# Patient Record
Sex: Female | Born: 1990 | Race: Black or African American | Hispanic: No | Marital: Single | Smoking: Never smoker
Health system: Southern US, Community
[De-identification: ages and names within clinical notes are randomized; demographics above are authoritative.]

## PROBLEM LIST (undated history)

## (undated) DIAGNOSIS — K219 Gastro-esophageal reflux disease without esophagitis: Secondary | ICD-10-CM

## (undated) DIAGNOSIS — G610 Guillain-Barre syndrome: Secondary | ICD-10-CM

## (undated) DIAGNOSIS — F32A Depression, unspecified: Secondary | ICD-10-CM

## (undated) DIAGNOSIS — F329 Major depressive disorder, single episode, unspecified: Secondary | ICD-10-CM

## (undated) DIAGNOSIS — E669 Obesity, unspecified: Secondary | ICD-10-CM

## (undated) DIAGNOSIS — F419 Anxiety disorder, unspecified: Secondary | ICD-10-CM

---

## 2009-09-04 ENCOUNTER — Emergency Department (HOSPITAL_COMMUNITY): Admission: EM | Admit: 2009-09-04 | Discharge: 2009-09-04 | Payer: Self-pay | Admitting: Emergency Medicine

## 2011-12-30 ENCOUNTER — Encounter (HOSPITAL_COMMUNITY): Payer: Self-pay | Admitting: Emergency Medicine

## 2011-12-30 ENCOUNTER — Emergency Department (HOSPITAL_COMMUNITY)
Admission: EM | Admit: 2011-12-30 | Discharge: 2011-12-30 | Disposition: A | Discharge status: Home / Self Care | Payer: Self-pay | Attending: Emergency Medicine | Admitting: Emergency Medicine

## 2011-12-30 DIAGNOSIS — J060 Acute laryngopharyngitis: Secondary | ICD-10-CM

## 2011-12-30 DIAGNOSIS — R070 Pain in throat: Secondary | ICD-10-CM | POA: Insufficient documentation

## 2011-12-30 MED ORDER — LORATADINE 10 MG PO TABS: 10.0000 mg | ORAL_TABLET | Freq: Every day | ORAL | Status: DC

## 2011-12-30 MED ORDER — DIPHENHYDRAMINE HCL 25 MG PO TABS: 50.0000 mg | ORAL_TABLET | Freq: Every evening | ORAL | Status: DC | PRN

## 2011-12-30 MED ORDER — FAMOTIDINE 20 MG PO TABS: 20.0000 mg | ORAL_TABLET | Freq: Two times a day (BID) | ORAL | Status: DC

## 2011-12-30 NOTE — ED Notes (Signed)
Pt c/o sore throat x 3 weeks; pt denies cold or fever

## 2011-12-30 NOTE — ED Provider Notes (Signed)
History  This chart was scribed for Hurman Horn, MD by Erskine Emery. This patient was seen in room TR08C/TR08C and the patient's care was started at 18:30.   CSN: [id number]  Arrival date & time 12/30/11  1539   None     Chief Complaint  Patient presents with  . Sore Throat    (Consider location/radiation/quality/duration/timing/severity/associated sxs/prior treatment) The history is provided by the patient. No language interpreter was used.  Carla Little is a 21 y.o. female who presents to the Emergency Department complaining of a burning sore throat for the past 3 weeks. Pt reports some associated difficulty swallowing, mild nasal congestion, and hoarse voice but denies any cold, fever, abdominal pain, emesis, chest pain, back pain, or extremity pain. Pt reports trying some OTC medications with no relief from symptoms. Pt reports she is otherwise healthy and on no medications.  Pt has no PCP.  History reviewed. No pertinent past medical history.  History reviewed. No pertinent past surgical history.  History reviewed. No pertinent family history.  History  Substance Use Topics  . Smoking status: Never Smoker   . Smokeless tobacco: Not on file  . Alcohol Use: No    OB History    Grav Para Term Preterm Abortions TAB SAB Ect Mult Living                  Review of Systems 10 Systems reviewed and are negative for acute change except as noted in the HPI.   Allergies  Review of patient's allergies indicates no known allergies.  Home Medications   Current Outpatient Rx  Name Route Sig Dispense Refill  . DIPHENHYDRAMINE HCL 25 MG PO TABS Oral Take 2 tablets (50 mg total) by mouth at bedtime as needed. 15 tablet 0  . FAMOTIDINE 20 MG PO TABS Oral Take 1 tablet (20 mg total) by mouth 2 (two) times daily. 30 tablet 0  . LORATADINE 10 MG PO TABS Oral Take 1 tablet (10 mg total) by mouth daily. One po daily x 15 days 15 tablet 0    BP 123/65  Pulse 84  Temp 98.1  F (36.7 C) (Oral)  Resp 16  SpO2 100%  Physical Exam  Nursing note and vitals reviewed. Constitutional:       Awake, alert, nontoxic appearance.  HENT:  Head: Atraumatic.  Mouth/Throat: Oropharynx is clear and moist. No oropharyngeal exudate.       Large tonsils that are not inflamed.   Eyes: Right eye exhibits no discharge. Left eye exhibits no discharge.  Neck: Neck supple.  Pulmonary/Chest: Effort normal and breath sounds normal. She has no wheezes. She has no rales. She exhibits no tenderness.  Abdominal: Soft. There is no tenderness. There is no rebound.  Musculoskeletal: She exhibits no tenderness.       Baseline ROM, no obvious new focal weakness.  Neurological:       Mental status and motor strength appears baseline for patient and situation.  Skin: No rash noted.  Psychiatric: She has a normal mood and affect.    ED Course  Procedures (including critical care time) DIAGNOSTIC STUDIES: Oxygen Saturation is 100% on room air, normal by my interpretation.    COORDINATION OF CARE: 18:30--Patient / Family / Caregiver informed of clinical course, understand medical decision-making process, and agree with plan.   Results for orders placed during the hospital encounter of 12/30/11  RAPID STREP SCREEN      Component Value Range   Streptococcus, Group  A Screen (Direct) NEGATIVE  NEGATIVE       MDM   Patient / Family / Caregiver informed of clinical course, understand medical decision-making process, and agree with plan. I personally performed the services described in this documentation, which was scribed in my presence. The recorded information has been reviewed and considered. I doubt any other EMC precluding discharge at this time.  Hurman Horn, MD 12/31/11 [phone]

## 2013-05-25 ENCOUNTER — Encounter (HOSPITAL_COMMUNITY): Payer: Self-pay | Admitting: Emergency Medicine

## 2013-05-25 ENCOUNTER — Emergency Department (HOSPITAL_COMMUNITY)
Admission: EM | Admit: 2013-05-25 | Discharge: 2013-05-26 | Disposition: A | Discharge status: Home / Self Care | Payer: Self-pay | Attending: Emergency Medicine | Admitting: Emergency Medicine

## 2013-05-25 DIAGNOSIS — Z8719 Personal history of other diseases of the digestive system: Secondary | ICD-10-CM | POA: Insufficient documentation

## 2013-05-25 DIAGNOSIS — S39012A Strain of muscle, fascia and tendon of lower back, initial encounter: Secondary | ICD-10-CM

## 2013-05-25 DIAGNOSIS — R296 Repeated falls: Secondary | ICD-10-CM | POA: Insufficient documentation

## 2013-05-25 DIAGNOSIS — IMO0002 Reserved for concepts with insufficient information to code with codable children: Secondary | ICD-10-CM | POA: Insufficient documentation

## 2013-05-25 DIAGNOSIS — Z79899 Other long term (current) drug therapy: Secondary | ICD-10-CM | POA: Insufficient documentation

## 2013-05-25 DIAGNOSIS — Y939 Activity, unspecified: Secondary | ICD-10-CM | POA: Insufficient documentation

## 2013-05-25 DIAGNOSIS — Y990 Civilian activity done for income or pay: Secondary | ICD-10-CM | POA: Insufficient documentation

## 2013-05-25 DIAGNOSIS — Y929 Unspecified place or not applicable: Secondary | ICD-10-CM | POA: Insufficient documentation

## 2013-05-25 DIAGNOSIS — S8000XA Contusion of unspecified knee, initial encounter: Secondary | ICD-10-CM | POA: Insufficient documentation

## 2013-05-25 HISTORY — DX: Obesity, unspecified: E66.9

## 2013-05-25 HISTORY — DX: Gastro-esophageal reflux disease without esophagitis: K21.9

## 2013-05-25 NOTE — ED Notes (Signed)
Pt states she needs to be evaluated for work.

## 2013-05-25 NOTE — ED Notes (Signed)
Pt. slipped at work [employer]( [employer]) and landed on her knees last Saturday reports mild  pain at bilateral knees . Ambulatory . No LOC .

## 2013-05-26 MED ORDER — HYDROCODONE-ACETAMINOPHEN 5-325 MG PO TABS: 1.0000 | ORAL_TABLET | ORAL | Status: DC | PRN

## 2013-05-26 MED ORDER — IBUPROFEN 800 MG PO TABS: 800.0000 mg | ORAL_TABLET | Freq: Three times a day (TID) | ORAL | Status: DC

## 2013-05-26 NOTE — Discharge Instructions (Signed)
Take vicodin as prescribed for severe pain.   Do not drive within four hours of taking this medication (may cause drowsiness or confusion).  Take up to 800mg  of ibuprofen three times a day for the next 3-4 days (take with food).  Ice 3 times a day for 15-20 minutes.  Activity as tolerated.  You should return to the ER if you develop change in or worsening of pain, fever (100.5 degrees or greater), inability to walk due to leg weakness or loss of control of bladder/bowels.

## 2013-05-26 NOTE — ED Provider Notes (Signed)
CSN: [id number]     Arrival date & time 05/25/13  2028 History   First MD Initiated Contact with Patient 05/25/13 2354     Chief Complaint  Patient presents with  . Knee Pain     (Consider location/radiation/quality/duration/timing/severity/associated sxs/prior Treatment) HPI History provided by pt.   Pt slipped at work 3 days ago and landed on her knees.  Did not hit her head.  Has had persistent "soreness" in knees as well as mid and low back ever since.  Back pain is non-radiating and no associated fever, bowel/bladder dysfunction, extremity weakness/paresthesias.  She is ambulatory.  No relief w/ 1200mg  advil.  Pertinent PMH includes morbid obesity. Past Medical History  Diagnosis Date  . Obesity   . GERD (gastroesophageal reflux disease)    History reviewed. No pertinent past surgical history. No family history on file. History  Substance Use Topics  . Smoking status: Never Smoker   . Smokeless tobacco: Not on file  . Alcohol Use: No   OB History   Grav Para Term Preterm Abortions TAB SAB Ect Mult Living                 Review of Systems  All other systems reviewed and are negative.      Allergies  Review of patient's allergies indicates no known allergies.  Home Medications   Current Outpatient Rx  Name  Route  Sig  Dispense  Refill  . ibuprofen (ADVIL,MOTRIN) 200 MG tablet   Oral   Take 1,200 mg by mouth every 6 (six) hours as needed for moderate pain.         Marland Kitchen. HYDROcodone-acetaminophen (NORCO/VICODIN) 5-325 MG per tablet   Oral   Take 1 tablet by mouth every 4 (four) hours as needed for moderate pain.   12 tablet   0   . ibuprofen (ADVIL,MOTRIN) 800 MG tablet   Oral   Take 1 tablet (800 mg total) by mouth 3 (three) times daily.   12 tablet   0    BP 135/76  Pulse 81  Temp(Src) 98 F (36.7 C) (Oral)  Resp 14  Ht 5\' 4"  (1.626 m)  Wt 350 lb (158.759 kg)  BMI 60.05 kg/m2  SpO2 100%  LMP 04/26/2013 Physical Exam  Nursing note and vitals  reviewed. Constitutional: She is oriented to person, place, and time. She appears well-developed and well-nourished.  Morbidly obese  HENT:  Head: Normocephalic and atraumatic.  Eyes:  Normal appearance  Neck: Normal range of motion.  Cardiovascular: Normal rate and regular rhythm.   Pulmonary/Chest: Effort normal and breath sounds normal.  Genitourinary:  No CVA ttp  Musculoskeletal:  Mild tendernessmid-line and bilateral thoracic and lumbar paraspinal ttp. Full active ROM of LE but pain in posterior thighs w/ knee flexion.  5/5 grip and hip abduction/adduction and ankle plantar/dorsiflexion strength.   Nml bicep and patellar reflexes.  Distal sensation intact and four extremities.  2+ radial and DP pulses.  Ambulates w/out diffulty.   Neurological: She is alert and oriented to person, place, and time.  Skin: Skin is warm and dry. No rash noted.  Psychiatric: She has a normal mood and affect. Her behavior is normal.    ED Course  Procedures (including critical care time) Labs Review Labs Reviewed - No data to display Imaging Review No results found.   EKG Interpretation None      MDM   Final diagnoses:  Back strain  Contusion of knee    Morbidly obese but  otherwise healthy 22yo F presents w/ bilateral knee as well as low back pain s/p mechanical fall onto knees 3 days ago.  Employer requested a doctor's note because she called out yesterday.  Exam most consistent w/ back strain and bilateral knee contusions; afebrile, NAD, mild mid-line and paraspinal lumbar/thoracic tenderness, no NV deficits of extremities, ambulatory.  Will treat symptomatically w/ NSAID, ice/heat, rest and short course vicodin (has not had relief w/ advil at home and pt seems very reasonable).  Return precautions discussed.     Otilio Miu, PA-C 05/26/13 [phone]

## 2013-05-26 NOTE — ED Provider Notes (Signed)
Medical screening examination/treatment/procedure(s) were performed by non-physician practitioner and as supervising physician I was immediately available for consultation/collaboration.  Yifan Auker E Early Steel, MD 05/26/13 1939 

## 2013-05-26 NOTE — ED Notes (Signed)
PA-C at bedside 

## 2013-05-26 NOTE — ED Notes (Signed)
Pt ambulated to restroom with a steady gait

## 2013-07-16 ENCOUNTER — Encounter (HOSPITAL_COMMUNITY): Payer: Self-pay | Admitting: Emergency Medicine

## 2013-07-16 ENCOUNTER — Emergency Department (HOSPITAL_COMMUNITY)
Admission: EM | Admit: 2013-07-16 | Discharge: 2013-07-16 | Disposition: A | Discharge status: Home / Self Care | Payer: Self-pay | Attending: Emergency Medicine | Admitting: Emergency Medicine

## 2013-07-16 ENCOUNTER — Emergency Department (HOSPITAL_COMMUNITY): Payer: Self-pay

## 2013-07-16 DIAGNOSIS — K219 Gastro-esophageal reflux disease without esophagitis: Secondary | ICD-10-CM | POA: Insufficient documentation

## 2013-07-16 DIAGNOSIS — M25476 Effusion, unspecified foot: Secondary | ICD-10-CM | POA: Insufficient documentation

## 2013-07-16 DIAGNOSIS — M25473 Effusion, unspecified ankle: Secondary | ICD-10-CM | POA: Insufficient documentation

## 2013-07-16 DIAGNOSIS — M79673 Pain in unspecified foot: Secondary | ICD-10-CM

## 2013-07-16 DIAGNOSIS — M25579 Pain in unspecified ankle and joints of unspecified foot: Secondary | ICD-10-CM | POA: Insufficient documentation

## 2013-07-16 MED ORDER — HYDROCODONE-ACETAMINOPHEN 5-325 MG PO TABS: 1.0000 | ORAL_TABLET | ORAL | Status: DC | PRN

## 2013-07-16 MED ORDER — IBUPROFEN 600 MG PO TABS: 600.0000 mg | ORAL_TABLET | Freq: Four times a day (QID) | ORAL | Status: DC | PRN

## 2013-07-16 NOTE — ED Provider Notes (Signed)
CSN: [id number]     Arrival date & time 07/16/13  1651 History  This chart was scribed for non-physician practitioner, Earley FavorGail Solyana Nonaka, FNP working with Shon Batonourtney F Horton, MD by Greggory StallionKayla Andersen, ED scribe. This patient was seen in room TR05C/TR05C and the patient's care was started at 5:07 PM.   Chief Complaint  Patient presents with  . Ankle Pain   The history is provided by the patient. No language interpreter was used.   HPI Comments: Carla Little is a 23 y.o. female who presents to the Emergency Department complaining of gradual onset left foot pain with associated swelling that started one week ago. Denies injury. Bearing weight worsens the pain. Pt has taken Advil, done warm and cold compresses and epsom salt soaks with no relief. She is on her feet 8-10 hours per day at work. LMP was one month ago but states she is not sexually active.   Past Medical History  Diagnosis Date  . Obesity   . GERD (gastroesophageal reflux disease)    History reviewed. No pertinent past surgical history. No family history on file. History  Substance Use Topics  . Smoking status: Never Smoker   . Smokeless tobacco: Not on file  . Alcohol Use: No   OB History   Grav Para Term Preterm Abortions TAB SAB Ect Mult Living                 Review of Systems  Musculoskeletal: Positive for arthralgias and joint swelling.  Skin: Negative for color change, rash and wound.  Neurological: Negative for numbness.  All other systems reviewed and are negative.  Allergies  Review of patient's allergies indicates no known allergies.  Home Medications   Prior to Admission medications   Medication Sig Start Date End Date Taking? Authorizing Provider  HYDROcodone-acetaminophen (NORCO/VICODIN) 5-325 MG per tablet Take 1 tablet by mouth every 4 (four) hours as needed for moderate pain. 05/26/13   Arie Sabinaatherine E Schinlever, PA-C  ibuprofen (ADVIL,MOTRIN) 200 MG tablet Take 1,200 mg by mouth every 6 (six) hours as needed  for moderate pain.    Historical Provider, MD  ibuprofen (ADVIL,MOTRIN) 800 MG tablet Take 1 tablet (800 mg total) by mouth 3 (three) times daily. 05/26/13   Arie Sabinaatherine E Schinlever, PA-C   BP 137/77  Pulse 97  Temp(Src) 97.7 F (36.5 C) (Oral)  Resp 18  Ht 5\' 4"  (1.626 m)  Wt 357 lb (161.934 kg)  BMI 61.25 kg/m2  SpO2 100%  LMP 06/15/2013  Physical Exam  Nursing note and vitals reviewed. Constitutional: She is oriented to person, place, and time. She appears well-developed and well-nourished. No distress.  HENT:  Head: Normocephalic and atraumatic.  Eyes: EOM are normal.  Neck: Neck supple. No tracheal deviation present.  Cardiovascular: Normal rate.   Pulmonary/Chest: Effort normal. No respiratory distress.  Musculoskeletal: Normal range of motion. She exhibits tenderness.  Lateral left foot tenderness and swelling. No discoloration. No deformity. No bruising.   Neurological: She is alert and oriented to person, place, and time.  Skin: Skin is warm and dry. No erythema.  Psychiatric: She has a normal mood and affect. Her behavior is normal.    ED Course  Procedures (including critical care time)  DIAGNOSTIC STUDIES: Oxygen Saturation is 100% on RA, normal by my interpretation.    COORDINATION OF CARE: 5:08 PM-Discussed treatment plan which includes xray with pt at bedside and pt agreed to plan.   Labs Review Labs Reviewed - No data to display  Imaging Review Dg Foot Complete Left  07/16/2013   CLINICAL DATA:  Ankle pain  EXAM: LEFT FOOT - COMPLETE 3+ VIEW  COMPARISON:  None.  FINDINGS: No acute fracture.  No dislocation.  IMPRESSION: No acute bony pathology.   Electronically Signed   By: Maryclare BeanArt  Hoss M.D.   On: 07/16/2013 20:10     EKG Interpretation None      MDM  Obvious site of injury.  This is a morbidly obese 23 year old female, who most likely has a stress fracture in her foot, as she works 8-12 hours a day without breaks.  She does wear a supportive sneaker.   Denies any, injury, we'll x-ray X-ray is negative for any fracture.  Vision has been given prescriptions for ibuprofen, and Vicodin for severe pain in an Ace bandage.  She has been referred to orthopedics.  If she persistently has pain after the treatment. Final diagnoses:  Foot pain      I personally performed the services described in this documentation, which was scribed in my presence. The recorded information has been reviewed and is accurate.  Arman FilterGail K Dacie Mandel, NP 07/16/13 2024

## 2013-07-16 NOTE — ED Notes (Signed)
Pt reports swelling/pain to left ankle x 1 week. Denies injury. Pt ambulatory to triage. Pulses intact.

## 2013-07-16 NOTE — Discharge Instructions (Signed)
There are no Dr. Mylo RedBehrens on your x-ray, even placed in an Ace bandage, and given the prescription strength ibuprofen, to take on a regular basis, and prescription for Vicodin and, you can take as needed.  For severe, pain.  We've also been given a referral to orthopedics.  If you have persistent pain.  Past treatment period

## 2013-07-17 NOTE — ED Provider Notes (Signed)
Medical screening examination/treatment/procedure(s) were performed by non-physician practitioner and as supervising physician I was immediately available for consultation/collaboration.   EKG Interpretation None       Shon Batonourtney F Horton, MD 07/17/13 0100

## 2013-10-27 ENCOUNTER — Emergency Department (HOSPITAL_COMMUNITY)
Admission: EM | Admit: 2013-10-27 | Discharge: 2013-10-27 | Disposition: A | Discharge status: Home / Self Care | Payer: Self-pay | Attending: Emergency Medicine | Admitting: Emergency Medicine

## 2013-10-27 ENCOUNTER — Encounter (HOSPITAL_COMMUNITY): Payer: Self-pay | Admitting: Emergency Medicine

## 2013-10-27 DIAGNOSIS — A088 Other specified intestinal infections: Secondary | ICD-10-CM | POA: Insufficient documentation

## 2013-10-27 DIAGNOSIS — A084 Viral intestinal infection, unspecified: Secondary | ICD-10-CM

## 2013-10-27 DIAGNOSIS — R109 Unspecified abdominal pain: Secondary | ICD-10-CM | POA: Insufficient documentation

## 2013-10-27 DIAGNOSIS — Z3202 Encounter for pregnancy test, result negative: Secondary | ICD-10-CM | POA: Insufficient documentation

## 2013-10-27 DIAGNOSIS — E669 Obesity, unspecified: Secondary | ICD-10-CM | POA: Insufficient documentation

## 2013-10-27 LAB — URINALYSIS, ROUTINE W REFLEX MICROSCOPIC
BILIRUBIN URINE: NEGATIVE
Glucose, UA: NEGATIVE mg/dL
HGB URINE DIPSTICK: NEGATIVE
KETONES UR: NEGATIVE mg/dL
Leukocytes, UA: NEGATIVE
Nitrite: NEGATIVE
PH: 6 (ref 5.0–8.0)
Protein, ur: NEGATIVE mg/dL
SPECIFIC GRAVITY, URINE: 1.01 (ref 1.005–1.030)
Urobilinogen, UA: 1 mg/dL (ref 0.0–1.0)

## 2013-10-27 LAB — COMPREHENSIVE METABOLIC PANEL
ALBUMIN: 3.4 g/dL — AB (ref 3.5–5.2)
ALK PHOS: 59 U/L (ref 39–117)
ALT: 9 U/L (ref 0–35)
AST: 16 U/L (ref 0–37)
Anion gap: 11 (ref 5–15)
BILIRUBIN TOTAL: 1.4 mg/dL — AB (ref 0.3–1.2)
BUN: 5 mg/dL — ABNORMAL LOW (ref 6–23)
CHLORIDE: 102 meq/L (ref 96–112)
CO2: 27 mEq/L (ref 19–32)
Calcium: 9 mg/dL (ref 8.4–10.5)
Creatinine, Ser: 0.79 mg/dL (ref 0.50–1.10)
GFR calc non Af Amer: >90 mL/min (ref >90)
GLUCOSE: 89 mg/dL (ref 70–99)
POTASSIUM: 3.3 meq/L — AB (ref 3.7–5.3)
SODIUM: 140 meq/L (ref 137–147)
TOTAL PROTEIN: 7.8 g/dL (ref 6.0–8.3)

## 2013-10-27 LAB — CBC WITH DIFFERENTIAL/PLATELET
Basophils Absolute: 0 10*3/uL (ref 0.0–0.1)
Basophils Relative: 0 % (ref 0–1)
EOS ABS: 0.1 10*3/uL (ref 0.0–0.7)
Eosinophils Relative: 1 % (ref 0–5)
HCT: 32.7 % — ABNORMAL LOW (ref 36.0–46.0)
HEMOGLOBIN: 10.4 g/dL — AB (ref 12.0–15.0)
LYMPHS ABS: 2.4 10*3/uL (ref 0.7–4.0)
LYMPHS PCT: 36 % (ref 12–46)
MCH: 27.9 pg (ref 26.0–34.0)
MCHC: 31.8 g/dL (ref 30.0–36.0)
MCV: 87.7 fL (ref 78.0–100.0)
MONOS PCT: 12 % (ref 3–12)
Monocytes Absolute: 0.8 10*3/uL (ref 0.1–1.0)
NEUTROS ABS: 3.5 10*3/uL (ref 1.7–7.7)
NEUTROS PCT: 51 % (ref 43–77)
PLATELETS: 342 10*3/uL (ref 150–400)
RBC: 3.73 MIL/uL — AB (ref 3.87–5.11)
RDW: 15.6 % — ABNORMAL HIGH (ref 11.5–15.5)
WBC: 6.8 10*3/uL (ref 4.0–10.5)

## 2013-10-27 LAB — POC URINE PREG, ED: Preg Test, Ur: NEGATIVE

## 2013-10-27 MED ORDER — PROMETHAZINE HCL 25 MG/ML IJ SOLN
12.5000 mg | Freq: Once | INTRAMUSCULAR | Status: AC | PRN
  Administered 2013-10-27: 12.5 mg via INTRAVENOUS
  Filled 2013-10-27: qty 1

## 2013-10-27 MED ORDER — SODIUM CHLORIDE 0.9 % IV BOLUS (SEPSIS)
1000.0000 mL | Freq: Once | INTRAVENOUS | Status: AC
  Administered 2013-10-27: 1000 mL via INTRAVENOUS

## 2013-10-27 MED ORDER — MORPHINE SULFATE 4 MG/ML IJ SOLN
4.0000 mg | Freq: Once | INTRAMUSCULAR | Status: AC
  Administered 2013-10-27: 4 mg via INTRAVENOUS
  Filled 2013-10-27: qty 1

## 2013-10-27 MED ORDER — POTASSIUM CHLORIDE CRYS ER 20 MEQ PO TBCR
40.0000 meq | EXTENDED_RELEASE_TABLET | Freq: Once | ORAL | Status: AC
  Administered 2013-10-27: 40 meq via ORAL
  Filled 2013-10-27: qty 2

## 2013-10-27 MED ORDER — PROMETHAZINE HCL 25 MG PO TABS: 25.0000 mg | ORAL_TABLET | Freq: Four times a day (QID) | ORAL | Status: DC | PRN

## 2013-10-27 MED ORDER — HYDROCODONE-ACETAMINOPHEN 5-325 MG PO TABS: 1.0000 | ORAL_TABLET | Freq: Four times a day (QID) | ORAL | Status: DC | PRN

## 2013-10-27 MED ORDER — ONDANSETRON HCL 4 MG/2ML IJ SOLN
4.0000 mg | INTRAMUSCULAR | Status: AC
  Administered 2013-10-27: 4 mg via INTRAVENOUS
  Filled 2013-10-27: qty 2

## 2013-10-27 NOTE — Discharge Instructions (Signed)

## 2013-10-27 NOTE — ED Notes (Signed)
Pt reports abd cramping that started last night. Reports constipation yesterday and diarrhea today. States that she has also been vomiting. Denies any urinary symptoms.

## 2013-10-27 NOTE — ED Provider Notes (Signed)
CSN: [id number]     Arrival date & time 10/27/13  1625 History   First MD Initiated Contact with Patient 10/27/13 2033     Chief Complaint  Patient presents with  . Abdominal Pain     (Consider location/radiation/quality/duration/timing/severity/associated sxs/prior Treatment) HPI Comments: Patient is a 23 year old female with a history of morbid obesity who presents to the emergency department for nausea and vomiting. Patient states that symptoms began yesterday. Since this time patient has had approximately 7 episodes of nonbloody, nonbilious emesis. She states that her nausea has been constant. Patient endorses developing diarrhea today, having approximately 2 episodes. Symptoms also associated with generalized, diffuse, cramping abdominal pain which has spontaneously improved over the course of the day. Abdominal pain is aggravated with emesis. Patient states that her last episode of emesis was 30 minutes prior to my evaluation. She states that, as a result of her illness, she has felt very fatigued. She denies sick contacts, but states she works in [employer] and may have come into contact with someone who was sick. Patient denies associated fever, chest pain, shortness of breath, urinary symptoms, vaginal complaints, numbness, and extremity weakness. She denies a history of abdominal surgeries.  Patient is a 23 y.o. female presenting with abdominal pain. The history is provided by the patient. No language interpreter was used.  Abdominal Pain Associated symptoms: diarrhea, nausea and vomiting     Past Medical History  Diagnosis Date  . Obesity   . GERD (gastroesophageal reflux disease)    History reviewed. No pertinent past surgical history. History reviewed. No pertinent family history. History  Substance Use Topics  . Smoking status: Never Smoker   . Smokeless tobacco: Not on file  . Alcohol Use: No   OB History   Grav Para Term Preterm Abortions TAB SAB Ect Mult Living                   Review of Systems  Gastrointestinal: Positive for nausea, vomiting, abdominal pain and diarrhea.  All other systems reviewed and are negative.    Allergies  Review of patient's allergies indicates no known allergies.  Home Medications   Prior to Admission medications   Medication Sig Start Date End Date Taking? Authorizing Provider  ibuprofen (ADVIL,MOTRIN) 200 MG tablet Take 800 mg by mouth 2 (two) times daily as needed for moderate pain.    Yes Historical Provider, MD  HYDROcodone-acetaminophen (NORCO/VICODIN) 5-325 MG per tablet Take 1-2 tablets by mouth every 6 (six) hours as needed for moderate pain or severe pain. 10/27/13   Antony Madura, PA-C  promethazine (PHENERGAN) 25 MG tablet Take 1 tablet (25 mg total) by mouth every 6 (six) hours as needed for nausea or vomiting. 10/27/13   Antony Madura, PA-C   BP 100/55  Pulse 68  Temp(Src) 98.4 F (36.9 C) (Oral)  Resp 16  Ht 5\' 4"  (1.626 m)  Wt 357 lb (161.934 kg)  BMI 61.25 kg/m2  SpO2 100%  LMP 09/28/2013  Physical Exam  Nursing note and vitals reviewed. Constitutional: She is oriented to person, place, and time. She appears well-developed and well-nourished. No distress.  Nontoxic/nonseptic appearing  HENT:  Head: Normocephalic and atraumatic.  Eyes: Conjunctivae and EOM are normal. No scleral icterus.  Neck: Normal range of motion.  Cardiovascular: Normal rate, regular rhythm and normal heart sounds.   Pulmonary/Chest: Effort normal and breath sounds normal. No respiratory distress. She has no wheezes. She has no rales.  Chest expansion symmetric without tachypnea or  dyspnea.  Abdominal: Soft. She exhibits no mass. There is tenderness. There is no rebound and no guarding.  Soft morbidly obese abdomen with diffuse tenderness. No focal tenderness or peritoneal signs.  Musculoskeletal: Normal range of motion.  Neurological: She is alert and oriented to person, place, and time. She exhibits normal muscle  tone. Coordination normal.  GCS 15. Patient moves extremities without ataxia.  Skin: Skin is warm and dry. No rash noted. She is not diaphoretic. No erythema. No pallor.  Psychiatric: She has a normal mood and affect. Her behavior is normal.    ED Course  Procedures (including critical care time) Labs Review Labs Reviewed  CBC WITH DIFFERENTIAL - Abnormal; Notable for the following:    RBC 3.73 (*)    Hemoglobin 10.4 (*)    HCT 32.7 (*)    RDW 15.6 (*)    All other components within normal limits  COMPREHENSIVE METABOLIC PANEL - Abnormal; Notable for the following:    Potassium 3.3 (*)    BUN 5 (*)    Albumin 3.4 (*)    Total Bilirubin 1.4 (*)    All other components within normal limits  URINALYSIS, ROUTINE W REFLEX MICROSCOPIC  POC URINE PREG, ED    Imaging Review No results found.   EKG Interpretation None      MDM   Final diagnoses:  Viral gastroenteritis    23 year old female presents to the emergency department for symptoms consistent with viral gastroenteritis. Patient well and nontoxic appearing and hemodynamically stable, and afebrile. Patient has no leukocytosis today. No elevated LFTs to suggest acute cholecystitis. Negative Murphy sign on exam. No tenderness to palpation at McBurney's point or leukocytosis to suggest appendicitis. Doubt SBO or pSBO given regular BMs. Urinalysis does not suggest infection. Urine pregnancy negative. Abdominal exam significant for mild diffuse tenderness on deep palpation without peritoneal signs. This remains stable over ED course. Do not believe further emergent workup with imaging is indicated.   Patient treated in ED with antiemetics, pain medicine, and IV fluids with improvement in symptoms. She is able to tolerate fluids without emesis. Patient stable and appropriate for discharge with prescriptions for symptomatic management. Return precautions discussed and provided. Patient agreeable to plan with no unaddressed  concerns.   Filed Vitals:   10/27/13 1703 10/27/13 2138  BP: 129/73 100/55  Pulse: 83 68  Temp: 98.4 F (36.9 C) 98.4 F (36.9 C)  TempSrc: Oral Oral  Resp: 18 16  Height: 5\' 4"  (1.626 m)   Weight: 357 lb (161.934 kg)   SpO2: 99% 100%     Antony MaduraKelly Rivky Clendenning, PA-C 10/27/13 2251

## 2013-10-28 NOTE — ED Provider Notes (Signed)
Medical screening examination/treatment/procedure(s) were performed by non-physician practitioner and as supervising physician I was immediately available for consultation/collaboration.   EKG Interpretation None       Derwood KaplanAnkit Yanelle Sousa, MD 10/28/13 [phone]

## 2015-06-14 ENCOUNTER — Encounter (HOSPITAL_COMMUNITY): Payer: Self-pay

## 2015-06-14 ENCOUNTER — Emergency Department (HOSPITAL_COMMUNITY)
Admission: EM | Admit: 2015-06-14 | Discharge: 2015-06-14 | Disposition: A | Discharge status: Home / Self Care | Payer: Self-pay | Attending: Emergency Medicine | Admitting: Emergency Medicine

## 2015-06-14 DIAGNOSIS — Z8719 Personal history of other diseases of the digestive system: Secondary | ICD-10-CM | POA: Insufficient documentation

## 2015-06-14 DIAGNOSIS — Z3202 Encounter for pregnancy test, result negative: Secondary | ICD-10-CM | POA: Insufficient documentation

## 2015-06-14 DIAGNOSIS — R300 Dysuria: Secondary | ICD-10-CM

## 2015-06-14 DIAGNOSIS — A5901 Trichomonal vulvovaginitis: Secondary | ICD-10-CM | POA: Insufficient documentation

## 2015-06-14 LAB — URINALYSIS, ROUTINE W REFLEX MICROSCOPIC
GLUCOSE, UA: NEGATIVE mg/dL
KETONES UR: 15 mg/dL — AB
LEUKOCYTES UA: NEGATIVE
NITRITE: NEGATIVE
PROTEIN: NEGATIVE mg/dL
Specific Gravity, Urine: 1.026 (ref 1.005–1.030)
pH: 6 (ref 5.0–8.0)

## 2015-06-14 LAB — WET PREP, GENITAL
Sperm: NONE SEEN
Yeast Wet Prep HPF POC: NONE SEEN

## 2015-06-14 LAB — COMPREHENSIVE METABOLIC PANEL
ALK PHOS: 51 U/L (ref 38–126)
ALT: 18 U/L (ref 14–54)
AST: 34 U/L (ref 15–41)
Albumin: 3 g/dL — ABNORMAL LOW (ref 3.5–5.0)
Anion gap: 8 (ref 5–15)
BILIRUBIN TOTAL: 0.7 mg/dL (ref 0.3–1.2)
BUN: 7 mg/dL (ref 6–20)
CO2: 27 mmol/L (ref 22–32)
CREATININE: 0.74 mg/dL (ref 0.44–1.00)
Calcium: 8.9 mg/dL (ref 8.9–10.3)
Chloride: 105 mmol/L (ref 101–111)
GFR calc Af Amer: >60 mL/min (ref >60)
GLUCOSE: 107 mg/dL — AB (ref 65–99)
Potassium: 3.4 mmol/L — ABNORMAL LOW (ref 3.5–5.1)
Sodium: 140 mmol/L (ref 135–145)
TOTAL PROTEIN: 7 g/dL (ref 6.5–8.1)

## 2015-06-14 LAB — CBC
HCT: 35.9 % — ABNORMAL LOW (ref 36.0–46.0)
Hemoglobin: 11.4 g/dL — ABNORMAL LOW (ref 12.0–15.0)
MCH: 28.2 pg (ref 26.0–34.0)
MCHC: 31.8 g/dL (ref 30.0–36.0)
MCV: 88.9 fL (ref 78.0–100.0)
PLATELETS: 313 10*3/uL (ref 150–400)
RBC: 4.04 MIL/uL (ref 3.87–5.11)
RDW: 14.6 % (ref 11.5–15.5)
WBC: 5.2 10*3/uL (ref 4.0–10.5)

## 2015-06-14 LAB — LIPASE, BLOOD: Lipase: 16 U/L (ref 11–51)

## 2015-06-14 LAB — URINE MICROSCOPIC-ADD ON

## 2015-06-14 LAB — I-STAT BETA HCG BLOOD, ED (MC, WL, AP ONLY): I-stat hCG, quantitative: <5 m[IU]/mL (ref <5)

## 2015-06-14 MED ORDER — METRONIDAZOLE 500 MG PO TABS: 500.0000 mg | ORAL_TABLET | Freq: Two times a day (BID) | ORAL | Status: DC

## 2015-06-14 MED ORDER — AZITHROMYCIN 250 MG PO TABS
1000.0000 mg | ORAL_TABLET | Freq: Once | ORAL | Status: AC
  Administered 2015-06-14: 1000 mg via ORAL
  Filled 2015-06-14: qty 4

## 2015-06-14 NOTE — ED Notes (Addendum)
Pt reports onset 06-11-15 had dysuria, none since.  On 06-12-15  Pt had chills x 2 hours, none since.  Onset yesterday urinary frequency, bilateral flank pain and abd pain  NO blood in urine.

## 2015-06-14 NOTE — Discharge Instructions (Signed)
Be sure your sex partner is treated for the trichomoniasis because men often do not have symptoms and if not treated will re infect you. Follow up with the health department.

## 2015-06-14 NOTE — ED Provider Notes (Signed)
CSN: [id number]     Arrival date & time 06/14/15  1739 History  By signing my name below, I, Carla Little, attest that this documentation has been prepared under the direction and in the presence of Kerrie Buffalo, NP Electronically Signed: Soijett Little, ED Scribe. 06/14/2015. 8:28 PM.   Chief Complaint  Patient presents with  . Flank Pain  . Abdominal Pain     Patient is a 25 y.o. female presenting with flank pain. The history is provided by the patient. No language interpreter was used.  Flank Pain This is a new problem. The current episode started more than 2 days ago. The problem occurs rarely. The problem has not changed since onset.Associated symptoms include abdominal pain. Nothing aggravates the symptoms. Nothing relieves the symptoms. She has tried nothing for the symptoms.    HPI Comments: Carla Little is a 25 y.o. female who presents to the Emergency Department complaining of bilateral flank pain onset 3 days. Pt notes that she last had sexual encounter 6 months ago and she is not sexually active at this time. Pt is unsure if she has a STD at this time. She states that she is having associated symptoms of fatigue, vaginal discharge, abdominal pain, back pain, and chills.  She states that she has not tried any medications for the relief for her symptoms. She denies fever, hematuria, and any other symptoms.   Past Medical History  Diagnosis Date  . Obesity   . GERD (gastroesophageal reflux disease)    History reviewed. No pertinent past surgical history. History reviewed. No pertinent family history. Social History  Substance Use Topics  . Smoking status: Never Smoker   . Smokeless tobacco: None  . Alcohol Use: No   OB History    No data available     Review of Systems  Constitutional: Positive for chills and fatigue. Negative for fever.  Gastrointestinal: Positive for abdominal pain.  Genitourinary: Positive for dysuria, flank pain, vaginal bleeding and vaginal  discharge. Negative for urgency and frequency.  Musculoskeletal: Positive for back pain.  All other systems reviewed and are negative.     Allergies  Review of patient's allergies indicates no known allergies.  Home Medications   Prior to Admission medications   Medication Sig Start Date End Date Taking? Authorizing Provider  HYDROcodone-acetaminophen (NORCO/VICODIN) 5-325 MG per tablet Take 1-2 tablets by mouth every 6 (six) hours as needed for moderate pain or severe pain. 10/27/13   Antony Madura, PA-C  ibuprofen (ADVIL,MOTRIN) 200 MG tablet Take 800 mg by mouth 2 (two) times daily as needed for moderate pain.     Historical Provider, MD  metroNIDAZOLE (FLAGYL) 500 MG tablet Take 1 tablet (500 mg total) by mouth 2 (two) times daily. 06/14/15   Taniqua Issa Orlene Och, NP  promethazine (PHENERGAN) 25 MG tablet Take 1 tablet (25 mg total) by mouth every 6 (six) hours as needed for nausea or vomiting. 10/27/13   Antony Madura, PA-C   BP 100/62 mmHg  Pulse 98  Temp(Src) 98.9 F (37.2 C) (Oral)  Resp 18  SpO2 100%  LMP 05/17/2015 Physical Exam  Constitutional: She is oriented to person, place, and time. No distress.  Morbidly obese  HENT:  Head: Normocephalic and atraumatic.  Right Ear: Tympanic membrane, external ear and ear canal normal.  Left Ear: Tympanic membrane, external ear and ear canal normal.  Mouth/Throat: Uvula is midline, oropharynx is clear and moist and mucous membranes are normal. No posterior oropharyngeal edema or posterior oropharyngeal erythema.  Eyes: EOM are normal.  Neck: Neck supple.  Cardiovascular: Normal rate, regular rhythm and normal heart sounds.   Pulmonary/Chest: Effort normal and breath sounds normal.  Abdominal: Soft. Bowel sounds are normal. There is no tenderness. There is no CVA tenderness.  Genitourinary:  Chaperone present for exam External genitalia without lesions, small blood vaginal vault. No CMT, no adnexal tenderness, unable to palpate uterus due to  patient habitus.   Musculoskeletal: Normal range of motion.  Mild right lumbar paraspinal tenderness.   Neurological: She is alert and oriented to person, place, and time.  Skin: Skin is warm and dry.  Psychiatric: She has a normal mood and affect. Her behavior is normal.  Nursing note and vitals reviewed.   ED Course  Procedures (including critical care time) DIAGNOSTIC STUDIES: Oxygen Saturation is 100% on RA, nl by my interpretation.    COORDINATION OF CARE: 8:22 PM Discussed treatment plan with pt at bedside which includes labs, UA, pelvic and pt agreed to plan.    Labs Review Labs Reviewed  WET PREP, GENITAL - Abnormal; Notable for the following:    Trich, Wet Prep PRESENT (*)    Clue Cells Wet Prep HPF POC PRESENT (*)    WBC, Wet Prep HPF POC FEW (*)    All other components within normal limits  COMPREHENSIVE METABOLIC PANEL - Abnormal; Notable for the following:    Potassium 3.4 (*)    Glucose, Bld 107 (*)    Albumin 3.0 (*)    All other components within normal limits  CBC - Abnormal; Notable for the following:    Hemoglobin 11.4 (*)    HCT 35.9 (*)    All other components within normal limits  URINALYSIS, ROUTINE W REFLEX MICROSCOPIC (NOT AT [facility]) - Abnormal; Notable for the following:    Color, Urine AMBER (*)    APPearance CLOUDY (*)    Hgb urine dipstick MODERATE (*)    Bilirubin Urine SMALL (*)    Ketones, ur 15 (*)    All other components within normal limits  URINE MICROSCOPIC-ADD ON - Abnormal; Notable for the following:    Squamous Epithelial / LPF 0-5 (*)    Bacteria, UA MANY (*)    All other components within normal limits  LIPASE, BLOOD  I-STAT BETA HCG BLOOD, ED ([facility], [facility], [facility] ONLY)  GC/CHLAMYDIA PROBE AMP ([place]) NOT AT [facility]    Imaging Review No results found. I have personally reviewed and evaluated the lab results as part of my medical decision-making.   MDM  25 y.o. female with dysuria, vaginal d/c and itching with pelvic pain  stable for d/c without fever and does not appear toxic. Will treat for trichomonas and will give Zithromax to cover possible Chlamydia since patient has dysuria without UTI. Discussed with the patient and all questioned fully answered. She will  Tell her sex partner of need for treatment and she will f/u with the [facility]. [facility].    Final diagnoses:  Trichomonal vaginitis  Dysuria   I personally performed the services described in this documentation, which was scribed in my presence. The recorded information has been reviewed and is accurate.   [address]Hope DanwoodM Jonel Weldon, [place] 06/15/15 [phone]  Rolland PorterMark James, MD 06/22/15 [phone]

## 2015-06-15 LAB — GC/CHLAMYDIA PROBE AMP (~~LOC~~) NOT AT ARMC
Chlamydia: NEGATIVE
NEISSERIA GONORRHEA: NEGATIVE

## 2017-01-14 DIAGNOSIS — N3000 Acute cystitis without hematuria: Secondary | ICD-10-CM | POA: Diagnosis present

## 2017-01-14 DIAGNOSIS — L03319 Cellulitis of trunk, unspecified: Secondary | ICD-10-CM | POA: Insufficient documentation

## 2017-01-14 DIAGNOSIS — E876 Hypokalemia: Secondary | ICD-10-CM | POA: Diagnosis present

## 2017-06-08 DIAGNOSIS — R4182 Altered mental status, unspecified: Secondary | ICD-10-CM | POA: Insufficient documentation

## 2017-06-08 DIAGNOSIS — K7682 Hepatic encephalopathy: Secondary | ICD-10-CM | POA: Insufficient documentation

## 2017-06-08 DIAGNOSIS — J189 Pneumonia, unspecified organism: Secondary | ICD-10-CM | POA: Insufficient documentation

## 2018-01-10 ENCOUNTER — Emergency Department (HOSPITAL_COMMUNITY)
Admission: EM | Admit: 2018-01-10 | Discharge: 2018-01-11 | Disposition: A | Discharge status: Home / Self Care | Payer: Self-pay | Attending: Emergency Medicine | Admitting: Emergency Medicine

## 2018-01-10 ENCOUNTER — Other Ambulatory Visit: Payer: Self-pay

## 2018-01-10 ENCOUNTER — Encounter (HOSPITAL_COMMUNITY): Payer: Self-pay | Admitting: Emergency Medicine

## 2018-01-10 DIAGNOSIS — R112 Nausea with vomiting, unspecified: Secondary | ICD-10-CM | POA: Insufficient documentation

## 2018-01-10 DIAGNOSIS — R197 Diarrhea, unspecified: Secondary | ICD-10-CM | POA: Insufficient documentation

## 2018-01-10 DIAGNOSIS — Z79899 Other long term (current) drug therapy: Secondary | ICD-10-CM | POA: Insufficient documentation

## 2018-01-10 LAB — CBC
HCT: 38.5 % (ref 36.0–46.0)
HEMOGLOBIN: 11.7 g/dL — AB (ref 12.0–15.0)
MCH: 27 pg (ref 26.0–34.0)
MCHC: 30.4 g/dL (ref 30.0–36.0)
MCV: 88.9 fL (ref 80.0–100.0)
Platelets: 436 10*3/uL — ABNORMAL HIGH (ref 150–400)
RBC: 4.33 MIL/uL (ref 3.87–5.11)
RDW: 16.3 % — ABNORMAL HIGH (ref 11.5–15.5)
WBC: 9.5 10*3/uL (ref 4.0–10.5)
nRBC: 0 % (ref 0.0–0.2)

## 2018-01-10 LAB — COMPREHENSIVE METABOLIC PANEL
ALBUMIN: 3.4 g/dL — AB (ref 3.5–5.0)
ALK PHOS: 68 U/L (ref 38–126)
ALT: 18 U/L (ref 0–44)
ANION GAP: 9 (ref 5–15)
AST: 27 U/L (ref 15–41)
BILIRUBIN TOTAL: 0.8 mg/dL (ref 0.3–1.2)
BUN: 8 mg/dL (ref 6–20)
CO2: 27 mmol/L (ref 22–32)
Calcium: 9.4 mg/dL (ref 8.9–10.3)
Chloride: 105 mmol/L (ref 98–111)
Creatinine, Ser: 0.8 mg/dL (ref 0.44–1.00)
GLUCOSE: 95 mg/dL (ref 70–99)
Potassium: 3.4 mmol/L — ABNORMAL LOW (ref 3.5–5.1)
Sodium: 141 mmol/L (ref 135–145)
TOTAL PROTEIN: 8.2 g/dL — AB (ref 6.5–8.1)

## 2018-01-10 LAB — URINALYSIS, ROUTINE W REFLEX MICROSCOPIC
BILIRUBIN URINE: NEGATIVE
Glucose, UA: NEGATIVE mg/dL
HGB URINE DIPSTICK: NEGATIVE
KETONES UR: 5 mg/dL — AB
NITRITE: NEGATIVE
PROTEIN: 30 mg/dL — AB
Specific Gravity, Urine: 1.033 — ABNORMAL HIGH (ref 1.005–1.030)
pH: 5 (ref 5.0–8.0)

## 2018-01-10 LAB — I-STAT BETA HCG BLOOD, ED (MC, WL, AP ONLY): I-stat hCG, quantitative: <5 m[IU]/mL (ref <5)

## 2018-01-10 LAB — LIPASE, BLOOD: Lipase: 24 U/L (ref 11–51)

## 2018-01-10 MED ORDER — FAMOTIDINE IN NACL 20-0.9 MG/50ML-% IV SOLN
20.0000 mg | Freq: Once | INTRAVENOUS | Status: AC
  Administered 2018-01-11: 20 mg via INTRAVENOUS
  Filled 2018-01-10: qty 50

## 2018-01-10 MED ORDER — ONDANSETRON HCL 4 MG/2ML IJ SOLN
4.0000 mg | Freq: Once | INTRAMUSCULAR | Status: AC
  Administered 2018-01-11: 4 mg via INTRAVENOUS
  Filled 2018-01-10: qty 2

## 2018-01-10 MED ORDER — SODIUM CHLORIDE 0.9 % IV BOLUS
1000.0000 mL | Freq: Once | INTRAVENOUS | Status: AC
  Administered 2018-01-11: 1000 mL via INTRAVENOUS

## 2018-01-10 NOTE — ED Provider Notes (Signed)
[facility] EMERGENCY DEPARTMENT Provider Note   CSN: [id number] Arrival date & time: 01/10/18  1954     History   Chief Complaint Chief Complaint  Patient presents with  . Abdominal Pain  . Emesis  . Diarrhea    HPI Carla Little is a 27 y.o. female with a history of GERD and obesity who presents to the emergency department with a chief complaint of abdominal pain.  The patient endorses sudden onset, constant epigastric pain that is nonradiating, onset this morning with associated nonbloody, nonbilious emesis x2-3 and green, non-bloody diarrhea x4-5.  She reports that she ordered it take out from a fast food restaurant for dinner while she was at work last night and is concerned that the food may have made her ill.  She reports that several other coworkers also ordered food, but because she did not go to work today because she was feeling ill she is unsure if they also had symptoms.  She denies fever, chills, constipation, chest pain, dyspnea, back pain, flank pain, dysuria, hematuria, urinary frequency or hesitancy, vaginal pain, itching, or discharge, melena, hematochezia, or hematemesis.  No treatment prior to arrival.  LMP was 01/03/2018 and she reports that she just finished her menstrual cycle.  She denies recent alcohol use.  She reports minimal NSAID use.  She denies other illicit or recreational drugs.  The history is provided by the patient. No language interpreter was used.    Past Medical History:  Diagnosis Date  . GERD (gastroesophageal reflux disease)   . Obesity     There are no active problems to display for this patient.   History reviewed. No pertinent surgical history.   OB History   None      Home Medications    Prior to Admission medications   Medication Sig Start Date End Date Taking? Authorizing Provider  HYDROcodone-acetaminophen (NORCO/VICODIN) 5-325 MG per tablet Take 1-2 tablets by mouth every 6 (six) hours as  needed for moderate pain or severe pain. 10/27/13   Antony Madura, PA-C  ibuprofen (ADVIL,MOTRIN) 200 MG tablet Take 800 mg by mouth 2 (two) times daily as needed for moderate pain.     [provider]  metroNIDAZOLE (FLAGYL) 500 MG tablet Take 1 tablet (500 mg total) by mouth 2 (two) times daily. 06/14/15   Janne Napoleon, NP  ondansetron (ZOFRAN ODT) 4 MG disintegrating tablet Take 1 tablet (4 mg total) by mouth every 8 (eight) hours as needed for nausea or vomiting. 01/11/18   Joahan Swatzell A, PA-C  promethazine (PHENERGAN) 25 MG tablet Take 1 tablet (25 mg total) by mouth every 6 (six) hours as needed for nausea or vomiting. 10/27/13   Antony Madura, PA-C    Family History No family history on file.  Social History Social History   Tobacco Use  . Smoking status: Never Smoker  . Smokeless tobacco: Never Used  Substance Use Topics  . Alcohol use: Yes  . Drug use: No     Allergies   Patient has no known allergies.   Review of Systems Review of Systems  Constitutional: Negative for activity change, chills and fever.  Respiratory: Negative for shortness of breath.   Cardiovascular: Negative for chest pain.  Gastrointestinal: Positive for abdominal pain, diarrhea, nausea and vomiting.  Genitourinary: Negative for dysuria, flank pain, hematuria, urgency, vaginal bleeding, vaginal discharge and vaginal pain.  Musculoskeletal: Negative for back pain.  Skin: Negative for rash.  Allergic/Immunologic: Negative for immunocompromised state.  Neurological: Negative for weakness, numbness and headaches.  Psychiatric/Behavioral: Negative for confusion.   Physical Exam Updated Vital Signs BP 114/65   Pulse 90   Temp 98.9 F (37.2 C) (Oral)   Resp 16   LMP 01/03/2018   SpO2 100%   Physical Exam  Constitutional: No distress.  HENT:  Head: Normocephalic.  Eyes: Conjunctivae are normal.  Neck: Neck supple.  Cardiovascular: Normal rate, regular rhythm, normal heart sounds and  intact distal pulses. Exam reveals no gallop and no friction rub.  No murmur heard. Pulmonary/Chest: Effort normal. No stridor. No respiratory distress. She has no wheezes. She has no rales. She exhibits no tenderness.  Abdominal: Soft. Bowel sounds are normal. She exhibits no distension and no mass. There is tenderness. There is no rebound and no guarding. No hernia.  Bowel sounds are mildly hyperactive.  Tender to palpation in the epigastric region without rebound or guarding.  Negative Murphy sign.  No tenderness over McBurney's point.  No CVA tenderness bilaterally.  Exam is somewhat limited secondary to body habitus.  Abdomen is soft, nondistended.  Neurological: She is alert.  Skin: Skin is warm. No rash noted.  Psychiatric: Her behavior is normal.  Nursing note and vitals reviewed.    ED Treatments / Results  Labs (all labs ordered are listed, but only abnormal results are displayed) Labs Reviewed  COMPREHENSIVE METABOLIC PANEL - Abnormal; Notable for the following components:      Result Value   Potassium 3.4 (*)    Total Protein 8.2 (*)    Albumin 3.4 (*)    All other components within normal limits  CBC - Abnormal; Notable for the following components:   Hemoglobin 11.7 (*)    RDW 16.3 (*)    Platelets 436 (*)    All other components within normal limits  URINALYSIS, ROUTINE W REFLEX MICROSCOPIC - Abnormal; Notable for the following components:   APPearance CLOUDY (*)    Specific Gravity, Urine 1.033 (*)    Ketones, ur 5 (*)    Protein, ur 30 (*)    Leukocytes, UA TRACE (*)    Bacteria, UA RARE (*)    All other components within normal limits  LIPASE, BLOOD  I-STAT BETA HCG BLOOD, ED ([facility], [facility], [facility] ONLY)    EKG EKG Interpretation  Date/Time:  Friday January 10 2018 20:25:56 EDT Ventricular Rate:  104 PR Interval:  134 QRS Duration: 84 QT Interval:  330 QTC Calculation: 433 R Axis:   36 Text Interpretation:  Sinus tachycardia Nonspecific T wave abnormality  Abnormal ECG No old tracing to compare Confirmed by Dione Booze ([id number]) on 01/10/2018 11:05:07 PM   Radiology No results found.  Procedures Procedures (including critical care time)  Medications Ordered in ED Medications  sodium chloride 0.9 % bolus 1,000 mL (0 mLs Intravenous Stopped 01/11/18 0120)  ondansetron (ZOFRAN) injection 4 mg (4 mg Intravenous Given 01/11/18 0028)  famotidine (PEPCID) IVPB 20 mg premix (0 mg Intravenous Stopped 01/11/18 0120)     Initial Impression / Assessment and Plan / ED Course  I have reviewed the triage vital signs and the nursing notes.  Pertinent labs & imaging results that were available during my care of the patient were reviewed by me and considered in my medical decision making (see chart for details).     27 year old female presenting with epigastric abdominal pain, nausea, vomiting, and diarrhea, onset this morning.  No constitutional symptoms or genitourinary symptoms.  Test is negative.  Mild hypokalemia at 3.4.  Hemoglobin is 11.7, which appears to be the patient's baseline.  UA with elevated specific gravity, mild ketonuria, and mild leukocyte esterase.  The patient is having no urinary symptoms doubt cystitis or pyelonephritis.  Calcium oxalate crystals are also noted on UA.  However, given the patient's clinical picture, low suspicion for obstructive uropathy.  Will treat with IV fluid bolus, Zofran, Pepcid, and fluid challenge.  Patient does not meet the SIRS or Sepsis criteria.  On repeat exam patient does not have a surgical abdomen and there are no peritoneal signs.  No indication of appendicitis, bowel obstruction, bowel perforation, cholecystitis, diverticulitis, PID or ectopic pregnancy.  Patient discharged home with symptomatic treatment and given strict instructions for follow-up with their primary care physician.  I have also discussed reasons to return immediately to the ER.  Patient expresses understanding and agrees with plan.     Final Clinical Impressions(s) / ED Diagnoses   Final diagnoses:  Nausea vomiting and diarrhea    ED Discharge Orders         Ordered    ondansetron (ZOFRAN ODT) 4 MG disintegrating tablet  Every 8 hours PRN     01/11/18 0107           Tarahji Ramthun, Pedro Earls A, PA-C 01/11/18 1610    Dione Booze, MD 01/11/18 [phone]

## 2018-01-10 NOTE — ED Triage Notes (Addendum)
C/o mid upper abd pain, nausea, vomiting, and diarrhea since this morning.  Denies urinary complaint.

## 2018-01-11 MED ORDER — ONDANSETRON 4 MG PO TBDP: 4.0000 mg | ORAL_TABLET | Freq: Three times a day (TID) | ORAL | 0 refills | Status: DC | PRN

## 2018-01-11 NOTE — Discharge Instructions (Signed)
Thank you for allowing me to care for you today in the Emergency Department.   Your work-up today was reassuring and imaging of your abdomen was not indicated at this time.  To treat your symptoms at home, let 1 tablet of Zofran dissolve under your tongue every 8 hours as needed for nausea or vomiting.  It is important to stay hydrated if you continue to have diarrhea to avoid dehydration.  Continue to drink plenty of drinks with electrolytes and water.  Avoid taking any antidiarrheal medications.  You can call to get established with a primary care provider if your diarrhea does not resolve in the next 4 to 5 days.  You can take 650 mg of Tylenol every 6 hours for pain control.  I would recommend avoiding ibuprofen or naproxen because this may make your stomach discomfort worse.  Return to the emergency department if you develop new or worsening symptoms including severe pain in her side, blood in your urine, high fever, severe, uncontrollable abdominal pain, or other new, concerning symptoms.

## 2018-05-16 ENCOUNTER — Encounter (HOSPITAL_COMMUNITY): Payer: Self-pay

## 2018-05-16 ENCOUNTER — Emergency Department (HOSPITAL_COMMUNITY): Payer: Self-pay

## 2018-05-16 ENCOUNTER — Other Ambulatory Visit: Payer: Self-pay

## 2018-05-16 ENCOUNTER — Emergency Department (HOSPITAL_COMMUNITY)
Admission: EM | Admit: 2018-05-16 | Discharge: 2018-05-17 | Disposition: A | Discharge status: Other Acute Inpatient Hospital | Payer: Self-pay | Attending: Physician Assistant | Admitting: Physician Assistant

## 2018-05-16 DIAGNOSIS — F32A Depression, unspecified: Secondary | ICD-10-CM

## 2018-05-16 DIAGNOSIS — R45851 Suicidal ideations: Secondary | ICD-10-CM | POA: Insufficient documentation

## 2018-05-16 DIAGNOSIS — F332 Major depressive disorder, recurrent severe without psychotic features: Secondary | ICD-10-CM | POA: Insufficient documentation

## 2018-05-16 DIAGNOSIS — F329 Major depressive disorder, single episode, unspecified: Secondary | ICD-10-CM

## 2018-05-16 DIAGNOSIS — Z599 Problem related to housing and economic circumstances, unspecified: Secondary | ICD-10-CM | POA: Insufficient documentation

## 2018-05-16 HISTORY — DX: Depression, unspecified: F32.A

## 2018-05-16 HISTORY — DX: Major depressive disorder, single episode, unspecified: F32.9

## 2018-05-16 LAB — COMPREHENSIVE METABOLIC PANEL
ALBUMIN: 3.7 g/dL (ref 3.5–5.0)
ALT: 16 U/L (ref 0–44)
AST: 30 U/L (ref 15–41)
Alkaline Phosphatase: 63 U/L (ref 38–126)
Anion gap: 10 (ref 5–15)
BILIRUBIN TOTAL: 1.1 mg/dL (ref 0.3–1.2)
BUN: 8 mg/dL (ref 6–20)
CO2: 28 mmol/L (ref 22–32)
Calcium: 9.4 mg/dL (ref 8.9–10.3)
Chloride: 103 mmol/L (ref 98–111)
Creatinine, Ser: 0.83 mg/dL (ref 0.44–1.00)
GFR calc Af Amer: >60 mL/min (ref >60)
GFR calc non Af Amer: >60 mL/min (ref >60)
GLUCOSE: 103 mg/dL — AB (ref 70–99)
POTASSIUM: 2.9 mmol/L — AB (ref 3.5–5.1)
Sodium: 141 mmol/L (ref 135–145)
TOTAL PROTEIN: 8.9 g/dL — AB (ref 6.5–8.1)

## 2018-05-16 LAB — CBC
HCT: 39.9 % (ref 36.0–46.0)
Hemoglobin: 12.8 g/dL (ref 12.0–15.0)
MCH: 28.9 pg (ref 26.0–34.0)
MCHC: 32.1 g/dL (ref 30.0–36.0)
MCV: 90.1 fL (ref 80.0–100.0)
Platelets: 432 10*3/uL — ABNORMAL HIGH (ref 150–400)
RBC: 4.43 MIL/uL (ref 3.87–5.11)
RDW: 15.1 % (ref 11.5–15.5)
WBC: 7.5 10*3/uL (ref 4.0–10.5)
nRBC: 0 % (ref 0.0–0.2)

## 2018-05-16 LAB — I-STAT BETA HCG BLOOD, ED (MC, WL, AP ONLY)

## 2018-05-16 LAB — ETHANOL

## 2018-05-16 LAB — ACETAMINOPHEN LEVEL

## 2018-05-16 LAB — SALICYLATE LEVEL: Salicylate Lvl: <7 mg/dL (ref 2.8–30.0)

## 2018-05-16 MED ORDER — POTASSIUM CHLORIDE CRYS ER 20 MEQ PO TBCR
40.0000 meq | EXTENDED_RELEASE_TABLET | Freq: Once | ORAL | Status: AC
  Administered 2018-05-16: 40 meq via ORAL
  Filled 2018-05-16: qty 2

## 2018-05-16 NOTE — ED Triage Notes (Signed)
Pt reports suicidal thoughts for the past week, no specific plan. Pt calm and cooperative in triage. Denies HI, no AH/VH, denies drug or alcohol abuse.

## 2018-05-16 NOTE — ED Notes (Signed)
Patient placed in gown due to no correct size for patient; Strings from gown cut out for safety concerns due to [facility]
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
## 2018-05-16 NOTE — ED Provider Notes (Signed)
[facility] EMERGENCY DEPARTMENT Provider Note   CSN: [id number] Arrival date & time: 05/16/18  1513    History   Chief Complaint Chief Complaint  Patient presents with  . Suicidal    HPI Carla Little is a 28 y.o. female who presents today for evaluation of suicidal thoughts.  She reports that she has had suicidal thoughts for a long time however they have been becoming more frequent recently.  She denies any specific plan, says she has never done anything to harm herself in the past.  She denies HI or AVH.  She says that she was talking with 1 of her friends on Facebook and told them that she would be okay if she went to sleep and did not wake up.  They called the police who brought her here.  She tells me that every morning when she wakes up she is disappointed that she woke up.  She notes significant life stresses.  She tells me that she is an only child.  She has had many family members die.  She tells me that her uncle passed away in her arms before she was 82.  Her mother died when she was 29 leaving her with her aunt.  Her aunt, who she says is like a second mother to her, was sick for a long time and she says that she took over care for her aunt at the age of 28 after her mother's death including managing colostomies and feeding tubes and taking her to appointments.  She says that her father was never involved in her life and she currently has no remaining family members.  She says that when she thinks of killing her self she feels bad because she knows it will put the pain that she feels from grief onto those who care about her the most and she does not want to do that to them.    She states that she is living in a house that is condemned.  She does not have running water.  She lives with a friend who is helping to pay electricity bills.  Patient does not work, she says that her friend will bring bottled water in for them to drink however she has a difficult time  maintaining personal hygiene, and does not have warm water.  She says that she is normally able to get food to eat, often from food pantry is however there are times where she will go multiple days without eating.  She says that when she was in second grade she got talk therapy through school however aside from that has never seen a counselor or a psychiatrist.  She has never been placed on any psychiatric medications.  She denies any drug or alcohol abuse.  She admits that she smoked marijuana with her last use with being 7 to 8 months ago.    She reports that since Tuesday her chest has been feeling slightly tight.  She reports that she attributes this to anxiety as when her chest normally feels this way it is from anxiety.  She states that on Tuesday she made a mistake and attempted to sell the condemned house however her cousin is also a part owner on the house.  She was unable to sell it and reports that there was "a lot of drama."  She denies any shortness of breath.  She does not take any birth control.  No recent coughs, fever, nasal congestion or illness.  HPI  Past Medical History:  Diagnosis Date  . Depression   . GERD (gastroesophageal reflux disease)   . Obesity     There are no active problems to display for this patient.   History reviewed. No pertinent surgical history.   OB History   No obstetric history on file.      Home Medications    Prior to Admission medications   Medication Sig Start Date End Date Taking? Authorizing Provider  ibuprofen (ADVIL,MOTRIN) 200 MG tablet Take 400 mg by mouth 2 (two) times daily as needed for headache, moderate pain or cramping.    Yes [provider]  HYDROcodone-acetaminophen (NORCO/VICODIN) 5-325 MG per tablet Take 1-2 tablets by mouth every 6 (six) hours as needed for moderate pain or severe pain. Patient not taking: Reported on 05/16/2018 10/27/13   Antony Madura, PA-C  ondansetron (ZOFRAN ODT) 4 MG disintegrating  tablet Take 1 tablet (4 mg total) by mouth every 8 (eight) hours as needed for nausea or vomiting. Patient not taking: Reported on 05/16/2018 01/11/18   McDonald, Pedro Earls A, PA-C  promethazine (PHENERGAN) 25 MG tablet Take 1 tablet (25 mg total) by mouth every 6 (six) hours as needed for nausea or vomiting. Patient not taking: Reported on 05/16/2018 10/27/13   Antony Madura, PA-C    Family History No family history on file.  Social History Social History   Tobacco Use  . Smoking status: Never Smoker  . Smokeless tobacco: Never Used  Substance Use Topics  . Alcohol use: Yes  . Drug use: No     Allergies   Azithromycin   Review of Systems Review of Systems  Constitutional: Negative for chills, diaphoresis and fever.  HENT: Negative for congestion, drooling, hearing loss, postnasal drip, rhinorrhea, sinus pressure, sinus pain, sore throat and trouble swallowing.   Respiratory: Positive for chest tightness. Negative for shortness of breath.   Cardiovascular: Negative for chest pain, palpitations and leg swelling.  Gastrointestinal: Negative for abdominal pain, diarrhea, nausea and vomiting.  Musculoskeletal: Negative for back pain.  Neurological: Negative for weakness, numbness and headaches.  Psychiatric/Behavioral: Positive for dysphoric mood and suicidal ideas. Negative for confusion and self-injury. The patient is not nervous/anxious.   All other systems reviewed and are negative.    Physical Exam Updated Vital Signs BP 126/85 (BP Location: Right Arm)   Pulse 89   Temp 98.2 F (36.8 C) (Oral)   Resp 16   SpO2 100%   Physical Exam Vitals signs and nursing note reviewed.  Constitutional:      General: She is not in acute distress.    Appearance: She is well-developed. She is obese.     Comments: Disheveled, unkempt, malodorous  HENT:     Head: Normocephalic and atraumatic.     Nose: Nose normal.     Mouth/Throat:     Mouth: Mucous membranes are moist.  Eyes:      Extraocular Movements: Extraocular movements intact.     Conjunctiva/sclera: Conjunctivae normal.     Pupils: Pupils are equal, round, and reactive to light.  Neck:     Musculoskeletal: Normal range of motion and neck supple. No neck rigidity or muscular tenderness.  Cardiovascular:     Rate and Rhythm: Normal rate and regular rhythm.     Heart sounds: Normal heart sounds. No murmur.  Pulmonary:     Effort: Pulmonary effort is normal. No respiratory distress.     Breath sounds: Normal breath sounds. No stridor.  Abdominal:  General: There is no distension.     Palpations: Abdomen is soft.     Tenderness: There is no abdominal tenderness.  Musculoskeletal:     Right lower leg: No edema.     Left lower leg: No edema.  Skin:    General: Skin is warm and dry.  Neurological:     General: No focal deficit present.     Mental Status: She is alert and oriented to person, place, and time. Mental status is at baseline.  Psychiatric:        Attention and Perception: Attention normal.        Mood and Affect: Mood is not anxious. Affect is blunt and flat.        Speech: Speech normal.        Behavior: Behavior is withdrawn.        Thought Content: Thought content is not paranoid or delusional. Thought content includes suicidal ideation. Thought content does not include homicidal ideation. Thought content does not include homicidal or suicidal plan.        Cognition and Memory: Cognition and memory normal.      ED Treatments / Results  Labs (all labs ordered are listed, but only abnormal results are displayed) Labs Reviewed  COMPREHENSIVE METABOLIC PANEL - Abnormal; Notable for the following components:      Result Value   Potassium 2.9 (*)    Glucose, Bld 103 (*)    Total Protein 8.9 (*)    All other components within normal limits  ACETAMINOPHEN LEVEL - Abnormal; Notable for the following components:   Acetaminophen (Tylenol), Serum <10 (*)    All other components within normal  limits  CBC - Abnormal; Notable for the following components:   Platelets 432 (*)    All other components within normal limits  ETHANOL  SALICYLATE LEVEL  RAPID URINE DRUG SCREEN, HOSP PERFORMED  TSH  I-STAT BETA HCG BLOOD, ED ([facility], [facility], [facility] ONLY)    EKG None  Radiology Dg Chest 2 View  Result Date: 05/16/2018 CLINICAL DATA:  Patient is suicidal. EXAM: CHEST - 2 VIEW COMPARISON:  June 08, 2017 FINDINGS: The heart size and mediastinal contours are within normal limits. Both lungs are clear. The visualized skeletal structures are unremarkable. IMPRESSION: No active cardiopulmonary disease. Electronically Signed   By: Sherian ReinWei-Chen  Lin M.D.   On: 05/16/2018 16:26    Procedures Procedures (including critical care time)  Medications Ordered in ED Medications  potassium chloride SA (K-DUR,KLOR-CON) CR tablet 40 mEq (40 mEq Oral Given 05/16/18 1854)     Initial Impression / Assessment and Plan / ED Course  I have reviewed the triage vital signs and the nursing notes.  Pertinent labs & imaging results that were available during my care of the patient were reviewed by me and considered in my medical decision making (see chart for details).       Patient is a 28 year old woman who presents today for evaluation of suicidal ideation.  She reports to me that she wishes to die however does not wish to harm herself.  She details many life stressors including lack of support system, living in a condemned house without running water, unemployment, death of multiple close family members, and food insecurity.  CXR normal, Perc negative, EKG with out acute ischemia.    She is currently here voluntarily.  She denies any drug or alcohol abuse.  She is medically clear for psychiatric admission.   Final Clinical Impressions(s) / ED Diagnoses  Final diagnoses:  Suicidal ideation  Depression, unspecified depression type  Problem related to housing and economic circumstances    ED Discharge  Orders    None       Norman Clay 05/16/18 2051    Gwyneth Sprout, MD 05/21/18 2102

## 2018-05-16 NOTE — Progress Notes (Signed)
Pt meets inpatient criteria per Nira Conn, NP. Referral information has been sent to the following hospitals for review: [facility]  [facility]  [facility]  [facility]  [facility]  [facility]  [facility]  [facility]   Disposition will continue to assist with inpatient placement needs.   Wells Guiles, LCSW, LCAS Disposition CSW [facility]/TTS [phone]

## 2018-05-16 NOTE — ED Notes (Signed)
RN attempted to draw blood; [place] to draw TSH; TTS now in process-Monique,RN

## 2018-05-17 LAB — RAPID URINE DRUG SCREEN, HOSP PERFORMED
AMPHETAMINES: NOT DETECTED
BENZODIAZEPINES: NOT DETECTED
Barbiturates: NOT DETECTED
Cocaine: NOT DETECTED
Opiates: NOT DETECTED
TETRAHYDROCANNABINOL: NOT DETECTED

## 2018-05-17 LAB — TSH: TSH: 2.756 u[IU]/mL (ref 0.350–4.500)

## 2018-05-17 NOTE — Discharge Instructions (Addendum)
Transfer to [facility] 

## 2018-05-17 NOTE — ED Notes (Signed)
Pt voiced understanding and agreement w/tx plan - accepted to [facility]. Pt requested to call her aunt to advise of tx plan. Advised pt she may do so when phone is not being used.

## 2018-05-17 NOTE — ED Notes (Signed)
ALL belongings - 4 labeled belongings bags and 1 Valuables Envelope - [place] - Pt aware.

## 2018-05-17 NOTE — ED Provider Notes (Signed)
BH team indicates Carla Little accepted to [facility].   Patient is alert, content, nad.  Carla Little currently appears stable for transfer.    [name], MD 05/17/18 1007

## 2018-05-17 NOTE — ED Notes (Signed)
Patient was given a Snack and drink. 

## 2018-05-17 NOTE — ED Notes (Signed)
Pt on phone w/her aunt, Thayer Ohm - advising of tx plan.

## 2018-05-17 NOTE — ED Notes (Signed)
Attempted to call report to [facility] - RN will return call.

## 2018-05-17 NOTE — BHH Counselor (Signed)
Received call from [place] at [facility] who said Pt has been accepted to their [facility] by Dr. Roselyn Reef and can be transported after 1000. Number for RN report is [phone]. Notified Adonis Brook, RN of acceptance.   Pamalee Leyden, [facility], [facility], [facility] Triage Specialist [phone]

## 2020-10-31 ENCOUNTER — Emergency Department (HOSPITAL_COMMUNITY): Payer: Medicaid Other

## 2020-10-31 ENCOUNTER — Encounter (HOSPITAL_COMMUNITY): Payer: Self-pay

## 2020-10-31 ENCOUNTER — Inpatient Hospital Stay (HOSPITAL_COMMUNITY)
Admission: EM | Admit: 2020-10-31 | Discharge: 2020-11-28 | DRG: 562 | Disposition: A | Discharge status: Home Health | Payer: Medicaid Other | Attending: Family Medicine | Admitting: Family Medicine

## 2020-10-31 DIAGNOSIS — K2100 Gastro-esophageal reflux disease with esophagitis, without bleeding: Secondary | ICD-10-CM | POA: Diagnosis present

## 2020-10-31 DIAGNOSIS — Z20822 Contact with and (suspected) exposure to covid-19: Secondary | ICD-10-CM | POA: Diagnosis present

## 2020-10-31 DIAGNOSIS — Z9114 Patient's other noncompliance with medication regimen: Secondary | ICD-10-CM

## 2020-10-31 DIAGNOSIS — S83412A Sprain of medial collateral ligament of left knee, initial encounter: Secondary | ICD-10-CM | POA: Diagnosis present

## 2020-10-31 DIAGNOSIS — F32A Depression, unspecified: Secondary | ICD-10-CM | POA: Diagnosis present

## 2020-10-31 DIAGNOSIS — E871 Hypo-osmolality and hyponatremia: Secondary | ICD-10-CM | POA: Diagnosis present

## 2020-10-31 DIAGNOSIS — K259 Gastric ulcer, unspecified as acute or chronic, without hemorrhage or perforation: Secondary | ICD-10-CM | POA: Diagnosis present

## 2020-10-31 DIAGNOSIS — R1310 Dysphagia, unspecified: Secondary | ICD-10-CM

## 2020-10-31 DIAGNOSIS — R109 Unspecified abdominal pain: Secondary | ICD-10-CM

## 2020-10-31 DIAGNOSIS — S83105D Unspecified dislocation of left knee, subsequent encounter: Secondary | ICD-10-CM

## 2020-10-31 DIAGNOSIS — S83005A Unspecified dislocation of left patella, initial encounter: Secondary | ICD-10-CM | POA: Diagnosis present

## 2020-10-31 DIAGNOSIS — S83105A Unspecified dislocation of left knee, initial encounter: Principal | ICD-10-CM | POA: Diagnosis present

## 2020-10-31 DIAGNOSIS — E876 Hypokalemia: Secondary | ICD-10-CM | POA: Diagnosis present

## 2020-10-31 DIAGNOSIS — E0781 Sick-euthyroid syndrome: Secondary | ICD-10-CM | POA: Diagnosis present

## 2020-10-31 DIAGNOSIS — Z6841 Body Mass Index (BMI) 40.0 and over, adult: Secondary | ICD-10-CM

## 2020-10-31 DIAGNOSIS — Z881 Allergy status to other antibiotic agents status: Secondary | ICD-10-CM

## 2020-10-31 DIAGNOSIS — L8990 Pressure ulcer of unspecified site, unspecified stage: Secondary | ICD-10-CM | POA: Insufficient documentation

## 2020-10-31 DIAGNOSIS — S82492A Other fracture of shaft of left fibula, initial encounter for closed fracture: Secondary | ICD-10-CM | POA: Diagnosis present

## 2020-10-31 DIAGNOSIS — L89312 Pressure ulcer of right buttock, stage 2: Secondary | ICD-10-CM

## 2020-10-31 DIAGNOSIS — B964 Proteus (mirabilis) (morganii) as the cause of diseases classified elsewhere: Secondary | ICD-10-CM | POA: Diagnosis not present

## 2020-10-31 DIAGNOSIS — S83419A Sprain of medial collateral ligament of unspecified knee, initial encounter: Secondary | ICD-10-CM | POA: Diagnosis present

## 2020-10-31 DIAGNOSIS — Z5329 Procedure and treatment not carried out because of patient's decision for other reasons: Secondary | ICD-10-CM | POA: Diagnosis not present

## 2020-10-31 DIAGNOSIS — N390 Urinary tract infection, site not specified: Secondary | ICD-10-CM | POA: Diagnosis not present

## 2020-10-31 DIAGNOSIS — E559 Vitamin D deficiency, unspecified: Secondary | ICD-10-CM | POA: Diagnosis present

## 2020-10-31 DIAGNOSIS — F419 Anxiety disorder, unspecified: Secondary | ICD-10-CM | POA: Diagnosis present

## 2020-10-31 DIAGNOSIS — D75839 Thrombocytosis, unspecified: Secondary | ICD-10-CM | POA: Diagnosis present

## 2020-10-31 DIAGNOSIS — K8020 Calculus of gallbladder without cholecystitis without obstruction: Secondary | ICD-10-CM

## 2020-10-31 DIAGNOSIS — R739 Hyperglycemia, unspecified: Secondary | ICD-10-CM | POA: Diagnosis present

## 2020-10-31 DIAGNOSIS — S8992XD Unspecified injury of left lower leg, subsequent encounter: Secondary | ICD-10-CM

## 2020-10-31 DIAGNOSIS — D509 Iron deficiency anemia, unspecified: Secondary | ICD-10-CM | POA: Diagnosis present

## 2020-10-31 DIAGNOSIS — J9601 Acute respiratory failure with hypoxia: Secondary | ICD-10-CM | POA: Diagnosis present

## 2020-10-31 DIAGNOSIS — W1839XA Other fall on same level, initial encounter: Secondary | ICD-10-CM | POA: Diagnosis present

## 2020-10-31 DIAGNOSIS — K5900 Constipation, unspecified: Secondary | ICD-10-CM

## 2020-10-31 DIAGNOSIS — S83529A Sprain of posterior cruciate ligament of unspecified knee, initial encounter: Secondary | ICD-10-CM | POA: Diagnosis present

## 2020-10-31 DIAGNOSIS — W19XXXA Unspecified fall, initial encounter: Secondary | ICD-10-CM

## 2020-10-31 DIAGNOSIS — S83519A Sprain of anterior cruciate ligament of unspecified knee, initial encounter: Secondary | ICD-10-CM | POA: Diagnosis present

## 2020-10-31 DIAGNOSIS — R000 Tachycardia, unspecified: Secondary | ICD-10-CM | POA: Diagnosis present

## 2020-10-31 DIAGNOSIS — T148XXA Other injury of unspecified body region, initial encounter: Secondary | ICD-10-CM

## 2020-10-31 MED ORDER — HYDROMORPHONE HCL 1 MG/ML IJ SOLN
1.0000 mg | Freq: Once | INTRAMUSCULAR | Status: AC
Start: 2020-10-31 — End: 2020-11-01
  Administered 2020-11-01: 1 mg via INTRAVENOUS
  Filled 2020-10-31: qty 1

## 2020-10-31 MED ORDER — SODIUM CHLORIDE 0.9 % IV BOLUS
1000.0000 mL | Freq: Once | INTRAVENOUS | Status: AC
Start: 2020-10-31 — End: 2020-11-01
  Administered 2020-11-01: 1000 mL via INTRAVENOUS

## 2020-10-31 NOTE — ED Triage Notes (Signed)
Patient arrives via EMS after a mechanical fall that occurred when she got out of bed to use the bathroom. Patient reports hearing her left leg snap and falling to the floor.  Pt did not hit head, no LOC. Pt was on the floor about 20 mins before EMS arrival. Pt reports pain 10/10 in the left knee that radiates through the left leg. Internal rotation noted. 100 mcg of fentanyl given en route, 20 g LFA. Not on blood thinners.  EMS vitals: BP 143/78 HR 110  SPO2 94% RA RR 20

## 2020-10-31 NOTE — ED Provider Notes (Addendum)
[facility]-EMERGENCY DEPT [facility] Emergency Department Provider Note MRN:  [id number]  Arrival date & time: 11/01/20     Chief Complaint   Leg Pain   History of Present Illness   Carla Little is a 30 y.o. year-old female with a history of morbid obesity presenting to the ED with chief complaint of fall.  Patient was walking and stepped awkwardly with the left leg and experienced a snapping sensation and severe pain to the leg.  The entire leg hurts but is worst in the knee area.  Unable to walk since the event.  Denies any head trauma, no loss of consciousness, no neck or back pain, no chest pain or shortness of breath, no abdominal pain.  Review of Systems  A complete 10 system review of systems was obtained and all systems are negative except as noted in the HPI and PMH.   Patient's Health History    Past Medical History:  Diagnosis Date   Depression    GERD (gastroesophageal reflux disease)    Obesity     History reviewed. No pertinent surgical history.  History reviewed. No pertinent family history.  Social History   Socioeconomic History   Marital status: Single    Spouse name: Not on file   Number of children: Not on file   Years of education: Not on file   Highest education level: Not on file  Occupational History   Not on file  Tobacco Use   Smoking status: Never   Smokeless tobacco: Never  Substance and Sexual Activity   Alcohol use: Yes   Drug use: No   Sexual activity: Not on file  Other Topics Concern   Not on file  Social History Narrative   Not on file   Social Determinants of Health   Financial Resource Strain: Not on file  Food Insecurity: Not on file  Transportation Needs: Not on file  Physical Activity: Not on file  Stress: Not on file  Social Connections: Not on file  Intimate Partner Violence: Not on file     Physical Exam   Vitals:   11/01/20 0500 11/01/20 0600  BP: (!) 128/93 129/88  Pulse: (!) 118 (!) 118  Resp: (!) 24  19  Temp:    SpO2: 100% 99%    CONSTITUTIONAL: Well-appearing, NAD NEURO:  Alert and oriented x 3, no focal deficits EYES:  eyes equal and reactive ENT/NECK:  no LAD, no JVD CARDIO: Regular rate, well-perfused, normal S1 and S2 PULM:  CTAB no wheezing or rhonchi GI/GU:  normal bowel sounds, non-distended, non-tender MSK/SPINE:  No gross deformities, no edema, diffuse tenderness to palpation to the left leg, DP pulses present SKIN:  no rash, atraumatic PSYCH:  Appropriate speech and behavior  *Additional and/or pertinent findings included in MDM below  Diagnostic and Interventional Summary    EKG Interpretation  Date/Time:  11-01-2020 at 03: 16: 51 Ventricular Rate:  117 PR Interval:  135 QRS Duration: 82 QT Interval: 314   QTC Calculation: 438   R Axis:     Text Interpretation: Sinus tachycardia, normal intervals Confirm by Dr. Kennis Carina at 3:20 AM       Labs Reviewed  CBC - Abnormal; Notable for the following components:      Result Value   WBC 12.4 (*)    RBC 3.76 (*)    Hemoglobin 8.9 (*)    HCT 30.1 (*)    MCH 23.7 (*)    MCHC 29.6 (*)  RDW 20.6 (*)    Platelets 577 (*)    All other components within normal limits  BASIC METABOLIC PANEL - Abnormal; Notable for the following components:   Potassium 3.4 (*)    Glucose, Bld 107 (*)    Calcium 8.7 (*)    All other components within normal limits  HCG, QUANTITATIVE, PREGNANCY  I-STAT BETA HCG BLOOD, ED ([facility], [facility], [facility] ONLY)    CT ANGIO LOW EXTREM LEFT W &/OR WO CONTRAST  Final Result    DG Knee Left Port  Final Result    DG Femur Min 2 Views Left  Final Result    DG Tibia/Fibula Left  Final Result    DG Foot 2 Views Left  Final Result    MR KNEE LEFT W WO CONTRAST    (Results Pending)    Medications  sodium chloride 0.9 % bolus 1,000 mL (0 mLs Intravenous Stopped 11/01/20 0248)  HYDROmorphone (DILAUDID) injection 1 mg (1 mg Intravenous Given 11/01/20 0035)  ketamine 50 mg in normal saline 5 mL (10  mg/mL) syringe (80 mg Intravenous Given 11/01/20 0227)  ketamine (KETALAR) injection (80 mg Intravenous Given 11/01/20 0227)  HYDROmorphone (DILAUDID) injection 1 mg (1 mg Intravenous Given 11/01/20 0306)  iohexol (OMNIPAQUE) 350 MG/ML injection 100 mL (100 mLs Intravenous Contrast Given 11/01/20 0324)  sodium chloride 0.9 % bolus 1,000 mL (1,000 mLs Intravenous New Bag/Given 11/01/20 0611)  oxyCODONE (Oxy IR/ROXICODONE) immediate release tablet 5 mg (5 mg Oral Given 11/01/20 0630)     Procedures  /  Critical Care .Sedation  Date/Time: 11/01/2020 2:37 AM Performed by: Sabas SousBero, Morris Little M, MD Authorized by: Sabas SousBero, Stepehn Eckard M, MD   Consent:    Consent obtained:  Verbal and written   Consent given by:  Patient   Risks discussed:  Allergic reaction, dysrhythmia, inadequate sedation, nausea, vomiting, respiratory compromise necessitating ventilatory assistance and intubation and prolonged hypoxia resulting in organ damage Universal protocol:    Immediately prior to procedure, a time out was called: yes     Patient identity confirmed:  Verbally with patient and arm band Indications:    Procedure performed:  Dislocation reduction   Procedure necessitating sedation performed by:  Physician performing sedation Pre-sedation assessment:    Time since last food or drink:  4 hours   ASA classification: class 1 - normal, healthy patient     Mouth opening:  2 finger widths   Mallampati score:  II - soft palate, uvula, fauces visible   Neck mobility: reduced     Pre-sedation assessments completed and reviewed: airway patency, cardiovascular function, hydration status, mental status, nausea/vomiting, pain level, respiratory function and temperature   Immediate pre-procedure details:    Reassessment: Patient reassessed immediately prior to procedure     Reviewed: vital signs, relevant labs/tests and NPO status     Verified: bag valve mask available, emergency equipment available, intubation equipment available, IV  patency confirmed, oxygen available and suction available   Procedure details (see MAR for exact dosages):    Preoxygenation:  Nasal cannula   Sedation:  Ketamine   Intended level of sedation: deep   Analgesia:  Hydromorphone   Intra-procedure monitoring:  Blood pressure monitoring, cardiac monitor, continuous pulse oximetry, continuous capnometry, frequent LOC assessments and frequent vital sign checks   Intra-procedure events: none     Total Provider sedation time (minutes):  22 Post-procedure details:    Attendance: Constant attendance by certified staff until patient recovered     Recovery: Patient returned to pre-procedure  baseline     Post-sedation assessments completed and reviewed: airway patency, cardiovascular function, hydration status, mental status, nausea/vomiting, pain level, respiratory function and temperature     Patient is stable for discharge or admission: yes     Procedure completion:  Tolerated well, no immediate complications Reduction of dislocation  Date/Time: 11/01/2020 2:38 AM Performed by: Sabas Sous, MD Authorized by: Sabas Sous, MD  Consent: Verbal consent obtained. Written consent obtained. Risks and benefits: risks, benefits and alternatives were discussed Consent given by: patient Patient understanding: patient states understanding of the procedure being performed Patient consent: the patient's understanding of the procedure matches consent given Imaging studies: imaging studies available Patient identity confirmed: verbally with patient, arm band and provided demographic data Time out: Immediately prior to procedure a "time out" was called to verify the correct patient, procedure, equipment, support staff and site/side marked as required.  Sedation: Patient sedated: yes  Patient tolerance: patient tolerated the procedure well with no immediate complications Comments: Reduction of left knee dislocation    ED Course and Medical Decision  Making  I have reviewed the triage vital signs, the nursing notes, and pertinent available records from the EMR.  Listed above are laboratory and imaging tests that I personally ordered, reviewed, and interpreted and then considered in my medical decision making (see below for details).  Difficult exam due to body habitus, will begin with x-rays to evaluate for acute fracture.  Will consider CTA to exclude vascular injury in the setting of transient knee dislocation given patient's morbid obesity.  We will especially consider this if x-rays do not show any reason for patient's severe pain.     X-rays confirm an anterior dislocation of the tib-fib with respect to the femur.  Some delay in obtaining IV access for sedation.  Multiple neurovascular checks were performed, patient continued to have strong DP pulse with normal strength and sensation.  See procedural details above.  Post procedure patient continues to have a strong DP pulse, no neurological deficits to the foot, can move the foot without issue.  CTA is unfortunately nondiagnostic for vascular injury, some issue with timing of the contrast despite 2 attempts.  No gross abnormalities and the exam is reassuring.  Discussed the case with Dr. Ophelia Charter of orthopedics, given the patient's multiple comminuted fractures as well as the dislocation, she needs MRI and possibly surgery for ligamentous repair.  Patient may need placement to a rehab or skilled nursing facility thereafter.  Given the continued pain, tachycardia, need for repeated neurovascular checks, will admit to the hospitalist service.  7 AM update: Discussed case with Dr. Ronaldo Miyamoto of hospitalist service.  Does not seem to be true indication for medicine admission.  Recommending follow-up MRI and touching base again with orthopedics to see if they will admit.  Signed out to oncoming provider at shift change.  Elmer Sow. Pilar Plate, MD [facility] Emergency Medicine [facility]  Health [email or web address]  Final Clinical Impressions(s) / ED Diagnoses     ICD-10-CM   1. Dislocation of left knee, initial encounter  S83.105A     2. Fall  [email or web address].Lorne Skeens DG Foot 2 Views Left    DG Foot 2 Views Left    3. Dislocation closed  T14.8XXA DG Knee Left Port    DG Knee Left Port      ED Discharge Orders     None        Discharge Instructions Discussed with and Provided to Patient:   Discharge Instructions  None       Sabas Sous, MD 11/01/20 6226    Sabas Sous, MD 11/01/20 [phone]

## 2020-11-01 ENCOUNTER — Emergency Department (HOSPITAL_COMMUNITY): Payer: Medicaid Other

## 2020-11-01 ENCOUNTER — Inpatient Hospital Stay (HOSPITAL_COMMUNITY): Payer: Medicaid Other

## 2020-11-01 DIAGNOSIS — S83105A Unspecified dislocation of left knee, initial encounter: Secondary | ICD-10-CM

## 2020-11-01 DIAGNOSIS — E876 Hypokalemia: Secondary | ICD-10-CM | POA: Diagnosis present

## 2020-11-01 DIAGNOSIS — R1011 Right upper quadrant pain: Secondary | ICD-10-CM | POA: Diagnosis not present

## 2020-11-01 DIAGNOSIS — E559 Vitamin D deficiency, unspecified: Secondary | ICD-10-CM | POA: Diagnosis present

## 2020-11-01 DIAGNOSIS — S83519A Sprain of anterior cruciate ligament of unspecified knee, initial encounter: Secondary | ICD-10-CM | POA: Diagnosis present

## 2020-11-01 DIAGNOSIS — F32A Depression, unspecified: Secondary | ICD-10-CM | POA: Diagnosis present

## 2020-11-01 DIAGNOSIS — S83529A Sprain of posterior cruciate ligament of unspecified knee, initial encounter: Secondary | ICD-10-CM | POA: Diagnosis present

## 2020-11-01 DIAGNOSIS — M79605 Pain in left leg: Secondary | ICD-10-CM | POA: Diagnosis not present

## 2020-11-01 DIAGNOSIS — W1839XA Other fall on same level, initial encounter: Secondary | ICD-10-CM | POA: Diagnosis present

## 2020-11-01 DIAGNOSIS — S83522A Sprain of posterior cruciate ligament of left knee, initial encounter: Secondary | ICD-10-CM | POA: Diagnosis not present

## 2020-11-01 DIAGNOSIS — T148XXA Other injury of unspecified body region, initial encounter: Secondary | ICD-10-CM | POA: Diagnosis not present

## 2020-11-01 DIAGNOSIS — J9601 Acute respiratory failure with hypoxia: Secondary | ICD-10-CM | POA: Diagnosis present

## 2020-11-01 DIAGNOSIS — K2100 Gastro-esophageal reflux disease with esophagitis, without bleeding: Secondary | ICD-10-CM | POA: Diagnosis present

## 2020-11-01 DIAGNOSIS — D75839 Thrombocytosis, unspecified: Secondary | ICD-10-CM | POA: Diagnosis present

## 2020-11-01 DIAGNOSIS — Z20822 Contact with and (suspected) exposure to covid-19: Secondary | ICD-10-CM | POA: Diagnosis present

## 2020-11-01 DIAGNOSIS — S83412A Sprain of medial collateral ligament of left knee, initial encounter: Secondary | ICD-10-CM | POA: Diagnosis present

## 2020-11-01 DIAGNOSIS — K5901 Slow transit constipation: Secondary | ICD-10-CM | POA: Diagnosis not present

## 2020-11-01 DIAGNOSIS — B964 Proteus (mirabilis) (morganii) as the cause of diseases classified elsewhere: Secondary | ICD-10-CM | POA: Diagnosis not present

## 2020-11-01 DIAGNOSIS — K259 Gastric ulcer, unspecified as acute or chronic, without hemorrhage or perforation: Secondary | ICD-10-CM | POA: Diagnosis present

## 2020-11-01 DIAGNOSIS — S83105D Unspecified dislocation of left knee, subsequent encounter: Secondary | ICD-10-CM | POA: Diagnosis not present

## 2020-11-01 DIAGNOSIS — Z6841 Body Mass Index (BMI) 40.0 and over, adult: Secondary | ICD-10-CM | POA: Diagnosis not present

## 2020-11-01 DIAGNOSIS — S83005A Unspecified dislocation of left patella, initial encounter: Secondary | ICD-10-CM | POA: Diagnosis present

## 2020-11-01 DIAGNOSIS — F419 Anxiety disorder, unspecified: Secondary | ICD-10-CM | POA: Diagnosis present

## 2020-11-01 DIAGNOSIS — R739 Hyperglycemia, unspecified: Secondary | ICD-10-CM | POA: Diagnosis present

## 2020-11-01 DIAGNOSIS — R300 Dysuria: Secondary | ICD-10-CM | POA: Diagnosis not present

## 2020-11-01 DIAGNOSIS — K8000 Calculus of gallbladder with acute cholecystitis without obstruction: Secondary | ICD-10-CM | POA: Diagnosis not present

## 2020-11-01 DIAGNOSIS — M238X9 Other internal derangements of unspecified knee: Secondary | ICD-10-CM | POA: Diagnosis not present

## 2020-11-01 DIAGNOSIS — E871 Hypo-osmolality and hyponatremia: Secondary | ICD-10-CM | POA: Diagnosis present

## 2020-11-01 DIAGNOSIS — E0781 Sick-euthyroid syndrome: Secondary | ICD-10-CM | POA: Diagnosis present

## 2020-11-01 DIAGNOSIS — S82492A Other fracture of shaft of left fibula, initial encounter for closed fracture: Secondary | ICD-10-CM | POA: Diagnosis present

## 2020-11-01 DIAGNOSIS — D509 Iron deficiency anemia, unspecified: Secondary | ICD-10-CM | POA: Diagnosis present

## 2020-11-01 DIAGNOSIS — N390 Urinary tract infection, site not specified: Secondary | ICD-10-CM | POA: Diagnosis not present

## 2020-11-01 DIAGNOSIS — L89312 Pressure ulcer of right buttock, stage 2: Secondary | ICD-10-CM | POA: Diagnosis not present

## 2020-11-01 DIAGNOSIS — R1013 Epigastric pain: Secondary | ICD-10-CM | POA: Diagnosis not present

## 2020-11-01 DIAGNOSIS — K5900 Constipation, unspecified: Secondary | ICD-10-CM | POA: Diagnosis not present

## 2020-11-01 LAB — RESP PANEL BY RT-PCR (FLU A&B, COVID) ARPGX2
Influenza A by PCR: NEGATIVE
Influenza B by PCR: NEGATIVE
SARS Coronavirus 2 by RT PCR: NEGATIVE

## 2020-11-01 LAB — BASIC METABOLIC PANEL
Anion gap: 9 (ref 5–15)
BUN: 10 mg/dL (ref 6–20)
CO2: 27 mmol/L (ref 22–32)
Calcium: 8.7 mg/dL — ABNORMAL LOW (ref 8.9–10.3)
Chloride: 104 mmol/L (ref 98–111)
Creatinine, Ser: 0.7 mg/dL (ref 0.44–1.00)
GFR, Estimated: >60 mL/min (ref >60)
Glucose, Bld: 107 mg/dL — ABNORMAL HIGH (ref 70–99)
Potassium: 3.4 mmol/L — ABNORMAL LOW (ref 3.5–5.1)
Sodium: 140 mmol/L (ref 135–145)

## 2020-11-01 LAB — CBC
HCT: 30.1 % — ABNORMAL LOW (ref 36.0–46.0)
Hemoglobin: 8.9 g/dL — ABNORMAL LOW (ref 12.0–15.0)
MCH: 23.7 pg — ABNORMAL LOW (ref 26.0–34.0)
MCHC: 29.6 g/dL — ABNORMAL LOW (ref 30.0–36.0)
MCV: 80.1 fL (ref 80.0–100.0)
Platelets: 577 10*3/uL — ABNORMAL HIGH (ref 150–400)
RBC: 3.76 MIL/uL — ABNORMAL LOW (ref 3.87–5.11)
RDW: 20.6 % — ABNORMAL HIGH (ref 11.5–15.5)
WBC: 12.4 10*3/uL — ABNORMAL HIGH (ref 4.0–10.5)
nRBC: 0 % (ref 0.0–0.2)

## 2020-11-01 LAB — HCG, QUANTITATIVE, PREGNANCY: hCG, Beta Chain, Quant, S: 1 m[IU]/mL (ref <5)

## 2020-11-01 MED ORDER — OXYCODONE HCL 5 MG PO TABS
10.0000 mg | ORAL_TABLET | ORAL | Status: DC | PRN
  Administered 2020-11-02 (×3): 15 mg via ORAL
  Administered 2020-11-02 – 2020-11-03 (×3): 10 mg via ORAL
  Administered 2020-11-04 – 2020-11-05 (×4): 15 mg via ORAL
  Administered 2020-11-05: 10 mg via ORAL
  Administered 2020-11-06 (×4): 15 mg via ORAL
  Administered 2020-11-07 (×2): 10 mg via ORAL
  Administered 2020-11-07: 15 mg via ORAL
  Filled 2020-11-01 (×6): qty 3
  Filled 2020-11-01 (×2): qty 2
  Filled 2020-11-01 (×3): qty 3
  Filled 2020-11-01: qty 2
  Filled 2020-11-01 (×2): qty 3
  Filled 2020-11-01: qty 2
  Filled 2020-11-01: qty 3

## 2020-11-01 MED ORDER — SODIUM CHLORIDE 0.9 % IV BOLUS
1000.0000 mL | Freq: Once | INTRAVENOUS | Status: AC
  Administered 2020-11-01: 1000 mL via INTRAVENOUS

## 2020-11-01 MED ORDER — DOCUSATE SODIUM 100 MG PO CAPS
100.0000 mg | ORAL_CAPSULE | Freq: Two times a day (BID) | ORAL | Status: DC
  Administered 2020-11-02 – 2020-11-04 (×6): 100 mg via ORAL
  Filled 2020-11-01 (×6): qty 1

## 2020-11-01 MED ORDER — KETAMINE HCL 10 MG/ML IJ SOLN
INTRAMUSCULAR | Status: AC | PRN
  Administered 2020-11-01: 80 mg via INTRAVENOUS

## 2020-11-01 MED ORDER — HYDROMORPHONE HCL 1 MG/ML IJ SOLN
0.5000 mg | INTRAMUSCULAR | Status: DC | PRN
  Administered 2020-11-02: 0.5 mg via INTRAVENOUS
  Administered 2020-11-02 – 2020-11-08 (×11): 1 mg via INTRAVENOUS
  Filled 2020-11-01 (×14): qty 1

## 2020-11-01 MED ORDER — IOHEXOL 350 MG/ML SOLN
100.0000 mL | Freq: Once | INTRAVENOUS | Status: AC | PRN
  Administered 2020-11-01: 100 mL via INTRAVENOUS

## 2020-11-01 MED ORDER — MORPHINE SULFATE (PF) 2 MG/ML IV SOLN
2.0000 mg | INTRAVENOUS | Status: DC | PRN
  Administered 2020-11-01 (×3): 2 mg via INTRAVENOUS
  Filled 2020-11-01 (×3): qty 1

## 2020-11-01 MED ORDER — ONDANSETRON HCL 4 MG/2ML IJ SOLN
4.0000 mg | Freq: Four times a day (QID) | INTRAMUSCULAR | Status: DC | PRN
  Administered 2020-11-02 – 2020-11-28 (×26): 4 mg via INTRAVENOUS
  Filled 2020-11-01 (×27): qty 2

## 2020-11-01 MED ORDER — ENOXAPARIN SODIUM 40 MG/0.4ML IJ SOSY
40.0000 mg | PREFILLED_SYRINGE | Freq: Two times a day (BID) | INTRAMUSCULAR | Status: DC
  Administered 2020-11-02 – 2020-11-03 (×3): 40 mg via SUBCUTANEOUS
  Filled 2020-11-01 (×3): qty 0.4

## 2020-11-01 MED ORDER — OXYCODONE HCL 5 MG PO TABS
5.0000 mg | ORAL_TABLET | ORAL | Status: DC | PRN
  Administered 2020-11-03 – 2020-11-07 (×2): 5 mg via ORAL
  Filled 2020-11-01 (×3): qty 2
  Filled 2020-11-01: qty 1
  Filled 2020-11-01: qty 2

## 2020-11-01 MED ORDER — ACETAMINOPHEN 500 MG PO TABS
1000.0000 mg | ORAL_TABLET | Freq: Four times a day (QID) | ORAL | Status: AC
  Administered 2020-11-02 (×3): 1000 mg via ORAL
  Filled 2020-11-01 (×3): qty 2

## 2020-11-01 MED ORDER — METHOCARBAMOL 1000 MG/10ML IJ SOLN
500.0000 mg | Freq: Four times a day (QID) | INTRAVENOUS | Status: DC | PRN
  Filled 2020-11-01: qty 5

## 2020-11-01 MED ORDER — OXYCODONE HCL 5 MG PO TABS: 5.0000 mg | ORAL_TABLET | ORAL | Status: DC | PRN

## 2020-11-01 MED ORDER — ACETAMINOPHEN 325 MG PO TABS: 325.0000 mg | ORAL_TABLET | Freq: Four times a day (QID) | ORAL | Status: DC | PRN

## 2020-11-01 MED ORDER — ONDANSETRON HCL 4 MG PO TABS
4.0000 mg | ORAL_TABLET | Freq: Four times a day (QID) | ORAL | Status: DC | PRN
  Administered 2020-11-08 – 2020-11-12 (×3): 4 mg via ORAL
  Filled 2020-11-01 (×5): qty 1

## 2020-11-01 MED ORDER — OXYCODONE HCL 5 MG PO TABS
5.0000 mg | ORAL_TABLET | Freq: Once | ORAL | Status: AC
  Administered 2020-11-01: 5 mg via ORAL
  Filled 2020-11-01: qty 1

## 2020-11-01 MED ORDER — POLYETHYLENE GLYCOL 3350 17 G PO PACK
17.0000 g | PACK | Freq: Every day | ORAL | Status: DC | PRN
  Administered 2020-11-05 – 2020-11-11 (×6): 17 g via ORAL
  Filled 2020-11-01 (×6): qty 1

## 2020-11-01 MED ORDER — KETAMINE HCL 50 MG/5ML IJ SOSY
1.0000 mg/kg | PREFILLED_SYRINGE | Freq: Once | INTRAMUSCULAR | Status: AC
  Administered 2020-11-01: 80 mg via INTRAVENOUS
  Filled 2020-11-01: qty 15

## 2020-11-01 MED ORDER — HYDROMORPHONE HCL 1 MG/ML IJ SOLN
1.0000 mg | Freq: Once | INTRAMUSCULAR | Status: AC
  Administered 2020-11-01: 1 mg via INTRAVENOUS
  Filled 2020-11-01: qty 1

## 2020-11-01 MED ORDER — METHOCARBAMOL 500 MG PO TABS
500.0000 mg | ORAL_TABLET | Freq: Four times a day (QID) | ORAL | Status: DC | PRN
  Administered 2020-11-02 – 2020-11-28 (×20): 500 mg via ORAL
  Filled 2020-11-01 (×22): qty 1

## 2020-11-01 MED ORDER — SODIUM CHLORIDE 0.45 % IV SOLN: INTRAVENOUS | Status: DC

## 2020-11-01 NOTE — ED Notes (Signed)
Calls placed to [facility] admitting as well as [facility] admitting per charge [employer]

## 2020-11-01 NOTE — ED Notes (Signed)
Patient transported to CT 

## 2020-11-01 NOTE — Progress Notes (Addendum)
30 year old female 400 pounds got up to go to the bathroom had a  left knee dislocation.  Reduced in the ER under conscious sedation by ER MD.  Len Blalock in this morning to see patient and she was in the MRI scanner and unable to be seen.  A CT angio was done with double dose unfortunately poor visualization.  This was read as nondiagnostic CTA despite 2 separate contrast bolus attempts.  Patient was reported to have intact peroneal nerve sensation lateral aspect of the foot.  Would recommend transfer to [facility] and vascular surgery consultation.  She will require admission, immobilization in a knee immobilizer.  MRI of knee shows ACL PCL tears posterior lateral corner injury, patellar dislocation, MCL sprain muscle muscle tears.  She is at risk for popliteal intimal artery tear and loss of arterial supply to the lower extremity.  We will have vascular evaluate her at [facility].  She will require admission either on the trauma service or my service and expect mobility issues with weight 400 pounds and multi ligament knee injury.  I talked with Wyona Almas PA will talk with the trauma service and arrange transfer to [facility] to have vascular evaluate her and consider whether they feel an arteriogram is indicated.  Pulse ox to her toes for monitoring currently.

## 2020-11-01 NOTE — Progress Notes (Signed)
PT Cancellation Note  Patient Details Name: JANNIS ATKINS MRN: [id number] DOB: [birth year]   Cancelled Treatment:    Reason Eval/Treat Not Completed: Patient not medically ready, to transfer to [facility] for vascular consult.   Rada Hay 11/01/2020, 10:10 AM Blanchard Kelch PT Acute Rehabilitation Services Pager [phone] Office [phone]

## 2020-11-01 NOTE — Progress Notes (Signed)
ABI and limited left lower extremity arterial duplex completed. Refer to "CV Proc" under chart review to view preliminary results.  11/01/2020 7:16 PM Eula Fried., MHA, RVT, RDCS, RDMS

## 2020-11-01 NOTE — Progress Notes (Signed)
This RT present for duration of sedation procedure.  Pt remained stable and comfortable throughout procedure on EtCO2 monitoring.

## 2020-11-01 NOTE — H&P (Signed)
Carla Little is an 30 y.o. female.   Chief Complaint: Fall with left knee dislocation. HPI: 30 year old female morbidly obese got up to go the bathroom felt a pop in her knee sudden instability fell and was unable to get back up.  Patient was brought by EMS to ER were knee dislocation was reduced by the ER MD and x-rays demonstrated fractures of the proximal fibula ligamentous avulsion laterally as well as medially off the medial femoral condyle.  MRI scan showed complete ACL tear PCL tear medial collateral ligament strain.  Fibular collateral posterior lateral corner ligamentous disruption.  Large effusion.  Patellar subluxation.  Patient is placed in a month knee immobilizer.  Knee immobilizer barely fits due to body habitus with weight 400 pounds.  Past Medical History:  Diagnosis Date   Depression    GERD (gastroesophageal reflux disease)    Obesity     History reviewed. No pertinent surgical history.  History reviewed. No pertinent family history. Social History:  reports that she has never smoked. She has never used smokeless tobacco. She reports current alcohol use. She reports that she does not use drugs.  Allergies:  Allergies  Allergen Reactions   Azithromycin Shortness Of Breath and Nausea And Vomiting    (Not in a hospital admission)   Results for orders placed or performed during the hospital encounter of 10/31/20 (from the past 48 hour(s))  CBC     Status: Abnormal   Collection Time: 11/01/20  1:50 AM  Result Value Ref Range   WBC 12.4 (H) 4.0 - 10.5 K/uL   RBC 3.76 (L) 3.87 - 5.11 MIL/uL   Hemoglobin 8.9 (L) 12.0 - 15.0 g/dL   HCT 40.9 (L) 81.1 - 91.4 %   MCV 80.1 80.0 - 100.0 fL   MCH 23.7 (L) 26.0 - 34.0 pg   MCHC 29.6 (L) 30.0 - 36.0 g/dL   RDW 78.2 (H) 95.6 - 21.3 %   Platelets 577 (H) 150 - 400 K/uL   nRBC 0.0 0.0 - 0.2 %    Comment: Performed at [facility], [address], [place], [place] [address]  Basic metabolic panel      Status: Abnormal   Collection Time: 11/01/20  1:50 AM  Result Value Ref Range   Sodium 140 135 - 145 mmol/L   Potassium 3.4 (L) 3.5 - 5.1 mmol/L   Chloride 104 98 - 111 mmol/L   CO2 27 22 - 32 mmol/L   Glucose, Bld 107 (H) 70 - 99 mg/dL    Comment: Glucose reference range applies only to samples taken after fasting for at least 8 hours.   BUN 10 6 - 20 mg/dL   Creatinine, Ser 8.46 0.44 - 1.00 mg/dL   Calcium 8.7 (L) 8.9 - 10.3 mg/dL   GFR, Estimated >96 >29 mL/min    Comment: (NOTE) Calculated using the CKD-EPI Creatinine Equation (2021)    Anion gap 9 5 - 15    Comment: Performed at [facility], [address] [place], [place] [address]  hCG, quantitative, pregnancy     Status: None   Collection Time: 11/01/20  1:50 AM  Result Value Ref Range   hCG, Beta Chain, Quant, S 1 <5 mIU/mL    Comment:          GEST. AGE      CONC.  (mIU/mL)   <=1 WEEK        5 - 50     2 WEEKS  50 - 500     3 WEEKS       100 - 10,000     4 WEEKS     1,000 - 30,000     5 WEEKS     3,500 - 115,000   6-8 WEEKS     12,000 - 270,000    12 WEEKS     15,000 - 220,000        FEMALE AND NON-PREGNANT FEMALE:     LESS THAN 5 mIU/mL Performed at [facility], [address] [place], [place] [address]   Resp Panel by RT-PCR (Flu A&B, Covid) Nasopharyngeal Swab     Status: None   Collection Time: 11/01/20  9:58 AM   Specimen: Nasopharyngeal Swab; Nasopharyngeal(NP) swabs in vial transport medium  Result Value Ref Range   SARS Coronavirus 2 by RT PCR NEGATIVE NEGATIVE    Comment: (NOTE) SARS-CoV-2 target nucleic acids are NOT DETECTED.  The SARS-CoV-2 RNA is generally detectable in upper respiratory specimens during the acute phase of infection. The lowest concentration of SARS-CoV-2 viral copies this assay can detect is 138 copies/mL. A negative result does not preclude SARS-Cov-2 infection and should not be used as the sole basis for treatment or other patient  management decisions. A negative result may occur with  improper specimen collection/handling, submission of specimen other than nasopharyngeal swab, presence of viral mutation(s) within the areas targeted by this assay, and inadequate number of viral copies(<138 copies/mL). A negative result must be combined with clinical observations, patient history, and epidemiological information. The expected result is Negative.  Fact Sheet for Patients:  BloggerCourse.com  Fact Sheet for Healthcare Providers:  SeriousBroker.it  This test is no t yet approved or cleared by the [place] FDA and  has been authorized for detection and/or diagnosis of SARS-CoV-2 by FDA under an Emergency Use Authorization (EUA). This EUA will remain  in effect (meaning this test can be used) for the duration of the COVID-19 declaration under Section 564(b)(1) of the Act, 21 U.S.C.section 360bbb-3(b)(1), unless the authorization is terminated  or revoked sooner.       Influenza A by PCR NEGATIVE NEGATIVE   Influenza B by PCR NEGATIVE NEGATIVE    Comment: (NOTE) The Xpert Xpress SARS-CoV-2/FLU/RSV plus assay is intended as an aid in the diagnosis of influenza from Nasopharyngeal swab specimens and should not be used as a sole basis for treatment. Nasal washings and aspirates are unacceptable for Xpert Xpress SARS-CoV-2/FLU/RSV testing.  Fact Sheet for Patients: BloggerCourse.com  Fact Sheet for Healthcare Providers: SeriousBroker.it  This test is not yet approved or cleared by the [place] FDA and has been authorized for detection and/or diagnosis of SARS-CoV-2 by FDA under an Emergency Use Authorization (EUA). This EUA will remain in effect (meaning this test can be used) for the duration of the COVID-19 declaration under Section 564(b)(1) of the Act, 21 U.S.C. section 360bbb-3(b)(1), unless the  authorization is terminated or revoked.  Performed at [facility], [address] [place], [place] [address]    DG Tibia/Fibula Left  Result Date: 11/01/2020 CLINICAL DATA:  Trip and fall and heard a crack. Pain mostly about the knee and proximal lower leg. EXAM: LEFT TIBIA AND FIBULA - 2 VIEW COMPARISON:  None. FINDINGS: There is posterior dislocation of the femur with respect to the tibia. Apex lateral angulation. Patella appears aligned with the distal femur. Suspected fracture about the proximal fibular neck. Ankle alignment and ankle mortise are preserved. Mild  generalized soft tissue edema. IMPRESSION: Posterior dislocation of the femur with respect to the tibia. Suspected fracture about the proximal fibular neck. Electronically Signed   By: Narda Rutherford M.D.   On: 11/01/2020 00:03   CT ANGIO LOW EXTREM LEFT W &/OR WO CONTRAST  Result Date: 11/01/2020 CLINICAL DATA:  30 year old female status post left knee fracture dislocation last night after fall from bed. Status post reduction. EXAM: CT ANGIOGRAPHY LOWER LEFT EXTREMITY TECHNIQUE: Multi detector CT angiogram of the left lower extremity during bolus contrast administration. CONTRAST:  200 mL OMNIPAQUE IOHEXOL 350 MG/ML SOLN COMPARISON:  Left tib fib series 2339 hours yesterday and left knee series 0230 hours today. FINDINGS: Despite two separate contrast bolus attempts, there is poor arterial contrast bolus in the left lower extremity on this exam. The major arterial structures of the left lower extremity appear grossly patent. Excreted IV contrast is present in the urinary bladder on the 2nd attempt. No pelvic free fluid. Negative visible lower abdominal and pelvic viscera. Visible bony pelvis appears intact. Left femoral head normally located. Comminuted but largely nondisplaced fracture of the medial distal right femur at the condyle is in the area of the medial collateral ligament and appears to spare the articular  surface as seen on series 7, image 117 and series 5, image 184. No knee joint effusion. Patella appears to be intact although is laterally subluxed on series 5, image 181. There is mild comminution with tiny bone fragments at the lateral tibial plateau relatively sparing the articular surface as seen on series 7, image 113. Otherwise the tibia is intact. Comminuted relatively nondisplaced fracture of the fibula head. Otherwise the fibula is intact. Ankle joint alignment is preserved. There is soft tissue swelling and stranding along the superficial muscle air in subcutaneous tissues at the lateral joint space and tracking into the posterior proximal calf. No soft tissue gas identified. Review of the MIP images confirms the above findings. IMPRESSION: 1. Nondiagnostic CTA of the left lower extremity despite two separate contrast bolus attempts. 2. Comminuted but largely nondisplaced fractures of both the medial distal right femur and the lateral proximal tibia. Both fracture sites relatively spare the articular surfaces and may be sequelae of collateral ligamentous avulsion. 3. Lateral patellar subluxation but no patellar fracture. 4. Mildly comminuted but relatively nondisplaced fibular head fracture. 5. No knee joint effusion. Posttraumatic soft tissue swelling and stranding with no soft tissue gas. Electronically Signed   By: Odessa Fleming M.D.   On: 11/01/2020 05:59   MR KNEE LEFT WO CONTRAST  Result Date: 11/01/2020 CLINICAL DATA:  Larey Seat.  Knee dislocation. EXAM: MRI OF THE LEFT KNEE WITHOUT CONTRAST TECHNIQUE: Multiplanar, multisequence MR imaging of the knee was performed. No intravenous contrast was administered. COMPARISON:  Radiographs 11/01/2020 FINDINGS: Exam quite limited by body habitus and patient motion artifact. MENISCI Medial meniscus:  Grossly intact. Lateral meniscus:  Grossly intact. LIGAMENTS Cruciates: Complete ACL tear. Suspect PCL torn from its femoral attachment site. Collaterals: Proximal MCL  strain. No complete tear. The fibular collateral ligament is torn. The iliotibial band is intact and the biceps femoris tendon is injured but not completely ruptured. The popliteus tendon is injured but I believe it is still intact. Suspect arcuate ligament tear and posterolateral corner injury. CARTILAGE Patellofemoral:  Grossly normal. Medial:  Intact Lateral:  Intact Joint:  Moderate to large joint effusion. Popliteal Fossa: Extensive fluid/hematoma and edema involving the posterior compartment of the knee with medial and lateral gastroc muscle tears. Extensor Mechanism: The  patella is significantly subluxed laterally. The medial retinaculum and medial patellofemoral ligaments are torn from the femoral attachment site. The TT-TG distance is 3 cm. Bones: Bone contusion involving the medial femoral condyle. There is also an avulsion type fracture involving the fibular head. Other: Medial and lateral gastroc muscle tears. The popliteus muscle is also torn and the anterior tibialis muscle is torn. IMPRESSION: 1. Exam quite limited by body habitus and patient motion artifact. 2. Extensive knee injury including ACL and PCL tears, posterolateral corner injury, patellar dislocation injury, MCL sprain, multiple muscle tears, medial femoral bone contusion an avulsion fracture involving the fibular head. 3. No obvious meniscal tears but this could certainly be missed. 4. Large joint effusion. Electronically Signed   By: Rudie MeyerP.  Gallerani M.D.   On: 11/01/2020 08:28   DG Knee Left Port  Result Date: 11/01/2020 CLINICAL DATA:  Status post reduction EXAM: PORTABLE LEFT KNEE - 1-2 VIEW COMPARISON:  Films from previous day. FINDINGS: Prior knee dislocation has been reduced. The tibia and fibula demonstrate normal articulation. Avulsion is noted from the medial aspect of the distal femur as well as fracture through the fibular head. IMPRESSION: Interval reduction of dislocation. Proximal fibular fracture is again noted. Avulsion  from the medial aspect of the distal femur is noted. Electronically Signed   By: Alcide CleverMark  Lukens M.D.   On: 11/01/2020 02:45   DG Foot 2 Views Left  Result Date: 11/01/2020 CLINICAL DATA:  Fall, pain, injury. EXAM: LEFT FOOT - 2 VIEW COMPARISON:  None. FINDINGS: There is no evidence of fracture or dislocation. There is no evidence of arthropathy or other focal bone abnormality. Generalized soft tissue edema versus habitus. IMPRESSION: No fracture or dislocation of the foot. Electronically Signed   By: Narda RutherfordMelanie  Sanford M.D.   On: 11/01/2020 00:04   VAS US ABI WITH/WO TBI  Result Date: 11/01/2020  LOWER EXTREMITY DOPPLER STUDY Patient Name:  Carla GreeningVictoria E Little  Date of Exam:   11/01/2020 Medical Rec #: [id number]          Accession #:    [id number] Date of Birth: [birth year]         Patient Gender: F Patient Age:   [age] years [facility] Procedure:      VAS US ABI WITH/WO TBI Referring Phys: Leonette MostHARLES FIELDS --------------------------------------------------------------------------------  Indications: S/p fall. Left ACL, PCL tears, patellar dislocation, MCL sprain              muscle tears; at risk for popliteal intimal artery tear.  Comparison Study: No prior study Performing Technologist: Gertie FeySimonetti, Michelle MHA, RVT, RDCS, RDMS  Examination Guidelines: A complete evaluation includes at minimum, Doppler waveform signals and systolic blood pressure reading at the level of bilateral brachial, anterior tibial, and posterior tibial arteries, when vessel segments are accessible. Bilateral testing is considered an integral part of a complete examination. Photoelectric Plethysmograph (PPG) waveforms and toe systolic pressure readings are included as required and additional duplex testing as needed. Limited examinations for reoccurring indications may be performed as noted.  ABI Findings: +--------+------------------+-----+---------+--------+ Right   Rt Pressure (mmHg)IndexWaveform Comment   +--------+------------------+-----+---------+--------+ [id number]                    triphasic         +--------+------------------+-----+---------+--------+ PTA     115               0.86 triphasic         +--------+------------------+-----+---------+--------+ DP  154               1.15 triphasic         +--------+------------------+-----+---------+--------+ +--------+------------------+-----+---------+----------------------------------+ Left    Lt Pressure (mmHg)IndexWaveform Comment                            +--------+------------------+-----+---------+----------------------------------+ Brachial                       triphasicUnable to obtain pressure due to                                           IV location                        +--------+------------------+-----+---------+----------------------------------+ PTA     150               1.12 triphasic                                   +--------+------------------+-----+---------+----------------------------------+ DP      132               0.99 triphasic                                   +--------+------------------+-----+---------+----------------------------------+ +-------+-----------+-----------+------------+------------+ ABI/TBIToday's ABIToday's TBIPrevious ABIPrevious TBI +-------+-----------+-----------+------------+------------+ Right  1.15                                           +-------+-----------+-----------+------------+------------+ Left   1.12                                           +-------+-----------+-----------+------------+------------+  Summary: Right: Resting right ankle-brachial index is within normal range. No evidence of significant right lower extremity arterial disease. Left: Resting left ankle-brachial index is within normal range. No evidence of significant left lower extremity arterial disease.  *See table(s) above for measurements and observations.      Preliminary    DG Femur Min 2 Views Left  Result Date: 11/01/2020 CLINICAL DATA:  Trip and fall and heard a crack. Pain mostly about the knee and proximal lower leg. EXAM: LEFT FEMUR 2 VIEWS COMPARISON:  None. FINDINGS: Knee dislocation is better assessed on concurrent tibia/fibula exam. No evidence of femur fracture. Hip alignment is grossly maintained. Soft tissue edema from habitus limits detailed assessment. IMPRESSION: No fracture of the left femur. Knee dislocation better assessed on concurrent tibia/fibula exam. Electronically Signed   By: Narda Rutherford M.D.   On: 11/01/2020 00:02   VAS [place] LOWER EXTREMITY ARTERIAL DUPLEX  Result Date: 11/01/2020 LOWER EXTREMITY ARTERIAL DUPLEX STUDY Patient Name:  Carla Little  Date of Exam:   11/01/2020 Medical Rec #: [id number]          Accession #:    [id number] Date of Birth: 05/15/1990         Patient Gender: F Patient Age:   53 years Exam Location:  [facility]  Procedure:      VAS [place] LOWER EXTREMITY ARTERIAL DUPLEX Referring Phys: CHARLES FIELDS --------------------------------------------------------------------------------  Indications: S/p fall. Left ACL, PCL tears, patellar dislocation, MCL sprain              muscle tears; at risk for popliteal intimal artery tear.  Current ABI: Right=1.15, Left=1.12 Limitations: Body habitus, restricted mobility, depth of vessels. Evaluation of              left popliteal artery limited to color and pulsed wave Doppler              only; unable to evaluate using B-mode ultrasound, or linear array              transducer for better definition of intimal wall secondary to              technical limitations. Comparison Study: No prior study Performing Technologist: Gertie Fey MHA, RDMS, RVT, RDCS  Examination Guidelines: A complete evaluation includes B-mode imaging, spectral Doppler, color Doppler, and power Doppler as needed of all accessible portions of each vessel. Bilateral testing is considered an  integral part of a complete examination. Limited examinations for reoccurring indications may be performed as noted.   +--------------+-------+-----------+---------+--------+-----+--------+ Left PoplitealAP (cm)Transv (cm)Waveform StenosisShapeComments +--------------+-------+-----------+---------+--------+-----+--------+ Mid                             triphasicnormal                +--------------+-------+-----------+---------+--------+-----+--------+  Summary: Left: No evidence of hemodynamically significant stenosis. No obvious evidence of dissection by color Doppler.  See table(s) above for measurements and observations.    Preliminary     Review of Systems  Constitutional:  Positive for activity change and appetite change.  Respiratory:  Negative for shortness of breath.   Cardiovascular:  Negative for chest pain.  Past psychiatric problems with suicidal ideation.  Denies this currently. Blood pressure 116/78, pulse (!) 125, temperature 98.1 F (36.7 C), temperature source Oral, resp. rate 18, height 5\' 4"  (1.626 m), weight (!) 181.4 kg, last menstrual period 10/03/2020, SpO2 94 %. Physical Exam Constitutional:      Appearance: She is obese.  HENT:     Head: Atraumatic.     Right Ear: External ear normal.     Left Ear: External ear normal.  Eyes:     Extraocular Movements: Extraocular movements intact.  Cardiovascular:     Rate and Rhythm: Normal rate.  Pulmonary:     Effort: Pulmonary effort is normal.  Abdominal:     Comments: Morbidly obese, liver and spleen not palpable.  Musculoskeletal:        General: Swelling present.     Cervical back: Normal range of motion.  Skin:    General: Skin is warm.     Capillary Refill: Capillary refill takes less than 2 seconds.  Neurological:     General: No focal deficit present.     Mental Status: She is alert.  Psychiatric:        Mood and Affect: Mood normal.     Assessment/Plan Doppler visualization of left lower  extremity by vascular service appreciated.  Pulse ox on her toe which needs to remain on her toe and needs to remain above 95 currently at 100.  Discussed with patient risk for intimal injury to the artery with knee dislocation and potential for sudden arterial blockage with blood flow from the knee down which could  require emergent surgery.  With body habitus normal multi ligament reconstruction is a significant undertaking for her.  Plan immobilization knee immobilizer.  Eldred Manges, MD 11/01/2020, 8:32 PM

## 2020-11-01 NOTE — ED Notes (Signed)
Calls placed to admissions for Carla Little to remove this patient from bed placement as to acquire bed placement for patient to [facility]

## 2020-11-01 NOTE — ED Notes (Signed)
Tech at bedside to perform ultrasound

## 2020-11-01 NOTE — Consult Note (Signed)
ABIs and arterial duplex reviewed.  Patient has normal ABIs bilaterally.  They are symmetric.  Duplex ultrasound of the popliteal artery showed no obvious narrowing or defect.  No vascular intervention planned currently.  Please call if questions or if you need [place] to do further evaluation.  Fabienne Bruns, MD [facility] Office: [phone]

## 2020-11-01 NOTE — ED Notes (Signed)
Report received from I-Li, RN 

## 2020-11-01 NOTE — ED Notes (Signed)
Per provider, changed pulse ox to left toe to monitor for vascular injury. Placed on great toe, SpO2 97%

## 2020-11-01 NOTE — ED Notes (Signed)
ED TO INPATIENT HANDOFF REPORT  Name/Age/Gender Carla Little 30 y.o. female  Code Status    Code Status Orders  (From admission, onward)         Start     Ordered   11/01/20 2040  Full code  Continuous        11/01/20 2045        Code Status History    Date Active Date Inactive Code Status Order ID Comments User Context   11/01/2020 0956 11/01/2020 2045 Full Code [id number]  [facility]   05/16/2018 2026 05/17/2018 1334 Full Code [id number]  [facility]      Home/SNF/Other Home  Chief Complaint Left knee dislocation [S83.105A] Knee dislocation, left, initial encounter [S83.105A]  Level of Care/Admitting Diagnosis ED Disposition    ED Disposition  Admit   Condition  --   Comment  Hospital Area: [facility] [[id number]]  Level of Care: Med-Surg [16]  May admit patient to [facility] or [facility] if equivalent level of care is available:: No  Covid Evaluation: Confirmed COVID Negative  Diagnosis: Knee dislocation, left, initial encounter [4401027]  Admitting Physician: Eldred Manges [[id number]]  Attending Physician: Eldred Manges [[id number]]  Estimated length of stay: past midnight tomorrow  Certification:: I certify this patient will need inpatient services for at least 2 midnights         Medical History Past Medical History:  Diagnosis Date  . Depression   . GERD (gastroesophageal reflux disease)   . Obesity     Allergies Allergies  Allergen Reactions  . Azithromycin Shortness Of Breath and Nausea And Vomiting    IV Location/Drains/Wounds Patient Lines/Drains/Airways Status    Active Line/Drains/Airways    Name Placement date Placement time Site Days   Peripheral IV 10/31/20 20 G Left Forearm 10/31/20  2100  Forearm  1   Peripheral IV 11/01/20 18 G 1.16" Left Antecubital 11/01/20  0147  Antecubital  less than 1          Labs/Imaging Results for orders placed or performed during the hospital  encounter of 10/31/20 (from the past 48 hour(s))  CBC     Status: Abnormal   Collection Time: 11/01/20  1:50 AM  Result Value Ref Range   WBC 12.4 (H) 4.0 - 10.5 K/uL   RBC 3.76 (L) 3.87 - 5.11 MIL/uL   Hemoglobin 8.9 (L) 12.0 - 15.0 g/dL   HCT 25.3 (L) 66.4 - 40.3 %   MCV 80.1 80.0 - 100.0 fL   MCH 23.7 (L) 26.0 - 34.0 pg   MCHC 29.6 (L) 30.0 - 36.0 g/dL   RDW 47.4 (H) 25.9 - 56.3 %   Platelets 577 (H) 150 - 400 K/uL   nRBC 0.0 0.0 - 0.2 %    Comment: Performed at [facility], [address], [place], [place] [address]  Basic metabolic panel     Status: Abnormal   Collection Time: 11/01/20  1:50 AM  Result Value Ref Range   Sodium 140 135 - 145 mmol/L   Potassium 3.4 (L) 3.5 - 5.1 mmol/L   Chloride 104 98 - 111 mmol/L   CO2 27 22 - 32 mmol/L   Glucose, Bld 107 (H) 70 - 99 mg/dL    Comment: Glucose reference range applies only to samples taken after fasting for at least 8 hours.   BUN 10 6 - 20 mg/dL   Creatinine, Ser 3.32 0.44 - 1.00 mg/dL  Calcium 8.7 (L) 8.9 - 10.3 mg/dL   GFR, Estimated >29 >93 mL/min    Comment: (NOTE) Calculated using the CKD-EPI Creatinine Equation (2021)    Anion gap 9 5 - 15    Comment: Performed at [facility], [address] [place], [place] [address]  hCG, quantitative, pregnancy     Status: None   Collection Time: 11/01/20  1:50 AM  Result Value Ref Range   hCG, Beta Chain, Quant, S 1 <5 mIU/mL    Comment:          GEST. AGE      CONC.  (mIU/mL)   <=1 WEEK        5 - 50     2 WEEKS       50 - 500     3 WEEKS       100 - 10,000     4 WEEKS     1,000 - 30,000     5 WEEKS     3,500 - 115,000   6-8 WEEKS     12,000 - 270,000    12 WEEKS     15,000 - 220,000        FEMALE AND NON-PREGNANT FEMALE:     LESS THAN 5 mIU/mL Performed at [facility], [address], [place], [place] [address]   Resp Panel by RT-PCR (Flu A&B, Covid) Nasopharyngeal Swab     Status: None   Collection Time:  11/01/20  9:58 AM   Specimen: Nasopharyngeal Swab; Nasopharyngeal(NP) swabs in vial transport medium  Result Value Ref Range   SARS Coronavirus 2 by RT PCR NEGATIVE NEGATIVE    Comment: (NOTE) SARS-CoV-2 target nucleic acids are NOT DETECTED.  The SARS-CoV-2 RNA is generally detectable in upper respiratory specimens during the acute phase of infection. The lowest concentration of SARS-CoV-2 viral copies this assay can detect is 138 copies/mL. A negative result does not preclude SARS-Cov-2 infection and should not be used as the sole basis for treatment or other patient management decisions. A negative result may occur with  improper specimen collection/handling, submission of specimen other than nasopharyngeal swab, presence of viral mutation(s) within the areas targeted by this assay, and inadequate number of viral copies(<138 copies/mL). A negative result must be combined with clinical observations, patient history, and epidemiological information. The expected result is Negative.  Fact Sheet for Patients:  BloggerCourse.com  Fact Sheet for Healthcare Providers:  SeriousBroker.it  This test is no t yet approved or cleared by the [place] FDA and  has been authorized for detection and/or diagnosis of SARS-CoV-2 by FDA under an Emergency Use Authorization (EUA). This EUA will remain  in effect (meaning this test can be used) for the duration of the COVID-19 declaration under Section 564(b)(1) of the Act, 21 U.S.C.section 360bbb-3(b)(1), unless the authorization is terminated  or revoked sooner.       Influenza A by PCR NEGATIVE NEGATIVE   Influenza B by PCR NEGATIVE NEGATIVE    Comment: (NOTE) The Xpert Xpress SARS-CoV-2/FLU/RSV plus assay is intended as an aid in the diagnosis of influenza from Nasopharyngeal swab specimens and should not be used as a sole basis for treatment. Nasal washings and aspirates are  unacceptable for Xpert Xpress SARS-CoV-2/FLU/RSV testing.  Fact Sheet for Patients: BloggerCourse.com  Fact Sheet for Healthcare Providers: SeriousBroker.it  This test is not yet approved or cleared by the [place] FDA and has been authorized for detection and/or diagnosis of SARS-CoV-2 by FDA  under an Emergency Use Authorization (EUA). This EUA will remain in effect (meaning this test can be used) for the duration of the COVID-19 declaration under Section 564(b)(1) of the Act, 21 U.S.C. section 360bbb-3(b)(1), unless the authorization is terminated or revoked.  Performed at [facility], [address] [place], [place] [address]    DG Tibia/Fibula Left  Result Date: 11/01/2020 CLINICAL DATA:  Trip and fall and heard a crack. Pain mostly about the knee and proximal lower leg. EXAM: LEFT TIBIA AND FIBULA - 2 VIEW COMPARISON:  None. FINDINGS: There is posterior dislocation of the femur with respect to the tibia. Apex lateral angulation. Patella appears aligned with the distal femur. Suspected fracture about the proximal fibular neck. Ankle alignment and ankle mortise are preserved. Mild generalized soft tissue edema. IMPRESSION: Posterior dislocation of the femur with respect to the tibia. Suspected fracture about the proximal fibular neck. Electronically Signed   By: Narda Rutherford M.D.   On: 11/01/2020 00:03   CT ANGIO LOW EXTREM LEFT W &/OR WO CONTRAST  Result Date: 11/01/2020 CLINICAL DATA:  30 year old female status post left knee fracture dislocation last night after fall from bed. Status post reduction. EXAM: CT ANGIOGRAPHY LOWER LEFT EXTREMITY TECHNIQUE: Multi detector CT angiogram of the left lower extremity during bolus contrast administration. CONTRAST:  200 mL OMNIPAQUE IOHEXOL 350 MG/ML SOLN COMPARISON:  Left tib fib series 2339 hours yesterday and left knee series 0230 hours today. FINDINGS: Despite two  separate contrast bolus attempts, there is poor arterial contrast bolus in the left lower extremity on this exam. The major arterial structures of the left lower extremity appear grossly patent. Excreted IV contrast is present in the urinary bladder on the 2nd attempt. No pelvic free fluid. Negative visible lower abdominal and pelvic viscera. Visible bony pelvis appears intact. Left femoral head normally located. Comminuted but largely nondisplaced fracture of the medial distal right femur at the condyle is in the area of the medial collateral ligament and appears to spare the articular surface as seen on series 7, image 117 and series 5, image 184. No knee joint effusion. Patella appears to be intact although is laterally subluxed on series 5, image 181. There is mild comminution with tiny bone fragments at the lateral tibial plateau relatively sparing the articular surface as seen on series 7, image 113. Otherwise the tibia is intact. Comminuted relatively nondisplaced fracture of the fibula head. Otherwise the fibula is intact. Ankle joint alignment is preserved. There is soft tissue swelling and stranding along the superficial muscle air in subcutaneous tissues at the lateral joint space and tracking into the posterior proximal calf. No soft tissue gas identified. Review of the MIP images confirms the above findings. IMPRESSION: 1. Nondiagnostic CTA of the left lower extremity despite two separate contrast bolus attempts. 2. Comminuted but largely nondisplaced fractures of both the medial distal right femur and the lateral proximal tibia. Both fracture sites relatively spare the articular surfaces and may be sequelae of collateral ligamentous avulsion. 3. Lateral patellar subluxation but no patellar fracture. 4. Mildly comminuted but relatively nondisplaced fibular head fracture. 5. No knee joint effusion. Posttraumatic soft tissue swelling and stranding with no soft tissue gas. Electronically Signed   By: Odessa Fleming M.D.   On: 11/01/2020 05:59   MR KNEE LEFT WO CONTRAST  Result Date: 11/01/2020 CLINICAL DATA:  Larey Seat.  Knee dislocation. EXAM: MRI OF THE LEFT KNEE WITHOUT CONTRAST TECHNIQUE: Multiplanar, multisequence MR imaging of the knee was performed.  No intravenous contrast was administered. COMPARISON:  Radiographs 11/01/2020 FINDINGS: Exam quite limited by body habitus and patient motion artifact. MENISCI Medial meniscus:  Grossly intact. Lateral meniscus:  Grossly intact. LIGAMENTS Cruciates: Complete ACL tear. Suspect PCL torn from its femoral attachment site. Collaterals: Proximal MCL strain. No complete tear. The fibular collateral ligament is torn. The iliotibial band is intact and the biceps femoris tendon is injured but not completely ruptured. The popliteus tendon is injured but I believe it is still intact. Suspect arcuate ligament tear and posterolateral corner injury. CARTILAGE Patellofemoral:  Grossly normal. Medial:  Intact Lateral:  Intact Joint:  Moderate to large joint effusion. Popliteal Fossa: Extensive fluid/hematoma and edema involving the posterior compartment of the knee with medial and lateral gastroc muscle tears. Extensor Mechanism: The patella is significantly subluxed laterally. The medial retinaculum and medial patellofemoral ligaments are torn from the femoral attachment site. The TT-TG distance is 3 cm. Bones: Bone contusion involving the medial femoral condyle. There is also an avulsion type fracture involving the fibular head. Other: Medial and lateral gastroc muscle tears. The popliteus muscle is also torn and the anterior tibialis muscle is torn. IMPRESSION: 1. Exam quite limited by body habitus and patient motion artifact. 2. Extensive knee injury including ACL and PCL tears, posterolateral corner injury, patellar dislocation injury, MCL sprain, multiple muscle tears, medial femoral bone contusion an avulsion fracture involving the fibular head. 3. No obvious meniscal tears but  this could certainly be missed. 4. Large joint effusion. Electronically Signed   By: Rudie MeyerP.  Gallerani M.D.   On: 11/01/2020 08:28   DG Knee Left Port  Result Date: 11/01/2020 CLINICAL DATA:  Status post reduction EXAM: PORTABLE LEFT KNEE - 1-2 VIEW COMPARISON:  Films from previous day. FINDINGS: Prior knee dislocation has been reduced. The tibia and fibula demonstrate normal articulation. Avulsion is noted from the medial aspect of the distal femur as well as fracture through the fibular head. IMPRESSION: Interval reduction of dislocation. Proximal fibular fracture is again noted. Avulsion from the medial aspect of the distal femur is noted. Electronically Signed   By: Alcide CleverMark  Lukens M.D.   On: 11/01/2020 02:45   DG Foot 2 Views Left  Result Date: 11/01/2020 CLINICAL DATA:  Fall, pain, injury. EXAM: LEFT FOOT - 2 VIEW COMPARISON:  None. FINDINGS: There is no evidence of fracture or dislocation. There is no evidence of arthropathy or other focal bone abnormality. Generalized soft tissue edema versus habitus. IMPRESSION: No fracture or dislocation of the foot. Electronically Signed   By: Narda RutherfordMelanie  Sanford M.D.   On: 11/01/2020 00:04   VAS US ABI WITH/WO TBI  Result Date: 11/01/2020  LOWER EXTREMITY DOPPLER STUDY Patient Name:  Carla GreeningVictoria E Kopischke  Date of Exam:   11/01/2020 Medical Rec #: [id number]          Accession #:    [id number] Date of Birth: Oct 21, 1990         Patient Gender: F Patient Age:   [age] years [facility] Procedure:      VAS US ABI WITH/WO TBI Referring Phys: Leonette MostHARLES FIELDS --------------------------------------------------------------------------------  Indications: S/p fall. Left ACL, PCL tears, patellar dislocation, MCL sprain              muscle tears; at risk for popliteal intimal artery tear.  Comparison Study: No prior study Performing Technologist: Gertie FeySimonetti, Michelle MHA, RVT, RDCS, RDMS  Examination Guidelines: A complete evaluation includes at minimum, Doppler waveform  signals and systolic blood pressure reading  at the level of bilateral brachial, anterior tibial, and posterior tibial arteries, when vessel segments are accessible. Bilateral testing is considered an integral part of a complete examination. Photoelectric Plethysmograph (PPG) waveforms and toe systolic pressure readings are included as required and additional duplex testing as needed. Limited examinations for reoccurring indications may be performed as noted.  ABI Findings: +--------+------------------+-----+---------+--------+ Right   Rt Pressure (mmHg)IndexWaveform Comment  +--------+------------------+-----+---------+--------+ [id number]                    triphasic         +--------+------------------+-----+---------+--------+ PTA     115               0.86 triphasic         +--------+------------------+-----+---------+--------+ DP      154               1.15 triphasic         +--------+------------------+-----+---------+--------+ +--------+------------------+-----+---------+----------------------------------+ Left    Lt Pressure (mmHg)IndexWaveform Comment                            +--------+------------------+-----+---------+----------------------------------+ Brachial                       triphasicUnable to obtain pressure due to                                           IV location                        +--------+------------------+-----+---------+----------------------------------+ PTA     150               1.12 triphasic                                   +--------+------------------+-----+---------+----------------------------------+ DP      132               0.99 triphasic                                   +--------+------------------+-----+---------+----------------------------------+ +-------+-----------+-----------+------------+------------+ ABI/TBIToday's ABIToday's TBIPrevious ABIPrevious TBI  +-------+-----------+-----------+------------+------------+ Right  1.15                                           +-------+-----------+-----------+------------+------------+ Left   1.12                                           +-------+-----------+-----------+------------+------------+  Summary: Right: Resting right ankle-brachial index is within normal range. No evidence of significant right lower extremity arterial disease. Left: Resting left ankle-brachial index is within normal range. No evidence of significant left lower extremity arterial disease.  *See table(s) above for measurements and observations.     Preliminary    DG Femur Min 2 Views Left  Result Date: 11/01/2020 CLINICAL DATA:  Trip and fall and heard a crack. Pain mostly about the knee and proximal lower leg. EXAM: LEFT FEMUR 2  VIEWS COMPARISON:  None. FINDINGS: Knee dislocation is better assessed on concurrent tibia/fibula exam. No evidence of femur fracture. Hip alignment is grossly maintained. Soft tissue edema from habitus limits detailed assessment. IMPRESSION: No fracture of the left femur. Knee dislocation better assessed on concurrent tibia/fibula exam. Electronically Signed   By: Narda Rutherford M.D.   On: 11/01/2020 00:02   VAS [place] LOWER EXTREMITY ARTERIAL DUPLEX  Result Date: 11/01/2020 LOWER EXTREMITY ARTERIAL DUPLEX STUDY Patient Name:  LORIAN YAUN  Date of Exam:   11/01/2020 Medical Rec #: [id number]          Accession #:    [id number] Date of Birth: 27-Feb-1991         Patient Gender: F Patient Age:   28 years Exam Location:  [facility] Procedure:      VAS [place] LOWER EXTREMITY ARTERIAL DUPLEX Referring Phys: Leonette Most FIELDS --------------------------------------------------------------------------------  Indications: S/p fall. Left ACL, PCL tears, patellar dislocation, MCL sprain              muscle tears; at risk for popliteal intimal artery tear.  Current ABI: Right=1.15, Left=1.12 Limitations: Body  habitus, restricted mobility, depth of vessels. Evaluation of              left popliteal artery limited to color and pulsed wave Doppler              only; unable to evaluate using B-mode ultrasound, or linear array              transducer for better definition of intimal wall secondary to              technical limitations. Comparison Study: No prior study Performing Technologist: Gertie Fey MHA, RDMS, RVT, RDCS  Examination Guidelines: A complete evaluation includes B-mode imaging, spectral Doppler, color Doppler, and power Doppler as needed of all accessible portions of each vessel. Bilateral testing is considered an integral part of a complete examination. Limited examinations for reoccurring indications may be performed as noted.   +--------------+-------+-----------+---------+--------+-----+--------+ Left PoplitealAP (cm)Transv (cm)Waveform StenosisShapeComments +--------------+-------+-----------+---------+--------+-----+--------+ Mid                             triphasicnormal                +--------------+-------+-----------+---------+--------+-----+--------+  Summary: Left: No evidence of hemodynamically significant stenosis. No obvious evidence of dissection by color Doppler.  See table(s) above for measurements and observations.    Preliminary     Pending Labs Unresulted Labs (From admission, onward)    Start     Ordered   11/08/20 0500  Creatinine, serum  (enoxaparin (LOVENOX)    CrCl >/= 30 ml/min)  Weekly,   R     Comments: while on enoxaparin therapy    11/01/20 0955   11/01/20 2043  CBC  Once,   STAT        11/01/20 2045   11/01/20 2043  Basic metabolic panel  Once,   STAT        11/01/20 2045   11/01/20 0951  HIV Antibody (routine testing w rflx)  (HIV Antibody (Routine testing w reflex) panel)  Once,   STAT        11/01/20 0955          Vitals/Pain Today's Vitals   11/01/20 1635 11/01/20 1724 11/01/20 2042 11/01/20 2159  BP: 116/78  105/70    Pulse: (!) 125  (!) 125   Resp:  18  18   Temp:      TempSrc:      SpO2: 94%  95%   Weight:      Height:      PainSc:  5  10-Worst pain ever 3     Isolation Precautions Airborne and Contact precautions  Medications Medications  enoxaparin (LOVENOX) injection 40 mg (has no administration in time range)  methocarbamol (ROBAXIN) tablet 500 mg (has no administration in time range)    Or  methocarbamol (ROBAXIN) 500 mg in dextrose 5 % 50 mL IVPB (has no administration in time range)  ondansetron (ZOFRAN) tablet 4 mg (has no administration in time range)    Or  ondansetron (ZOFRAN) injection 4 mg (has no administration in time range)  morphine 2 MG/ML injection 2 mg (2 mg Intravenous Given 11/01/20 2043)  0.45 % sodium chloride infusion (has no administration in time range)  acetaminophen (TYLENOL) tablet 325-650 mg (has no administration in time range)  oxyCODONE (Oxy IR/ROXICODONE) immediate release tablet 5-10 mg (has no administration in time range)  oxyCODONE (Oxy IR/ROXICODONE) immediate release tablet 10-15 mg (has no administration in time range)  HYDROmorphone (DILAUDID) injection 0.5-1 mg (has no administration in time range)  docusate sodium (COLACE) capsule 100 mg (has no administration in time range)  polyethylene glycol (MIRALAX / GLYCOLAX) packet 17 g (has no administration in time range)  acetaminophen (TYLENOL) tablet 1,000 mg (has no administration in time range)  sodium chloride 0.9 % bolus 1,000 mL (0 mLs Intravenous Stopped 11/01/20 0248)  HYDROmorphone (DILAUDID) injection 1 mg (1 mg Intravenous Given 11/01/20 0035)  ketamine 50 mg in normal saline 5 mL (10 mg/mL) syringe (80 mg Intravenous Given 11/01/20 0227)  ketamine (KETALAR) injection (80 mg Intravenous Given 11/01/20 0227)  HYDROmorphone (DILAUDID) injection 1 mg (1 mg Intravenous Given 11/01/20 0306)  iohexol (OMNIPAQUE) 350 MG/ML injection 100 mL (100 mLs Intravenous Contrast Given 11/01/20 0324)  sodium chloride 0.9  % bolus 1,000 mL (0 mLs Intravenous Stopped 11/01/20 0833)  oxyCODONE (Oxy IR/ROXICODONE) immediate release tablet 5 mg (5 mg Oral Given 11/01/20 0630)    Mobility non-ambulatory

## 2020-11-01 NOTE — ED Notes (Signed)
X-ray at bedside

## 2020-11-01 NOTE — ED Notes (Signed)
Patient transported to MRI 

## 2020-11-01 NOTE — Progress Notes (Signed)
Doppler good wave form and artery looked good. Has good pulse ox at 100% . Will cancel transfer to [facility] since she has been sitting for 12 plus hours with no arterial problem and she can be admitted to [facility].   May need bariatric bed due to 400 lbs.   Bariatric bed may make therapy PT/OT more difficult so we will leave this up to the therapist and nurses to decide if they think a bariatric bed would be better than a standard bed.  Plan is for knee immobilizer at all times kept on as tight as possible which barely fits her due to her leg size.  She will be in a knee immobilizer for couple weeks and then transferred to a Bledsoe brace.

## 2020-11-01 NOTE — ED Notes (Addendum)
Charted on wrong patient

## 2020-11-02 ENCOUNTER — Other Ambulatory Visit: Payer: Self-pay

## 2020-11-02 LAB — BASIC METABOLIC PANEL
Anion gap: 10 (ref 5–15)
BUN: 7 mg/dL (ref 6–20)
CO2: 25 mmol/L (ref 22–32)
Calcium: 8.8 mg/dL — ABNORMAL LOW (ref 8.9–10.3)
Chloride: 104 mmol/L (ref 98–111)
Creatinine, Ser: 0.63 mg/dL (ref 0.44–1.00)
GFR, Estimated: >60 mL/min (ref >60)
Glucose, Bld: 119 mg/dL — ABNORMAL HIGH (ref 70–99)
Potassium: 3.5 mmol/L (ref 3.5–5.1)
Sodium: 139 mmol/L (ref 135–145)

## 2020-11-02 LAB — CBC
HCT: 29.8 % — ABNORMAL LOW (ref 36.0–46.0)
HCT: 27.6 % — ABNORMAL LOW (ref 36.0–46.0)
HCT: 27.9 % — ABNORMAL LOW (ref 36.0–46.0)
Hemoglobin: 8.6 g/dL — ABNORMAL LOW (ref 12.0–15.0)
Hemoglobin: 8.2 g/dL — ABNORMAL LOW (ref 12.0–15.0)
Hemoglobin: 8.1 g/dL — ABNORMAL LOW (ref 12.0–15.0)
MCH: 23.6 pg — ABNORMAL LOW (ref 26.0–34.0)
MCH: 23.8 pg — ABNORMAL LOW (ref 26.0–34.0)
MCH: 23.6 pg — ABNORMAL LOW (ref 26.0–34.0)
MCHC: 28.9 g/dL — ABNORMAL LOW (ref 30.0–36.0)
MCHC: 29.7 g/dL — ABNORMAL LOW (ref 30.0–36.0)
MCHC: 29 g/dL — ABNORMAL LOW (ref 30.0–36.0)
MCV: 81.9 fL (ref 80.0–100.0)
MCV: 80.2 fL (ref 80.0–100.0)
MCV: 81.3 fL (ref 80.0–100.0)
Platelets: 502 10*3/uL — ABNORMAL HIGH (ref 150–400)
Platelets: 496 10*3/uL — ABNORMAL HIGH (ref 150–400)
Platelets: 495 10*3/uL — ABNORMAL HIGH (ref 150–400)
RBC: 3.64 MIL/uL — ABNORMAL LOW (ref 3.87–5.11)
RBC: 3.44 MIL/uL — ABNORMAL LOW (ref 3.87–5.11)
RBC: 3.43 MIL/uL — ABNORMAL LOW (ref 3.87–5.11)
RDW: 21.2 % — ABNORMAL HIGH (ref 11.5–15.5)
RDW: 21 % — ABNORMAL HIGH (ref 11.5–15.5)
RDW: 21 % — ABNORMAL HIGH (ref 11.5–15.5)
WBC: 9.1 10*3/uL (ref 4.0–10.5)
WBC: 10.1 10*3/uL (ref 4.0–10.5)
WBC: 9.2 10*3/uL (ref 4.0–10.5)
nRBC: 0 % (ref 0.0–0.2)
nRBC: 0 % (ref 0.0–0.2)
nRBC: 0 % (ref 0.0–0.2)

## 2020-11-02 LAB — RETICULOCYTES
Immature Retic Fract: 36.4 % — ABNORMAL HIGH (ref 2.3–15.9)
RBC.: 3.49 MIL/uL — ABNORMAL LOW (ref 3.87–5.11)
Retic Count, Absolute: 67.4 10*3/uL (ref 19.0–186.0)
Retic Ct Pct: 1.9 % (ref 0.4–3.1)

## 2020-11-02 LAB — HEPATIC FUNCTION PANEL
ALT: 31 U/L (ref 0–44)
AST: 41 U/L (ref 15–41)
Albumin: 3.1 g/dL — ABNORMAL LOW (ref 3.5–5.0)
Alkaline Phosphatase: 61 U/L (ref 38–126)
Bilirubin, Direct: 0.3 mg/dL — ABNORMAL HIGH (ref 0.0–0.2)
Indirect Bilirubin: 0.9 mg/dL (ref 0.3–0.9)
Total Bilirubin: 1.2 mg/dL (ref 0.3–1.2)
Total Protein: 7.3 g/dL (ref 6.5–8.1)

## 2020-11-02 LAB — TSH: TSH: 4.622 u[IU]/mL — ABNORMAL HIGH (ref 0.350–4.500)

## 2020-11-02 LAB — IRON AND TIBC
Iron: 24 ug/dL — ABNORMAL LOW (ref 28–170)
Saturation Ratios: 6 % — ABNORMAL LOW (ref 10.4–31.8)
TIBC: 388 ug/dL (ref 250–450)
UIBC: 364 ug/dL

## 2020-11-02 LAB — PROTIME-INR
INR: 1.2 (ref 0.8–1.2)
Prothrombin Time: 14.9 seconds (ref 11.4–15.2)

## 2020-11-02 LAB — PREPARE RBC (CROSSMATCH)

## 2020-11-02 LAB — ABO/RH: ABO/RH(D): O POS

## 2020-11-02 LAB — VITAMIN D 25 HYDROXY (VIT D DEFICIENCY, FRACTURES): Vit D, 25-Hydroxy: 23.62 ng/mL — ABNORMAL LOW (ref 30–100)

## 2020-11-02 LAB — T4, FREE: Free T4: 1.06 ng/dL (ref 0.61–1.12)

## 2020-11-02 LAB — HEMOGLOBIN A1C
Hgb A1c MFr Bld: 5 % (ref 4.8–5.6)
Mean Plasma Glucose: 96.8 mg/dL

## 2020-11-02 LAB — MAGNESIUM: Magnesium: 1.9 mg/dL (ref 1.7–2.4)

## 2020-11-02 LAB — FERRITIN: Ferritin: 11 ng/mL (ref 11–307)

## 2020-11-02 MED ORDER — LACTATED RINGERS IV BOLUS: 750.0000 mL | Freq: Once | INTRAVENOUS | Status: DC

## 2020-11-02 MED ORDER — VITAMIN D 25 MCG (1000 UNIT) PO TABS
2000.0000 [IU] | ORAL_TABLET | Freq: Every day | ORAL | Status: DC
  Administered 2020-11-03 – 2020-11-25 (×16): 2000 [IU] via ORAL
  Filled 2020-11-02 (×24): qty 2

## 2020-11-02 MED ORDER — POTASSIUM CHLORIDE CRYS ER 20 MEQ PO TBCR
40.0000 meq | EXTENDED_RELEASE_TABLET | Freq: Once | ORAL | Status: AC
  Administered 2020-11-02: 40 meq via ORAL
  Filled 2020-11-02: qty 2

## 2020-11-02 MED ORDER — SODIUM CHLORIDE 0.9% IV SOLUTION: Freq: Once | INTRAVENOUS | Status: DC

## 2020-11-02 MED ORDER — FERROUS SULFATE 325 (65 FE) MG PO TABS
325.0000 mg | ORAL_TABLET | Freq: Every day | ORAL | Status: DC
  Administered 2020-11-03 – 2020-11-11 (×9): 325 mg via ORAL
  Filled 2020-11-02 (×10): qty 1

## 2020-11-02 MED ORDER — VITAMIN D 25 MCG (1000 UNIT) PO TABS
1000.0000 [IU] | ORAL_TABLET | Freq: Every day | ORAL | Status: DC
  Administered 2020-11-02: 1000 [IU] via ORAL
  Filled 2020-11-02: qty 1

## 2020-11-02 MED ORDER — HYDROXYZINE HCL 25 MG PO TABS
25.0000 mg | ORAL_TABLET | Freq: Once | ORAL | Status: AC
  Administered 2020-11-02: 25 mg via ORAL
  Filled 2020-11-02: qty 1

## 2020-11-02 MED ORDER — LACTATED RINGERS IV BOLUS
1000.0000 mL | Freq: Once | INTRAVENOUS | Status: AC
  Administered 2020-11-02: 1000 mL via INTRAVENOUS

## 2020-11-02 MED ORDER — ACETAMINOPHEN 500 MG PO TABS
1000.0000 mg | ORAL_TABLET | Freq: Three times a day (TID) | ORAL | Status: AC
  Administered 2020-11-03 (×3): 1000 mg via ORAL
  Filled 2020-11-02 (×3): qty 2

## 2020-11-02 NOTE — Plan of Care (Signed)
  Problem: Education: Goal: Knowledge of General Education information will improve Description: Including pain rating scale, medication(s)/side effects and non-pharmacologic comfort measures Outcome: Progressing   Problem: Health Behavior/Discharge Planning: Goal: Ability to manage health-related needs will improve Outcome: Progressing   Problem: Activity: Goal: Risk for activity intolerance will decrease Outcome: Progressing   Problem: Clinical Measurements: Goal: Will remain free from infection Outcome: Progressing

## 2020-11-02 NOTE — ED Notes (Signed)
Messaged admitting Provider for further guidance for pt's heart rate.

## 2020-11-02 NOTE — Progress Notes (Signed)
Patient with elevated heart rate up to 130s and C/O left knee pain, PRN pain med given and Dr.Skakle notified, saw patient and order placed. Will continue to assess patient     11/02/20 0842  Assess: MEWS Score  Temp 98.6 F (37 C)  BP 112/60  Pulse Rate (!) 133  Level of Consciousness Alert  SpO2 (!) 87 %  O2 Device Room Air  Assess: MEWS Score  MEWS Temp 0  MEWS Systolic 0  MEWS Pulse 3  MEWS RR 0  MEWS LOC 0  MEWS Score 3  MEWS Score Color Yellow  Assess: if the MEWS score is Yellow or Red  Were vital signs taken at a resting state? Yes  Focused Assessment No change from prior assessment  Does the patient meet 2 or more of the SIRS criteria? Yes  Does the patient have a confirmed or suspected source of infection? No  Provider and Rapid Response Notified? Yes  MEWS guidelines implemented *See Row Information* Yes  Treat  MEWS Interventions Administered prn meds/treatments  Multiple Pain Sites No  Escalate  MEWS: Escalate Yellow: discuss with charge nurse/RN and consider discussing with provider and RRT  Notify: Charge Nurse/RN  Name of Charge Nurse/RN Notified Linna Hoff, RN  Date Charge Nurse/RN Notified 11/02/20  Time Charge Nurse/RN Notified 0845  Assess: SIRS CRITERIA  SIRS Temperature  0  SIRS Pulse 1  SIRS Respirations  0  SIRS WBC 0  SIRS Score Sum  1

## 2020-11-02 NOTE — Progress Notes (Signed)
Physical Therapy Treatment Patient Details Name: Carla Little MRN: [id number] DOB: 10-31-1990 Today's Date: 11/02/2020    History of Present Illness Carla Little is an 30 y.o. female 30 year old female who is morbidly obese, history of depression and got up to go the bathroom and felt a pop in her knee and had sudden instability and was unable to get up, EMS transported to the ER and found to have a knee dislocation which was reduced with fractures of the proximal fibula ligamentous avulsion laterally as well as medially off the medial femoral condyle and MRI showed complete ACL tear, PCL tear and MCL strain and has a large effusion and is going to be in knee immobilizer.  Vascular ultrasounds done and nondiagnostic    PT Comments    Patient assisted in partial rolling, repositioning in bed. KI adjusted and placed 2 ABD pads at posterior lower leg where metal stays  are pressing into leg. Patient's HR in 120-130's SPo2 88% on RA.  Follow Up Recommendations  SNF     Equipment Recommendations  Wheelchair cushion (measurements PT);Wheelchair (measurements PT);Rolling walker with 5" wheels    Recommendations for Other Services       Precautions / Restrictions Precautions Precautions: Fall;Knee Precaution Comments: Dizzy on eval when sat up Required Braces or Orthoses: Knee Immobilizer - Left Knee Immobilizer - Left: On at all times Restrictions Weight Bearing Restrictions: Yes  Other Position/Activity Restrictions: per most recent ortho note patient TWB with immobilizer    Mobility  Bed Mobility Overal bed mobility: Needs Assistance Bed Mobility: Rolling Rolling: Mod assist   Supine to sit: Mod assist;HOB elevated Sit to supine: Max assist;+2 for safety/equipment   General bed mobility comments: Rolled each direction for repositioning, support left leg.    Transfers                 General transfer comment: deferred  Ambulation/Gait                  Stairs             Wheelchair Mobility    Modified Rankin (Stroke Patients Only)       Balance                                    Cognition Arousal/Alertness: Awake/alert Behavior During Therapy: WFL for tasks assessed/performed Overall Cognitive Status: Within Functional Limits for tasks assessed                                        Exercises      General Comments General comments (skin integrity, edema, etc.): HR up to 130, O2 reading mid 80s on room air. RN made aware      Pertinent Vitals/Pain Pain Assessment: Faces Pain Score: 9  Faces Pain Scale: Hurts even more Pain Location: L LE  when rolling Pain Descriptors / Indicators: Grimacing;Discomfort Pain Intervention(s): Monitored during session;Premedicated before session;Repositioned    Home Living Family/patient expects to be discharged to:: Private residence Living Arrangements: Alone Available Help at Discharge: Friend(s);Available PRN/intermittently Type of Home: Apartment Home Access: Level entry   Home Layout: One level Home Equipment: None      Prior Function Level of Independence: Independent      Comments: works in Clinical biochemist   PT Goals (current goals  can now be found in the care plan section) Acute Rehab PT Goals Patient Stated Goal: I want to do  what I can to get up PT Goal Formulation: With patient/family Time For Goal Achievement: 11/16/20 Potential to Achieve Goals: Fair    Frequency    Min 2X/week      PT Plan Current plan remains appropriate    Co-evaluation PT/OT/SLP Co-Evaluation/Treatment: Yes Reason for Co-Treatment: Complexity of the patient's impairments (multi-system involvement);For patient/therapist safety;To address functional/ADL transfers PT goals addressed during session: Mobility/safety with mobility OT goals addressed during session: ADL's and self-care      AM-PAC PT "6 Clicks" Mobility   Outcome  Measure  Help needed turning from your back to your side while in a flat bed without using bedrails?: A Lot Help needed moving from lying on your back to sitting on the side of a flat bed without using bedrails?: A Lot Help needed moving to and from a bed to a chair (including a wheelchair)?: Total Help needed standing up from a chair using your arms (e.g., wheelchair or bedside chair)?: Total Help needed to walk in hospital room?: Total Help needed climbing 3-5 steps with a railing? : Total 6 Click Score: 8    End of Session   Activity Tolerance: Patient tolerated treatment well Patient left: in bed;with call bell/phone within reach;with family/visitor present Nurse Communication: Mobility status;Need for lift equipment PT Visit Diagnosis: Unsteadiness on feet (R26.81);History of falling (Z91.81);Pain Pain - Right/Left: Left Pain - part of body: Knee     Time: 0865-7846 PT Time Calculation (min) (ACUTE ONLY): 17 min  Charges:  $Self Care/Home Management: 8-22                     Blanchard Kelch PT Acute Rehabilitation Services Pager [phone] Office [phone]    Rada Hay 11/02/2020, 4:45 PM

## 2020-11-02 NOTE — Plan of Care (Signed)
Tachycardia She states no smoking or thoughts of withdrawal, denies marijuana Unfortunately had done an ekg still, but was able to get one around 8pm, patient has been tachycardic, asymptomatic, she does note some orthostasis today with certain movemetns and working with PT Will get a stat cbc, started on iron and inreased vitamin d to 2000 units Patient admits that the day she started work she didn't eat much or drink much and has dry mucus membranes and saids that urine was brown in color so will bolus of 3 hours and watch the tachycardia, consider giving more.  No signs of HF as she is laying down flat comfortably. --and LR maintenance. --she is concerned and anxious about not having insurance and I encouraged her to take some deep breaths.  She used to be on vistaril, buspar, lexapro but stopped these because they made her eat.  Consider a dose of vistaril if still anxious but makes her sleepy, she snores  No signs of infection.

## 2020-11-02 NOTE — Evaluation (Addendum)
Physical Therapy Evaluation Patient Details Name: Carla Little MRN: [id number] DOB: 08-20-1990 Today's Date: 11/02/2020   History of Present Illness  Carla Little is an 30 y.o. female 30 year old female who is morbidly obese, history of depression and got up to go the bathroom and felt a pop in her knee and had sudden instability and was unable to get up, EMS transported to the ER and found to have a knee dislocation which was reduced with fractures of the proximal fibula ligamentous avulsion laterally as well as medially off the medial femoral condyle and MRI showed complete ACL tear, PCL tear and MCL strain and has a large effusion and is going to be in knee immobilizer.  Vascular ultrasounds done and nondiagnostic  Clinical Impression  The patient reports much discomfort ,9/10 in the left leg. Premedicated  with PO and IV medication. The KI is not properly  aligned so care taken to reposition as best as body habitus allowed. The KI does not close from knee up  due to body habitus.  Patient did tolerate  mobilizing to sitting on bed edge with 2 mod assist..  Patient is on air Bariatric bed so air deflated as patient started sitting up. Patient required assistance with LLE, Able to pull to sitting on rail.  Patient reported dizziness, assisted back into supine with +2 max. Repositioned in bed. Patient appreciative of  therapy assisting with mobility.  Per Dr. Ophelia Charter 's note, Possible surgery or external fixator Friday.   Patient has barriers  for DC: lives alone, no family support, imited WB on LLE and obesity.   Pt admitted with above diagnosis.  Pt currently with functional limitations due to the deficits listed below (see PT Problem List). Pt will benefit from skilled PT to increase their independence and safety with mobility to allow discharge to the venue listed below.    PT/OT requesting that patient be allowed a "regular" bed as mobility on the air bariatric bed is limiting and can  be unsafe due to "sliding". Request made to Assistant Nurse manager      Follow Up Recommendations SNF    Equipment Recommendations  Wheelchair cushion (measurements PT);Wheelchair (measurements PT)    Recommendations for Other Services       Precautions / Restrictions Precautions Precautions: Fall;Knee Precaution Comments: Dizzy on eval when sat up Required Braces or Orthoses: Knee Immobilizer - left Knee Immobilizer - Left Restrictions Weight Bearing Restrictions: Yes LLE Weight Bearing: Touchdown weight bearing per Dr. Ophelia Charter note  on  8/10 /22     Mobility  Bed Mobility Overal bed mobility: Needs Assistance Bed Mobility: Supine to Sit     Supine to sit: +2 for safety/equipment;HOB elevated;Max assist     General bed mobility comments: assist moving the Left leg, patient able to move right leg, Assist with trunk to sit upright, mod assist, use of bed rails.. Max of 2 to return to supine, support legs, patient becoming dizzy after sitting for ~ 5 minutes    Transfers                    Ambulation/Gait                Stairs            Wheelchair Mobility    Modified Rankin (Stroke Patients Only)       Balance Overall balance assessment: Needs assistance Sitting-balance support: Bilateral upper extremity supported;Feet unsupported Sitting balance-Leahy Scale: Fair  Pertinent Vitals/Pain Pain Assessment: 0-10 Pain Score: 9  Pain Location: left knee Pain Descriptors / Indicators: Discomfort;Restless;Sharp Pain Intervention(s): Limited activity within patient's tolerance;Monitored during session;Premedicated before session;RN gave pain meds during session    Home Living Family/patient expects to be discharged to:: Private residence Living Arrangements: Alone Available Help at Discharge: Friend(s) Type of Home: Apartment Home Access: Level entry     Home Layout: One level Home  Equipment: None      Prior Function Level of Independence: Independent         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 is able to assist with lifting the leg. LLE Sensation: WNL    Cervical / Trunk Assessment Cervical / Trunk Assessment: Normal;Other exceptions Cervical / Trunk Exceptions: body habitus  Communication   Communication: No difficulties  Cognition Arousal/Alertness: Awake/alert Behavior During Therapy: WFL for tasks assessed/performed Overall Cognitive Status: Within Functional Limits for tasks assessed                                        General Comments      Exercises     Assessment/Plan    PT Assessment Patient needs continued PT services  PT Problem List Decreased strength;Decreased knowledge of precautions;Decreased range of motion;Decreased mobility;Decreased knowledge of use of DME;Decreased activity tolerance;Decreased safety awareness;Obesity       PT Treatment Interventions DME instruction;Therapeutic activities;Therapeutic exercise;Patient/family education;Functional mobility training;Wheelchair mobility training    PT Goals (Current goals can be found in the Care Plan section)  Acute Rehab PT Goals Patient Stated Goal: I want to do  what I can to get up PT Goal Formulation: With patient/family Time For Goal Achievement: 11/16/20 Potential to Achieve Goals: Fair    Frequency Min 3X/week   Barriers to discharge Decreased caregiver support      Co-evaluation PT/OT/SLP Co-Evaluation/Treatment: Yes Reason for Co-Treatment: Complexity of the patient's impairments (multi-system involvement);For patient/therapist safety;To address functional/ADL transfers PT goals addressed during session:  Mobility/safety with mobility OT goals addressed during session: ADL's and self-care       AM-PAC PT "6 Clicks" Mobility  Outcome Measure Help needed turning from your back to your side while in a flat bed without using bedrails?: A Lot Help needed moving from lying on your back to sitting on the side of a flat bed without using bedrails?: Total Help needed moving to and from a bed to a chair (including a wheelchair)?: Total Help needed standing up from a chair using your arms (e.g., wheelchair or bedside chair)?: Total Help needed to walk in hospital room?: Total Help needed climbing 3-5 steps with a railing? : Total 6 Click Score: 7    End of Session   Activity Tolerance: Patient limited by pain;Treatment limited secondary to medical complications (Comment) Patient left: in bed;with call bell/phone within reach;with family/visitor present;with nursing/sitter in room Nurse Communication: Mobility status PT Visit Diagnosis: Unsteadiness on feet (R26.81);History of falling (Z91.81);Pain Pain - Right/Left: Left Pain - part of body: Knee    Time: 8416-6063 PT Time Calculation (min) (ACUTE ONLY): 45 min   Charges:   PT Evaluation $PT Eval Moderate Complexity: 1 Mod  Blanchard Kelch PT Acute Rehabilitation Services Pager [phone] Office [phone]   Rada Hay 11/02/2020, 1:38 PM

## 2020-11-02 NOTE — Progress Notes (Signed)
Occupational Therapy Evaluation  Patient lives alone in a single level apartment, fully independent at baseline started new job in customer service. Patient does endorse falls "once every 3-4 months, I'm clumsy." Currently patient limited by L knee pain, weight bearing restrictions, decreased activity tolerance needing significant assistance for OOB mobility and ADL tasks. Patient mod A x2 for safety for LE management and initial trunk support to sit upright. With L leg in dependent position patient reports significant pain and becomes dizzy, therefore laid back into bed with max A x2 for safety to manage legs back into bed. Patient will need continued acute OT services to progress with mobility and compensatory strategies for ADL tasks, currently recommend short term rehab at [facility].     11/02/20 1400  OT Visit Information  Last OT Received On 11/02/20  Assistance Needed +2  PT/OT/SLP Co-Evaluation/Treatment Yes  Reason for Co-Treatment Complexity of the patient's impairments (multi-system involvement);For patient/therapist safety;To address functional/ADL transfers  PT goals addressed during session Mobility/safety with mobility  OT goals addressed during session ADL's and self-care  History of Present Illness Carla Little is an 30 y.o. female 30 year old female who is morbidly obese, history of depression and got up to go the bathroom and felt a pop in her knee and had sudden instability and was unable to get up, EMS transported to the ER and found to have a knee dislocation which was reduced with fractures of the proximal fibula ligamentous avulsion laterally as well as medially off the medial femoral condyle and MRI showed complete ACL tear, PCL tear and MCL strain and has a large effusion and is going to be in knee immobilizer.  Vascular ultrasounds done and nondiagnostic  Precautions  Precautions Fall;Knee  Precaution Comments Dizzy on eval when sat up  Required Braces or Orthoses Knee  Immobilizer - Left  Knee Immobilizer - Left On at all times  Restrictions  Weight Bearing Restrictions Yes  LLE Weight Bearing TWB  Other Position/Activity Restrictions per most recent ortho note patient TWB with immobilizer  Home Living  Family/patient expects to be discharged to: Private residence  Living Arrangements Alone  Available Help at Discharge Friend(s);Available PRN/intermittently  Type of Home Apartment  Home Access Level entry  Home Layout One level  Bathroom Shower/Tub Tub/shower unit  Tour manager None  Prior Function  Level of Independence Independent  Comments works in Insurance underwriter No difficulties  Pain Assessment  Pain Assessment Faces  Faces Pain Scale 8  Pain Location L LE in dependent position  Pain Descriptors / Indicators Grimacing;Discomfort  Pain Intervention(s) RN gave pain meds during session;Repositioned;Limited activity within patient's tolerance  Cognition  Arousal/Alertness Awake/alert  Behavior During Therapy WFL for tasks assessed/performed  Overall Cognitive Status Within Functional Limits for tasks assessed  Upper Extremity Assessment  Upper Extremity Assessment Overall WFL for tasks assessed  Lower Extremity Assessment  Lower Extremity Assessment Defer to PT evaluation  Cervical / Trunk Assessment  Cervical / Trunk Assessment Normal;Other exceptions  Cervical / Trunk Exceptions body habitus  ADL  Overall ADL's  Needs assistance/impaired  Eating/Feeding Independent;Bed level  Grooming Independent;Bed level  Upper Body Bathing Minimal assistance;Sitting;Bed level  Lower Body Bathing Maximal assistance;Bed level;Sitting/lateral leans  Upper Body Dressing  Minimal assistance;Sitting;Bed level  Lower Body Dressing Total assistance;Sitting/lateral leans;Bed level  Toilet Transfer Details (indicate cue type and reason) deferred, patient feeling dizzy and increased pain with  sitting upright at edge of bed  Toileting- Clothing  Manipulation and Hygiene Total assistance;Bed level  General ADL Comments patient needing increased assistance with self care due to painful L knee, weight bearing restrictions, body habitus  Bed Mobility  Overal bed mobility Needs Assistance  Bed Mobility Supine to Sit;Sit to Supine  Supine to sit Mod assist;HOB elevated  Sit to supine Max assist;+2 for safety/equipment  General bed mobility comments patient needing assistance to guide leg off edge of bed and for sitting trunk upright. max A to support legs back to bed due to feeling dizzy  Transfers  General transfer comment deferred  Balance  Overall balance assessment Needs assistance  Sitting-balance support Bilateral upper extremity supported;Feet unsupported  Sitting balance-Leahy Scale Poor  Sitting balance - Comments reliant on UE support  General Comments  General comments (skin integrity, edema, etc.) HR up to 130, O2 reading mid 80s on room air. RN made aware  OT - End of Session  Equipment Utilized During Treatment Left knee immobilizer  Activity Tolerance Patient limited by pain;Other (comment) (dizzy)  Patient left in bed;with call bell/phone within reach;with family/visitor present  Nurse Communication Mobility status  OT Assessment  OT Recommendation/Assessment Patient needs continued OT Services  OT Visit Diagnosis Other abnormalities of gait and mobility (R26.89);History of falling (Z91.81);Pain  Pain - Right/Left Left  Pain - part of body Knee  OT Problem List Decreased strength;Decreased activity tolerance;Impaired balance (sitting and/or standing);Decreased safety awareness;Decreased knowledge of use of DME or AE;Decreased knowledge of precautions;Pain;Obesity  OT Plan  OT Frequency (ACUTE ONLY) Min 2X/week  OT Treatment/Interventions (ACUTE ONLY) Self-care/ADL training;Therapeutic exercise;DME and/or AE instruction;Therapeutic activities;Patient/family  education;Balance training  AM-PAC OT "6 Clicks" Daily Activity Outcome Measure (Version 2)  Help from another person eating meals? 4  Help from another person taking care of personal grooming? 3  Help from another person toileting, which includes using toliet, bedpan, or urinal? 1  Help from another person bathing (including washing, rinsing, drying)? 2  Help from another person to put on and taking off regular upper body clothing? 3  Help from another person to put on and taking off regular lower body clothing? 1  6 Click Score 14  Progressive Mobility  What is the highest level of mobility based on the progressive mobility assessment? Level 2 (Chairfast) - Balance while sitting on edge of bed and cannot stand  Mobility Sit up in bed/chair position for meals  OT Recommendation  Follow Up Recommendations SNF  OT Equipment Other (comment) (to be determined, likely bariatric wheelchair)  Individuals Consulted  Consulted and Agree with Results and Recommendations Patient  Acute Rehab OT Goals  Patient Stated Goal I want to do  what I can to get up  OT Goal Formulation With patient  Time For Goal Achievement 11/16/20  Potential to Achieve Goals Good  OT Time Calculation  OT Start Time (ACUTE ONLY) 1030  OT Stop Time (ACUTE ONLY) 1111  OT Time Calculation (min) 41 min  OT General Charges  $OT Visit 1 Visit  OT Evaluation  $OT Eval Moderate Complexity 1 Mod  Written Expression  Dominant Hand Right   Marlyce Huge OT OT pager: [phone]

## 2020-11-02 NOTE — Consult Note (Signed)
Medical Consultation   Carla Little  ZOX:[id number]  DOB: 1990/08/28  DOA: 10/31/2020  PCP: Patient, No Pcp Per (Inactive)   Outpatient Specialists:  Requesting physician: Dr. [name]  Reason for consultation: Tachycardia and social and disposition concerns   History of Present Illness: Carla Little is an 30 y.o. female 30 year old female who is morbidly obese, history of depression and got up to go the bathroom and felt a pop in her knee and had sudden instability and was unable to get up, EMS transported to the ER and found to have a knee dislocation which was reduced with fractures of the proximal fibula ligamentous avulsion laterally as well as medially off the medial femoral condyle and MRI showed complete ACL tear, PCL tear and MCL strain and has a large effusion and is going to be in knee immobilizer.  Vascular ultrasounds done and nondiagnostic Cta after IV bolus failed twice.  Ortho admitted the patient, Vasc consulted and reassured that there is no thought of artery issue, pt is to maintain toe oxygen saturations above 95%. Internal medicine was consulted to help with tachycardia and social concerns as she is anticipated to be a disposition issue.  Socially, the patient lives at home and is quite sedentary given her weight and is said to have people bring food to her.  "She lives by herself in an apartment  now by herself ground floor, and in the past had problems with homeless issues or staying in a condemned building etc." But just started a job the day prior.  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    Pt states she just got off work and got off phone with her friend and moved quickly to the bathroom, stumbled and tried to catch herself quickly and did a "split", legs going front and back and heard the loudest pop she's ever heard in her life and felt instant pain.  In the ED, she is hemodynamically stable with heart rates  between 120 and 140 with 10 out of 10 pain, HR 120s/60s to 80s, satting well on room air, NA 139, K3.5, CO2 25, calcium 8.8, glucose 119, WBC 9.1, Hgb 8.6, MCV 81.9, PLT 502 hCG of 1 so unlikely pregnant. 8/9 5am nondiagnostic CTa of LE had two failed bolus attempts but shows no joint effusion. 8/9 8 am MRI of knee showed "Extensive knee injury including ACL and PCL tears, posterolateral corner injury, patellar dislocation injury, MCL sprain, multiple muscle tears, medial femoral bone contusion an avulsion fracture involving the fibular head. Large Joint effusion, no obvious meniscus tear".  Ortho admit, vascular consult  Of note, 04/2018 presented for suicidal ideation after having some progressively more serious passive thoughts and friend called EMS to have her present to ED and seen by psych and eventually was accepted [facility]  Impression/Recommendations Active Problems:   Left knee dislocation   Knee dislocation, left, initial encounter  30 y/o with severe obesity and mood disorders Knee dislocation, multiple ligament injuries and fractures with concern for possibly popliteal intimal artery tear and to my knowledge reassured by [place] but is still monitoring with oxygen saturation to toe which needs to stay on.  From ED start, patient has been tachycardic with pain.  Still awaiting an EKG.  IM consulted for tachy and help with social issues.  Tachycardia suspect from pain and anxiety awaiting EKG, doubtful  but considering blood loss anemia as below -- EKG, keep K4&Mg2, replaced orally x1, TSH, fT4,  --watch for presyncope or dizziness, keep eye on any formations of hematomas --telemetry  Knee dislocation, multiple ligament injuries and fractures with concern for possibly popliteal intimal artery tear and to my knowledge reassured by US - defer to orthopedics --knee immobilizer but surgery possibly on Friday, will get some preop labs --defer pain control to orthopedics, consider  scheduling tylenol --PT/OT --stay on top of bowel movements --placed labs for tomorrow --"Pulse ox on her toe which needs to remain on her toe and needs to remain above 95 currently at 100" Dr. Ophelia CharterYates note --Vit D, ?osteoporosis, started vit d  ?Acute blood loss,  ?Suspect tamponade of knee if bleeding "at risk for popliteal intimal artery tear." with preliminary reading without evidence of dissection by color doppler, nondiagnostic Cta Per Dr. Ophelia CharterYates "Discussed with patient risk for intimal injury to the artery with knee dislocation and potential for sudden arterial blockage with blood flow from the knee down which could require emergent surgery." -low normocytic anemia- but hemoglobin stables which is reassuring, one more at 3PM --Hemoglobin 12.82 years ago, then yesterday was 8.9 >8.6 --INR, reticulocytes, iron labs --type and cross 1 unit  Hyperglycemia-A1c  Difficult social situation and disposition concerns --social services involvement? --social worker consult  Depression - and history of suicidal ideation in 2020 - consider SSRI initiation as indicated. Hasn't taken meds for in 2 years. Acute on chronic elevated platelets-likely due to inflammation from above process, CTM Severe obesity - weight loss  Consider TOC for PCP establishment  Thank you for this consultation.  [facility] will follow the patient with you.   Review of Systems:  Review of Systems  Constitutional:  Negative for chills and fever.  HENT:  Negative for ear pain and hearing loss.   Eyes:  Negative for blurred vision.  Respiratory:  Negative for cough, shortness of breath and wheezing.   Cardiovascular:  Negative for chest pain and palpitations.  Gastrointestinal:  Negative for abdominal pain, heartburn, nausea and vomiting.  Genitourinary:  Negative for flank pain.  Musculoskeletal:  Positive for falls and joint pain. Negative for neck pain.  Skin:  Negative for rash.  Neurological:   Negative for dizziness and headaches.  Endo/Heme/Allergies:  Does not bruise/bleed easily.  Psychiatric/Behavioral:  Negative for depression and suicidal ideas. The patient is nervous/anxious.   As per HPI otherwise 10 point review of systems negative.    Past Medical History: Past Medical History:  Diagnosis Date   Depression    GERD (gastroesophageal reflux disease)    Obesity     Past Surgical History: History reviewed. No pertinent surgical history. Never had surgery before   Allergies:   Allergies  Allergen Reactions   Azithromycin Shortness Of Breath and Nausea And Vomiting     Social History:  reports that she has never smoked. She has never used smokeless tobacco. She reports current alcohol use. She reports that she does not use drugs.   Family History: History reviewed. No pertinent family history.  Denies any similar injuries in the family.  Physical Exam: Vitals:   11/02/20 0209 11/02/20 0300 11/02/20 0458 11/02/20 0842  BP: 122/81 120/88 (!) 112/53 112/60  Pulse: (!) 132 (!) 121 (!) 124 (!) 133  Resp: 14 (!) 22 18   Temp: 98.4 F (36.9 C)  98.1 F (36.7 C) 98.6 F (37 C)  TempSrc: Oral  Oral Oral  SpO2: 92%  94% 100% (!) 87%  Weight:      Height:        Constitutional: appears anxious but appropriate mood, does appear a little pale.  Nontoxic, but mostly comfortable. Alert and awake, oriented appearing, not in any acute distress. Severely obese but moving all 4 extremities Eyes: pupils equal, eomi grossly intact ENMT: external ears and nose appear normal, normal hearing or hard of hearing            Lips appears normal  Neck: neck appears normal, no masses, normal ROM, no thyromegaly, no JVD  CVS: tachycardic but no murmurs heard Respiratory:  clear to auscultation bilaterally, no wheezing, rales or rhonchi. Respiratory effort normal. No accessory muscle use.  Abdomen: soft nontender, nondistended, normal bowel sounds, no hepatosplenomegaly, no  hernias  Musculoskeletal: : mobilizer in place, left toe oxygen saturation on Neuro: Cranial nerves II-XII grossly intact, strength, sensation, reflexes Psych: judgement and insight appear normal, stable mood and affect, mental status Skin: no rashes or lesions or ulcers, no induration or nodules   Data reviewed:  I have personally reviewed following labs and imaging studies Labs:  CBC: Recent Labs  Lab 11/01/20 0150 11/02/20 0605  WBC 12.4* 9.1  HGB 8.9* 8.6*  HCT 30.1* 29.8*  MCV 80.1 81.9  PLT 577* 502*    Basic Metabolic Panel: Recent Labs  Lab 11/01/20 0150 11/02/20 0605  NA 140 139  K 3.4* 3.5  CL 104 104  CO2 27 25  GLUCOSE 107* 119*  BUN 10 7  CREATININE 0.70 0.63  CALCIUM 8.7* 8.8*   GFR Estimated Creatinine Clearance: 169.5 mL/min (by C-G formula based on SCr of 0.63 mg/dL). Liver Function Tests: No results for input(s): AST, ALT, ALKPHOS, BILITOT, PROT, ALBUMIN in the last 168 hours. No results for input(s): LIPASE, AMYLASE in the last 168 hours. No results for input(s): AMMONIA in the last 168 hours. Coagulation profile No results for input(s): INR, PROTIME in the last 168 hours.  Cardiac Enzymes: No results for input(s): CKTOTAL, CKMB, CKMBINDEX, TROPONINI in the last 168 hours. BNP: Invalid input(s): POCBNP CBG: No results for input(s): GLUCAP in the last 168 hours. D-Dimer No results for input(s): DDIMER in the last 72 hours. Hgb A1c No results for input(s): HGBA1C in the last 72 hours. Lipid Profile No results for input(s): CHOL, HDL, LDLCALC, TRIG, CHOLHDL, LDLDIRECT in the last 72 hours. Thyroid function studies No results for input(s): TSH, T4TOTAL, T3FREE, THYROIDAB in the last 72 hours.  Invalid input(s): FREET3 Anemia work up No results for input(s): VITAMINB12, FOLATE, FERRITIN, TIBC, IRON, RETICCTPCT in the last 72 hours. Urinalysis    Component Value Date/Time   COLORURINE YELLOW 01/10/2018 2032   APPEARANCEUR CLOUDY (A)  01/10/2018 2032   LABSPEC 1.033 (H) 01/10/2018 2032   PHURINE 5.0 01/10/2018 2032   GLUCOSEU NEGATIVE 01/10/2018 2032   HGBUR NEGATIVE 01/10/2018 2032   BILIRUBINUR NEGATIVE 01/10/2018 2032   KETONESUR 5 (A) 01/10/2018 2032   PROTEINUR 30 (A) 01/10/2018 2032   UROBILINOGEN 1.0 10/27/2013 2116   NITRITE NEGATIVE 01/10/2018 2032   LEUKOCYTESUR TRACE (A) 01/10/2018 2032     Microbiology Recent Results (from the past 240 hour(s))  Resp Panel by RT-PCR (Flu A&B, Covid) Nasopharyngeal Swab     Status: None   Collection Time: 11/01/20  9:58 AM   Specimen: Nasopharyngeal Swab; Nasopharyngeal(NP) swabs in vial transport medium  Result Value Ref Range Status   SARS Coronavirus 2 by RT PCR NEGATIVE NEGATIVE Final  Comment: (NOTE) SARS-CoV-2 target nucleic acids are NOT DETECTED.  The SARS-CoV-2 RNA is generally detectable in upper respiratory specimens during the acute phase of infection. The lowest concentration of SARS-CoV-2 viral copies this assay can detect is 138 copies/mL. A negative result does not preclude SARS-Cov-2 infection and should not be used as the sole basis for treatment or other patient management decisions. A negative result may occur with  improper specimen collection/handling, submission of specimen other than nasopharyngeal swab, presence of viral mutation(s) within the areas targeted by this assay, and inadequate number of viral copies(<138 copies/mL). A negative result must be combined with clinical observations, patient history, and epidemiological information. The expected result is Negative.  Fact Sheet for Patients:  BloggerCourse.com  Fact Sheet for Healthcare Providers:  SeriousBroker.it  This test is no t yet approved or cleared by the [place] FDA and  has been authorized for detection and/or diagnosis of SARS-CoV-2 by FDA under an Emergency Use Authorization (EUA). This EUA will remain  in  effect (meaning this test can be used) for the duration of the COVID-19 declaration under Section 564(b)(1) of the Act, 21 U.S.C.section 360bbb-3(b)(1), unless the authorization is terminated  or revoked sooner.       Influenza A by PCR NEGATIVE NEGATIVE Final   Influenza B by PCR NEGATIVE NEGATIVE Final    Comment: (NOTE) The Xpert Xpress SARS-CoV-2/FLU/RSV plus assay is intended as an aid in the diagnosis of influenza from Nasopharyngeal swab specimens and should not be used as a sole basis for treatment. Nasal washings and aspirates are unacceptable for Xpert Xpress SARS-CoV-2/FLU/RSV testing.  Fact Sheet for Patients: BloggerCourse.com  Fact Sheet for Healthcare Providers: SeriousBroker.it  This test is not yet approved or cleared by the [place] FDA and has been authorized for detection and/or diagnosis of SARS-CoV-2 by FDA under an Emergency Use Authorization (EUA). This EUA will remain in effect (meaning this test can be used) for the duration of the COVID-19 declaration under Section 564(b)(1) of the Act, 21 U.S.C. section 360bbb-3(b)(1), unless the authorization is terminated or revoked.  Performed at [facility], [address], [place], [place] [address]        Inpatient Medications:   Scheduled Meds:  sodium chloride   Intravenous Once   acetaminophen  1,000 mg Oral Q6H   docusate sodium  100 mg Oral BID   enoxaparin (LOVENOX) injection  40 mg Subcutaneous Q12H   Continuous Infusions:  sodium chloride 75 mL/hr at 11/02/20 0526   methocarbamol (ROBAXIN) IV       Radiological Exams on Admission: DG Tibia/Fibula Left  Result Date: 11/01/2020 CLINICAL DATA:  Trip and fall and heard a crack. Pain mostly about the knee and proximal lower leg. EXAM: LEFT TIBIA AND FIBULA - 2 VIEW COMPARISON:  None. FINDINGS: There is posterior dislocation of the femur with respect to the tibia. Apex  lateral angulation. Patella appears aligned with the distal femur. Suspected fracture about the proximal fibular neck. Ankle alignment and ankle mortise are preserved. Mild generalized soft tissue edema. IMPRESSION: Posterior dislocation of the femur with respect to the tibia. Suspected fracture about the proximal fibular neck. Electronically Signed   By: Narda Rutherford M.D.   On: 11/01/2020 00:03   CT ANGIO LOW EXTREM LEFT W &/OR WO CONTRAST  Result Date: 11/01/2020 CLINICAL DATA:  30 year old female status post left knee fracture dislocation last night after fall from bed. Status post reduction. EXAM: CT ANGIOGRAPHY LOWER LEFT EXTREMITY TECHNIQUE: Engineer, manufacturing systems  CT angiogram of the left lower extremity during bolus contrast administration. CONTRAST:  200 mL OMNIPAQUE IOHEXOL 350 MG/ML SOLN COMPARISON:  Left tib fib series 2339 hours yesterday and left knee series 0230 hours today. FINDINGS: Despite two separate contrast bolus attempts, there is poor arterial contrast bolus in the left lower extremity on this exam. The major arterial structures of the left lower extremity appear grossly patent. Excreted IV contrast is present in the urinary bladder on the 2nd attempt. No pelvic free fluid. Negative visible lower abdominal and pelvic viscera. Visible bony pelvis appears intact. Left femoral head normally located. Comminuted but largely nondisplaced fracture of the medial distal right femur at the condyle is in the area of the medial collateral ligament and appears to spare the articular surface as seen on series 7, image 117 and series 5, image 184. No knee joint effusion. Patella appears to be intact although is laterally subluxed on series 5, image 181. There is mild comminution with tiny bone fragments at the lateral tibial plateau relatively sparing the articular surface as seen on series 7, image 113. Otherwise the tibia is intact. Comminuted relatively nondisplaced fracture of the fibula head. Otherwise  the fibula is intact. Ankle joint alignment is preserved. There is soft tissue swelling and stranding along the superficial muscle air in subcutaneous tissues at the lateral joint space and tracking into the posterior proximal calf. No soft tissue gas identified. Review of the MIP images confirms the above findings. IMPRESSION: 1. Nondiagnostic CTA of the left lower extremity despite two separate contrast bolus attempts. 2. Comminuted but largely nondisplaced fractures of both the medial distal right femur and the lateral proximal tibia. Both fracture sites relatively spare the articular surfaces and may be sequelae of collateral ligamentous avulsion. 3. Lateral patellar subluxation but no patellar fracture. 4. Mildly comminuted but relatively nondisplaced fibular head fracture. 5. No knee joint effusion. Posttraumatic soft tissue swelling and stranding with no soft tissue gas. Electronically Signed   By: Odessa Fleming M.D.   On: 11/01/2020 05:59   MR KNEE LEFT WO CONTRAST  Result Date: 11/01/2020 CLINICAL DATA:  Larey Seat.  Knee dislocation. EXAM: MRI OF THE LEFT KNEE WITHOUT CONTRAST TECHNIQUE: Multiplanar, multisequence MR imaging of the knee was performed. No intravenous contrast was administered. COMPARISON:  Radiographs 11/01/2020 FINDINGS: Exam quite limited by body habitus and patient motion artifact. MENISCI Medial meniscus:  Grossly intact. Lateral meniscus:  Grossly intact. LIGAMENTS Cruciates: Complete ACL tear. Suspect PCL torn from its femoral attachment site. Collaterals: Proximal MCL strain. No complete tear. The fibular collateral ligament is torn. The iliotibial band is intact and the biceps femoris tendon is injured but not completely ruptured. The popliteus tendon is injured but I believe it is still intact. Suspect arcuate ligament tear and posterolateral corner injury. CARTILAGE Patellofemoral:  Grossly normal. Medial:  Intact Lateral:  Intact Joint:  Moderate to large joint effusion. Popliteal Fossa:  Extensive fluid/hematoma and edema involving the posterior compartment of the knee with medial and lateral gastroc muscle tears. Extensor Mechanism: The patella is significantly subluxed laterally. The medial retinaculum and medial patellofemoral ligaments are torn from the femoral attachment site. The TT-TG distance is 3 cm. Bones: Bone contusion involving the medial femoral condyle. There is also an avulsion type fracture involving the fibular head. Other: Medial and lateral gastroc muscle tears. The popliteus muscle is also torn and the anterior tibialis muscle is torn. IMPRESSION: 1. Exam quite limited by body habitus and patient motion artifact. 2. Extensive knee injury  including ACL and PCL tears, posterolateral corner injury, patellar dislocation injury, MCL sprain, multiple muscle tears, medial femoral bone contusion an avulsion fracture involving the fibular head. 3. No obvious meniscal tears but this could certainly be missed. 4. Large joint effusion. Electronically Signed   By: Rudie Meyer M.D.   On: 11/01/2020 08:28   DG Knee Left Port  Result Date: 11/01/2020 CLINICAL DATA:  Status post reduction EXAM: PORTABLE LEFT KNEE - 1-2 VIEW COMPARISON:  Films from previous day. FINDINGS: Prior knee dislocation has been reduced. The tibia and fibula demonstrate normal articulation. Avulsion is noted from the medial aspect of the distal femur as well as fracture through the fibular head. IMPRESSION: Interval reduction of dislocation. Proximal fibular fracture is again noted. Avulsion from the medial aspect of the distal femur is noted. Electronically Signed   By: Alcide Clever M.D.   On: 11/01/2020 02:45   DG Foot 2 Views Left  Result Date: 11/01/2020 CLINICAL DATA:  Fall, pain, injury. EXAM: LEFT FOOT - 2 VIEW COMPARISON:  None. FINDINGS: There is no evidence of fracture or dislocation. There is no evidence of arthropathy or other focal bone abnormality. Generalized soft tissue edema versus habitus.  IMPRESSION: No fracture or dislocation of the foot. Electronically Signed   By: Narda Rutherford M.D.   On: 11/01/2020 00:04   VAS [place] ABI WITH/WO TBI  Result Date: 11/01/2020  LOWER EXTREMITY DOPPLER STUDY Patient Name:  Carla Little  Date of Exam:   11/01/2020 Medical Rec #: [id number]          Accession #:    [id number] Date of Birth: 1990-08-28         Patient Gender: F Patient Age:   54 years Exam Location:  [facility] Procedure:      VAS [place] ABI WITH/WO TBI Referring Phys: Leonette Most FIELDS --------------------------------------------------------------------------------  Indications: S/p fall. Left ACL, PCL tears, patellar dislocation, MCL sprain              muscle tears; at risk for popliteal intimal artery tear.  Comparison Study: No prior study Performing Technologist: Gertie Fey MHA, RVT, RDCS, RDMS  Examination Guidelines: A complete evaluation includes at minimum, Doppler waveform signals and systolic blood pressure reading at the level of bilateral brachial, anterior tibial, and posterior tibial arteries, when vessel segments are accessible. Bilateral testing is considered an integral part of a complete examination. Photoelectric Plethysmograph (PPG) waveforms and toe systolic pressure readings are included as required and additional duplex testing as needed. Limited examinations for reoccurring indications may be performed as noted.  ABI Findings: +--------+------------------+-----+---------+--------+ Right   Rt Pressure (mmHg)IndexWaveform Comment  +--------+------------------+-----+---------+--------+ [id number]                    triphasic         +--------+------------------+-----+---------+--------+ PTA     115               0.86 triphasic         +--------+------------------+-----+---------+--------+ DP      154               1.15 triphasic         +--------+------------------+-----+---------+--------+  +--------+------------------+-----+---------+----------------------------------+ Left    Lt Pressure (mmHg)IndexWaveform Comment                            +--------+------------------+-----+---------+----------------------------------+ Brachial  triphasicUnable to obtain pressure due to                                           IV location                        +--------+------------------+-----+---------+----------------------------------+ PTA     150               1.12 triphasic                                   +--------+------------------+-----+---------+----------------------------------+ DP      132               0.99 triphasic                                   +--------+------------------+-----+---------+----------------------------------+ +-------+-----------+-----------+------------+------------+ ABI/TBIToday's ABIToday's TBIPrevious ABIPrevious TBI +-------+-----------+-----------+------------+------------+ Right  1.15                                           +-------+-----------+-----------+------------+------------+ Left   1.12                                           +-------+-----------+-----------+------------+------------+  Summary: Right: Resting right ankle-brachial index is within normal range. No evidence of significant right lower extremity arterial disease. Left: Resting left ankle-brachial index is within normal range. No evidence of significant left lower extremity arterial disease.  *See table(s) above for measurements and observations.     Preliminary    DG Femur Min 2 Views Left  Result Date: 11/01/2020 CLINICAL DATA:  Trip and fall and heard a crack. Pain mostly about the knee and proximal lower leg. EXAM: LEFT FEMUR 2 VIEWS COMPARISON:  None. FINDINGS: Knee dislocation is better assessed on concurrent tibia/fibula exam. No evidence of femur fracture. Hip alignment is grossly maintained. Soft tissue edema from  habitus limits detailed assessment. IMPRESSION: No fracture of the left femur. Knee dislocation better assessed on concurrent tibia/fibula exam. Electronically Signed   By: Narda Rutherford M.D.   On: 11/01/2020 00:02   VAS [place] LOWER EXTREMITY ARTERIAL DUPLEX  Result Date: 11/01/2020 LOWER EXTREMITY ARTERIAL DUPLEX STUDY Patient Name:  Carla Little  Date of Exam:   11/01/2020 Medical Rec #: [id number]          Accession #:    [id number] Date of Birth: 03/19/1991         Patient Gender: F Patient Age:   63 years Exam [facility]  [facility] Procedure:      VAS [place] LOWER EXTREMITY ARTERIAL DUPLEX Referring Phys: Leonette Most FIELDS --------------------------------------------------------------------------------  Indications: S/p fall. Left ACL, PCL tears, patellar dislocation, MCL sprain              muscle tears; at risk for popliteal intimal artery tear.  Current ABI: Right=1.15, Left=1.12 Limitations: Body habitus, restricted mobility, depth of vessels. Evaluation of              left popliteal artery limited to color and  pulsed wave Doppler              only; unable to evaluate using B-mode ultrasound, or linear array              transducer for better definition of intimal wall secondary to              technical limitations. Comparison Study: No prior study Performing Technologist: Gertie Fey MHA, RDMS, RVT, RDCS  Examination Guidelines: A complete evaluation includes B-mode imaging, spectral Doppler, color Doppler, and power Doppler as needed of all accessible portions of each vessel. Bilateral testing is considered an integral part of a complete examination. Limited examinations for reoccurring indications may be performed as noted.   +--------------+-------+-----------+---------+--------+-----+--------+ Left PoplitealAP (cm)Transv (cm)Waveform StenosisShapeComments +--------------+-------+-----------+---------+--------+-----+--------+ Mid                             triphasicnormal                 +--------------+-------+-----------+---------+--------+-----+--------+  Summary: Left: No evidence of hemodynamically significant stenosis. No obvious evidence of dissection by color Doppler.  See table(s) above for measurements and observations.    Preliminary       Time Spent: [address]  Charlane Ferretti DO [facility] 11/02/2020, 10:16 AM

## 2020-11-02 NOTE — Progress Notes (Signed)
Orthopedic Tech Progress Note Patient Details:  Carla Little 05/28/1990 [id number]  Patient ID: [name], female   DOB: May 14, 1990, 30 y.o.   MRN: [id number]  [name] 11/02/2020, 2:19 PM Overhead trapeze applied per order from [employer]

## 2020-11-02 NOTE — Progress Notes (Signed)
No urine output yet patient wants to wait and try again later. Educated patient it has been long time and might need I/O cath. Patient agreed

## 2020-11-02 NOTE — Plan of Care (Signed)

## 2020-11-02 NOTE — Progress Notes (Signed)
Patient ID: Carla Little, female   DOB: 12-12-1990, 30 y.o.   MRN: [id number] Knee immobilizer poor fit due to patient's body habitus.  Discussed other options and think it would be best to proceed with external fixator application across the knee to help hold her knee reduced with her multiple ligamentous avulsion injuries, ACL, PCL, posterior lateral corner, etc.  Discussed with patient plan procedures.  We will see if I can get it posted on Friday.  She can still mobilize with therapy now and just work on touchdown weightbearing with the left lower extremity as long she is got her knee immobilizer on secure.  Hospitalist consult with patient tachycardia, obesity and other medical issues.  Social service will need to get involved.  She lives by herself in an apartment  now by herself ground floor, and in the past had problems with homeless issues or staying in a condemned building etc.

## 2020-11-02 NOTE — ED Notes (Signed)
Pt's heart rate obtain manually, 120bpm.  Administered prescribed pain medications.  While in room, pt explaining that she has a hx of anxiety but has not been taking her medications for such and is feeling overwhelmed with everything that is happening.  Will continue to monitor, if no change in heart rate will contact admitting physician.

## 2020-11-03 ENCOUNTER — Encounter (HOSPITAL_COMMUNITY): Payer: Self-pay | Admitting: Certified Registered"

## 2020-11-03 ENCOUNTER — Inpatient Hospital Stay (HOSPITAL_COMMUNITY): Payer: Medicaid Other

## 2020-11-03 LAB — CBC
HCT: 26.9 % — ABNORMAL LOW (ref 36.0–46.0)
Hemoglobin: 7.9 g/dL — ABNORMAL LOW (ref 12.0–15.0)
MCH: 23.7 pg — ABNORMAL LOW (ref 26.0–34.0)
MCHC: 29.4 g/dL — ABNORMAL LOW (ref 30.0–36.0)
MCV: 80.8 fL (ref 80.0–100.0)
Platelets: 462 10*3/uL — ABNORMAL HIGH (ref 150–400)
RBC: 3.33 MIL/uL — ABNORMAL LOW (ref 3.87–5.11)
RDW: 21 % — ABNORMAL HIGH (ref 11.5–15.5)
WBC: 8.8 10*3/uL (ref 4.0–10.5)
nRBC: 0 % (ref 0.0–0.2)

## 2020-11-03 LAB — BASIC METABOLIC PANEL
Anion gap: 6 (ref 5–15)
BUN: 6 mg/dL (ref 6–20)
CO2: 28 mmol/L (ref 22–32)
Calcium: 8.2 mg/dL — ABNORMAL LOW (ref 8.9–10.3)
Chloride: 102 mmol/L (ref 98–111)
Creatinine, Ser: 0.51 mg/dL (ref 0.44–1.00)
GFR, Estimated: >60 mL/min (ref >60)
Glucose, Bld: 107 mg/dL — ABNORMAL HIGH (ref 70–99)
Potassium: 3.3 mmol/L — ABNORMAL LOW (ref 3.5–5.1)
Sodium: 136 mmol/L (ref 135–145)

## 2020-11-03 LAB — D-DIMER, QUANTITATIVE: D-Dimer, Quant: 0.84 ug/mL-FEU — ABNORMAL HIGH (ref 0.00–0.50)

## 2020-11-03 MED ORDER — CHLORHEXIDINE GLUCONATE CLOTH 2 % EX PADS
6.0000 | MEDICATED_PAD | Freq: Every day | CUTANEOUS | Status: DC
  Administered 2020-11-03 – 2020-11-12 (×7): 6 via TOPICAL

## 2020-11-03 MED ORDER — BETHANECHOL CHLORIDE 10 MG PO TABS
10.0000 mg | ORAL_TABLET | Freq: Three times a day (TID) | ORAL | Status: DC
  Administered 2020-11-03 – 2020-11-08 (×14): 10 mg via ORAL
  Filled 2020-11-03 (×15): qty 1

## 2020-11-03 MED ORDER — ALPRAZOLAM 0.25 MG PO TABS
0.2500 mg | ORAL_TABLET | Freq: Three times a day (TID) | ORAL | Status: DC | PRN
  Administered 2020-11-03 – 2020-11-27 (×27): 0.25 mg via ORAL
  Filled 2020-11-03 (×29): qty 1

## 2020-11-03 MED ORDER — MENTHOL 3 MG MT LOZG
1.0000 | LOZENGE | OROMUCOSAL | Status: DC | PRN
  Filled 2020-11-03 (×3): qty 9

## 2020-11-03 MED ORDER — IOHEXOL 350 MG/ML SOLN
100.0000 mL | Freq: Once | INTRAVENOUS | Status: AC | PRN
  Administered 2020-11-03: 100 mL via INTRAVENOUS

## 2020-11-03 MED ORDER — DIPHENHYDRAMINE HCL 25 MG PO CAPS
25.0000 mg | ORAL_CAPSULE | Freq: Once | ORAL | Status: AC
  Administered 2020-11-03: 25 mg via ORAL
  Filled 2020-11-03: qty 1

## 2020-11-03 MED ORDER — HYDROXYZINE HCL 25 MG PO TABS
25.0000 mg | ORAL_TABLET | Freq: Four times a day (QID) | ORAL | Status: DC | PRN
  Administered 2020-11-03 (×2): 25 mg via ORAL
  Filled 2020-11-03 (×2): qty 1

## 2020-11-03 MED ORDER — DIPHENHYDRAMINE HCL 25 MG PO CAPS
25.0000 mg | ORAL_CAPSULE | Freq: Four times a day (QID) | ORAL | Status: DC | PRN
  Administered 2020-11-05 – 2020-11-11 (×6): 25 mg via ORAL
  Filled 2020-11-03 (×6): qty 1

## 2020-11-03 NOTE — Progress Notes (Signed)
   11/03/20 1032  Urine Characteristics  Bladder Scan Volume (mL) 237 mL    No void since last in/out cath. Bladder scan performed, 237 mL observed.[place] with no urge to void.

## 2020-11-03 NOTE — Progress Notes (Signed)
Orthopedic Tech Progress Note Patient Details:  Carla Little [birth year] [id number]      Post Interventions Instructions Provided: Adjustment of device  Saul Fordyce 11/03/2020, 5:59 PMCalled [employer] for Bledsoe brace.

## 2020-11-03 NOTE — Progress Notes (Signed)
Patient still not peeing, per pt she is notholding it. Bladder scanned pt-showed 320. I&O yielded 380 with yellow amber color urine. Pt encourage to hydrate more.

## 2020-11-03 NOTE — Progress Notes (Addendum)
Carla Little  UXN:[id number] DOB: [birth year] DOA: 10/31/2020 PCP: Patient, No Pcp Per (Inactive)    Brief Narrative:  30 year old with a history of morbid obesity and depression who felt a pop in her knee when ambulating and experienced sudden instability with resultant mechanical fall.  She summoned EMS when she was unable to get up and in the ER she was found to have dislocated her knee.  This was able to be reduced in the ER but an MRI revealed a complete ACL tear, PCL tear, and MCL strain as well as a large effusion.  Vascular studies were done and fortunately no significant vascular injury was found.  Significant Events:  8/10 admit via ED  Consultants:  None  Code Status: FULL CODE  Antimicrobials:  None  DVT prophylaxis: SCDs  Subjective: Reports that she is very anxious.  Reports ongoing pain in the left knee.  Denies shortness of breath or chest pain.  Assessment & Plan:  L Knee dislocation ACL PCL tears posterior lateral corner injury, patellar dislocation, MCL sprain muscle muscle tears - care per Orthopedics - Plan is for knee immobilizer at all times kept on as tight as possible which barely fits her due to her leg size.  She will be in a knee immobilizer for couple weeks and then transferred to a Bledsoe brace  Persistent tachycardia - elevated d-dimer  Likely simply related to pain and anxiety - TSH elevated at 4.6 / free T4 normal at 1.0 -treat anxiety and follow - rule out PE but withhold anticoag for now due to high risk of bleeding into leg related to signif trauma   Acute hypoxic respiratory failure Saturations dipped as low as 84% during the night - rule out PE - monitor trend - could be OHS   Normocytic anemia Ferritin quite low at 11 -iron low at 24 with normal TIBC  Hypokalemia Supplement and follow  Hyperglycemia A1c 5.0 therefore not diabetic at this time  Vitamin D deficiency 25 hydroxy vitamin D low at 23  Morbid obesity - Body mass  index is 68.66 kg/m.  Depression  Difficult social situation/disposition Uninsured with no family to assist her   Family Communication:  Status is: Inpatient  Remains inpatient appropriate because:Inpatient level of care appropriate due to severity of illness  Dispo: The patient is from: Home              Anticipated d/c is to:  Unclear              Patient currently is not medically stable to d/c.   Difficult to place patient No    Objective: Blood pressure 123/89, pulse (!) 124, temperature 98.5 F (36.9 C), temperature source Oral, resp. rate 18, height 5\' 4"  (1.626 m), weight (!) 181.4 kg, last menstrual period 10/03/2020, SpO2 100 %.  Intake/Output Summary (Last 24 hours) at 11/03/2020 0936 Last data filed at 11/03/2020 0600 Gross per 24 hour  Intake 1642.71 ml  Output 1180 ml  Net 462.71 ml   Filed Weights   11/01/20 0026  Weight: (!) 181.4 kg    Examination: General: No acute respiratory distress Lungs: Clear to auscultation bilaterally without wheezes or crackles Cardiovascular: Tachycardic but regular without murmur or rub Abdomen: Nontender, nondistended, soft, bowel sounds positive, no rebound, no ascites, no appreciable mass -morbidly obese Extremities: No significant cyanosis, clubbing, or edema bilateral lower extremities  CBC: Recent Labs  Lab 11/02/20 1433 11/02/20 2150 11/03/20 0340  WBC 10.1 9.2 8.8  HGB  8.2* 8.1* 7.9*  HCT 27.6* 27.9* 26.9*  MCV 80.2 81.3 80.8  PLT 496* 495* 462*   Basic Metabolic Panel: Recent Labs  Lab 11/01/20 0150 11/02/20 0605 11/03/20 0340  NA 140 139 136  K 3.4* 3.5 3.3*  CL 104 104 102  CO2 27 25 28   GLUCOSE 107* 119* 107*  BUN 10 7 6   CREATININE 0.70 0.63 0.51  CALCIUM 8.7* 8.8* 8.2*  MG  --  1.9  --    GFR: Estimated Creatinine Clearance: 169.5 mL/min (by C-G formula based on SCr of 0.51 mg/dL).  Liver Function Tests: Recent Labs  Lab 11/02/20 1035  AST 41  ALT 31  ALKPHOS 61  BILITOT 1.2   PROT 7.3  ALBUMIN 3.1*     Coagulation Profile: Recent Labs  Lab 11/02/20 1035  INR 1.2     HbA1C: Hgb A1c MFr Bld  Date/Time Value Ref Range Status  11/02/2020 10:35 AM 5.0 4.8 - 5.6 % Final    Comment:    (NOTE) Pre diabetes:          5.7%-6.4%  Diabetes:              >6.4%  Glycemic control for   <7.0% adults with diabetes     Recent Results (from the past 240 hour(s))  Resp Panel by RT-PCR (Flu A&B, Covid) Nasopharyngeal Swab     Status: None   Collection Time: 11/01/20  9:58 AM   Specimen: Nasopharyngeal Swab; Nasopharyngeal(NP) swabs in vial transport medium  Result Value Ref Range Status   SARS Coronavirus 2 by RT PCR NEGATIVE NEGATIVE Final    Comment: (NOTE) SARS-CoV-2 target nucleic acids are NOT DETECTED.  The SARS-CoV-2 RNA is generally detectable in upper respiratory specimens during the acute phase of infection. The lowest concentration of SARS-CoV-2 viral copies this assay can detect is 138 copies/mL. A negative result does not preclude SARS-Cov-2 infection and should not be used as the sole basis for treatment or other patient management decisions. A negative result may occur with  improper specimen collection/handling, submission of specimen other than nasopharyngeal swab, presence of viral mutation(s) within the areas targeted by this assay, and inadequate number of viral copies(<138 copies/mL). A negative result must be combined with clinical observations, patient history, and epidemiological information. The expected result is Negative.  Fact Sheet for Patients:  01/02/2021  Fact Sheet for Healthcare Providers:  01/01/21  This test is no t yet approved or cleared by the [employer] FDA and  has been authorized for detection and/or diagnosis of SARS-CoV-2 by FDA under an Emergency Use Authorization (EUA). This EUA will remain  in effect (meaning this test can be used) for  the duration of the COVID-19 declaration under Section 564(b)(1) of the Act, 21 U.S.C.section 360bbb-3(b)(1), unless the authorization is terminated  or revoked sooner.       Influenza A by PCR NEGATIVE NEGATIVE Final   Influenza B by PCR NEGATIVE NEGATIVE Final    Comment: (NOTE) The Xpert Xpress SARS-CoV-2/FLU/RSV plus assay is intended as an aid in the diagnosis of influenza from Nasopharyngeal swab specimens and should not be used as a sole basis for treatment. Nasal washings and aspirates are unacceptable for Xpert Xpress SARS-CoV-2/FLU/RSV testing.  Fact Sheet for Patients: [employer]ious[employer]ker[employer]it  Fact Sheet for Healthcare Providers: [place]  This test is not yet approved or cleared by the [employer] FDA and has been authorized for detection and/or diagnosis of SARS-CoV-2 by FDA under an Emergency Use Authorization (EUA).  This EUA will remain in effect (meaning this test can be used) for the duration of the COVID-19 declaration under Section 564(b)(1) of the Act, 21 U.S.C. section 360bbb-3(b)(1), unless the authorization is terminated or revoked.  Performed at [facility], [address] [place], [place] [address]      Scheduled Meds:  sodium chloride   Intravenous Once   acetaminophen  1,000 mg Oral TID   cholecalciferol  2,000 Units Oral Daily   docusate sodium  100 mg Oral BID   enoxaparin (LOVENOX) injection  40 mg Subcutaneous Q12H   ferrous sulfate  325 mg Oral Q breakfast   Continuous Infusions:  sodium chloride 75 mL/hr at 11/03/20 0231   methocarbamol (ROBAXIN) IV       LOS: 2 days   Lonia Blood, MD [facility] Office  [phone] Pager - Text Page per Loretha Stapler  If 7PM-7AM, please contact night-coverage per Amion 11/03/2020, 9:36 AM

## 2020-11-03 NOTE — Progress Notes (Signed)
Patient ID: Carla Little, female   DOB: April 30, 1990, 30 y.o.   MRN: [id number] I have just seen the patient at the bedside and talked her in length in detail about the complex issue as it relates to her left knee.  3 days ago she sustained a dislocation of that left knee which required a closed reduction under sedation in the emergency room.  She has had CT angiogram to rule out any type of vascular injury and then a MRI of the left knee to assess the degree of ligamentous injury of her knee.  She understands that she does have a significant disruption of the ligaments that support the left knee and this creates an unstable knee.  She is currently in a knee immobilizer on the left lower extremity.  Therapy and nursing have mobilized her some.  She denies any numbness and tingling in her left foot and on my exam was able to flex and extend her left foot, ankle and toes.  She has normal sensation of her left foot and her foot is perfused and her calf is soft.  I did take off the knee immobilizer to just hold her knee up and make sure it is clinically located and her left knee is currently located.  She understands the complicating feature of her situation is also the fact that her BMI is 68.66.  I spoke to her about the possibility of placing an external fixator spanning the knee joint to hold it stable.  She understands that we need to have her knee scar down so it does not become unstable.  I do think a external fixation device will have some difficulty with her body habitus for when she sits up.  It is currently now 3 days since her injury and the knee is still located and she is showing no evidence thus far of any vascular injury.  She would like me to take over her orthopedic care from Dr. Ophelia Charter and I have spoken to him about this as well.  She is on the OR schedule tomorrow (Friday 8/12) for external fixation of the left lower extremity.  However, I plan to cancel that surgery and order a Bledsoe hinged knee  brace for her leg.  I will have her continue attempts to mobilize with therapy with going slow on that leg to hopefully get things to scar down with time.  I do plan on obtaining serial x-rays over the next few days to make sure that the knee is staying in place.  She understands that if the knee is significantly unstable that he would require external fixation.  I truly feel it is better to keep her in the hospital the next few days for optimizing her outcome overall.

## 2020-11-03 NOTE — Progress Notes (Signed)
Patent been ringing Yellow MEWS due to her increased HR ranging 120-130s. Pt is asymptomatic, not in any form of distress, afebrile. Charlane Ferretti MD, Hospitalist, spoke to Patient with orders. Given fluids and STAT ECG done. Will continue to monitor

## 2020-11-03 NOTE — Progress Notes (Signed)
Patient has not voided since being in and out cathed at approximately 0600 this morning, patient has some urge to void but after getting up on BSC could not pass any urine.  Has been in and out cathed x 2 prior to now.  Inserted 16 french foley as per protocol, received 300 mls amber urine.  MD notified that patient has had problems with retention since admission.

## 2020-11-03 NOTE — TOC Initial Note (Signed)
Transition of Care [facility]) - Initial/Assessment Note   Patient Details  Name: Carla Little MRN: [id number] Date of Birth: 10/18/1990  [facility]) CM/SW Contact:    Ewing Schlein, LCSW Phone Number: 11/03/2020, 12:08 PM  Clinical Narrative: PT evaluation recommended SNF, however, patient is uninsured. CSW reviewed patient's case with TOC supervisor and patient was tentatively approved for an LOG for SNF, but as patient does not have a disability qualifying diagnosis, finding a SNF that will accept an LOG will be difficult.  CSW referred patient to the financial counselor, but patient is not eligible for Medicaid at this time.  CSW spoke with patient to discuss PT's recommendations as well as barriers that will affect or prohibit placement. TOC to follow.  Expected Discharge Plan: Skilled Nursing Facility Barriers to Discharge: Continued Medical Work up, Inadequate or no insurance, SNF Pending bed offer  Patient Goals and CMS Choice Choice offered to / list presented to : Patient  Expected Discharge Plan and Services Expected Discharge Plan: Skilled Nursing Facility In-house Referral: Clinical Social Work, Ship broker Acute Care Choice: Skilled Nursing Facility Living arrangements for the past 2 months: Apartment           DME Arranged: N/A DME Agency: NA  Prior Living Arrangements/Services Living arrangements for the past 2 months: Apartment Lives with:: Self Patient language and need for interpreter reviewed:: Yes Need for Family Participation in Patient Care: Yes (Comment) Care giver support system in place?: No (comment) Criminal Activity/Legal Involvement Pertinent to Current Situation/Hospitalization: No - Comment as needed  Activities of Daily Living Home Assistive Devices/Equipment: None ADL Screening (condition at time of admission) Patient's cognitive ability adequate to safely complete daily activities?: Yes Is the patient deaf or have  difficulty hearing?: No Does the patient have difficulty seeing, even when wearing glasses/contacts?: No Does the patient have difficulty concentrating, remembering, or making decisions?: No Patient able to express need for assistance with ADLs?: Yes Does the patient have difficulty dressing or bathing?: Yes Independently performs ADLs?: No Communication: Independent Dressing (OT): Needs assistance Is this a change from baseline?: Change from baseline, expected to last >3 days Grooming: Independent Feeding: Needs assistance Is this a change from baseline?: Change from baseline, expected to last >3 days Bathing: Needs assistance Is this a change from baseline?: Change from baseline, expected to last >3 days Toileting: Dependent Is this a change from baseline?: Change from baseline, expected to last >3days In/Out Bed: Dependent Is this a change from baseline?: Change from baseline, expected to last >3 days Walks in Home: Dependent Is this a change from baseline?: Change from baseline, expected to last >3 days Does the patient have difficulty walking or climbing stairs?: Yes (secondary to left leg injury) Weakness of Legs: Left Weakness of Arms/Hands: None  Emotional Assessment Appearance:: Appears stated age Orientation: : Oriented to Self, Oriented to Place, Oriented to  Time, Oriented to Situation Alcohol / Substance Use: Not Applicable Psych Involvement: No (comment)  Admission diagnosis:  Dislocation closed [T14.8XXA] Fall [W19.XXXA] Left knee dislocation [S83.105A] Dislocation of left knee, initial encounter [S83.105A] Knee dislocation, left, initial encounter [S83.105A] Patient Active Problem List   Diagnosis Date Noted   Left knee dislocation 11/01/2020   Knee dislocation, left, initial encounter 11/01/2020   PCP:  Patient, No Pcp Per (Inactive) Pharmacy:   [id number] [place],  - [address] AT [facility] [address] [place] [address] Phone: [phone] Fax: [phone]  Readmission Risk Interventions No flowsheet data found.   

## 2020-11-03 NOTE — Progress Notes (Signed)
Physical Therapy Treatment Patient Details Name: Carla Little MRN: [id number] DOB: 1990/09/22 Today's Date: 11/03/2020    History of Present Illness Carla Little is an 30 y.o. female 30 year old female who is morbidly obese, history of depression and got up to go the bathroom and felt a pop in her knee and had sudden instability and was unable to get up, EMS transported to the ER and found to have a knee dislocation which was reduced with fractures of the proximal fibula ligamentous avulsion laterally as well as medially off the medial femoral condyle and MRI showed complete ACL tear, PCL tear and MCL strain and has a large effusion and is going to be in knee immobilizer.  Vascular ultrasounds done and nondiagnostic    PT Comments    Called to room by nursing to assist pt to [facility] to attempt void as pt has not since early am.   General bed mobility comments: pt stated she rolls better to her LEFT.  Using rail and momentum, pt was able to self transisition from supine to EOB with Min Assist to support L LE.  Returning back to bed same fashion/momuntum and assist to support L LE up onto bed. General transfer comment: first assisted from elevated bed to [facility] 1/4 pivot to her RIGHT without walker such taht pt could use hands to push self up then transfer to armrests of BSC.  Again, pt uses forward momentum to shift her body weight from posterior to anterior.  Pt was able to complete pivot and stay TTWB.  Pt pleased, "I did it".  Getting back to bed was more difficult as she had to pivot opposite way towards her LEFT.  Used Bari walker pt was able to perform self sit to stand using forward momentum with B hands already on walker to pull.  Pt unable to push from behind on [facility] armrest due to her BMI. Once upright, pt was able to partially "hop" a few turn steps to her LEFT then back to bed.  LEFT turn was much more difficult. Pt lives home alone and will need ST Rehab at SNF  Follow Up  Recommendations  SNF     Equipment Recommendations  Wheelchair cushion (measurements PT);Wheelchair (measurements PT);Rolling walker with 5" wheels    Recommendations for Other Services       Precautions / Restrictions Precautions Precautions: Fall;Knee Required Braces or Orthoses: Knee Immobilizer - Left Knee Immobilizer - Right: On at all times Restrictions Weight Bearing Restrictions: Yes LLE Weight Bearing: Touchdown weight bearing LLE Partial Weight Bearing Percentage or Pounds: per Dr Ophelia Charter Other Position/Activity Restrictions: per most recent ortho note patient TWB with immobilizer    Mobility  Bed Mobility Overal bed mobility: Needs Assistance       Supine to sit: Min guard;Supervision     General bed mobility comments: pt stated she rolls better to her LEFT.  Using rail and momentum, pt was able to self transisition from supine to EOB with Min Assist to support L LE.  Returning back to bed same fashion/momuntum and assist to support L LE up onto bed.    Transfers   Equipment used: Rolling walker (2 wheeled);None             General transfer comment: first assisted from elevated bed to [facility] 1/4 pivot to her RIGHT without walker such taht pt could use hands to push self up then transfer to armrests of BSC.  Again, pt uses forward momentum to shift  her body weight from posterior to anterior.  Pt was able to complete pivot and stay TTWB.  Pt pleased, "I did it".  Getting back to bed was more difficult as she had to pivot opposite way towards her LEFT.  Used Bari walker pt was able to perform self sit to stand using forward momentum with B hands already on walker to pull.  Pt unable to push from behind on [facility] armrest due to her BMI. Once upright, pt was able to partially "hop" a few turn steps to her LEFT then back to bed.  LEFT turn was much more difficult.  Ambulation/Gait             General Gait Details: transfers only due to effort and  TTWB   Stairs             Wheelchair Mobility    Modified Rankin (Stroke Patients Only)       Balance                                            Cognition Arousal/Alertness: Awake/alert Behavior During Therapy: WFL for tasks assessed/performed Overall Cognitive Status: Within Functional Limits for tasks assessed                                 General Comments: AxO x 3 yound lady with some fear/anxiety during transfer.  She did not recall TTWB so re educated.      Exercises      General Comments        Pertinent Vitals/Pain Pain Assessment: Faces Faces Pain Scale: Hurts even more Pain Location: L knee Pain Descriptors / Indicators: Grimacing;Discomfort Pain Intervention(s): Monitored during session;Repositioned    Home Living                      Prior Function            PT Goals (current goals can now be found in the care plan section) Progress towards PT goals: Progressing toward goals    Frequency    Min 2X/week      PT Plan Current plan remains appropriate    Co-evaluation              AM-PAC PT "6 Clicks" Mobility   Outcome Measure  Help needed turning from your back to your side while in a flat bed without using bedrails?: A Lot Help needed moving from lying on your back to sitting on the side of a flat bed without using bedrails?: A Lot Help needed moving to and from a bed to a chair (including a wheelchair)?: A Lot Help needed standing up from a chair using your arms (e.g., wheelchair or bedside chair)?: A Lot Help needed to walk in hospital room?: Total Help needed climbing 3-5 steps with a railing? : Total 6 Click Score: 10    End of Session Equipment Utilized During Treatment: Gait belt Activity Tolerance: Patient limited by fatigue Patient left: in bed;with call bell/phone within reach;with family/visitor present Nurse Communication: Mobility status PT Visit Diagnosis:  Unsteadiness on feet (R26.81);History of falling (Z91.81);Pain Pain - Right/Left: Left Pain - part of body: Knee     Time: 1340-1405 PT Time Calculation (min) (ACUTE ONLY): 25 min  Charges:  $Therapeutic Activity: 23-37 mins                     {  Rica Koyanagi  PTA Acute  Rehabilitation Services Pager      [phone] Office      [phone]

## 2020-11-04 ENCOUNTER — Encounter (HOSPITAL_COMMUNITY)
Admission: EM | Disposition: A | Discharge status: Home Health | Payer: Self-pay | Source: Home / Self Care | Attending: Internal Medicine

## 2020-11-04 LAB — COMPREHENSIVE METABOLIC PANEL
ALT: 30 U/L (ref 0–44)
AST: 44 U/L — ABNORMAL HIGH (ref 15–41)
Albumin: 3 g/dL — ABNORMAL LOW (ref 3.5–5.0)
Alkaline Phosphatase: 60 U/L (ref 38–126)
Anion gap: 9 (ref 5–15)
BUN: <5 mg/dL — ABNORMAL LOW (ref 6–20)
CO2: 28 mmol/L (ref 22–32)
Calcium: 8.5 mg/dL — ABNORMAL LOW (ref 8.9–10.3)
Chloride: 101 mmol/L (ref 98–111)
Creatinine, Ser: 0.51 mg/dL (ref 0.44–1.00)
GFR, Estimated: >60 mL/min (ref >60)
Glucose, Bld: 92 mg/dL (ref 70–99)
Potassium: 3.4 mmol/L — ABNORMAL LOW (ref 3.5–5.1)
Sodium: 138 mmol/L (ref 135–145)
Total Bilirubin: 1 mg/dL (ref 0.3–1.2)
Total Protein: 7.3 g/dL (ref 6.5–8.1)

## 2020-11-04 LAB — CBC
HCT: 27.8 % — ABNORMAL LOW (ref 36.0–46.0)
Hemoglobin: 8.2 g/dL — ABNORMAL LOW (ref 12.0–15.0)
MCH: 24.3 pg — ABNORMAL LOW (ref 26.0–34.0)
MCHC: 29.5 g/dL — ABNORMAL LOW (ref 30.0–36.0)
MCV: 82.5 fL (ref 80.0–100.0)
Platelets: 441 10*3/uL — ABNORMAL HIGH (ref 150–400)
RBC: 3.37 MIL/uL — ABNORMAL LOW (ref 3.87–5.11)
RDW: 21.2 % — ABNORMAL HIGH (ref 11.5–15.5)
WBC: 5.8 10*3/uL (ref 4.0–10.5)
nRBC: 0 % (ref 0.0–0.2)

## 2020-11-04 SURGERY — EXTERNAL FIXATION, LOWER EXTREMITY
Anesthesia: Choice | Laterality: Left

## 2020-11-04 MED ORDER — SODIUM CHLORIDE 0.9 % IV SOLN
25.0000 mg | Freq: Once | INTRAVENOUS | Status: AC
  Administered 2020-11-04: 25 mg via INTRAVENOUS
  Filled 2020-11-04: qty 0.5

## 2020-11-04 MED ORDER — POTASSIUM CHLORIDE CRYS ER 20 MEQ PO TBCR
40.0000 meq | EXTENDED_RELEASE_TABLET | Freq: Once | ORAL | Status: AC
  Administered 2020-11-04: 40 meq via ORAL
  Filled 2020-11-04: qty 2

## 2020-11-04 MED ORDER — ENOXAPARIN SODIUM 100 MG/ML IJ SOSY
0.5000 mg/kg | PREFILLED_SYRINGE | INTRAMUSCULAR | Status: DC
  Administered 2020-11-04 – 2020-11-28 (×25): 90 mg via SUBCUTANEOUS
  Filled 2020-11-04 (×25): qty 1

## 2020-11-04 MED ORDER — SODIUM CHLORIDE 0.9 % IV SOLN
500.0000 mg | Freq: Every day | INTRAVENOUS | Status: AC
  Administered 2020-11-04 – 2020-11-05 (×2): 500 mg via INTRAVENOUS
  Filled 2020-11-04 (×2): qty 10

## 2020-11-04 NOTE — Progress Notes (Signed)
Carla Little  JSE:[id number] DOB: October 07, 1990 DOA: 10/31/2020 PCP: Patient, No Pcp Per (Inactive)    Brief Narrative:  29yo with a history of morbid obesity and anxiety/depression who felt a pop in her knee when ambulating and experienced sudden instability with resultant mechanical fall.  She summoned EMS when she was unable to get up and in the ER she was found to have dislocated her knee.  This was able to be reduced in the ER but an MRI revealed a complete ACL tear, PCL tear, and MCL strain as well as a large effusion.  Vascular studies were done and fortunately no significant vascular injury was found.  Significant Events:  8/10 admit via ED  Consultants:  Orthopedic Surgery   Code Status: FULL CODE  Antimicrobials:  None  DVT prophylaxis: SCDs  Subjective: The patient states she is feeling much better today.  The new brace feels much more supportive per her report.  She denies significant pain in her knee.  She denies shortness of breath or chest pain.  Assessment & Plan:  L Knee dislocation ACL PCL tears posterior lateral corner injury, patellar dislocation, MCL sprain muscle muscle tears - care per Orthopedics - plan at present is to utilize a Bledsoe hinged knee brace w/ serial exams to assure the knee remains in place - if it becomes dislocated again she will require a trip to the OR for external fixation   Persistent tachycardia - elevated d-dimer  Likely simply related to pain and anxiety, as well as signif tissue injury - TSH elevated at 4.6 / free T4 normal at 1.0 - no signif PE noted on CTa chest   Acute urinary retention Required repeat in and out cath due to urinary retention - Foley catheter placed 8/11 -Urecholine initiated -plan to discontinue Foley catheter for voiding trials 8/13  Acute hypoxic respiratory failure Saturations dipped as low as 84% during the night - no large PE on CTa - likely due to OHS/SA -monitor  Normocytic anemia Ferritin quite low  at 11 - iron low at 24 with normal TIBC  -suspect this is simply due to menstrual loss - infuse IV Fe   Hypokalemia Supplement and follow  Hyperglycemia A1c 5.0 therefore not diabetic at this time  Vitamin D deficiency 25 hydroxy vitamin D low at 23  Severe Morbid obesity - Body mass index is 68.66 kg/m.  Depression  Difficult social situation/disposition Uninsured with no family to assist her - would benefit from SNF but placement will be difficult if not impossible - at risk for very prolonged hospital stay    Family Communication:  Status is: Inpatient  Remains inpatient appropriate because:Inpatient level of care appropriate due to severity of illness  Dispo: The patient is from: Home              Anticipated d/c is to:  Unclear              Patient currently is not medically stable to d/c.   Difficult to place patient Yes    Objective: Blood pressure 129/85, pulse (!) 111, temperature 98.1 F (36.7 C), temperature source Oral, resp. rate 15, height 5\' 4"  (1.626 m), weight (!) 181.4 kg, last menstrual period 10/03/2020, SpO2 99 %.  Intake/Output Summary (Last 24 hours) at 11/04/2020 0853 Last data filed at 11/04/2020 0600 Gross per 24 hour  Intake 1741.7 ml  Output 975 ml  Net 766.7 ml    Filed Weights   11/01/20 0026  Weight: 01/01/21)  181.4 kg    Examination: General: No acute respiratory distress Lungs: Clear to auscultation bilaterally -distant breath sounds due to body habitus Cardiovascular: Tachycardic but regular without murmur or rub Abdomen: Morbidly obese, soft, BS positive, no rebound Extremities: Chronic appearing 1+ edema bilateral lower extremities  CBC: Recent Labs  Lab 11/02/20 2150 11/03/20 0340 11/04/20 0408  WBC 9.2 8.8 5.8  HGB 8.1* 7.9* 8.2*  HCT 27.9* 26.9* 27.8*  MCV 81.3 80.8 82.5  PLT 495* 462* 441*    Basic Metabolic Panel: Recent Labs  Lab 11/02/20 0605 11/03/20 0340 11/04/20 0408  NA 139 136 138  K 3.5 3.3* 3.4*  CL  104 102 101  CO2 25 28 28   GLUCOSE 119* 107* 92  BUN 7 6 <5*  CREATININE 0.63 0.51 0.51  CALCIUM 8.8* 8.2* 8.5*  MG 1.9  --   --     GFR: Estimated Creatinine Clearance: 169.5 mL/min (by C-G formula based on SCr of 0.51 mg/dL).  Liver Function Tests: Recent Labs  Lab 11/02/20 1035 11/04/20 0408  AST 41 44*  ALT 31 30  ALKPHOS 61 60  BILITOT 1.2 1.0  PROT 7.3 7.3  ALBUMIN 3.1* 3.0*      Coagulation Profile: Recent Labs  Lab 11/02/20 1035  INR 1.2      HbA1C: Hgb A1c MFr Bld  Date/Time Value Ref Range Status  11/02/2020 10:35 AM 5.0 4.8 - 5.6 % Final    Comment:    (NOTE) Pre diabetes:          5.7%-6.4%  Diabetes:              >6.4%  Glycemic control for   <7.0% adults with diabetes     Recent Results (from the past 240 hour(s))  Resp Panel by RT-PCR (Flu A&B, Covid) Nasopharyngeal Swab     Status: None   Collection Time: 11/01/20  9:58 AM   Specimen: Nasopharyngeal Swab; Nasopharyngeal(NP) swabs in vial transport medium  Result Value Ref Range Status   SARS Coronavirus 2 by RT PCR NEGATIVE NEGATIVE Final    Comment: (NOTE) SARS-CoV-2 target nucleic acids are NOT DETECTED.  The SARS-CoV-2 RNA is generally detectable in upper respiratory specimens during the acute phase of infection. The lowest concentration of SARS-CoV-2 viral copies this assay can detect is 138 copies/mL. A negative result does not preclude SARS-Cov-2 infection and should not be used as the sole basis for treatment or other patient management decisions. A negative result may occur with  improper specimen collection/handling, submission of specimen other than nasopharyngeal swab, presence of viral mutation(s) within the areas targeted by this assay, and inadequate number of viral copies(<138 copies/mL). A negative result must be combined with clinical observations, patient history, and epidemiological information. The expected result is Negative.  Fact Sheet for Patients:   01/01/21  Fact Sheet for Healthcare Providers:  BloggerCourse.com  This test is no t yet approved or cleared by the [place] FDA and  has been authorized for detection and/or diagnosis of SARS-CoV-2 by FDA under an Emergency Use Authorization (EUA). This EUA will remain  in effect (meaning this test can be used) for the duration of the COVID-19 declaration under Section 564(b)(1) of the Act, 21 U.S.C.section 360bbb-3(b)(1), unless the authorization is terminated  or revoked sooner.       Influenza A by PCR NEGATIVE NEGATIVE Final   Influenza B by PCR NEGATIVE NEGATIVE Final    Comment: (NOTE) The Xpert Xpress SARS-CoV-2/FLU/RSV plus assay is intended as  an aid in the diagnosis of influenza from Nasopharyngeal swab specimens and should not be used as a sole basis for treatment. Nasal washings and aspirates are unacceptable for Xpert Xpress SARS-CoV-2/FLU/RSV testing.  Fact Sheet for Patients: BloggerCourse.com  Fact Sheet for Healthcare Providers: SeriousBroker.it  This test is not yet approved or cleared by the [place] FDA and has been authorized for detection and/or diagnosis of SARS-CoV-2 by FDA under an Emergency Use Authorization (EUA). This EUA will remain in effect (meaning this test can be used) for the duration of the COVID-19 declaration under Section 564(b)(1) of the Act, 21 U.S.C. section 360bbb-3(b)(1), unless the authorization is terminated or revoked.  Performed at [facility], [address] [place], [place] [address]      Scheduled Meds:  bethanechol  10 mg Oral TID   Chlorhexidine Gluconate Cloth  6 each Topical Daily   cholecalciferol  2,000 Units Oral Daily   docusate sodium  100 mg Oral BID   ferrous sulfate  325 mg Oral Q breakfast   Continuous Infusions:  sodium chloride 75 mL/hr at 11/04/20 0600    methocarbamol (ROBAXIN) IV       LOS: 3 days   Lonia Blood, MD [facility] Office  [phone] Pager - Text Page per Loretha Stapler  If 7PM-7AM, please contact night-coverage per Amion 11/04/2020, 8:53 AM

## 2020-11-04 NOTE — Progress Notes (Signed)
ANTICOAGULATION CONSULT NOTE - Initial Consult  Pharmacy Consult for Lovenox Indication: VTE prophylaxis  Allergies  Allergen Reactions   Azithromycin Shortness Of Breath and Nausea And Vomiting    Patient Measurements: Height: 5\' 4"  (162.6 cm) Weight: (!) 181.4 kg (400 lb) IBW/kg (Calculated) : 54.7 Heparin Dosing Weight:   Vital Signs: Temp: 98.1 F (36.7 C) (08/12 0448) Temp Source: Oral (08/12 0448) BP: 129/85 (08/12 0448) Pulse Rate: 111 (08/12 0448)  Labs: Recent Labs    11/02/20 0605 11/02/20 1035 11/02/20 1433 11/02/20 2150 11/03/20 0340 11/04/20 0408  HGB 8.6*  --    < > 8.1* 7.9* 8.2*  HCT 29.8*  --    < > 27.9* 26.9* 27.8*  PLT 502*  --    < > 495* 462* 441*  LABPROT  --  14.9  --   --   --   --   INR  --  1.2  --   --   --   --   CREATININE 0.63  --   --   --  0.51 0.51   < > = values in this interval not displayed.    Estimated Creatinine Clearance: 169.5 mL/min (by C-G formula based on SCr of 0.51 mg/dL).   Medical History: Past Medical History:  Diagnosis Date   Depression    GERD (gastroesophageal reflux disease)    Obesity     Medications:  Medications Prior to Admission  Medication Sig Dispense Refill Last Dose   ibuprofen (ADVIL,MOTRIN) 200 MG tablet Take 400 mg by mouth 2 (two) times daily as needed for headache, moderate pain or cramping.    Past Week    Assessment: 30 yo F s/p L knee dislocation s/p closed reduction under sedation in the ED in 8/8.  Goal of Therapy:  Anti-Xa level 0.6-1 units/ml 4hrs after LMWH dose given Monitor platelets by anticoagulation protocol: Yes   Plan:  Lovenox 0.5 mg/kg SQ q24 = 90 mg q24 for VTE px Pharmacy to sign off  10/8, Pharm.D 11/04/2020 10:19 AM

## 2020-11-04 NOTE — Progress Notes (Signed)
Patient ID: Carla Little, female   DOB: 11-19-1990, 31 y.o.   MRN: [id number] The patient now has a Bledsoe hinged knee brace locked in full extension on her left lower extremity.  She is sitting in a chair and states that it does feel comfortable.  She denies any numbness in her foot.  She is able to flex and extend her ankle and foot.  Her foot is well-perfused with normal sensation on that left side.  She is now on Lovenox as a blood thinner and Dr. Sharon Seller and I communicated about this.  My plan is to have the patient mobilize as much as possible with keeping the knee extended in the Bledsoe knee brace with the hopes that the knee will stay stable in the brace and eventually scarred down without the need for external fixation.  Thus far clinically her knee is again well located.  I will likely obtain new x-rays this weekend at some point.  She should be here through the weekend from a safety standpoint for mobility purposes.  She can be up with physical therapy.

## 2020-11-05 DIAGNOSIS — E871 Hypo-osmolality and hyponatremia: Secondary | ICD-10-CM

## 2020-11-05 DIAGNOSIS — Z6841 Body Mass Index (BMI) 40.0 and over, adult: Secondary | ICD-10-CM

## 2020-11-05 DIAGNOSIS — M238X9 Other internal derangements of unspecified knee: Secondary | ICD-10-CM

## 2020-11-05 DIAGNOSIS — R739 Hyperglycemia, unspecified: Secondary | ICD-10-CM

## 2020-11-05 DIAGNOSIS — J9601 Acute respiratory failure with hypoxia: Secondary | ICD-10-CM

## 2020-11-05 DIAGNOSIS — S83522A Sprain of posterior cruciate ligament of left knee, initial encounter: Secondary | ICD-10-CM

## 2020-11-05 DIAGNOSIS — S83512A Sprain of anterior cruciate ligament of left knee, initial encounter: Secondary | ICD-10-CM

## 2020-11-05 DIAGNOSIS — D696 Thrombocytopenia, unspecified: Secondary | ICD-10-CM

## 2020-11-05 LAB — CBC
HCT: 29.5 % — ABNORMAL LOW (ref 36.0–46.0)
Hemoglobin: 8.5 g/dL — ABNORMAL LOW (ref 12.0–15.0)
MCH: 23.7 pg — ABNORMAL LOW (ref 26.0–34.0)
MCHC: 28.8 g/dL — ABNORMAL LOW (ref 30.0–36.0)
MCV: 82.4 fL (ref 80.0–100.0)
Platelets: 507 10*3/uL — ABNORMAL HIGH (ref 150–400)
RBC: 3.58 MIL/uL — ABNORMAL LOW (ref 3.87–5.11)
RDW: 21.5 % — ABNORMAL HIGH (ref 11.5–15.5)
WBC: 9.9 10*3/uL (ref 4.0–10.5)
nRBC: 0.6 % — ABNORMAL HIGH (ref 0.0–0.2)

## 2020-11-05 LAB — BASIC METABOLIC PANEL
Anion gap: 7 (ref 5–15)
BUN: <5 mg/dL — ABNORMAL LOW (ref 6–20)
CO2: 28 mmol/L (ref 22–32)
Calcium: 8.5 mg/dL — ABNORMAL LOW (ref 8.9–10.3)
Chloride: 99 mmol/L (ref 98–111)
Creatinine, Ser: 0.48 mg/dL (ref 0.44–1.00)
GFR, Estimated: >60 mL/min (ref >60)
Glucose, Bld: 94 mg/dL (ref 70–99)
Potassium: 3.9 mmol/L (ref 3.5–5.1)
Sodium: 134 mmol/L — ABNORMAL LOW (ref 135–145)

## 2020-11-05 MED ORDER — SENNOSIDES-DOCUSATE SODIUM 8.6-50 MG PO TABS
1.0000 | ORAL_TABLET | Freq: Two times a day (BID) | ORAL | Status: DC
  Administered 2020-11-05 – 2020-11-11 (×14): 1 via ORAL
  Filled 2020-11-05 (×14): qty 1

## 2020-11-05 NOTE — Progress Notes (Signed)
PROGRESS NOTE  Carla Little JQB:[id number] DOB: [birth year]   PCP: Patient, No Pcp Per (Inactive)  Patient is from: Home  DOA: 10/31/2020 LOS: 4  Chief complaints:  Chief Complaint  Patient presents with   Leg Pain     Brief Narrative / Interim history: 30yo F with a history of morbid obesity and anxiety/depression who felt a pop in her knee when ambulating and experienced sudden instability with resultant mechanical fall.  She summoned EMS when she was unable to get up and in the ER she was found to have dislocated her knee.  This was able to be reduced in the ER but an MRI revealed a complete ACL tear, PCL tear, and MCL strain as well as a large effusion.  Vascular studies were done and fortunately no significant vascular injury was found.  Orthopedic surgery managing with Brace.  Subjective: Seen and examined earlier this morning.  She reports some itching and abdominal pain earlier this morning.  She attributes this to the IV iron she received earlier last night.  She says her abdomen feels sore.  She denies nausea or vomiting.  She denies chest pain or shortness of breath.  Objective: Vitals:   11/04/20 2012 11/05/20 0442 11/05/20 1152 11/05/20 1536  BP: (!) 147/80 (!) 122/45  119/84  Pulse: (!) 121 (!) 105  (!) 118  Resp: 16 16  18   Temp: 99.4 F (37.4 C) 99.5 F (37.5 C)  98.4 F (36.9 C)  TempSrc: Oral Oral  Oral  SpO2: 98% (!) 75% 97% 99%  Weight:      Height:        Intake/Output Summary (Last 24 hours) at 11/05/2020 1734 Last data filed at 11/05/2020 1611 Gross per 24 hour  Intake 570.17 ml  Output 1310 ml  Net -739.83 ml   Filed Weights   11/01/20 0026  Weight: (!) 181.4 kg    Examination:  GENERAL: No apparent distress.  Nontoxic. HEENT: MMM.  Vision and hearing grossly intact.  NECK: Supple.  No apparent JVD but limited exam due to body habitus.  RESP:  No IWOB.  Fair aeration bilaterally but limited exam due to body habitus. CVS:  RRR. Heart  sounds normal.  ABD/GI/GU: BS+. Abd soft, NTND.  Indwelling Foley in place. MSK/EXT:  Moves extremities.  Brace over LLE.  Neurovascular intact distally. SKIN: no apparent skin lesion or wound NEURO: Awake, alert and oriented appropriately.  No apparent focal neuro deficit. PSYCH: Calm. Normal affect.   Procedures:  None  Microbiology summarized: COVID-19 and influenza PCR nonreactive.  Assessment & Plan: Left knee dislocation/ACL and PCL tears/collateral dislocation/MCL sprain -Orthopedic surgery managing.   -Bledsoe hinged knee brace w/ serial exams to assure the knee remains in place  -Surgery with external fixation if further dislocation -Pain control and therapy   Persistent tachycardia - elevated d-dimer-likely from pain and anxiety.  CTA chest negative for PE.  TSH slightly elevated at 4.6. -Pain control as above   Acute urinary retention- -Voiding trial today -Continue Urecholine   Acute hypoxic respiratory failure: Concern about undiagnosed OHS/OSA -May need supplemental oxygen at night and when she for sleep  Iron deficiency anemia: H&H stable.  Ferritin low at 11.  Iron sat 6%.  TIBC 388. Recent Labs    11/01/20 0150 11/02/20 0605 11/02/20 1433 11/02/20 2150 11/03/20 0340 11/04/20 0408 11/05/20 0411  HGB 8.9* 8.6* 8.2* 8.1* 7.9* 8.2* 8.5*  -Received IV iron on 8/12. -Monitor H&H -P.o. iron supplementation  Hypokalemia: Resolved.  Hyperglycemia: Likely stress-induced.  A1c 5.0.  Resolved.  Hyponatremia: Unclear etiology.  Mild. -Recheck in the morning   Anxiety and depression: Stable. -Continue home meds   Thrombocytosis: Likely reactive. -Continue monitoring  Morbid obesity Body mass index is 68.66 kg/m.         DVT prophylaxis:  SCDs Start: 11/01/20 0953  Code Status: Full code Family Communication: Patient and/or RN. Available if any question.  Level of care: Med-Surg Status is: Inpatient  Remains inpatient appropriate  because:Ongoing active pain requiring inpatient pain management, Unsafe d/c plan, and Inpatient level of care appropriate due to severity of illness  Dispo: The patient is from: Home              Anticipated d/c is to: SNF              Patient currently is not medically stable to d/c.   Difficult to place patient No       Consultants:  Orthopedic surgery   Sch Meds:  Scheduled Meds:  bethanechol  10 mg Oral TID   Chlorhexidine Gluconate Cloth  6 each Topical Daily   cholecalciferol  2,000 Units Oral Daily   enoxaparin (LOVENOX) injection  0.5 mg/kg Subcutaneous Q24H   ferrous sulfate  325 mg Oral Q breakfast   senna-docusate  1 tablet Oral BID   Continuous Infusions:  methocarbamol (ROBAXIN) IV     PRN Meds:.acetaminophen, ALPRAZolam, diphenhydrAMINE, HYDROmorphone (DILAUDID) injection, menthol-cetylpyridinium, methocarbamol **OR** methocarbamol (ROBAXIN) IV, morphine injection, ondansetron **OR** ondansetron (ZOFRAN) IV, oxyCODONE, oxyCODONE, polyethylene glycol  Antimicrobials: Anti-infectives (From admission, onward)    None        I have personally reviewed the following labs and images: CBC: Recent Labs  Lab 11/02/20 1433 11/02/20 2150 11/03/20 0340 11/04/20 0408 11/05/20 0411  WBC 10.1 9.2 8.8 5.8 9.9  HGB 8.2* 8.1* 7.9* 8.2* 8.5*  HCT 27.6* 27.9* 26.9* 27.8* 29.5*  MCV 80.2 81.3 80.8 82.5 82.4  PLT 496* 495* 462* 441* 507*   BMP &GFR Recent Labs  Lab 11/01/20 0150 11/02/20 0605 11/03/20 0340 11/04/20 0408 11/05/20 0411  NA 140 139 136 138 134*  K 3.4* 3.5 3.3* 3.4* 3.9  CL 104 104 102 101 99  CO2 27 25 28 28 28   GLUCOSE 107* 119* 107* 92 94  BUN 10 7 6  <5* <5*  CREATININE 0.70 0.63 0.51 0.51 0.48  CALCIUM 8.7* 8.8* 8.2* 8.5* 8.5*  MG  --  1.9  --   --   --    Estimated Creatinine Clearance: 169.5 mL/min (by C-G formula based on SCr of 0.48 mg/dL). Liver & Pancreas: Recent Labs  Lab 11/02/20 1035 11/04/20 0408  AST 41 44*  ALT 31 30   ALKPHOS 61 60  BILITOT 1.2 1.0  PROT 7.3 7.3  ALBUMIN 3.1* 3.0*   No results for input(s): LIPASE, AMYLASE in the last 168 hours. No results for input(s): AMMONIA in the last 168 hours. Diabetic: No results for input(s): HGBA1C in the last 72 hours. No results for input(s): GLUCAP in the last 168 hours. Cardiac Enzymes: No results for input(s): CKTOTAL, CKMB, CKMBINDEX, TROPONINI in the last 168 hours. No results for input(s): PROBNP in the last 8760 hours. Coagulation Profile: Recent Labs  Lab 11/02/20 1035  INR 1.2   Thyroid Function Tests: No results for input(s): TSH, T4TOTAL, FREET4, T3FREE, THYROIDAB in the last 72 hours. Lipid Profile: No results for input(s): CHOL, HDL, LDLCALC, TRIG, CHOLHDL, LDLDIRECT in the last 72 hours. Anemia Panel:  No results for input(s): VITAMINB12, FOLATE, FERRITIN, TIBC, IRON, RETICCTPCT in the last 72 hours. Urine analysis:    Component Value Date/Time   COLORURINE YELLOW 01/10/2018 2032   APPEARANCEUR CLOUDY (A) 01/10/2018 2032   LABSPEC 1.033 (H) 01/10/2018 2032   PHURINE 5.0 01/10/2018 2032   GLUCOSEU NEGATIVE 01/10/2018 2032   HGBUR NEGATIVE 01/10/2018 2032   BILIRUBINUR NEGATIVE 01/10/2018 2032   KETONESUR 5 (A) 01/10/2018 2032   PROTEINUR 30 (A) 01/10/2018 2032   UROBILINOGEN 1.0 10/27/2013 2116   NITRITE NEGATIVE 01/10/2018 2032   LEUKOCYTESUR TRACE (A) 01/10/2018 2032   Sepsis Labs: Invalid input(s): PROCALCITONIN, LACTICIDVEN  Microbiology: Recent Results (from the past 240 hour(s))  Resp Panel by RT-PCR (Flu A&B, Covid) Nasopharyngeal Swab     Status: None   Collection Time: 11/01/20  9:58 AM   Specimen: Nasopharyngeal Swab; Nasopharyngeal(NP) swabs in vial transport medium  Result Value Ref Range Status   SARS Coronavirus 2 by RT PCR NEGATIVE NEGATIVE Final    Comment: (NOTE) SARS-CoV-2 target nucleic acids are NOT DETECTED.  The SARS-CoV-2 RNA is generally detectable in upper respiratory specimens during the  acute phase of infection. The lowest concentration of SARS-CoV-2 viral copies this assay can detect is 138 copies/mL. A negative result does not preclude SARS-Cov-2 infection and should not be used as the sole basis for treatment or other patient management decisions. A negative result may occur with  improper specimen collection/handling, submission of specimen other than nasopharyngeal swab, presence of viral mutation(s) within the areas targeted by this assay, and inadequate number of viral copies(<138 copies/mL). A negative result must be combined with clinical observations, patient history, and epidemiological information. The expected result is Negative.  Fact Sheet for Patients:  BloggerCourse.com  Fact Sheet for Healthcare Providers:  SeriousBroker.it  This test is no t yet approved or cleared by the [place] FDA and  has been authorized for detection and/or diagnosis of SARS-CoV-2 by FDA under an Emergency Use Authorization (EUA). This EUA will remain  in effect (meaning this test can be used) for the duration of the COVID-19 declaration under Section 564(b)(1) of the Act, 21 U.S.C.section 360bbb-3(b)(1), unless the authorization is terminated  or revoked sooner.       Influenza A by PCR NEGATIVE NEGATIVE Final   Influenza B by PCR NEGATIVE NEGATIVE Final    Comment: (NOTE) The Xpert Xpress SARS-CoV-2/FLU/RSV plus assay is intended as an aid in the diagnosis of influenza from Nasopharyngeal swab specimens and should not be used as a sole basis for treatment. Nasal washings and aspirates are unacceptable for Xpert Xpress SARS-CoV-2/FLU/RSV testing.  Fact Sheet for Patients: BloggerCourse.com  Fact Sheet for Healthcare Providers: SeriousBroker.it  This test is not yet approved or cleared by the [place] FDA and has been authorized for detection and/or  diagnosis of SARS-CoV-2 by FDA under an Emergency Use Authorization (EUA). This EUA will remain in effect (meaning this test can be used) for the duration of the COVID-19 declaration under Section 564(b)(1) of the Act, 21 U.S.C. section 360bbb-3(b)(1), unless the authorization is terminated or revoked.  Performed at [facility], [address], [place], [place] [address]     Radiology Studies: No results found.    Samanta Gal T. Oland Arquette [facility]  If 7PM-7AM, please contact night-coverage [email or web address] 11/05/2020, 5:34 PM

## 2020-11-05 NOTE — Progress Notes (Signed)
Physical Therapy Treatment Patient Details Name: Carla Little MRN: [id number] DOB: January 05, 1991 Today's Date: 11/05/2020    History of Present Illness Carla Little is an 30 y.o. female 30 year old female who is morbidly obese, history of depression and got up to go the bathroom and felt a pop in her knee and had sudden instability and was unable to get up, EMS transported to the ER and found to have a knee dislocation which was reduced with fractures of the proximal fibula ligamentous avulsion laterally as well as medially off the medial femoral condyle and MRI showed complete ACL tear, PCL tear and MCL strain and has a large effusion and is going to be in knee immobilizer.  Vascular ultrasounds done and nondiagnostic. As of 8/13 she was placed in a L Bledsoe brace with WBAT orders and no flexion of knee.    PT Comments    Pt with new L Bledsoe brace and new orders for WBAT and no Flexion of knee.  Upon entry to the room, her brace had slid down and had to be repositioned to get the hinge to the knee joint.  This was somewhat difficult due to body habitus and took several attempts.  Once positioned better, pt reported it felt much better.  She stood once from edge of recliner and states she felt a "pop" and had to sit back down. PT and PT tech did not hear anything. Nursing aware. Will continue to attempt mobilization. All goals still appropriate.  Follow Up Recommendations  SNF     Equipment Recommendations  Wheelchair cushion (measurements PT);Wheelchair (measurements PT);Rolling walker with 5" wheels    Recommendations for Other Services       Precautions / Restrictions Precautions Precautions: Fall;Knee (no FLEX L Knee) Required Braces or Orthoses: Other Brace (Bledsoe) Knee Immobilizer - Left: On at all times Restrictions Weight Bearing Restrictions: Yes LLE Weight Bearing: Touchdown weight bearing Other Position/Activity Restrictions: PT orders state attempt WBAT with L  Bledsoe brace with no flexion of L knee    Mobility  Bed Mobility               General bed mobility comments: Pt up in recliner upon entry.  L Bledsoe brace scooted down with distal part at heel of foot and hings well below knee.  PT and PT Tech re-adjusted to proper position and pt stated it felt better.  It was difficult to position due to body habitus.    Transfers Overall transfer level: Needs assistance Equipment used: Rolling walker (2 wheeled) Transfers: Sit to/from Stand Sit to Stand: Min assist;+2 safety/equipment         General transfer comment: Pt scooted out to edge of recliner with L knee extended.  When she transitioned to standing she stated she felt a pop in her knee and had to sit down.  Ambulation/Gait                 Stairs             Wheelchair Mobility    Modified Rankin (Stroke Patients Only)       Balance                                            Cognition Arousal/Alertness: Awake/alert Behavior During Therapy: WFL for tasks assessed/performed Overall Cognitive Status: Within Functional Limits for tasks assessed  Exercises      General Comments        Pertinent Vitals/Pain Pain Assessment: Faces Faces Pain Scale: Hurts whole lot Pain Location: L knee Pain Descriptors / Indicators: Grimacing;Discomfort Pain Intervention(s): Limited activity within patient's tolerance;Premedicated before session;Repositioned;Monitored during session;Relaxation    Home Living                      Prior Function            PT Goals (current goals can now be found in the care plan section) Acute Rehab PT Goals Patient Stated Goal: To get moving Potential to Achieve Goals: Fair Progress towards PT goals: Progressing toward goals    Frequency    Min 2X/week      PT Plan Current plan remains appropriate    Co-evaluation               AM-PAC PT "6 Clicks" Mobility   Outcome Measure  Help needed turning from your back to your side while in a flat bed without using bedrails?: A Lot Help needed moving from lying on your back to sitting on the side of a flat bed without using bedrails?: A Lot Help needed moving to and from a bed to a chair (including a wheelchair)?: A Lot Help needed standing up from a chair using your arms (e.g., wheelchair or bedside chair)?: A Lot Help needed to walk in hospital room?: Total Help needed climbing 3-5 steps with a railing? : Total 6 Click Score: 10    End of Session Equipment Utilized During Treatment: Left knee immobilizer Activity Tolerance: Patient limited by pain Patient left: in chair;with call bell/phone within reach Nurse Communication: Mobility status PT Visit Diagnosis: Unsteadiness on feet (R26.81);History of falling (Z91.81);Pain Pain - Right/Left: Left Pain - part of body: Knee     Time: 2119-4174 PT Time Calculation (min) (ACUTE ONLY): 26 min  Charges:  $Therapeutic Activity: 23-37 mins                     Rhesa Forsberg L. Katrinka Blazing, PT  11/05/2020    Enzo Montgomery 11/05/2020, 12:06 PM

## 2020-11-06 ENCOUNTER — Inpatient Hospital Stay (HOSPITAL_COMMUNITY): Payer: Medicaid Other

## 2020-11-06 LAB — CBC
HCT: 28.5 % — ABNORMAL LOW (ref 36.0–46.0)
Hemoglobin: 8.4 g/dL — ABNORMAL LOW (ref 12.0–15.0)
MCH: 24.3 pg — ABNORMAL LOW (ref 26.0–34.0)
MCHC: 29.5 g/dL — ABNORMAL LOW (ref 30.0–36.0)
MCV: 82.6 fL (ref 80.0–100.0)
Platelets: 477 10*3/uL — ABNORMAL HIGH (ref 150–400)
RBC: 3.45 MIL/uL — ABNORMAL LOW (ref 3.87–5.11)
RDW: 21.4 % — ABNORMAL HIGH (ref 11.5–15.5)
WBC: 7.8 10*3/uL (ref 4.0–10.5)
nRBC: 0.9 % — ABNORMAL HIGH (ref 0.0–0.2)

## 2020-11-06 LAB — RENAL FUNCTION PANEL
Albumin: 3 g/dL — ABNORMAL LOW (ref 3.5–5.0)
Anion gap: 8 (ref 5–15)
BUN: 6 mg/dL (ref 6–20)
CO2: 31 mmol/L (ref 22–32)
Calcium: 8.6 mg/dL — ABNORMAL LOW (ref 8.9–10.3)
Chloride: 97 mmol/L — ABNORMAL LOW (ref 98–111)
Creatinine, Ser: 0.6 mg/dL (ref 0.44–1.00)
GFR, Estimated: >60 mL/min (ref >60)
Glucose, Bld: 114 mg/dL — ABNORMAL HIGH (ref 70–99)
Phosphorus: 2.3 mg/dL — ABNORMAL LOW (ref 2.5–4.6)
Potassium: 3.2 mmol/L — ABNORMAL LOW (ref 3.5–5.1)
Sodium: 136 mmol/L (ref 135–145)

## 2020-11-06 LAB — TYPE AND SCREEN
ABO/RH(D): O POS
Antibody Screen: NEGATIVE
Unit division: 0

## 2020-11-06 LAB — MAGNESIUM: Magnesium: 2 mg/dL (ref 1.7–2.4)

## 2020-11-06 LAB — BPAM RBC
Blood Product Expiration Date: 202209142359
Unit Type and Rh: 5100

## 2020-11-06 MED ORDER — POTASSIUM CHLORIDE CRYS ER 20 MEQ PO TBCR
40.0000 meq | EXTENDED_RELEASE_TABLET | ORAL | Status: AC
Start: 2020-11-06 — End: 2020-11-06
  Administered 2020-11-06 (×2): 40 meq via ORAL
  Filled 2020-11-06 (×2): qty 2

## 2020-11-06 NOTE — Progress Notes (Signed)
PROGRESS NOTE  Carla Little TGG:[id number] DOB: 1990/12/12   PCP: Patient, No Pcp Per (Inactive)  Patient is from: Home  DOA: 10/31/2020 LOS: 5  Chief complaints:  Chief Complaint  Patient presents with   Leg Pain     Brief Narrative / Interim history: 30yo F with a history of morbid obesity and anxiety/depression who felt a pop in her knee when ambulating and experienced sudden instability with resultant mechanical fall.  She summoned EMS when she was unable to get up and in the ER she was found to have dislocated her knee.  This was able to be reduced in the ER but an MRI revealed a complete ACL tear, PCL tear, and MCL strain as well as a large effusion.  Vascular studies were done and fortunately no significant vascular injury was found.  Orthopedic surgery managing with Brace.  Subjective: Seen and examined earlier this morning.  No major events overnight of this morning.  She got up with therapy yesterday.  She says she had some popping in the left knee but able to maneuver around after that.  Pain fairly controlled.  Denies chest pain or dyspnea.  Some dysuria after removal of Foley.  Denies numbness or tingling.  Objective: Vitals:   11/05/20 2007 11/06/20 0532 11/06/20 0634 11/06/20 1038  BP: 118/63 121/66    Pulse: (!) 112 (!) 118    Resp: 16 15    Temp: 98.5 F (36.9 C) 98.4 F (36.9 C)    TempSrc: Oral Oral    SpO2: 98% (!) 86% 92% 96%  Weight:      Height:        Intake/Output Summary (Last 24 hours) at 11/06/2020 1310 Last data filed at 11/06/2020 1206 Gross per 24 hour  Intake 500 ml  Output 650 ml  Net -150 ml   Filed Weights   11/01/20 0026  Weight: (!) 181.4 kg    Examination:  GENERAL: No apparent distress.  Nontoxic. HEENT: MMM.  Vision and hearing grossly intact.  NECK: Supple.   RESP: On RA.  No IWOB.  Fair aeration bilaterally. CVS:  RRR. Heart sounds normal.  ABD/GI/GU: BS+. Abd soft, NTND.  MSK/EXT:  Moves extremities.  Brace over  LLE.  Neurovascular intact distally. SKIN: no apparent skin lesion or wound NEURO: Awake and alert. Oriented appropriately.  No apparent focal neuro deficit. PSYCH: Calm. Normal affect.   Procedures:  None  Microbiology summarized: COVID-19 and influenza PCR nonreactive.  Assessment & Plan: Left knee dislocation/ACL and PCL tears/collateral dislocation/MCL sprain -Orthopedic surgery managing.   -Bledsoe hinged knee brace w/ serial exams to assure the knee remains in place  -Surgery with external fixation if further dislocation -Continue pain control and therapy   Persistent tachycardia - elevated d-dimer-likely from pain and anxiety.  CTA chest negative for PE.  TSH slightly elevated at 4.6. -Pain control as above   Acute urinary retention-resolved. -Continue Urecholine   Acute hypoxic respiratory failure: Concern about undiagnosed OHS/OSA -May need supplemental oxygen at night and when she for sleep -Needs outpatient sleep study.  Iron deficiency anemia: H&H stable.  Ferritin low at 11.  Iron sat 6%.  TIBC 388. Recent Labs    11/01/20 0150 11/02/20 0605 11/02/20 1433 11/02/20 2150 11/03/20 0340 11/04/20 0408 11/05/20 0411 11/06/20 0331  HGB 8.9* 8.6* 8.2* 8.1* 7.9* 8.2* 8.5* 8.4*  -Received IV iron on 8/12. -Monitor H&H -P.o. iron supplementation  Hypokalemia: K3.2. -K-Dur 40 mill equivalent x2   Hyperglycemia: Likely stress-induced.  A1c 5.0.  Resolved.  Hyponatremia: Resolved.   Anxiety and depression: Stable. -Continue home meds   Thrombocytosis: Likely reactive.  Improved. -Continue monitoring  Morbid obesity Body mass index is 68.66 kg/m.         DVT prophylaxis:  SCDs Start: 11/01/20 0953  Code Status: Full code Family Communication: Patient and/or RN. Available if any question.  Level of care: Med-Surg Status is: Inpatient  Remains inpatient appropriate because:Ongoing active pain requiring inpatient pain management, Unsafe d/c plan, and  Inpatient level of care appropriate due to severity of illness  Dispo: The patient is from: Home              Anticipated d/c is to: SNF              Patient currently is not medically stable to d/c.   Difficult to place patient Yes due to lack of insurance.  Potential for Medicaid.       Consultants:  Orthopedic surgery   Sch Meds:  Scheduled Meds:  bethanechol  10 mg Oral TID   Chlorhexidine Gluconate Cloth  6 each Topical Daily   cholecalciferol  2,000 Units Oral Daily   enoxaparin (LOVENOX) injection  0.5 mg/kg Subcutaneous Q24H   ferrous sulfate  325 mg Oral Q breakfast   senna-docusate  1 tablet Oral BID   Continuous Infusions:  methocarbamol (ROBAXIN) IV     PRN Meds:.acetaminophen, ALPRAZolam, diphenhydrAMINE, HYDROmorphone (DILAUDID) injection, menthol-cetylpyridinium, methocarbamol **OR** methocarbamol (ROBAXIN) IV, morphine injection, ondansetron **OR** ondansetron (ZOFRAN) IV, oxyCODONE, oxyCODONE, polyethylene glycol  Antimicrobials: Anti-infectives (From admission, onward)    None        I have personally reviewed the following labs and images: CBC: Recent Labs  Lab 11/02/20 2150 11/03/20 0340 11/04/20 0408 11/05/20 0411 11/06/20 0331  WBC 9.2 8.8 5.8 9.9 7.8  HGB 8.1* 7.9* 8.2* 8.5* 8.4*  HCT 27.9* 26.9* 27.8* 29.5* 28.5*  MCV 81.3 80.8 82.5 82.4 82.6  PLT 495* 462* 441* 507* 477*   BMP &GFR Recent Labs  Lab 11/02/20 0605 11/03/20 0340 11/04/20 0408 11/05/20 0411 11/06/20 0331  NA 139 136 138 134* 136  K 3.5 3.3* 3.4* 3.9 3.2*  CL 104 102 101 99 97*  CO2 25 28 28 28 31   GLUCOSE 119* 107* 92 94 114*  BUN 7 6 <5* <5* 6  CREATININE 0.63 0.51 0.51 0.48 0.60  CALCIUM 8.8* 8.2* 8.5* 8.5* 8.6*  MG 1.9  --   --   --  2.0  PHOS  --   --   --   --  2.3*   Estimated Creatinine Clearance: 169.5 mL/min (by C-G formula based on SCr of 0.6 mg/dL). Liver & Pancreas: Recent Labs  Lab 11/02/20 1035 11/04/20 0408 11/06/20 0331  AST 41 44*   --   ALT 31 30  --   ALKPHOS 61 60  --   BILITOT 1.2 1.0  --   PROT 7.3 7.3  --   ALBUMIN 3.1* 3.0* 3.0*   No results for input(s): LIPASE, AMYLASE in the last 168 hours. No results for input(s): AMMONIA in the last 168 hours. Diabetic: No results for input(s): HGBA1C in the last 72 hours. No results for input(s): GLUCAP in the last 168 hours. Cardiac Enzymes: No results for input(s): CKTOTAL, CKMB, CKMBINDEX, TROPONINI in the last 168 hours. No results for input(s): PROBNP in the last 8760 hours. Coagulation Profile: Recent Labs  Lab 11/02/20 1035  INR 1.2   Thyroid Function Tests: No results  for input(s): TSH, T4TOTAL, FREET4, T3FREE, THYROIDAB in the last 72 hours. Lipid Profile: No results for input(s): CHOL, HDL, LDLCALC, TRIG, CHOLHDL, LDLDIRECT in the last 72 hours. Anemia Panel: No results for input(s): VITAMINB12, FOLATE, FERRITIN, TIBC, IRON, RETICCTPCT in the last 72 hours. Urine analysis:    Component Value Date/Time   COLORURINE YELLOW 01/10/2018 2032   APPEARANCEUR CLOUDY (A) 01/10/2018 2032   LABSPEC 1.033 (H) 01/10/2018 2032   PHURINE 5.0 01/10/2018 2032   GLUCOSEU NEGATIVE 01/10/2018 2032   HGBUR NEGATIVE 01/10/2018 2032   BILIRUBINUR NEGATIVE 01/10/2018 2032   KETONESUR 5 (A) 01/10/2018 2032   PROTEINUR 30 (A) 01/10/2018 2032   UROBILINOGEN 1.0 10/27/2013 2116   NITRITE NEGATIVE 01/10/2018 2032   LEUKOCYTESUR TRACE (A) 01/10/2018 2032   Sepsis Labs: Invalid input(s): PROCALCITONIN, LACTICIDVEN  Microbiology: Recent Results (from the past 240 hour(s))  Resp Panel by RT-PCR (Flu A&B, Covid) Nasopharyngeal Swab     Status: None   Collection Time: 11/01/20  9:58 AM   Specimen: Nasopharyngeal Swab; Nasopharyngeal(NP) swabs in vial transport medium  Result Value Ref Range Status   SARS Coronavirus 2 by RT PCR NEGATIVE NEGATIVE Final    Comment: (NOTE) SARS-CoV-2 target nucleic acids are NOT DETECTED.  The SARS-CoV-2 RNA is generally detectable in  upper respiratory specimens during the acute phase of infection. The lowest concentration of SARS-CoV-2 viral copies this assay can detect is 138 copies/mL. A negative result does not preclude SARS-Cov-2 infection and should not be used as the sole basis for treatment or other patient management decisions. A negative result may occur with  improper specimen collection/handling, submission of specimen other than nasopharyngeal swab, presence of viral mutation(s) within the areas targeted by this assay, and inadequate number of viral copies(<138 copies/mL). A negative result must be combined with clinical observations, patient history, and epidemiological information. The expected result is Negative.  Fact Sheet for Patients:  BloggerCourse.com  Fact Sheet for Healthcare Providers:  SeriousBroker.it  This test is no t yet approved or cleared by the [place] FDA and  has been authorized for detection and/or diagnosis of SARS-CoV-2 by FDA under an Emergency Use Authorization (EUA). This EUA will remain  in effect (meaning this test can be used) for the duration of the COVID-19 declaration under Section 564(b)(1) of the Act, 21 U.S.C.section 360bbb-3(b)(1), unless the authorization is terminated  or revoked sooner.       Influenza A by PCR NEGATIVE NEGATIVE Final   Influenza B by PCR NEGATIVE NEGATIVE Final    Comment: (NOTE) The Xpert Xpress SARS-CoV-2/FLU/RSV plus assay is intended as an aid in the diagnosis of influenza from Nasopharyngeal swab specimens and should not be used as a sole basis for treatment. Nasal washings and aspirates are unacceptable for Xpert Xpress SARS-CoV-2/FLU/RSV testing.  Fact Sheet for Patients: BloggerCourse.com  Fact Sheet for Healthcare Providers: SeriousBroker.it  This test is not yet approved or cleared by the [place] FDA and has been  authorized for detection and/or diagnosis of SARS-CoV-2 by FDA under an Emergency Use Authorization (EUA). This EUA will remain in effect (meaning this test can be used) for the duration of the COVID-19 declaration under Section 564(b)(1) of the Act, 21 U.S.C. section 360bbb-3(b)(1), unless the authorization is terminated or revoked.  Performed at [facility], [address], [place], [place] [address]     Radiology Studies: No results found.    Intisar Claudio T. Denvil Canning [facility]  If 7PM-7AM, please contact night-coverage [email or web address] 11/06/2020, 1:10  PM

## 2020-11-07 LAB — RENAL FUNCTION PANEL
Albumin: 3.1 g/dL — ABNORMAL LOW (ref 3.5–5.0)
Anion gap: 8 (ref 5–15)
BUN: <5 mg/dL — ABNORMAL LOW (ref 6–20)
CO2: 30 mmol/L (ref 22–32)
Calcium: 8.9 mg/dL (ref 8.9–10.3)
Chloride: 100 mmol/L (ref 98–111)
Creatinine, Ser: 0.65 mg/dL (ref 0.44–1.00)
GFR, Estimated: >60 mL/min (ref >60)
Glucose, Bld: 105 mg/dL — ABNORMAL HIGH (ref 70–99)
Phosphorus: 2.7 mg/dL (ref 2.5–4.6)
Potassium: 3.6 mmol/L (ref 3.5–5.1)
Sodium: 138 mmol/L (ref 135–145)

## 2020-11-07 LAB — HEMOGLOBIN AND HEMATOCRIT, BLOOD
HCT: 28.5 % — ABNORMAL LOW (ref 36.0–46.0)
Hemoglobin: 8.3 g/dL — ABNORMAL LOW (ref 12.0–15.0)

## 2020-11-07 LAB — TSH: TSH: 11.895 u[IU]/mL — ABNORMAL HIGH (ref 0.350–4.500)

## 2020-11-07 LAB — T4, FREE: Free T4: 0.99 ng/dL (ref 0.61–1.12)

## 2020-11-07 LAB — MAGNESIUM: Magnesium: 2.1 mg/dL (ref 1.7–2.4)

## 2020-11-07 MED ORDER — METOPROLOL TARTRATE 25 MG PO TABS
12.5000 mg | ORAL_TABLET | Freq: Two times a day (BID) | ORAL | Status: DC
  Administered 2020-11-07 – 2020-11-28 (×38): 12.5 mg via ORAL
  Filled 2020-11-07 (×39): qty 1

## 2020-11-07 NOTE — Progress Notes (Signed)
PT Note  Patient Details Name: Carla Little MRN: [id number] DOB: 1990/09/15   [name] ( company who placed the L knee brace) to come look at brace again to see if they have any ideas to help improve the fit. It is sliding down her leg due to shape and size, sliding on the skin and the lower straps cannot go tight enough. This brace is important for her to create needed stability for her knee.   They should be coming either this evening or tomorrow.    [name] 11/07/2020, 4:19 PM [name], PT, MPT Acute Rehabilitation Services Office: [phone] Pager: [phone] 11/07/2020

## 2020-11-07 NOTE — Progress Notes (Signed)
Occupational Therapy Treatment Patient Details Name: Carla Little MRN: [id number] DOB: 09-Feb-1991 Today's Date: 11/07/2020    History of present illness Carla Little is an 30 y.o.  who is morbidly obese, history of depression and got up to go the bathroom and felt a pop in her knee and had sudden instability and was unable to get up, EMS transported to the ER and found to have a knee dislocation which was reduced with fractures of the proximal fibula ligamentous avulsion laterally as well as medially off the medial femoral condyle and MRI showed complete ACL tear, PCL tear and MCL strain and has a large effusion and is going to be in knee immobilizer.  Vascular ultrasounds done and nondiagnostic. As of 8/13 she was placed in a L Bledsoe brace with WBAT orders and no flexion of knee.   OT comments  Patient was educated on using leg lifter, reacher and toileting wand. Patient verbalized and demonstrated understanding. Patient to look for 3 in1 bariatric commode after discussion with therapist. Patient would continue to benefit from skilled OT services at this time while admitted to address noted deficits in order to improve overall safety and independence in ADLs.    Follow Up Recommendations  Other (comment);SNF (insurance impacting d/c decisions at this time.)    Equipment Recommendations  Other (comment) (bariatric 3 in 1)    Recommendations for Other Services      Precautions / Restrictions Precautions Precautions: Fall;Other (comment) Precaution Comments: NO FLEXION LEFT KNEE, BLEDSOE BRACE AT ALL TIMES Required Braces or Orthoses: Other Brace Restrictions Weight Bearing Restrictions: No Other Position/Activity Restrictions: PT orders state attempt WBAT with L Bledsoe brace with no flexion of L knee       Mobility Bed Mobility               General bed mobility comments: patient was seated in recliner.    Transfers                      Balance                                            ADL either performed or assessed with clinical judgement   ADL Overall ADL's : Needs assistance/impaired                                       General ADL Comments: patient's HR was noted to be 126 bpm at rest. patient  was verbally educated on using leg lifter, toileting wand and reacher. patient was able to demonstrate understanding on using toileting wand, leg lifter on RLE and reacher on this date. patient trialed reaching with toileting wand from seated recliner position posterioly with good contact made. scheduled was created to trial during toileting tasks during next session. patient vocalized stressors in hospital setting. patients thoughs and concerns were heard and validated. patient was educated on optimizing time in hospital to maximize therapy. patient verbalized understanding. patient was educated on staff members available to her for venting if needed. patient verbalized understanding.     Vision       Perception     Praxis      Cognition Arousal/Alertness: Awake/alert Behavior During Therapy: WFL for tasks assessed/performed Overall Cognitive Status: Within Functional Limits  for tasks assessed                                          Exercises     Shoulder Instructions       General Comments      Pertinent Vitals/ Pain       Pain Assessment: Faces Faces Pain Scale: Hurts little more Pain Location: L knee Pain Descriptors / Indicators: Grimacing;Discomfort Pain Intervention(s): Monitored during session  Home Living                                          Prior Functioning/Environment              Frequency  Min 2X/week        Progress Toward Goals  OT Goals(current goals can now be found in the care plan section)  Progress towards OT goals: Progressing toward goals     Plan Discharge plan remains appropriate    Co-evaluation                  AM-PAC OT "6 Clicks" Daily Activity     Outcome Measure   Help from another person eating meals?: None Help from another person taking care of personal grooming?: A Little Help from another person toileting, which includes using toliet, bedpan, or urinal?: Total Help from another person bathing (including washing, rinsing, drying)?: A Lot Help from another person to put on and taking off regular upper body clothing?: A Little Help from another person to put on and taking off regular lower body clothing?: Total 6 Click Score: 14    End of Session Equipment Utilized During Treatment: Other (comment) (left blesoe brace)  OT Visit Diagnosis: Other abnormalities of gait and mobility (R26.89);History of falling (Z91.81);Pain Pain - Right/Left: Left Pain - part of body: Knee   Activity Tolerance Patient tolerated treatment well   Patient Left in chair;with call bell/phone within reach   Nurse Communication Other (comment) (nurse cleared patient to participate in session)        Time: 6803-2122 OT Time Calculation (min): 38 min  Charges: OT General Charges $OT Visit: 1 Visit OT Treatments $Self Care/Home Management : 38-52 mins  Carla Little OTR/L, MS Acute Rehabilitation Department Office# [phone] Pager# [phone]    Carla Little 11/07/2020, 3:47 PM

## 2020-11-07 NOTE — Progress Notes (Signed)
Patient ID: Carla Little, female   DOB: February 23, 1991, 30 y.o.   MRN: [id number] The patient's left knee is clinically and radiographically still located.  X-rays of her left knee yesterday confirm this.  She is in a hinged knee brace.  The plan is to let this knee get a stiff as possible over the next several weeks to few months.  At this point, mobility is important in terms of her being able to transition to home with home health therapy.  Thus far, acute surgery is not indicated for the left knee.  I explained this to her in detail as well.

## 2020-11-07 NOTE — Progress Notes (Addendum)
Physical Therapy Treatment Patient Details Name: Carla Little MRN: [id number] DOB: [birth year] Today's Date: 11/07/2020    History of Present Illness Carla Little is an 30 y.o.  who is morbidly obese, history of depression and got up to go the bathroom and felt a pop in her knee and had sudden instability and was unable to get up, EMS transported to the ER and found to have a knee dislocation which was reduced with fractures of the proximal fibula ligamentous avulsion laterally as well as medially off the medial femoral condyle and MRI showed complete ACL tear, PCL tear and MCL strain and has a large effusion and is going to be in knee immobilizer.  Vascular ultrasounds done and nondiagnostic. As of 8/13 she was placed in a L Bledsoe brace with WBAT orders and no flexion of knee.    PT Comments    Pt progressing with gait today, able to amb ~7' with min/guard however fatigues rapidly requiring chair to be brought to her for seated rest. Pt is aware she has no benefits for f/u PT and will need to maximize mobility as much as possible in acute setting. Have reviewed the importance of Bledsoe brace and placement with bedside RN as well as charge RN since pt with c/o of it not being donned correctly and staff getting her up with brace loose (witnessed by PT this am as well). Continue PT POC.   Follow Up Recommendations  Other (comment) (has no ins= no f/u PT)     Equipment Recommendations  Rolling walker with 5" wheels (wide, ?short)    Recommendations for Other Services       Precautions / Restrictions Precautions Precautions: Fall;Other (comment) Precaution Comments: NO FLEXION LEFT KNEE, BLEDSOE BRACE AT ALL TIMES Required Braces or Orthoses: Other Brace (bledsoe) on L LE  Knee Immobilizer - Left: On at all times Restrictions Weight Bearing Restrictions: No LLE Weight Bearing: Weight bearing as tolerated (per PT order) Other Position/Activity Restrictions: PT orders state  attempt WBAT with L Bledsoe brace with no flexion of L knee    Mobility  Bed Mobility Overal bed mobility: Needs Assistance       Supine to sit: Min guard;HOB elevated     General bed mobility comments: pt getting up with NT with Bledsoe in place however straps loos and brace slipping to ankle    Transfers Overall transfer level: Needs assistance Equipment used: Rolling walker (2 wheeled) Transfers: Sit to/from Stand Sit to Stand: Min guard;+2 safety/equipment         General transfer comment: for safety, cues for hand placement and LLE position, no fully compliant with hand placement  Ambulation/Gait Ambulation/Gait assistance: Min guard;Min assist Gait Distance (Feet): 7 Feet Assistive device: Rolling walker (2 wheeled) Gait Pattern/deviations: Step-to pattern     General Gait Details: cues for sequence, RW position and useof UEs (brace getting caught on wide RW at times). rapide fatigue and chair to pt d/t inability to continue   Stairs             Wheelchair Mobility    Modified Rankin (Stroke Patients Only)       Balance Overall balance assessment: Needs assistance Sitting-balance support: Bilateral upper extremity supported;Feet unsupported Sitting balance-Leahy Scale: Good     Standing balance support: Single extremity supported;Bilateral upper extremity supported Standing balance-Leahy Scale: Fair  Cognition Arousal/Alertness: Awake/alert Behavior During Therapy: WFL for tasks assessed/performed Overall Cognitive Status: Within Functional Limits for tasks assessed                                        Exercises      General Comments        Pertinent Vitals/Pain Pain Assessment: Faces Faces Pain Scale: Hurts little more Pain Location: L knee Pain Descriptors / Indicators: Grimacing;Discomfort Pain Intervention(s): Limited activity within patient's tolerance;Monitored during  session;Premedicated before session    Home Living                      Prior Function            PT Goals (current goals can now be found in the care plan section) Acute Rehab PT Goals Patient Stated Goal: do what I have to do PT Goal Formulation: With patient/family Time For Goal Achievement: 11/16/20 Potential to Achieve Goals: Fair Progress towards PT goals: Progressing toward goals    Frequency    Min 2X/week      PT Plan Current plan remains appropriate;Other (comment) (brace positioning)    Co-evaluation              AM-PAC PT "6 Clicks" Mobility   Outcome Measure  Help needed turning from your back to your side while in a flat bed without using bedrails?: A Little Help needed moving from lying on your back to sitting on the side of a flat bed without using bedrails?: A Little Help needed moving to and from a bed to a chair (including a wheelchair)?: A Little Help needed standing up from a chair using your arms (e.g., wheelchair or bedside chair)?: A Little Help needed to walk in hospital room?: A Little Help needed climbing 3-5 steps with a railing? : Total 6 Click Score: 16    End of Session Equipment Utilized During Treatment: Other (comment) (bledsoe brace LLE) Activity Tolerance: Patient limited by fatigue Patient left: in chair;with call bell/phone within reach Nurse Communication: Mobility status PT Visit Diagnosis: Unsteadiness on feet (R26.81);History of falling (Z91.81);Pain Pain - Right/Left: Left Pain - part of body: Knee     Time: 1031-1057 PT Time Calculation (min) (ACUTE ONLY): 26 min  Charges:  $Gait Training: 8-22 mins $Therapeutic Activity: 8-22 mins                     Delice Bison, PT  Acute Rehab Dept ([facility]) [phone] Pager [phone]  11/07/2020    [facility] 11/07/2020, 12:02 PM

## 2020-11-07 NOTE — Plan of Care (Signed)

## 2020-11-07 NOTE — Progress Notes (Signed)
PROGRESS NOTE  Carla Little PXT:[id number] DOB: [birth year]   PCP: Patient, No Pcp Per (Inactive)  Patient is from: Home  DOA: 10/31/2020 LOS: 6  Chief complaints:  Chief Complaint  Patient presents with   Leg Pain     Brief Narrative / Interim history: 30yo F with a history of morbid obesity and anxiety/depression who felt a pop in her knee when ambulating and experienced sudden instability with resultant mechanical fall.  She summoned EMS when she was unable to get up and in the ER she was found to have dislocated her knee.  This was able to be reduced in the ER but an MRI revealed a complete ACL tear, PCL tear, and MCL strain as well as a large effusion.  Vascular studies were done and fortunately no significant vascular injury was found.  Orthopedic surgery managing with Brace.  No plan for surgery.  She seems to be maximum assist for mobility.   Subjective: Seen and examined earlier this morning.  Complains pain in the left knee after she got up to bedside commode.  Denies numbness or tingling.  No other complaints.  Objective: Vitals:   11/06/20 0634 11/06/20 1038 11/06/20 1435 11/06/20 2020  BP:   130/77 107/72  Pulse:   (!) 117 (!) 120  Resp:   18 18  Temp:   98.5 F (36.9 C) 98.4 F (36.9 C)  TempSrc:   Oral Oral  SpO2: 92% 96% 95% 99%  Weight:      Height:        Intake/Output Summary (Last 24 hours) at 11/07/2020 1216 Last data filed at 11/07/2020 0300 Gross per 24 hour  Intake 240 ml  Output 1100 ml  Net -860 ml   Filed Weights   11/01/20 0026  Weight: (!) 181.4 kg    Examination:  GENERAL: No apparent distress.  Nontoxic. HEENT: MMM.  Vision and hearing grossly intact.  NECK: Supple.   RESP:  No IWOB.  Fair aeration bilaterally. CVS:  RRR. Heart sounds normal.  ABD/GI/GU: BS+. Abd soft, NTND.  MSK/EXT:  Moves extremities.  Brace over LLE.  Neurovascular intact. SKIN: no apparent skin lesion or wound NEURO: Awake and alert. Oriented  appropriately.  No apparent focal neuro deficit. PSYCH: Calm. Normal affect.   Procedures:  None  Microbiology summarized: COVID-19 and influenza PCR nonreactive.  Assessment & Plan: Left knee dislocation/ACL and PCL tears/avulsion fracture involving the head of the fibula/patellar dislocation/MCL sprain -Orthopedic surgery managing.   -Bledsoe hinged knee brace w/ serial exams to assure the knee remains in place  -Repeat x-ray stable -Ortho recommended mobility but no plan for acute surgery -Pain control with bowel regimen    Persistent tachycardia - elevated d-dimer-likely from pain and anxiety.  CTA chest negative for PE.  TSH slightly elevated at 4.6. -Repeat TSH -Check free T4 -Low-dose metoprolol   Acute urinary retention-resolved. -Continue Urecholine   Acute hypoxic respiratory failure: Concern about undiagnosed OHS/OSA -May need supplemental oxygen at night and when she for sleep -Needs outpatient sleep study.  Iron deficiency anemia: H&H stable.  Ferritin low at 11.  Iron sat 6%.  TIBC 388. Recent Labs    11/01/20 0150 11/02/20 0605 11/02/20 1433 11/02/20 2150 11/03/20 0340 11/04/20 0408 11/05/20 0411 11/06/20 0331 11/07/20 0313  HGB 8.9* 8.6* 8.2* 8.1* 7.9* 8.2* 8.5* 8.4* 8.3*  -Received IV iron on 8/12. -Monitor H&H -P.o. iron supplementation  Hypokalemia: Resolved.   Hyperglycemia: Likely stress-induced.  A1c 5%.  Resolved.  Hyponatremia: Resolved.  Anxiety and depression: Stable. -Continue home meds   Thrombocytosis: Likely reactive.  Improved. -Continue monitoring  Morbid obesity Body mass index is 68.66 kg/m.         DVT prophylaxis:  SCDs Start: 11/01/20 0953  Code Status: Full code Family Communication: Patient and/or RN. Available if any question.  Level of care: Med-Surg Status is: Inpatient  Remains inpatient appropriate because:Ongoing active pain requiring inpatient pain management, Unsafe d/c plan, and Inpatient level  of care appropriate due to severity of illness  Dispo: The patient is from: Home              Anticipated d/c is to: Home but the patient is maximum assist and can't be home alone              Patient currently is not medically stable to d/c.   Difficult to place patient Yes        Consultants:  Orthopedic surgery   Sch Meds:  Scheduled Meds:  bethanechol  10 mg Oral TID   Chlorhexidine Gluconate Cloth  6 each Topical Daily   cholecalciferol  2,000 Units Oral Daily   enoxaparin (LOVENOX) injection  0.5 mg/kg Subcutaneous Q24H   ferrous sulfate  325 mg Oral Q breakfast   senna-docusate  1 tablet Oral BID   Continuous Infusions:  methocarbamol (ROBAXIN) IV     PRN Meds:.acetaminophen, ALPRAZolam, diphenhydrAMINE, HYDROmorphone (DILAUDID) injection, menthol-cetylpyridinium, methocarbamol **OR** methocarbamol (ROBAXIN) IV, morphine injection, ondansetron **OR** ondansetron (ZOFRAN) IV, oxyCODONE, oxyCODONE, polyethylene glycol  Antimicrobials: Anti-infectives (From admission, onward)    None        I have personally reviewed the following labs and images: CBC: Recent Labs  Lab 11/02/20 2150 11/03/20 0340 11/04/20 0408 11/05/20 0411 11/06/20 0331 11/07/20 0313  WBC 9.2 8.8 5.8 9.9 7.8  --   HGB 8.1* 7.9* 8.2* 8.5* 8.4* 8.3*  HCT 27.9* 26.9* 27.8* 29.5* 28.5* 28.5*  MCV 81.3 80.8 82.5 82.4 82.6  --   PLT 495* 462* 441* 507* 477*  --    BMP &GFR Recent Labs  Lab 11/02/20 0605 11/03/20 0340 11/04/20 0408 11/05/20 0411 11/06/20 0331 11/07/20 0313  NA 139 136 138 134* 136 138  K 3.5 3.3* 3.4* 3.9 3.2* 3.6  CL 104 102 101 99 97* 100  CO2 25 28 28 28 31 30   GLUCOSE 119* 107* 92 94 114* 105*  BUN 7 6 <5* <5* 6 <5*  CREATININE 0.63 0.51 0.51 0.48 0.60 0.65  CALCIUM 8.8* 8.2* 8.5* 8.5* 8.6* 8.9  MG 1.9  --   --   --  2.0 2.1  PHOS  --   --   --   --  2.3* 2.7   Estimated Creatinine Clearance: 169.5 mL/min (by C-G formula based on SCr of 0.65 mg/dL). Liver  & Pancreas: Recent Labs  Lab 11/02/20 1035 11/04/20 0408 11/06/20 0331 11/07/20 0313  AST 41 44*  --   --   ALT 31 30  --   --   ALKPHOS 61 60  --   --   BILITOT 1.2 1.0  --   --   PROT 7.3 7.3  --   --   ALBUMIN 3.1* 3.0* 3.0* 3.1*   No results for input(s): LIPASE, AMYLASE in the last 168 hours. No results for input(s): AMMONIA in the last 168 hours. Diabetic: No results for input(s): HGBA1C in the last 72 hours. No results for input(s): GLUCAP in the last 168 hours. Cardiac Enzymes: No results for input(s):  CKTOTAL, CKMB, CKMBINDEX, TROPONINI in the last 168 hours. No results for input(s): PROBNP in the last 8760 hours. Coagulation Profile: Recent Labs  Lab 11/02/20 1035  INR 1.2   Thyroid Function Tests: No results for input(s): TSH, T4TOTAL, FREET4, T3FREE, THYROIDAB in the last 72 hours. Lipid Profile: No results for input(s): CHOL, HDL, LDLCALC, TRIG, CHOLHDL, LDLDIRECT in the last 72 hours. Anemia Panel: No results for input(s): VITAMINB12, FOLATE, FERRITIN, TIBC, IRON, RETICCTPCT in the last 72 hours. Urine analysis:    Component Value Date/Time   COLORURINE YELLOW 01/10/2018 2032   APPEARANCEUR CLOUDY (A) 01/10/2018 2032   LABSPEC 1.033 (H) 01/10/2018 2032   PHURINE 5.0 01/10/2018 2032   GLUCOSEU NEGATIVE 01/10/2018 2032   HGBUR NEGATIVE 01/10/2018 2032   BILIRUBINUR NEGATIVE 01/10/2018 2032   KETONESUR 5 (A) 01/10/2018 2032   PROTEINUR 30 (A) 01/10/2018 2032   UROBILINOGEN 1.0 10/27/2013 2116   NITRITE NEGATIVE 01/10/2018 2032   LEUKOCYTESUR TRACE (A) 01/10/2018 2032   Sepsis Labs: Invalid input(s): PROCALCITONIN, LACTICIDVEN  Microbiology: Recent Results (from the past 240 hour(s))  Resp Panel by RT-PCR (Flu A&B, Covid) Nasopharyngeal Swab     Status: None   Collection Time: 11/01/20  9:58 AM   Specimen: Nasopharyngeal Swab; Nasopharyngeal(NP) swabs in vial transport medium  Result Value Ref Range Status   SARS Coronavirus 2 by RT PCR NEGATIVE  NEGATIVE Final    Comment: (NOTE) SARS-CoV-2 target nucleic acids are NOT DETECTED.  The SARS-CoV-2 RNA is generally detectable in upper respiratory specimens during the acute phase of infection. The lowest concentration of SARS-CoV-2 viral copies this assay can detect is 138 copies/mL. A negative result does not preclude SARS-Cov-2 infection and should not be used as the sole basis for treatment or other patient management decisions. A negative result may occur with  improper specimen collection/handling, submission of specimen other than nasopharyngeal swab, presence of viral mutation(s) within the areas targeted by this assay, and inadequate number of viral copies(<138 copies/mL). A negative result must be combined with clinical observations, patient history, and epidemiological information. The expected result is Negative.  Fact Sheet for Patients:  BloggerCourse.com  Fact Sheet for Healthcare Providers:  SeriousBroker.it  This test is no t yet approved or cleared by the [place] FDA and  has been authorized for detection and/or diagnosis of SARS-CoV-2 by FDA under an Emergency Use Authorization (EUA). This EUA will remain  in effect (meaning this test can be used) for the duration of the COVID-19 declaration under Section 564(b)(1) of the Act, 21 U.S.C.section 360bbb-3(b)(1), unless the authorization is terminated  or revoked sooner.       Influenza A by PCR NEGATIVE NEGATIVE Final   Influenza B by PCR NEGATIVE NEGATIVE Final    Comment: (NOTE) The Xpert Xpress SARS-CoV-2/FLU/RSV plus assay is intended as an aid in the diagnosis of influenza from Nasopharyngeal swab specimens and should not be used as a sole basis for treatment. Nasal washings and aspirates are unacceptable for Xpert Xpress SARS-CoV-2/FLU/RSV testing.  Fact Sheet for Patients: BloggerCourse.com  Fact Sheet for Healthcare  Providers: SeriousBroker.it  This test is not yet approved or cleared by the [place] FDA and has been authorized for detection and/or diagnosis of SARS-CoV-2 by FDA under an Emergency Use Authorization (EUA). This EUA will remain in effect (meaning this test can be used) for the duration of the COVID-19 declaration under Section 564(b)(1) of the Act, 21 U.S.C. section 360bbb-3(b)(1), unless the authorization is terminated or revoked.  Performed at  [facility], [address] [place], [place] [id number]     Radiology Studies: DG Knee Left Port  Result Date: 11/06/2020 CLINICAL DATA:  Recent knee dislocation, ongoing knee pain. EXAM: PORTABLE LEFT KNEE - 1-2 VIEW COMPARISON:  CT and radiographs of the knee dated 11/01/2020. FINDINGS: Again seen are fractures of the proximal tibia laterally, the fibular head, and the medial distal femur. No evidence of new fracture, dislocation, or joint effusion. No evidence of arthropathy or other focal bone abnormality. Soft tissues are unremarkable. IMPRESSION: No new acute osseous injury since recent fractures of 10/31/2020. Electronically Signed   By: Romona Curlsyler  Litton M.D.   On: 11/06/2020 15:06      Kaye Luoma T. Sanjeev Main [facility]  If 7PM-7AM, please contact night-coverage [email or web address] 11/07/2020, 12:16 PM

## 2020-11-08 MED ORDER — SODIUM CHLORIDE 0.9% FLUSH
10.0000 mL | INTRAVENOUS | Status: DC | PRN
  Administered 2020-11-21: 10 mL

## 2020-11-08 MED ORDER — OXYCODONE HCL 5 MG PO TABS
10.0000 mg | ORAL_TABLET | ORAL | Status: DC | PRN
  Administered 2020-11-08 – 2020-11-28 (×56): 10 mg via ORAL
  Filled 2020-11-08 (×57): qty 2

## 2020-11-08 MED ORDER — OXYCODONE HCL 5 MG PO TABS
5.0000 mg | ORAL_TABLET | ORAL | Status: DC | PRN
  Administered 2020-11-19 – 2020-11-27 (×5): 5 mg via ORAL
  Filled 2020-11-08 (×7): qty 1

## 2020-11-08 NOTE — TOC Progression Note (Signed)
Transition of Care [facility]) - Progression Note    Patient Details  Name: Carla Little MRN: [id number] Date of Birth: [birth year]  [facility] CM/SW Contact  Darleene Cleaver, [place] Phone Number: 11/08/2020, 5:57 PM  Clinical Narrative:     CSW continuing to follow patient's progress still no SNF beds available.   Expected Discharge Plan: Skilled Nursing Facility Barriers to Discharge: Continued Medical Work up, Inadequate or no insurance, SNF Pending bed offer  Expected Discharge Plan and Services Expected Discharge Plan: Skilled Nursing Facility In-house Referral: Clinical Social Work, Chief Executive Officer Acute Care Choice: Skilled Nursing Facility Living arrangements for the past 2 months: Apartment                 DME Arranged: N/A DME Agency: NA                   Social Determinants of Health (SDOH) Interventions    Readmission Risk Interventions No flowsheet data found.

## 2020-11-08 NOTE — Progress Notes (Signed)
Pt resting in bed, stable condition, O2 in place. Encourage pt call when needing to go to the bathroom. Pt states, I sometimes get myself to the bathroom and back to bed." Stressed to pt to call for assistance. Acknowledged understanding however she voiced she may not wait for staff to help if not readily available. Again, reinforced pt safety,and call for assistance. SRP,RN

## 2020-11-08 NOTE — Progress Notes (Signed)
PROGRESS NOTE  Carla Little TTS:[id number] DOB: [birth year]   PCP: Patient, No Pcp Per (Inactive)  Patient is from: Home  DOA: 10/31/2020 LOS: 7  Chief complaints:  Chief Complaint  Patient presents with   Leg Pain     Brief Narrative / Interim history: 30yo F with a history of morbid obesity and anxiety/depression who felt a pop in her knee when ambulating and experienced sudden instability with resultant mechanical fall.  She summoned EMS when she was unable to get up and in the ER she was found to have dislocated her knee.  This was able to be reduced in the ER but an MRI revealed a complete ACL tear, PCL tear, and MCL strain as well as a large effusion.  Vascular studies were done and fortunately no significant vascular injury was found.  Orthopedic surgery managing with Brace.  No plan for surgery.  She seems to be maximum assist for mobility.  Lives alone.  Therapy recommended SNF.  Subjective: Seen and examined earlier this morning.  No major events overnight of this morning.  Reports significant pain in left knee after she got to bedside chair.  She rates her pain 9/10 although she does not seem to be in that much distress.  Denies numbness or tingling.  Has not had bowel movement yet.  Likes to continue senna and MiraLAX.  He does not want enema yet.  Objective: Vitals:   11/08/20 0449 11/08/20 0920 11/08/20 0929 11/08/20 1355  BP: 101/68 129/90  116/67  Pulse: (!) 115 (!) 109  88  Resp: 18 16  18   Temp: 98.6 F (37 C)   98.5 F (36.9 C)  TempSrc: Oral   Oral  SpO2: 99% (!) 78% 98% 100%  Weight:      Height:        Intake/Output Summary (Last 24 hours) at 11/08/2020 1559 Last data filed at 11/08/2020 0900 Gross per 24 hour  Intake 960 ml  Output --  Net 960 ml   Filed Weights   11/01/20 0026  Weight: (!) 181.4 kg    Examination:   GENERAL: No apparent distress.  Nontoxic. HEENT: MMM.  Vision and hearing grossly intact.  NECK: Supple.  No apparent JVD.   RESP: On RA.  No IWOB.  Fair aeration bilaterally. CVS:  RRR. Heart sounds normal.  ABD/GI/GU: BS+. Abd soft, NTND.  MSK/EXT:  Moves extremities.  Brace over LLE.  Neurovascular intact. SKIN: no apparent skin lesion or wound NEURO: Awake and alert. Oriented appropriately.  No apparent focal neuro deficit. PSYCH: Calm. Normal affect.   Procedures:  None  Microbiology summarized: COVID-19 and influenza PCR nonreactive.  Assessment & Plan: Left knee dislocation/ACL and PCL tears/avulsion fracture involving the head of the fibula/patellar dislocation/MCL sprain -Orthopedic surgery managing.   -Bledsoe hinged knee brace w/ serial exams to assure the knee remains in place  -Repeat x-ray on 6/14 stable -Ortho recommended mobility but no plan for acute surgery -Pain control with oral pain meds with bowel regimen.  Discontinued IV Dilaudid.  Persistent tachycardia - elevated d-dimer-likely from pain and anxiety.  CTA chest negative for PE.  TSH elevated but free T4 within normal. -Continue low-dose metoprolol   Acute urinary retention-resolved. -Continue Urecholine   Acute hypoxic respiratory failure: Concern about undiagnosed OHS/OSA -May need supplemental oxygen at night and when she for sleep -Needs outpatient sleep study.  Iron deficiency anemia: H&H stable.  Ferritin low at 11.  Iron sat 6%.  TIBC 388. Recent Labs  11/01/20 0150 11/02/20 0605 11/02/20 1433 11/02/20 2150 11/03/20 0340 11/04/20 0408 11/05/20 0411 11/06/20 0331 11/07/20 0313  HGB 8.9* 8.6* 8.2* 8.1* 7.9* 8.2* 8.5* 8.4* 8.3*  -Received IV iron on 8/12. -Monitor H&H -P.o. iron supplementation  Hypokalemia: Resolved.   Hyperglycemia: Likely stress-induced.  A1c 5%.  Resolved.  Hyponatremia: Resolved.   Anxiety and depression: Stable. -Continue home meds   Thrombocytosis: Likely reactive.  Improved. -Continue monitoring  Euthyroid sick syndrome -Check TSH in 4 to 6 weeks outpatient.  Morbid  obesity Body mass index is 68.66 kg/m.         DVT prophylaxis:  SCDs Start: 11/01/20 0953  Code Status: Full code Family Communication: Patient and/or RN. Available if any question.  Level of care: Med-Surg Status is: Inpatient  Remains inpatient appropriate because:Unsafe d/c plan  Dispo: The patient is from: Home              Anticipated d/c is to: Home but the patient is maximum assist and can't be home alone              Patient currently is medically stable to d/c.   Difficult to place patient Yes        Consultants:  Orthopedic surgery   Sch Meds:  Scheduled Meds:  Chlorhexidine Gluconate Cloth  6 each Topical Daily   cholecalciferol  2,000 Units Oral Daily   enoxaparin (LOVENOX) injection  0.5 mg/kg Subcutaneous Q24H   ferrous sulfate  325 mg Oral Q breakfast   metoprolol tartrate  12.5 mg Oral BID   senna-docusate  1 tablet Oral BID   Continuous Infusions:   PRN Meds:.acetaminophen, ALPRAZolam, diphenhydrAMINE, menthol-cetylpyridinium, methocarbamol **OR** [DISCONTINUED] methocarbamol (ROBAXIN) IV, ondansetron **OR** ondansetron (ZOFRAN) IV, oxyCODONE, oxyCODONE, polyethylene glycol, sodium chloride flush  Antimicrobials: Anti-infectives (From admission, onward)    None        I have personally reviewed the following labs and images: CBC: Recent Labs  Lab 11/02/20 2150 11/03/20 0340 11/04/20 0408 11/05/20 0411 11/06/20 0331 11/07/20 0313  WBC 9.2 8.8 5.8 9.9 7.8  --   HGB 8.1* 7.9* 8.2* 8.5* 8.4* 8.3*  HCT 27.9* 26.9* 27.8* 29.5* 28.5* 28.5*  MCV 81.3 80.8 82.5 82.4 82.6  --   PLT 495* 462* 441* 507* 477*  --    BMP &GFR Recent Labs  Lab 11/02/20 0605 11/03/20 0340 11/04/20 0408 11/05/20 0411 11/06/20 0331 11/07/20 0313  NA 139 136 138 134* 136 138  K 3.5 3.3* 3.4* 3.9 3.2* 3.6  CL 104 102 101 99 97* 100  CO2 25 28 28 28 31 30   GLUCOSE 119* 107* 92 94 114* 105*  BUN 7 6 <5* <5* 6 <5*  CREATININE 0.63 0.51 0.51 0.48 0.60  0.65  CALCIUM 8.8* 8.2* 8.5* 8.5* 8.6* 8.9  MG 1.9  --   --   --  2.0 2.1  PHOS  --   --   --   --  2.3* 2.7   Estimated Creatinine Clearance: 169.5 mL/min (by C-G formula based on SCr of 0.65 mg/dL). Liver & Pancreas: Recent Labs  Lab 11/02/20 1035 11/04/20 0408 11/06/20 0331 11/07/20 0313  AST 41 44*  --   --   ALT 31 30  --   --   ALKPHOS 61 60  --   --   BILITOT 1.2 1.0  --   --   PROT 7.3 7.3  --   --   ALBUMIN 3.1* 3.0* 3.0* 3.1*   No results for  input(s): LIPASE, AMYLASE in the last 168 hours. No results for input(s): AMMONIA in the last 168 hours. Diabetic: No results for input(s): HGBA1C in the last 72 hours. No results for input(s): GLUCAP in the last 168 hours. Cardiac Enzymes: No results for input(s): CKTOTAL, CKMB, CKMBINDEX, TROPONINI in the last 168 hours. No results for input(s): PROBNP in the last 8760 hours. Coagulation Profile: Recent Labs  Lab 11/02/20 1035  INR 1.2   Thyroid Function Tests: Recent Labs    11/07/20 1247  TSH 11.895*  FREET4 0.99   Lipid Profile: No results for input(s): CHOL, HDL, LDLCALC, TRIG, CHOLHDL, LDLDIRECT in the last 72 hours. Anemia Panel: No results for input(s): VITAMINB12, FOLATE, FERRITIN, TIBC, IRON, RETICCTPCT in the last 72 hours. Urine analysis:    Component Value Date/Time   COLORURINE YELLOW 01/10/2018 2032   APPEARANCEUR CLOUDY (A) 01/10/2018 2032   LABSPEC 1.033 (H) 01/10/2018 2032   PHURINE 5.0 01/10/2018 2032   GLUCOSEU NEGATIVE 01/10/2018 2032   HGBUR NEGATIVE 01/10/2018 2032   BILIRUBINUR NEGATIVE 01/10/2018 2032   KETONESUR 5 (A) 01/10/2018 2032   PROTEINUR 30 (A) 01/10/2018 2032   UROBILINOGEN 1.0 10/27/2013 2116   NITRITE NEGATIVE 01/10/2018 2032   LEUKOCYTESUR TRACE (A) 01/10/2018 2032   Sepsis Labs: Invalid input(s): PROCALCITONIN, LACTICIDVEN  Microbiology: Recent Results (from the past 240 hour(s))  Resp Panel by RT-PCR (Flu A&B, Covid) Nasopharyngeal Swab     Status: None    Collection Time: 11/01/20  9:58 AM   Specimen: Nasopharyngeal Swab; Nasopharyngeal(NP) swabs in vial transport medium  Result Value Ref Range Status   SARS Coronavirus 2 by RT PCR NEGATIVE NEGATIVE Final    Comment: (NOTE) SARS-CoV-2 target nucleic acids are NOT DETECTED.  The SARS-CoV-2 RNA is generally detectable in upper respiratory specimens during the acute phase of infection. The lowest concentration of SARS-CoV-2 viral copies this assay can detect is 138 copies/mL. A negative result does not preclude SARS-Cov-2 infection and should not be used as the sole basis for treatment or other patient management decisions. A negative result may occur with  improper specimen collection/handling, submission of specimen other than nasopharyngeal swab, presence of viral mutation(s) within the areas targeted by this assay, and inadequate number of viral copies(<138 copies/mL). A negative result must be combined with clinical observations, patient history, and epidemiological information. The expected result is Negative.  Fact Sheet for Patients:  BloggerCourse.com  Fact Sheet for Healthcare Providers:  SeriousBroker.it  This test is no t yet approved or cleared by the [place] FDA and  has been authorized for detection and/or diagnosis of SARS-CoV-2 by FDA under an Emergency Use Authorization (EUA). This EUA will remain  in effect (meaning this test can be used) for the duration of the COVID-19 declaration under Section 564(b)(1) of the Act, 21 U.S.C.section 360bbb-3(b)(1), unless the authorization is terminated  or revoked sooner.       Influenza A by PCR NEGATIVE NEGATIVE Final   Influenza B by PCR NEGATIVE NEGATIVE Final    Comment: (NOTE) The Xpert Xpress SARS-CoV-2/FLU/RSV plus assay is intended as an aid in the diagnosis of influenza from Nasopharyngeal swab specimens and should not be used as a sole basis for treatment.  Nasal washings and aspirates are unacceptable for Xpert Xpress SARS-CoV-2/FLU/RSV testing.  Fact Sheet for Patients: BloggerCourse.com  Fact Sheet for Healthcare Providers: SeriousBroker.it  This test is not yet approved or cleared by the [place] FDA and has been authorized for detection and/or diagnosis of SARS-CoV-2 by  FDA under an Emergency Use Authorization (EUA). This EUA will remain in effect (meaning this test can be used) for the duration of the COVID-19 declaration under Section 564(b)(1) of the Act, 21 U.S.C. section 360bbb-3(b)(1), unless the authorization is terminated or revoked.  Performed at [facility], [address] [place], [place] [address]     Radiology Studies: No results found.    Lendora Keys T. Chijioke Lasser [facility]  If 7PM-7AM, please contact night-coverage [email or web address] 11/08/2020, 3:59 PM

## 2020-11-08 NOTE — Plan of Care (Signed)
  Problem: Clinical Measurements: Goal: Will remain free from infection Outcome: Progressing Goal: Diagnostic test results will improve Outcome: Progressing   Problem: Activity: Goal: Risk for activity intolerance will decrease Outcome: Progressing   Problem: Coping: Goal: Level of anxiety will decrease Outcome: Progressing   Problem: Elimination: Goal: Will not experience complications related to bowel motility Outcome: Progressing Goal: Will not experience complications related to urinary retention Outcome: Progressing

## 2020-11-08 NOTE — Progress Notes (Signed)
PT c/o of shortness of breathe, pt resting in chair, states " I feel dizzy as if I am going to pass out. Pt appear pale and diminished in chest, VS noted and charted. MD updated will cont to monitor, MD will adjust plan of care. SRP, RN    11/08/20 0920 11/08/20 0929  Vitals  BP 129/90  --   MAP (mmHg) 101  --   BP Location Right Arm  --   BP Method Automatic  --   Patient Position (if appropriate) Sitting  --   Pulse Rate (!) 109  --   Pulse Rate Source Dinamap  --   Resp 16  --   Level of Consciousness  Level of Consciousness Alert  --   Oxygen Therapy  SpO2 (!) 78 % 98 %  O2 Device Room Air Nasal Cannula  O2 Flow Rate (L/min)  --  2 L/min

## 2020-11-08 NOTE — Progress Notes (Signed)
Occupational Therapy Treatment Patient Details Name: Carla Little MRN: [id number] DOB: [birth year] Today's Date: 11/08/2020    History of present illness Carla Little is an 30 y.o.  who is morbidly obese, history of depression and got up to go the bathroom and felt a pop in her knee and had sudden instability and was unable to get up, EMS transported to the ER and found to have a knee dislocation which was reduced with fractures of the proximal fibula ligamentous avulsion laterally as well as medially off the medial femoral condyle and MRI showed complete ACL tear, PCL tear and MCL strain and has a large effusion and is going to be in knee immobilizer.  Vascular ultrasounds done and nondiagnostic. As of 8/13 she was placed in a L Bledsoe brace with WBAT orders and no flexion of knee.   OT comments  Patient was educated on walker tray for home for being able to participate in meals. Patient's brace was adjusted at ankle to maintain skin integrity. Patient was educated on areas to monitor on brace with staff. Patient verbalized understanding. Patient was noted to have episode of emesis when nurse entered room to provide medications. Patient was left with nurse at this time. Will attempt to see later if schedule allows.   Follow Up Recommendations  SNF    Equipment Recommendations  Other (comment) (bariatric 3 in 1 and walker tray)    Recommendations for Other Services      Precautions / Restrictions Precautions Precautions: Fall Precaution Comments: NO FLEXION LEFT KNEE, BLEDSOE BRACE AT ALL TIMES Required Braces or Orthoses: Other Brace Restrictions Weight Bearing Restrictions: No LLE Weight Bearing: Touchdown weight bearing Other Position/Activity Restrictions: PT orders state attempt WBAT with L Bledsoe brace with no flexion of L knee       Mobility Bed Mobility               General bed mobility comments: patient was seated in recliner.    Transfers                       Balance                                           ADL either performed or assessed with clinical judgement   ADL                                         General ADL Comments: patient was already seated in recliner this AM reporting that NT assisted her with ADLs earlier on this date. patient reported using the toileting wand earlier this AM that it worked well. patient was educated on walker tray for home to incrase independence in ADLs. patient reporetd pain of 9/10 with adjustment of Bledsoe brace to align with knee support. nurse consulted to provide pain medication. upon nurse entering room patient started to have an episode of emesis. patient was left in nurse care at this time.     Vision       Perception     Praxis      Cognition Arousal/Alertness: Awake/alert Behavior During Therapy: WFL for tasks assessed/performed Overall Cognitive Status: Within Functional Limits for tasks assessed  Exercises     Shoulder Instructions       General Comments      Pertinent Vitals/ Pain       Pain Assessment: 0-10 Pain Score: 9  Pain Location: L knee Pain Descriptors / Indicators: Grimacing;Discomfort Pain Intervention(s): Monitored during session  Home Living                                          Prior Functioning/Environment              Frequency  Min 2X/week        Progress Toward Goals  OT Goals(current goals can now be found in the care plan section)  Progress towards OT goals: OT to reassess next treatment     Plan Discharge plan remains appropriate    Co-evaluation                 AM-PAC OT "6 Clicks" Daily Activity     Outcome Measure   Help from another person eating meals?: None Help from another person taking care of personal grooming?: A Little Help from another person toileting, which includes using  toliet, bedpan, or urinal?: Total Help from another person bathing (including washing, rinsing, drying)?: A Lot Help from another person to put on and taking off regular upper body clothing?: A Little Help from another person to put on and taking off regular lower body clothing?: Total 6 Click Score: 14    End of Session    OT Visit Diagnosis: Other abnormalities of gait and mobility (R26.89);History of falling (Z91.81);Pain Pain - Right/Left: Left Pain - part of body: Knee   Activity Tolerance Other (comment) (onset of emesis)   Patient Left in chair;with call bell/phone within reach;Other (comment) (with nurse in room)   Nurse Communication Patient requests pain meds        Time: 3976-7341 OT Time Calculation (min): 22 min  Charges: OT General Charges $OT Visit: 1 Visit OT Treatments $Self Care/Home Management : 8-22 mins  Sharyn Blitz OTR/L, MS Acute Rehabilitation Department Office# [phone] Pager# [phone]    [facility]hle 11/08/2020, 10:15 AM

## 2020-11-09 MED ORDER — ORAL CARE MOUTH RINSE
15.0000 mL | Freq: Two times a day (BID) | OROMUCOSAL | Status: DC
  Administered 2020-11-12 – 2020-11-27 (×23): 15 mL via OROMUCOSAL

## 2020-11-09 NOTE — Progress Notes (Signed)
[facility]  PROGRESS NOTE  Carla Little IRC:[id number] DOB: 1990-09-24 DOA: 10/31/2020 PCP: Patient, No Pcp Per (Inactive)   Brief HPI:   30 year old female with history of morbid obesity, anxiety/depression, who felt a pop in the left knee when ambulating and suddenly experience instability with resultant mechanical fall.  EMS was called and she was found to have dislocated her knee.  This was recently reduced in the ED, MRI revealed a complete ACL tear, PCL tear and MCL strain as well as large effusion.  Orthopedic surgery was consulted and is managing with brace.  No plan for surgery.  Therapy recommended skilled nursing facility.    Subjective   Patient seen and examined, pain is adequately controlled.   Assessment/Plan:     Left knee dislocation -ACL and PCL tear/avulsion fracture involving the head of fibula/patellar dislocation/MCL sprain -Orthopedics consulted -Bledsoe hinged knee brace with serial exams to ensure that knee remains in place -Pain controlled with oral pain medications and bowel regimen  Persistent sinus tachycardia -D-dimer was elevated; CTA chest was negative for PE -Continue low-dose metoprolol 12.5 mg p.o. twice daily   Acute urinary retention -Resolved -Continue Urecholine  Acute hypoxemic respiratory failure -Concern for undiagnosed OHS/OSA -Will need outpatient sleep study  Iron-deficiency anemia -Ferritin low at 11, iron saturation 6% -Received IV iron on 8/12 -Continue p.o. iron supplementation  Hyponatremia -Resolved  Thrombocytosis -Improved -Likely reactive  Euthyroid sick syndrome -TSH 11.895, T4 0.99 -Check TSH in 4 to 6 weeks as outpatient   Scheduled medications:    Chlorhexidine Gluconate Cloth  6 each Topical Daily   cholecalciferol  2,000 Units Oral Daily   enoxaparin (LOVENOX) injection  0.5 mg/kg Subcutaneous Q24H   ferrous sulfate  325 mg Oral Q breakfast   mouth rinse  15 mL Mouth Rinse BID    metoprolol tartrate  12.5 mg Oral BID   senna-docusate  1 tablet Oral BID         Data Reviewed:   CBG:  No results for input(s): GLUCAP in the last 168 hours.  SpO2: 100 % O2 Flow Rate (L/min): 2 L/min    Vitals:   11/08/20 1355 11/08/20 2030 11/09/20 0618 11/09/20 1300  BP: 116/67 114/69 117/71   Pulse: 88 (!) 104 (!) 102   Resp: 18 20 19    Temp: 98.5 F (36.9 C) 98 F (36.7 C) 98.5 F (36.9 C)   TempSrc: Oral Oral Oral   SpO2: 100% 100% 95% 100%  Weight:      Height:         Intake/Output Summary (Last 24 hours) at 11/09/2020 1815 Last data filed at 11/09/2020 11/11/2020 Gross per 24 hour  Intake 360 ml  Output 350 ml  Net 10 ml    08/15 1901 - 08/17 0700 In: 1080 [P.O.:1080] Out: 350 [Urine:350]  Filed Weights   11/01/20 0026  Weight: (!) 181.4 kg    CBC:  Recent Labs  Lab 11/02/20 2150 11/03/20 0340 11/04/20 0408 11/05/20 0411 11/06/20 0331 11/07/20 0313  WBC 9.2 8.8 5.8 9.9 7.8  --   HGB 8.1* 7.9* 8.2* 8.5* 8.4* 8.3*  HCT 27.9* 26.9* 27.8* 29.5* 28.5* 28.5*  PLT 495* 462* 441* 507* 477*  --   MCV 81.3 80.8 82.5 82.4 82.6  --   MCH 23.6* 23.7* 24.3* 23.7* 24.3*  --   MCHC 29.0* 29.4* 29.5* 28.8* 29.5*  --   RDW 21.0* 21.0* 21.2* 21.5* 21.4*  --     Complete metabolic panel:  Recent Labs  Lab 11/03/20 0340 11/03/20 1015 11/04/20 0408 11/05/20 0411 11/06/20 0331 11/07/20 0313 11/07/20 1247  NA 136  --  138 134* 136 138  --   K 3.3*  --  3.4* 3.9 3.2* 3.6  --   CL 102  --  101 99 97* 100  --   CO2 28  --  28 28 31 30   --   GLUCOSE 107*  --  92 94 114* 105*  --   BUN 6  --  <5* <5* 6 <5*  --   CREATININE 0.51  --  0.51 0.48 0.60 0.65  --   CALCIUM 8.2*  --  8.5* 8.5* 8.6* 8.9  --   AST  --   --  44*  --   --   --   --   ALT  --   --  30  --   --   --   --   ALKPHOS  --   --  60  --   --   --   --   BILITOT  --   --  1.0  --   --   --   --   ALBUMIN  --   --  3.0*  --  3.0* 3.1*  --   MG  --   --   --   --  2.0 2.1  --   DDIMER   --  0.84*  --   --   --   --   --   TSH  --   --   --   --   --   --  11.895*    No results for input(s): LIPASE, AMYLASE in the last 168 hours.  Recent Labs  Lab 11/03/20 1015  DDIMER 0.84*    ------------------------------------------------------------------------------------------------------------------ No results for input(s): CHOL, HDL, LDLCALC, TRIG, CHOLHDL, LDLDIRECT in the last 72 hours.  Lab Results  Component Value Date   HGBA1C 5.0 11/02/2020   ------------------------------------------------------------------------------------------------------------------ Recent Labs    11/07/20 1247  TSH 11.895*   ------------------------------------------------------------------------------------------------------------------ No results for input(s): VITAMINB12, FOLATE, FERRITIN, TIBC, IRON, RETICCTPCT in the last 72 hours.  Coagulation profile No results for input(s): INR, PROTIME in the last 168 hours. No results for input(s): DDIMER in the last 72 hours.  Cardiac Enzymes No results for input(s): CKTOTAL, CKMB, CKMBINDEX, TROPONINI in the last 168 hours.  ------------------------------------------------------------------------------------------------------------------ No results found for: BNP   Antibiotics: Anti-infectives (From admission, onward)    None        Radiology Reports  No results found.    DVT prophylaxis: SCDs  Code Status: Full code  Family Communication: No family at bedside   Consultants: Orthopedics  Procedures:     Objective    Physical Examination:  General-appears in no acute distress Heart-S1-S2, regular, no murmur auscultated Lungs-clear to auscultation bilaterally, no wheezing or crackles auscultated Abdomen-soft, nontender, no organomegaly Extremities-no edema in the lower extremities Neuro-alert, oriented x3, no focal deficit noted  Status is: Inpatient  Dispo: The patient is from: Home               Anticipated d/c is to: Skilled nursing facility versus home              Anticipated d/c date is: 11/12/2020              Patient currently not stable for discharge  Barrier to discharge-unsafe discharge plan  COVID-19 Labs  No results for input(s): DDIMER, FERRITIN,  LDH, CRP in the last 72 hours.  Lab Results  Component Value Date   SARSCOV2NAA NEGATIVE 11/01/2020    Microbiology  Recent Results (from the past 240 hour(s))  Resp Panel by RT-PCR (Flu A&B, Covid) Nasopharyngeal Swab     Status: None   Collection Time: 11/01/20  9:58 AM   Specimen: Nasopharyngeal Swab; Nasopharyngeal(NP) swabs in vial transport medium  Result Value Ref Range Status   SARS Coronavirus 2 by RT PCR NEGATIVE NEGATIVE Final    Comment: (NOTE) SARS-CoV-2 target nucleic acids are NOT DETECTED.  The SARS-CoV-2 RNA is generally detectable in upper respiratory specimens during the acute phase of infection. The lowest concentration of SARS-CoV-2 viral copies this assay can detect is 138 copies/mL. A negative result does not preclude SARS-Cov-2 infection and should not be used as the sole basis for treatment or other patient management decisions. A negative result may occur with  improper specimen collection/handling, submission of specimen other than nasopharyngeal swab, presence of viral mutation(s) within the areas targeted by this assay, and inadequate number of viral copies(<138 copies/mL). A negative result must be combined with clinical observations, patient history, and epidemiological information. The expected result is Negative.  Fact Sheet for Patients:  BloggerCourse.com  Fact Sheet for Healthcare Providers:  SeriousBroker.it  This test is no t yet approved or cleared by the [place] FDA and  has been authorized for detection and/or diagnosis of SARS-CoV-2 by FDA under an Emergency Use Authorization (EUA). This EUA will remain  in  effect (meaning this test can be used) for the duration of the COVID-19 declaration under Section 564(b)(1) of the Act, 21 U.S.C.section 360bbb-3(b)(1), unless the authorization is terminated  or revoked sooner.       Influenza A by PCR NEGATIVE NEGATIVE Final   Influenza B by PCR NEGATIVE NEGATIVE Final    Comment: (NOTE) The Xpert Xpress SARS-CoV-2/FLU/RSV plus assay is intended as an aid in the diagnosis of influenza from Nasopharyngeal swab specimens and should not be used as a sole basis for treatment. Nasal washings and aspirates are unacceptable for Xpert Xpress SARS-CoV-2/FLU/RSV testing.  Fact Sheet for Patients: BloggerCourse.com  Fact Sheet for Healthcare Providers: SeriousBroker.it  This test is not yet approved or cleared by the [place] FDA and has been authorized for detection and/or diagnosis of SARS-CoV-2 by FDA under an Emergency Use Authorization (EUA). This EUA will remain in effect (meaning this test can be used) for the duration of the COVID-19 declaration under Section 564(b)(1) of the Act, 21 U.S.C. section 360bbb-3(b)(1), unless the authorization is terminated or revoked.  Performed at [facility], [address], [place], [place] [address]              Meredeth Ide   [facility] If 7PM-7AM, please contact night-coverage at [email or web address], Office  [phone]   11/09/2020, 6:15 PM  LOS: 8 days

## 2020-11-09 NOTE — Progress Notes (Signed)
Physical Therapy Treatment Patient Details Name: Carla Little MRN: [id number] DOB: [birth year] Today's Date: 11/09/2020    History of Present Illness Carla Little is an 30 y.o.  who is morbidly obese, history of depression and got up to go the bathroom and felt a pop in her knee and had sudden instability and was unable to get up, EMS transported to the ER and found to have a knee dislocation which was reduced with fractures of the proximal fibula ligamentous avulsion laterally as well as medially off the medial femoral condyle and MRI showed complete ACL tear, PCL tear and MCL strain and has a large effusion and is going to be in knee immobilizer.  Vascular ultrasounds done and nondiagnostic. As of 8/13 she was placed in a L Bledsoe brace with WBAT orders and no flexion of knee.    PT Comments    Pt limited by dizziness and feeling as if she would "pass out" in standing. BP in sitting 142/78, standing 120/74, RN made aware. Pt back to bed for brace adjustment and repositioning (brace again found loose and around ankle, shifted distally). Pt getting skin breakdown posterior thigh from brace shearing, difficult fit d/t body habitus. Will plan to provide padding for brace next session. Discussed with pt as well.    Follow Up Recommendations  Other (comment) (no ins, likely no f/u)     Equipment Recommendations  Rolling walker with 5" wheels (wide, short--?)    Recommendations for Other Services       Precautions / Restrictions Precautions Precautions: Fall Precaution Comments: NO FLEXION LEFT KNEE, BLEDSOE BRACE AT ALL TIMES Required Braces or Orthoses: Other Brace Knee Immobilizer - Left: On at all times Other Brace: Bledsoe brace Restrictions Weight Bearing Restrictions: No LLE Weight Bearing: Weight bearing as tolerated Other Position/Activity Restrictions: PT orders state attempt WBAT with L Bledsoe brace with no flexion of L knee    Mobility  Bed Mobility Overal bed  mobility: Needs Assistance Bed Mobility: Supine to Sit;Sit to Supine     Supine to sit: Supervision Sit to supine: Supervision   General bed mobility comments: no physical assist from flat bed    Transfers Overall transfer level: Needs assistance Equipment used: Rolling walker (2 wheeled) Transfers: Sit to/from Stand Sit to Stand: Min guard;+2 safety/equipment         General transfer comment: for safety, cues for hand placement and LLE position  Ambulation/Gait             General Gait Details: unable to amb d/t dizziness in standing   Stairs             Wheelchair Mobility    Modified Rankin (Stroke Patients Only)       Balance   Sitting-balance support: No upper extremity supported;Feet supported Sitting balance-Leahy Scale: Good     Standing balance support: Bilateral upper extremity supported Standing balance-Leahy Scale: Fair Standing balance comment: briefly able to  maintain static stand without support                            Cognition Arousal/Alertness: Awake/alert Behavior During Therapy: WFL for tasks assessed/performed Overall Cognitive Status: Within Functional Limits for tasks assessed                                        Exercises  General Comments        Pertinent Vitals/Pain Pain Assessment: Faces Faces Pain Scale: Hurts little more Pain Location: L knee Pain Descriptors / Indicators: Grimacing;Discomfort Pain Intervention(s): Limited activity within patient's tolerance;Monitored during session;Premedicated before session;Repositioned    Home Living                      Prior Function            PT Goals (current goals can now be found in the care plan section) Acute Rehab PT Goals Patient Stated Goal: do what I have to do PT Goal Formulation: With patient/family Time For Goal Achievement: 11/16/20 Potential to Achieve Goals: Fair Progress towards PT goals:  Progressing toward goals    Frequency    Min 3X/week      PT Plan Current plan remains appropriate;Other (comment)    Co-evaluation              AM-PAC PT "6 Clicks" Mobility   Outcome Measure  Help needed turning from your back to your side while in a flat bed without using bedrails?: A Little Help needed moving from lying on your back to sitting on the side of a flat bed without using bedrails?: A Little Help needed moving to and from a bed to a chair (including a wheelchair)?: A Little Help needed standing up from a chair using your arms (e.g., wheelchair or bedside chair)?: A Little Help needed to walk in hospital room?: A Little Help needed climbing 3-5 steps with a railing? : Total 6 Click Score: 16    End of Session Equipment Utilized During Treatment: Gait belt;Other (comment) (Bledsoe brace LLE) Activity Tolerance: Treatment limited secondary to medical complications (Comment) (orthostatic) Patient left: in bed;with bed alarm set;with call bell/phone within reach Nurse Communication: Mobility status PT Visit Diagnosis: Unsteadiness on feet (R26.81);History of falling (Z91.81);Pain Pain - Right/Left: Left Pain - part of body: Knee     Time: 9381-8299 PT Time Calculation (min) (ACUTE ONLY): 51 min  Charges:  $Therapeutic Activity: 38-52 mins                     Delice Bison, PT  Acute Rehab Dept ([facility]) [phone] Pager [phone]  11/09/2020    [facility] 11/09/2020, 4:09 PM

## 2020-11-09 NOTE — Progress Notes (Signed)
Patient ID: Carla Little, female   DOB: 1991-02-10, 30 y.o.   MRN: [id number] There has been no acute change in the patient's medical status.  Her mobility is significantly limited secondary to pain and body habitus.  It is difficult keeping the brace fitting appropriately on her left lower extremity due to her body size and habitus in this area.  However, it is now been over a week since her dislocation.  I would still not recommend external fixation because I think this would be harder on her and have significant risks.  The goal is to let the knee due to get stiff as possible in order to support her weight more easily as it scars down.  Still no further recommendations from orthopedic standpoint.  Her calf is soft on the left side and her foot is well-perfused.  She is able to flex and extend her left ankle, foot and toes.

## 2020-11-09 NOTE — Plan of Care (Signed)
  Problem: Activity: Goal: Risk for activity intolerance will decrease Outcome: Progressing   Problem: Coping: Goal: Level of anxiety will decrease Outcome: Progressing   Problem: Elimination: Goal: Will not experience complications related to bowel motility Outcome: Progressing Goal: Will not experience complications related to urinary retention Outcome: Progressing   Problem: Pain Managment: Goal: General experience of comfort will improve Outcome: Progressing   

## 2020-11-09 NOTE — Progress Notes (Signed)
PT Cancellation Note  Patient Details Name: Carla Little MRN: [id number] DOB: 1990-10-12   Cancelled Treatment:    Reason Eval/Treat Not Completed: Other (comment). Pt declined d/t N/V. Reports she has been OOB to [facility] multiple times today but feels too weak to get up again, continues to be nauseous.   [facility] 11/09/2020, 9:28 AM

## 2020-11-10 DIAGNOSIS — R300 Dysuria: Secondary | ICD-10-CM

## 2020-11-10 LAB — URINALYSIS, ROUTINE W REFLEX MICROSCOPIC
Bilirubin Urine: NEGATIVE
Glucose, UA: NEGATIVE mg/dL
Hgb urine dipstick: NEGATIVE
Ketones, ur: NEGATIVE mg/dL
Nitrite: POSITIVE — AB
Protein, ur: 100 mg/dL — AB
Specific Gravity, Urine: 1.02 (ref 1.005–1.030)
WBC, UA: >50 WBC/hpf — ABNORMAL HIGH (ref 0–5)
pH: 8 (ref 5.0–8.0)

## 2020-11-10 NOTE — Progress Notes (Signed)
[facility]  PROGRESS NOTE  Carla Little JGG:[id number] DOB: 1990/05/21 DOA: 10/31/2020 PCP: Patient, No Pcp Per (Inactive)   Brief HPI:   30 year old female with history of morbid obesity, anxiety/depression, who felt a pop in the left knee when ambulating and suddenly experience instability with resultant mechanical fall.  EMS was called and she was found to have dislocated her knee.  This was recently reduced in the ED, MRI revealed a complete ACL tear, PCL tear and MCL strain as well as large effusion.  Orthopedic surgery was consulted and is managing with brace.  No plan for surgery.  Therapy recommended skilled nursing facility.    Subjective   Patient seen and examined, complains of dysuria.   Assessment/Plan:     Left knee dislocation -ACL and PCL tear/avulsion fracture involving the head of fibula/patellar dislocation/MCL sprain -Orthopedics consulted -Bledsoe hinged knee brace with serial exams to ensure that knee remains in place -Pain controlled with oral pain medications and bowel regimen  Persistent sinus tachycardia -D-dimer was elevated; CTA chest was negative for PE -Continue low-dose metoprolol 12.5 mg p.o. twice daily   Acute urinary retention -Resolved -Continue Urecholine  Dysuria -We will obtain urinalysis and urine culture  Acute hypoxemic respiratory failure -Concern for undiagnosed OHS/OSA -Will need outpatient sleep study  Iron-deficiency anemia -Ferritin low at 11, iron saturation 6% -Received IV iron on 8/12 -Continue p.o. iron supplementation  Hyponatremia -Resolved  Thrombocytosis -Improved -Likely reactive  Euthyroid sick syndrome -TSH 11.895, T4 0.99 -Check TSH in 4 to 6 weeks as outpatient   Scheduled medications:    Chlorhexidine Gluconate Cloth  6 each Topical Daily   cholecalciferol  2,000 Units Oral Daily   enoxaparin (LOVENOX) injection  0.5 mg/kg Subcutaneous Q24H   ferrous sulfate  325 mg Oral Q breakfast    mouth rinse  15 mL Mouth Rinse BID   metoprolol tartrate  12.5 mg Oral BID   senna-docusate  1 tablet Oral BID         Data Reviewed:   CBG:  No results for input(s): GLUCAP in the last 168 hours.  SpO2: 90 % O2 Flow Rate (L/min): 2 L/min    Vitals:   11/09/20 1300 11/09/20 1933 11/10/20 0525 11/10/20 1505  BP:  128/73 118/83 122/82  Pulse:  (!) 110 93 (!) 102  Resp:  16 18 20   Temp:  98.3 F (36.8 C) 98.3 F (36.8 C) 98.4 F (36.9 C)  TempSrc:  Oral Oral Oral  SpO2: 100% 99%  90%  Weight:      Height:         Intake/Output Summary (Last 24 hours) at 11/10/2020 1556 Last data filed at 11/09/2020 2000 Gross per 24 hour  Intake 120 ml  Output --  Net 120 ml    08/16 1901 - 08/18 0700 In: 480 [P.O.:480] Out: 350 [Urine:350]  Filed Weights   11/01/20 0026  Weight: (!) 181.4 kg    CBC:  Recent Labs  Lab 11/04/20 0408 11/05/20 0411 11/06/20 0331 11/07/20 0313  WBC 5.8 9.9 7.8  --   HGB 8.2* 8.5* 8.4* 8.3*  HCT 27.8* 29.5* 28.5* 28.5*  PLT 441* 507* 477*  --   MCV 82.5 82.4 82.6  --   MCH 24.3* 23.7* 24.3*  --   MCHC 29.5* 28.8* 29.5*  --   RDW 21.2* 21.5* 21.4*  --     Complete metabolic panel:  Recent Labs  Lab 11/04/20 0408 11/05/20 0411 11/06/20 0331 11/07/20 0313 11/07/20  1247  NA 138 134* 136 138  --   K 3.4* 3.9 3.2* 3.6  --   CL 101 99 97* 100  --   CO2 28 28 31 30   --   GLUCOSE 92 94 114* 105*  --   BUN <5* <5* 6 <5*  --   CREATININE 0.51 0.48 0.60 0.65  --   CALCIUM 8.5* 8.5* 8.6* 8.9  --   AST 44*  --   --   --   --   ALT 30  --   --   --   --   ALKPHOS 60  --   --   --   --   BILITOT 1.0  --   --   --   --   ALBUMIN 3.0*  --  3.0* 3.1*  --   MG  --   --  2.0 2.1  --   TSH  --   --   --   --  11.895*    No results for input(s): LIPASE, AMYLASE in the last 168 hours.  No results for input(s): CRP, DDIMER, BNP, PROCALCITON, SARSCOV2NAA in the last 168 hours.  Invalid input(s): LACTICACID    ------------------------------------------------------------------------------------------------------------------ No results for input(s): CHOL, HDL, LDLCALC, TRIG, CHOLHDL, LDLDIRECT in the last 72 hours.  Lab Results  Component Value Date   HGBA1C 5.0 11/02/2020   ------------------------------------------------------------------------------------------------------------------ No results for input(s): TSH, T4TOTAL, T3FREE, THYROIDAB in the last 72 hours.  Invalid input(s): FREET3  ------------------------------------------------------------------------------------------------------------------ No results for input(s): VITAMINB12, FOLATE, FERRITIN, TIBC, IRON, RETICCTPCT in the last 72 hours.  Coagulation profile No results for input(s): INR, PROTIME in the last 168 hours. No results for input(s): DDIMER in the last 72 hours.  Cardiac Enzymes No results for input(s): CKTOTAL, CKMB, CKMBINDEX, TROPONINI in the last 168 hours.  ------------------------------------------------------------------------------------------------------------------ No results found for: BNP   Antibiotics: Anti-infectives (From admission, onward)    None        Radiology Reports  No results found.    DVT prophylaxis: SCDs  Code Status: Full code  Family Communication: No family at bedside   Consultants: Orthopedics  Procedures:     Objective    Physical Examination:  General-appears in no acute distress Heart-S1-S2, regular, no murmur auscultated Lungs-clear to auscultation bilaterally, no wheezing or crackles auscultated Abdomen-soft, nontender, no organomegaly Extremities-no edema in the lower extremities Neuro-alert, oriented x3, no focal deficit noted  Status is: Inpatient  Dispo: The patient is from: Home              Anticipated d/c is to: Skilled nursing facility versus home              Anticipated d/c date is: 11/12/2020              Patient currently not  stable for discharge  Barrier to discharge-unsafe discharge plan  COVID-19 Labs  No results for input(s): DDIMER, FERRITIN, LDH, CRP in the last 72 hours.  Lab Results  Component Value Date   SARSCOV2NAA NEGATIVE 11/01/2020    Microbiology  Recent Results (from the past 240 hour(s))  Resp Panel by RT-PCR (Flu A&B, Covid) Nasopharyngeal Swab     Status: None   Collection Time: 11/01/20  9:58 AM   Specimen: Nasopharyngeal Swab; Nasopharyngeal(NP) swabs in vial transport medium  Result Value Ref Range Status   SARS Coronavirus 2 by RT PCR NEGATIVE NEGATIVE Final    Comment: (NOTE) SARS-CoV-2 target nucleic acids are NOT DETECTED.  The SARS-CoV-2  RNA is generally detectable in upper respiratory specimens during the acute phase of infection. The lowest concentration of SARS-CoV-2 viral copies this assay can detect is 138 copies/mL. A negative result does not preclude SARS-Cov-2 infection and should not be used as the sole basis for treatment or other patient management decisions. A negative result may occur with  improper specimen collection/handling, submission of specimen other than nasopharyngeal swab, presence of viral mutation(s) within the areas targeted by this assay, and inadequate number of viral copies(<138 copies/mL). A negative result must be combined with clinical observations, patient history, and epidemiological information. The expected result is Negative.  Fact Sheet for Patients:  BloggerCourse.com  Fact Sheet for Healthcare Providers:  SeriousBroker.it  This test is no t yet approved or cleared by the [place] FDA and  has been authorized for detection and/or diagnosis of SARS-CoV-2 by FDA under an Emergency Use Authorization (EUA). This EUA will remain  in effect (meaning this test can be used) for the duration of the COVID-19 declaration under Section 564(b)(1) of the Act, 21 U.S.C.section  360bbb-3(b)(1), unless the authorization is terminated  or revoked sooner.       Influenza A by PCR NEGATIVE NEGATIVE Final   Influenza B by PCR NEGATIVE NEGATIVE Final    Comment: (NOTE) The Xpert Xpress SARS-CoV-2/FLU/RSV plus assay is intended as an aid in the diagnosis of influenza from Nasopharyngeal swab specimens and should not be used as a sole basis for treatment. Nasal washings and aspirates are unacceptable for Xpert Xpress SARS-CoV-2/FLU/RSV testing.  Fact Sheet for Patients: BloggerCourse.com  Fact Sheet for Healthcare Providers: SeriousBroker.it  This test is not yet approved or cleared by the [place] FDA and has been authorized for detection and/or diagnosis of SARS-CoV-2 by FDA under an Emergency Use Authorization (EUA). This EUA will remain in effect (meaning this test can be used) for the duration of the COVID-19 declaration under Section 564(b)(1) of the Act, 21 U.S.C. section 360bbb-3(b)(1), unless the authorization is terminated or revoked.  Performed at [facility], [address] [place], [place] [address]         Meredeth Ide   [facility] If 7PM-7AM, please contact night-coverage at [email or web address], Office  [phone]   11/10/2020, 3:56 PM  LOS: 9 days

## 2020-11-10 NOTE — Plan of Care (Signed)
  Problem: Activity: Goal: Risk for activity intolerance will decrease Outcome: Progressing   Problem: Nutrition: Goal: Adequate nutrition will be maintained Outcome: Progressing   Problem: Coping: Goal: Level of anxiety will decrease Outcome: Progressing   Problem: Elimination: Goal: Will not experience complications related to bowel motility Outcome: Progressing   Problem: Pain Managment: Goal: General experience of comfort will improve Outcome: Progressing   Problem: Skin Integrity: Goal: Risk for impaired skin integrity will decrease Outcome: Progressing   

## 2020-11-10 NOTE — Progress Notes (Signed)
Physical Therapy Treatment Patient Details Name: Carla Little MRN: [id number] DOB: Dec 14, 1990 Today's Date: 11/10/2020    History of Present Illness Carla Little is an 30 y.o.  who is morbidly obese, history of depression and got up to go the bathroom and felt a pop in her knee and had sudden instability and was unable to get up, EMS transported to the ER and found to have a knee dislocation which was reduced with fractures of the proximal fibula ligamentous avulsion laterally as well as medially off the medial femoral condyle and MRI showed complete ACL tear, PCL tear and MCL strain and has a large effusion and is going to be in knee immobilizer.  Vascular ultrasounds done and nondiagnostic. As of 8/13 she was placed in a L Bledsoe brace with WBAT orders and no flexion of knee.    PT Comments    Treatment focussed on adapting and providing a cushion leayer  to Bledsoe brace. Place a KI with stays removed around leg then Bledsoe. Patient reports feeling comfortable but may be too hot. Patient informed OK to loosen but make sure staff snugs the straps prior to getting OOB.  Patient reports feeling weak, dizzy when up earlier and has recently vomited after medication. Will progress activity and monitor BP tomorrow.    Follow Up Recommendations  Other (comment)     Equipment Recommendations  Rolling walker with 5" wheels;3in1 (PT)    Recommendations for Other Services       Precautions / Restrictions Precautions Precaution Comments: NO FLEXION LEFT KNEE, BLEDSOE BRACE AT ALL TIMES Required Braces or Orthoses: Other Brace Other Brace: Bledsoe brace    Mobility  Bed Mobility               General bed mobility comments: nt time spent adapting brace.    Transfers                    Ambulation/Gait                 Stairs             Wheelchair Mobility    Modified Rankin (Stroke Patients Only)       Balance                                             Cognition Arousal/Alertness: Awake/alert                                            Exercises      General Comments        Pertinent Vitals/Pain Pain Score: 6  Pain Location: L knee Pain Descriptors / Indicators: Grimacing;Discomfort Pain Intervention(s): Premedicated before session;Repositioned    Home Living                      Prior Function            PT Goals (current goals can now be found in the care plan section) Progress towards PT goals: Progressing toward goals    Frequency    Min 3X/week      PT Plan Current plan remains appropriate;Other (comment)    Co-evaluation  AM-PAC PT "6 Clicks" Mobility   Outcome Measure  Help needed turning from your back to your side while in a flat bed without using bedrails?: A Little Help needed moving from lying on your back to sitting on the side of a flat bed without using bedrails?: A Little Help needed moving to and from a bed to a chair (including a wheelchair)?: A Little Help needed standing up from a chair using your arms (e.g., wheelchair or bedside chair)?: A Little Help needed to walk in hospital room?: A Little Help needed climbing 3-5 steps with a railing? : Total 6 Click Score: 16    End of Session   Activity Tolerance: Patient tolerated treatment well Patient left: in bed;with call bell/phone within reach;with family/visitor present Nurse Communication: Mobility status PT Visit Diagnosis: Unsteadiness on feet (R26.81);History of falling (Z91.81);Pain Pain - Right/Left: Left Pain - part of body: Knee     Time: 2111-7356 PT Time Calculation (min) (ACUTE ONLY): 31 min  Charges:  $Self Care/Home Management: 23-37                     Blanchard Kelch PT Acute Rehabilitation Services Pager [phone] Office [phone]    Rada Hay 11/10/2020, 5:42 PM

## 2020-11-11 DIAGNOSIS — N390 Urinary tract infection, site not specified: Secondary | ICD-10-CM

## 2020-11-11 LAB — CBC
HCT: 27.8 % — ABNORMAL LOW (ref 36.0–46.0)
Hemoglobin: 8.2 g/dL — ABNORMAL LOW (ref 12.0–15.0)
MCH: 25.9 pg — ABNORMAL LOW (ref 26.0–34.0)
MCHC: 29.5 g/dL — ABNORMAL LOW (ref 30.0–36.0)
MCV: 87.7 fL (ref 80.0–100.0)
Platelets: 300 10*3/uL (ref 150–400)
RBC: 3.17 MIL/uL — ABNORMAL LOW (ref 3.87–5.11)
RDW: 25 % — ABNORMAL HIGH (ref 11.5–15.5)
WBC: 8.6 10*3/uL (ref 4.0–10.5)
nRBC: 1.6 % — ABNORMAL HIGH (ref 0.0–0.2)

## 2020-11-11 LAB — BASIC METABOLIC PANEL
Anion gap: 9 (ref 5–15)
BUN: 8 mg/dL (ref 6–20)
CO2: 26 mmol/L (ref 22–32)
Calcium: 8.5 mg/dL — ABNORMAL LOW (ref 8.9–10.3)
Chloride: 100 mmol/L (ref 98–111)
Creatinine, Ser: 0.59 mg/dL (ref 0.44–1.00)
GFR, Estimated: >60 mL/min (ref >60)
Glucose, Bld: 94 mg/dL (ref 70–99)
Potassium: 3.5 mmol/L (ref 3.5–5.1)
Sodium: 135 mmol/L (ref 135–145)

## 2020-11-11 MED ORDER — SODIUM CHLORIDE 0.9 % IV SOLN
1.0000 g | INTRAVENOUS | Status: DC
  Administered 2020-11-11 – 2020-11-12 (×2): 1 g via INTRAVENOUS
  Filled 2020-11-11 (×2): qty 1
  Filled 2020-11-11: qty 10

## 2020-11-11 MED ORDER — BISACODYL 10 MG RE SUPP
10.0000 mg | Freq: Once | RECTAL | Status: DC
  Filled 2020-11-11: qty 1

## 2020-11-11 NOTE — Plan of Care (Signed)
  Problem: Health Behavior/Discharge Planning: Goal: Ability to manage health-related needs will improve Outcome: Progressing   Problem: Education: Goal: Knowledge of General Education information will improve Description: Including pain rating scale, medication(s)/side effects and non-pharmacologic comfort measures Outcome: Progressing   Problem: Clinical Measurements: Goal: Ability to maintain clinical measurements within normal limits will improve Outcome: Progressing   Problem: Clinical Measurements: Goal: Will remain free from infection Outcome: Progressing   

## 2020-11-11 NOTE — Progress Notes (Signed)
[facility]  PROGRESS NOTE  Carla Little ZOX:[id number] DOB: 27-Aug-1990 DOA: 10/31/2020 PCP: Patient, No Pcp Per (Inactive)   Brief HPI:   30 year old female with history of morbid obesity, anxiety/depression, who felt a pop in the left knee when ambulating and suddenly experience instability with resultant mechanical fall.  EMS was called and she was found to have dislocated her knee.  This was recently reduced in the ED, MRI revealed a complete ACL tear, PCL tear and MCL strain as well as large effusion.  Orthopedic surgery was consulted and is managing with brace.  No plan for surgery.  Therapy recommended skilled nursing facility.    Subjective   Patient seen and examined, UA obtained yesterday was abnormal with positive nitrite.  Urine culture has been obtained.  Still complains of dysuria.   Assessment/Plan:     Left knee dislocation -ACL and PCL tear/avulsion fracture involving the head of fibula/patellar dislocation/MCL sprain -Orthopedics consulted -Bledsoe hinged knee brace with serial exams to ensure that knee remains in place -Pain controlled with oral pain medications and bowel regimen  UTI -Abnormal UA, urine culture obtained -We will start IV Rocephin empirically -Follow urine culture results.  Persistent sinus tachycardia -D-dimer was elevated; CTA chest was negative for PE -Continue low-dose metoprolol 12.5 mg p.o. twice daily  Acute urinary retention -Resolved -Continue Urecholine  Acute hypoxemic respiratory failure -Concern for undiagnosed OHS/OSA -Will need outpatient sleep study  Iron-deficiency anemia -Ferritin low at 11, iron saturation 6% -Received IV iron on 8/12 -Continue p.o. iron supplementation  Hyponatremia -Resolved  Thrombocytosis -Improved -Likely reactive  Euthyroid sick syndrome -TSH 11.895, T4 0.99 -Check TSH in 4 to 6 weeks as outpatient   Scheduled medications:    bisacodyl  10 mg Rectal Once    Chlorhexidine Gluconate Cloth  6 each Topical Daily   cholecalciferol  2,000 Units Oral Daily   enoxaparin (LOVENOX) injection  0.5 mg/kg Subcutaneous Q24H   ferrous sulfate  325 mg Oral Q breakfast   mouth rinse  15 mL Mouth Rinse BID   metoprolol tartrate  12.5 mg Oral BID   senna-docusate  1 tablet Oral BID         Data Reviewed:   CBG:  No results for input(s): GLUCAP in the last 168 hours.  SpO2: 92 % O2 Flow Rate (L/min): 2 L/min    Vitals:   11/10/20 1505 11/10/20 2129 11/11/20 0500 11/11/20 0552  BP: 122/82 125/76 115/74 115/74  Pulse: (!) 102 100 100 100  Resp: 20 20 20 20   Temp: 98.4 F (36.9 C) 99.1 F (37.3 C) 98.6 F (37 C) 98.6 F (37 C)  TempSrc: Oral Oral Oral Oral  SpO2: 90% 99% 99% 92%  Weight:      Height:         Intake/Output Summary (Last 24 hours) at 11/11/2020 1504 Last data filed at 11/11/2020 0116 Gross per 24 hour  Intake --  Output 450 ml  Net -450 ml    08/17 1901 - 08/19 0700 In: 120 [P.O.:120] Out: 450 [Urine:450]  Filed Weights   11/01/20 0026  Weight: (!) 181.4 kg    CBC:  Recent Labs  Lab 11/05/20 0411 11/06/20 0331 11/07/20 0313 11/11/20 0332  WBC 9.9 7.8  --  8.6  HGB 8.5* 8.4* 8.3* 8.2*  HCT 29.5* 28.5* 28.5* 27.8*  PLT 507* 477*  --  300  MCV 82.4 82.6  --  87.7  MCH 23.7* 24.3*  --  25.9*  MCHC 28.8*  29.5*  --  29.5*  RDW 21.5* 21.4*  --  25.0*    Complete metabolic panel:  Recent Labs  Lab 11/05/20 0411 11/06/20 0331 11/07/20 0313 11/07/20 1247 11/11/20 0332  NA 134* 136 138  --  135  K 3.9 3.2* 3.6  --  3.5  CL 99 97* 100  --  100  CO2 28 31 30   --  26  GLUCOSE 94 114* 105*  --  94  BUN <5* 6 <5*  --  8  CREATININE 0.48 0.60 0.65  --  0.59  CALCIUM 8.5* 8.6* 8.9  --  8.5*  ALBUMIN  --  3.0* 3.1*  --   --   MG  --  2.0 2.1  --   --   TSH  --   --   --  11.895*  --     No results for input(s): LIPASE, AMYLASE in the last 168 hours.  No results for input(s): CRP, DDIMER, BNP,  PROCALCITON, SARSCOV2NAA in the last 168 hours.  Invalid input(s): LACTICACID   ------------------------------------------------------------------------------------------------------------------ No results for input(s): CHOL, HDL, LDLCALC, TRIG, CHOLHDL, LDLDIRECT in the last 72 hours.  Lab Results  Component Value Date   HGBA1C 5.0 11/02/2020   ------------------------------------------------------------------------------------------------------------------ No results for input(s): TSH, T4TOTAL, T3FREE, THYROIDAB in the last 72 hours.  Invalid input(s): FREET3  ------------------------------------------------------------------------------------------------------------------ No results for input(s): VITAMINB12, FOLATE, FERRITIN, TIBC, IRON, RETICCTPCT in the last 72 hours.  Coagulation profile No results for input(s): INR, PROTIME in the last 168 hours. No results for input(s): DDIMER in the last 72 hours.  Cardiac Enzymes No results for input(s): CKTOTAL, CKMB, CKMBINDEX, TROPONINI in the last 168 hours.  ------------------------------------------------------------------------------------------------------------------ No results found for: BNP   Antibiotics: Anti-infectives (From admission, onward)    Start     Dose/Rate Route Frequency Ordered Stop   11/11/20 1130  cefTRIAXone (ROCEPHIN) 1 g in sodium chloride 0.9 % 100 mL IVPB        1 g 200 mL/hr over 30 Minutes Intravenous Every 24 hours 11/11/20 1033          Radiology Reports  No results found.    DVT prophylaxis: SCDs  Code Status: Full code  Family Communication: No family at bedside   Consultants: Orthopedics  Procedures:     Objective    Physical Examination:   General-appears in no acute distress Heart-S1-S2, regular, no murmur auscultated Lungs-clear to auscultation bilaterally, no wheezing or crackles auscultated Abdomen-soft, nontender, no organomegaly Extremities-no edema in the  lower extremities Neuro-alert, oriented x3, no focal deficit noted  Status is: Inpatient  Dispo: The patient is from: Home              Anticipated d/c is to: Skilled nursing facility versus home              Anticipated d/c date is: 11/15/2020              Patient currently not stable for discharge  Barrier to discharge-unsafe discharge plan  COVID-19 Labs  No results for input(s): DDIMER, FERRITIN, LDH, CRP in the last 72 hours.  Lab Results  Component Value Date   SARSCOV2NAA NEGATIVE 11/01/2020    Microbiology  No results found for this or any previous visit (from the past 240 hour(s)).       01/01/2021   [facility] If 7PM-7AM, please contact night-coverage at [email or web address], Office  [phone]   11/11/2020, 3:04 PM  LOS: 10 days

## 2020-11-11 NOTE — Progress Notes (Signed)
Patient last bowel movement 10/31/20 - patient has been taking miralax and senne without any results, also drinking prune juice regular.  Patient refusing enema yesterday and today.  MD updated.

## 2020-11-11 NOTE — Progress Notes (Signed)
Physical Therapy Treatment Patient Details Name: Carla Little MRN: [id number] DOB: March 15, 1991 Today's Date: 11/11/2020    History of Present Illness Carla Little is an 30 y.o.  who is morbidly obese, history of depression and got up to go the bathroom and felt a pop in her knee and had sudden instability and was unable to get up, EMS transported to the ER and found to have a knee dislocation which was reduced with fractures of the proximal fibula ligamentous avulsion laterally as well as medially off the medial femoral condyle and MRI showed complete ACL tear, PCL tear and MCL strain and has a large effusion and is going to be in knee immobilizer.  Vascular ultrasounds done and nondiagnostic. As of 8/13 she was placed in a L Bledsoe brace with WBAT orders and no flexion of knee.    PT Comments    The  bledsoe brace had slid to patient's foot. Repositioned with KI with stays removed for cushion.  Patient  ambulated 48' then 10'. Brace again sild down  to foot.  Removed Bledsoe and  placed KI with stays an 2 more stays in front of knee. Patient complained of pain. Attempted to stand x 3. Patient became emotional about current situation.  Continue PT for mobility.  Follow Up Recommendations  SNF     Equipment Recommendations  Rolling walker with 5" wheels;3in1 (PT)    Recommendations for Other Services       Precautions / Restrictions Precautions Precautions: Fall Precaution Comments: NO FLEXION LEFT KNEE, BLEDSOE BRACE AT ALL TIMES Required Braces or Orthoses: Other Brace Knee Immobilizer - Left: On at all times Other Brace: Bledsoe brace    Mobility  Bed Mobility   Bed Mobility: Supine to Sit Rolling: Min guard         General bed mobility comments: patient able to self mobilize to sitting on bed edge.    Transfers Overall transfer level: Needs assistance Equipment used: Rolling walker (2 wheeled)   Sit to Stand: Min guard;Min assist         General  transfer comment: patient stood from bed and recliner when bledsoe and KI was in place.. Removed Bledsoe due to poor fit and placed a black KI on  whith 2 more metal stays in front. Patient  reports increased pain and could not stnad up. Very emotional at this point.  Ambulation/Gait Ambulation/Gait assistance: Min guard;+2 safety/equipment Gait Distance (Feet): 14 Feet (then 10') Assistive device: Rolling walker (2 wheeled) Gait Pattern/deviations: Step-to pattern;Wide base of support Gait velocity: decr   General Gait Details: patient di tend to lean onto RW with RW out in front.   Stairs             Wheelchair Mobility    Modified Rankin (Stroke Patients Only)       Balance   Sitting-balance support: No upper extremity supported;Feet supported Sitting balance-Leahy Scale: Good     Standing balance support: Bilateral upper extremity supported Standing balance-Leahy Scale: Fair Standing balance comment: briefly able to  maintain static stand without support                            Cognition   Behavior During Therapy: Anxious;WFL for tasks assessed/performed Overall Cognitive Status: Within Functional Limits for tasks assessed  General Comments: very emotional today about her current situation: needs BM, pain, lost her job.      Exercises      General Comments        Pertinent Vitals/Pain Faces Pain Scale: Hurts whole lot Pain Location: L knee when attempts to stand. Pain Descriptors / Indicators: Grimacing;Discomfort;Crying Pain Intervention(s): Monitored during session;Patient requesting pain meds-RN notified;Repositioned;Limited activity within patient's tolerance    Home Living                      Prior Function            PT Goals (current goals can now be found in the care plan section) Progress towards PT goals: Progressing toward goals    Frequency    Min  3X/week      PT Plan Current plan remains appropriate;Other (comment)    Co-evaluation              AM-PAC PT "6 Clicks" Mobility   Outcome Measure  Help needed turning from your back to your side while in a flat bed without using bedrails?: A Little Help needed moving from lying on your back to sitting on the side of a flat bed without using bedrails?: A Little Help needed moving to and from a bed to a chair (including a wheelchair)?: A Lot Help needed standing up from a chair using your arms (e.g., wheelchair or bedside chair)?: A Little Help needed to walk in hospital room?: A Little Help needed climbing 3-5 steps with a railing? : Total 6 Click Score: 15    End of Session Equipment Utilized During Treatment: Gait belt Activity Tolerance: Patient tolerated treatment well Patient left: in chair;with call bell/phone within reach;with family/visitor present Nurse Communication: Mobility status PT Visit Diagnosis: Unsteadiness on feet (R26.81);History of falling (Z91.81);Pain Pain - Right/Left: Left Pain - part of body: Knee     Time: 1200-1304 PT Time Calculation (min) (ACUTE ONLY): 64 min  Charges:  $Gait Training: 23-37 mins $Self Care/Home Management: 23-37                     Blanchard Kelch PT Acute Rehabilitation Services Pager [phone] Office [phone]    Rada Hay 11/11/2020, 4:13 PM

## 2020-11-12 ENCOUNTER — Inpatient Hospital Stay (HOSPITAL_COMMUNITY): Payer: Medicaid Other

## 2020-11-12 DIAGNOSIS — S83105D Unspecified dislocation of left knee, subsequent encounter: Secondary | ICD-10-CM

## 2020-11-12 MED ORDER — BISACODYL 10 MG RE SUPP
10.0000 mg | Freq: Once | RECTAL | Status: AC
  Administered 2020-11-12: 10 mg via RECTAL
  Filled 2020-11-12: qty 1

## 2020-11-12 MED ORDER — MILK AND MOLASSES ENEMA
1.0000 | Freq: Once | RECTAL | Status: DC
  Filled 2020-11-12: qty 240

## 2020-11-12 MED ORDER — BISACODYL 10 MG RE SUPP: 10.0000 mg | Freq: Every day | RECTAL | Status: DC | PRN

## 2020-11-12 MED ORDER — SENNOSIDES-DOCUSATE SODIUM 8.6-50 MG PO TABS
2.0000 | ORAL_TABLET | Freq: Two times a day (BID) | ORAL | Status: DC
  Administered 2020-11-12 – 2020-11-21 (×4): 2 via ORAL
  Filled 2020-11-12 (×13): qty 2

## 2020-11-12 MED ORDER — POLYETHYLENE GLYCOL 3350 17 G PO PACK
17.0000 g | PACK | Freq: Two times a day (BID) | ORAL | Status: DC
  Administered 2020-11-12: 17 g via ORAL
  Filled 2020-11-12 (×13): qty 1

## 2020-11-12 MED ORDER — ACETAMINOPHEN 500 MG PO TABS
1000.0000 mg | ORAL_TABLET | Freq: Three times a day (TID) | ORAL | Status: DC
  Administered 2020-11-12 – 2020-11-22 (×11): 1000 mg via ORAL
  Filled 2020-11-12 (×27): qty 2

## 2020-11-12 MED ORDER — FLEET ENEMA 7-19 GM/118ML RE ENEM
1.0000 | ENEMA | Freq: Every day | RECTAL | Status: DC | PRN
  Filled 2020-11-12: qty 1

## 2020-11-12 NOTE — Plan of Care (Signed)
  Problem: Education: Goal: Knowledge of General Education information will improve Description Including pain rating scale, medication(s)/side effects and non-pharmacologic comfort measures Outcome: Progressing   Problem: Health Behavior/Discharge Planning: Goal: Ability to manage health-related needs will improve Outcome: Progressing   

## 2020-11-12 NOTE — Progress Notes (Signed)
Patient ID: Carla Little, female   DOB: 03-06-1991, 30 y.o.   MRN: [id number] X-rays of the left knee today show the knee is still located in appropriate position.  I talked to the patient about this.  The main struggle is getting knee immobilizers and knee braces to stay on her left knee which is difficult due to her body habitus.  She understands that she needs to try to mobilize as much as possible in order to get her home safely.  Hopefully the knee will continue to stay stable and scar down with time.

## 2020-11-12 NOTE — Progress Notes (Signed)
[facility]  PROGRESS NOTE  Carla Little MGQ:[id number] DOB: 1990/11/20 DOA: 10/31/2020 PCP: Patient, No Pcp Per (Inactive)   Brief HPI:   30 year old female with history of very morbid obesity, anxiety/depression, who felt a pop in the left knee when ambulating and suddenly experience instability with resultant mechanical fall.  EMS was called and she was found to have dislocated her knee.  This was recently reduced in the ED, MRI revealed a complete ACL tear, PCL tear and MCL strain as well as large effusion.  Orthopedic surgery was consulted and is managing nonoperatively with brace.  No plan for surgery.  Therapy recommended skilled nursing facility.    Subjective   Constipation.  Reportedly no BM since 10/31/2020.  No abdominal pain.  Left knee pain intermittent, 8-10/10 in severity, patient trying to avoid opioids due to constipation.  Reports nausea and occasional dizziness in upright position.  Dysuria and urinary urgency have improved since initiation of antibiotics.   Assessment/Plan:   Left knee dislocation -ACL and PCL tear/avulsion fracture involving the head of fibula/patellar dislocation/MCL sprain -Orthopedics consulted -Bledsoe hinged knee brace with serial exams to ensure that knee remains in place -Pain not controlled.  Changed Tylenol to scheduled.  As needed opioids. -Orthopedic follow-up appreciated.  X-ray of left knee redemonstrates minimally displaced fractures involving the head of the fibula as well as the medial aspect of the distal femur.  Per Dr. Doneen Poisson, x-ray shows knee still located in appropriate position.  Main struggle seems to be getting the appropriate knee immobilizer and knee brace to stay on her left knee which is difficult due to body habitus.  Patient reports that someone is coming today or tomorrow to get her a customized brace.  Proteus mirabilis acute lower UTI -Empirically started on IV ceftriaxone 8/19.  Tailor antibiotics  based on sensitivity results.  Persistent sinus tachycardia -D-dimer was elevated; CTA chest was negative for PE -Continue low-dose metoprolol 12.5 mg p.o. twice daily.  Does not seem to be on telemetry.  Mild intermittent tachycardia.  Acute urinary retention -Resolved -Continue Urecholine  Acute hypoxemic respiratory failure -Concern for undiagnosed OHS/OSA.  Not hypoxic and remains on room air. -Will need outpatient sleep study  Iron-deficiency anemia -Ferritin low at 11, iron saturation 6% -Received IV iron on 8/12 -Continue p.o. iron supplementation - Hemoglobin stable in the low 8 g range.  Hyponatremia -Resolved  Thrombocytosis Resolved, likely reactive.  Euthyroid sick syndrome -TSH 11.895, T4 0.99 -Check TSH in 4 to 6 weeks as outpatient  Constipation/?  Fecal impaction Aggressive bowel regimen initiated.  No success with trial of Dulcolax suppository.  Trial of enema-patient wishes to wait until later.  If this fails then may need to consider digital disimpaction. Check KUB for stool burden. Multifactorial due to sedentary, opioids etc.  Body mass index is 68.66 kg/m./Morbid obesity    Scheduled medications:    acetaminophen  1,000 mg Oral TID   Chlorhexidine Gluconate Cloth  6 each Topical Daily   cholecalciferol  2,000 Units Oral Daily   enoxaparin (LOVENOX) injection  0.5 mg/kg Subcutaneous Q24H   ferrous sulfate  325 mg Oral Q breakfast   mouth rinse  15 mL Mouth Rinse BID   metoprolol tartrate  12.5 mg Oral BID   milk and molasses  1 enema Rectal Once   polyethylene glycol  17 g Oral BID   senna-docusate  2 tablet Oral BID         Data Reviewed:   CBG:  SpO2: 100 % O2 Flow Rate (L/min): 2 L/min    Vitals:   11/11/20 1528 11/11/20 2100 11/12/20 0400 11/12/20 1219  BP: 125/85 130/74 117/76 110/68  Pulse: 100 (!) 108 (!) 106 86  Resp: 18 20 17  (!) 22  Temp: 98.8 F (37.1 C) 98.3 F (36.8 C) 98.7 F (37.1 C) 98.2 F (36.8 C)   TempSrc: Oral Oral Oral Oral  SpO2: 97% 99% 90% 100%  Weight:      Height:         Intake/Output Summary (Last 24 hours) at 11/12/2020 1719 Last data filed at 11/12/2020 1519 Gross per 24 hour  Intake 340 ml  Output 350 ml  Net -10 ml    08/18 1901 - 08/20 0700 In: -  Out: 800 [Urine:800]  Filed Weights   11/01/20 0026  Weight: (!) 181.4 kg    CBC:  Recent Labs  Lab 11/06/20 0331 11/07/20 0313 11/11/20 0332  WBC 7.8  --  8.6  HGB 8.4* 8.3* 8.2*  HCT 28.5* 28.5* 27.8*  PLT 477*  --  300  MCV 82.6  --  87.7  MCH 24.3*  --  25.9*  MCHC 29.5*  --  29.5*  RDW 21.4*  --  25.0*    Complete metabolic panel:  Recent Labs  Lab 11/06/20 0331 11/07/20 0313 11/07/20 1247 11/11/20 0332  NA 136 138  --  135  K 3.2* 3.6  --  3.5  CL 97* 100  --  100  CO2 31 30  --  26  GLUCOSE 114* 105*  --  94  BUN 6 <5*  --  8  CREATININE 0.60 0.65  --  0.59  CALCIUM 8.6* 8.9  --  8.5*  ALBUMIN 3.0* 3.1*  --   --   MG 2.0 2.1  --   --   TSH  --   --  11.895*  --     Antibiotics: Anti-infectives (From admission, onward)    Start     Dose/Rate Route Frequency Ordered Stop   11/11/20 1130  cefTRIAXone (ROCEPHIN) 1 g in sodium chloride 0.9 % 100 mL IVPB        1 g 200 mL/hr over 30 Minutes Intravenous Every 24 hours 11/11/20 1033          Radiology Reports  DG Knee Left Port  Result Date: 11/12/2020 CLINICAL DATA:  Fall 2 weeks ago with knee dislocation, now with persistent anterior knee pain. EXAM: PORTABLE LEFT KNEE - 1-2 VIEW COMPARISON:  11/01/2020; left tibia and fibular radiographs-10/31/2020; left knee MRI-11/01/2020 FINDINGS: Redemonstrated minimally displaced fractures involving the head of the fibula as well as the medial aspect of the distal femur. Joint spaces are preserved. Suspected small knee joint effusion. No definite evidence of lipohemarthrosis. No radiopaque foreign body. IMPRESSION: Redemonstrated minimally displaced fractures involving the head of the  fibula as well as the medial aspect of the distal femur. Electronically Signed   By: 01/01/2021 M.D.   On: 11/12/2020 11:49      DVT prophylaxis: SCDs  Code Status: Full code  Family Communication: No family at bedside   Consultants: Orthopedics  Procedures: Left knee brace.    Objective    Physical Examination:  General exam: Young female, morbidly obese lying comfortably propped up in bed without distress.  Examined along with her female RN in room. Respiratory system: Clear to auscultation. Respiratory effort normal. Cardiovascular system: S1 & S2 heard, RRR. No JVD, murmurs, rubs, gallops or clicks.  No pedal edema. Gastrointestinal system: Abdomen is nondistended, soft and nontender. No organomegaly or masses felt. Normal bowel sounds heard. Central nervous system: Alert and oriented. No focal neurological deficits. Extremities: Symmetric 5 x 5 power.  Left knee brace does not seem to be fitting her well. Skin: No rashes, lesions or ulcers Psychiatry: Judgement and insight appear normal. Mood & affect appropriate.    Status is: Inpatient  Dispo: The patient is from: Home              Anticipated d/c is to: Skilled nursing facility versus home              Anticipated d/c date is: 11/15/2020              Patient currently not stable for discharge  Barrier to discharge-unsafe discharge plan  COVID-19 Labs  No results for input(s): DDIMER, FERRITIN, LDH, CRP in the last 72 hours.  Lab Results  Component Value Date   SARSCOV2NAA NEGATIVE 11/01/2020    Microbiology  Recent Results (from the past 240 hour(s))  Urine Culture     Status: Abnormal (Preliminary result)   Collection Time: 11/10/20  4:47 PM   Specimen: Urine, Catheterized  Result Value Ref Range Status   Specimen Description   Final    URINE, CATHETERIZED Performed at [facility], [address], [place], [place] [address]    Special Requests   Final    NONE Performed at  [facility], [address] [place], [place] [address]    Culture (A)  Final    >=100,000 COLONIES/mL PROTEUS MIRABILIS SUSCEPTIBILITIES TO FOLLOW Performed at [facility], [address] [place], [place] [address]    Report Status PENDING  Incomplete        Marcellus Scott, MD, FACP, [facility]. [facility]  To contact the attending provider between 7A-7P or the covering provider during after hours 7P-7A, please log into the web site [email or web address] and access using universal [place] password for that web site. If you do not have the password, please call the hospital operator.   11/12/2020, 5:19 PM  LOS: 11 days

## 2020-11-12 NOTE — Progress Notes (Signed)
Pt had small formed green stool, post suppository. Pt states,"feels uncomfortable stool is not moving from my rectum." Offered pt fleets enema, attempted, pt unable to hold, MD updated milk and molasses enema ordered, pt refused at present time. Will ask pt later. SRP, RN

## 2020-11-13 LAB — URINE CULTURE: Culture: >=100000 — AB

## 2020-11-13 MED ORDER — MAGNESIUM HYDROXIDE 400 MG/5ML PO SUSP
30.0000 mL | Freq: Once | ORAL | Status: AC
  Administered 2020-11-13: 30 mL via ORAL
  Filled 2020-11-13: qty 30

## 2020-11-13 MED ORDER — CEFAZOLIN SODIUM-DEXTROSE 1-4 GM/50ML-% IV SOLN
1.0000 g | Freq: Three times a day (TID) | INTRAVENOUS | Status: AC
  Administered 2020-11-13 – 2020-11-17 (×14): 1 g via INTRAVENOUS
  Filled 2020-11-13 (×14): qty 50

## 2020-11-13 MED ORDER — SODIUM CHLORIDE 0.9 % IV SOLN
12.5000 mg | Freq: Four times a day (QID) | INTRAVENOUS | Status: AC | PRN
  Administered 2020-11-13 – 2020-11-16 (×2): 12.5 mg via INTRAVENOUS
  Filled 2020-11-13 (×2): qty 12.5
  Filled 2020-11-13 (×2): qty 0.5

## 2020-11-13 NOTE — Progress Notes (Signed)
[facility]  PROGRESS NOTE  Carla Little YTK:[id number] DOB: 02/07/1991 DOA: 10/31/2020 PCP: Patient, No Pcp Per (Inactive)   Brief HPI:   30 year old female with history of morbid obesity, anxiety/depression, who felt a pop in the left knee when ambulating and suddenly experience instability with resultant mechanical fall.  EMS was called and she was found to have dislocated her knee.  This was recently reduced in the ED, MRI revealed a complete ACL tear, PCL tear and MCL strain as well as large effusion.  Orthopedic surgery was consulted and is managing with brace.  No plan for surgery.  Therapy recommended skilled nursing facility.    Subjective   Patient seen and examined, still complains of constipation.  KUB obtained yesterday was unremarkable.  Feels that she has fecal impaction, passing liquid stools.  Dysuria has improved.   Assessment/Plan:     Left knee dislocation -ACL and PCL tear/avulsion fracture involving the head of fibula/patellar dislocation/MCL sprain -Orthopedics consulted -Bledsoe hinged knee brace with serial exams to ensure that knee remains in place -Pain controlled with oral pain medications and bowel regimen  UTI -Complain of dysuria, UA was abnormal.   -Urine culture grew Proteus mirabilis, sensitive to ceftriaxone  -She was empirically started on ceftriaxone  -We will switch ceftriaxone to IV Ancef today.    Persistent sinus tachycardia -D-dimer was elevated; CTA chest was negative for PE -Continue low-dose metoprolol 12.5 mg p.o. twice daily  Acute urinary retention -Resolved -Continue Urecholine  Acute hypoxemic respiratory failure -Concern for undiagnosed OHS/OSA -Will need outpatient sleep study  Iron-deficiency anemia -Ferritin low at 11, iron saturation 6% -Received IV iron on 8/12 -Continue p.o. iron supplementation  Hyponatremia -Resolved  Thrombocytosis -Improved -Likely reactive  Euthyroid sick syndrome -TSH  11.895, T4 0.99 -Check TSH in 4 to 6 weeks as outpatient  Constipation/fecal impaction -Started having liquid stools now -She did get milk of molasses enema yesterday -We will give milk of magnesia  30 cc p.o. x1 -Continue Senokot S tablet twice a day, MiraLAX 17 g p.o. twice daily -Patient wants to avoid manual fecal disimpaction for now   Scheduled medications:    acetaminophen  1,000 mg Oral TID   cholecalciferol  2,000 Units Oral Daily   enoxaparin (LOVENOX) injection  0.5 mg/kg Subcutaneous Q24H   ferrous sulfate  325 mg Oral Q breakfast   mouth rinse  15 mL Mouth Rinse BID   metoprolol tartrate  12.5 mg Oral BID   milk and molasses  1 enema Rectal Once   polyethylene glycol  17 g Oral BID   senna-docusate  2 tablet Oral BID         Data Reviewed:   CBG:  No results for input(s): GLUCAP in the last 168 hours.  SpO2: 92 % O2 Flow Rate (L/min): 2 L/min    Vitals:   11/12/20 1219 11/12/20 1941 11/13/20 0551 11/13/20 1336  BP: 110/68 105/67 106/72 122/73  Pulse: 86 90 94 (!) 108  Resp: (!) 22 16 16 18   Temp: 98.2 F (36.8 C) 97.8 F (36.6 C) 98.2 F (36.8 C) 98.6 F (37 C)  TempSrc: Oral Oral Oral Oral  SpO2: 100% 94% 94% 92%  Weight:      Height:         Intake/Output Summary (Last 24 hours) at 11/13/2020 1557 Last data filed at 11/13/2020 1000 Gross per 24 hour  Intake 240 ml  Output 800 ml  Net -560 ml    08/19 1901 -  08/21 0700 In: 700 [P.O.:600] Out: 850 [Urine:850]  Filed Weights   11/01/20 0026  Weight: (!) 181.4 kg    CBC:  Recent Labs  Lab 11/07/20 0313 11/11/20 0332  WBC  --  8.6  HGB 8.3* 8.2*  HCT 28.5* 27.8*  PLT  --  300  MCV  --  87.7  MCH  --  25.9*  MCHC  --  29.5*  RDW  --  25.0*    Complete metabolic panel:  Recent Labs  Lab 11/07/20 0313 11/07/20 1247 11/11/20 0332  NA 138  --  135  K 3.6  --  3.5  CL 100  --  100  CO2 30  --  26  GLUCOSE 105*  --  94  BUN <5*  --  8  CREATININE 0.65  --  0.59   CALCIUM 8.9  --  8.5*  ALBUMIN 3.1*  --   --   MG 2.1  --   --   TSH  --  11.895*  --     No results for input(s): LIPASE, AMYLASE in the last 168 hours.  No results for input(s): CRP, DDIMER, BNP, PROCALCITON, SARSCOV2NAA in the last 168 hours.  Invalid input(s): LACTICACID   ------------------------------------------------------------------------------------------------------------------ No results for input(s): CHOL, HDL, LDLCALC, TRIG, CHOLHDL, LDLDIRECT in the last 72 hours.  Lab Results  Component Value Date   HGBA1C 5.0 11/02/2020   ------------------------------------------------------------------------------------------------------------------ No results for input(s): TSH, T4TOTAL, T3FREE, THYROIDAB in the last 72 hours.  Invalid input(s): FREET3  ------------------------------------------------------------------------------------------------------------------ No results for input(s): VITAMINB12, FOLATE, FERRITIN, TIBC, IRON, RETICCTPCT in the last 72 hours.  Coagulation profile No results for input(s): INR, PROTIME in the last 168 hours. No results for input(s): DDIMER in the last 72 hours.  Cardiac Enzymes No results for input(s): CKTOTAL, CKMB, CKMBINDEX, TROPONINI in the last 168 hours.  ------------------------------------------------------------------------------------------------------------------ No results found for: BNP   Antibiotics: Anti-infectives (From admission, onward)    Start     Dose/Rate Route Frequency Ordered Stop   11/13/20 1400  ceFAZolin (ANCEF) IVPB 1 g/50 mL premix        1 g 100 mL/hr over 30 Minutes Intravenous Every 8 hours 11/13/20 1153     11/11/20 1130  cefTRIAXone (ROCEPHIN) 1 g in sodium chloride 0.9 % 100 mL IVPB  Status:  Discontinued        1 g 200 mL/hr over 30 Minutes Intravenous Every 24 hours 11/11/20 1033 11/13/20 1153        Radiology Reports  DG Knee Left Port  Result Date: 11/12/2020 CLINICAL DATA:  Fall  2 weeks ago with knee dislocation, now with persistent anterior knee pain. EXAM: PORTABLE LEFT KNEE - 1-2 VIEW COMPARISON:  11/01/2020; left tibia and fibular radiographs-10/31/2020; left knee MRI-11/01/2020 FINDINGS: Redemonstrated minimally displaced fractures involving the head of the fibula as well as the medial aspect of the distal femur. Joint spaces are preserved. Suspected small knee joint effusion. No definite evidence of lipohemarthrosis. No radiopaque foreign body. IMPRESSION: Redemonstrated minimally displaced fractures involving the head of the fibula as well as the medial aspect of the distal femur. Electronically Signed   By: Simonne Come M.D.   On: 11/12/2020 11:49   DG Abd Portable 1V  Result Date: 11/12/2020 CLINICAL DATA:  Abdominal pain EXAM: PORTABLE ABDOMEN - 1 VIEW COMPARISON:  None. FINDINGS: The bowel gas pattern is normal. No radio-opaque calculi or other significant radiographic abnormality are seen. IMPRESSION: No evidence of bowel obstruction. Electronically Signed  By: Caprice Renshaw M.D.   On: 11/12/2020 18:24      DVT prophylaxis: SCDs  Code Status: Full code  Family Communication: No family at bedside   Consultants: Orthopedics  Procedures:     Objective    Physical Examination:   General-appears in no acute distress Heart-S1-S2, regular, no murmur auscultated Lungs-clear to auscultation bilaterally, no wheezing or crackles auscultated Abdomen-soft, nontender, no organomegaly Extremities-no edema in the lower extremities Neuro-alert, oriented x3, no focal deficit noted  Status is: Inpatient  Dispo: The patient is from: Home              Anticipated d/c is to: Skilled nursing facility versus home              Anticipated d/c date is: 11/15/2020              Patient currently not stable for discharge  Barrier to discharge-unsafe discharge plan  COVID-19 Labs  No results for input(s): DDIMER, FERRITIN, LDH, CRP in the last 72 hours.  Lab  Results  Component Value Date   SARSCOV2NAA NEGATIVE 11/01/2020    Microbiology  Recent Results (from the past 240 hour(s))  Urine Culture     Status: Abnormal   Collection Time: 11/10/20  4:47 PM   Specimen: Urine, Catheterized  Result Value Ref Range Status   Specimen Description   Final    URINE, CATHETERIZED Performed at [facility], [address], [place], [place] [address]    Special Requests   Final    NONE Performed at [facility], [address] [place], [place] [address]    Culture >=100,000 COLONIES/mL PROTEUS MIRABILIS (A)  Final   Report Status 11/13/2020 FINAL  Final   Organism ID, Bacteria PROTEUS MIRABILIS (A)  Final      Susceptibility   Proteus mirabilis - MIC*    AMPICILLIN <=2 SENSITIVE Sensitive     CEFAZOLIN <=4 SENSITIVE Sensitive     CEFEPIME <=0.12 SENSITIVE Sensitive     CEFTRIAXONE <=0.25 SENSITIVE Sensitive     CIPROFLOXACIN <=0.25 SENSITIVE Sensitive     GENTAMICIN <=1 SENSITIVE Sensitive     IMIPENEM 2 SENSITIVE Sensitive     NITROFURANTOIN 128 RESISTANT Resistant     TRIMETH/SULFA <=20 SENSITIVE Sensitive     AMPICILLIN/SULBACTAM <=2 SENSITIVE Sensitive     PIP/TAZO <=4 SENSITIVE Sensitive     * >=100,000 COLONIES/mL PROTEUS MIRABILIS         Byren Pankow S Jarelle Ates   [facility] If 7PM-7AM, please contact night-coverage at [email or web address], Office  [phone]   11/13/2020, 3:57 PM  LOS: 12 days

## 2020-11-13 NOTE — Progress Notes (Signed)
PT Cancellation Note  Patient Details Name: Carla Little MRN: [id number] DOB: Sep 02, 1990   Cancelled Treatment:    Reason Eval/Treat Not Completed: Other (comment). Pt declined any activity d/t nausea, not feeling well   [facility] 11/13/2020, 1:00 PM

## 2020-11-13 NOTE — Progress Notes (Signed)
Per MD order, RN attempted to disimpact stool from patient. RN lubricated the rectum and attempted to remove stool, but none was palpated. Patient tolerated well.

## 2020-11-14 ENCOUNTER — Inpatient Hospital Stay (HOSPITAL_COMMUNITY): Payer: Medicaid Other

## 2020-11-14 ENCOUNTER — Encounter (HOSPITAL_COMMUNITY): Payer: Self-pay | Admitting: Orthopaedic Surgery

## 2020-11-14 DIAGNOSIS — K5900 Constipation, unspecified: Secondary | ICD-10-CM

## 2020-11-14 DIAGNOSIS — R1013 Epigastric pain: Secondary | ICD-10-CM

## 2020-11-14 LAB — HEPATIC FUNCTION PANEL
ALT: 31 U/L (ref 0–44)
AST: 51 U/L — ABNORMAL HIGH (ref 15–41)
Albumin: 3 g/dL — ABNORMAL LOW (ref 3.5–5.0)
Alkaline Phosphatase: 58 U/L (ref 38–126)
Bilirubin, Direct: 0.2 mg/dL (ref 0.0–0.2)
Indirect Bilirubin: 0.9 mg/dL (ref 0.3–0.9)
Total Bilirubin: 1.1 mg/dL (ref 0.3–1.2)
Total Protein: 7.1 g/dL (ref 6.5–8.1)

## 2020-11-14 LAB — LIPASE, BLOOD: Lipase: 19 U/L (ref 11–51)

## 2020-11-14 MED ORDER — MILK AND MOLASSES ENEMA
1.0000 | Freq: Once | RECTAL | Status: DC
  Filled 2020-11-14: qty 240

## 2020-11-14 MED ORDER — ACETAMINOPHEN 10 MG/ML IV SOLN
1000.0000 mg | Freq: Once | INTRAVENOUS | Status: AC
  Administered 2020-11-14: 1000 mg via INTRAVENOUS

## 2020-11-14 MED ORDER — SODIUM CHLORIDE 0.9 % IV SOLN: INTRAVENOUS | Status: DC

## 2020-11-14 MED ORDER — METOCLOPRAMIDE HCL 5 MG/ML IJ SOLN
10.0000 mg | Freq: Three times a day (TID) | INTRAMUSCULAR | Status: DC
  Administered 2020-11-14 – 2020-11-28 (×41): 10 mg via INTRAVENOUS
  Filled 2020-11-14 (×41): qty 2

## 2020-11-14 MED ORDER — ACETAMINOPHEN 10 MG/ML IV SOLN
1000.0000 mg | Freq: Once | INTRAVENOUS | Status: AC
  Administered 2020-11-15: 1000 mg via INTRAVENOUS
  Filled 2020-11-14: qty 100

## 2020-11-14 MED ORDER — SODIUM CHLORIDE 0.9 % IV BOLUS
1000.0000 mL | Freq: Once | INTRAVENOUS | Status: AC
  Administered 2020-11-14: 1000 mL via INTRAVENOUS

## 2020-11-14 MED ORDER — FAMOTIDINE IN NACL 20-0.9 MG/50ML-% IV SOLN
20.0000 mg | Freq: Two times a day (BID) | INTRAVENOUS | Status: AC
  Administered 2020-11-14 – 2020-11-17 (×7): 20 mg via INTRAVENOUS
  Filled 2020-11-14 (×8): qty 50

## 2020-11-14 MED ORDER — METOCLOPRAMIDE HCL 5 MG/ML IJ SOLN
10.0000 mg | Freq: Two times a day (BID) | INTRAMUSCULAR | Status: DC
  Administered 2020-11-14: 10 mg via INTRAVENOUS
  Filled 2020-11-14: qty 2

## 2020-11-14 NOTE — Progress Notes (Signed)
Pt had large amount of emesis after attempting to take her oxycodone with a few sips of ginger ale.  Pt was medicated per [facility] and chat done with Bruna Potter, NP regarding concerns for patient vomiting and unable to keep down fluids or meds.  Orders placed by NP and will continue to monitor.

## 2020-11-14 NOTE — Progress Notes (Signed)
Physical Therapy Treatment Patient Details Name: Carla Little MRN: [id number] DOB: 10-Feb-1991 Today's Date: 11/14/2020    History of Present Illness Carla Little is an 30 y.o.  who is morbidly obese, history of depression and got up to go the bathroom and felt a pop in her knee and had sudden instability and was unable to get up, EMS transported to the ER and found to have a knee dislocation which was reduced with fractures of the proximal fibula ligamentous avulsion laterally as well as medially off the medial femoral condyle and MRI showed complete ACL tear, PCL tear and MCL strain and has a large effusion and is going to be in knee immobilizer.  Vascular ultrasounds done and nondiagnostic. As of 8/13 she was placed in a L Bledsoe brace with WBAT orders and no flexion of knee.    PT Comments    Patient really working on goals of ambukation x 50', readjusting brace.  And tolerating mobility.  Patient ambulated x 8'.10',15,.  HR 129, BP 125/93.  Patient had no dizziness nor nausea.  Patient encouraged with progress.  Continue PT as much as possible to work towards  goals for safe DC home.  Patient lives in a 1 level apt w/ no steps.     Follow Up Recommendations  SNF;Home health PT     Equipment Recommendations  Rolling walker with 5" wheels;3in1 (PT);Wheelchair (measurements PT);Wheelchair cushion (measurements PT)    Recommendations for Other Services       Precautions / Restrictions Precautions Precaution Comments: NO FLEXION LEFT KNEE, BLEDSOE BRACE AT ALL TIMES or black KI Knee Immobilizer - Left: On at all times    Mobility  Bed Mobility               General bed mobility comments: in recliner    Transfers   Equipment used: Rolling walker (2 wheeled) Transfers: Sit to/from Stand Sit to Stand: Min guard         General transfer comment: sttod from recliner x 3  Ambulation/Gait Ambulation/Gait assistance: Min guard;+2 safety/equipment Gait  Distance (Feet): 8 Feet (then 10, tnen 15')   Gait Pattern/deviations: Step-to pattern;Wide base of support Gait velocity: decr   General Gait Details: patient tolerated ambulation, no nausea or dizziness.   Stairs             Wheelchair Mobility    Modified Rankin (Stroke Patients Only)       Balance     Sitting balance-Leahy Scale: Good     Standing balance support: Bilateral upper extremity supported Standing balance-Leahy Scale: Fair Standing balance comment: briefly able to  maintain static stand without support                            Cognition Arousal/Alertness: Awake/alert Behavior During Therapy: WFL for tasks assessed/performed Overall Cognitive Status: Within Functional Limits for tasks assessed                                 General Comments: Patient expresses desire to push herself and make gains in ambulation      Exercises      General Comments        Pertinent Vitals/Pain Pain Assessment: Faces Pain Score: 3  Pain Location: L knee when attempts to stand. states not as bad as has been Pain Intervention(s): Monitored during session    Home  Living                      Prior Function            PT Goals (current goals can now be found in the care plan section) Progress towards PT goals: Progressing toward goals    Frequency    Min 3X/week      PT Plan Current plan remains appropriate;Other (comment)    Co-evaluation              AM-PAC PT "6 Clicks" Mobility   Outcome Measure  Help needed turning from your back to your side while in a flat bed without using bedrails?: A Little Help needed moving from lying on your back to sitting on the side of a flat bed without using bedrails?: A Little Help needed moving to and from a bed to a chair (including a wheelchair)?: A Little Help needed standing up from a chair using your arms (e.g., wheelchair or bedside chair)?: A Little Help  needed to walk in hospital room?: A Little Help needed climbing 3-5 steps with a railing? : Total 6 Click Score: 16    End of Session   Activity Tolerance: Patient tolerated treatment well Patient left: in chair;with call bell/phone within reach Nurse Communication: Mobility status PT Visit Diagnosis: Unsteadiness on feet (R26.81);History of falling (Z91.81);Pain Pain - Right/Left: Left Pain - part of body: Knee     Time: 1600-1650 PT Time Calculation (min) (ACUTE ONLY): 50 min  Charges:  $Gait Training: 38-52 mins                     Blanchard Kelch PT Acute Rehabilitation Services Pager [phone] Office [phone]    Rada Hay 11/14/2020, 5:31 PM

## 2020-11-14 NOTE — Progress Notes (Addendum)
[facility]  PROGRESS NOTE  Carla Little PFX:[id number] DOB: March 07, 1991 DOA: 10/31/2020 PCP: Patient, No Pcp Per (Inactive)   Brief HPI:   30 year old female with history of morbid obesity, anxiety/depression, who felt a pop in the left knee when ambulating and suddenly experience instability with resultant mechanical fall.  EMS was called and she was found to have dislocated her knee.  This was recently reduced in the ED, MRI revealed a complete ACL tear, PCL tear and MCL strain as well as large effusion.  Orthopedic surgery was consulted and is managing with brace.  No plan for surgery.  Therapy recommended skilled nursing facility.    Subjective   Vomiting this morning, KUB was unremarkable. Had liquid stool last night.    Assessment/Plan:     Left knee dislocation -ACL and PCL tear/avulsion fracture involving the head of fibula/patellar dislocation/MCL sprain -Orthopedics consulted -Bledsoe hinged knee brace with serial exams to ensure that knee remains in place -Pain controlled with oral pain medications and bowel regimen  Intractable nausea/vomiting -start pepcid 20 mg iv q 12 hr -increase reglan to 10 mg iv q 8 hr -Lipase is 19, AST, 51, ALT 31 -If no improvement consider abdominal ultrasound  UTI -Complain of dysuria, UA was abnormal.   -Urine culture grew Proteus mirabilis, sensitive to ceftriaxone  -She was empirically started on ceftriaxone  - continue IV Ancef today.    Persistent sinus tachycardia -D-dimer was elevated; CTA chest was negative for PE -Continue low-dose metoprolol 12.5 mg p.o. twice daily  Acute urinary retention -Resolved -Continue Urecholine  Acute hypoxemic respiratory failure -Concern for undiagnosed OHS/OSA -Will need outpatient sleep study  Iron-deficiency anemia -Ferritin low at 11, iron saturation 6% -Received IV iron on 8/12 -hold  p.o. iron supplementation for  nausea/vomiting.  Hyponatremia -Resolved  Thrombocytosis -Improved -Likely reactive  Euthyroid sick syndrome -TSH 11.895, T4 0.99 -Check TSH in 4 to 6 weeks as outpatient  Constipation/fecal impaction -Started having liquid stools now -She did get milk of molasses enema yesterday -She also got milk of magnesia  30 cc p.o. x1 -Continue Senokot S tablet twice a day, MiraLAX 17 g p.o. twice daily     Scheduled medications:    acetaminophen  1,000 mg Oral TID   cholecalciferol  2,000 Units Oral Daily   enoxaparin (LOVENOX) injection  0.5 mg/kg Subcutaneous Q24H   mouth rinse  15 mL Mouth Rinse BID   metoCLOPramide (REGLAN) injection  10 mg Intravenous Q8H   metoprolol tartrate  12.5 mg Oral BID   milk and molasses  1 enema Rectal Once   polyethylene glycol  17 g Oral BID   senna-docusate  2 tablet Oral BID         Data Reviewed:   CBG:  No results for input(s): GLUCAP in the last 168 hours.  SpO2: 100 % O2 Flow Rate (L/min): 2 L/min    Vitals:   11/13/20 0551 11/13/20 1336 11/13/20 2014 11/14/20 0500  BP: 106/72 122/73 (!) 104/92 113/67  Pulse: 94 (!) 108 96 100  Resp: 16 18 17 16   Temp: 98.2 F (36.8 C) 98.6 F (37 C) 98.4 F (36.9 C) 98.8 F (37.1 C)  TempSrc: Oral Oral Oral Oral  SpO2: 94% 92% 100% 100%  Weight:      Height:         Intake/Output Summary (Last 24 hours) at 11/14/2020 1018 Last data filed at 11/14/2020 0344 Gross per 24 hour  Intake 197.41 ml  Output 300 ml  Net -102.59 ml    08/20 1901 - 08/22 0700 In: 197.4  Out: 1100 [Urine:1100]  Filed Weights   11/01/20 0026  Weight: (!) 181.4 kg    CBC:  Recent Labs  Lab 11/11/20 0332  WBC 8.6  HGB 8.2*  HCT 27.8*  PLT 300  MCV 87.7  MCH 25.9*  MCHC 29.5*  RDW 25.0*    Complete metabolic panel:  Recent Labs  Lab 11/07/20 1247 11/11/20 0332  NA  --  135  K  --  3.5  CL  --  100  CO2  --  26  GLUCOSE  --  94  BUN  --  8  CREATININE  --  0.59  CALCIUM  --   8.5*  TSH 11.895*  --     No results for input(s): LIPASE, AMYLASE in the last 168 hours.  No results for input(s): CRP, DDIMER, BNP, PROCALCITON, SARSCOV2NAA in the last 168 hours.  Invalid input(s): LACTICACID   ------------------------------------------------------------------------------------------------------------------ No results for input(s): CHOL, HDL, LDLCALC, TRIG, CHOLHDL, LDLDIRECT in the last 72 hours.  Lab Results  Component Value Date   HGBA1C 5.0 11/02/2020   ------------------------------------------------------------------------------------------------------------------ No results for input(s): TSH, T4TOTAL, T3FREE, THYROIDAB in the last 72 hours.  Invalid input(s): FREET3  ------------------------------------------------------------------------------------------------------------------ No results for input(s): VITAMINB12, FOLATE, FERRITIN, TIBC, IRON, RETICCTPCT in the last 72 hours.  Coagulation profile No results for input(s): INR, PROTIME in the last 168 hours. No results for input(s): DDIMER in the last 72 hours.  Cardiac Enzymes No results for input(s): CKTOTAL, CKMB, CKMBINDEX, TROPONINI in the last 168 hours.  ------------------------------------------------------------------------------------------------------------------ No results found for: BNP   Antibiotics: Anti-infectives (From admission, onward)    Start     Dose/Rate Route Frequency Ordered Stop   11/13/20 1400  ceFAZolin (ANCEF) IVPB 1 g/50 mL premix        1 g 100 mL/hr over 30 Minutes Intravenous Every 8 hours 11/13/20 1153     11/11/20 1130  cefTRIAXone (ROCEPHIN) 1 g in sodium chloride 0.9 % 100 mL IVPB  Status:  Discontinued        1 g 200 mL/hr over 30 Minutes Intravenous Every 24 hours 11/11/20 1033 11/13/20 1153        Radiology Reports  DG Abd 1 View  Result Date: 11/14/2020 CLINICAL DATA:  Constipation. EXAM: ABDOMEN - 1 VIEW COMPARISON:  Abdominal radiograph dated  11/12/2020. FINDINGS: Evaluation is very limited due to body habitus. No bowel dilatation. No significant stool burden. The osseous structures are intact. IMPRESSION: Negative. Electronically Signed   By: Elgie Collard M.D.   On: 11/14/2020 03:30   DG Abd Portable 1V  Result Date: 11/12/2020 CLINICAL DATA:  Abdominal pain EXAM: PORTABLE ABDOMEN - 1 VIEW COMPARISON:  None. FINDINGS: The bowel gas pattern is normal. No radio-opaque calculi or other significant radiographic abnormality are seen. IMPRESSION: No evidence of bowel obstruction. Electronically Signed   By: Caprice Renshaw M.D.   On: 11/12/2020 18:24      DVT prophylaxis: SCDs  Code Status: Full code  Family Communication: No family at bedside   Consultants: Orthopedics  Procedures:     Objective    Physical Examination:   General-appears in no acute distress Heart-S1-S2, regular, no murmur auscultated Lungs-clear to auscultation bilaterally, no wheezing or crackles auscultated Abdomen-soft, nontender, no organomegaly Extremities-no edema in the lower extremities Neuro-alert, oriented x3, no focal deficit noted  Status is: Inpatient  Dispo: The patient is from: Home  Anticipated d/c is to: Skilled nursing facility versus home              Anticipated d/c date is: 11/15/2020              Patient currently not stable for discharge  Barrier to discharge-unsafe discharge plan  COVID-19 Labs  No results for input(s): DDIMER, FERRITIN, LDH, CRP in the last 72 hours.  Lab Results  Component Value Date   SARSCOV2NAA NEGATIVE 11/01/2020    Microbiology  Recent Results (from the past 240 hour(s))  Urine Culture     Status: Abnormal   Collection Time: 11/10/20  4:47 PM   Specimen: Urine, Catheterized  Result Value Ref Range Status   Specimen Description   Final    URINE, CATHETERIZED Performed at [facility], [address], [place], [place] [address]    Special Requests    Final    NONE Performed at [facility], [address], [place], [place] [address]    Culture >=100,000 COLONIES/mL PROTEUS MIRABILIS (A)  Final   Report Status 11/13/2020 FINAL  Final   Organism ID, Bacteria PROTEUS MIRABILIS (A)  Final      Susceptibility   Proteus mirabilis - MIC*    AMPICILLIN <=2 SENSITIVE Sensitive     CEFAZOLIN <=4 SENSITIVE Sensitive     CEFEPIME <=0.12 SENSITIVE Sensitive     CEFTRIAXONE <=0.25 SENSITIVE Sensitive     CIPROFLOXACIN <=0.25 SENSITIVE Sensitive     GENTAMICIN <=1 SENSITIVE Sensitive     IMIPENEM 2 SENSITIVE Sensitive     NITROFURANTOIN 128 RESISTANT Resistant     TRIMETH/SULFA <=20 SENSITIVE Sensitive     AMPICILLIN/SULBACTAM <=2 SENSITIVE Sensitive     PIP/TAZO <=4 SENSITIVE Sensitive     * >=100,000 COLONIES/mL PROTEUS MIRABILIS         Gunter Conde S Dorine Duffey   [facility] If 7PM-7AM, please contact night-coverage at [email or web address], Office  [phone]   11/14/2020, 10:18 AM  LOS: 13 days

## 2020-11-15 NOTE — Progress Notes (Addendum)
[facility]  PROGRESS NOTE  Carla WIN GBT:[id number] DOB: 1990/12/02 DOA: 10/31/2020 PCP: Patient, No Pcp Per (Inactive)   Brief HPI:   30 year old female with history of morbid obesity, anxiety/depression, who felt a pop in the left knee when ambulating and suddenly experience instability with resultant mechanical fall.  EMS was called and she was found to have dislocated her knee.  This was recently reduced in the ED, MRI revealed a complete ACL tear, PCL tear and MCL strain as well as large effusion.  Orthopedic surgery was consulted and is managing with brace.  No plan for surgery.  Therapy recommended skilled nursing facility.    Subjective   Patient seen and examined, feels much better after starting IV Pepcid, Reglan.  States that she was able to keep clear liquids down.  Denies vomiting this morning.   Assessment/Plan:     Left knee dislocation -ACL and PCL tear/avulsion fracture involving the head of fibula/patellar dislocation/MCL sprain -Orthopedics consulted -Bledsoe hinged knee brace with serial exams to ensure that knee remains in place -Pain controlled with oral pain medications and bowel regimen  Intractable nausea/vomiting -Improved after starting  pepcid 20 mg iv q 12 hr and reglan  10 mg iv q 8 hr -Lipase is 19, AST, 51, ALT 31 -KUB was unremarkable -If no improvement consider abdominal ultrasound  UTI -Complain of dysuria, UA was abnormal.   -Urine culture grew Proteus mirabilis, sensitive to ceftriaxone  -She was empirically started on ceftriaxone, switched to IV Ancef.   -We will stop antibiotics after 7 days of treatment.  Today is day #6  Persistent sinus tachycardia -D-dimer was elevated; CTA chest was negative for PE -Continue low-dose metoprolol 12.5 mg p.o. twice daily  Acute urinary retention -Resolved -Continue Urecholine  Acute hypoxemic respiratory failure -Concern for undiagnosed OHS/OSA -Will need outpatient sleep  study  Iron-deficiency anemia -Ferritin low at 11, iron saturation 6% -Received IV iron on 8/12 -hold  p.o. iron supplementation for nausea/vomiting.  Hyponatremia -Resolved  Thrombocytosis -Improved -Likely reactive  Euthyroid sick syndrome -TSH 11.895, T4 0.99 -Check TSH in 4 to 6 weeks as outpatient  Constipation/fecal impaction -Resolved -Started having liquid stools now -She did get milk of molasses enema yesterday -She also got milk of magnesia  30 cc p.o. x1 -Continue Senokot S tablet twice a day, MiraLAX 17 g p.o. twice daily     Scheduled medications:    acetaminophen  1,000 mg Oral TID   cholecalciferol  2,000 Units Oral Daily   enoxaparin (LOVENOX) injection  0.5 mg/kg Subcutaneous Q24H   mouth rinse  15 mL Mouth Rinse BID   metoCLOPramide (REGLAN) injection  10 mg Intravenous Q8H   metoprolol tartrate  12.5 mg Oral BID   milk and molasses  1 enema Rectal Once   polyethylene glycol  17 g Oral BID   senna-docusate  2 tablet Oral BID         Data Reviewed:   CBG:  No results for input(s): GLUCAP in the last 168 hours.  SpO2: 91 % O2 Flow Rate (L/min): 2 L/min    Vitals:   11/14/20 1312 11/14/20 2339 11/15/20 0600 11/15/20 1533  BP: 119/72 121/76 120/76 112/79  Pulse: (!) 105 (!) 107 (!) 108 93  Resp: 18 18 18 20   Temp: 97.9 F (36.6 C) 98.5 F (36.9 C) 98.5 F (36.9 C) 97.6 F (36.4 C)  TempSrc: Oral Oral  Oral  SpO2: 100% 99% 98% 91%  Weight:  Height:         Intake/Output Summary (Last 24 hours) at 11/15/2020 1733 Last data filed at 11/15/2020 0600 Gross per 24 hour  Intake 1801.7 ml  Output 75 ml  Net 1726.7 ml    08/21 1901 - 08/23 0700 In: 1949 [I.V.:1451.7] Out: 375 [Urine:300]  Filed Weights   11/01/20 0026  Weight: (!) 181.4 kg    CBC:  Recent Labs  Lab 11/11/20 0332  WBC 8.6  HGB 8.2*  HCT 27.8*  PLT 300  MCV 87.7  MCH 25.9*  MCHC 29.5*  RDW 25.0*    Complete metabolic panel:  Recent Labs   Lab 11/11/20 0332 11/14/20 1217  NA 135  --   K 3.5  --   CL 100  --   CO2 26  --   GLUCOSE 94  --   BUN 8  --   CREATININE 0.59  --   CALCIUM 8.5*  --   AST  --  51*  ALT  --  31  ALKPHOS  --  58  BILITOT  --  1.1  ALBUMIN  --  3.0*    Recent Labs  Lab 11/14/20 1217  LIPASE 19    No results for input(s): CRP, DDIMER, BNP, PROCALCITON, SARSCOV2NAA in the last 168 hours.  Invalid input(s): LACTICACID   ------------------------------------------------------------------------------------------------------------------ No results for input(s): CHOL, HDL, LDLCALC, TRIG, CHOLHDL, LDLDIRECT in the last 72 hours.  Lab Results  Component Value Date   HGBA1C 5.0 11/02/2020   ------------------------------------------------------------------------------------------------------------------ No results for input(s): TSH, T4TOTAL, T3FREE, THYROIDAB in the last 72 hours.  Invalid input(s): FREET3  ------------------------------------------------------------------------------------------------------------------ No results for input(s): VITAMINB12, FOLATE, FERRITIN, TIBC, IRON, RETICCTPCT in the last 72 hours.  Coagulation profile No results for input(s): INR, PROTIME in the last 168 hours. No results for input(s): DDIMER in the last 72 hours.  Cardiac Enzymes No results for input(s): CKTOTAL, CKMB, CKMBINDEX, TROPONINI in the last 168 hours.  ------------------------------------------------------------------------------------------------------------------ No results found for: BNP   Antibiotics: Anti-infectives (From admission, onward)    Start     Dose/Rate Route Frequency Ordered Stop   11/13/20 1400  ceFAZolin (ANCEF) IVPB 1 g/50 mL premix        1 g 100 mL/hr over 30 Minutes Intravenous Every 8 hours 11/13/20 1153 11/17/20 2359   11/11/20 1130  cefTRIAXone (ROCEPHIN) 1 g in sodium chloride 0.9 % 100 mL IVPB  Status:  Discontinued        1 g 200 mL/hr over 30 Minutes  Intravenous Every 24 hours 11/11/20 1033 11/13/20 1153        Radiology Reports  DG Abd 1 View  Result Date: 11/14/2020 CLINICAL DATA:  Constipation. EXAM: ABDOMEN - 1 VIEW COMPARISON:  Abdominal radiograph dated 11/12/2020. FINDINGS: Evaluation is very limited due to body habitus. No bowel dilatation. No significant stool burden. The osseous structures are intact. IMPRESSION: Negative. Electronically Signed   By: Elgie Collard M.D.   On: 11/14/2020 03:30      DVT prophylaxis: SCDs  Code Status: Full code  Family Communication: No family at bedside   Consultants: Orthopedics  Procedures:     Objective    Physical Examination:   General-appears in no acute distress Heart-S1-S2, regular, no murmur auscultated Lungs-clear to auscultation bilaterally, no wheezing or crackles auscultated Abdomen-soft, nontender, no organomegaly Extremities-no edema in the lower extremities Neuro-alert, oriented x3, no focal deficit noted  Status is: Inpatient  Dispo: The patient is from: Home  Anticipated d/c is to: Skilled nursing facility versus home              Anticipated d/c date is: 11/17/2020              Patient currently not stable for discharge  Barrier to discharge-unsafe discharge plan  COVID-19 Labs  No results for input(s): DDIMER, FERRITIN, LDH, CRP in the last 72 hours.  Lab Results  Component Value Date   SARSCOV2NAA NEGATIVE 11/01/2020    Microbiology  Recent Results (from the past 240 hour(s))  Urine Culture     Status: Abnormal   Collection Time: 11/10/20  4:47 PM   Specimen: Urine, Catheterized  Result Value Ref Range Status   Specimen Description   Final    URINE, CATHETERIZED Performed at [facility], [address] [place], [place] [address]    Special Requests   Final    NONE Performed at [facility], [address], [place], [place] [address]    Culture >=100,000 COLONIES/mL PROTEUS  MIRABILIS (A)  Final   Report Status 11/13/2020 FINAL  Final   Organism ID, Bacteria PROTEUS MIRABILIS (A)  Final      Susceptibility   Proteus mirabilis - MIC*    AMPICILLIN <=2 SENSITIVE Sensitive     CEFAZOLIN <=4 SENSITIVE Sensitive     CEFEPIME <=0.12 SENSITIVE Sensitive     CEFTRIAXONE <=0.25 SENSITIVE Sensitive     CIPROFLOXACIN <=0.25 SENSITIVE Sensitive     GENTAMICIN <=1 SENSITIVE Sensitive     IMIPENEM 2 SENSITIVE Sensitive     NITROFURANTOIN 128 RESISTANT Resistant     TRIMETH/SULFA <=20 SENSITIVE Sensitive     AMPICILLIN/SULBACTAM <=2 SENSITIVE Sensitive     PIP/TAZO <=4 SENSITIVE Sensitive     * >=100,000 COLONIES/mL PROTEUS MIRABILIS         Roshaun Pound S Jesenya Bowditch   [facility] If 7PM-7AM, please contact night-coverage at [email or web address], Office  [phone]   11/15/2020, 5:33 PM  LOS: 14 days

## 2020-11-15 NOTE — Progress Notes (Signed)
Physical Therapy Treatment Patient Details Name: Carla Little MRN: [id number] DOB: 02-25-1991 Today's Date: 11/15/2020    History of Present Illness Carla Little is an 30 y.o.  who is morbidly obese, history of depression and got up to go the bathroom and felt a pop in her knee and had sudden instability and was unable to get up, EMS transported to the ER and found to have a knee dislocation which was reduced with fractures of the proximal fibula ligamentous avulsion laterally as well as medially off the medial femoral condyle and MRI showed complete ACL tear, PCL tear and MCL strain and has a large effusion and is going to be in knee immobilizer.  Vascular ultrasounds done and nondiagnostic. As of 8/13 she was placed in a L Bledsoe brace with WBAT orders and no flexion of knee.    PT Comments    Carla Little is very cheerful, making progress with mobility. Ambulated 10', 36', 32'. Max HR 120 after 3rd  walk.  Readjusted KI which still slides down. Will try the bledsoe again tomorrow.  Working diligently  with PT to improve enough to go home.   Follow Up Recommendations  Home health PT     Equipment Recommendations  Rolling walker with 5" wheels;3in1 (PT);Wheelchair (measurements PT);Wheelchair cushion (measurements PT)    Recommendations for Other Services       Precautions / Restrictions Precautions Precautions: Fall Precaution Comments: NO FLEXION LEFT KNEE, BLEDSOE BRACE AT ALL TIMES or black KI Required Braces or Orthoses: Other Brace Knee Immobilizer - Right: On at all times Other Brace: ok to loosen  brace when resting. Per Dr. Lanier Clam, OK to use  KI vs [facility] . Have modified the KI and added 2 metal stays across the antherior knee(from another  KI.) Restrictions LLE Weight Bearing: Partial weight bearing Other Position/Activity Restrictions: PT orders state attempt WBAT with L Bledsoe brace with no flexion of L knee    Mobility  Bed Mobility   Bed Mobility:  Supine to Sit     Supine to sit: Supervision     General bed mobility comments: no assistnace, HOB raised    Transfers   Equipment used: Rolling walker (2 wheeled) Transfers: Sit to/from Stand Sit to Stand: Min guard         General transfer comment: stood from recliner x 3  Ambulation/Gait Ambulation/Gait assistance: Min guard;+2 safety/equipment Gait Distance (Feet): 10 Feet (36' then 32') Assistive device: Rolling walker (2 wheeled) Gait Pattern/deviations: Step-to pattern;Wide base of support Gait velocity: decr   General Gait Details: patient tolerated ambulation, no nausea or dizziness.   Stairs             Wheelchair Mobility    Modified Rankin (Stroke Patients Only)       Balance     Sitting balance-Leahy Scale: Good     Standing balance support: Bilateral upper extremity supported Standing balance-Leahy Scale: Fair Standing balance comment: briefly able to  maintain static stand without support                            Cognition Arousal/Alertness: Awake/alert Behavior During Therapy: WFL for tasks assessed/performed Overall Cognitive Status: Within Functional Limits for tasks assessed                                 General Comments: very cheerful, has had BM, pleased with  progress.      Exercises      General Comments        Pertinent Vitals/Pain Faces Pain Scale: Hurts a little bit Pain Location: L knee when attempts to stand. states not as bad as has been Pain Descriptors / Indicators: Discomfort Pain Intervention(s): Monitored during session    Home Living                      Prior Function            PT Goals (current goals can now be found in the care plan section)      Frequency    Min 3X/week      PT Plan Current plan remains appropriate;Other (comment)    Co-evaluation              AM-PAC PT "6 Clicks" Mobility   Outcome Measure  Help needed turning from  your back to your side while in a flat bed without using bedrails?: A Little Help needed moving from lying on your back to sitting on the side of a flat bed without using bedrails?: A Little Help needed moving to and from a bed to a chair (including a wheelchair)?: A Little Help needed standing up from a chair using your arms (e.g., wheelchair or bedside chair)?: A Little Help needed to walk in hospital room?: A Little Help needed climbing 3-5 steps with a railing? : Total 6 Click Score: 16    End of Session   Activity Tolerance: Patient tolerated treatment well Patient left: in chair;with call bell/phone within reach Nurse Communication: Mobility status PT Visit Diagnosis: Unsteadiness on feet (R26.81);History of falling (Z91.81);Pain Pain - Right/Left: Left Pain - part of body: Knee     Time: 1600-1640 PT Time Calculation (min) (ACUTE ONLY): 40 min  Charges:  $Gait Training: 23-37 mins $Self Care/Home Management: 8-22                     Blanchard Kelch PT Acute Rehabilitation Services Pager [phone] Office [phone]    Rada Hay 11/15/2020, 5:30 PM

## 2020-11-16 DIAGNOSIS — T148XXA Other injury of unspecified body region, initial encounter: Secondary | ICD-10-CM

## 2020-11-16 NOTE — Progress Notes (Signed)
   11/16/20 0850  Assess: MEWS Score  Level of Consciousness Alert  Assess: MEWS Score  MEWS Temp 0  MEWS Systolic 0  MEWS Pulse 2  MEWS RR 0  MEWS LOC 0  MEWS Score 2  MEWS Score Color Yellow  Assess: if the MEWS score is Yellow or Red  Were vital signs taken at a resting state? Yes  Focused Assessment No change from prior assessment  Does the patient meet 2 or more of the SIRS criteria? No  Does the patient have a confirmed or suspected source of infection? No  Provider and Rapid Response Notified? Yes  MEWS guidelines implemented *See Row Information* Yes  Treat  MEWS Interventions Administered prn meds/treatments  Notify: Provider  Provider Name/Title Rickey Barbara  MD  Date Provider Notified 11/16/20  Time Provider Notified [phone]  Notification Type Page  Notification Reason Change in status  Document  Patient Outcome Other (Comment) (continue monitoring, no new orders at this time)

## 2020-11-16 NOTE — Progress Notes (Signed)
PROGRESS NOTE    Carla Little  ZPH:[id number] DOB: 06-30-1990 DOA: 10/31/2020 PCP: Patient, No Pcp Per (Inactive)    Brief Narrative:  30 year old female with history of morbid obesity, anxiety/depression, who felt a pop in the left knee when ambulating and suddenly experience instability with resultant mechanical fall.  EMS was called and she was found to have dislocated her knee.  This was recently reduced in the ED, MRI revealed a complete ACL tear, PCL tear and MCL strain as well as large effusion.  Orthopedic surgery was consulted and is managing with brace.  No plan for surgery.  Therapy recommended skilled nursing facility.  Assessment & Plan:   Active Problems:   Left knee dislocation   Knee dislocation, left, initial encounter   Left knee dislocation -ACL and PCL tear/avulsion fracture involving the head of fibula/patellar dislocation/MCL sprain -Orthopedics were consulted -Bledsoe hinged knee brace with serial exams to ensure that knee remains in place -Continue with analgesia as tolerated   Intractable nausea/vomiting -Improved after starting  pepcid 20 mg iv q 12 hr and reglan  10 mg iv q 8 hr -Lipase is 19, AST, 51, ALT 31 -KUB was noted to be unremarkable   UTI -Complain of dysuria, UA was abnormal.   -Urine culture grew Proteus mirabilis, sensitive to ceftriaxone  -She was empirically started on ceftriaxone, switched to IV Ancef.   -Pt to complete 7 days of treatment today   Persistent sinus tachycardia -D-dimer was elevated; CTA chest was negative for PE -Continue low-dose metoprolol 12.5 mg p.o. twice daily   Acute urinary retention -Resolved -Continue Urecholine   Acute hypoxemic respiratory failure -Concern for undiagnosed OHS/OSA -Will need outpatient sleep study   Iron-deficiency anemia -Ferritin low at 11, iron saturation 6% -Received IV iron on 8/12 -hold  p.o. iron supplementation for nausea/vomiting.   Hyponatremia -Resolved    Thrombocytosis -Improved -Likely reactive   Euthyroid sick syndrome -TSH 11.895, T4 0.99 -Check TSH in 4 to 6 weeks as outpatient   Constipation/fecal impaction -Resolved -Started having liquid stools now -She did get milk of molasses enema yesterday -She also got milk of magnesia  30 cc p.o. x1 -Continue Senokot S tablet twice a day, MiraLAX 17 g p.o. twice daily    DVT prophylaxis: Lovenox subq Code Status: Full Family Communication: Pt in room, family at bedside  Status is: Inpatient  Remains inpatient appropriate because:Inpatient level of care appropriate due to severity of illness  Dispo: The patient is from: Home              Anticipated d/c is to: Home              Patient currently is not medically stable to d/c.   Difficult to place patient No  Consultants:  Orthopedics  Procedures:    Antimicrobials: Anti-infectives (From admission, onward)    Start     Dose/Rate Route Frequency Ordered Stop   11/13/20 1400  ceFAZolin (ANCEF) IVPB 1 g/50 mL premix        1 g 100 mL/hr over 30 Minutes Intravenous Every 8 hours 11/13/20 1153 11/17/20 2359   11/11/20 1130  cefTRIAXone (ROCEPHIN) 1 g in sodium chloride 0.9 % 100 mL IVPB  Status:  Discontinued        1 g 200 mL/hr over 30 Minutes Intravenous Every 24 hours 11/11/20 1033 11/13/20 1153       Subjective: Complaining of nausea  Objective: Vitals:   11/15/20 2032 11/16/20 0437 11/16/20 0839 11/16/20  1117  BP: 102/75 110/72 (!) 143/117 122/87  Pulse: 93 (!) 102 (!) 124 (!) 110  Resp: 18 20 18 20   Temp: 98 F (36.7 C) 97.8 F (36.6 C) 98.7 F (37.1 C) 98.5 F (36.9 C)  TempSrc: Oral  Oral Oral  SpO2: 100% 99% 92% 96%  Weight:      Height:        Intake/Output Summary (Last 24 hours) at 11/16/2020 1538 Last data filed at 11/16/2020 0559 Gross per 24 hour  Intake 1445.39 ml  Output --  Net 1445.39 ml   Filed Weights   11/01/20 0026  Weight: (!) 181.4 kg    Examination: General exam:  Awake, laying in bed, in nad Respiratory system: Normal respiratory effort, no wheezing Cardiovascular system: regular rate, s1, s2 Gastrointestinal system: Soft, nondistended, positive BS Central nervous system: CN2-12 grossly intact, strength intact Extremities: Perfused, no clubbing Skin: Normal skin turgor, no notable skin lesions seen Psychiatry: Mood normal // no visual hallucinations   Data Reviewed: I have personally reviewed following labs and imaging studies  CBC: Recent Labs  Lab 11/11/20 0332  WBC 8.6  HGB 8.2*  HCT 27.8*  MCV 87.7  PLT 300   Basic Metabolic Panel: Recent Labs  Lab 11/11/20 0332  NA 135  K 3.5  CL 100  CO2 26  GLUCOSE 94  BUN 8  CREATININE 0.59  CALCIUM 8.5*   GFR: Estimated Creatinine Clearance: 169.5 mL/min (by C-G formula based on SCr of 0.59 mg/dL). Liver Function Tests: Recent Labs  Lab 11/14/20 1217  AST 51*  ALT 31  ALKPHOS 58  BILITOT 1.1  PROT 7.1  ALBUMIN 3.0*   Recent Labs  Lab 11/14/20 1217  LIPASE 19   No results for input(s): AMMONIA in the last 168 hours. Coagulation Profile: No results for input(s): INR, PROTIME in the last 168 hours. Cardiac Enzymes: No results for input(s): CKTOTAL, CKMB, CKMBINDEX, TROPONINI in the last 168 hours. BNP (last 3 results) No results for input(s): PROBNP in the last 8760 hours. HbA1C: No results for input(s): HGBA1C in the last 72 hours. CBG: No results for input(s): GLUCAP in the last 168 hours. Lipid Profile: No results for input(s): CHOL, HDL, LDLCALC, TRIG, CHOLHDL, LDLDIRECT in the last 72 hours. Thyroid Function Tests: No results for input(s): TSH, T4TOTAL, FREET4, T3FREE, THYROIDAB in the last 72 hours. Anemia Panel: No results for input(s): VITAMINB12, FOLATE, FERRITIN, TIBC, IRON, RETICCTPCT in the last 72 hours. Sepsis Labs: No results for input(s): PROCALCITON, LATICACIDVEN in the last 168 hours.  Recent Results (from the past 240 hour(s))  Urine Culture      Status: Abnormal   Collection Time: 11/10/20  4:47 PM   Specimen: Urine, Catheterized  Result Value Ref Range Status   Specimen Description   Final    URINE, CATHETERIZED Performed at [facility], [address], [place], [place]    Special Requests   Final    NONE Performed at [facility], [address] [place], [place]    Culture >=100,000 COLONIES/mL PROTEUS MIRABILIS (A)  Final   Report Status 11/13/2020 FINAL  Final   Organism ID, Bacteria PROTEUS MIRABILIS (A)  Final      Susceptibility   Proteus mirabilis - MIC*    AMPICILLIN <=2 SENSITIVE Sensitive     CEFAZOLIN <=4 SENSITIVE Sensitive     CEFEPIME <=0.12 SENSITIVE Sensitive     CEFTRIAXONE <=0.25 SENSITIVE Sensitive     CIPROFLOXACIN <=  0.25 SENSITIVE Sensitive     GENTAMICIN <=1 SENSITIVE Sensitive     IMIPENEM 2 SENSITIVE Sensitive     NITROFURANTOIN 128 RESISTANT Resistant     TRIMETH/SULFA <=20 SENSITIVE Sensitive     AMPICILLIN/SULBACTAM <=2 SENSITIVE Sensitive     PIP/TAZO <=4 SENSITIVE Sensitive     * >=100,000 COLONIES/mL PROTEUS MIRABILIS     Radiology Studies: No results found.  Scheduled Meds:  acetaminophen  1,000 mg Oral TID   cholecalciferol  2,000 Units Oral Daily   enoxaparin (LOVENOX) injection  0.5 mg/kg Subcutaneous Q24H   mouth rinse  15 mL Mouth Rinse BID   metoCLOPramide (REGLAN) injection  10 mg Intravenous Q8H   metoprolol tartrate  12.5 mg Oral BID   milk and molasses  1 enema Rectal Once   polyethylene glycol  17 g Oral BID   senna-docusate  2 tablet Oral BID   Continuous Infusions:  sodium chloride 75 mL/hr at 11/16/20 0603    ceFAZolin (ANCEF) IV 1 g (11/16/20 1507)   famotidine (PEPCID) IV 20 mg (11/15/20 2200)   promethazine (PHENERGAN) injection (IM or IVPB) 12.5 mg (11/13/20 2114)     LOS: 15 days   Rickey Barbara, MD [facility] Pager On Amion  If 7PM-7AM, please contact night-coverage 11/16/2020, 3:38  PM

## 2020-11-16 NOTE — Progress Notes (Signed)
   11/16/20 0839  Assess: MEWS Score  Temp 98.7 F (37.1 C)  BP (!) 143/117  Pulse Rate (!) 124  Resp 18  Level of Consciousness Alert  SpO2 92 %  O2 Device Room Air  Assess: MEWS Score  MEWS Temp 0  MEWS Systolic 0  MEWS Pulse 2  MEWS RR 0  MEWS LOC 0  MEWS Score 2  MEWS Score Color Yellow  Treat  Pain Scale 0-10  Pain Score 7  Pain Type Acute pain  Pain Location Knee  Pain Intervention(s) MD notified (Comment);Refused;Environmental changes (refused medication)  Assess: SIRS CRITERIA  SIRS Temperature  0  SIRS Pulse 1  SIRS Respirations  0  SIRS WBC 0  SIRS Score Sum  1

## 2020-11-16 NOTE — Progress Notes (Signed)
Pt still nauseous. RN gave multiple medications, MD made aware at huddle. No new orders at this time. Pt refused oral medications until her nausea gets better. Pt refused pain medication. Call bell within reach and RN number on board.

## 2020-11-16 NOTE — Progress Notes (Addendum)
Occupational Therapy Treatment Patient Details Name: Carla Little MRN: [id number] DOB: 1990-05-01 Today's Date: 11/16/2020    History of present illness Carla Little is an 30 y.o.  who is morbidly obese, history of depression and got up to go the bathroom and felt a pop in her knee and had sudden instability and was unable to get up, EMS transported to the ER and found to have a knee dislocation which was reduced with fractures of the proximal fibula ligamentous avulsion laterally as well as medially off the medial femoral condyle and MRI showed complete ACL tear, PCL tear and MCL strain and has a large effusion and is going to be in knee immobilizer.  Vascular ultrasounds done and nondiagnostic. As of 8/13 she was placed in a L Bledsoe brace with WBAT orders and no flexion of knee.   OT comments  Patient was noted to be in better spirits today with motivation towards getting stronger. Patients goals were updated on this date with patient noted to have made good progress towards goals. Patient was noted to have improvement in toileting hygiene and transfers with RW on this date. Patient was educated on moving [facility] further each time to increase functional mobility. Patient verbalized understanding. Patients nurse educated on recommendations. Patients friend was present during session. Patient would continue to benefit from skilled OT services at this time while admitted and after d/c to address noted deficits in order to improve overall safety and independence in ADLs.    Follow Up Recommendations  Home health OT    Equipment Recommendations  Other (comment) (bariatric commode and walker tray)    Recommendations for Other Services      Precautions / Restrictions Precautions Precautions: Fall Precaution Comments: NO FLEXION LEFT KNEE, BLEDSOE BRACE AT ALL TIMES or black KI Knee Immobilizer - Right: On at all times Other Brace: ok to loosen  brace when resting. Per Dr. Lanier Clam, OK to  use  KI vs [facility] . Have modified the KI and added 2 metal stays across the antherior knee(from another  KI.) Restrictions Weight Bearing Restrictions: Yes LLE Weight Bearing: Partial weight bearing Other Position/Activity Restrictions: PT orders state attempt WBAT with L Bledsoe brace with no flexion of L knee       Mobility Bed Mobility               General bed mobility comments: in recliner at this time    Transfers Overall transfer level: Needs assistance Equipment used: Rolling walker (2 wheeled) Transfers: Sit to/from Stand Sit to Stand: Min guard              Balance           Standing balance support: During functional activity Standing balance-Leahy Scale: Fair Standing balance comment: patient was able to briefly maintain static standing without UE support for adjustment of hospital gown.                           ADL either performed or assessed with clinical judgement   ADL                 Lower Body Bathing Details (indicate cue type and reason): patient was able to demonstrate ability to particiapte in adjustement of KI seated in recliner with BLE elevated with min gaurd. patient was able to reach all straps and adjust them for comfort when at rest.  Toilet Transfer Details (indicate cue type and reason): patient completed transfer to and from [facility] with min guard with RW with adjustment of KI. Toileting- Clothing Manipulation and Hygiene: Sitting/lateral lean;With adaptive equipment;Min guard Toileting - Clothing Manipulation Details (indicate cue type and reason): patient was able to participate in hygiene tasks seated on BSC with wash cloths and toileting wand to increase independence in ADLs.       General ADL Comments: patient and I discussed moving BSC closer to bathroom during AM hours to increase mobility with RW. currently too tight of space to navigate into bathroom with BSC over toilet at this time.  patient  needs BSC to be able to maintain KI and to be able to participate in transfers.     Vision       Perception     Praxis      Cognition Arousal/Alertness: Awake/alert Behavior During Therapy: WFL for tasks assessed/performed Overall Cognitive Status: Within Functional Limits for tasks assessed                                 General Comments: patient is motivated to get back home and egar to participate.        Exercises     Shoulder Instructions       General Comments      Pertinent Vitals/ Pain       Pain Assessment: Faces Faces Pain Scale: Hurts a little bit Pain Location: L knee when attempts to stand. states not as bad as has been Pain Descriptors / Indicators: Discomfort Pain Intervention(s): Monitored during session  Home Living                                          Prior Functioning/Environment              Frequency  Min 2X/week        Progress Toward Goals  OT Goals(current goals can now be found in the care plan section)  Progress towards OT goals: Progressing toward goals  Acute Rehab OT Goals Patient Stated Goal: to get home soon Potential to Achieve Goals: Good ADL Goals Pt Will Perform Eating: with modified independence Pt Will Perform Grooming: with modified independence Pt Will Perform Upper Body Bathing: with supervision;with adaptive equipment;sitting Pt Will Perform Lower Body Dressing: with adaptive equipment;with max assist;sitting/lateral leans Pt Will Transfer to Toilet: with min guard assist;ambulating Pt Will Perform Toileting - Clothing Manipulation and hygiene: with min assist;sitting/lateral leans;with adaptive equipment Pt/caregiver will Perform Home Exercise Program: Increased ROM;Increased strength;With theraband;With Supervision  Plan Discharge plan needs to be updated    Co-evaluation                 AM-PAC OT "6 Clicks" Daily Activity     Outcome Measure   Help from  another person eating meals?: None Help from another person taking care of personal grooming?: A Little Help from another person toileting, which includes using toliet, bedpan, or urinal?: A Little Help from another person bathing (including washing, rinsing, drying)?: A Lot Help from another person to put on and taking off regular upper body clothing?: A Little Help from another person to put on and taking off regular lower body clothing?: A Little 6 Click Score: 18    End of Session Equipment Utilized During Treatment:  Rolling walker  OT Visit Diagnosis: Other abnormalities of gait and mobility (R26.89);History of falling (Z91.81);Pain Pain - Right/Left: Left Pain - part of body: Knee   Activity Tolerance Patient tolerated treatment well   Patient Left in chair;with call bell/phone within reach;with family/visitor present   Nurse Communication Mobility status        Time: 0175-1025 OT Time Calculation (min): 28 min  Charges: OT General Charges $OT Visit: 1 Visit OT Treatments $Self Care/Home Management : 23-37 mins  Sharyn Blitz OTR/L, MS Acute Rehabilitation Department Office# [phone] Pager# [phone]    Chalmers Guest Ruchy Wildrick 11/16/2020, 9:08 AM

## 2020-11-17 DIAGNOSIS — R1011 Right upper quadrant pain: Secondary | ICD-10-CM

## 2020-11-17 LAB — COMPREHENSIVE METABOLIC PANEL
ALT: 23 U/L (ref 0–44)
AST: 49 U/L — ABNORMAL HIGH (ref 15–41)
Albumin: 3 g/dL — ABNORMAL LOW (ref 3.5–5.0)
Alkaline Phosphatase: 60 U/L (ref 38–126)
Anion gap: 7 (ref 5–15)
BUN: <5 mg/dL — ABNORMAL LOW (ref 6–20)
CO2: 27 mmol/L (ref 22–32)
Calcium: 8.4 mg/dL — ABNORMAL LOW (ref 8.9–10.3)
Chloride: 103 mmol/L (ref 98–111)
Creatinine, Ser: 0.59 mg/dL (ref 0.44–1.00)
GFR, Estimated: >60 mL/min (ref >60)
Glucose, Bld: 91 mg/dL (ref 70–99)
Potassium: 3 mmol/L — ABNORMAL LOW (ref 3.5–5.1)
Sodium: 137 mmol/L (ref 135–145)
Total Bilirubin: 1.1 mg/dL (ref 0.3–1.2)
Total Protein: 7.2 g/dL (ref 6.5–8.1)

## 2020-11-17 MED ORDER — POTASSIUM CHLORIDE 10 MEQ/100ML IV SOLN
10.0000 meq | INTRAVENOUS | Status: AC
Start: 2020-11-17 — End: 2020-11-17
  Administered 2020-11-17 (×6): 10 meq via INTRAVENOUS
  Filled 2020-11-17 (×6): qty 100

## 2020-11-17 NOTE — Progress Notes (Signed)
Physical Therapy Treatment Patient Details Name: Carla Little MRN: [id number] DOB: 04-21-1990 Today's Date: 11/17/2020    History of Present Illness Carla Little is an 30 y.o.  who is morbidly obese, history of depression and got up to go the bathroom and felt a pop in her knee and had sudden instability and was unable to get up, EMS transported to the ER and found to have a knee dislocation which was reduced with fractures of the proximal fibula ligamentous avulsion laterally as well as medially off the medial femoral condyle and MRI showed complete ACL tear, PCL tear and MCL strain and has a large effusion and is going to be in knee immobilizer.  Vascular ultrasounds done and nondiagnostic. As of 8/13 she was placed in a L Bledsoe brace with WBAT orders and no flexion of knee.    PT Comments    Patient reports still difficulty keeping down food.  Patient now reporting left calf pain. Patient unable to ambulate >5' x 2 due to pain complaints. MD notified via  chat .  Continue PT for  ambulation and DC planning.   Follow Up Recommendations  Home health PT     Equipment Recommendations  Rolling walker with 5" wheels;3in1 (PT);Wheelchair (measurements PT);Wheelchair cushion (measurements PT)    Recommendations for Other Services       Precautions / Restrictions Precautions Precaution Comments: NO FLEXION LEFT KNEE, using KI right now, will try bledsoe again before she leaves, she can adjust  KI, bledsoe more difficult, Knee Immobilizer - Right: On at all times Other Brace: ok to loosen  brace when resting. Per Dr. Lanier Clam, OK to use  KI vs [facility] . Have modified the KI and added 2 metal stays across the anterior knee(from another  KI.) Restrictions LLE Weight Bearing: Weight bearing as tolerated LLE Partial Weight Bearing Percentage or Pounds: per dr Magnus Ivan    Mobility  Bed Mobility               General bed mobility comments: in recliner at this time     Transfers Overall transfer level: Needs assistance Equipment used: Rolling walker (2 wheeled) Transfers: Sit to/from Stand Sit to Stand: Min guard         General transfer comment: stood from recliner x 2  Ambulation/Gait Ambulation/Gait assistance: Min guard;+2 safety/equipment Gait Distance (Feet): 5 Feet (x 2) Assistive device: Rolling walker (2 wheeled) Gait Pattern/deviations: Step-to pattern;Wide base of support     General Gait Details: limited by left calf pain complaints   Stairs             Wheelchair Mobility    Modified Rankin (Stroke Patients Only)       Balance     Sitting balance-Leahy Scale: Good     Standing balance support: During functional activity Standing balance-Leahy Scale: Fair                              Cognition Arousal/Alertness: Awake/alert Behavior During Therapy: WFL for tasks assessed/performed Overall Cognitive Status: Within Functional Limits for tasks assessed                                        Exercises      General Comments        Pertinent Vitals/Pain Faces Pain Scale: Hurts whole lot Pain Location:  left calf, no pain in the  knee.(chatted Dr. Rhona Leavens.) Pain Descriptors / Indicators: Discomfort;Grimacing;Guarding;Moaning Pain Intervention(s): Monitored during session;Limited activity within patient's tolerance    Home Living                      Prior Function            PT Goals (current goals can now be found in the care plan section) Acute Rehab PT Goals Patient Stated Goal: to get home soon PT Goal Formulation: With patient/family Time For Goal Achievement: 12/01/20 Potential to Achieve Goals: Good Progress towards PT goals: Progressing toward goals    Frequency    Min 5X/week      PT Plan Current plan remains appropriate;Other (comment);Frequency needs to be updated    Co-evaluation              AM-PAC PT "6 Clicks" Mobility    Outcome Measure  Help needed turning from your back to your side while in a flat bed without using bedrails?: None Help needed moving from lying on your back to sitting on the side of a flat bed without using bedrails?: None Help needed moving to and from a bed to a chair (including a wheelchair)?: A Little Help needed standing up from a chair using your arms (e.g., wheelchair or bedside chair)?: A Little Help needed to walk in hospital room?: A Little Help needed climbing 3-5 steps with a railing? : Total 6 Click Score: 18    End of Session Equipment Utilized During Treatment: Gait belt Activity Tolerance: Patient limited by pain Patient left: in chair;with call bell/phone within reach Nurse Communication: Mobility status PT Visit Diagnosis: Unsteadiness on feet (R26.81);History of falling (Z91.81);Pain Pain - Right/Left: Left Pain - part of body: Leg     Time: 9509-3267 PT Time Calculation (min) (ACUTE ONLY): 21 min  Charges:  $Gait Training: 8-22 mins                     Blanchard Kelch PT Acute Rehabilitation Services Pager [phone] Office [phone]    Rada Hay 11/17/2020, 4:28 PM

## 2020-11-17 NOTE — Progress Notes (Signed)
Patient attempted to take morning medications crushed in a little water, immediately after swallowing pills and water came back up.  Patient states she feels nauseous and gags when the medication hits the back of her throat.  Patient has been taking in mostly only liquids and jello.  Will discuss with MD on rounds.

## 2020-11-17 NOTE — Progress Notes (Signed)
PROGRESS NOTE    Carla Little  QAS:[id number] DOB: [birth year] DOA: 10/31/2020 PCP: Patient, No Pcp Per (Inactive)    Brief Narrative:  30 year old female with history of morbid obesity, anxiety/depression, who felt a pop in the left knee when ambulating and suddenly experience instability with resultant mechanical fall.  EMS was called and she was found to have dislocated her knee.  This was recently reduced in the ED, MRI revealed a complete ACL tear, PCL tear and MCL strain as well as large effusion.  Orthopedic surgery was consulted and is managing with brace.  No plan for surgery.  Therapy recommended skilled nursing facility.  Assessment & Plan:   Active Problems:   Left knee dislocation   Knee dislocation, left, initial encounter   Left knee dislocation -ACL and PCL tear/avulsion fracture involving the head of fibula/patellar dislocation/MCL sprain -Orthopedics were consulted -Bledsoe hinged knee brace with serial exams to ensure that knee remains in place -Continue with analgesia as tolerated   Intractable nausea/vomiting -Initially noted to have improved after starting  pepcid 20 mg iv q 12 hr and reglan  10 mg iv q 8 hr -KUB was noted to be unremarkable -Continues to vomit, describes intolerance to solids but tolerates liquids -Will check esophagram    UTI -Complain of dysuria, UA was abnormal.   -Urine culture grew Proteus mirabilis, sensitive to ceftriaxone  -She was empirically started on ceftriaxone, switched to IV Ancef.   -Pt completed 7 days of abx   Persistent sinus tachycardia -D-dimer was elevated; CTA chest was negative for PE -Continue low-dose metoprolol 12.5 mg p.o. twice daily   Acute urinary retention -Resolved -Continue Urecholine   Acute hypoxemic respiratory failure -Concern for undiagnosed OHS/OSA -Pt will need outpatient sleep study   Iron-deficiency anemia -Ferritin low at 11, iron saturation 6% -Pt given IV iron on 8/12 -hold p.o.  iron supplementation for nausea/vomiting.   Hyponatremia -Noted to have resolved   Thrombocytosis -Likely reactive   Euthyroid sick syndrome -TSH 11.895, T4 0.99 -Check TSH in 4 to 6 weeks as outpatient   Constipation/fecal impaction -Noted to have resolved. Pt reports bowel movements -Started having liquid stools now -She did get milk of molasses enema yesterday -She also got milk of magnesia  30 cc p.o. x1 -Continue Senokot S tablet twice a day, MiraLAX 17 g p.o. twice daily   DVT prophylaxis: Lovenox subq Code Status: Full Family Communication: Pt in room, family at bedside  Status is: Inpatient  Remains inpatient appropriate because:Inpatient level of care appropriate due to severity of illness  Dispo: The patient is from: Home              Anticipated d/c is to: Home              Patient currently is not medically stable to d/c.   Difficult to place patient No  Consultants:  Orthopedics  Procedures:    Antimicrobials: Anti-infectives (From admission, onward)    Start     Dose/Rate Route Frequency Ordered Stop   11/13/20 1400  ceFAZolin (ANCEF) IVPB 1 g/50 mL premix        1 g 100 mL/hr over 30 Minutes Intravenous Every 8 hours 11/13/20 1153 11/17/20 2359   11/11/20 1130  cefTRIAXone (ROCEPHIN) 1 g in sodium chloride 0.9 % 100 mL IVPB  Status:  Discontinued        1 g 200 mL/hr over 30 Minutes Intravenous Every 24 hours 11/11/20 1033 11/13/20 1153  Subjective: Reports vomiting after trying to take meds this AM. Also reports vomiting after eating solid PO, tolerating liquids  Objective: Vitals:   11/16/20 1117 11/16/20 2048 11/17/20 0603 11/17/20 1443  BP: 122/87 120/70 107/66 (!) 108/57  Pulse: (!) 110 (!) 106 (!) 106 96  Resp: 20 16 20    Temp: 98.5 F (36.9 C) 97.8 F (36.6 C) 97.8 F (36.6 C) 97.8 F (36.6 C)  TempSrc: Oral Oral Oral   SpO2: 96% 100% 100% 99%  Weight:      Height:        Intake/Output Summary (Last 24 hours) at  11/17/2020 1552 Last data filed at 11/17/2020 1400 Gross per 24 hour  Intake 1816.97 ml  Output --  Net 1816.97 ml    Filed Weights   11/01/20 0026  Weight: (!) 181.4 kg    Examination: General exam: Conversant, in no acute distress Respiratory system: normal chest rise, clear, no audible wheezing Cardiovascular system: regular rhythm, s1-s2 Gastrointestinal system: Nondistended, nontender, pos BS Central nervous system: No seizures, no tremors Extremities: No cyanosis, no joint deformities Skin: No rashes, no pallor Psychiatry: Affect normal // no auditory hallucinations   Data Reviewed: I have personally reviewed following labs and imaging studies  CBC: Recent Labs  Lab 11/11/20 0332  WBC 8.6  HGB 8.2*  HCT 27.8*  MCV 87.7  PLT 300    Basic Metabolic Panel: Recent Labs  Lab 11/11/20 0332 11/17/20 0921  NA 135 137  K 3.5 3.0*  CL 100 103  CO2 26 27  GLUCOSE 94 91  BUN 8 <5*  CREATININE 0.59 0.59  CALCIUM 8.5* 8.4*    GFR: Estimated Creatinine Clearance: 169.5 mL/min (by C-G formula based on SCr of 0.59 mg/dL). Liver Function Tests: Recent Labs  Lab 11/14/20 1217 11/17/20 0921  AST 51* 49*  ALT 31 23  ALKPHOS 58 60  BILITOT 1.1 1.1  PROT 7.1 7.2  ALBUMIN 3.0* 3.0*    Recent Labs  Lab 11/14/20 1217  LIPASE 19    No results for input(s): AMMONIA in the last 168 hours. Coagulation Profile: No results for input(s): INR, PROTIME in the last 168 hours. Cardiac Enzymes: No results for input(s): CKTOTAL, CKMB, CKMBINDEX, TROPONINI in the last 168 hours. BNP (last 3 results) No results for input(s): PROBNP in the last 8760 hours. HbA1C: No results for input(s): HGBA1C in the last 72 hours. CBG: No results for input(s): GLUCAP in the last 168 hours. Lipid Profile: No results for input(s): CHOL, HDL, LDLCALC, TRIG, CHOLHDL, LDLDIRECT in the last 72 hours. Thyroid Function Tests: No results for input(s): TSH, T4TOTAL, FREET4, T3FREE, THYROIDAB  in the last 72 hours. Anemia Panel: No results for input(s): VITAMINB12, FOLATE, FERRITIN, TIBC, IRON, RETICCTPCT in the last 72 hours. Sepsis Labs: No results for input(s): PROCALCITON, LATICACIDVEN in the last 168 hours.  Recent Results (from the past 240 hour(s))  Urine Culture     Status: Abnormal   Collection Time: 11/10/20  4:47 PM   Specimen: Urine, Catheterized  Result Value Ref Range Status   Specimen Description   Final    URINE, CATHETERIZED Performed at [facility], [address], [place], [place]    Special Requests   Final    NONE Performed at [facility], [address] [place], [place]    Culture >=100,000 COLONIES/mL PROTEUS MIRABILIS (A)  Final   Report Status 11/13/2020 FINAL  Final   Organism ID, Bacteria PROTEUS MIRABILIS (  A)  Final      Susceptibility   Proteus mirabilis - MIC*    AMPICILLIN <=2 SENSITIVE Sensitive     CEFAZOLIN <=4 SENSITIVE Sensitive     CEFEPIME <=0.12 SENSITIVE Sensitive     CEFTRIAXONE <=0.25 SENSITIVE Sensitive     CIPROFLOXACIN <=0.25 SENSITIVE Sensitive     GENTAMICIN <=1 SENSITIVE Sensitive     IMIPENEM 2 SENSITIVE Sensitive     NITROFURANTOIN 128 RESISTANT Resistant     TRIMETH/SULFA <=20 SENSITIVE Sensitive     AMPICILLIN/SULBACTAM <=2 SENSITIVE Sensitive     PIP/TAZO <=4 SENSITIVE Sensitive     * >=100,000 COLONIES/mL PROTEUS MIRABILIS      Radiology Studies: No results found.  Scheduled Meds:  acetaminophen  1,000 mg Oral TID   cholecalciferol  2,000 Units Oral Daily   enoxaparin (LOVENOX) injection  0.5 mg/kg Subcutaneous Q24H   mouth rinse  15 mL Mouth Rinse BID   metoCLOPramide (REGLAN) injection  10 mg Intravenous Q8H   metoprolol tartrate  12.5 mg Oral BID   milk and molasses  1 enema Rectal Once   polyethylene glycol  17 g Oral BID   senna-docusate  2 tablet Oral BID   Continuous Infusions:  sodium chloride 75 mL/hr at 11/17/20 1400     ceFAZolin (ANCEF) IV 1 g (11/17/20 1404)   famotidine (PEPCID) IV 20 mg (11/17/20 0943)     LOS: 16 days   Rickey Barbara, MD [facility] Pager On Amion  If 7PM-7AM, please contact night-coverage 11/17/2020, 3:52 PM

## 2020-11-18 ENCOUNTER — Inpatient Hospital Stay (HOSPITAL_COMMUNITY): Payer: Medicaid Other

## 2020-11-18 DIAGNOSIS — M79605 Pain in left leg: Secondary | ICD-10-CM

## 2020-11-18 LAB — COMPREHENSIVE METABOLIC PANEL
ALT: 23 U/L (ref 0–44)
AST: 58 U/L — ABNORMAL HIGH (ref 15–41)
Albumin: 3.1 g/dL — ABNORMAL LOW (ref 3.5–5.0)
Alkaline Phosphatase: 63 U/L (ref 38–126)
Anion gap: 9 (ref 5–15)
BUN: <5 mg/dL — ABNORMAL LOW (ref 6–20)
CO2: 25 mmol/L (ref 22–32)
Calcium: 8.5 mg/dL — ABNORMAL LOW (ref 8.9–10.3)
Chloride: 101 mmol/L (ref 98–111)
Creatinine, Ser: 0.57 mg/dL (ref 0.44–1.00)
GFR, Estimated: >60 mL/min (ref >60)
Glucose, Bld: 97 mg/dL (ref 70–99)
Potassium: 3.4 mmol/L — ABNORMAL LOW (ref 3.5–5.1)
Sodium: 135 mmol/L (ref 135–145)
Total Bilirubin: 1.3 mg/dL — ABNORMAL HIGH (ref 0.3–1.2)
Total Protein: 7.4 g/dL (ref 6.5–8.1)

## 2020-11-18 LAB — MAGNESIUM: Magnesium: 2 mg/dL (ref 1.7–2.4)

## 2020-11-18 LAB — PREGNANCY, URINE: Preg Test, Ur: NEGATIVE

## 2020-11-18 MED ORDER — POTASSIUM CHLORIDE 10 MEQ/100ML IV SOLN
10.0000 meq | INTRAVENOUS | Status: AC
Start: 2020-11-18 — End: 2020-11-18
  Administered 2020-11-18 (×6): 10 meq via INTRAVENOUS
  Filled 2020-11-18 (×6): qty 100

## 2020-11-18 MED ORDER — SODIUM CHLORIDE 0.9 % IV SOLN: INTRAVENOUS | Status: DC

## 2020-11-18 NOTE — Progress Notes (Signed)
OT Cancellation Note  Patient Details Name: Carla Little MRN: [id number] DOB: 1990/11/06   Cancelled Treatment:    Reason Eval/Treat Not Completed: Fatigue/lethargy limiting ability to participate. Patient declined therapy today citing fatigue from testing. Reports doing her UE exercises in the bed.  Lewanda Perea L Sylus Stgermain 11/18/2020, 2:18 PM

## 2020-11-18 NOTE — Progress Notes (Signed)
Left lower extremity venous duplex has been completed. Preliminary results can be found in CV Proc through chart review.   11/18/20 10:42 AM Olen Cordial RVT

## 2020-11-18 NOTE — Anesthesia Preprocedure Evaluation (Addendum)
Anesthesia Evaluation  Patient identified by MRN, date of birth, ID band  Reviewed: Allergy & Precautions, NPO status , Patient's Chart, lab work & pertinent test results  History of Anesthesia Complications Negative for: history of anesthetic complications  Airway Mallampati: III  TM Distance: >3 FB     Dental no notable dental hx. (+) Dental Advisory Given   Pulmonary neg pulmonary ROS,    Pulmonary exam normal        Cardiovascular negative cardio ROS Normal cardiovascular exam     Neuro/Psych PSYCHIATRIC DISORDERS Depression negative neurological ROS     GI/Hepatic Neg liver ROS, GERD  ,  Endo/Other  Morbid obesity  Renal/GU negative Renal ROS     Musculoskeletal   Abdominal   Peds  Hematology negative hematology ROS (+)   Anesthesia Other Findings   Reproductive/Obstetrics                            Anesthesia Physical Anesthesia Plan  ASA: 3  Anesthesia Plan: MAC   Post-op Pain Management:    Induction:   PONV Risk Score and Plan: 2 and Ondansetron and Propofol infusion  Airway Management Planned: Natural Airway  Additional Equipment:   Intra-op Plan:   Post-operative Plan:   Informed Consent: I have reviewed the patients History and Physical, chart, labs and discussed the procedure including the risks, benefits and alternatives for the proposed anesthesia with the patient or authorized representative who has indicated his/her understanding and acceptance.     Dental advisory given  Plan Discussed with: Anesthesiologist, CRNA and Surgeon  Anesthesia Plan Comments:        Anesthesia Quick Evaluation

## 2020-11-18 NOTE — Progress Notes (Signed)
PT Cancellation Note  Patient Details Name: Carla Little MRN: [id number] DOB: 1990/12/13   Cancelled Treatment:    Reason Eval/Treat Not Completed: Patient declined, no reason specified Pt politely declines mobility today stating she has been in/out bed several times today already.  Pt also reports recent emesis episode and would like to rest.  Pt has had more pain today too (not able to keep PO meds down per pt)   Lylian Sanagustin,KATHrine E 11/18/2020, 1:34 PM Paulino Door, DPT Acute Rehabilitation Services Pager: [phone] Office: [phone]

## 2020-11-18 NOTE — Progress Notes (Signed)
PROGRESS NOTE    ESSYNCE MUNSCH  QBH:[id number] DOB: 1990/06/18 DOA: 10/31/2020 PCP: Patient, No Pcp Per (Inactive)    Brief Narrative:  30 year old female with history of morbid obesity, anxiety/depression, who felt a pop in the left knee when ambulating and suddenly experience instability with resultant mechanical fall.  EMS was called and she was found to have dislocated her knee.  This was recently reduced in the ED, MRI revealed a complete ACL tear, PCL tear and MCL strain as well as large effusion.  Orthopedic surgery was consulted and is managing with brace.  No plan for surgery.  Therapy recommended skilled nursing facility.  Assessment & Plan:   Active Problems:   Left knee dislocation   Knee dislocation, left, initial encounter   Left knee dislocation -ACL and PCL tear/avulsion fracture involving the head of fibula/patellar dislocation/MCL sprain -Orthopedics were consulted -Bledsoe hinged knee brace with serial exams to ensure that knee remains in place -Would continue with analgesia as tolerated   Intractable nausea/vomiting -Initially noted to have improved after starting  pepcid 20 mg iv q 12 hr and reglan  10 mg iv q 8 hr -KUB was noted to be unremarkable -Continues to vomit, describes intolerance to solids but tolerates liquids -attempted esophagram however pt vomited the contrast, thus was an incomplete study -Will check urine pregnancy -Appreciate input by GI. Plan for EGD tomorrow   UTI -Complain of dysuria, UA was abnormal.   -Urine culture grew Proteus mirabilis, sensitive to ceftriaxone  -She was empirically started on ceftriaxone, switched to IV Ancef.   -Pt completed 7 days of abx   Persistent sinus tachycardia -D-dimer was elevated; CTA chest was negative for PE -Continue low-dose metoprolol 12.5 mg p.o. twice daily   Acute urinary retention -Resolved -Continue Urecholine   Acute hypoxemic respiratory failure -Concern for undiagnosed  OHS/OSA -Pt will need outpatient sleep study   Iron-deficiency anemia -Ferritin low at 11, iron saturation 6% -Pt given IV iron on 8/12 -hold p.o. iron supplementation for nausea/vomiting.   Hyponatremia -Noted to have resolved   Thrombocytosis -Likely reactive   Euthyroid sick syndrome -TSH 11.895, T4 0.99 -Check TSH in 4 to 6 weeks as outpatient   Constipation/fecal impaction -Noted to have resolved. Pt reports bowel movements -Started having liquid stools now -She did get milk of molasses enema yesterday -She also got milk of magnesia  30 cc p.o. x1 -Continue Senokot S tablet twice a day, MiraLAX 17 g p.o. twice daily   DVT prophylaxis: Lovenox subq Code Status: Full Family Communication: Pt in room, family at bedside  Status is: Inpatient  Remains inpatient appropriate because:Inpatient level of care appropriate due to severity of illness  Dispo: The patient is from: Home              Anticipated d/c is to: Home              Patient currently is not medically stable to d/c.   Difficult to place patient No  Consultants:  Orthopedics GI  Procedures:    Antimicrobials: Anti-infectives (From admission, onward)    Start     Dose/Rate Route Frequency Ordered Stop   11/13/20 1400  ceFAZolin (ANCEF) IVPB 1 g/50 mL premix        1 g 100 mL/hr over 30 Minutes Intravenous Every 8 hours 11/13/20 1153 11/17/20 2240   11/11/20 1130  cefTRIAXone (ROCEPHIN) 1 g in sodium chloride 0.9 % 100 mL IVPB  Status:  Discontinued  1 g 200 mL/hr over 30 Minutes Intravenous Every 24 hours 11/11/20 1033 11/13/20 1153       Subjective: Still nauseated. Vomited contrast during esophagram  Objective: Vitals:   11/17/20 1443 11/17/20 1924 11/18/20 0615 11/18/20 1342  BP: (!) 108/57 129/90 (!) 112/53 121/68  Pulse: 96 (!) 103 100 (!) 105  Resp:  18  18  Temp: 97.8 F (36.6 C) 97.6 F (36.4 C) 97.8 F (36.6 C) 98.6 F (37 C)  TempSrc:  Oral Oral Oral  SpO2: 99% 100%  95% 99%  Weight:      Height:        Intake/Output Summary (Last 24 hours) at 11/18/2020 1550 Last data filed at 11/18/2020 [id number] Gross per 24 hour  Intake 663.72 ml  Output --  Net 663.72 ml    Filed Weights   11/01/20 0026  Weight: (!) 181.4 kg    Examination: General exam: Awake, laying in bed, in nad Respiratory system: Normal respiratory effort, no wheezing Cardiovascular system: regular rate, s1, s2 Gastrointestinal system: morbid obese, distant BS Central nervous system: CN2-12 grossly intact, strength intact Extremities: Perfused, no clubbing Skin: Normal skin turgor, no notable skin lesions seen Psychiatry: Mood normal // no visual hallucinations   Data Reviewed: I have personally reviewed following labs and imaging studies  CBC: No results for input(s): WBC, NEUTROABS, HGB, HCT, MCV, PLT in the last 168 hours.  Basic Metabolic Panel: Recent Labs  Lab 11/17/20 0921 11/18/20 0737  NA 137 135  K 3.0* 3.4*  CL 103 101  CO2 27 25  GLUCOSE 91 97  BUN <5* <5*  CREATININE 0.59 0.57  CALCIUM 8.4* 8.5*  MG  --  2.0    GFR: Estimated Creatinine Clearance: 169.5 mL/min (by C-G formula based on SCr of 0.57 mg/dL). Liver Function Tests: Recent Labs  Lab 11/14/20 1217 11/17/20 0921 11/18/20 0737  AST 51* 49* 58*  ALT 31 23 23   ALKPHOS 58 60 63  BILITOT 1.1 1.1 1.3*  PROT 7.1 7.2 7.4  ALBUMIN 3.0* 3.0* 3.1*    Recent Labs  Lab 11/14/20 1217  LIPASE 19    No results for input(s): AMMONIA in the last 168 hours. Coagulation Profile: No results for input(s): INR, PROTIME in the last 168 hours. Cardiac Enzymes: No results for input(s): CKTOTAL, CKMB, CKMBINDEX, TROPONINI in the last 168 hours. BNP (last 3 results) No results for input(s): PROBNP in the last 8760 hours. HbA1C: No results for input(s): HGBA1C in the last 72 hours. CBG: No results for input(s): GLUCAP in the last 168 hours. Lipid Profile: No results for input(s): CHOL, HDL, LDLCALC,  TRIG, CHOLHDL, LDLDIRECT in the last 72 hours. Thyroid Function Tests: No results for input(s): TSH, T4TOTAL, FREET4, T3FREE, THYROIDAB in the last 72 hours. Anemia Panel: No results for input(s): VITAMINB12, FOLATE, FERRITIN, TIBC, IRON, RETICCTPCT in the last 72 hours. Sepsis Labs: No results for input(s): PROCALCITON, LATICACIDVEN in the last 168 hours.  Recent Results (from the past 240 hour(s))  Urine Culture     Status: Abnormal   Collection Time: 11/10/20  4:47 PM   Specimen: Urine, Catheterized  Result Value Ref Range Status   Specimen Description   Final    URINE, CATHETERIZED Performed at [facility], [address] [place], [place] [address]    Special Requests   Final    NONE Performed at [facility], [address] [place], [place] [address]    Culture >=100,000 COLONIES/mL PROTEUS MIRABILIS (  A)  Final   Report Status 11/13/2020 FINAL  Final   Organism ID, Bacteria PROTEUS MIRABILIS (A)  Final      Susceptibility   Proteus mirabilis - MIC*    AMPICILLIN <=2 SENSITIVE Sensitive     CEFAZOLIN <=4 SENSITIVE Sensitive     CEFEPIME <=0.12 SENSITIVE Sensitive     CEFTRIAXONE <=0.25 SENSITIVE Sensitive     CIPROFLOXACIN <=0.25 SENSITIVE Sensitive     GENTAMICIN <=1 SENSITIVE Sensitive     IMIPENEM 2 SENSITIVE Sensitive     NITROFURANTOIN 128 RESISTANT Resistant     TRIMETH/SULFA <=20 SENSITIVE Sensitive     AMPICILLIN/SULBACTAM <=2 SENSITIVE Sensitive     PIP/TAZO <=4 SENSITIVE Sensitive     * >=100,000 COLONIES/mL PROTEUS MIRABILIS      Radiology Studies: VAS [place] LOWER EXTREMITY VENOUS (DVT)  Result Date: 11/18/2020  Lower Venous DVT Study Patient Name:  DETTA MELLIN  Date of Exam:   11/18/2020 Medical Rec #: [id number]          Accession #:    [id number] Date of Birth: 04/08/1990         Patient Gender: F Patient Age:   52 years Exam Location:  [facility] Procedure:      VAS [place] LOWER EXTREMITY VENOUS (DVT)  Referring Phys: Amiel Sharrow --------------------------------------------------------------------------------  Indications: Pain.  Risk Factors: Trauma. Limitations: Body habitus and poor ultrasound/tissue interface. Comparison Study: No prior studies. Performing Technologist: Chanda Busing RVT  Examination Guidelines: A complete evaluation includes B-mode imaging, spectral Doppler, color Doppler, and power Doppler as needed of all accessible portions of each vessel. Bilateral testing is considered an integral part of a complete examination. Limited examinations for reoccurring indications may be performed as noted. The reflux portion of the exam is performed with the patient in reverse Trendelenburg.  +-----+---------------+---------+-----------+----------+--------------+ RIGHTCompressibilityPhasicitySpontaneityPropertiesThrombus Aging +-----+---------------+---------+-----------+----------+--------------+ CFV  Full           Yes      Yes                                 +-----+---------------+---------+-----------+----------+--------------+   +---------+---------------+---------+-----------+----------+-------------------+ LEFT     CompressibilityPhasicitySpontaneityPropertiesThrombus Aging      +---------+---------------+---------+-----------+----------+-------------------+ CFV      Full           Yes      Yes                                      +---------+---------------+---------+-----------+----------+-------------------+ SFJ      Full                                                             +---------+---------------+---------+-----------+----------+-------------------+ FV Prox  Full                                                             +---------+---------------+---------+-----------+----------+-------------------+ FV Mid   Full                                                              +---------+---------------+---------+-----------+----------+-------------------+  FV Distal               Yes      Yes                                      +---------+---------------+---------+-----------+----------+-------------------+ PFV      Full                                                             +---------+---------------+---------+-----------+----------+-------------------+ POP      Full           Yes      Yes                                      +---------+---------------+---------+-----------+----------+-------------------+ PTV      Full                                                             +---------+---------------+---------+-----------+----------+-------------------+ PERO                                                  Not well visualized +---------+---------------+---------+-----------+----------+-------------------+    Summary: RIGHT: - No evidence of common femoral vein obstruction.  LEFT: - There is no evidence of deep vein thrombosis in the lower extremity. However, portions of this examination were limited- see technologist comments above.  - No cystic structure found in the popliteal fossa.  *See table(s) above for measurements and observations. Electronically signed by Sherald Hess MD on 11/18/2020 at 2:43:02 PM.    Final    DG ESOPHAGUS W SINGLE CM (SOL OR THIN BA)  Result Date: 11/18/2020 CLINICAL DATA:  Emesis with attempted taking of pills. Nausea. Gagging. EXAM: ESOPHOGRAM/BARIUM SWALLOW TECHNIQUE: Single contrast examination was performed using  thin barium. FLUOROSCOPY TIME:  Fluoroscopy Time:   minute and 0 seconds Radiation Exposure Index (if provided by the fluoroscopic device): 23.4 mGy Number of Acquired Spot Images: 0 COMPARISON:  11/03/2020 CTA chest FINDINGS: An attempted focused single-contrast exam was initiated. The patient was placed in LPO position, as secondary to immobility and morbid obesity, she could not stand  upright or be placed in RAO position. With attempted single swallow, no gross narrowing or stricture is identified. Prior to contrast traversing the distal esophagus, the patient exhibited emesis. A second attempt was performed, with immediate emesis as well. IMPRESSION: Extremely limited exam, secondary to episodes of emesis with individual swallows. No gross esophageal stricture identified to the level of the lower thoracic esophagus. Electronically Signed   By: Jeronimo Greaves M.D.   On: 11/18/2020 12:12    Scheduled Meds:  acetaminophen  1,000 mg Oral TID   cholecalciferol  2,000 Units Oral Daily   enoxaparin (LOVENOX) injection  0.5 mg/kg Subcutaneous Q24H   mouth rinse  15 mL  Mouth Rinse BID   metoCLOPramide (REGLAN) injection  10 mg Intravenous Q8H   metoprolol tartrate  12.5 mg Oral BID   milk and molasses  1 enema Rectal Once   polyethylene glycol  17 g Oral BID   senna-docusate  2 tablet Oral BID   Continuous Infusions:  sodium chloride 75 mL/hr at 11/17/20 1400   potassium chloride 10 mEq (11/18/20 1513)     LOS: 17 days   Rickey Barbara, MD [facility] Pager On Amion  If 7PM-7AM, please contact night-coverage 11/18/2020, 3:50 PM

## 2020-11-18 NOTE — Consult Note (Signed)
[facility] UNASSIGNED CONSULT  Reason for Consult:Nausea and vomiting Referring Physician: [facility]  [name] HPI: This is a 30 year old female admitted for a fall with a left knee dislocation, fracture of the proximal fibula, ligamentous avulsion, and ACL and PCL tear who complains about nausea and vomiting.  On 11/13/2020 she was was treated for her constipation and possible fecal impaction with Milk of Molasses enema, MOM PO, Senokot, and Miralax, but this was not effective.  A fecal disimpaction was attempted later that evening, but the RN did not palpate any stool.  On 11/14/2020 she started to experience nausea and vomiting.  A KUB was ordered and read as unremarkable.  Pepcid and Reglan were provided for her, but this did not help her symptoms.  Simple PO intake with water results in an immediate vomiting of the water, but she is able to tolerate Jell-O.  An esophagram was attempted today, but it was suboptimal as the patient vomited the contrast.  The examination only showed that she did not have a stricture down to the lower thoracic esophagus.  She does not report any problems with dysphagia currently or in the past.  The patient did report mild and intermittent symptoms of GERD in the past, but it was never very bothersome.  Past Medical History:  Diagnosis Date   Depression    GERD (gastroesophageal reflux disease)    Obesity     History reviewed. No pertinent surgical history.  History reviewed. No pertinent family history.  Social History:  reports that she has never smoked. She has never used smokeless tobacco. She reports current alcohol use. She reports that she does not use drugs.  Allergies:  Allergies  Allergen Reactions   Azithromycin Shortness Of Breath and Nausea And Vomiting    Medications: Scheduled:  acetaminophen  1,000 mg Oral TID   cholecalciferol  2,000 Units Oral Daily   enoxaparin (LOVENOX) injection  0.5 mg/kg Subcutaneous Q24H   mouth rinse  15  mL Mouth Rinse BID   metoCLOPramide (REGLAN) injection  10 mg Intravenous Q8H   metoprolol tartrate  12.5 mg Oral BID   milk and molasses  1 enema Rectal Once   polyethylene glycol  17 g Oral BID   senna-docusate  2 tablet Oral BID   Continuous:  sodium chloride 75 mL/hr at 11/17/20 1400   potassium chloride 10 mEq (11/18/20 1243)    Results for orders placed or performed during the hospital encounter of 10/31/20 (from the past 24 hour(s))  Comprehensive metabolic panel     Status: Abnormal   Collection Time: 11/18/20  7:37 AM  Result Value Ref Range   Sodium 135 135 - 145 mmol/L   Potassium 3.4 (L) 3.5 - 5.1 mmol/L   Chloride 101 98 - 111 mmol/L   CO2 25 22 - 32 mmol/L   Glucose, Bld 97 70 - 99 mg/dL   BUN <5 (L) 6 - 20 mg/dL   Creatinine, Ser 7.85 0.44 - 1.00 mg/dL   Calcium 8.5 (L) 8.9 - 10.3 mg/dL   Total Protein 7.4 6.5 - 8.1 g/dL   Albumin 3.1 (L) 3.5 - 5.0 g/dL   AST 58 (H) 15 - 41 U/L   ALT 23 0 - 44 U/L   Alkaline Phosphatase 63 38 - 126 U/L   Total Bilirubin 1.3 (H) 0.3 - 1.2 mg/dL   GFR, Estimated >88 >50 mL/min   Anion gap 9 5 - 15  Magnesium     Status: None  Collection Time: 11/18/20  7:37 AM  Result Value Ref Range   Magnesium 2.0 1.7 - 2.4 mg/dL     VAS [place] LOWER EXTREMITY VENOUS (DVT)  Result Date: 11/18/2020  Lower Venous DVT Study Patient Name:  Carla Little  Date of Exam:   11/18/2020 Medical Rec #: [id number]          Accession #:    [id number] Date of Birth: 03-Aug-1990         Patient Gender: F Patient Age:   53 years Exam Location:  [facility] [place] Procedure:      VAS [place] LOWER EXTREMITY VENOUS (DVT) Referring Phys: STEPHEN CHIU --------------------------------------------------------------------------------  Indications: Pain.  Risk Factors: Trauma. Limitations: Body habitus and poor ultrasound/tissue interface. Comparison Study: No prior studies. Performing Technologist: Chanda Busing RVT  Examination Guidelines: A complete evaluation includes  B-mode imaging, spectral Doppler, color Doppler, and power Doppler as needed of all accessible portions of each vessel. Bilateral testing is considered an integral part of a complete examination. Limited examinations for reoccurring indications may be performed as noted. The reflux portion of the exam is performed with the patient in reverse Trendelenburg.  +-----+---------------+---------+-----------+----------+--------------+ RIGHTCompressibilityPhasicitySpontaneityPropertiesThrombus Aging +-----+---------------+---------+-----------+----------+--------------+ CFV  Full           Yes      Yes                                 +-----+---------------+---------+-----------+----------+--------------+   +---------+---------------+---------+-----------+----------+-------------------+ LEFT     CompressibilityPhasicitySpontaneityPropertiesThrombus Aging      +---------+---------------+---------+-----------+----------+-------------------+ CFV      Full           Yes      Yes                                      +---------+---------------+---------+-----------+----------+-------------------+ SFJ      Full                                                             +---------+---------------+---------+-----------+----------+-------------------+ FV Prox  Full                                                             +---------+---------------+---------+-----------+----------+-------------------+ FV Mid   Full                                                             +---------+---------------+---------+-----------+----------+-------------------+ FV Distal               Yes      Yes                                      +---------+---------------+---------+-----------+----------+-------------------+ PFV  Full                                                             +---------+---------------+---------+-----------+----------+-------------------+ POP       Full           Yes      Yes                                      +---------+---------------+---------+-----------+----------+-------------------+ PTV      Full                                                             +---------+---------------+---------+-----------+----------+-------------------+ PERO                                                  Not well visualized +---------+---------------+---------+-----------+----------+-------------------+    Summary: RIGHT: - No evidence of common femoral vein obstruction.  LEFT: - There is no evidence of deep vein thrombosis in the lower extremity. However, portions of this examination were limited- see technologist comments above.  - No cystic structure found in the popliteal fossa.  *See table(s) above for measurements and observations.    Preliminary    DG ESOPHAGUS W SINGLE CM (SOL OR THIN BA)  Result Date: 11/18/2020 CLINICAL DATA:  Emesis with attempted taking of pills. Nausea. Gagging. EXAM: ESOPHOGRAM/BARIUM SWALLOW TECHNIQUE: Single contrast examination was performed using  thin barium. FLUOROSCOPY TIME:  Fluoroscopy Time:   minute and 0 seconds Radiation Exposure Index (if provided by the fluoroscopic device): 23.4 mGy Number of Acquired Spot Images: 0 COMPARISON:  11/03/2020 CTA chest FINDINGS: An attempted focused single-contrast exam was initiated. The patient was placed in LPO position, as secondary to immobility and morbid obesity, she could not stand upright or be placed in RAO position. With attempted single swallow, no gross narrowing or stricture is identified. Prior to contrast traversing the distal esophagus, the patient exhibited emesis. A second attempt was performed, with immediate emesis as well. IMPRESSION: Extremely limited exam, secondary to episodes of emesis with individual swallows. No gross esophageal stricture identified to the level of the lower thoracic esophagus. Electronically Signed   By: Jeronimo Greaves M.D.    On: 11/18/2020 12:12    ROS:  As stated above in the HPI otherwise negative.  Blood pressure (!) 112/53, pulse 100, temperature 97.8 F (36.6 C), temperature source Oral, resp. rate 18, height 5\' 4"  (1.626 m), weight (!) 181.4 kg, last menstrual period 11/12/2020, SpO2 95 %.    PE: Gen: NAD, Alert and Oriented HEENT:  [place]/AT, EOMI Neck: Supple, no LAD Lungs: CTA Bilaterally CV: RRR without M/G/R ABD: Soft, NTND, +BS, morbidly obese Ext: No C/C/E  Assessment/Plan: 1) Persistent nausea and vomiting. 2) Left knee dislocation with ligamentous injury. 3) Morbid obesity.   The nausea and vomiting are new problems for her.  The source is unclear.  She was treated with antibiotics as recently as 11/15/2020 with cefazolin.  This does predispose her to a Candidal esophagitis.  Plan: 1) EGD tomorrow.  Katrice Goel D 11/18/2020, 1:22 PM

## 2020-11-19 ENCOUNTER — Inpatient Hospital Stay (HOSPITAL_COMMUNITY): Payer: Medicaid Other | Admitting: Anesthesiology

## 2020-11-19 ENCOUNTER — Inpatient Hospital Stay (HOSPITAL_COMMUNITY): Payer: Medicaid Other

## 2020-11-19 ENCOUNTER — Encounter (HOSPITAL_COMMUNITY): Payer: Self-pay | Admitting: Orthopaedic Surgery

## 2020-11-19 ENCOUNTER — Encounter (HOSPITAL_COMMUNITY)
Admission: EM | Disposition: A | Discharge status: Home Health | Payer: Self-pay | Source: Home / Self Care | Attending: Internal Medicine

## 2020-11-19 DIAGNOSIS — K5901 Slow transit constipation: Secondary | ICD-10-CM

## 2020-11-19 HISTORY — PX: ESOPHAGOGASTRODUODENOSCOPY (EGD) WITH PROPOFOL: SHX5813

## 2020-11-19 HISTORY — PX: BIOPSY: SHX5522

## 2020-11-19 LAB — COMPREHENSIVE METABOLIC PANEL
ALT: 20 U/L (ref 0–44)
AST: 48 U/L — ABNORMAL HIGH (ref 15–41)
Albumin: 2.9 g/dL — ABNORMAL LOW (ref 3.5–5.0)
Alkaline Phosphatase: 56 U/L (ref 38–126)
Anion gap: 9 (ref 5–15)
BUN: <5 mg/dL — ABNORMAL LOW (ref 6–20)
CO2: 25 mmol/L (ref 22–32)
Calcium: 8.3 mg/dL — ABNORMAL LOW (ref 8.9–10.3)
Chloride: 101 mmol/L (ref 98–111)
Creatinine, Ser: 0.52 mg/dL (ref 0.44–1.00)
GFR, Estimated: >60 mL/min (ref >60)
Glucose, Bld: 93 mg/dL (ref 70–99)
Potassium: 3.4 mmol/L — ABNORMAL LOW (ref 3.5–5.1)
Sodium: 135 mmol/L (ref 135–145)
Total Bilirubin: 1.4 mg/dL — ABNORMAL HIGH (ref 0.3–1.2)
Total Protein: 6.8 g/dL (ref 6.5–8.1)

## 2020-11-19 LAB — MAGNESIUM: Magnesium: 2 mg/dL (ref 1.7–2.4)

## 2020-11-19 SURGERY — ESOPHAGOGASTRODUODENOSCOPY (EGD) WITH PROPOFOL
Anesthesia: Monitor Anesthesia Care

## 2020-11-19 MED ORDER — KETAMINE HCL 10 MG/ML IJ SOLN
INTRAMUSCULAR | Status: DC | PRN
  Administered 2020-11-19: 30 mg via INTRAVENOUS

## 2020-11-19 MED ORDER — POTASSIUM CHLORIDE 10 MEQ/100ML IV SOLN: 10.0000 meq | INTRAVENOUS | Status: DC

## 2020-11-19 MED ORDER — LACTATED RINGERS IV SOLN: INTRAVENOUS | Status: DC | PRN

## 2020-11-19 MED ORDER — PROPOFOL 500 MG/50ML IV EMUL
INTRAVENOUS | Status: AC
  Filled 2020-11-19: qty 200

## 2020-11-19 MED ORDER — PROPOFOL 1000 MG/100ML IV EMUL
INTRAVENOUS | Status: AC
  Filled 2020-11-19: qty 100

## 2020-11-19 MED ORDER — PANTOPRAZOLE SODIUM 40 MG PO TBEC
40.0000 mg | DELAYED_RELEASE_TABLET | Freq: Every day | ORAL | Status: DC
  Administered 2020-11-19 – 2020-11-20 (×2): 40 mg via ORAL
  Filled 2020-11-19 (×2): qty 1

## 2020-11-19 MED ORDER — PROPOFOL 500 MG/50ML IV EMUL
INTRAVENOUS | Status: DC | PRN
  Administered 2020-11-19: 100 ug/kg/min via INTRAVENOUS

## 2020-11-19 MED ORDER — KETAMINE HCL 10 MG/ML IJ SOLN
INTRAMUSCULAR | Status: AC
  Filled 2020-11-19: qty 1

## 2020-11-19 SURGICAL SUPPLY — 14 items

## 2020-11-19 NOTE — Op Note (Signed)
[facility] Patient Name: Carla Little Procedure Date: 11/19/2020 MRN: [id number] Attending MD: Juanita Craver , MD Date of Birth: 1990/11/14 CSN: [id number] Age: 30 Admit Type: Inpatient Procedure:                EGD with antral biopsies. Indications:              Nausea with vomiting. Providers:                Juanita Craver, MD, Particia Nearing, RN, Elspeth Cho                            Technician, Technician, Lerry Paterson, CRNA Referring MD:             [facility]. Medicines:                Monitored Anesthesia Care Complications:            No immediate complications. Estimated Blood Loss:     Estimated blood loss was minimal. Procedure:                Pre-Anesthesia Assessment: - Prior to the                            procedure, a history and physical was performed,                            and patient medications and allergies were                            reviewed. The patient's tolerance of previous                            anesthesia was also reviewed. The risks and                            benefits of the procedure and the sedation options                            and risks were discussed with the patient. All                            questions were answered, and informed consent was                            obtained. Prior Anticoagulants: The patient has                            taken no previous anticoagulant or antiplatelet                            agents. ASA Grade Assessment: II - A patient with                            mild systemic disease. After reviewing the risks  and benefits, the patient was deemed in                            satisfactory condition to undergo the procedure.                            After obtaining informed consent, the endoscope was                            passed under direct vision. Throughout the                            procedure, the patient's blood pressure,  pulse, and                            oxygen saturations were monitored continuously. The                            [id number]-H190 ([id number]) Olympus endoscope was introduced                            through the mouth, and advanced to the second part                            of duodenum. The EGD was accomplished without                            difficulty. The patient tolerated the procedure                            well. Scope In: Scope Out: Findings:      LA Grade B (one or more mucosal breaks greater than 5 mm, not extending       between the tops of two mucosal folds) esophagitis with no bleeding was       found.      One small, non-bleeding superficial gastric ulcer was found in the       gastric antrum-biopsies were done to rule out H. pylori by pathology.      The cardia and gastric fundus were normal on retroflexion.      The examined duodenum was normal. Impression:               - LA Grade B reflux esophagitis with no bleeding.                           - One small non-bleeding gastric ulcer in the                            antrum-biopsied to rule out H. pylori by pathology.                           - Normal examined duodenum. Moderate Sedation:      MAC used. Recommendation:           - Low fat diet daily.                           -  Continue present medications.                           - Await pathology results.                           - Perform a RUQ ultrasound to be scheduled-rule out                            cholelithiasis.                           - Use Protonix (Pantoprazole) 40 mg PO daily.                           - No Ibuprofen, Naproxen, or other non-steroidal                            anti-inflammatory drugs for now. Procedure Code(s):        --- Professional ---                           [phone], Esophagogastroduodenoscopy, flexible,                            transoral; with biopsy, single or multiple Diagnosis Code(s):        --- Professional  ---                           R11.2, Nausea with vomiting, unspecified                           K25.9, Gastric ulcer, unspecified as acute or                            chronic, without hemorrhage or perforation                           K21.00, Gastro-esophageal reflux disease with                            esophagitis, without bleeding CPT copyright 2019 American Medical Association. All rights reserved. The codes documented in this report are preliminary and upon coder review may  be revised to meet current compliance requirements. Juanita Craver, MD Juanita Craver, MD 11/19/2020 10:58:59 AM This report has been signed electronically. Number of Addenda: 0

## 2020-11-19 NOTE — Progress Notes (Signed)
PROGRESS NOTE    Carla Little  MVH:[id number] DOB: [birth year] DOA: 10/31/2020 PCP: Patient, No Pcp Per (Inactive)    Brief Narrative:  30 year old female with history of morbid obesity, anxiety/depression, who felt a pop in the left knee when ambulating and suddenly experience instability with resultant mechanical fall.  EMS was called and she was found to have dislocated her knee.  This was recently reduced in the ED, MRI revealed a complete ACL tear, PCL tear and MCL strain as well as large effusion.  Orthopedic surgery was consulted and is managing with brace.  No plan for surgery.  Therapy recommended skilled nursing facility.  Assessment & Plan:   Active Problems:   Left knee dislocation   Knee dislocation, left, initial encounter   Left knee dislocation -ACL and PCL tear/avulsion fracture involving the head of fibula/patellar dislocation/MCL sprain -Orthopedics were consulted -Bledsoe hinged knee brace with serial exams to ensure that knee remains in place -Continue with analgesia as tolerated   Intractable nausea/vomiting -Initially noted to have improved after starting  pepcid 20 mg iv q 12 hr and reglan  10 mg iv q 8 hr -KUB was noted to be unremarkable -Continues to vomit, describes intolerance to solids but tolerates liquids -attempted esophagram however pt vomited the contrast, thus was an incomplete study -Urine pregnancy test was negative -Appreciate assistance by gastroenterology.  Patient is now status post EGD with findings of gastritis as well as a small superficial ulcer. -Gastroenterology recommendation for follow-up abdominal ultrasound   UTI -Complain of dysuria, UA was abnormal.   -Urine culture grew Proteus mirabilis, sensitive to ceftriaxone  -She was empirically started on ceftriaxone, switched to IV Ancef.   -Pt completed 7 days of abx   Persistent sinus tachycardia -D-dimer was elevated; CTA chest was negative for PE -Continue low-dose  metoprolol 12.5 mg p.o. twice daily   Acute urinary retention -Resolved -Continue Urecholine   Acute hypoxemic respiratory failure -Concern for undiagnosed OHS/OSA -Pt will need outpatient sleep study   Iron-deficiency anemia -Ferritin low at 11, iron saturation 6% -Pt given IV iron on 8/12 -hold p.o. iron supplementation for nausea/vomiting.   Hyponatremia -Noted to have resolved   Thrombocytosis -Likely reactive   Euthyroid sick syndrome -TSH 11.895, T4 0.99 -Check TSH in 4 to 6 weeks as outpatient   Constipation/fecal impaction -Noted to have resolved. Pt reports bowel movements -Started having liquid stools now -She did get milk of molasses enema yesterday -She also got milk of magnesia  30 cc p.o. x1 -Continue Senokot S tablet twice a day, MiraLAX 17 g p.o. twice daily   DVT prophylaxis: Lovenox subq Code Status: Full Family Communication: Pt in room, family currently not at bedside  Status is: Inpatient  Remains inpatient appropriate because:Inpatient level of care appropriate due to severity of illness  Dispo: The patient is from: Home              Anticipated d/c is to: Home              Patient currently is not medically stable to d/c.   Difficult to place patient No  Consultants:  Orthopedics GI  Procedures:  EGD 11/19/2020  Antimicrobials: Anti-infectives (From admission, onward)    Start     Dose/Rate Route Frequency Ordered Stop   11/13/20 1400  ceFAZolin (ANCEF) IVPB 1 g/50 mL premix        1 g 100 mL/hr over 30 Minutes Intravenous Every 8 hours 11/13/20 1153 11/17/20 2240  11/11/20 1130  cefTRIAXone (ROCEPHIN) 1 g in sodium chloride 0.9 % 100 mL IVPB  Status:  Discontinued        1 g 200 mL/hr over 30 Minutes Intravenous Every 24 hours 11/11/20 1033 11/13/20 1153       Subjective: Patient seen after EGD this morning.  Still complains of nausea  Objective: Vitals:   11/19/20 1054 11/19/20 1100 11/19/20 1110 11/19/20 1133  BP:  112/68 114/71 124/65 115/69  Pulse: (!) 101 (!) 110 (!) 103 (!) 104  Resp: 17 (!) 24 (!) 26 19  Temp: 98.5 F (36.9 C)   97.7 F (36.5 C)  TempSrc: Axillary   Oral  SpO2: 100% 92% 100% 100%  Weight:      Height:        Intake/Output Summary (Last 24 hours) at 11/19/2020 1527 Last data filed at 11/19/2020 1450 Gross per 24 hour  Intake 3098.18 ml  Output --  Net 3098.18 ml    Filed Weights   11/01/20 0026  Weight: (!) 181.4 kg    Examination: General exam: Conversant, in no acute distress Respiratory system: normal chest rise, clear, no audible wheezing Cardiovascular system: regular rhythm, s1-s2 Gastrointestinal system: Nondistended, nontender, pos BS Central nervous system: No seizures, no tremors Extremities: No cyanosis, no joint deformities Skin: No rashes, no pallor Psychiatry: Affect normal // no auditory hallucinations   Data Reviewed: I have personally reviewed following labs and imaging studies  CBC: No results for input(s): WBC, NEUTROABS, HGB, HCT, MCV, PLT in the last 168 hours.  Basic Metabolic Panel: Recent Labs  Lab 11/17/20 0921 11/18/20 0737 11/19/20 0837  NA 137 135 135  K 3.0* 3.4* 3.4*  CL 103 101 101  CO2 27 25 25   GLUCOSE 91 97 93  BUN <5* <5* <5*  CREATININE 0.59 0.57 0.52  CALCIUM 8.4* 8.5* 8.3*  MG  --  2.0 2.0    GFR: Estimated Creatinine Clearance: 169.5 mL/min (by C-G formula based on SCr of 0.52 mg/dL). Liver Function Tests: Recent Labs  Lab 11/14/20 1217 11/17/20 0921 11/18/20 0737 11/19/20 0837  AST 51* 49* 58* 48*  ALT 31 23 23 20   ALKPHOS 58 60 63 56  BILITOT 1.1 1.1 1.3* 1.4*  PROT 7.1 7.2 7.4 6.8  ALBUMIN 3.0* 3.0* 3.1* 2.9*    Recent Labs  Lab 11/14/20 1217  LIPASE 19    No results for input(s): AMMONIA in the last 168 hours. Coagulation Profile: No results for input(s): INR, PROTIME in the last 168 hours. Cardiac Enzymes: No results for input(s): CKTOTAL, CKMB, CKMBINDEX, TROPONINI in the last 168  hours. BNP (last 3 results) No results for input(s): PROBNP in the last 8760 hours. HbA1C: No results for input(s): HGBA1C in the last 72 hours. CBG: No results for input(s): GLUCAP in the last 168 hours. Lipid Profile: No results for input(s): CHOL, HDL, LDLCALC, TRIG, CHOLHDL, LDLDIRECT in the last 72 hours. Thyroid Function Tests: No results for input(s): TSH, T4TOTAL, FREET4, T3FREE, THYROIDAB in the last 72 hours. Anemia Panel: No results for input(s): VITAMINB12, FOLATE, FERRITIN, TIBC, IRON, RETICCTPCT in the last 72 hours. Sepsis Labs: No results for input(s): PROCALCITON, LATICACIDVEN in the last 168 hours.  Recent Results (from the past 240 hour(s))  Urine Culture     Status: Abnormal   Collection Time: 11/10/20  4:47 PM   Specimen: Urine, Catheterized  Result Value Ref Range Status   Specimen Description   Final    URINE, CATHETERIZED Performed at [facility]  [facility], [address] [place], [place] [address]    Special Requests   Final    NONE Performed at [facility], [address] [place], [place] [address]    Culture >=100,000 COLONIES/mL PROTEUS MIRABILIS (A)  Final   Report Status 11/13/2020 FINAL  Final   Organism ID, Bacteria PROTEUS MIRABILIS (A)  Final      Susceptibility   Proteus mirabilis - MIC*    AMPICILLIN <=2 SENSITIVE Sensitive     CEFAZOLIN <=4 SENSITIVE Sensitive     CEFEPIME <=0.12 SENSITIVE Sensitive     CEFTRIAXONE <=0.25 SENSITIVE Sensitive     CIPROFLOXACIN <=0.25 SENSITIVE Sensitive     GENTAMICIN <=1 SENSITIVE Sensitive     IMIPENEM 2 SENSITIVE Sensitive     NITROFURANTOIN 128 RESISTANT Resistant     TRIMETH/SULFA <=20 SENSITIVE Sensitive     AMPICILLIN/SULBACTAM <=2 SENSITIVE Sensitive     PIP/TAZO <=4 SENSITIVE Sensitive     * >=100,000 COLONIES/mL PROTEUS MIRABILIS      Radiology Studies: VAS US LOWER EXTREMITY VENOUS (DVT)  Result Date: 11/18/2020  Lower Venous DVT Study Patient Name:   Carla Little  Date of Exam:   11/18/2020 Medical Rec #: [id number]          Accession #:    [id number] Date of Birth: Feb 16, 1991         Patient Gender: F Patient Age:   [age] years Exam [facility] Procedure:      VAS US LOWER EXTREMITY VENOUS (DVT) Referring Phys: Amaani Guilbault --------------------------------------------------------------------------------  Indications: Pain.  Risk Factors: Trauma. Limitations: Body habitus and poor ultrasound/tissue interface. Comparison Study: No prior studies. Performing Technologist: Chanda BusingGregory Collins RVT  Examination Guidelines: A complete evaluation includes B-mode imaging, spectral Doppler, color Doppler, and power Doppler as needed of all accessible portions of each vessel. Bilateral testing is considered an integral part of a complete examination. Limited examinations for reoccurring indications may be performed as noted. The reflux portion of the exam is performed with the patient in reverse Trendelenburg.  +-----+---------------+---------+-----------+----------+--------------+ RIGHTCompressibilityPhasicitySpontaneityPropertiesThrombus Aging +-----+---------------+---------+-----------+----------+--------------+ CFV  Full           Yes      Yes                                 +-----+---------------+---------+-----------+----------+--------------+   +---------+---------------+---------+-----------+----------+-------------------+ LEFT     CompressibilityPhasicitySpontaneityPropertiesThrombus Aging      +---------+---------------+---------+-----------+----------+-------------------+ CFV      Full           Yes      Yes                                      +---------+---------------+---------+-----------+----------+-------------------+ SFJ      Full                                                             +---------+---------------+---------+-----------+----------+-------------------+ FV Prox  Full                                                              +---------+---------------+---------+-----------+----------+-------------------+  FV Mid   Full                                                             +---------+---------------+---------+-----------+----------+-------------------+ FV Distal               Yes      Yes                                      +---------+---------------+---------+-----------+----------+-------------------+ PFV      Full                                                             +---------+---------------+---------+-----------+----------+-------------------+ POP      Full           Yes      Yes                                      +---------+---------------+---------+-----------+----------+-------------------+ PTV      Full                                                             +---------+---------------+---------+-----------+----------+-------------------+ PERO                                                  Not well visualized +---------+---------------+---------+-----------+----------+-------------------+    Summary: RIGHT: - No evidence of common femoral vein obstruction.  LEFT: - There is no evidence of deep vein thrombosis in the lower extremity. However, portions of this examination were limited- see technologist comments above.  - No cystic structure found in the popliteal fossa.  *See table(s) above for measurements and observations. Electronically signed by Sherald Hess MD on 11/18/2020 at 2:43:02 PM.    Final    DG ESOPHAGUS W SINGLE CM (SOL OR THIN BA)  Result Date: 11/18/2020 CLINICAL DATA:  Emesis with attempted taking of pills. Nausea. Gagging. EXAM: ESOPHOGRAM/BARIUM SWALLOW TECHNIQUE: Single contrast examination was performed using  thin barium. FLUOROSCOPY TIME:  Fluoroscopy Time:   minute and 0 seconds Radiation Exposure Index (if provided by the fluoroscopic device): 23.4 mGy Number of Acquired Spot Images: 0  COMPARISON:  11/03/2020 CTA chest FINDINGS: An attempted focused single-contrast exam was initiated. The patient was placed in LPO position, as secondary to immobility and morbid obesity, she could not stand upright or be placed in RAO position. With attempted single swallow, no gross narrowing or stricture is identified. Prior to contrast traversing the distal esophagus, the patient exhibited emesis. A second attempt was performed, with immediate emesis as well. IMPRESSION: Extremely limited exam, secondary to episodes of emesis with  individual swallows. No gross esophageal stricture identified to the level of the lower thoracic esophagus. Electronically Signed   By: Jeronimo Greaves M.D.   On: 11/18/2020 12:12    Scheduled Meds:  acetaminophen  1,000 mg Oral TID   cholecalciferol  2,000 Units Oral Daily   enoxaparin (LOVENOX) injection  0.5 mg/kg Subcutaneous Q24H   mouth rinse  15 mL Mouth Rinse BID   metoCLOPramide (REGLAN) injection  10 mg Intravenous Q8H   metoprolol tartrate  12.5 mg Oral BID   milk and molasses  1 enema Rectal Once   pantoprazole  40 mg Oral QAC breakfast   polyethylene glycol  17 g Oral BID   senna-docusate  2 tablet Oral BID   Continuous Infusions:  sodium chloride Stopped (11/18/20 1800)     LOS: 18 days   Rickey Barbara, MD [facility] Pager On Amion  If 7PM-7AM, please contact night-coverage 11/19/2020, 3:27 PM

## 2020-11-19 NOTE — Transfer of Care (Signed)
Immediate Anesthesia Transfer of Care Note  Patient: Carla Little  Procedure(s) Performed: ESOPHAGOGASTRODUODENOSCOPY (EGD) WITH PROPOFOL BIOPSY  Patient Location: PACU  Anesthesia Type:MAC  Level of Consciousness: awake  Airway & Oxygen Therapy: Patient Spontanous Breathing and Patient connected to face mask  Post-op Assessment: Report given to RN and Post -op Vital signs reviewed and stable  Post vital signs: Reviewed and stable  Last Vitals:  Vitals Value Taken Time  BP    Temp    Pulse    Resp    SpO2      Last Pain:  Vitals:   11/19/20 1020  TempSrc: Temporal  PainSc: 0-No pain      Patients Stated Pain Goal: 3 (83/29/19 1660)  Complications: No notable events documented.

## 2020-11-19 NOTE — Anesthesia Postprocedure Evaluation (Signed)
Anesthesia Post Note  Patient: Carla Little  Procedure(s) Performed: ESOPHAGOGASTRODUODENOSCOPY (EGD) WITH PROPOFOL BIOPSY     Patient location during evaluation: PACU Anesthesia Type: MAC Level of consciousness: awake and alert Pain management: pain level controlled Vital Signs Assessment: post-procedure vital signs reviewed and stable Respiratory status: spontaneous breathing and respiratory function stable Cardiovascular status: stable Postop Assessment: no apparent nausea or vomiting Anesthetic complications: no   No notable events documented.  Last Vitals:  Vitals:   11/19/20 1100 11/19/20 1110  BP: 114/71 124/65  Pulse: (!) 110 (!) 103  Resp: (!) 24 (!) 26  Temp:    SpO2: 92% 100%                 Kaylana Fenstermacher DANIEL

## 2020-11-20 LAB — COMPREHENSIVE METABOLIC PANEL
ALT: 20 U/L (ref 0–44)
AST: 49 U/L — ABNORMAL HIGH (ref 15–41)
Albumin: 2.9 g/dL — ABNORMAL LOW (ref 3.5–5.0)
Alkaline Phosphatase: 53 U/L (ref 38–126)
Anion gap: 8 (ref 5–15)
BUN: <5 mg/dL — ABNORMAL LOW (ref 6–20)
CO2: 27 mmol/L (ref 22–32)
Calcium: 8.7 mg/dL — ABNORMAL LOW (ref 8.9–10.3)
Chloride: 105 mmol/L (ref 98–111)
Creatinine, Ser: 0.58 mg/dL (ref 0.44–1.00)
GFR, Estimated: >60 mL/min (ref >60)
Glucose, Bld: 90 mg/dL (ref 70–99)
Potassium: 3.6 mmol/L (ref 3.5–5.1)
Sodium: 140 mmol/L (ref 135–145)
Total Bilirubin: 1.1 mg/dL (ref 0.3–1.2)
Total Protein: 6.8 g/dL (ref 6.5–8.1)

## 2020-11-20 LAB — CBC
HCT: 27.8 % — ABNORMAL LOW (ref 36.0–46.0)
Hemoglobin: 8.3 g/dL — ABNORMAL LOW (ref 12.0–15.0)
MCH: 26.8 pg (ref 26.0–34.0)
MCHC: 29.9 g/dL — ABNORMAL LOW (ref 30.0–36.0)
MCV: 89.7 fL (ref 80.0–100.0)
Platelets: 428 10*3/uL — ABNORMAL HIGH (ref 150–400)
RBC: 3.1 MIL/uL — ABNORMAL LOW (ref 3.87–5.11)
RDW: 30.2 % — ABNORMAL HIGH (ref 11.5–15.5)
WBC: 5.5 10*3/uL (ref 4.0–10.5)
nRBC: 0 % (ref 0.0–0.2)

## 2020-11-20 MED ORDER — PANTOPRAZOLE SODIUM 40 MG IV SOLR
40.0000 mg | Freq: Every day | INTRAVENOUS | Status: DC
  Administered 2020-11-20 – 2020-11-26 (×7): 40 mg via INTRAVENOUS
  Filled 2020-11-20 (×7): qty 40

## 2020-11-20 NOTE — Progress Notes (Signed)
PROGRESS NOTE    Carla Little  LOV:[id number] DOB: [birth year] DOA: 10/31/2020 PCP: Patient, No Pcp Per (Inactive)    Brief Narrative:  30 year old female with history of morbid obesity, anxiety/depression, who felt a pop in the left knee when ambulating and suddenly experience instability with resultant mechanical fall.  EMS was called and she was found to have dislocated her knee.  This was recently reduced in the ED, MRI revealed a complete ACL tear, PCL tear and MCL strain as well as large effusion.  Orthopedic surgery was consulted and is managing with brace.  No plan for surgery.  Therapy recommended skilled nursing facility.  Assessment & Plan:   Active Problems:   Left knee dislocation   Knee dislocation, left, initial encounter   Left knee dislocation -ACL and PCL tear/avulsion fracture involving the head of fibula/patellar dislocation/MCL sprain -Orthopedics were consulted -Bledsoe hinged knee brace with serial exams to ensure that knee remains in place -Continue with analgesia as tolerated   Intractable nausea/vomiting -Initially noted to have improved after starting  pepcid 20 mg iv q 12 hr and reglan  10 mg iv q 8 hr -KUB was noted to be unremarkable -Continues to vomit, describes intolerance to solids but tolerates liquids -attempted esophagram however pt vomited the contrast, thus was an incomplete study -Urine pregnancy test was negative -Appreciate assistance by gastroenterology.  Patient is now status post EGD with findings of gastritis as well as a small superficial ulcer. -Abdominal ultrasound reviewed, unremarkable -GI recommendation to limit narcotics if possible   UTI -Complain of dysuria, UA was abnormal.   -Urine culture grew Proteus mirabilis, sensitive to ceftriaxone  -She was empirically started on ceftriaxone, switched to IV Ancef.   -Pt completed 7 days of abx   Persistent sinus tachycardia -D-dimer was elevated; CTA chest was negative for  PE -Continue with metoprolol 12.5 mg p.o. twice daily   Acute urinary retention -Resolved -Continue Urecholine   Acute hypoxemic respiratory failure -Concern for undiagnosed OHS/OSA -Patient would greatly benefit from sleep study outpatient   Iron-deficiency anemia -Ferritin low at 11, iron saturation 6% -Pt given IV iron on 8/12 -hold p.o. iron supplementation for nausea/vomiting.   Hyponatremia -Noted to have resolved   Thrombocytosis -Likely reactive   Euthyroid sick syndrome -TSH 11.895, T4 0.99 -Check TSH in 4 to 6 weeks as outpatient   Constipation/fecal impaction -Noted to have resolved. Pt reports bowel movements -Started having liquid stools now -She did get milk of molasses enema yesterday -She also got milk of magnesia  30 cc p.o. x1 -Continue Senokot S tablet twice a day, MiraLAX 17 g p.o. twice daily   DVT prophylaxis: Lovenox subq Code Status: Full Family Communication: Pt in room, family currently not at bedside  Status is: Inpatient  Remains inpatient appropriate because:Inpatient level of care appropriate due to severity of illness  Dispo: The patient is from: Home              Anticipated d/c is to: Home              Patient currently is not medically stable to d/c.   Difficult to place patient No  Consultants:  Orthopedics GI  Procedures:  EGD 11/19/2020  Antimicrobials: Anti-infectives (From admission, onward)    Start     Dose/Rate Route Frequency Ordered Stop   11/13/20 1400  ceFAZolin (ANCEF) IVPB 1 g/50 mL premix        1 g 100 mL/hr over 30 Minutes Intravenous Every 8  hours 11/13/20 1153 11/17/20 2240   11/11/20 1130  cefTRIAXone (ROCEPHIN) 1 g in sodium chloride 0.9 % 100 mL IVPB  Status:  Discontinued        1 g 200 mL/hr over 30 Minutes Intravenous Every 24 hours 11/11/20 1033 11/13/20 1153       Subjective: Still complaining of nausea, patient did tolerate salmon dinner last night  Objective: Vitals:   11/19/20 1952  11/20/20 0416 11/20/20 0910 11/20/20 1231  BP: 121/74 (!) 93/56 109/66 121/78  Pulse: (!) 105 92 93 (!) 110  Resp: 19 19  18   Temp: 97.9 F (36.6 C) 98 F (36.7 C)  97.8 F (36.6 C)  TempSrc: Oral Oral  Oral  SpO2: 99% 94%  100%  Weight:      Height:       No intake or output data in the 24 hours ending 11/20/20 1634  Filed Weights   11/01/20 0026  Weight: (!) 181.4 kg    Examination: General exam: Awake, laying in bed, in nad Respiratory system: Normal respiratory effort, no wheezing Cardiovascular system: regular rate, s1, s2 Gastrointestinal system: Soft, nondistended, positive BS Central nervous system: CN2-12 grossly intact, strength intact Extremities: Perfused, no clubbing Skin: Normal skin turgor, no notable skin lesions seen Psychiatry: Mood normal // no visual hallucinations   Data Reviewed: I have personally reviewed following labs and imaging studies  CBC: Recent Labs  Lab 11/20/20 0435  WBC 5.5  HGB 8.3*  HCT 27.8*  MCV 89.7  PLT 428*    Basic Metabolic Panel: Recent Labs  Lab 11/17/20 0921 11/18/20 0737 11/19/20 0837 11/20/20 0826  NA 137 135 135 140  K 3.0* 3.4* 3.4* 3.6  CL 103 101 101 105  CO2 27 25 25 27   GLUCOSE 91 97 93 90  BUN <5* <5* <5* <5*  CREATININE 0.59 0.57 0.52 0.58  CALCIUM 8.4* 8.5* 8.3* 8.7*  MG  --  2.0 2.0  --     GFR: Estimated Creatinine Clearance: 169.5 mL/min (by C-G formula based on SCr of 0.58 mg/dL). Liver Function Tests: Recent Labs  Lab 11/14/20 1217 11/17/20 0921 11/18/20 0737 11/19/20 0837 11/20/20 0826  AST 51* 49* 58* 48* 49*  ALT 31 23 23 20 20   ALKPHOS 58 60 63 56 53  BILITOT 1.1 1.1 1.3* 1.4* 1.1  PROT 7.1 7.2 7.4 6.8 6.8  ALBUMIN 3.0* 3.0* 3.1* 2.9* 2.9*    Recent Labs  Lab 11/14/20 1217  LIPASE 19    No results for input(s): AMMONIA in the last 168 hours. Coagulation Profile: No results for input(s): INR, PROTIME in the last 168 hours. Cardiac Enzymes: No results for input(s):  CKTOTAL, CKMB, CKMBINDEX, TROPONINI in the last 168 hours. BNP (last 3 results) No results for input(s): PROBNP in the last 8760 hours. HbA1C: No results for input(s): HGBA1C in the last 72 hours. CBG: No results for input(s): GLUCAP in the last 168 hours. Lipid Profile: No results for input(s): CHOL, HDL, LDLCALC, TRIG, CHOLHDL, LDLDIRECT in the last 72 hours. Thyroid Function Tests: No results for input(s): TSH, T4TOTAL, FREET4, T3FREE, THYROIDAB in the last 72 hours. Anemia Panel: No results for input(s): VITAMINB12, FOLATE, FERRITIN, TIBC, IRON, RETICCTPCT in the last 72 hours. Sepsis Labs: No results for input(s): PROCALCITON, LATICACIDVEN in the last 168 hours.  Recent Results (from the past 240 hour(s))  Urine Culture     Status: Abnormal   Collection Time: 11/10/20  4:47 PM   Specimen: Urine, Catheterized  Result  Value Ref Range Status   Specimen Description   Final    URINE, CATHETERIZED Performed at [facility], [address] [place], [place] [address]    Special Requests   Final    NONE Performed at [facility], [address], [place], [place] [address]    Culture >=100,000 COLONIES/mL PROTEUS MIRABILIS (A)  Final   Report Status 11/13/2020 FINAL  Final   Organism ID, Bacteria PROTEUS MIRABILIS (A)  Final      Susceptibility   Proteus mirabilis - MIC*    AMPICILLIN <=2 SENSITIVE Sensitive     CEFAZOLIN <=4 SENSITIVE Sensitive     CEFEPIME <=0.12 SENSITIVE Sensitive     CEFTRIAXONE <=0.25 SENSITIVE Sensitive     CIPROFLOXACIN <=0.25 SENSITIVE Sensitive     GENTAMICIN <=1 SENSITIVE Sensitive     IMIPENEM 2 SENSITIVE Sensitive     NITROFURANTOIN 128 RESISTANT Resistant     TRIMETH/SULFA <=20 SENSITIVE Sensitive     AMPICILLIN/SULBACTAM <=2 SENSITIVE Sensitive     PIP/TAZO <=4 SENSITIVE Sensitive     * >=100,000 COLONIES/mL PROTEUS MIRABILIS      Radiology Studies: US Abdomen Limited RUQ (LIVER/GB)  Result Date:  11/20/2020 CLINICAL DATA:  Cholelithiasis. EXAM: ULTRASOUND ABDOMEN LIMITED RIGHT UPPER QUADRANT COMPARISON:  None. FINDINGS: Gallbladder: No gallstones or wall thickening visualized (2.5 mm). No sonographic Murphy sign noted by sonographer. Common bile duct: Diameter: 5.1 mm Liver: No focal lesion identified. Within normal limits in parenchymal echogenicity. Portal vein is patent on color Doppler imaging with normal direction of blood flow towards the liver. Other: None. IMPRESSION: Normal right upper quadrant ultrasound. Electronically Signed   By: Aram Candela M.D.   On: 11/20/2020 00:54    Scheduled Meds:  acetaminophen  1,000 mg Oral TID   cholecalciferol  2,000 Units Oral Daily   enoxaparin (LOVENOX) injection  0.5 mg/kg Subcutaneous Q24H   mouth rinse  15 mL Mouth Rinse BID   metoCLOPramide (REGLAN) injection  10 mg Intravenous Q8H   metoprolol tartrate  12.5 mg Oral BID   milk and molasses  1 enema Rectal Once   pantoprazole (PROTONIX) IV  40 mg Intravenous Daily   polyethylene glycol  17 g Oral BID   senna-docusate  2 tablet Oral BID   Continuous Infusions:  sodium chloride Stopped (11/18/20 1800)     LOS: 19 days   Rickey Barbara, MD [facility] Pager On Amion  If 7PM-7AM, please contact night-coverage 11/20/2020, 4:34 PM

## 2020-11-20 NOTE — Progress Notes (Signed)
PT Cancellation Note  Patient Details Name: Carla Little MRN: [id number] DOB: 1990-07-20   Cancelled Treatment:     PT attempted this am but deferred at request of nursing 2* pt nausea.  Will follow.   Gurshan Settlemire 11/20/2020, 12:05 PM

## 2020-11-20 NOTE — Progress Notes (Signed)
UNASSIGNED PATIENT Subjective: Carla Little is a 30 year old black female with injury to her left knee who has been having nausea and vomiting.  She says she had a salmon dinner last night and was able to give her Toradol but vomited her meds this morning.  Patient seems to be very comfortably laying in bed in no acute distress.  Right upper quadrant ultrasound that was unrevealing.  EGD done yesterday showed a small antral ulcer with LA grade B esophagitis for which she is on PPI's. She tells me she was working as a Psychologist, occupationalcustomer service rep with [employer] but least recently lost her job due to her medical problems.  She claims she has no family here and lives alone.  Objective: Vital signs in last 24 hours: Temp:  [97.5 F (36.4 C)-98.5 F (36.9 C)] 98 F (36.7 C) (08/28 0416) Pulse Rate:  [92-110] 93 (08/28 0910) Resp:  [16-26] 19 (08/28 0416) BP: (93-124)/(56-74) 109/66 (08/28 0910) SpO2:  [92 %-100 %] 94 % (08/28 0416) Last BM Date: 11/20/20  Intake/Output from previous day: 08/27 0701 - 08/28 0700 In: 800 [I.V.:800] Out: -  Intake/Output this shift: No intake/output data recorded.  General appearance: alert, cooperative, no distress, and morbidly obese Resp: clear to auscultation bilaterally Cardio: regular rate and rhythm, S1, S2 normal, no murmur, click, rub or gallop GI: soft, non-tender; bowel sounds normal; no masses,  no organomegaly  Lab Results: Recent Labs    11/20/20 0435  WBC 5.5  HGB 8.3*  HCT 27.8*  PLT 428*   BMET Recent Labs    11/18/20 0737 11/19/20 0837  NA 135 135  K 3.4* 3.4*  CL 101 101  CO2 25 25  GLUCOSE 97 93  BUN <5* <5*  CREATININE 0.57 0.52  CALCIUM 8.5* 8.3*   LFT Recent Labs    11/19/20 0837  PROT 6.8  ALBUMIN 2.9*  AST 48*  ALT 20  ALKPHOS 56  BILITOT 1.4*   PT/INR No results for input(s): LABPROT, INR in the last 72 hours. Hepatitis Panel No results for input(s): HEPBSAG, HCVAB, HEPAIGM, HEPBIGM in the last 72  hours. C-Diff No results for input(s): CDIFFTOX in the last 72 hours. No results for input(s): CDIFFPCR in the last 72 hours. Fecal Lactopherrin No results for input(s): FECLLACTOFRN in the last 72 hours.  Studies/Results: VAS US LOWER EXTREMITY VENOUS (DVT)  Result Date: 11/18/2020  Lower Venous DVT Study Patient Name:  Carla Little  Date of Exam:   11/18/2020 Medical Rec #: [id number]          Accession #:    [id number] Date of Birth: 1990-11-15         Patient Gender: F Patient Age:   [age] years [facility] Procedure:      VAS US LOWER EXTREMITY VENOUS (DVT) Referring Phys: STEPHEN CHIU --------------------------------------------------------------------------------  Indications: Pain.  Risk Factors: Trauma. Limitations: Body habitus and poor ultrasound/tissue interface. Comparison Study: No prior studies. Performing Technologist: Chanda BusingGregory Collins RVT  Examination Guidelines: A complete evaluation includes B-mode imaging, spectral Doppler, color Doppler, and power Doppler as needed of all accessible portions of each vessel. Bilateral testing is considered an integral part of a complete examination. Limited examinations for reoccurring indications may be performed as noted. The reflux portion of the exam is performed with the patient in reverse Trendelenburg.  +-----+---------------+---------+-----------+----------+--------------+ RIGHTCompressibilityPhasicitySpontaneityPropertiesThrombus Aging +-----+---------------+---------+-----------+----------+--------------+ CFV  Full           Yes      Yes                                 +-----+---------------+---------+-----------+----------+--------------+   +---------+---------------+---------+-----------+----------+-------------------+  LEFT     CompressibilityPhasicitySpontaneityPropertiesThrombus Aging      +---------+---------------+---------+-----------+----------+-------------------+ CFV      Full            Yes      Yes                                      +---------+---------------+---------+-----------+----------+-------------------+ SFJ      Full                                                             +---------+---------------+---------+-----------+----------+-------------------+ FV Prox  Full                                                             +---------+---------------+---------+-----------+----------+-------------------+ FV Mid   Full                                                             +---------+---------------+---------+-----------+----------+-------------------+ FV Distal               Yes      Yes                                      +---------+---------------+---------+-----------+----------+-------------------+ PFV      Full                                                             +---------+---------------+---------+-----------+----------+-------------------+ POP      Full           Yes      Yes                                      +---------+---------------+---------+-----------+----------+-------------------+ PTV      Full                                                             +---------+---------------+---------+-----------+----------+-------------------+ PERO                                                  Not well visualized +---------+---------------+---------+-----------+----------+-------------------+    Summary: RIGHT: - No evidence of common femoral vein  obstruction.  LEFT: - There is no evidence of deep vein thrombosis in the lower extremity. However, portions of this examination were limited- see technologist comments above.  - No cystic structure found in the popliteal fossa.  *See table(s) above for measurements and observations. Electronically signed by Sherald Hess MD on 11/18/2020 at 2:43:02 PM.    Final    US Abdomen Limited RUQ (LIVER/GB)  Result Date: 11/20/2020 CLINICAL DATA:   Cholelithiasis. EXAM: ULTRASOUND ABDOMEN LIMITED RIGHT UPPER QUADRANT COMPARISON:  None. FINDINGS: Gallbladder: No gallstones or wall thickening visualized (2.5 mm). No sonographic Murphy sign noted by sonographer. Common bile duct: Diameter: 5.1 mm Liver: No focal lesion identified. Within normal limits in parenchymal echogenicity. Portal vein is patent on color Doppler imaging with normal direction of blood flow towards the liver. Other: None. IMPRESSION: Normal right upper quadrant ultrasound. Electronically Signed   By: Aram Candela M.D.   On: 11/20/2020 00:54   DG ESOPHAGUS W SINGLE CM (SOL OR THIN BA)  Result Date: 11/18/2020 CLINICAL DATA:  Emesis with attempted taking of pills. Nausea. Gagging. EXAM: ESOPHOGRAM/BARIUM SWALLOW TECHNIQUE: Single contrast examination was performed using  thin barium. FLUOROSCOPY TIME:  Fluoroscopy Time:   minute and 0 seconds Radiation Exposure Index (if provided by the fluoroscopic device): 23.4 mGy Number of Acquired Spot Images: 0 COMPARISON:  11/03/2020 CTA chest FINDINGS: An attempted focused single-contrast exam was initiated. The patient was placed in LPO position, as secondary to immobility and morbid obesity, she could not stand upright or be placed in RAO position. With attempted single swallow, no gross narrowing or stricture is identified. Prior to contrast traversing the distal esophagus, the patient exhibited emesis. A second attempt was performed, with immediate emesis as well. IMPRESSION: Extremely limited exam, secondary to episodes of emesis with individual swallows. No gross esophageal stricture identified to the level of the lower thoracic esophagus. Electronically Signed   By: Jeronimo Greaves M.D.   On: 11/18/2020 12:12    Medications: I have reviewed the patient's current medications. Prior to Admission:  Medications Prior to Admission  Medication Sig Dispense Refill Last Dose   ibuprofen (ADVIL,MOTRIN) 200 MG tablet Take 400 mg by mouth 2  (two) times daily as needed for headache, moderate pain or cramping.    Past Week   Scheduled:  acetaminophen  1,000 mg Oral TID   cholecalciferol  2,000 Units Oral Daily   enoxaparin (LOVENOX) injection  0.5 mg/kg Subcutaneous Q24H   mouth rinse  15 mL Mouth Rinse BID   metoCLOPramide (REGLAN) injection  10 mg Intravenous Q8H   metoprolol tartrate  12.5 mg Oral BID   milk and molasses  1 enema Rectal Once   pantoprazole (PROTONIX) IV  40 mg Intravenous Daily   polyethylene glycol  17 g Oral BID   senna-docusate  2 tablet Oral BID   Continuous:  sodium chloride Stopped (11/18/20 1800)   Assessment/Plan: 1) Nausea and vomiting-etiology unclear. Patient was able to keep her meal down yesterday. Right upper quadrant ultrasound is unrevealing. On Metoclopramide and Protonix. 2) left knee dislocation with ACL and PCL tear/avulsion fracture involving the head of the tibia/patellar dislocation/MCL sprain.  We need to minimize the use of narcotic as these will worsen her constipation and worsen her nausea and vomiting.  She is refusing physical therapy and is not more mobilizing out of bed. 3) Chronic constipation on Dulcolax suppositories, Senna and MiraLAX. 4) Iron deficiency anemia-it may be helpful to check a celiac panel. 5) Persistent tachycardia on  Metoprolol. 6) Acute urinary retention-improved.   LOS: 19 days   Charna Elizabeth 11/20/2020, 9:32 AM

## 2020-11-21 ENCOUNTER — Inpatient Hospital Stay (HOSPITAL_COMMUNITY): Payer: Medicaid Other

## 2020-11-21 ENCOUNTER — Encounter (HOSPITAL_COMMUNITY): Payer: Self-pay | Admitting: Gastroenterology

## 2020-11-21 DIAGNOSIS — L8990 Pressure ulcer of unspecified site, unspecified stage: Secondary | ICD-10-CM | POA: Insufficient documentation

## 2020-11-21 LAB — COMPREHENSIVE METABOLIC PANEL
ALT: 22 U/L (ref 0–44)
AST: 57 U/L — ABNORMAL HIGH (ref 15–41)
Albumin: 2.7 g/dL — ABNORMAL LOW (ref 3.5–5.0)
Alkaline Phosphatase: 51 U/L (ref 38–126)
Anion gap: 3 — ABNORMAL LOW (ref 5–15)
BUN: <5 mg/dL — ABNORMAL LOW (ref 6–20)
CO2: 29 mmol/L (ref 22–32)
Calcium: 8.7 mg/dL — ABNORMAL LOW (ref 8.9–10.3)
Chloride: 107 mmol/L (ref 98–111)
Creatinine, Ser: 0.57 mg/dL (ref 0.44–1.00)
GFR, Estimated: >60 mL/min (ref >60)
Glucose, Bld: 79 mg/dL (ref 70–99)
Potassium: 3.6 mmol/L (ref 3.5–5.1)
Sodium: 139 mmol/L (ref 135–145)
Total Bilirubin: 1 mg/dL (ref 0.3–1.2)
Total Protein: 6.4 g/dL — ABNORMAL LOW (ref 6.5–8.1)

## 2020-11-21 LAB — MAGNESIUM: Magnesium: 1.9 mg/dL (ref 1.7–2.4)

## 2020-11-21 MED ORDER — SUCRALFATE 1 G PO TABS: 1.0000 g | ORAL_TABLET | Freq: Three times a day (TID) | ORAL | Status: DC

## 2020-11-21 MED ORDER — SUCRALFATE 1 GM/10ML PO SUSP
1.0000 g | Freq: Three times a day (TID) | ORAL | Status: DC
  Administered 2020-11-21 – 2020-11-28 (×21): 1 g via ORAL
  Filled 2020-11-21 (×24): qty 10

## 2020-11-21 NOTE — Progress Notes (Signed)
90 PROGRESS NOTE    RILEA ARUTYUNYAN  NKN:[id number] DOB: [birth year] DOA: 10/31/2020 PCP: Patient, No Pcp Per (Inactive)    Brief Narrative:  30 year old female with history of morbid obesity, anxiety/depression, who felt a pop in the left knee when ambulating and suddenly experience instability with resultant mechanical fall.  EMS was called and she was found to have dislocated her knee.  This was recently reduced in the ED, MRI revealed a complete ACL tear, PCL tear and MCL strain as well as large effusion.  Orthopedic surgery was consulted and is managing with brace.  No plan for surgery.  Therapy recommended skilled nursing facility.  Assessment & Plan:   Active Problems:   Left knee dislocation   Knee dislocation, left, initial encounter   Pressure injury of skin   Left knee dislocation -ACL and PCL tear/avulsion fracture involving the head of fibula/patellar dislocation/MCL sprain -Orthopedics were consulted -Bledsoe hinged knee brace with serial exams to ensure that knee remains in place -Continue with analgesia as tolerated   Intractable nausea/vomiting -Initially noted to have improved after starting  pepcid 20 mg iv q 12 hr and reglan  10 mg iv q 8 hr -KUB was noted to be unremarkable -Recently noted to have intolerance to solids but tolerates liquids -attempted esophagram however pt vomited the contrast, thus was an incomplete study -Urine pregnancy test was negative -Appreciate assistance by gastroenterology.  Patient is now status post With findings of gastritis as well as a small superficial ulcer. -Abdominal ultrasound reviewed, unremarkable -GI recommendation to limit narcotics if possible -Today, pt reports less nausea, tolerated potato chips. Plan to advance diet as tolerated   UTI -Complain of dysuria, UA was abnormal.   -Urine culture grew Proteus mirabilis, sensitive to ceftriaxone  -She was empirically started on ceftriaxone, switched to IV Ancef.   -Pt  completed 7 days of abx   Persistent sinus tachycardia -D-dimer was elevated; CTA chest was negative for PE -Continue with metoprolol 12.5 mg p.o. twice daily   Acute urinary retention -Resolved -Continue Urecholine   Acute hypoxemic respiratory failure -Concern for undiagnosed OHS/OSA -Patient would greatly benefit from sleep study outpatient   Iron-deficiency anemia -Ferritin low at 11, iron saturation 6% -Pt given IV iron on 8/12 -hold p.o. iron supplementation for nausea/vomiting.   Hyponatremia -Noted to have resolved   Thrombocytosis -Likely reactive   Euthyroid sick syndrome -TSH 11.895, T4 0.99 -Recommend repeat TSH in 4 to 6 weeks as outpatient   Constipation/fecal impaction -Noted to have resolved. Pt reports bowel movements -Started having liquid stools now -She did get milk of molasses enema yesterday -She also got milk of magnesia  30 cc p.o. x1 -Continue Senokot S tablet twice a day, MiraLAX 17 g p.o. twice daily  Class III Morbid obesity -calculated BMI of 69.11 -Would benefit from gastric bypass down the road   DVT prophylaxis: Lovenox subq Code Status: Full Family Communication: Pt in room, family currently not at bedside  Status is: Inpatient  Remains inpatient appropriate because:Inpatient level of care appropriate due to severity of illness  Dispo: The patient is from: Home              Anticipated d/c is to: Home              Patient currently is not medically stable to d/c.   Difficult to place patient No  Consultants:  Orthopedics GI  Procedures:  EGD 11/19/2020  Antimicrobials: Anti-infectives (From admission, onward)    Start  Dose/Rate Route Frequency Ordered Stop   11/13/20 1400  ceFAZolin (ANCEF) IVPB 1 g/50 mL premix        1 g 100 mL/hr over 30 Minutes Intravenous Every 8 hours 11/13/20 1153 11/17/20 2240   11/11/20 1130  cefTRIAXone (ROCEPHIN) 1 g in sodium chloride 0.9 % 100 mL IVPB  Status:  Discontinued        1  g 200 mL/hr over 30 Minutes Intravenous Every 24 hours 11/11/20 1033 11/13/20 1153       Subjective: States nausea seems improved today. Tolerated some potato chips  Objective: Vitals:   11/20/20 1231 11/20/20 1900 11/21/20 0500 11/21/20 1307  BP: 121/78 115/65 102/62 109/78  Pulse: (!) 110 100 96 92  Resp: 18 18 18 19   Temp: 97.8 F (36.6 C) 98 F (36.7 C) 98 F (36.7 C) 98.1 F (36.7 C)  TempSrc: Oral Oral Oral Oral  SpO2: 100% 100% 100% 99%  Weight:      Height:        Intake/Output Summary (Last 24 hours) at 11/21/2020 1423 Last data filed at 11/21/2020 1057 Gross per 24 hour  Intake 426.16 ml  Output --  Net 426.16 ml    Filed Weights   11/01/20 0026  Weight: (!) 181.4 kg    Examination: General exam: Awake, laying in bed, in nad Respiratory system: Normal respiratory effort, no wheezing Cardiovascular system: regular rate, s1, s2 Gastrointestinal system: Soft, nondistended, positive BS Central nervous system: CN2-12 grossly intact, strength intact Extremities: Perfused, no clubbing Skin: Normal skin turgor, no notable skin lesions seen Psychiatry: Mood normal // no visual hallucinations   Data Reviewed: I have personally reviewed following labs and imaging studies  CBC: Recent Labs  Lab 11/20/20 0435  WBC 5.5  HGB 8.3*  HCT 27.8*  MCV 89.7  PLT 428*    Basic Metabolic Panel: Recent Labs  Lab 11/17/20 0921 11/18/20 0737 11/19/20 0837 11/20/20 0826 11/21/20 0337  NA 137 135 135 140 139  K 3.0* 3.4* 3.4* 3.6 3.6  CL 103 101 101 105 107  CO2 27 25 25 27 29   GLUCOSE 91 97 93 90 79  BUN <5* <5* <5* <5* <5*  CREATININE 0.59 0.57 0.52 0.58 0.57  CALCIUM 8.4* 8.5* 8.3* 8.7* 8.7*  MG  --  2.0 2.0  --  1.9    GFR: Estimated Creatinine Clearance: 169.5 mL/min (by C-G formula based on SCr of 0.57 mg/dL). Liver Function Tests: Recent Labs  Lab 11/17/20 0921 11/18/20 0737 11/19/20 0837 11/20/20 0826 11/21/20 0337  AST 49* 58* 48* 49* 57*   ALT 23 23 20 20 22   ALKPHOS 60 63 56 53 51  BILITOT 1.1 1.3* 1.4* 1.1 1.0  PROT 7.2 7.4 6.8 6.8 6.4*  ALBUMIN 3.0* 3.1* 2.9* 2.9* 2.7*    No results for input(s): LIPASE, AMYLASE in the last 168 hours.  No results for input(s): AMMONIA in the last 168 hours. Coagulation Profile: No results for input(s): INR, PROTIME in the last 168 hours. Cardiac Enzymes: No results for input(s): CKTOTAL, CKMB, CKMBINDEX, TROPONINI in the last 168 hours. BNP (last 3 results) No results for input(s): PROBNP in the last 8760 hours. HbA1C: No results for input(s): HGBA1C in the last 72 hours. CBG: No results for input(s): GLUCAP in the last 168 hours. Lipid Profile: No results for input(s): CHOL, HDL, LDLCALC, TRIG, CHOLHDL, LDLDIRECT in the last 72 hours. Thyroid Function Tests: No results for input(s): TSH, T4TOTAL, FREET4, T3FREE, THYROIDAB in the last  72 hours. Anemia Panel: No results for input(s): VITAMINB12, FOLATE, FERRITIN, TIBC, IRON, RETICCTPCT in the last 72 hours. Sepsis Labs: No results for input(s): PROCALCITON, LATICACIDVEN in the last 168 hours.  No results found for this or any previous visit (from the past 240 hour(s)).     Radiology Studies: US Abdomen Limited RUQ (LIVER/GB)  Result Date: 11/20/2020 CLINICAL DATA:  Cholelithiasis. EXAM: ULTRASOUND ABDOMEN LIMITED RIGHT UPPER QUADRANT COMPARISON:  None. FINDINGS: Gallbladder: No gallstones or wall thickening visualized (2.5 mm). No sonographic Murphy sign noted by sonographer. Common bile duct: Diameter: 5.1 mm Liver: No focal lesion identified. Within normal limits in parenchymal echogenicity. Portal vein is patent on color Doppler imaging with normal direction of blood flow towards the liver. Other: None. IMPRESSION: Normal right upper quadrant ultrasound. Electronically Signed   By: Aram Candela M.D.   On: 11/20/2020 00:54    Scheduled Meds:  acetaminophen  1,000 mg Oral TID   cholecalciferol  2,000 Units Oral Daily    enoxaparin (LOVENOX) injection  0.5 mg/kg Subcutaneous Q24H   mouth rinse  15 mL Mouth Rinse BID   metoCLOPramide (REGLAN) injection  10 mg Intravenous Q8H   metoprolol tartrate  12.5 mg Oral BID   milk and molasses  1 enema Rectal Once   pantoprazole (PROTONIX) IV  40 mg Intravenous Daily   polyethylene glycol  17 g Oral BID   senna-docusate  2 tablet Oral BID   sucralfate  1 g Oral TID WC & HS   Continuous Infusions:  sodium chloride 75 mL/hr at 11/21/20 1222     LOS: 20 days   Rickey Barbara, MD [facility] Pager On Amion  If 7PM-7AM, please contact night-coverage 11/21/2020, 2:23 PM

## 2020-11-21 NOTE — Progress Notes (Addendum)
UNASSIGNED PATIENT Subjective: Since I last evaluated the patient, she seems to be doing better today.  She denies having any nausea vomiting.  She is denies having any abdominal pain, melena or hematochezia.  Objective: Vital signs in last 24 hours: Temp:  [98 F (36.7 C)-98.1 F (36.7 C)] 98.1 F (36.7 C) (08/29 1307) Pulse Rate:  [92-100] 92 (08/29 1307) Resp:  [18-19] 19 (08/29 1307) BP: (102-115)/(62-78) 109/78 (08/29 1307) SpO2:  [99 %-100 %] 99 % (08/29 1307) Last BM Date: 11/20/20  Intake/Output from previous day: 08/28 0701 - 08/29 0700 In: 296.2 [I.V.:296.2] Out: -  Intake/Output this shift: Total I/O In: 370 [P.O.:360; I.V.:10] Out: -   General appearance: alert, cooperative, appears stated age, morbidly obese, and pale Resp: clear to auscultation bilaterally Cardio: regular rate and rhythm, S1, S2 normal, no murmur, click, rub or gallop GI: soft, non-tender; bowel sounds normal; no masses,  no organomegaly  Lab Results: Recent Labs    11/20/20 0435  WBC 5.5  HGB 8.3*  HCT 27.8*  PLT 428*   BMET Recent Labs    11/19/20 0837 11/20/20 0826 11/21/20 0337  NA 135 140 139  K 3.4* 3.6 3.6  CL 101 105 107  CO2 25 27 29   GLUCOSE 93 90 79  BUN <5* <5* <5*  CREATININE 0.52 0.58 0.57  CALCIUM 8.3* 8.7* 8.7*   LFT Recent Labs    11/21/20 0337  PROT 6.4*  ALBUMIN 2.7*  AST 57*  ALT 22  ALKPHOS 51  BILITOT 1.0   Studies/Results: 11/23/20 Abdomen Limited RUQ (LIVER/GB)  Result Date: 11/20/2020 CLINICAL DATA:  Cholelithiasis. EXAM: ULTRASOUND ABDOMEN LIMITED RIGHT UPPER QUADRANT COMPARISON:  None. FINDINGS: Gallbladder: No gallstones or wall thickening visualized (2.5 mm). No sonographic Murphy sign noted by sonographer. Common bile duct: Diameter: 5.1 mm Liver: No focal lesion identified. Within normal limits in parenchymal echogenicity. Portal vein is patent on color Doppler imaging with normal direction of blood flow towards the liver. Other: None.  IMPRESSION: Normal right upper quadrant ultrasound. Electronically Signed   By: 11/22/2020 M.D.   On: 11/20/2020 00:54    Medications: I have reviewed the patient's current medications. Prior to Admission:  Medications Prior to Admission  Medication Sig Dispense Refill Last Dose   ibuprofen (ADVIL,MOTRIN) 200 MG tablet Take 400 mg by mouth 2 (two) times daily as needed for headache, moderate pain or cramping.    Past Week   Scheduled:  acetaminophen  1,000 mg Oral TID   cholecalciferol  2,000 Units Oral Daily   enoxaparin (LOVENOX) injection  0.5 mg/kg Subcutaneous Q24H   mouth rinse  15 mL Mouth Rinse BID   metoCLOPramide (REGLAN) injection  10 mg Intravenous Q8H   metoprolol tartrate  12.5 mg Oral BID   milk and molasses  1 enema Rectal Once   pantoprazole (PROTONIX) IV  40 mg Intravenous Daily   polyethylene glycol  17 g Oral BID   senna-docusate  2 tablet Oral BID   sucralfate  1 g Oral TID WC & HS   Continuous:  sodium chloride 75 mL/hr at 11/21/20 1222   11/23/20, bisacodyl, diphenhydrAMINE, menthol-cetylpyridinium, methocarbamol **OR** [DISCONTINUED] methocarbamol (ROBAXIN) IV, ondansetron **OR** ondansetron (ZOFRAN) IV, oxyCODONE, oxyCODONE, sodium chloride flush, sodium phosphate  Assessment/Plan: 1) Nausea and vomiting-etiology unclear-seems to have improved. Patient was able to keep her meals down. Right upper quadrant ultrasound is unrevealing. On Metoclopramide and Protonix. 2) Left knee dislocation with ACL and PCL tear/avulsion fracture involving the head of the  tibia/patellar dislocation/MCL sprain.  We need to minimize the use of narcotic as these will worsen her constipation and worsen her nausea and vomiting.  She is refusing physical therapy and is not more mobilizing out of bed. 3) Chronic constipation on Dulcolax suppositories, Senna and MiraLAX. 4) Iron deficiency anemia-it may be helpful to check a celiac panel. 5) Persistent tachycardia on  Metoprolol. 6) Acute urinary retention-improved We will sign off now please call if further assistance is needed from a GI standpoint.  LOS: 20 days   Charna Elizabeth 11/21/2020, 5:34 PM

## 2020-11-21 NOTE — Progress Notes (Addendum)
Physical Therapy Treatment Patient Details Name: Carla Little MRN: [id number] DOB: 08-22-1990 Today's Date: 11/21/2020    History of Present Illness Carla Little is an 30 y.o.  who is morbidly obese, history of depression and got up to go the bathroom and felt a pop in her knee and had sudden instability and was unable to get up, EMS transported to the ER and found to have a knee dislocation which was reduced with fractures of the proximal fibula ligamentous avulsion laterally as well as medially off the medial femoral condyle and MRI showed complete ACL tear, PCL tear and MCL strain and has a large effusion and is going to be in knee immobilizer.  Vascular ultrasounds done and nondiagnostic. As of 8/13 she was placed in a L Bledsoe brace with WBAT orders and no flexion of knee.    PT Comments    Patient reports hearing and feeling a pop in the left lateral knee after transferring from [facility] to recliner using the RW per patient(PT not present). Patient reports 8/10 pain in the knee which by her report has not  been painful since the injury. No C/O left calf pain(Neg for DVT).  Patient reports tenderness to palpate around the  patella and knee on lateral side.  KI adjusted to position. Patient  able to stand and bear PWB with pain elicited. Patient ambulated x 5' x 2 then 14'. No increase in pain > 8/10 as reported. Unsure when if Dr. Magnus Ivan with follow up.- feel it would benefit patient to have him reassess the left knee before pushing ambulation with the " POP" felt.  Patient is significantly deconditioned for short distance ambulation ~ 14' max today with noted DOE.  PT Concerns  at Dc are that patient will be able to safely manage  short distance ambulation  inside her apartment. Patient may require use of WC also if  WC is not accessible into BR as she will require a bariatric WC.  Continue to work through safety for mobility and DME for possible DC to home. Logistics of getting  equipment delivered is an obstacle as there is no one home with her.  Patient can benefit from short rehab stay at this time to improve safety and endurance.  Patient has no local  reliable family /friends.     Follow Up Recommendations  Home health PT;SNF     Equipment Recommendations  Rolling walker with 5" wheels;3in1 (PT);Wheelchair (measurements PT);Wheelchair cushion (measurements PT) Hospital bed   Recommendations for Other Services       Precautions / Restrictions Precautions Precautions: Fall Precaution Comments: NO FLEXION LEFT KNEE, using KI right now, w Required Braces or Orthoses: Knee Immobilizer - Left  On at all times Other Brace: ok to loosen Left brace when resting. Per Dr. Lanier Clam, OK to use  KI vs [facility] . Have modified the KI and added 2 metal stays across the anterior knee(from another  KI.) Restrictions LLE Weight Bearing: Weight bearing as tolerated    Mobility  Bed Mobility               General bed mobility comments: in recliner at this time    Transfers   Equipment used: Rolling walker (2 wheeled) Transfers: Sit to/from Stand Sit to Stand: Min guard         General transfer comment: stood from recliner x 2  Ambulation/Gait Ambulation/Gait assistance: Min guard;+2 safety/equipment Gait Distance (Feet): 5 Feet (5' then 14') Assistive device: Rolling walker (2  wheeled) Gait Pattern/deviations: Step-to pattern;Wide base of support Gait velocity: decr   General Gait Details: limited by left knee pain and deconditioned/fatigue   Stairs             Wheelchair Mobility    Modified Rankin (Stroke Patients Only)       Balance Overall balance assessment: Needs assistance Sitting-balance support: No upper extremity supported;Feet supported Sitting balance-Leahy Scale: Good     Standing balance support: During functional activity Standing balance-Leahy Scale: Poor Standing balance comment: stands with UE support                             Cognition Arousal/Alertness: Awake/alert Behavior During Therapy: WFL for tasks assessed/performed                                          Exercises      General Comments        Pertinent Vitals/Pain Pain Score: 8  Pain Location: left knee on left lateral aspect of knee at about fibula head. after patient reports that she heard and felt a pop in her knee after transferred from Bed to [facility] to recliner  after sitting down. Pain Descriptors / Indicators: Discomfort;Grimacing;Guarding;Moaning Pain Intervention(s): Premedicated before session    Home Living                      Prior Function            PT Goals (current goals can now be found in the care plan section) Acute Rehab PT Goals Patient Stated Goal: to get home soon Potential to Achieve Goals: Good Progress towards PT goals: Progressing toward goals    Frequency    Min 5X/week      PT Plan Current plan remains appropriate;Other (comment);Frequency needs to be updated    Co-evaluation              AM-PAC PT "6 Clicks" Mobility   Outcome Measure  Help needed turning from your back to your side while in a flat bed without using bedrails?: None Help needed moving from lying on your back to sitting on the side of a flat bed without using bedrails?: None Help needed moving to and from a bed to a chair (including a wheelchair)?: A Little Help needed standing up from a chair using your arms (e.g., wheelchair or bedside chair)?: A Little Help needed to walk in hospital room?: A Little Help needed climbing 3-5 steps with a railing? : A Little 6 Click Score: 20    End of Session Equipment Utilized During Treatment: Gait belt Activity Tolerance: Patient limited by pain;Patient limited by fatigue Patient left: in chair;with call bell/phone within reach Nurse Communication: Mobility status PT Visit Diagnosis: Unsteadiness on feet (R26.81);History of  falling (Z91.81);Pain Pain - Right/Left: Left Pain - part of body: Knee     Time: 1401-1443 PT Time Calculation (min) (ACUTE ONLY): 42 min  Charges:  $Gait Training: 23-37 mins $Self Care/Home Management: 8-22                     Blanchard Kelch PT Acute Rehabilitation Services Pager [phone] Office [phone]    Rada Hay 11/21/2020, 3:23 PM

## 2020-11-21 NOTE — Plan of Care (Signed)
  Problem: Education: Goal: Knowledge of General Education information will improve Description: Including pain rating scale, medication(s)/side effects and non-pharmacologic comfort measures Outcome: Progressing   Problem: Clinical Measurements: Goal: Will remain free from infection Outcome: Progressing   Problem: Activity: Goal: Risk for activity intolerance will decrease Outcome: Progressing   

## 2020-11-22 DIAGNOSIS — K8000 Calculus of gallbladder with acute cholecystitis without obstruction: Secondary | ICD-10-CM

## 2020-11-22 NOTE — Progress Notes (Signed)
90 PROGRESS NOTE    Carla Little  OIB:[id number] DOB: Jul 01, 1990 DOA: 10/31/2020 PCP: Patient, No Pcp Per (Inactive)    Brief Narrative:  30 year old female with history of morbid obesity, anxiety/depression, who felt a pop in the left knee when ambulating and suddenly experience instability with resultant mechanical fall.  EMS was called and she was found to have dislocated her knee.  This was recently reduced in the ED, MRI revealed a complete ACL tear, PCL tear and MCL strain as well as large effusion.  Orthopedic surgery was consulted and is managing with brace.  No plan for surgery.  Therapy recommended skilled nursing facility.  Assessment & Plan:   Active Problems:   Left knee dislocation   Knee dislocation, left, initial encounter   Pressure injury of skin   Left knee dislocation -ACL and PCL tear/avulsion fracture involving the head of fibula/patellar dislocation/MCL sprain -Orthopedics were consulted -Bledsoe hinged knee brace with serial exams to ensure that knee remains in place -Noted a "pop" yesterday while working with PT. Seen by orthopedic surgery, f/u xray unremarkable. Cont with PT as tolerated -Continue with analgesia as tolerated   Intractable nausea/vomiting -Initially noted to have improved after starting  pepcid 20 mg iv q 12 hr and reglan  10 mg iv q 8 hr -KUB was noted to be unremarkable -Recently noted to have intolerance to solids but tolerates liquids -attempted esophagram however pt vomited the contrast, thus was an incomplete study -Appreciate assistance by gastroenterology.  Patient is now status post With findings of gastritis as well as a small superficial ulcer. -Abdominal ultrasound reviewed, unremarkable -GI recommendation to limit narcotics if possible -Patient recently noted decreased nausea, able to tolerate some p.o.  However, this morning patient noted to be nauseated again -Continue to advance diet as tolerated   UTI -Complain of  dysuria, UA was abnormal.   -Urine culture grew Proteus mirabilis, sensitive to ceftriaxone  -She was empirically started on ceftriaxone, switched to IV Ancef.   -Pt completed 7 days of abx   Persistent sinus tachycardia -D-dimer was elevated; CTA chest was negative for PE -Continue with metoprolol 12.5 mg p.o. twice daily   Acute urinary retention -Resolved -Continue Urecholine   Acute hypoxemic respiratory failure -Concern for undiagnosed OHS/OSA -Patient would greatly benefit from sleep study outpatient   Iron-deficiency anemia -Ferritin low at 11, iron saturation 6% -Pt given IV iron on 8/12 -hold p.o. iron supplementation for nausea/vomiting.   Hyponatremia -Noted to have resolved   Thrombocytosis -Likely reactive   Euthyroid sick syndrome -TSH 11.895, T4 0.99 -Recommend repeat TSH in 4 to 6 weeks as outpatient   Constipation/fecal impaction -Noted to have resolved. Pt reports bowel movements -Started having liquid stools now -She did get milk of molasses enema yesterday -She also got milk of magnesia  30 cc p.o. x1 -Continue Senokot S tablet twice a day, MiraLAX 17 g p.o. twice daily  Class III Morbid obesity -calculated BMI of 69.11 -Consider outpatient referral for gastric bypass   DVT prophylaxis: Lovenox subq Code Status: Full Family Communication: Pt in room, family currently not at bedside  Status is: Inpatient  Remains inpatient appropriate because:Inpatient level of care appropriate due to severity of illness  Dispo: The patient is from: Home              Anticipated d/c is to: Home              Patient currently is not medically stable to d/c.  Difficult to place patient No  Consultants:  Orthopedics GI  Procedures:  EGD 11/19/2020  Antimicrobials: Anti-infectives (From admission, onward)    Start     Dose/Rate Route Frequency Ordered Stop   11/13/20 1400  ceFAZolin (ANCEF) IVPB 1 g/50 mL premix        1 g 100 mL/hr over 30 Minutes  Intravenous Every 8 hours 11/13/20 1153 11/17/20 2240   11/11/20 1130  cefTRIAXone (ROCEPHIN) 1 g in sodium chloride 0.9 % 100 mL IVPB  Status:  Discontinued        1 g 200 mL/hr over 30 Minutes Intravenous Every 24 hours 11/11/20 1033 11/13/20 1153       Subjective: Reported feeling nauseated again this morning, vomited her pills  Objective: Vitals:   11/21/20 1307 11/21/20 2236 11/22/20 0415 11/22/20 1528  BP: 109/78 (!) 101/54 110/70 107/72  Pulse: 92 94 89 (!) 106  Resp: 19 15 18 20   Temp: 98.1 F (36.7 C) 98.2 F (36.8 C) 98.1 F (36.7 C) 98.5 F (36.9 C)  TempSrc: Oral Oral Oral Oral  SpO2: 99% 98% 99% 100%  Weight:      Height:       No intake or output data in the 24 hours ending 11/22/20 1604  Filed Weights   11/01/20 0026  Weight: (!) 181.4 kg    Examination: General exam: Conversant, in no acute distress Respiratory system: normal chest rise, clear, no audible wheezing Cardiovascular system: regular rhythm, s1-s2 Gastrointestinal system: Nondistended, nontender, pos BS Central nervous system: No seizures, no tremors Extremities: No cyanosis, no joint deformities Skin: No rashes, no pallor Psychiatry: Affect normal // no auditory hallucinations   Data Reviewed: I have personally reviewed following labs and imaging studies  CBC: Recent Labs  Lab 11/20/20 0435  WBC 5.5  HGB 8.3*  HCT 27.8*  MCV 89.7  PLT 428*    Basic Metabolic Panel: Recent Labs  Lab 11/17/20 0921 11/18/20 0737 11/19/20 0837 11/20/20 0826 11/21/20 0337  NA 137 135 135 140 139  K 3.0* 3.4* 3.4* 3.6 3.6  CL 103 101 101 105 107  CO2 27 25 25 27 29   GLUCOSE 91 97 93 90 79  BUN <5* <5* <5* <5* <5*  CREATININE 0.59 0.57 0.52 0.58 0.57  CALCIUM 8.4* 8.5* 8.3* 8.7* 8.7*  MG  --  2.0 2.0  --  1.9    GFR: Estimated Creatinine Clearance: 169.5 mL/min (by C-G formula based on SCr of 0.57 mg/dL). Liver Function Tests: Recent Labs  Lab 11/17/20 0921 11/18/20 0737  11/19/20 0837 11/20/20 0826 11/21/20 0337  AST 49* 58* 48* 49* 57*  ALT 23 23 20 20 22   ALKPHOS 60 63 56 53 51  BILITOT 1.1 1.3* 1.4* 1.1 1.0  PROT 7.2 7.4 6.8 6.8 6.4*  ALBUMIN 3.0* 3.1* 2.9* 2.9* 2.7*    No results for input(s): LIPASE, AMYLASE in the last 168 hours.  No results for input(s): AMMONIA in the last 168 hours. Coagulation Profile: No results for input(s): INR, PROTIME in the last 168 hours. Cardiac Enzymes: No results for input(s): CKTOTAL, CKMB, CKMBINDEX, TROPONINI in the last 168 hours. BNP (last 3 results) No results for input(s): PROBNP in the last 8760 hours. HbA1C: No results for input(s): HGBA1C in the last 72 hours. CBG: No results for input(s): GLUCAP in the last 168 hours. Lipid Profile: No results for input(s): CHOL, HDL, LDLCALC, TRIG, CHOLHDL, LDLDIRECT in the last 72 hours. Thyroid Function Tests: No results for input(s): TSH,  T4TOTAL, FREET4, T3FREE, THYROIDAB in the last 72 hours. Anemia Panel: No results for input(s): VITAMINB12, FOLATE, FERRITIN, TIBC, IRON, RETICCTPCT in the last 72 hours. Sepsis Labs: No results for input(s): PROCALCITON, LATICACIDVEN in the last 168 hours.  No results found for this or any previous visit (from the past 240 hour(s)).     Radiology Studies: DG Knee Left Port  Result Date: 11/21/2020 CLINICAL DATA:  Knee injury, fall a couple weeks ago EXAM: PORTABLE LEFT KNEE - 1-2 VIEW COMPARISON:  None. FINDINGS: No evidence of fracture, dislocation, or joint effusion. No evidence of arthropathy or other focal bone abnormality. Soft tissues are unremarkable. IMPRESSION: No fracture or dislocation of the left knee. Joint spaces are preserved. Electronically Signed   By: Lauralyn Primes M.D.   On: 11/21/2020 19:15    Scheduled Meds:  acetaminophen  1,000 mg Oral TID   cholecalciferol  2,000 Units Oral Daily   enoxaparin (LOVENOX) injection  0.5 mg/kg Subcutaneous Q24H   mouth rinse  15 mL Mouth Rinse BID   metoCLOPramide  (REGLAN) injection  10 mg Intravenous Q8H   metoprolol tartrate  12.5 mg Oral BID   milk and molasses  1 enema Rectal Once   pantoprazole (PROTONIX) IV  40 mg Intravenous Daily   polyethylene glycol  17 g Oral BID   senna-docusate  2 tablet Oral BID   sucralfate  1 g Oral TID WC & HS   Continuous Infusions:  sodium chloride 75 mL/hr at 11/21/20 1222     LOS: 21 days   Rickey Barbara, MD [facility] Pager On Amion  If 7PM-7AM, please contact night-coverage 11/22/2020, 4:04 PM

## 2020-11-22 NOTE — Progress Notes (Signed)
Patient unable to swallow medications without gagging or vomiting them back up. The only medication that patient tolerates is the PO oxycodone sometimes whole or crushed depending on preference. Patient able to eat jello, maintain PO fluids, and snacks on a variety of candies in the room.

## 2020-11-22 NOTE — Progress Notes (Addendum)
Physical Therapy Treatment Patient Details Name: Carla Little MRN: [id number] DOB: [birth year] Today's Date: 11/22/2020    History of Present Illness Carla Little is an 30 y.o.  who is morbidly obese, history of depression and got up to go the bathroom and felt a pop in her knee and had sudden instability and was unable to get up, EMS transported to the ER and found to have a knee dislocation which was reduced with fractures of the proximal fibula ligamentous avulsion laterally as well as medially off the medial femoral condyle and MRI showed complete ACL tear, PCL tear and MCL strain and has a large effusion and is going to be in knee immobilizer.  Vascular ultrasounds done and nondiagnostic. As of 8/13 she was placed in a L Bledsoe brace with WBAT orders and no flexion of knee.    PT Comments    Patient has been seated in recliner several hours and had much difficulty standing up , required 2 persons. Thereafter only 1 min assist from [facility] and BSC.  Patient transferred to [facility] with small steps.  Placed new KI on patient as old one digging into back of heel due to sliding down . New KI  was repositioned multiple times due to sliding. The  KI does not stay in place.when mobilizing.  Sat in [facility] with Leg rest which really does not allow patient to be able to self propel Wc.  Patient unable to ambulate today.  Patient remains deconditioned , reports still having GI issues. Patient currently not at a safe level to be home alone.  Follow Up Recommendations  SNF;Home health PT     Equipment Recommendations  Rolling walker with 5" wheels;3in1 (PT);Wheelchair (measurements PT);Wheelchair cushion (measurements PT)    Recommendations for Other Services       Precautions / Restrictions Precautions Precaution Comments: NO FLEXION LEFT KNEE, using KI right now, Required Braces or Orthoses: Knee Immobilizer - Left   Other Brace: ok to loosen Left brace when resting. Per Dr. Lanier Clam, OK to  use  KI vs [facility] . Have modified the KI and added 2 metal stays across the anterior knee(from another  KI.) Restrictions LLE Weight Bearing: Weight bearing as tolerated    Mobility  Bed Mobility           Sit to supine: Modified independent (Device/Increase time)        Transfers Overall transfer level: Needs assistance Equipment used: Rolling walker (2 wheeled) Transfers: Sit to/from [employer] Sit to Stand: Mod assist         General transfer comment: from recliner attempted x 3 with 1 assist, required 2 persons to assist to rise, Patient has left leg outside RW. Stood from [facility] with min guard. Min assistr to trnasfer to [facility] then bed taking a few steps.  Ambulation/Gait             General Gait Details: NT   Stairs             Wheelchair Mobility    Modified Rankin (Stroke Patients Only)       Balance Overall balance assessment: Needs assistance Sitting-balance support: No upper extremity supported;Feet supported Sitting balance-Leahy Scale: Good     Standing balance support: During functional activity Standing balance-Leahy Scale: Poor Standing balance comment: stands with UE support                            Cognition Arousal/Alertness:  Awake/alert Behavior During Therapy: Anxious                                   General Comments: patient anxious due to not being able to stand up from reclioner, has been seated several hour without moving.      Exercises      General Comments        Pertinent Vitals/Pain Pain Score: 6  Pain Location: L knee Pain Descriptors / Indicators: Discomfort;Grimacing;Guarding Pain Intervention(s): Monitored during session;Limited activity within patient's tolerance    Home Living                      Prior Function            PT Goals (current goals can now be found in the care plan section)      Frequency    Min 5X/week      PT Plan  Current plan remains appropriate;Other (comment);Frequency needs to be updated    Co-evaluation              AM-PAC PT "6 Clicks" Mobility   Outcome Measure  Help needed turning from your back to your side while in a flat bed without using bedrails?: None Help needed moving from lying on your back to sitting on the side of a flat bed without using bedrails?: None Help needed moving to and from a bed to a chair (including a wheelchair)?: A Lot Help needed standing up from a chair using your arms (e.g., wheelchair or bedside chair)?: A Lot Help needed to walk in hospital room?: A Lot Help needed climbing 3-5 steps with a railing? : Total 6 Click Score: 15    End of Session Equipment Utilized During Treatment: Gait belt Activity Tolerance: Patient limited by pain;Patient limited by fatigue Patient left: in bed;with call bell/phone within reach Nurse Communication: Mobility status PT Visit Diagnosis: Unsteadiness on feet (R26.81);History of falling (Z91.81);Pain Pain - Right/Left: Left Pain - part of body: Knee     Time: 6222-9798 PT Time Calculation (min) (ACUTE ONLY): 50 min  Charges:  $Therapeutic Activity: 23-37 mins $Self Care/Home Management: 8-22                     Blanchard Kelch PT Acute Rehabilitation Services Pager [phone] Office [phone]    Rada Hay 11/22/2020, 5:30 PM

## 2020-11-22 NOTE — Progress Notes (Signed)
Patient ID: Carla Little, female   DOB: 09/18/1990, 30 y.o.   MRN: [id number] I did come to the patient's bedside this morning and spoke with her again about her left knee.  It has now been just over 3 weeks since she dislocated that knee.  She is still in a knee immobilizer which is difficult to fit appropriate given her body habitus.  She was worried yesterday after feeling a pop in her knee that something had gotten worse.  New x-rays yesterday of the left knee show that it is still aligned and in good position.  I gave her reassurance that she may still feel a pop from time to time but the knee is in appropriate position.  She can continue attempts to weight-bear as tolerated on that left knee.  There are still no other recommendations from an orthopedic standpoint.  Her knee should continue to stiffen with time.  She should continue bracing that knee until further notice.  Hopefully she will be able to go home soon and then we can see her in routine follow-up in the office as an outpatient.  Her calf is soft and she is able to move her foot and ankle.  I did not stress the knee itself but again gave her reassurance that he is still in place and should continue to stay that way barring any new traumatic event with that knee.

## 2020-11-22 NOTE — Progress Notes (Signed)
Occupational Therapy Treatment Patient Details Name: Carla Little MRN: [id number] DOB: 28-Aug-1990 Today's Date: 11/22/2020    History of present illness Carla Little is an 30 y.o.  who is morbidly obese, history of depression and got up to go the bathroom and felt a pop in her knee and had sudden instability and was unable to get up, EMS transported to the ER and found to have a knee dislocation which was reduced with fractures of the proximal fibula ligamentous avulsion laterally as well as medially off the medial femoral condyle and MRI showed complete ACL tear, PCL tear and MCL strain and has a large effusion and is going to be in knee immobilizer.  Vascular ultrasounds done and nondiagnostic. As of 8/13 she was placed in a L Bledsoe brace with WBAT orders and no flexion of knee.   OT comments  Patient was noted to make progress with hygiene tasks with just set up on this date while seated. Patient was min guard for transfers from 3 in 1 commode to recliner in room with RW with one LOB noted with min A to regain standing balance. Patient was educated on slowing down during tasks. Patient was educated on diaphragmatic breathing strategies to reduce shortness of breath with activity. Patient reported she would trial this. Patient was able to stand for 1 min for standing trial with RW support on this date. Patient continues to make progress towards goals. Patient's discharge plan remains appropriate at this time. OT will continue to follow acutely.    Follow Up Recommendations  SNF    Equipment Recommendations  Other (comment) (bariatric commode and walker tray)    Recommendations for Other Services      Precautions / Restrictions Precautions Precautions: Fall Precaution Comments: NO FLEXION LEFT KNEE, using KI right now, Required Braces or Orthoses: Knee Immobilizer - Left Knee Immobilizer - Left: On at all times Other Brace: ok to loosen Left brace when resting. Per Dr. Lanier Clam,  OK to use  KI vs [facility] . Have modified the KI and added 2 metal stays across the anterior knee(from another  KI.) Restrictions Weight Bearing Restrictions: No LLE Weight Bearing: Weight bearing as tolerated       Mobility Bed Mobility               General bed mobility comments: in recliner at this time    Transfers Overall transfer level: Needs assistance Equipment used: Rolling walker (2 wheeled) Transfers: Sit to/from Stand Sit to Stand: Min guard         General transfer comment: stood from recliner x 3 to increase static standing times    Balance                                           ADL either performed or assessed with clinical judgement   ADL Overall ADL's : Needs assistance/impaired                         Toilet Transfer: BSC;Minimal Production designer, theatre/television/film Details (indicate cue type and reason): patient completed transfer to and from [facility] with min guard with RW with adjustment of KI. patient was noted to have one LOB with min A to maintain standing balance. patient continues to express fear of falling. patient was educated on importance of taking turns slowly to reduce risk of  falling. Toileting- Clothing Manipulation and Hygiene: Set up;Sitting/lateral lean Toileting - Clothing Manipulation Details (indicate cue type and reason): patient was able to complete hygiene tasks with set up sitting on commode on this date.       General ADL Comments: patient participated in standing tolerance attempts with patient able to stand for 2 attempts for about 30 seconds and last attempt was for 1 min. patient reported increased pain with attempted standing trials.     Vision       Perception     Praxis      Cognition Arousal/Alertness: Awake/alert Behavior During Therapy: WFL for tasks assessed/performed Overall Cognitive Status: Within Functional Limits for tasks assessed                                           Exercises     Shoulder Instructions       General Comments      Pertinent Vitals/ Pain       Pain Assessment: 0-10 Pain Score: 8  Pain Location: L knee Pain Descriptors / Indicators: Discomfort;Grimacing;Guarding Pain Intervention(s): Monitored during session;Limited activity within patient's tolerance  Home Living                                          Prior Functioning/Environment              Frequency  Min 2X/week        Progress Toward Goals  OT Goals(current goals can now be found in the care plan section)     Acute Rehab OT Goals Patient Stated Goal: to get home soon  Plan Discharge plan needs to be updated    Co-evaluation                 AM-PAC OT "6 Clicks" Daily Activity     Outcome Measure   Help from another person eating meals?: None Help from another person taking care of personal grooming?: A Little Help from another person toileting, which includes using toliet, bedpan, or urinal?: A Little Help from another person bathing (including washing, rinsing, drying)?: A Lot Help from another person to put on and taking off regular upper body clothing?: A Little Help from another person to put on and taking off regular lower body clothing?: A Little 6 Click Score: 18    End of Session Equipment Utilized During Treatment: Rolling walker  OT Visit Diagnosis: Other abnormalities of gait and mobility (R26.89);History of falling (Z91.81);Pain Pain - Right/Left: Left Pain - part of body: Knee   Activity Tolerance Patient tolerated treatment well   Patient Left in chair;with call bell/phone within reach   Nurse Communication Other (comment) (nurse cleared patient to participate)        Time: 7628-3151 OT Time Calculation (min): 27 min  Charges: OT General Charges $OT Visit: 1 Visit OT Treatments $Self Care/Home Management : 23-37 mins  Carla Little OTR/L, MS Acute Rehabilitation Department Office#  [phone] Pager# [phone]    [facility] Carla Little 11/22/2020, 11:03 AM

## 2020-11-22 NOTE — Progress Notes (Signed)
Orthopedic Tech Progress Note Patient Details:  Carla Little 12/13/1990 [id number]  Ortho Devices Type of Ortho Device: Knee Immobilizer Ortho Device/Splint Location: replacment knee immobilizer Ortho Device/Splint Interventions: Application   Post Interventions Instructions Provided: Care of device  Saul Fordyce 11/22/2020, 4:38 PM

## 2020-11-23 LAB — SURGICAL PATHOLOGY

## 2020-11-23 NOTE — Progress Notes (Signed)
PROGRESS NOTE    Carla Little  XLK:[id number] DOB: [birth year] DOA: 10/31/2020 PCP: Patient, No Pcp Per (Inactive)  Brief Narrative: This 30 year old female with history of morbid obesity, anxiety/depression, who felt a pop in her left knee when ambulating and suddenly experience instability with resultant mechanical fall.  EMS was called and she was found to have dislocated left knee.  This was recently reduced in the ED, MRI revealed a complete ACL tear, PCL tear and MCL strain as well as large effusion.  Orthopedic surgery was consulted and is managing with brace.  No plan for surgery.  Therapy recommended skilled nursing facility.  Patient continued to have intractable nausea and vomiting.  Assessment & Plan:   Active Problems:   Left knee dislocation   Knee dislocation, left, initial encounter   Pressure injury of skin   Left knee dislocation /ACL and PCL tear : Patient presented status post fall. MRI reveals ACL and PCL tear/avulsion fracture involving the head of fibula/patellar dislocation/MCL sprain Orthopedics were consulted, No plan for operative management. Bledsoe hinged knee brace with serial exams to ensure that knee remains in place. She Noted a "pop"  8/29 while working with PT. Seen by orthopedic surgery, f/u xray unremarkable.  Cont with PT as tolerated Continue adequate pain control with pain medications.   Intractable nausea and vomiting. Initially noted to have improved after starting  pepcid 20 mg iv q 12 hr and reglan 10 mg iv q 8 hr KUB was noted to be unremarkable Recently noted to have intolerance to solids but tolerates liquids. Esophagram was attempted however pt vomited the contrast, thus was an incomplete study Appreciate assistance by gastroenterology.  Patient is now status post EGD. With findings of gastritis as well as a small superficial ulcer. Abdominal ultrasound reviewed, unremarkable GI recommendation to limit narcotics if  possible. Patient continues to have persistent nausea and vomiting.   UTI : She complained of dysuria, UA is abnormal. Urine culture grew Proteus mirabilis sensitive to ceftriaxone. She completed 7 days of ceftriaxone.  Persistent tachycardia D-dimer was elevated, CTA chest ruled out PE Continue metoprolol 12.5 mg every 12 hours daily.  Acute urinary retention >  resolved.   Continue Urecholine   Acute hypoxic respiratory failure: > Resolved Patient would greatly benefit from sleep study outpatient. Concern for sleep apnea.   Iron-deficiency anemia Ferritin low at 11, iron saturation 6%. Pt given IV iron on 8/12 hold p.o. iron supplementation for nausea/vomiting.    Thrombocytosis Likely reactive   Euthyroid sick syndrome TSH 11.895, T4 0.99 Recommend repeat TSH in 4 to 6 weeks as outpatient   Constipation/fecal impaction Noted to have resolved. Pt reports bowel movements Continue Senokot S tablet twice a day, MiraLAX 17 g p.o. twice daily   Class III Morbid obesity Calculated BMI of 69.11 Consider outpatient referral for gastric bypass   DVT prophylaxis: Lovenox Code Status: Full code Family Communication: No family at bedside Disposition Plan:  Status is: Inpatient  Remains inpatient appropriate because:Inpatient level of care appropriate due to severity of illness  Dispo: The patient is from: Home              Anticipated d/c is to: Home              Patient currently is not medically stable to d/c.   Difficult to place patient No   Consultants:  Orthopedics  Procedures:  Antimicrobials:   Anti-infectives (From admission, onward)    Start     Dose/Rate  Route Frequency Ordered Stop   11/13/20 1400  ceFAZolin (ANCEF) IVPB 1 g/50 mL premix        1 g 100 mL/hr over 30 Minutes Intravenous Every 8 hours 11/13/20 1153 11/17/20 2240   11/11/20 1130  cefTRIAXone (ROCEPHIN) 1 g in sodium chloride 0.9 % 100 mL IVPB  Status:  Discontinued        1 g 200  mL/hr over 30 Minutes Intravenous Every 24 hours 11/11/20 1033 11/13/20 1153       Subjective: Patient was seen and examined at bedside.  Overnight events noted.  She still reports in a lot of pain, still has persistent nausea and vomiting,  not able to stand and not able to tolerate any solid foods.  Objective: Vitals:   11/22/20 2100 11/23/20 0424 11/23/20 0540 11/23/20 1351  BP:  (!) 98/55 102/62 (!) 118/59  Pulse:  (!) 109 (!) 101 92  Resp:  17 16 18   Temp: 98.1 F (36.7 C) 98.7 F (37.1 C) 98.4 F (36.9 C) 99.1 F (37.3 C)  TempSrc: Oral Oral Oral Oral  SpO2:  97% 95%   Weight:      Height:        Intake/Output Summary (Last 24 hours) at 11/23/2020 1631 Last data filed at 11/22/2020 2200 Gross per 24 hour  Intake 360 ml  Output --  Net 360 ml   Filed Weights   11/01/20 0026  Weight: (!) 181.4 kg    Examination:  General exam: Appears comfortable, not in any acute distress. Respiratory system: Clear to auscultation. Respiratory effort normal. Cardiovascular system: S1 & S2 heard, RRR. No JVD, murmurs, rubs, gallops or clicks. No pedal edema. Gastrointestinal system: Abdomen is nondistended, soft and nontender. No organomegaly or masses felt. Normal bowel sounds heard. Central nervous system: Alert and oriented. No focal neurological deficits. Extremities: Left knee in brace.  Reports tenderness. Skin: No rashes, lesions or ulcers Psychiatry: Judgement and insight appear normal. Mood & affect appropriate.     Data Reviewed: I have personally reviewed following labs and imaging studies  CBC: Recent Labs  Lab 11/20/20 0435  WBC 5.5  HGB 8.3*  HCT 27.8*  MCV 89.7  PLT 428*   Basic Metabolic Panel: Recent Labs  Lab 11/17/20 0921 11/18/20 0737 11/19/20 0837 11/20/20 0826 11/21/20 0337  NA 137 135 135 140 139  K 3.0* 3.4* 3.4* 3.6 3.6  CL 103 101 101 105 107  CO2 27 25 25 27 29   GLUCOSE 91 97 93 90 79  BUN <5* <5* <5* <5* <5*  CREATININE 0.59 0.57  0.52 0.58 0.57  CALCIUM 8.4* 8.5* 8.3* 8.7* 8.7*  MG  --  2.0 2.0  --  1.9   GFR: Estimated Creatinine Clearance: 169.5 mL/min (by C-G formula based on SCr of 0.57 mg/dL). Liver Function Tests: Recent Labs  Lab 11/17/20 0921 11/18/20 0737 11/19/20 0837 11/20/20 0826 11/21/20 0337  AST 49* 58* 48* 49* 57*  ALT 23 23 20 20 22   ALKPHOS 60 63 56 53 51  BILITOT 1.1 1.3* 1.4* 1.1 1.0  PROT 7.2 7.4 6.8 6.8 6.4*  ALBUMIN 3.0* 3.1* 2.9* 2.9* 2.7*   No results for input(s): LIPASE, AMYLASE in the last 168 hours. No results for input(s): AMMONIA in the last 168 hours. Coagulation Profile: No results for input(s): INR, PROTIME in the last 168 hours. Cardiac Enzymes: No results for input(s): CKTOTAL, CKMB, CKMBINDEX, TROPONINI in the last 168 hours. BNP (last 3 results) No results for input(s):  PROBNP in the last 8760 hours. HbA1C: No results for input(s): HGBA1C in the last 72 hours. CBG: No results for input(s): GLUCAP in the last 168 hours. Lipid Profile: No results for input(s): CHOL, HDL, LDLCALC, TRIG, CHOLHDL, LDLDIRECT in the last 72 hours. Thyroid Function Tests: No results for input(s): TSH, T4TOTAL, FREET4, T3FREE, THYROIDAB in the last 72 hours. Anemia Panel: No results for input(s): VITAMINB12, FOLATE, FERRITIN, TIBC, IRON, RETICCTPCT in the last 72 hours. Sepsis Labs: No results for input(s): PROCALCITON, LATICACIDVEN in the last 168 hours.  No results found for this or any previous visit (from the past 240 hour(s)).   Radiology Studies: DG Knee Left Port  Result Date: 11/21/2020 CLINICAL DATA:  Knee injury, fall a couple weeks ago EXAM: PORTABLE LEFT KNEE - 1-2 VIEW COMPARISON:  None. FINDINGS: No evidence of fracture, dislocation, or joint effusion. No evidence of arthropathy or other focal bone abnormality. Soft tissues are unremarkable. IMPRESSION: No fracture or dislocation of the left knee. Joint spaces are preserved. Electronically Signed   By: Lauralyn Primes M.D.    On: 11/21/2020 19:15     Scheduled Meds:  acetaminophen  1,000 mg Oral TID   cholecalciferol  2,000 Units Oral Daily   enoxaparin (LOVENOX) injection  0.5 mg/kg Subcutaneous Q24H   mouth rinse  15 mL Mouth Rinse BID   metoCLOPramide (REGLAN) injection  10 mg Intravenous Q8H   metoprolol tartrate  12.5 mg Oral BID   milk and molasses  1 enema Rectal Once   pantoprazole (PROTONIX) IV  40 mg Intravenous Daily   polyethylene glycol  17 g Oral BID   senna-docusate  2 tablet Oral BID   sucralfate  1 g Oral TID WC & HS   Continuous Infusions:  sodium chloride 75 mL/hr at 11/23/20 0426     LOS: 22 days    Time spent: 35 mins    Ayliana Casciano, MD [facility]   If 7PM-7AM, please contact night-coverage

## 2020-11-23 NOTE — Progress Notes (Addendum)
Inpatient Rehabilitation Admissions Coordinator   Asked by Oneita Jolly, of assessment for a possible [facility] admit. I reviewed chart and spoke with patient by phone. Per patient having difficulty with nausea, po diet and ability to take pain meds . She was progressing well last week with therapy to possible discharge home with [facility]. Patient feels she has taking a backslide this week. She is checking with a cousin in [place] who may be able to come to assist her for a few weeks at discharge. I will discuss case with Dr Riley Kill and follow up with acute team and TOC of our recommendations. Estimate of cost of care discussed with patient as she is uninsured.  Ottie Glazier, RN, MSN Rehab Admissions Coordinator [phone] 11/23/2020 1:35 PM

## 2020-11-23 NOTE — TOC Progression Note (Addendum)
Transition of Care [facility]) - Progression Note    Patient Details  Name: Carla Little MRN: [id number] Date of Birth: 08-26-1990  Transition [facility] CM/SW Contact  Darleene Cleaver, [place] Phone Number: 11/23/2020, 4:28 PM  Clinical Narrative:     CSW informed that patient is considering CIR.  Patient on the difficult to place list  Patient was progressing last week and it was looking like she may be able to go home with home health, however patient has been doing worse this week.  CSW continuing to follow patient's progress.   Expected Discharge Plan: Skilled Nursing Facility Barriers to Discharge: Continued Medical Work up, Inadequate or no insurance, SNF Pending bed offer  Expected Discharge Plan and Services Expected Discharge Plan: Skilled Nursing Facility In-house Referral: Clinical Social Work, Chief Executive Officer Acute Care Choice: Skilled Nursing Facility Living arrangements for the past 2 months: Apartment                 DME Arranged: N/A DME Agency: NA                   Social Determinants of Health (SDOH) Interventions    Readmission Risk Interventions No flowsheet data found.

## 2020-11-23 NOTE — Progress Notes (Addendum)
Physical Therapy Treatment Patient Details Name: Carla Little MRN: [id number] DOB: March 15, 1991 Today's Date: 11/23/2020    History of Present Illness Carla Little is an 30 y.o.  who is morbidly obese, history of depression and got up to go the bathroom and felt a pop in her knee and had sudden instability and was unable to get up, EMS transported to the ER and found to have a knee dislocation which was reduced with fractures of the proximal fibula ligamentous avulsion laterally as well as medially off the medial femoral condyle and MRI showed complete ACL tear, PCL tear and MCL strain and has a large effusion and is going to be in knee immobilizer.  Vascular ultrasounds done and nondiagnostic. As of 8/13 she was placed in a L Bledsoe brace with WBAT orders and no flexion of knee.    PT Comments    PT assessed the 20" KI, noted to slip down after  readjustments and standing.  Place a 64" KI with extra stay across front. Patient had much more difficulty with sit  to stand. Performed x 3 and ambulated only ~ 5 feet before reporting stays  digging into heel..  Placed 20" KI  on and repositioned as high as possible . Patient able to stand with min guard. PT's assessed that the 20 " slides down and patient's knee is flexed to ~ 25* which is allowing  easier standing, where as the 64' KI keeps the left knee in more extension and  patient had more difficulty. Will continue assessing appropriate best fitting KI to provide most support and allowing patient to reposition independently. Also continue assessing use of a WC.    Follow Up Recommendations  SNF;Home health PT, currently needs assistance     Equipment Recommendations  Rolling walker with 5" wheels;3in1 (PT);Wheelchair (measurements PT);Wheelchair cushion (measurements PT)    Recommendations for Other Services       Precautions / Restrictions Precautions Precautions: Fall Precaution Comments: NO FLEXION LEFT KNEE, using KI right  now, Required Braces or Orthoses: Knee Immobilizer - Left  Other Brace: ok to loosen Left brace when resting. Per Dr. Lanier Clam, OK to use  KI vs [facility] . Have modified the KI and added 2 metal stays across the anterior knee(from another  KI.)    Mobility  Bed Mobility   Bed Mobility: Sit to Supine       Sit to supine: Modified independent (Device/Increase time)   General bed mobility comments: lifts legs onto bed    Transfers   Equipment used: Rolling walker (2 wheeled) Transfers: Sit to/from Stand Sit to Stand: Mod assist;+2 safety/equipment         General transfer comment: Left KI adjusted multiple times, with 2 different lengths of KI  Ambulation/Gait Ambulation/Gait assistance: Min guard Gait Distance (Feet): 10 Feet Assistive device: Rolling walker (2 wheeled) Gait Pattern/deviations: Step-to pattern;Wide base of support Gait velocity: decr   General Gait Details: on last attempt with 20 " KI patient able to ambulate, with 24" ,only went 5 ' x 3 stTING LONGER BRACE LIMITING   Stairs             Wheelchair Mobility    Modified Rankin (Stroke Patients Only)       Balance   Sitting-balance support: No upper extremity supported;Feet supported Sitting balance-Leahy Scale: Good Sitting balance - Comments: reliant on UE support     Standing balance-Leahy Scale: Fair  Cognition Arousal/Alertness: Awake/alert Behavior During Therapy: Anxious Overall Cognitive Status: Within Functional Limits for tasks assessed                                        Exercises      General Comments        Pertinent Vitals/Pain Pain Score: 8  Pain Location: L knee Pain Descriptors / Indicators: Discomfort;Grimacing;Guarding Pain Intervention(s): Patient requesting pain meds-RN notified    Home Living                      Prior Function            PT Goals (current goals can now be found  in the care plan section)      Frequency    Min 5X/week      PT Plan Current plan remains appropriate;Other (comment);Frequency needs to be updated    Co-evaluation              AM-PAC PT "6 Clicks" Mobility   Outcome Measure  Help needed turning from your back to your side while in a flat bed without using bedrails?: None Help needed moving from lying on your back to sitting on the side of a flat bed without using bedrails?: None Help needed moving to and from a bed to a chair (including a wheelchair)?: A Little Help needed standing up from a chair using your arms (e.g., wheelchair or bedside chair)?: A Lot Help needed to walk in hospital room?: A Lot Help needed climbing 3-5 steps with a railing? : Total 6 Click Score: 16    End of Session   Activity Tolerance: Patient tolerated treatment well Patient left: in bed;with call bell/phone within reach Nurse Communication: Mobility status PT Visit Diagnosis: Unsteadiness on feet (R26.81);History of falling (Z91.81);Pain Pain - Right/Left: Left Pain - part of body: Knee     Time: 1400-1520 PT Time Calculation (min) (ACUTE ONLY): 80 min  Charges:  $Gait Training: 38-52 mins $Self Care/Home Management: 23-37                     Blanchard Kelch PT Acute Rehabilitation Services Pager [phone] Office [phone]    Rada Hay 11/23/2020, 5:22 PM

## 2020-11-23 NOTE — Progress Notes (Signed)
   11/23/20 0424  Assess: MEWS Score  Temp 98.7 F (37.1 C)  BP (!) 98/55  Pulse Rate (!) 109  Resp 17  SpO2 97 %  O2 Device Room Air  Assess: MEWS Score  MEWS Temp 0  MEWS Systolic 1  MEWS Pulse 1  MEWS RR 0  MEWS LOC 0  MEWS Score 2  MEWS Score Color Yellow  Assess: if the MEWS score is Yellow or Red  Were vital signs taken at a resting state? Yes  Focused Assessment No change from prior assessment  Does the patient meet 2 or more of the SIRS criteria? No  Does the patient have a confirmed or suspected source of infection? No  Provider and Rapid Response Notified? No  MEWS guidelines implemented *See Row Information* Yes  Treat  MEWS Interventions Other (Comment) (VS Q2hour)  Pain Scale 0-10  Pain Score 0  Take Vital Signs  Increase Vital Sign Frequency  Yellow: Q 2hr X 2 then Q 4hr X 2, if remains yellow, continue Q 4hrs  Escalate  MEWS: Escalate Yellow: discuss with charge nurse/RN and consider discussing with provider and RRT  Notify: Charge Nurse/RN  Name of Charge Nurse/RN Notified Financial planner  Date Charge Nurse/RN Notified 11/23/20  Time Charge Nurse/RN Notified 0430  Document  Patient Outcome Other (Comment) (Stable;continue monitoring VS)  Progress note created (see row info) Yes  Assess: SIRS CRITERIA  SIRS Temperature  0  SIRS Pulse 1  SIRS Respirations  0  SIRS WBC 0  SIRS Score Sum  1

## 2020-11-23 NOTE — Progress Notes (Signed)
Orthopedic Tech Progress Note Patient Details:  Carla Little Feb 12, 1991 [id number]  Ortho Devices Type of Ortho Device: Knee Immobilizer Ortho Device/Splint Location: Replacement 22'' knee immobilizer Ortho Device/Splint Interventions: Application   Post Interventions Instructions Provided: Care of device  Saul Fordyce 11/23/2020, 3:31 PM

## 2020-11-24 NOTE — Progress Notes (Signed)
Physical Therapy Treatment Patient Details Name: Carla Little MRN: [id number] DOB: [birth year] Today's Date: 11/24/2020    History of Present Illness Carla Little is an 30 y.o.  who is morbidly obese, history of depression and got up to go the bathroom and felt a pop in her knee and had sudden instability and was unable to get up, EMS transported to the ER and found to have a knee dislocation which was reduced with fractures of the proximal fibula ligamentous avulsion laterally as well as medially off the medial femoral condyle and MRI showed complete ACL tear, PCL tear and MCL strain and has a large effusion and is going to be in knee immobilizer.  Vascular ultrasounds done and nondiagnostic. As of 8/13 she was placed in a L Bledsoe brace with WBAT orders and no flexion of knee.    PT Comments    Patient reports feeling better today. Patient's Left KI assessed, now back to 20" with 2 extra metal stays in front.  Patient demonstrated being able to adjust KI when she places the left leg onto a chair. Patient ambulated x 20'then 56' with RW Patient propelled WC x 50' x 4, with rest breaks. WC measured 30", removed armrests, no  foot rest used. Patient demonstrates improved endurance for ambulation.  KI continues to slide down with left knee bending at ~ 20-25*. Patient repositions as best that she can. Recommend WC as patient's endurance remains low. She will need to ambulate in her apartment short distances where [facility] will not fit through doorways. Continue PT for endurance  for activity and KI assessments   Follow Up Recommendations  Home health PT/CIR not cost effective per patient.     Equipment Recommendations  Rolling walker with 5" wheels;bariatric 3in1 (PT);bariatric Wheelchair (measurements PT) 30" was used today in PT;Does not need a cushion Hospital bed unless she is safe on her 60" high bed   Recommendations for Other Services       Precautions / Restrictions  Precautions Precautions: Fall Precaution Comments: NO FLEXION LEFT KNEE, using KI right now, Required Braces or Orthoses: Knee Immobilizer - Left Knee Immobilizer - Left: On at all times Other Brace: ok to loosen Left brace when resting. Per Dr. Lanier Clam, OK to use  KI vs [facility] . Have modified the KI and added 2 metal stays across the anterior knee(from another  KI.)    Mobility  Bed Mobility Overal bed mobility: Modified Independent                  Transfers   Equipment used: Rolling walker (2 wheeled) Transfers: Sit to/from Stand Sit to Stand: Supervision         General transfer comment: from bed and from WC-performed x 4  Ambulation/Gait Ambulation/Gait assistance: Min guard Gait Distance (Feet): 25 Feet (then 40') Assistive device: Rolling walker (2 wheeled) Gait Pattern/deviations: Step-through pattern Gait velocity: decr   General Gait Details: KI slides down, patient  able to slide up an inch between walks   Stairs             Wheelchair Mobility    Modified Rankin (Stroke Patients Only)       Balance   Sitting-balance support: No upper extremity supported Sitting balance-Leahy Scale: Good     Standing balance support: During functional activity Standing balance-Leahy Scale: Fair Standing balance comment: stands with UE support  Cognition Arousal/Alertness: Awake/alert                                            Exercises      General Comments        Pertinent Vitals/Pain Faces Pain Scale: Hurts little more Pain Location: L knee Pain Descriptors / Indicators: Discomfort;Grimacing;Guarding Pain Intervention(s): Monitored during session;Premedicated before session    Home Living                      Prior Function            PT Goals (current goals can now be found in the care plan section) Progress towards PT goals: Progressing toward goals     Frequency    Min 5X/week      PT Plan Current plan remains appropriate;Other (comment);Frequency needs to be updated    Co-evaluation              AM-PAC PT "6 Clicks" Mobility   Outcome Measure  Help needed turning from your back to your side while in a flat bed without using bedrails?: None Help needed moving from lying on your back to sitting on the side of a flat bed without using bedrails?: None Help needed moving to and from a bed to a chair (including a wheelchair)?: A Little Help needed standing up from a chair using your arms (e.g., wheelchair or bedside chair)?: A Little Help needed to walk in hospital room?: A Little Help needed climbing 3-5 steps with a railing? : Total 6 Click Score: 18    End of Session Equipment Utilized During Treatment: Gait belt Activity Tolerance: Patient tolerated treatment well Patient left: in bed;with call bell/phone within reach;with bed alarm set Nurse Communication: Mobility status PT Visit Diagnosis: Unsteadiness on feet (R26.81);History of falling (Z91.81);Pain Pain - Right/Left: Left Pain - part of body: Knee     Time: 5176-1607 PT Time Calculation (min) (ACUTE ONLY): 81 min  Charges:  $Gait Training: 23-37 mins $Self Care/Home Management: 8-22 $Wheel Chair Management: 23-37 mins                     ,Blanchard Kelch PT Acute Rehabilitation Services Pager [phone] Office [phone]    Rada Hay 11/24/2020, 5:15 PM

## 2020-11-24 NOTE — Progress Notes (Addendum)
PROGRESS NOTE    Carla Little  YKZ:[id number] DOB: 03-16-1991 DOA: 10/31/2020 PCP: Patient, No Pcp Per (Inactive)  Brief Narrative: This 30 year old female with history of morbid obesity, anxiety/depression, who felt a pop in her left knee when ambulating and suddenly experience instability with resultant mechanical fall.  EMS was called and she was found to have dislocated left knee.  This was recently reduced in the ED, MRI revealed a complete ACL tear, PCL tear and MCL strain as well as large effusion.  Orthopedic surgery was consulted and is managing with brace.  No plan for surgery.  Therapy recommended skilled nursing facility.  Patient continued to have intractable nausea and vomiting.  Assessment & Plan:   Active Problems:   Left knee dislocation   Knee dislocation, left, initial encounter   Pressure injury of skin   Left knee dislocation /ACL and PCL tear : Patient presented status post fall. MRI reveals ACL and PCL tear/avulsion fracture involving the head of fibula/patellar dislocation/MCL sprain Orthopedics were consulted, No plan for operative management. Bledsoe hinged knee brace with serial exams to ensure that knee remains in place. She Noted a "pop"  8/29 while working with PT. Seen by orthopedic surgery, f/u xray unremarkable.  Cont with PT as tolerated. Continue adequate pain control with pain medications.   Intractable nausea and vomiting. Initially noted to have improved after starting  pepcid 20 mg iv q 12 hr and reglan 10 mg iv q 8 hr KUB was noted to be unremarkable Recently noted to have intolerance to solids but tolerates liquids. Esophagram was attempted however pt vomited the contrast, thus was an incomplete study Appreciate assistance by gastroenterology.  Patient is now status post EGD. With findings of gastritis as well as a small superficial ulcer. Abdominal ultrasound reviewed, unremarkable GI recommendation to limit narcotics if  possible. Patient continues to have persistent nausea and vomiting.   UTI : She complained of dysuria, UA is abnormal. Urine culture grew Proteus mirabilis sensitive to ceftriaxone. She completed 7 days of ceftriaxone.  Persistent tachycardia D-dimer was elevated, CTA chest ruled out PE Continue metoprolol 12.5 mg every 12 hours daily.  Acute urinary retention >  resolved.   Continue Urecholine   Acute hypoxic respiratory failure: > Resolved Patient would greatly benefit from sleep study outpatient. Concern for sleep apnea.   Iron-deficiency anemia Ferritin low at 11, iron saturation 6%. Pt given IV iron on 8/12 hold p.o. iron supplementation for nausea/vomiting.    Thrombocytosis Likely reactive   Euthyroid sick syndrome TSH 11.895, T4 0.99 Recommend repeat TSH in 4 to 6 weeks as outpatient   Constipation/fecal impaction Noted to have resolved. Pt reports bowel movements Continue Senokot S tablet twice a day, MiraLAX 17 g p.o. twice daily   Class III Morbid obesity Calculated BMI of 69.11 Consider outpatient referral for gastric bypass   DVT prophylaxis: Lovenox Code Status: Full code Family Communication: No family at bedside Disposition Plan:  Status is: Inpatient  Remains inpatient appropriate because:Inpatient level of care appropriate due to severity of illness  Dispo: The patient is from: Home              Anticipated d/c is to:  SNF              Patient currently is not medically stable to d/c.   Difficult to place patient No   Consultants:  Orthopedics  Procedures:  Antimicrobials:   Anti-infectives (From admission, onward)    Start  Dose/Rate Route Frequency Ordered Stop   11/13/20 1400  ceFAZolin (ANCEF) IVPB 1 g/50 mL premix        1 g 100 mL/hr over 30 Minutes Intravenous Every 8 hours 11/13/20 1153 11/17/20 2240   11/11/20 1130  cefTRIAXone (ROCEPHIN) 1 g in sodium chloride 0.9 % 100 mL IVPB  Status:  Discontinued        1 g 200  mL/hr over 30 Minutes Intravenous Every 24 hours 11/11/20 1033 11/13/20 1153       Subjective: Patient was seen and examined at bedside.  Overnight events noted.   She is still reporting a lot of pain, still has persistent nausea and vomiting. Patient is not able to stand and not able to tolerate any solid food yet.  Objective: Vitals:   11/23/20 1351 11/23/20 2110 11/24/20 0831 11/24/20 0923  BP: (!) 118/59 108/80 109/75 (!) 102/58  Pulse: 92 94 96   Resp: 18  18   Temp: 99.1 F (37.3 C) 98.2 F (36.8 C) 98 F (36.7 C)   TempSrc: Oral Oral Oral   SpO2: 95% 99% 100%   Weight:      Height:        Intake/Output Summary (Last 24 hours) at 11/24/2020 1535 Last data filed at 11/24/2020 1300 Gross per 24 hour  Intake 6484.17 ml  Output 951 ml  Net 5533.17 ml   Filed Weights   11/01/20 0026  Weight: (!) 181.4 kg    Examination:  General exam: Appears comfortable, not in any acute distress. Respiratory system: Clear to auscultation. Respiratory effort normal. Cardiovascular system: S1 & S2 heard, RRR. No JVD, murmurs, rubs, gallops or clicks. No pedal edema. Gastrointestinal system: Abdomen is nondistended, soft and nontender. No organomegaly or masses felt. Normal bowel sounds heard. Central nervous system: Alert and oriented X 3. No focal neurological deficits. Extremities: Left knee in brace.  Reports tenderness. Skin: No rashes, lesions or ulcers Psychiatry: Judgement and insight appear normal. Mood & affect appropriate.     Data Reviewed: I have personally reviewed following labs and imaging studies  CBC: Recent Labs  Lab 11/20/20 0435  WBC 5.5  HGB 8.3*  HCT 27.8*  MCV 89.7  PLT 428*   Basic Metabolic Panel: Recent Labs  Lab 11/18/20 0737 11/19/20 0837 11/20/20 0826 11/21/20 0337  NA 135 135 140 139  K 3.4* 3.4* 3.6 3.6  CL 101 101 105 107  CO2 25 25 27 29   GLUCOSE 97 93 90 79  BUN <5* <5* <5* <5*  CREATININE 0.57 0.52 0.58 0.57  CALCIUM 8.5* 8.3*  8.7* 8.7*  MG 2.0 2.0  --  1.9   GFR: Estimated Creatinine Clearance: 169.5 mL/min (by C-G formula based on SCr of 0.57 mg/dL). Liver Function Tests: Recent Labs  Lab 11/18/20 0737 11/19/20 0837 11/20/20 0826 11/21/20 0337  AST 58* 48* 49* 57*  ALT 23 20 20 22   ALKPHOS 63 56 53 51  BILITOT 1.3* 1.4* 1.1 1.0  PROT 7.4 6.8 6.8 6.4*  ALBUMIN 3.1* 2.9* 2.9* 2.7*   No results for input(s): LIPASE, AMYLASE in the last 168 hours. No results for input(s): AMMONIA in the last 168 hours. Coagulation Profile: No results for input(s): INR, PROTIME in the last 168 hours. Cardiac Enzymes: No results for input(s): CKTOTAL, CKMB, CKMBINDEX, TROPONINI in the last 168 hours. BNP (last 3 results) No results for input(s): PROBNP in the last 8760 hours. HbA1C: No results for input(s): HGBA1C in the last 72 hours. CBG: No  results for input(s): GLUCAP in the last 168 hours. Lipid Profile: No results for input(s): CHOL, HDL, LDLCALC, TRIG, CHOLHDL, LDLDIRECT in the last 72 hours. Thyroid Function Tests: No results for input(s): TSH, T4TOTAL, FREET4, T3FREE, THYROIDAB in the last 72 hours. Anemia Panel: No results for input(s): VITAMINB12, FOLATE, FERRITIN, TIBC, IRON, RETICCTPCT in the last 72 hours. Sepsis Labs: No results for input(s): PROCALCITON, LATICACIDVEN in the last 168 hours.  No results found for this or any previous visit (from the past 240 hour(s)).   Radiology Studies: No results found.   Scheduled Meds:  acetaminophen  1,000 mg Oral TID   cholecalciferol  2,000 Units Oral Daily   enoxaparin (LOVENOX) injection  0.5 mg/kg Subcutaneous Q24H   mouth rinse  15 mL Mouth Rinse BID   metoCLOPramide (REGLAN) injection  10 mg Intravenous Q8H   metoprolol tartrate  12.5 mg Oral BID   milk and molasses  1 enema Rectal Once   pantoprazole (PROTONIX) IV  40 mg Intravenous Daily   polyethylene glycol  17 g Oral BID   senna-docusate  2 tablet Oral BID   sucralfate  1 g Oral TID WC &  HS   Continuous Infusions:  sodium chloride 75 mL/hr at 11/24/20 0645     LOS: 23 days    Time spent: 25 mins    Noa Constante, MD [facility]   If 7PM-7AM, please contact night-coverage

## 2020-11-24 NOTE — TOC Progression Note (Addendum)
Transition of Care [facility]) - Progression Note    Patient Details  Name: Carla Little MRN: [id number] Date of Birth: 09-26-1990  Transition of Care [facility]) CM/SW Contact  Darleene Cleaver, [place] Phone Number: 11/24/2020, 4:39 PM  Clinical Narrative:    CSW was informed that CIR is reviewing patient's case and will let CSW know if they can accept her or not.  CSW continuing to follow patient's progress throughout discharge planning.   Expected Discharge Plan: Skilled Nursing Facility Barriers to Discharge: Continued Medical Work up, Inadequate or no insurance, SNF Pending bed offer  Expected Discharge Plan and Services Expected Discharge Plan: Skilled Nursing Facility In-house Referral: Clinical Social Work, Chief Executive Officer Acute Care Choice: Skilled Nursing Facility Living arrangements for the past 2 months: Apartment                 DME Arranged: N/A DME Agency: NA                   Social Determinants of Health (SDOH) Interventions    Readmission Risk Interventions No flowsheet data found.

## 2020-11-24 NOTE — Progress Notes (Signed)
Inpatient Rehabilitation Admissions Coordinator    I spoke with patient by phone after she worked with therapy today. She has improved with her mobility . She feels she needs a few more days and then can discharge home with [facility]. Prior to admit she was sleeping on a blow up mattress on the floor. She has a bed scheduled to be delivered on Monday morning. She has a local friend who will be present to accept delivery. She is requesting a RW, wheelchair and BSC for home use. She continues to report difficulty with taking po meds and states she is taking them crushed and has not yet progressed to a regular diet . We again discussed an estimate of cost of a possible CIR admit and she prefers direct discharge home once her bed is delivered on Monday due to the cost of CIR.Marland Kitchen Barrier to discharge is a bed that she can transfer out of  with her knee immobilizer on rather than the blow up mattress on the floor as she had prior to admit. I will discuss with acute team and TOC in the morning.  Ottie Glazier, RN, MSN Rehab Admissions Coordinator [phone] 11/24/2020 8:13 PM

## 2020-11-25 DIAGNOSIS — L89312 Pressure ulcer of right buttock, stage 2: Secondary | ICD-10-CM

## 2020-11-25 DIAGNOSIS — S83105A Unspecified dislocation of left knee, initial encounter: Principal | ICD-10-CM

## 2020-11-25 NOTE — Progress Notes (Signed)
OT Cancellation Note  Patient Details Name: Carla Little MRN: [id number] DOB: [birth year]   Cancelled Treatment:    Reason Eval/Treat Not Completed: Other (comment) Patient in recliner upon arrival. Reports she took a shower today with CNA assistance and "bathed what I could reach." Also just recently used bathroom. Patient politely declines anything in particular she wants to work with OT at this time, feeling a little groggy from pain medication. Reports she has all long handle AE she needs sock aid, grabber, shoe horn, LH sponge, leg lifter. OT will check back as time permits.  Marlyce Huge OT OT pager: [phone]   Carmelia Roller 11/25/2020, 11:42 AM

## 2020-11-25 NOTE — TOC Progression Note (Signed)
Transition of Care [facility]) - Progression Note    Patient Details  Name: RAFIA SHEDDEN MRN: [id number] Date of Birth: Nov 12, 1990  Transition of Care [facility]) CM/SW Contact  Geni Bers, RN Phone Number: 11/25/2020, 3:44 PM  Clinical Narrative:    Unsafe discharge to home. Pt do not have a bed. Pt states that she ordered a bed from [employer]. Bed will not be delivered until Tuesday.    Expected Discharge Plan: Skilled Nursing Facility Barriers to Discharge: Continued Medical Work up, Inadequate or no insurance, SNF Pending bed offer  Expected Discharge Plan and Services Expected Discharge Plan: Skilled Nursing Facility In-house Referral: Clinical Social Work, Chief Executive Officer Acute Care Choice: Skilled Nursing Facility Living arrangements for the past 2 months: Apartment                 DME Arranged: N/A DME Agency: NA                   Social Determinants of Health (SDOH) Interventions    Readmission Risk Interventions No flowsheet data found.

## 2020-11-25 NOTE — Progress Notes (Signed)
PROGRESS NOTE    AIYANA STEGMANN  DGL:[id number] DOB: 11-Jul-1990 DOA: 10/31/2020 PCP: Patient, No Pcp Per (Inactive)    Brief Narrative:  Ms. Delorise Shiner was admitted to the hospital working diagnosis of left knee dislocation, ACL and PCL tear complicated with intractable nausea and vomiting.  30 year old female past medical history for obesity type III and depression who presented to the ED with knee dislocation, admitted to the orthopedic service. Hospitalist team was called due to tachycardia and disposition concerns.  Currently patient is pending to be transferred to skilled nursing facility.  Assessment & Plan:   Active Problems:   Left knee dislocation   Knee dislocation, left, initial encounter   Pressure injury of skin   Left knee dislocation ACL and PCL tear/ obesity class 3. Knee pain is controlled with analgesics, worse with movement. Plan to continue pain control and PT and OT. Not able to afford inpatient rehab. IF continue to progress in her mobility, possible dc home on Monday.   Calculated BMI is 69.1 Stage 2 right buttock pressure ulcer, not clear if present on admission.   2. Intractable nausea and vomiting. Resolving, patient with no further nausea or vomiting, today.  3. Urinary tract infection, complicated with urinary retnetion. Resolved  4. Iron deficiency anemia/ thrombocytosis. Sp IV iron supplementation      Status is: Inpatient  Remains inpatient appropriate because:Inpatient level of care appropriate due to severity of illness  Dispo: The patient is from: Home              Anticipated d/c is to: Home              Patient currently is not medically stable to d/c.   Difficult to place patient No    DVT prophylaxis: Enoxaparin   Code Status:   full  Family Communication:  No family at the bedside      Nutrition Status:           Skin Documentation: Pressure Injury 11/19/20 Buttocks Right Stage 2 -  Partial thickness loss of dermis  presenting as a shallow open injury with a red, pink wound bed without slough. (Active)  11/19/20 2100  Location: Buttocks  Location Orientation: Right  Staging: Stage 2 -  Partial thickness loss of dermis presenting as a shallow open injury with a red, pink wound bed without slough.  Wound Description (Comments):   Present on Admission:      Consultants:  Orthopedics    Subjective: Patient is feeling better, no nausea or vomiting today, her knee pain is controlled and her mobility is improving but not yet back to baseline.   Objective: Vitals:   11/24/20 0923 11/24/20 2047 11/25/20 0552 11/25/20 1000  BP: (!) 102/58 118/69 122/90 108/74  Pulse:  93 91 89  Resp:  16 16   Temp:  98.6 F (37 C) 98.9 F (37.2 C)   TempSrc:  Oral Oral   SpO2: 95% 100% 100%   Weight:      Height:        Intake/Output Summary (Last 24 hours) at 11/25/2020 1808 Last data filed at 11/25/2020 1006 Gross per 24 hour  Intake 1672.09 ml  Output --  Net 1672.09 ml   Filed Weights   11/01/20 0026  Weight: (!) 181.4 kg    Examination:   General: Not in pain or dyspnea, deconditioned  Neurology: Awake and alert, non focal  E ENT: no pallor, no icterus, oral mucosa moist Cardiovascular: No JVD. S1-S2 present,  rhythmic, no gallops, rubs, or murmurs. No lower extremity edema. Pulmonary: positive breath sounds bilaterally, adequate air movement, no wheezing, rhonchi or rales. Gastrointestinal. Abdomen protuberant Skin. No rashes Musculoskeletal: no joint deformities     Data Reviewed: I have personally reviewed following labs and imaging studies  CBC: Recent Labs  Lab 11/20/20 0435  WBC 5.5  HGB 8.3*  HCT 27.8*  MCV 89.7  PLT 428*   Basic Metabolic Panel: Recent Labs  Lab 11/19/20 0837 11/20/20 0826 11/21/20 0337  NA 135 140 139  K 3.4* 3.6 3.6  CL 101 105 107  CO2 25 27 29   GLUCOSE 93 90 79  BUN <5* <5* <5*  CREATININE 0.52 0.58 0.57  CALCIUM 8.3* 8.7* 8.7*  MG 2.0  --  1.9    GFR: Estimated Creatinine Clearance: 169.5 mL/min (by C-G formula based on SCr of 0.57 mg/dL). Liver Function Tests: Recent Labs  Lab 11/19/20 0837 11/20/20 0826 11/21/20 0337  AST 48* 49* 57*  ALT 20 20 22   ALKPHOS 56 53 51  BILITOT 1.4* 1.1 1.0  PROT 6.8 6.8 6.4*  ALBUMIN 2.9* 2.9* 2.7*   No results for input(s): LIPASE, AMYLASE in the last 168 hours. No results for input(s): AMMONIA in the last 168 hours. Coagulation Profile: No results for input(s): INR, PROTIME in the last 168 hours. Cardiac Enzymes: No results for input(s): CKTOTAL, CKMB, CKMBINDEX, TROPONINI in the last 168 hours. BNP (last 3 results) No results for input(s): PROBNP in the last 8760 hours. HbA1C: No results for input(s): HGBA1C in the last 72 hours. CBG: No results for input(s): GLUCAP in the last 168 hours. Lipid Profile: No results for input(s): CHOL, HDL, LDLCALC, TRIG, CHOLHDL, LDLDIRECT in the last 72 hours. Thyroid Function Tests: No results for input(s): TSH, T4TOTAL, FREET4, T3FREE, THYROIDAB in the last 72 hours. Anemia Panel: No results for input(s): VITAMINB12, FOLATE, FERRITIN, TIBC, IRON, RETICCTPCT in the last 72 hours.    Radiology Studies: I have reviewed all of the imaging during this hospital visit personally     Scheduled Meds:  acetaminophen  1,000 mg Oral TID   cholecalciferol  2,000 Units Oral Daily   enoxaparin (LOVENOX) injection  0.5 mg/kg Subcutaneous Q24H   mouth rinse  15 mL Mouth Rinse BID   metoCLOPramide (REGLAN) injection  10 mg Intravenous Q8H   metoprolol tartrate  12.5 mg Oral BID   milk and molasses  1 enema Rectal Once   pantoprazole (PROTONIX) IV  40 mg Intravenous Daily   polyethylene glycol  17 g Oral BID   senna-docusate  2 tablet Oral BID   sucralfate  1 g Oral TID WC & HS   Continuous Infusions:  sodium chloride 75 mL/hr at 11/25/20 0520     LOS: 24 days        Brittnae Aschenbrenner , MD

## 2020-11-25 NOTE — TOC Initial Note (Signed)
Transition of Care [facility]) - Initial/Assessment Note    Patient Details  Name: Carla Little MRN: [id number] Date of Birth: 1990/12/24  Transition [facility]) CM/SW Contact:    Geni Bers, RN Phone Number: 11/25/2020, 2:34 PM  Clinical Narrative:                 Pt will not be a safe discharge today related to her not having a bed at her home.   Expected Discharge Plan: Skilled Nursing Facility Barriers to Discharge: Continued Medical Work up, Inadequate or no insurance, SNF Pending bed offer   Patient Goals and CMS Choice     Choice offered to / list presented to : Patient  Expected Discharge Plan and Services Expected Discharge Plan: Skilled Nursing Facility In-house Referral: Clinical Social Work, Chief Executive Officer Acute Care Choice: Skilled Nursing Facility Living arrangements for the past 2 months: Apartment                 DME Arranged: N/A DME Agency: NA                  Prior Living Arrangements/Services Living arrangements for the past 2 months: Apartment Lives with:: Self Patient language and need for interpreter reviewed:: Yes        Need for Family Participation in Patient Care: Yes (Comment) Care giver support system in place?: No (comment)   Criminal Activity/Legal Involvement Pertinent to Current Situation/Hospitalization: No - Comment as needed  Activities of Daily Living Home Assistive Devices/Equipment: None ADL Screening (condition at time of admission) Patient's cognitive ability adequate to safely complete daily activities?: Yes Is the patient deaf or have difficulty hearing?: No Does the patient have difficulty seeing, even when wearing glasses/contacts?: No Does the patient have difficulty concentrating, remembering, or making decisions?: No Patient able to express need for assistance with ADLs?: Yes Does the patient have difficulty dressing or bathing?: Yes Independently performs ADLs?: No Communication:  Independent Dressing (OT): Needs assistance Is this a change from baseline?: Change from baseline, expected to last >3 days Grooming: Independent Feeding: Needs assistance Is this a change from baseline?: Change from baseline, expected to last >3 days Bathing: Needs assistance Is this a change from baseline?: Change from baseline, expected to last >3 days Toileting: Dependent Is this a change from baseline?: Change from baseline, expected to last >3days In/Out Bed: Dependent Is this a change from baseline?: Change from baseline, expected to last >3 days Walks in Home: Dependent Is this a change from baseline?: Change from baseline, expected to last >3 days Does the patient have difficulty walking or climbing stairs?: Yes (secondary to left leg injury) Weakness of Legs: Left Weakness of Arms/Hands: None  Permission Sought/Granted                  Emotional Assessment Appearance:: Appears stated age     Orientation: : Oriented to Self, Oriented to Place, Oriented to  Time, Oriented to Situation Alcohol / Substance Use: Not Applicable Psych Involvement: No (comment)  Admission diagnosis:  Dislocation closed [T14.8XXA] Fall [W19.XXXA] Left knee dislocation [S83.105A] Dislocation of left knee, initial encounter [S83.105A] Knee dislocation, left, initial encounter [S83.105A] Patient Active Problem List   Diagnosis Date Noted   Pressure injury of skin 11/21/2020   Left knee dislocation 11/01/2020   Knee dislocation, left, initial encounter 11/01/2020   PCP:  Patient, No Pcp Per (Inactive) Pharmacy:   [id number] - [place], [place] - [address] AT  [address] [place] [address] Phone: [phone] Fax: [phone]     Social Determinants of Health (SDOH) Interventions    Readmission Risk Interventions No flowsheet data found.

## 2020-11-25 NOTE — Progress Notes (Signed)
Physical Therapy Treatment Patient Details Name: Carla Little MRN: [id number] DOB: 11-12-1990 Today's Date: 11/25/2020    History of Present Illness Carla Little is an 30 y.o.  who is morbidly obese, history of depression and got up to go the bathroom and felt a pop in her knee and had sudden instability and was unable to get up, EMS transported to the ER and found to have a knee dislocation which was reduced with fractures of the proximal fibula ligamentous avulsion laterally as well as medially off the medial femoral condyle and MRI showed complete ACL tear, PCL tear and MCL strain and has a large effusion and is going to be in knee immobilizer.  Vascular ultrasounds done and nondiagnostic. As of 8/13 she was placed in a L Bledsoe brace with WBAT orders and no flexion of knee.    PT Comments    Seen today to review bed mobility, transfers simulating bed ht for home environment.  Pt has bed and mattress being delivered and put together Monday and was planning for d/c home on this date as well. Currently pt has only an air mattress at home which she will not be able to stand from d/t low height.  Continues to have limited caregiver support but does have family coming in ~ 1 wk to assist.  Pt was able to amb ~ 60' today after above work on bed mobility and transfers. Activity tolerance continues to improved each session. Feel pt will have a safer environment if she should d/c home next wk vs today.  Pt expresses to PT that she cannot afford to live/pay rent etc. if she paid out of pocket for CIR  (~ $10,000).  Will continue to follow in acute setting. Will need DME below including hospital bed if she should d/c over the weekend,   Follow Up Recommendations  Home health PT     Equipment Recommendations  Rolling walker with 5" wheels;3in1 (PT);Wheelchair (measurements PT);Wheelchair cushion (measurements PT);Hospital bed (will need bed if d/c prior to Monday)    Recommendations for  Other Services       Precautions / Restrictions Precautions Precautions: Fall Precaution Comments: NO FLEXION LEFT KNEE, using KI right now, Knee Immobilizer - Left: On at all times Other Brace: ok to loosen Left brace when resting. Per Dr. Magnus Ivan, OK to use  KI vs [facility] . Have modified the KI and added 2 metal stays across the anterior knee(from another  KI.) Restrictions LLE Weight Bearing: Weight bearing as tolerated Other Position/Activity Restrictions: PT orders state attempt WBAT with L Bledsoe brace with no flexion of L knee    Mobility  Bed Mobility Overal bed mobility: Modified Independent Bed Mobility: Supine to Sit;Sit to Supine     Supine to sit: Supervision Sit to supine: Modified independent (Device/Increase time)   General bed mobility comments: uses leg lifter to lift LLE on to bed, able to bring LEs off bed with supervision for safety as bed raised to ht that pt bed will be at home    Transfers Overall transfer level: Needs assistance Equipment used: Rolling walker (2 wheeled) Transfers: Sit to/from Stand Sit to Stand: Supervision         General transfer comment: from bed and recliner; sit to stand supervision from bed at 26" to simulate home environment  Ambulation/Gait Ambulation/Gait assistance: Supervision;Min guard Gait Distance (Feet): 40 Feet Assistive device: Rolling walker (2 wheeled) Gait Pattern/deviations: Step-through pattern     General Gait Details: KI slides  caudally a few inches with gait. chair follow for safety, min/guard to close supervision for safety with amb   Stairs             Wheelchair Mobility    Modified Rankin (Stroke Patients Only)       Balance   Sitting-balance support: No upper extremity supported Sitting balance-Leahy Scale: Good     Standing balance support: During functional activity Standing balance-Leahy Scale: Fair Standing balance comment: able to static stand without UE support                             Cognition Arousal/Alertness: Awake/alert Behavior During Therapy: Anxious Overall Cognitive Status: Within Functional Limits for tasks assessed                                 General Comments: somewhat anxious regarding d/c plan, getting things in place (ie new bed at home, equipment)      Exercises      General Comments        Pertinent Vitals/Pain Pain Assessment: Faces Faces Pain Scale: Hurts a little bit Pain Location: L knee Pain Descriptors / Indicators: Discomfort;Grimacing;Guarding Pain Intervention(s): Limited activity within patient's tolerance;Monitored during session    Home Living                      Prior Function            PT Goals (current goals can now be found in the care plan section) Acute Rehab PT Goals Patient Stated Goal: to get home soon PT Goal Formulation: With patient/family Time For Goal Achievement: 12/01/20 Potential to Achieve Goals: Good Progress towards PT goals: Progressing toward goals    Frequency    Min 5X/week      PT Plan Current plan remains appropriate;Other (comment);Frequency needs to be updated    Co-evaluation              AM-PAC PT "6 Clicks" Mobility   Outcome Measure  Help needed turning from your back to your side while in a flat bed without using bedrails?: None Help needed moving from lying on your back to sitting on the side of a flat bed without using bedrails?: None Help needed moving to and from a bed to a chair (including a wheelchair)?: None Help needed standing up from a chair using your arms (e.g., wheelchair or bedside chair)?: A Little Help needed to walk in hospital room?: A Little Help needed climbing 3-5 steps with a railing? : A Little 6 Click Score: 21    End of Session Equipment Utilized During Treatment: Gait belt;Left knee immobilizer Activity Tolerance: Patient tolerated treatment well Patient left: in chair;with call  bell/phone within reach;with chair alarm set Nurse Communication: Mobility status PT Visit Diagnosis: Unsteadiness on feet (R26.81);History of falling (Z91.81);Pain Pain - Right/Left: Left Pain - part of body: Knee     Time: 1219-1259 PT Time Calculation (min) (ACUTE ONLY): 40 min  Charges:  $Gait Training: 8-22 mins $Therapeutic Activity: 23-37 mins                     Delice Bison, PT  Acute Rehab Dept ([facility]) [phone] Pager [phone]  11/25/2020    [facility] 11/25/2020, 2:10 PM

## 2020-11-26 LAB — CREATININE, SERUM
Creatinine, Ser: 0.46 mg/dL (ref 0.44–1.00)
GFR, Estimated: >60 mL/min (ref >60)

## 2020-11-26 LAB — CBC
HCT: 27.2 % — ABNORMAL LOW (ref 36.0–46.0)
Hemoglobin: 8.2 g/dL — ABNORMAL LOW (ref 12.0–15.0)
MCH: 28.4 pg (ref 26.0–34.0)
MCHC: 30.1 g/dL (ref 30.0–36.0)
MCV: 94.1 fL (ref 80.0–100.0)
Platelets: 348 10*3/uL (ref 150–400)
RBC: 2.89 MIL/uL — ABNORMAL LOW (ref 3.87–5.11)
RDW: 31.6 % — ABNORMAL HIGH (ref 11.5–15.5)
WBC: 4.8 10*3/uL (ref 4.0–10.5)
nRBC: 0 % (ref 0.0–0.2)

## 2020-11-26 MED ORDER — PANTOPRAZOLE SODIUM 40 MG PO TBEC
40.0000 mg | DELAYED_RELEASE_TABLET | Freq: Every day | ORAL | Status: DC
  Administered 2020-11-27 – 2020-11-28 (×2): 40 mg via ORAL
  Filled 2020-11-26 (×2): qty 1

## 2020-11-26 NOTE — Progress Notes (Signed)
PHARMACIST - PHYSICIAN COMMUNICATION  DR:   Lucianne Muss  CONCERNING: IV to Oral Route Change Policy  RECOMMENDATION: This patient is receiving pantoprazole by the intravenous route.  Based on criteria approved by the Pharmacy and Therapeutics Committee, the intravenous medication(s) is/are being converted to the equivalent oral dose form(s).   DESCRIPTION: These criteria include: The patient is eating (either orally or via tube) and/or has been taking other orally administered medications for a least 24 hours The patient has no evidence of active gastrointestinal bleeding or impaired GI absorption (gastrectomy, short bowel, patient on TNA or NPO).  If you have questions about this conversion, please contact the Pharmacy Department  []   [phone] )  ( [phone] []   [phone] )  [facility] []   [phone] )  [facility] []   [phone] )  [facility] [x]   [phone] )  [facility]   ( [phone], [facility] 11/26/2020 12:58 PM

## 2020-11-26 NOTE — Progress Notes (Signed)
Physical Therapy Treatment Patient Details Name: Carla Little MRN: [id number] DOB: 06/17/1990 Today's Date: 11/26/2020    History of Present Illness Carla Little is an 30 y.o.  who is morbidly obese, history of depression and got up to go the bathroom and felt a pop in her knee and had sudden instability and was unable to get up, EMS transported to the ER and found to have a knee dislocation which was reduced with fractures of the proximal fibula ligamentous avulsion laterally as well as medially off the medial femoral condyle and MRI showed complete ACL tear, PCL tear and MCL strain and has a large effusion and is going to be in knee immobilizer.  Vascular ultrasounds done and nondiagnostic. As of 8/13 she was placed in a L Bledsoe brace with WBAT orders and no flexion of knee.    PT Comments    Progressing with PT. Activity tol improving with gait. Reviewed goals and plans for d/c home early next wk as well as long term plans for increasing activity, improving LE strength and wt management. Pt is actively engaged in her wellness plan    Follow Up Recommendations  Home health PT     Equipment Recommendations  Rolling walker with 5" wheels;3in1 (PT);Wheelchair (measurements PT);Wheelchair cushion (measurements PT) (will need bed if d/c prior to Monday)    Recommendations for Other Services       Precautions / Restrictions Precautions Precautions: Fall Precaution Comments: NO FLEXION LEFT KNEE, using KI right now, Knee Immobilizer - Left: On at all times Other Brace: ok to loosen Left brace when resting. Per Dr. Magnus Ivan, OK to use  KI vs [facility] . Have modified the KI and added 2 metal stays across the anterior knee(from another  KI.) Restrictions Weight Bearing Restrictions: No LLE Weight Bearing: Weight bearing as tolerated Other Position/Activity Restrictions: PT orders state attempt WBAT with L Bledsoe brace with no flexion of L knee; KI OK also    Mobility  Bed  Mobility               General bed mobility comments: in chair on arrival and departure    Transfers Overall transfer level: Needs assistance Equipment used: Rolling walker (2 wheeled) Transfers: Sit to/from Stand Sit to Stand: Modified independent (Device/Increase time);Supervision         General transfer comment: from recliner, pushes up on RW without LOB, aware of the need to push down vs safety risk of pulling on RW  Ambulation/Gait Ambulation/Gait assistance: Supervision;Min guard Gait Distance (Feet): 50 Feet (x2) Assistive device: Rolling walker (2 wheeled) Gait Pattern/deviations: Step-through pattern Gait velocity: decr   General Gait Details: KI slides caudally a few inches with gait. chair follow for safety, min/guard to close supervision for safety with amb and chair follow. no rest breaks   Stairs             Wheelchair Mobility    Modified Rankin (Stroke Patients Only)       Balance   Sitting-balance support: No upper extremity supported Sitting balance-Leahy Scale: Good     Standing balance support: During functional activity Standing balance-Leahy Scale: Fair Standing balance comment: able to static stand without UE support                            Cognition Arousal/Alertness: Awake/alert Behavior During Therapy: WFL for tasks assessed/performed Overall Cognitive Status: Within Functional Limits for tasks assessed  General Comments: calm in session today, demonstrates appropriate insight into accomplishments/progress as well as areas for improvement/limitations      Exercises      General Comments        Pertinent Vitals/Pain Pain Assessment: Faces Pain Location: L knee Pain Descriptors / Indicators: Discomfort;Grimacing Pain Intervention(s): Limited activity within patient's tolerance;Monitored during session    Home Living                      Prior  Function            PT Goals (current goals can now be found in the care plan section) Acute Rehab PT Goals Patient Stated Goal: to get home soon PT Goal Formulation: With patient/family Time For Goal Achievement: 12/01/20 Potential to Achieve Goals: Good Progress towards PT goals: Progressing toward goals    Frequency    Min 5X/week      PT Plan Current plan remains appropriate;Other (comment);Frequency needs to be updated    Co-evaluation              AM-PAC PT "6 Clicks" Mobility   Outcome Measure  Help needed turning from your back to your side while in a flat bed without using bedrails?: None Help needed moving from lying on your back to sitting on the side of a flat bed without using bedrails?: None Help needed moving to and from a bed to a chair (including a wheelchair)?: None Help needed standing up from a chair using your arms (e.g., wheelchair or bedside chair)?: A Little Help needed to walk in hospital room?: A Little Help needed climbing 3-5 steps with a railing? : A Little 6 Click Score: 21    End of Session Equipment Utilized During Treatment: Gait belt;Left knee immobilizer Activity Tolerance: Patient tolerated treatment well Patient left: in chair;with call bell/phone within reach;with chair alarm set Nurse Communication: Mobility status PT Visit Diagnosis: Unsteadiness on feet (R26.81);History of falling (Z91.81);Pain Pain - Right/Left: Left Pain - part of body: Knee     Time: 1100-1139 PT Time Calculation (min) (ACUTE ONLY): 39 min  Charges:  $Gait Training: 23-37 mins $Therapeutic Activity: 8-22 mins                     Delice Bison, PT  Acute Rehab Dept ([facility]) [phone] Pager [phone]  11/26/2020    [facility] 11/26/2020, 12:24 PM

## 2020-11-26 NOTE — Progress Notes (Signed)
PROGRESS NOTE    Carla Little  PJA:[id number] DOB: February 13, 1991 DOA: 10/31/2020 PCP: Patient, No Pcp Per (Inactive)  Brief Narrative: This 30 year old female with history of morbid obesity, anxiety/depression, who felt a pop in her left knee when ambulating and suddenly experience instability with resultant mechanical fall.  EMS was called and she was found to have dislocated left knee.  This was recently reduced in the ED, MRI revealed a complete ACL tear, PCL tear and MCL strain as well as large effusion.  Orthopedic surgery was consulted and is managing with brace.  No plan for surgery.  Therapy recommended skilled nursing facility.  Nausea and vomiting has now improved.  Assessment & Plan:   Active Problems:   Left knee dislocation   Knee dislocation, left, initial encounter   Pressure injury of skin   Left knee dislocation /ACL and PCL tear : Patient presented status post fall. MRI reveals ACL and PCL tear/avulsion fracture involving the head of fibula/patellar dislocation/MCL sprain Orthopedics were consulted, No plan for operative management. Bledsoe hinged knee brace with serial exams to ensure that knee remains in place. Continue with PT as tolerated. Continue adequate pain control with pain medications. Not able to afford inpatient rehab.  If she continues to progress in her mobility,  possible DC home on Monday.   Intractable nausea and vomiting. Nausea and vomiting has now resolved.   UTI : She complained of dysuria, UA is abnormal. Urine culture grew Proteus mirabilis sensitive to ceftriaxone. She completed 7 days of ceftriaxone.  Persistent tachycardia D-dimer was elevated, CTA chest ruled out PE Continue metoprolol 12.5 mg every 12 hours daily.  Acute urinary retention >  resolved.   Continue Urecholine   Acute hypoxic respiratory failure: > Resolved Patient would greatly benefit from sleep study outpatient. Concern for sleep apnea.   Iron-deficiency  anemia S/p IV iron supplementation.   Thrombocytosis Likely reactive   Euthyroid sick syndrome TSH 11.895, T4 0.99 Recommend repeat TSH in 4 to 6 weeks as outpatient   Constipation/fecal impaction Noted to have resolved. Pt reports bowel movements Continue Senokot S tablet twice a day, MiraLAX 17 g p.o. twice daily   Class III Morbid obesity Calculated BMI of 69.11 Consider outpatient referral for gastric bypass   DVT prophylaxis: Lovenox Code Status: Full code Family Communication: No family at bedside Disposition Plan:  Status is: Inpatient  Remains inpatient appropriate because:Inpatient level of care appropriate due to severity of illness  Dispo: The patient is from: Home              Anticipated d/c is to:   [facility]              Patient currently is not medically stable to d/c.   Difficult to place patient No   Consultants:  Orthopedics  Procedures:  Antimicrobials:   Anti-infectives (From admission, onward)    Start     Dose/Rate Route Frequency Ordered Stop   11/13/20 1400  ceFAZolin (ANCEF) IVPB 1 g/50 mL premix        1 g 100 mL/hr over 30 Minutes Intravenous Every 8 hours 11/13/20 1153 11/17/20 2240   11/11/20 1130  cefTRIAXone (ROCEPHIN) 1 g in sodium chloride 0.9 % 100 mL IVPB  Status:  Discontinued        1 g 200 mL/hr over 30 Minutes Intravenous Every 24 hours 11/11/20 1033 11/13/20 1153       Subjective: Patient was seen and examined at bedside.  Overnight events noted. Nausea  and vomiting has improved.  She is able to take some solid foods now. Patient is not able to stand , reports pain with movement.  Objective: Vitals:   11/25/20 0552 11/25/20 1000 11/26/20 0559 11/26/20 1249  BP: 122/90 108/74 102/62 107/71  Pulse: 91 89 92 89  Resp: 16  18 16   Temp: 98.9 F (37.2 C)  98.1 F (36.7 C) 98.6 F (37 C)  TempSrc: Oral  Oral Oral  SpO2: 100%  100% 100%  Weight:      Height:        Intake/Output Summary (Last 24 hours) at 11/26/2020  1411 Last data filed at 11/26/2020 1100 Gross per 24 hour  Intake 730 ml  Output 600 ml  Net 130 ml   Filed Weights   11/01/20 0026  Weight: (!) 181.4 kg    Examination:  General exam: Appears comfortable, not in any acute distress. Respiratory system: Clear to auscultation. Respiratory effort normal. Cardiovascular system: S1 & S2 heard, RRR. No JVD, murmurs, rubs, gallops or clicks. No pedal edema. Gastrointestinal system: Abdomen is soft, nontender, nondistended.  BS+ Central nervous system: Alert and oriented X 3. No focal neurological deficits. Extremities: Left knee in brace.  Reports tenderness. Skin: No rashes, lesions or ulcers Psychiatry: Judgement and insight appear normal. Mood & affect appropriate.     Data Reviewed: I have personally reviewed following labs and imaging studies  CBC: Recent Labs  Lab 11/20/20 0435 11/26/20 0407  WBC 5.5 4.8  HGB 8.3* 8.2*  HCT 27.8* 27.2*  MCV 89.7 94.1  PLT 428* 348   Basic Metabolic Panel: Recent Labs  Lab 11/20/20 0826 11/21/20 0337 11/26/20 0407  NA 140 139  --   K 3.6 3.6  --   CL 105 107  --   CO2 27 29  --   GLUCOSE 90 79  --   BUN <5* <5*  --   CREATININE 0.58 0.57 0.46  CALCIUM 8.7* 8.7*  --   MG  --  1.9  --    GFR: Estimated Creatinine Clearance: 169.5 mL/min (by C-G formula based on SCr of 0.46 mg/dL). Liver Function Tests: Recent Labs  Lab 11/20/20 0826 11/21/20 0337  AST 49* 57*  ALT 20 22  ALKPHOS 53 51  BILITOT 1.1 1.0  PROT 6.8 6.4*  ALBUMIN 2.9* 2.7*   No results for input(s): LIPASE, AMYLASE in the last 168 hours. No results for input(s): AMMONIA in the last 168 hours. Coagulation Profile: No results for input(s): INR, PROTIME in the last 168 hours. Cardiac Enzymes: No results for input(s): CKTOTAL, CKMB, CKMBINDEX, TROPONINI in the last 168 hours. BNP (last 3 results) No results for input(s): PROBNP in the last 8760 hours. HbA1C: No results for input(s): HGBA1C in the last 72  hours. CBG: No results for input(s): GLUCAP in the last 168 hours. Lipid Profile: No results for input(s): CHOL, HDL, LDLCALC, TRIG, CHOLHDL, LDLDIRECT in the last 72 hours. Thyroid Function Tests: No results for input(s): TSH, T4TOTAL, FREET4, T3FREE, THYROIDAB in the last 72 hours. Anemia Panel: No results for input(s): VITAMINB12, FOLATE, FERRITIN, TIBC, IRON, RETICCTPCT in the last 72 hours. Sepsis Labs: No results for input(s): PROCALCITON, LATICACIDVEN in the last 168 hours.  No results found for this or any previous visit (from the past 240 hour(s)).   Radiology Studies: No results found.   Scheduled Meds:  acetaminophen  1,000 mg Oral TID   cholecalciferol  2,000 Units Oral Daily   enoxaparin (LOVENOX) injection  0.5 mg/kg Subcutaneous Q24H   mouth rinse  15 mL Mouth Rinse BID   metoCLOPramide (REGLAN) injection  10 mg Intravenous Q8H   metoprolol tartrate  12.5 mg Oral BID   milk and molasses  1 enema Rectal Once   [START ON 11/27/2020] pantoprazole  40 mg Oral Daily   polyethylene glycol  17 g Oral BID   senna-docusate  2 tablet Oral BID   sucralfate  1 g Oral TID WC & HS   Continuous Infusions:     LOS: 25 days    Time spent: 25 mins    Cipriano Bunker, MD [facility]   If 7PM-7AM, please contact night-coverage

## 2020-11-27 NOTE — Progress Notes (Signed)
Physical Therapy Treatment Patient Details Name: Carla Little MRN: [id number] DOB: 11/15/1990 Today's Date: 11/27/2020    History of Present Illness Carla Little is an 30 y.o.  who is morbidly obese, history of depression and got up to go the bathroom and felt a pop in her knee and had sudden instability and was unable to get up, EMS transported to the ER and found to have a knee dislocation which was reduced with fractures of the proximal fibula ligamentous avulsion laterally as well as medially off the medial femoral condyle and MRI showed complete ACL tear, PCL tear and MCL strain and has a large effusion and is going to be in knee immobilizer.  Vascular ultrasounds done and nondiagnostic. As of 8/13 she was placed in a L Bledsoe brace with WBAT orders and no flexion of knee.    PT Comments    Continues to progress toward goals. Incr total gait distance today with mild dyspnea and seated rest breaks needed. Pt is amb short household distances. KI continues to slide with amb, pt is aware to focus on L knee extension throughout gait cycle. No knee instability noted during gait. Pt is hopdful to go home early this wk once her bed is delivered and set up, and she has DME --will at least need a bariatric RW for home and a 3in1  Follow Up Recommendations  Home health PT     Equipment Recommendations  Rolling walker with 5" wheels;3in1 (PT);Wheelchair (measurements PT);Wheelchair cushion (measurements PT) (will need bed if d/c prior to Monday)    Recommendations for Other Services       Precautions / Restrictions Precautions Precautions: Fall Precaution Comments: NO FLEXION LEFT KNEE, using KI right now, Knee Immobilizer - Left: On at all times Other Brace: ok to loosen Left brace when resting. Per Dr. Magnus Ivan, OK to use  KI vs [facility] . Have modified the KI and added 2 metal stays across the anterior knee(from another  KI.) Restrictions Weight Bearing Restrictions: No LLE Weight  Bearing: Weight bearing as tolerated Other Position/Activity Restrictions: PT orders state attempt WBAT with L Bledsoe brace with no flexion of L knee; KI OK also    Mobility  Bed Mobility Overal bed mobility: Modified Independent Bed Mobility: Supine to Sit     Supine to sit: Modified independent (Device/Increase time)     General bed mobility comments: no assist needed to come to sitting EOB    Transfers Overall transfer level: Needs assistance Equipment used: Rolling walker (2 wheeled) Transfers: Sit to/from Stand Sit to Stand: Modified independent (Device/Increase time)         General transfer comment: from recliner, pushes up on RW without LOB, aware of the need to push down vs safety risk of pulling on RW. repeated sit<>stand trials from recliner, no phsyical assist needed  Ambulation/Gait Ambulation/Gait assistance: Supervision;Min guard Gait Distance (Feet): 50 Feet (and 40' x 2) Assistive device: Rolling walker (2 wheeled) Gait Pattern/deviations: Step-through pattern Gait velocity: decr   General Gait Details: KI slides caudally a few inches with gait. chair follow for safety, min/guard to close supervision. brief seatesd rest between distances above   Social research officer, government Rankin (Stroke Patients Only)       Balance   Sitting-balance support: No upper extremity supported Sitting balance-Leahy Scale: Good     Standing balance support: During functional activity Standing balance-Leahy Scale: Fair Standing  balance comment: able to static stand without UE support                            Cognition Arousal/Alertness: Awake/alert Behavior During Therapy: WFL for tasks assessed/performed Overall Cognitive Status: Within Functional Limits for tasks assessed                                 General Comments: calm in session today, demonstrates appropriate insight into accomplishments/progress  as well as areas for improvement/limitations      Exercises      General Comments        Pertinent Vitals/Pain Pain Assessment: 0-10 Pain Score: 2  Pain Location: L knee Pain Descriptors / Indicators: Discomfort Pain Intervention(s): Limited activity within patient's tolerance;Monitored during session;Premedicated before session;Repositioned    Home Living                      Prior Function            PT Goals (current goals can now be found in the care plan section) Acute Rehab PT Goals Patient Stated Goal: to get home soon PT Goal Formulation: With patient/family Time For Goal Achievement: 12/01/20 Potential to Achieve Goals: Good Progress towards PT goals: Progressing toward goals    Frequency    Min 5X/week      PT Plan Current plan remains appropriate;Other (comment);Frequency needs to be updated    Co-evaluation              AM-PAC PT "6 Clicks" Mobility   Outcome Measure  Help needed turning from your back to your side while in a flat bed without using bedrails?: None Help needed moving from lying on your back to sitting on the side of a flat bed without using bedrails?: None Help needed moving to and from a bed to a chair (including a wheelchair)?: None Help needed standing up from a chair using your arms (e.g., wheelchair or bedside chair)?: A Little Help needed to walk in hospital room?: A Little Help needed climbing 3-5 steps with a railing? : A Little 6 Click Score: 21    End of Session Equipment Utilized During Treatment: Gait belt;Left knee immobilizer Activity Tolerance: Patient tolerated treatment well Patient left: in chair;with call bell/phone within reach Nurse Communication: Mobility status PT Visit Diagnosis: Unsteadiness on feet (R26.81);History of falling (Z91.81);Pain Pain - Right/Left: Left Pain - part of body: Knee     Time: 1130-1203 PT Time Calculation (min) (ACUTE ONLY): 33 min  Charges:  $Gait Training:  23-37 mins                     Delice Bison, PT  Acute Rehab Dept ([facility]) [phone] Pager [phone]  11/27/2020    [facility] 11/27/2020, 1:27 PM

## 2020-11-27 NOTE — Progress Notes (Signed)
PROGRESS NOTE    SARAH-JANE NAZARIO  WUJ:[id number] DOB: [birth year] DOA: 10/31/2020 PCP: Patient, No Pcp Per (Inactive)  Brief Narrative: This 30 year old female with history of morbid obesity, anxiety/depression, who felt a pop in her left knee when ambulating and suddenly experience instability with resultant mechanical fall.  EMS was called and she was found to have dislocated left knee.  This was recently reduced in the ED, MRI revealed a complete ACL tear, PCL tear and MCL strain as well as large effusion.  Orthopedic surgery was consulted and is managing with brace.  No plan for surgery.  Therapy recommended skilled nursing facility.  Nausea and vomiting has now improved.  Assessment & Plan:   Active Problems:   Left knee dislocation   Knee dislocation, left, initial encounter   Pressure injury of skin   Left knee dislocation /ACL and PCL tear : Patient presented status post fall. MRI reveals ACL and PCL tear/avulsion fracture involving the head of fibula/patellar dislocation/MCL sprain Orthopedics were consulted, No plan for operative management. Bledsoe hinged knee brace with serial exams to ensure that knee remains in place. Continue with PT as tolerated. Continue adequate pain control with pain medications. Not able to afford inpatient rehab.  If she continues to progress in her mobility,  possible DC home on Monday.   Intractable nausea and vomiting. Nausea and vomiting has now resolved.   UTI : She complained of dysuria, UA is abnormal. Urine culture grew Proteus mirabilis sensitive to ceftriaxone. She completed 7 days of ceftriaxone.  Persistent tachycardia D-dimer was elevated, CTA chest ruled out PE Continue metoprolol 12.5 mg every 12 hours daily.  Acute urinary retention >  resolved.   Continue Urecholine   Acute hypoxic respiratory failure: > Resolved Patient would greatly benefit from sleep study outpatient. Concern for sleep apnea.   Iron-deficiency  anemia S/p IV iron supplementation.   Thrombocytosis Likely reactive   Euthyroid sick syndrome TSH 11.895, T4 0.99 Recommend repeat TSH in 4 to 6 weeks as outpatient   Constipation/fecal impaction Noted to have resolved. Pt reports bowel movements Continue Senokot S tablet twice a day, MiraLAX 17 g p.o. twice daily   Class III Morbid obesity Calculated BMI of 69.11 Consider outpatient referral for gastric bypass   DVT prophylaxis: Lovenox Code Status: Full code Family Communication: No family at bedside Disposition Plan:  Status is: Inpatient  Remains inpatient appropriate because:Inpatient level of care appropriate due to severity of illness  Dispo: The patient is from: Home              Anticipated d/c is to:   [facility] 9/5              Patient currently is not medically stable to d/c.   Difficult to place patient No   Consultants:  Orthopedics  Procedures:  Antimicrobials:   Anti-infectives (From admission, onward)    Start     Dose/Rate Route Frequency Ordered Stop   11/13/20 1400  ceFAZolin (ANCEF) IVPB 1 g/50 mL premix        1 g 100 mL/hr over 30 Minutes Intravenous Every 8 hours 11/13/20 1153 11/17/20 2240   11/11/20 1130  cefTRIAXone (ROCEPHIN) 1 g in sodium chloride 0.9 % 100 mL IVPB  Status:  Discontinued        1 g 200 mL/hr over 30 Minutes Intravenous Every 24 hours 11/11/20 1033 11/13/20 1153       Subjective: Patient was seen and examined at bedside.  Overnight events noted.  Patient reported nausea and vomiting has resolved,  she is feeling little better. Patient is not able to stand , reports pain with movement.  Objective: Vitals:   11/26/20 0559 11/26/20 1249 11/26/20 1947 11/27/20 0432  BP: 102/62 107/71 95/72 101/63  Pulse: 92 89 93 95  Resp: 18 16 18 18   Temp: 98.1 F (36.7 C) 98.6 F (37 C) 97.8 F (36.6 C) 98.1 F (36.7 C)  TempSrc: Oral Oral Oral Oral  SpO2: 100% 100% 100% 97%  Weight:      Height:        Intake/Output  Summary (Last 24 hours) at 11/27/2020 1326 Last data filed at 11/26/2020 1901 Gross per 24 hour  Intake 200 ml  Output --  Net 200 ml    Filed Weights   11/01/20 0026  Weight: (!) 181.4 kg    Examination:  General exam: Appears comfortable, not in any acute distress. Respiratory system: Clear to auscultation. Respiratory effort normal. Cardiovascular system: S1 & S2 heard, RRR. No JVD, murmurs, rubs, gallops or clicks. No pedal edema. Gastrointestinal system: Abdomen is soft, nontender, nondistended.  BS+ Central nervous system: Alert and oriented X 3. No focal neurological deficits. Extremities: Left knee in brace.  Reports tenderness. Skin: No rashes, lesions or ulcers Psychiatry: Judgement and insight appear normal. Mood & affect appropriate.     Data Reviewed: I have personally reviewed following labs and imaging studies  CBC: Recent Labs  Lab 11/26/20 0407  WBC 4.8  HGB 8.2*  HCT 27.2*  MCV 94.1  PLT 348    Basic Metabolic Panel: Recent Labs  Lab 11/21/20 0337 11/26/20 0407  NA 139  --   K 3.6  --   CL 107  --   CO2 29  --   GLUCOSE 79  --   BUN <5*  --   CREATININE 0.57 0.46  CALCIUM 8.7*  --   MG 1.9  --     GFR: Estimated Creatinine Clearance: 169.5 mL/min (by C-G formula based on SCr of 0.46 mg/dL). Liver Function Tests: Recent Labs  Lab 11/21/20 0337  AST 57*  ALT 22  ALKPHOS 51  BILITOT 1.0  PROT 6.4*  ALBUMIN 2.7*    No results for input(s): LIPASE, AMYLASE in the last 168 hours. No results for input(s): AMMONIA in the last 168 hours. Coagulation Profile: No results for input(s): INR, PROTIME in the last 168 hours. Cardiac Enzymes: No results for input(s): CKTOTAL, CKMB, CKMBINDEX, TROPONINI in the last 168 hours. BNP (last 3 results) No results for input(s): PROBNP in the last 8760 hours. HbA1C: No results for input(s): HGBA1C in the last 72 hours. CBG: No results for input(s): GLUCAP in the last 168 hours. Lipid Profile: No  results for input(s): CHOL, HDL, LDLCALC, TRIG, CHOLHDL, LDLDIRECT in the last 72 hours. Thyroid Function Tests: No results for input(s): TSH, T4TOTAL, FREET4, T3FREE, THYROIDAB in the last 72 hours. Anemia Panel: No results for input(s): VITAMINB12, FOLATE, FERRITIN, TIBC, IRON, RETICCTPCT in the last 72 hours. Sepsis Labs: No results for input(s): PROCALCITON, LATICACIDVEN in the last 168 hours.  No results found for this or any previous visit (from the past 240 hour(s)).   Radiology Studies: No results found.   Scheduled Meds:  acetaminophen  1,000 mg Oral TID   cholecalciferol  2,000 Units Oral Daily   enoxaparin (LOVENOX) injection  0.5 mg/kg Subcutaneous Q24H   mouth rinse  15 mL Mouth Rinse BID   metoCLOPramide (REGLAN) injection  10 mg Intravenous  Q8H   metoprolol tartrate  12.5 mg Oral BID   milk and molasses  1 enema Rectal Once   pantoprazole  40 mg Oral Daily   polyethylene glycol  17 g Oral BID   senna-docusate  2 tablet Oral BID   sucralfate  1 g Oral TID WC & HS   Continuous Infusions:     LOS: 26 days    Time spent: 25 mins    Cipriano Bunker, MD [facility]   If 7PM-7AM, please contact night-coverage

## 2020-11-28 MED ORDER — CEPHALEXIN 500 MG PO CAPS: 500.0000 mg | ORAL_CAPSULE | Freq: Two times a day (BID) | ORAL | 0 refills | Status: AC

## 2020-11-28 MED ORDER — OXYCODONE HCL 5 MG PO TABS: 5.0000 mg | ORAL_TABLET | ORAL | 0 refills | Status: DC | PRN

## 2020-11-28 MED ORDER — VITAMIN D3 25 MCG PO TABS: 2000.0000 [IU] | ORAL_TABLET | Freq: Every day | ORAL | 1 refills | Status: DC

## 2020-11-28 MED ORDER — ONDANSETRON HCL 4 MG PO TABS
4.0000 mg | ORAL_TABLET | Freq: Four times a day (QID) | ORAL | 0 refills | Status: DC | PRN
Start: 2020-11-28 — End: 2021-01-26

## 2020-11-28 MED ORDER — METHOCARBAMOL 500 MG PO TABS: 500.0000 mg | ORAL_TABLET | Freq: Four times a day (QID) | ORAL | 0 refills | Status: AC | PRN

## 2020-11-28 MED ORDER — METOPROLOL TARTRATE 25 MG PO TABS
12.5000 mg | ORAL_TABLET | Freq: Two times a day (BID) | ORAL | 1 refills | Status: DC
Start: 2020-11-28 — End: 2021-01-26

## 2020-11-28 NOTE — Discharge Summary (Signed)
Physician Discharge Summary  Carla Little:[id number] DOB: Dec 20, 1990 DOA: 10/31/2020  PCP: Patient, No Pcp Per (Inactive)  Admit date: 10/31/2020  Discharge date: 11/28/2020  Admitted From: Home.  Disposition:  Home health services  Recommendations for Outpatient Follow-up:  Follow up with PCP in 1-2 weeks Please obtain BMP/CBC in one week Advised to follow-up with orthopedics in 2 weeks.  Home Health:Home PT Equipment/Devices: Hospital bed, wheeled walker, bedside commode  Discharge Condition: Good CODE STATUS:Full code Diet recommendation: Carb modified diet.  Brief Summary / Hospital Course: This 30 year old female with history of morbid obesity, anxiety/depression, who felt a pop in her left knee when ambulating and suddenly experience instability with resultant mechanical fall.  EMS was called and she was found to have dislocated left knee.  This was recently reduced in the ED, MRI revealed a complete ACL tear, PCL tear and MCL strain as well as large effusion.  Orthopedic surgery was consulted and is managing with brace.  There is no plan for surgery.  Therapy recommended inpatient rehab but patient could not afford rehab skilled nursing facility.  Patient has also developed intractable nausea and vomiting which has subsequently resolved with IV Zofran and Reglan.  Patient has improvement in her mobility.  Patient wants to go home and home health services been arranged.  Hospital bed,  walker and bedside commode be arranged.  Patient is cleared from Ortho with follow-up in 2 weeks.  Patient was treated with antibiotics for UTI. Patient reported intermittent symptoms, being discharged on Keflex a few days.   She was managed for below problems.   Discharge Diagnoses:  Active Problems:   Left knee dislocation   Knee dislocation, left, initial encounter   Pressure injury of skin  Left knee dislocation /ACL and PCL tear : Patient presented status post fall. MRI reveals ACL  and PCL tear/avulsion fracture involving the head of fibula/patellar dislocation/MCL sprain Orthopedics were consulted, No plan for operative management. Bledsoe hinged knee brace with serial exams to ensure that knee remains in place. Continue with PT as tolerated. Continue adequate pain control with pain medications. Not able to afford inpatient rehab. she continued to progress in her mobility,   Patient is cleared from Ortho to be discharged and follow-up outpatient. Patient is being discharged home, hospital bed, bedside commode and walker arranged.   Intractable nausea and vomiting. Nausea and vomiting has now resolved.   UTI : She complained of dysuria, UA is abnormal. Urine culture grew Proteus mirabilis sensitive to ceftriaxone. She completed 7 days of ceftriaxone.   Persistent tachycardia D-dimer was elevated, CTA chest ruled out PE Continue metoprolol 12.5 mg every 12 hours daily.   Acute urinary retention >  resolved.   Continue Urecholine   Acute hypoxic respiratory failure: > Resolved Patient would greatly benefit from sleep study outpatient. Concern for sleep apnea.   Iron-deficiency anemia S/p IV iron supplementation.   Thrombocytosis Likely reactive.   Euthyroid sick syndrome TSH 11.895, T4 0.99 Recommend repeat TSH in 4 to 6 weeks as outpatient   Constipation/fecal impaction Noted to have resolved. Pt reports bowel movements Continue Senokot S tablet twice a day, MiraLAX 17 g p.o. twice daily   Class III Morbid obesity Calculated BMI of 69.11 Consider outpatient referral for gastric bypass  Discharge Instructions  Discharge Instructions     Call MD for:  difficulty breathing, headache or visual disturbances   Complete by: As directed    Call MD for:  persistant dizziness or light-headedness  Complete by: As directed    Diet - low sodium heart healthy   Complete by: As directed    Diet Carb Modified   Complete by: As directed    Discharge  wound care:   Complete by: As directed    F/u Orthopaedics.   Increase activity slowly   Complete by: As directed       Allergies as of 11/28/2020       Reactions   Azithromycin Shortness Of Breath, Nausea And Vomiting        Medication List     TAKE these medications    cephALEXin 500 MG capsule Commonly known as: KEFLEX Take 1 capsule (500 mg total) by mouth 2 (two) times daily for 5 days.   ibuprofen 200 MG tablet Commonly known as: ADVIL Take 400 mg by mouth 2 (two) times daily as needed for headache, moderate pain or cramping.   methocarbamol 500 MG tablet Commonly known as: ROBAXIN Take 1 tablet (500 mg total) by mouth every 6 (six) hours as needed for muscle spasms.   metoprolol tartrate 25 MG tablet Commonly known as: LOPRESSOR Take 0.5 tablets (12.5 mg total) by mouth 2 (two) times daily.   ondansetron 4 MG tablet Commonly known as: ZOFRAN Take 1 tablet (4 mg total) by mouth every 6 (six) hours as needed for nausea.   oxyCODONE 5 MG immediate release tablet Commonly known as: Oxy IR/ROXICODONE Take 1 tablet (5 mg total) by mouth every 4 (four) hours as needed for moderate pain (pain score 4-6).   Vitamin D3 25 MCG tablet Commonly known as: Vitamin D Take 2 tablets (2,000 Units total) by mouth daily.               Durable Medical Equipment  (From admission, onward)           Start     Ordered   11/28/20 0926  For home use only DME Bedside commode  Once       Question:  Patient needs a bedside commode to treat with the following condition  Answer:  Left knee dislocation   11/28/20 0925   11/28/20 0925  For home use only DME Walker rolling  Once       Question Answer Comment  Walker: With 5 Inch Wheels   Patient needs a walker to treat with the following condition Generalized weakness      11/28/20 0925              Discharge Care Instructions  (From admission, onward)           Start     Ordered   11/28/20 0000  Discharge  wound care:       Comments: F/u Orthopaedics.   11/28/20 7829            Follow-up Information     Kathryne Hitch, MD. Schedule an appointment as soon as possible for a visit in 2 week(s).   Specialty: Orthopedic Surgery Contact information: [address] [place] [address] [phone]                Allergies  Allergen Reactions   Azithromycin Shortness Of Breath and Nausea And Vomiting    Consultations: Orthopedics   Procedures/Studies: DG Tibia/Fibula Left  Result Date: 11/01/2020 CLINICAL DATA:  Trip and fall and heard a crack. Pain mostly about the knee and proximal lower leg. EXAM: LEFT TIBIA AND FIBULA - 2 VIEW COMPARISON:  None. FINDINGS: There is posterior dislocation  of the femur with respect to the tibia. Apex lateral angulation. Patella appears aligned with the distal femur. Suspected fracture about the proximal fibular neck. Ankle alignment and ankle mortise are preserved. Mild generalized soft tissue edema. IMPRESSION: Posterior dislocation of the femur with respect to the tibia. Suspected fracture about the proximal fibular neck. Electronically Signed   By: Narda Rutherford M.D.   On: 11/01/2020 00:03   DG Abd 1 View  Result Date: 11/14/2020 CLINICAL DATA:  Constipation. EXAM: ABDOMEN - 1 VIEW COMPARISON:  Abdominal radiograph dated 11/12/2020. FINDINGS: Evaluation is very limited due to body habitus. No bowel dilatation. No significant stool burden. The osseous structures are intact. IMPRESSION: Negative. Electronically Signed   By: Elgie Collard M.D.   On: 11/14/2020 03:30   CT Angio Chest Pulmonary Embolism (PE) W or WO Contrast  Result Date: 11/03/2020 CLINICAL DATA:  Elevated D-dimer level. EXAM: CT ANGIOGRAPHY CHEST WITH CONTRAST TECHNIQUE: Multidetector CT imaging of the chest was performed using the standard protocol during bolus administration of intravenous contrast. Multiplanar CT image reconstructions and MIPs were obtained to  evaluate the vascular anatomy. CONTRAST:  OMNIPAQUE IOHEXOL 350 MG/ML SOLN COMPARISON:  June 13, 2017. FINDINGS: Cardiovascular: There is no evidence of large central pulmonary embolus in the main pulmonary artery or main portions of the left and right pulmonary arteries. Evaluation of the lower lobe branches is limited due to attenuation due to body habitus, and smaller and more peripheral pulmonary emboli cannot be excluded on the basis of this exam. Normal cardiac size. No pericardial effusion. Mediastinum/Nodes: Stable mildly prominent thymic tissue. No adenopathy is noted. Esophagus and thyroid gland are unremarkable. Lungs/Pleura: No pneumothorax or pleural effusion is noted. Increased bilateral lower lobe airspace opacities are noted concerning for atelectasis or possibly pneumonia. Upper Abdomen: Hepatic steatosis. Musculoskeletal: No chest wall abnormality. No acute or significant osseous findings. Review of the MIP images confirms the above findings. IMPRESSION: No evidence of large central pulmonary embolus is noted in the main pulmonary artery or main portions of the Roux left and right pulmonary arteries. However, evaluation of the lower lobe branches is significantly limited on this exam due to the attenuation from body habitus, and smaller and more peripheral pulmonary emboli cannot be excluded on the basis of this exam. Increased bilateral lower lobe airspace opacities are noted concerning for atelectasis or possibly pneumonia. Hepatic steatosis. Electronically Signed   By: Lupita Raider M.D.   On: 11/03/2020 20:10   CT ANGIO LOW EXTREM LEFT W &/OR WO CONTRAST  Result Date: 11/01/2020 CLINICAL DATA:  30 year old female status post left knee fracture dislocation last night after fall from bed. Status post reduction. EXAM: CT ANGIOGRAPHY LOWER LEFT EXTREMITY TECHNIQUE: Multi detector CT angiogram of the left lower extremity during bolus contrast administration. CONTRAST:  200 mL OMNIPAQUE  IOHEXOL 350 MG/ML SOLN COMPARISON:  Left tib fib series 2339 hours yesterday and left knee series 0230 hours today. FINDINGS: Despite two separate contrast bolus attempts, there is poor arterial contrast bolus in the left lower extremity on this exam. The major arterial structures of the left lower extremity appear grossly patent. Excreted IV contrast is present in the urinary bladder on the 2nd attempt. No pelvic free fluid. Negative visible lower abdominal and pelvic viscera. Visible bony pelvis appears intact. Left femoral head normally located. Comminuted but largely nondisplaced fracture of the medial distal right femur at the condyle is in the area of the medial collateral ligament and appears to spare the articular  surface as seen on series 7, image 117 and series 5, image 184. No knee joint effusion. Patella appears to be intact although is laterally subluxed on series 5, image 181. There is mild comminution with tiny bone fragments at the lateral tibial plateau relatively sparing the articular surface as seen on series 7, image 113. Otherwise the tibia is intact. Comminuted relatively nondisplaced fracture of the fibula head. Otherwise the fibula is intact. Ankle joint alignment is preserved. There is soft tissue swelling and stranding along the superficial muscle air in subcutaneous tissues at the lateral joint space and tracking into the posterior proximal calf. No soft tissue gas identified. Review of the MIP images confirms the above findings. IMPRESSION: 1. Nondiagnostic CTA of the left lower extremity despite two separate contrast bolus attempts. 2. Comminuted but largely nondisplaced fractures of both the medial distal right femur and the lateral proximal tibia. Both fracture sites relatively spare the articular surfaces and may be sequelae of collateral ligamentous avulsion. 3. Lateral patellar subluxation but no patellar fracture. 4. Mildly comminuted but relatively nondisplaced fibular head  fracture. 5. No knee joint effusion. Posttraumatic soft tissue swelling and stranding with no soft tissue gas. Electronically Signed   By: Odessa Fleming M.D.   On: 11/01/2020 05:59   MR KNEE LEFT WO CONTRAST  Result Date: 11/01/2020 CLINICAL DATA:  Larey Seat.  Knee dislocation. EXAM: MRI OF THE LEFT KNEE WITHOUT CONTRAST TECHNIQUE: Multiplanar, multisequence MR imaging of the knee was performed. No intravenous contrast was administered. COMPARISON:  Radiographs 11/01/2020 FINDINGS: Exam quite limited by body habitus and patient motion artifact. MENISCI Medial meniscus:  Grossly intact. Lateral meniscus:  Grossly intact. LIGAMENTS Cruciates: Complete ACL tear. Suspect PCL torn from its femoral attachment site. Collaterals: Proximal MCL strain. No complete tear. The fibular collateral ligament is torn. The iliotibial band is intact and the biceps femoris tendon is injured but not completely ruptured. The popliteus tendon is injured but I believe it is still intact. Suspect arcuate ligament tear and posterolateral corner injury. CARTILAGE Patellofemoral:  Grossly normal. Medial:  Intact Lateral:  Intact Joint:  Moderate to large joint effusion. Popliteal Fossa: Extensive fluid/hematoma and edema involving the posterior compartment of the knee with medial and lateral gastroc muscle tears. Extensor Mechanism: The patella is significantly subluxed laterally. The medial retinaculum and medial patellofemoral ligaments are torn from the femoral attachment site. The TT-TG distance is 3 cm. Bones: Bone contusion involving the medial femoral condyle. There is also an avulsion type fracture involving the fibular head. Other: Medial and lateral gastroc muscle tears. The popliteus muscle is also torn and the anterior tibialis muscle is torn. IMPRESSION: 1. Exam quite limited by body habitus and patient motion artifact. 2. Extensive knee injury including ACL and PCL tears, posterolateral corner injury, patellar dislocation injury, MCL  sprain, multiple muscle tears, medial femoral bone contusion an avulsion fracture involving the fibular head. 3. No obvious meniscal tears but this could certainly be missed. 4. Large joint effusion. Electronically Signed   By: Rudie Meyer M.D.   On: 11/01/2020 08:28   DG Knee Left Port  Result Date: 11/21/2020 CLINICAL DATA:  Knee injury, fall a couple weeks ago EXAM: PORTABLE LEFT KNEE - 1-2 VIEW COMPARISON:  None. FINDINGS: No evidence of fracture, dislocation, or joint effusion. No evidence of arthropathy or other focal bone abnormality. Soft tissues are unremarkable. IMPRESSION: No fracture or dislocation of the left knee. Joint spaces are preserved. Electronically Signed   By: Erasmo Score.D.  On: 11/21/2020 19:15   DG Knee Left Port  Result Date: 11/12/2020 CLINICAL DATA:  Fall 2 weeks ago with knee dislocation, now with persistent anterior knee pain. EXAM: PORTABLE LEFT KNEE - 1-2 VIEW COMPARISON:  11/01/2020; left tibia and fibular radiographs-10/31/2020; left knee MRI-11/01/2020 FINDINGS: Redemonstrated minimally displaced fractures involving the head of the fibula as well as the medial aspect of the distal femur. Joint spaces are preserved. Suspected small knee joint effusion. No definite evidence of lipohemarthrosis. No radiopaque foreign body. IMPRESSION: Redemonstrated minimally displaced fractures involving the head of the fibula as well as the medial aspect of the distal femur. Electronically Signed   By: Simonne Come M.D.   On: 11/12/2020 11:49   DG Knee Left Port  Result Date: 11/06/2020 CLINICAL DATA:  Recent knee dislocation, ongoing knee pain. EXAM: PORTABLE LEFT KNEE - 1-2 VIEW COMPARISON:  CT and radiographs of the knee dated 11/01/2020. FINDINGS: Again seen are fractures of the proximal tibia laterally, the fibular head, and the medial distal femur. No evidence of new fracture, dislocation, or joint effusion. No evidence of arthropathy or other focal bone abnormality. Soft  tissues are unremarkable. IMPRESSION: No new acute osseous injury since recent fractures of 10/31/2020. Electronically Signed   By: Romona Curls M.D.   On: 11/06/2020 15:06   DG Knee Left Port  Result Date: 11/01/2020 CLINICAL DATA:  Status post reduction EXAM: PORTABLE LEFT KNEE - 1-2 VIEW COMPARISON:  Films from previous day. FINDINGS: Prior knee dislocation has been reduced. The tibia and fibula demonstrate normal articulation. Avulsion is noted from the medial aspect of the distal femur as well as fracture through the fibular head. IMPRESSION: Interval reduction of dislocation. Proximal fibular fracture is again noted. Avulsion from the medial aspect of the distal femur is noted. Electronically Signed   By: Alcide Clever M.D.   On: 11/01/2020 02:45   DG Abd Portable 1V  Result Date: 11/12/2020 CLINICAL DATA:  Abdominal pain EXAM: PORTABLE ABDOMEN - 1 VIEW COMPARISON:  None. FINDINGS: The bowel gas pattern is normal. No radio-opaque calculi or other significant radiographic abnormality are seen. IMPRESSION: No evidence of bowel obstruction. Electronically Signed   By: Caprice Renshaw M.D.   On: 11/12/2020 18:24   DG Foot 2 Views Left  Result Date: 11/01/2020 CLINICAL DATA:  Fall, pain, injury. EXAM: LEFT FOOT - 2 VIEW COMPARISON:  None. FINDINGS: There is no evidence of fracture or dislocation. There is no evidence of arthropathy or other focal bone abnormality. Generalized soft tissue edema versus habitus. IMPRESSION: No fracture or dislocation of the foot. Electronically Signed   By: Narda Rutherford M.D.   On: 11/01/2020 00:04   VAS [place] ABI WITH/WO TBI  Result Date: 11/02/2020  LOWER EXTREMITY DOPPLER STUDY Patient Name:  ALWILDA GILLAND  Date of Exam:   11/01/2020 Medical Rec #: [id number]          Accession #:    [id number] Date of Birth: [birth year]         Patient Gender: F Patient Age:   15 years Exam Location:  [facility] Procedure:      VAS [place] ABI WITH/WO TBI Referring Phys: Leonette Most  FIELDS --------------------------------------------------------------------------------  Indications: S/p fall. Left ACL, PCL tears, patellar dislocation, MCL sprain              muscle tears; at risk for popliteal intimal artery tear.  Comparison Study: No prior study Performing Technologist: Gertie Fey MHA, RVT, RDCS, RDMS  Examination Guidelines: A  complete evaluation includes at minimum, Doppler waveform signals and systolic blood pressure reading at the level of bilateral brachial, anterior tibial, and posterior tibial arteries, when vessel segments are accessible. Bilateral testing is considered an integral part of a complete examination. Photoelectric Plethysmograph (PPG) waveforms and toe systolic pressure readings are included as required and additional duplex testing as needed. Limited examinations for reoccurring indications may be performed as noted.  ABI Findings: +--------+------------------+-----+---------+--------+ Right   Rt Pressure (mmHg)IndexWaveform Comment  +--------+------------------+-----+---------+--------+ [id number]                    triphasic         +--------+------------------+-----+---------+--------+ PTA     115               0.86 triphasic         +--------+------------------+-----+---------+--------+ DP      154               1.15 triphasic         +--------+------------------+-----+---------+--------+ +--------+------------------+-----+---------+----------------------------------+ Left    Lt Pressure (mmHg)IndexWaveform Comment                            +--------+------------------+-----+---------+----------------------------------+ Brachial                       triphasicUnable to obtain pressure due to                                           IV location                        +--------+------------------+-----+---------+----------------------------------+ PTA     150               1.12 triphasic                                    +--------+------------------+-----+---------+----------------------------------+ DP      132               0.99 triphasic                                   +--------+------------------+-----+---------+----------------------------------+ +-------+-----------+-----------+------------+------------+ ABI/TBIToday's ABIToday's TBIPrevious ABIPrevious TBI +-------+-----------+-----------+------------+------------+ Right  1.15                                           +-------+-----------+-----------+------------+------------+ Left   1.12                                           +-------+-----------+-----------+------------+------------+  Summary: Right: Resting right ankle-brachial index is within normal range. No evidence of significant right lower extremity arterial disease. Left: Resting left ankle-brachial index is within normal range. No evidence of significant left lower extremity arterial disease.  *See table(s) above for measurements and observations.  Electronically signed by Coral ElseVance Brabham MD on 11/02/2020 at 6:57:06 PM.    Final    DG Femur Min 2 Views Left  Result Date:  11/01/2020 CLINICAL DATA:  Trip and fall and heard a crack. Pain mostly about the knee and proximal lower leg. EXAM: LEFT FEMUR 2 VIEWS COMPARISON:  None. FINDINGS: Knee dislocation is better assessed on concurrent tibia/fibula exam. No evidence of femur fracture. Hip alignment is grossly maintained. Soft tissue edema from habitus limits detailed assessment. IMPRESSION: No fracture of the left femur. Knee dislocation better assessed on concurrent tibia/fibula exam. Electronically Signed   By: Narda Rutherford M.D.   On: 11/01/2020 00:02   VAS [place] LOWER EXTREMITY ARTERIAL DUPLEX  Result Date: 11/02/2020 LOWER EXTREMITY ARTERIAL DUPLEX STUDY Patient Name:  JAGGER BEAHM  Date of Exam:   11/01/2020 Medical Rec #: [id number]          Accession #:    [id number] Date of Birth: [birth year]         Patient Gender:  F Patient Age:   4 years Exam Location:  [facility] Procedure:      VAS [place] LOWER EXTREMITY ARTERIAL DUPLEX Referring Phys: Leonette Most FIELDS --------------------------------------------------------------------------------  Indications: S/p fall. Left ACL, PCL tears, patellar dislocation, MCL sprain              muscle tears; at risk for popliteal intimal artery tear.  Current ABI: Right=1.15, Left=1.12 Limitations: Body habitus, restricted mobility, depth of vessels. Evaluation of              left popliteal artery limited to color and pulsed wave Doppler              only; unable to evaluate using B-mode ultrasound, or linear array              transducer for better definition of intimal wall secondary to              technical limitations. Comparison Study: No prior study Performing Technologist: Gertie Fey MHA, RDMS, RVT, RDCS  Examination Guidelines: A complete evaluation includes B-mode imaging, spectral Doppler, color Doppler, and power Doppler as needed of all accessible portions of each vessel. Bilateral testing is considered an integral part of a complete examination. Limited examinations for reoccurring indications may be performed as noted.   +--------------+-------+-----------+---------+--------+-----+--------+ Left PoplitealAP (cm)Transv (cm)Waveform StenosisShapeComments +--------------+-------+-----------+---------+--------+-----+--------+ Mid                             triphasicnormal                +--------------+-------+-----------+---------+--------+-----+--------+  Summary: Left: No evidence of hemodynamically significant stenosis. No obvious evidence of dissection by color Doppler.  See table(s) above for measurements and observations. Electronically signed by Coral Else MD on 11/02/2020 at 6:56:56 PM.    Final    VAS [place] LOWER EXTREMITY VENOUS (DVT)  Result Date: 11/18/2020  Lower Venous DVT Study Patient Name:  AHYANA SKILLIN  Date of Exam:   11/18/2020  Medical Rec #: [id number]          Accession #:    [id number] Date of Birth: 1990-07-14         Patient Gender: F Patient Age:   51 years Exam Location:  [facility] Procedure:      VAS [place] LOWER EXTREMITY VENOUS (DVT) Referring Phys: STEPHEN CHIU --------------------------------------------------------------------------------  Indications: Pain.  Risk Factors: Trauma. Limitations: Body habitus and poor ultrasound/tissue interface. Comparison Study: No prior studies. Performing Technologist: Chanda Busing RVT  Examination Guidelines: A complete evaluation includes B-mode imaging, spectral Doppler, color Doppler, and power Doppler  as needed of all accessible portions of each vessel. Bilateral testing is considered an integral part of a complete examination. Limited examinations for reoccurring indications may be performed as noted. The reflux portion of the exam is performed with the patient in reverse Trendelenburg.  +-----+---------------+---------+-----------+----------+--------------+ RIGHTCompressibilityPhasicitySpontaneityPropertiesThrombus Aging +-----+---------------+---------+-----------+----------+--------------+ CFV  Full           Yes      Yes                                 +-----+---------------+---------+-----------+----------+--------------+   +---------+---------------+---------+-----------+----------+-------------------+ LEFT     CompressibilityPhasicitySpontaneityPropertiesThrombus Aging      +---------+---------------+---------+-----------+----------+-------------------+ CFV      Full           Yes      Yes                                      +---------+---------------+---------+-----------+----------+-------------------+ SFJ      Full                                                             +---------+---------------+---------+-----------+----------+-------------------+ FV Prox  Full                                                              +---------+---------------+---------+-----------+----------+-------------------+ FV Mid   Full                                                             +---------+---------------+---------+-----------+----------+-------------------+ FV Distal               Yes      Yes                                      +---------+---------------+---------+-----------+----------+-------------------+ PFV      Full                                                             +---------+---------------+---------+-----------+----------+-------------------+ POP      Full           Yes      Yes                                      +---------+---------------+---------+-----------+----------+-------------------+ PTV      Full                                                             +---------+---------------+---------+-----------+----------+-------------------+  PERO                                                  Not well visualized +---------+---------------+---------+-----------+----------+-------------------+    Summary: RIGHT: - No evidence of common femoral vein obstruction.  LEFT: - There is no evidence of deep vein thrombosis in the lower extremity. However, portions of this examination were limited- see technologist comments above.  - No cystic structure found in the popliteal fossa.  *See table(s) above for measurements and observations. Electronically signed by Sherald Hess MD on 11/18/2020 at 2:43:02 PM.    Final    US Abdomen Limited RUQ (LIVER/GB)  Result Date: 11/20/2020 CLINICAL DATA:  Cholelithiasis. EXAM: ULTRASOUND ABDOMEN LIMITED RIGHT UPPER QUADRANT COMPARISON:  None. FINDINGS: Gallbladder: No gallstones or wall thickening visualized (2.5 mm). No sonographic Murphy sign noted by sonographer. Common bile duct: Diameter: 5.1 mm Liver: No focal lesion identified. Within normal limits in parenchymal echogenicity. Portal vein is patent on color Doppler imaging with  normal direction of blood flow towards the liver. Other: None. IMPRESSION: Normal right upper quadrant ultrasound. Electronically Signed   By: Aram Candela M.D.   On: 11/20/2020 00:54   DG ESOPHAGUS W SINGLE CM (SOL OR THIN BA)  Result Date: 11/18/2020 CLINICAL DATA:  Emesis with attempted taking of pills. Nausea. Gagging. EXAM: ESOPHOGRAM/BARIUM SWALLOW TECHNIQUE: Single contrast examination was performed using  thin barium. FLUOROSCOPY TIME:  Fluoroscopy Time:   minute and 0 seconds Radiation Exposure Index (if provided by the fluoroscopic device): 23.4 mGy Number of Acquired Spot Images: 0 COMPARISON:  11/03/2020 CTA chest FINDINGS: An attempted focused single-contrast exam was initiated. The patient was placed in LPO position, as secondary to immobility and morbid obesity, she could not stand upright or be placed in RAO position. With attempted single swallow, no gross narrowing or stricture is identified. Prior to contrast traversing the distal esophagus, the patient exhibited emesis. A second attempt was performed, with immediate emesis as well. IMPRESSION: Extremely limited exam, secondary to episodes of emesis with individual swallows. No gross esophageal stricture identified to the level of the lower thoracic esophagus. Electronically Signed   By: Jeronimo Greaves M.D.   On: 11/18/2020 12:12     Subjective: Patient was seen and examined at bedside.  Overnight events noted.  Patient reports feeling much improved,  Nausea and vomiting has resolved.   Patient reports pain is manageable,  transport is arranged and patient is being discharged home. Home health services been arranged.  Discharge Exam: Vitals:   11/27/20 1954 11/28/20 0643  BP: 118/73 (!) 109/57  Pulse: 81 91  Resp: 20 20  Temp: 98.4 F (36.9 C) 98.2 F (36.8 C)  SpO2: 98% 98%   Vitals:   11/27/20 0432 11/27/20 1325 11/27/20 1954 11/28/20 0643  BP: 101/63 (!) 97/55 118/73 (!) 109/57  Pulse: 95 88 81 91  Resp: Temp: 98.1 F (36.7 C) 98.6 F (37 C) 98.4 F (36.9 C) 98.2 F (36.8 C)  TempSrc: Oral Oral Oral Oral  SpO2: 97% 99% 98% 98%  Weight:      Height:        General: Pt is alert, awake, not in acute distress Cardiovascular: RRR, S1/S2 +, no rubs, no gallops Respiratory: CTA bilaterally, no wheezing, no rhonchi Abdominal: Soft, NT, ND, bowel sounds + Extremities:  Left knee in brace, tenderness noted.    The results of significant diagnostics from this hospitalization (including imaging, microbiology, ancillary and laboratory) are listed below for reference.     Microbiology: No results found for this or any previous visit (from the past 240 hour(s)).   Labs: BNP (last 3 results) No results for input(s): BNP in the last 8760 hours. Basic Metabolic Panel: Recent Labs  Lab 11/26/20 0407  CREATININE 0.46   Liver Function Tests: No results for input(s): AST, ALT, ALKPHOS, BILITOT, PROT, ALBUMIN in the last 168 hours. No results for input(s): LIPASE, AMYLASE in the last 168 hours. No results for input(s): AMMONIA in the last 168 hours. CBC: Recent Labs  Lab 11/26/20 0407  WBC 4.8  HGB 8.2*  HCT 27.2*  MCV 94.1  PLT 348   Cardiac Enzymes: No results for input(s): CKTOTAL, CKMB, CKMBINDEX, TROPONINI in the last 168 hours. BNP: Invalid input(s): POCBNP CBG: No results for input(s): GLUCAP in the last 168 hours. D-Dimer No results for input(s): DDIMER in the last 72 hours. Hgb A1c No results for input(s): HGBA1C in the last 72 hours. Lipid Profile No results for input(s): CHOL, HDL, LDLCALC, TRIG, CHOLHDL, LDLDIRECT in the last 72 hours. Thyroid function studies No results for input(s): TSH, T4TOTAL, T3FREE, THYROIDAB in the last 72 hours.  Invalid input(s): FREET3 Anemia work up No results for input(s): VITAMINB12, FOLATE, FERRITIN, TIBC, IRON, RETICCTPCT in the last 72 hours. Urinalysis    Component Value Date/Time   COLORURINE YELLOW 11/10/2020 1647    APPEARANCEUR TURBID (A) 11/10/2020 1647   LABSPEC 1.020 11/10/2020 1647   PHURINE 8.0 11/10/2020 1647   GLUCOSEU NEGATIVE 11/10/2020 1647   HGBUR NEGATIVE 11/10/2020 1647   BILIRUBINUR NEGATIVE 11/10/2020 1647   KETONESUR NEGATIVE 11/10/2020 1647   PROTEINUR 100 (A) 11/10/2020 1647   UROBILINOGEN 1.0 10/27/2013 2116   NITRITE POSITIVE (A) 11/10/2020 1647   LEUKOCYTESUR LARGE (A) 11/10/2020 1647   Sepsis Labs Invalid input(s): PROCALCITONIN,  WBC,  LACTICIDVEN Microbiology No results found for this or any previous visit (from the past 240 hour(s)).   Time coordinating discharge: Over 30 minutes  SIGNED:   Cipriano Bunker, MD  [facility] 11/28/2020, 12:26 PM Pager   If 7PM-7AM, please contact night-coverage

## 2020-11-28 NOTE — Progress Notes (Signed)
Patient ID: Carla Little, female   DOB: Aug 28, 1990, 30 y.o.   MRN: [id number] I spoke to the patient about her left knee at the bedside this morning.  She is hoping to go home once her DME and hospital bed are delivered.  She can continue to weight-bear as tolerated with limited flexion of her left knee.  She will adjust the brace as it loosens.  She is now 4 weeks out from her injury and this should continue to stiffen on her and continue to become more stable in the long run without any type of surgical intervention.  I told her that I can see her in the office in 2 weeks.

## 2020-11-28 NOTE — Progress Notes (Signed)
Patient discharged via stretcher. Patient left in stable condition and took her belongings with her. Patient uncle came to pick up her bedside commode and walker to take to her home. Went over AVS discharge instructions with patient and patient didn't have any questions. Patient verbalized understanding.

## 2020-11-28 NOTE — Discharge Instructions (Signed)
Follow up PCP in one week. Advised to follow up Orthopaedics in 2 weeks. Advised to take oxycodone as needed for pain. Advised to take keflex twice daily for five days.

## 2020-11-28 NOTE — Progress Notes (Signed)
Physical Therapy Treatment Patient Details Name: Carla Little MRN: [id number] DOB: 10/08/1990 Today's Date: 11/28/2020    History of Present Illness Carla Little is an 30 y.o.  who is morbidly obese, history of depression and got up to go the bathroom and felt a pop in her knee and had sudden instability and was unable to get up, EMS transported to the ER and found to have a knee dislocation which was reduced with fractures of the proximal fibula ligamentous avulsion laterally as well as medially off the medial femoral condyle and MRI showed complete ACL tear, PCL tear and MCL strain and has a large effusion and is going to be in knee immobilizer.  Vascular ultrasounds done and nondiagnostic. As of 8/13 she was placed in a L Bledsoe brace with WBAT orders and no flexion of knee.    PT Comments    Patient improving in mobility,.Safety and endurance. Patient knows to reposition left KI  frequently when it slides do wn.  Patient practiced 1 step, ambulated 10', 40', 50' using RW.  Patient will need bariatric RW and  3in 1, A WC could benefit but understand that RW is most imperative.  Patient reports a family member coming to stay with her for a few days starting tomorrow evening.  Follow Up Recommendations  Home health PT     Equipment Recommendations  Rolling walker with 5" wheels;3in1 (PT);Wheelchair (measurements PT);Wheelchair cushion (measurements PT)    Recommendations for Other Services       Precautions / Restrictions Precautions Precautions: Fall Precaution Comments: NO FLEXION LEFT KNEE, using KI on  left t now, Required Braces or Orthoses: Knee Immobilizer - Left Knee Immobilizer - Left: On at all times Other Brace: ok to loosen Left brace when resting. Per Dr. Magnus Ivan, OK to use  KI vs [facility] . Have modified the KI and added 2 metal stays across the anterior knee(from another  KI.) Restrictions LLE Weight Bearing: Weight bearing as tolerated    Mobility  Bed  Mobility               General bed mobility comments: in recliner    Transfers   Equipment used: Rolling walker (2 wheeled) Transfers: Sit to/from Stand           General transfer comment: from recliner, pushes up on RW without LOB, aware of the need to push down vs safety risk of pulling on RW. repeated sit<>stand trials from recliner, no phsyical assist needed  Ambulation/Gait Ambulation/Gait assistance: Supervision;Min guard Gait Distance (Feet): 10 Feet (then 40 then 50') Assistive device: Rolling walker (2 wheeled) Gait Pattern/deviations: Step-through pattern Gait velocity: decr   General Gait Details: KI slides caudally a few inches with gait. chair follow for safety, min/guard to close supervision. brief seatesd rest between distances above   Stairs Stairs: Yes Stairs assistance: Min guard Stair Management: No rails;Forwards;With walker Number of Stairs: 1 General stair comments: cues for sequence   Wheelchair Mobility    Modified Rankin (Stroke Patients Only)       Balance Overall balance assessment: Needs assistance Sitting-balance support: No upper extremity supported Sitting balance-Leahy Scale: Good     Standing balance support: During functional activity   Standing balance comment: able to static stand without UE support                            Cognition Arousal/Alertness: Awake/alert Behavior During Therapy: WFL for tasks assessed/performed Overall  Cognitive Status: Within Functional Limits for tasks assessed                                 General Comments: patient excited about DC home but may be delayed until tomorrow which patient is a little down about. Patient's cousin  coming in Tuesday  to stay for a few days.      Exercises      General Comments        Pertinent Vitals/Pain Faces Pain Scale: Hurts a little bit Pain Location: L knee Pain Descriptors / Indicators: Discomfort Pain  Intervention(s): Premedicated before session    Home Living                      Prior Function            PT Goals (current goals can now be found in the care plan section) Progress towards PT goals: Progressing toward goals    Frequency    Min 5X/week      PT Plan Current plan remains appropriate;Other (comment);Frequency needs to be updated    Co-evaluation              AM-PAC PT "6 Clicks" Mobility   Outcome Measure  Help needed turning from your back to your side while in a flat bed without using bedrails?: None Help needed moving from lying on your back to sitting on the side of a flat bed without using bedrails?: None Help needed moving to and from a bed to a chair (including a wheelchair)?: None Help needed standing up from a chair using your arms (e.g., wheelchair or bedside chair)?: None Help needed to walk in hospital room?: A Little Help needed climbing 3-5 steps with a railing? : A Little 6 Click Score: 22    End of Session Equipment Utilized During Treatment: Gait belt;Left knee immobilizer Activity Tolerance: Patient tolerated treatment well Patient left: in chair;with call bell/phone within reach Nurse Communication: Mobility status PT Visit Diagnosis: Unsteadiness on feet (R26.81);History of falling (Z91.81);Pain Pain - Right/Left: Left Pain - part of body: Knee     Time: 1050-1130 PT Time Calculation (min) (ACUTE ONLY): 40 min  Charges:  $Gait Training: 23-37 mins $Self Care/Home Management: 8-22                     Blanchard Kelch PT Acute Rehabilitation Services Pager [phone] Office [phone]    Rada Hay 11/28/2020, 1:30 PM

## 2020-11-28 NOTE — TOC Progression Note (Signed)
Transition of Care [facility]) - Progression Note    Patient Details  Name: Carla Little MRN: [id number] Date of Birth: 1990-10-26  [facility] CM/SW Contact  Geni Bers, RN Phone Number: 11/28/2020, 12:56 PM  Clinical Narrative:    Sharin Mons was called, RN and Pt are aware.    Expected Discharge Plan: Skilled Nursing Facility Barriers to Discharge: Continued Medical Work up, Inadequate or no insurance, SNF Pending bed offer  Expected Discharge Plan and Services Expected Discharge Plan: Skilled Nursing Facility In-house Referral: Clinical Social Work, Chief Executive Officer Acute Care Choice: Skilled Nursing Facility Living arrangements for the past 2 months: Apartment Expected Discharge Date: 11/28/20               DME Arranged: N/A DME Agency: NA                   Social Determinants of Health (SDOH) Interventions    Readmission Risk Interventions No flowsheet data found.

## 2020-11-29 ENCOUNTER — Emergency Department (HOSPITAL_COMMUNITY)
Admission: EM | Admit: 2020-11-29 | Discharge: 2020-11-30 | Disposition: A | Discharge status: Home / Self Care | Payer: Medicaid Other | Attending: Emergency Medicine | Admitting: Emergency Medicine

## 2020-11-29 ENCOUNTER — Other Ambulatory Visit: Payer: Self-pay

## 2020-11-29 DIAGNOSIS — Z79899 Other long term (current) drug therapy: Secondary | ICD-10-CM | POA: Insufficient documentation

## 2020-11-29 DIAGNOSIS — M25562 Pain in left knee: Secondary | ICD-10-CM

## 2020-11-29 DIAGNOSIS — K219 Gastro-esophageal reflux disease without esophagitis: Secondary | ICD-10-CM | POA: Insufficient documentation

## 2020-11-29 DIAGNOSIS — R000 Tachycardia, unspecified: Secondary | ICD-10-CM | POA: Diagnosis not present

## 2020-11-29 DIAGNOSIS — R1084 Generalized abdominal pain: Secondary | ICD-10-CM | POA: Diagnosis not present

## 2020-11-29 DIAGNOSIS — X58XXXA Exposure to other specified factors, initial encounter: Secondary | ICD-10-CM | POA: Diagnosis not present

## 2020-11-29 DIAGNOSIS — S8992XA Unspecified injury of left lower leg, initial encounter: Secondary | ICD-10-CM | POA: Diagnosis present

## 2020-11-29 DIAGNOSIS — R112 Nausea with vomiting, unspecified: Secondary | ICD-10-CM

## 2020-11-29 LAB — COMPREHENSIVE METABOLIC PANEL
ALT: 34 U/L (ref 0–44)
AST: 89 U/L — ABNORMAL HIGH (ref 15–41)
Albumin: 2.8 g/dL — ABNORMAL LOW (ref 3.5–5.0)
Alkaline Phosphatase: 59 U/L (ref 38–126)
Anion gap: 6 (ref 5–15)
BUN: <5 mg/dL — ABNORMAL LOW (ref 6–20)
CO2: 31 mmol/L (ref 22–32)
Calcium: 8.8 mg/dL — ABNORMAL LOW (ref 8.9–10.3)
Chloride: 107 mmol/L (ref 98–111)
Creatinine, Ser: 0.56 mg/dL (ref 0.44–1.00)
GFR, Estimated: >60 mL/min (ref >60)
Glucose, Bld: 100 mg/dL — ABNORMAL HIGH (ref 70–99)
Potassium: 2.9 mmol/L — ABNORMAL LOW (ref 3.5–5.1)
Sodium: 144 mmol/L (ref 135–145)
Total Bilirubin: 1.1 mg/dL (ref 0.3–1.2)
Total Protein: 6.6 g/dL (ref 6.5–8.1)

## 2020-11-29 LAB — CBC WITH DIFFERENTIAL/PLATELET
Abs Immature Granulocytes: 0.02 10*3/uL (ref 0.00–0.07)
Basophils Absolute: 0 10*3/uL (ref 0.0–0.1)
Basophils Relative: 0 %
Eosinophils Absolute: 0 10*3/uL (ref 0.0–0.5)
Eosinophils Relative: 0 %
HCT: 28.5 % — ABNORMAL LOW (ref 36.0–46.0)
Hemoglobin: 8.6 g/dL — ABNORMAL LOW (ref 12.0–15.0)
Immature Granulocytes: 1 %
Lymphocytes Relative: 41 %
Lymphs Abs: 1.7 10*3/uL (ref 0.7–4.0)
MCH: 28.5 pg (ref 26.0–34.0)
MCHC: 30.2 g/dL (ref 30.0–36.0)
MCV: 94.4 fL (ref 80.0–100.0)
Monocytes Absolute: 0.5 10*3/uL (ref 0.1–1.0)
Monocytes Relative: 11 %
Neutro Abs: 2 10*3/uL (ref 1.7–7.7)
Neutrophils Relative %: 47 %
Platelets: 250 10*3/uL (ref 150–400)
RBC: 3.02 MIL/uL — ABNORMAL LOW (ref 3.87–5.11)
RDW: 30.7 % — ABNORMAL HIGH (ref 11.5–15.5)
WBC: 4.3 10*3/uL (ref 4.0–10.5)
nRBC: 0 % (ref 0.0–0.2)

## 2020-11-29 MED ORDER — POTASSIUM CHLORIDE 10 MEQ/100ML IV SOLN
10.0000 meq | INTRAVENOUS | Status: AC
  Administered 2020-11-29 (×3): 10 meq via INTRAVENOUS
  Filled 2020-11-29 (×3): qty 100

## 2020-11-29 MED ORDER — HYDROMORPHONE HCL 1 MG/ML IJ SOLN
1.0000 mg | Freq: Once | INTRAMUSCULAR | Status: AC
  Administered 2020-11-29: 1 mg via INTRAVENOUS
  Filled 2020-11-29: qty 1

## 2020-11-29 MED ORDER — OXYCODONE HCL 5 MG PO TABS: 5.0000 mg | ORAL_TABLET | ORAL | 0 refills | Status: DC | PRN

## 2020-11-29 MED ORDER — PROMETHAZINE HCL 25 MG RE SUPP: 25.0000 mg | Freq: Four times a day (QID) | RECTAL | 0 refills | Status: DC | PRN

## 2020-11-29 MED ORDER — ONDANSETRON 4 MG PO TBDP: 4.0000 mg | ORAL_TABLET | Freq: Three times a day (TID) | ORAL | 0 refills | Status: DC | PRN

## 2020-11-29 MED ORDER — ONDANSETRON HCL 4 MG/2ML IJ SOLN
4.0000 mg | Freq: Once | INTRAMUSCULAR | Status: AC
  Administered 2020-11-29: 4 mg via INTRAVENOUS
  Filled 2020-11-29: qty 2

## 2020-11-29 NOTE — ED Provider Notes (Signed)
Carla Little Provider Note   CSN: [id number] Arrival date & time: 11/29/20  1093     History Chief Complaint  Patient presents with   Nausea    Carla Little is a 30 y.o. female.  Patient to ED after hospital discharge yesterday with persistent nausea, vomiting, generalized abdominal pain and left knee pain. No fever. She was hospitalized 8/8 for left knee injury that included dislocation, ACL, MCL rupture and PCL strain, managed conservatively by orthopedics. While hospitalized she developed nausea and vomiting, underwent EGD which showed esopohagitis as well as a small, nonbleeding gastric ulcer. H.Pylori pending. She experienced significant constipation during her stay which was relieved with aggressive laxatives. She continues to have multiple loose stools daily, often associated with incontinence. She was discharged to home yesterday stating family was planning to arrive today to help in her care but returns to the ED for persistent nausea/vomiting after discharge. She states she feels she was discharged too early.   The history is provided by the patient. No language interpreter was used.      Past Medical History:  Diagnosis Date   Depression    GERD (gastroesophageal reflux disease)    Obesity     Patient Active Problem List   Diagnosis Date Noted   Pressure injury of skin 11/21/2020   Left knee dislocation 11/01/2020   Knee dislocation, left, initial encounter 11/01/2020    Past Surgical History:  Procedure Laterality Date   BIOPSY  11/19/2020   Procedure: BIOPSY;  Surgeon: Charna Elizabeth, MD;  Location: [facility] ENDOSCOPY;  Service: Endoscopy;;   ESOPHAGOGASTRODUODENOSCOPY (EGD) WITH PROPOFOL N/A 11/19/2020   Procedure: ESOPHAGOGASTRODUODENOSCOPY (EGD) WITH PROPOFOL;  Surgeon: Charna Elizabeth, MD;  Location: [facility] ENDOSCOPY;  Service: Endoscopy;  Laterality: N/A;     OB History   No obstetric history on file.     No family history on  file.  Social History   Tobacco Use   Smoking status: Never   Smokeless tobacco: Never  Substance Use Topics   Alcohol use: Yes   Drug use: No    Home Medications Prior to Admission medications   Medication Sig Start Date End Date Taking? Authorizing Provider  cephALEXin (KEFLEX) 500 MG capsule Take 1 capsule (500 mg total) by mouth 2 (two) times daily for 5 days. 11/28/20 12/03/20  Cipriano Bunker, MD  cholecalciferol (VITAMIN D) 25 MCG tablet Take 2 tablets (2,000 Units total) by mouth daily. 11/28/20   Cipriano Bunker, MD  ibuprofen (ADVIL,MOTRIN) 200 MG tablet Take 400 mg by mouth 2 (two) times daily as needed for headache, moderate pain or cramping.     [provider]  methocarbamol (ROBAXIN) 500 MG tablet Take 1 tablet (500 mg total) by mouth every 6 (six) hours as needed for muscle spasms. 11/28/20   Cipriano Bunker, MD  metoprolol tartrate (LOPRESSOR) 25 MG tablet Take 0.5 tablets (12.5 mg total) by mouth 2 (two) times daily. 11/28/20   Cipriano Bunker, MD  ondansetron (ZOFRAN) 4 MG tablet Take 1 tablet (4 mg total) by mouth every 6 (six) hours as needed for nausea. 11/28/20   Cipriano Bunker, MD  oxyCODONE (OXY IR/ROXICODONE) 5 MG immediate release tablet Take 1 tablet (5 mg total) by mouth every 4 (four) hours as needed for moderate pain (pain score 4-6). 11/28/20   Cipriano Bunker, MD    Allergies    Azithromycin  Review of Systems   Review of Systems  Constitutional:  Negative for chills and fever.  HENT:  Negative.    Respiratory: Negative.  Negative for shortness of breath.   Cardiovascular: Negative.  Negative for chest pain.  Gastrointestinal:  Positive for abdominal pain, diarrhea, nausea and vomiting. Negative for blood in stool.  Genitourinary:  Negative for decreased urine volume and dysuria.  Musculoskeletal:        Left knee pain, uncontrolled at home.  Skin:  Positive for wound (Reports decubitus ulcer (no worse)). Negative for color change.  Neurological:  Positive  for light-headedness. Negative for syncope and weakness.   Physical Exam Updated Vital Signs BP 113/74 (BP Location: Right Wrist)   Pulse (!) 114   Temp 98.7 F (37.1 C) (Oral)   Resp 19   LMP 11/12/2020 (Exact Date)   SpO2 99%   Physical Exam Vitals and nursing note reviewed.  Constitutional:      General: She is not in acute distress.    Appearance: Normal appearance. She is well-developed. She is obese.  HENT:     Head: Normocephalic.     Mouth/Throat:     Mouth: Mucous membranes are moist.  Eyes:     Conjunctiva/sclera: Conjunctivae normal.  Cardiovascular:     Rate and Rhythm: Regular rhythm. Tachycardia present.     Pulses: Normal pulses.     Heart sounds: No murmur heard. Pulmonary:     Effort: Pulmonary effort is normal.     Breath sounds: Normal breath sounds. No wheezing, rhonchi or rales.  Abdominal:     Palpations: Abdomen is soft.     Tenderness: There is abdominal tenderness (TTP upper>lower abdomen). There is no guarding or rebound.  Musculoskeletal:        General: Normal range of motion.     Cervical back: Normal range of motion and neck supple.     Comments: Left knee is not discolored, generally tender, no wound. Joint exam limited by body habitus.   Skin:    General: Skin is warm and dry.  Neurological:     General: No focal deficit present.     Mental Status: She is alert and oriented to person, place, and time.     Sensory: No sensory deficit.    ED Results / Procedures / Treatments   Labs (all labs ordered are listed, but only abnormal results are displayed) Labs Reviewed - No data to display  EKG None  Radiology No results found.  Procedures Procedures   Medications Ordered in ED Medications - No data to display  ED Course  I have reviewed the triage vital signs and the nursing notes.  Pertinent labs & imaging results that were available during my care of the patient were reviewed by me and considered in my medical decision  making (see chart for details).    MDM Rules/Calculators/A&P                           Patient returns to the hospital after being discharged yesterday with symptoms as outlined in the HPI.   She is overall well appearing. The left knee does not appear red or warm. Distal pulses intact. Exam otherwise is unremarkable. She appears well hydrated.   No vomiting during ED encounter. No diarrheal stools. She is given ODT Zofran and reports her nausea is better. She is provided something for pain and feels some relief.   Labs are reassuring. Mild hypokalemia that is supplemented in the ED. I do not find any criteria for re-admission at this time. TOC consultation  requested to insure contact with family to verify in-home help. Discussed wheelchair, but the patient feels with being able to take her pain medication she can ambulate adequately with her walker while using her knee brace. In-home PT evaluation orderd.   There is family at bedside who can assist with care. Patient verified other family members will be arriving tomorrow.   She is felt appropriate for discharge home. Rx ODT zofran provided. The patient is comfortable with plan of discharge.   Final Clinical Impression(s) / ED Diagnoses Final diagnoses:  None   N, V Left knee injury  Rx / DC Orders ED Discharge Orders     None        Elpidio Anis, PA-C 12/02/20 0636    Gloris Manchester, MD 12/04/20 [phone]

## 2020-11-29 NOTE — Discharge Instructions (Addendum)
Follow up with orthopedics as recommended for further management of knee injury. Use your walker to get around, and have the knee brace on when walking.   Use Zofran ODT to help with nausea so you can take your pain medications. If nausea is not controlled with this, try the phenergan suppositories as an alternative.   Return to the ED with any new or worsening symptoms.

## 2020-11-29 NOTE — ED Triage Notes (Signed)
Patient is A&O x4 she was here for a month and came back this morning. Due to nausea and vomiting.

## 2020-11-29 NOTE — Progress Notes (Signed)
..  Transition of Care [facility]) - Emergency Department Mini Assessment   Patient Details  Name: Carla Little MRN: [id number] Date of Birth: 1990-12-19  Transition of Care [facility]) CM/SW Contact:    Larrie Kass, LCSW Phone Number: 11/29/2020, 3:04 PM   Clinical Narrative:  Spoke with pt , she stated she came to ED due to her not able to keep medications down. Pt was d/c yesterday after being in hospital for 1 month. Pt already has DME set up in her home, CSW has confirmed [facility] accepted pt for [facility] PT.   Per pt she has family coming tomorrow from up [place] to help her in the home. Pt stated she recently purchased a new bed, and now is having trouble getting in and out of her bed. CSW suggested pt get a step stool to help with getting up and out of bed. Pt stated she will need a ride home and requested an ambulance transfer.    ED Mini Assessment: What brought you to the Emergency Department? : vomiting  Barriers to Discharge: No Barriers Identified     Means of departure: Ambulance       Patient Contact and Communications        ,                 Admission diagnosis:  nausea;emesis;knee pain Patient Active Problem List   Diagnosis Date Noted   Pressure injury of skin 11/21/2020   Left knee dislocation 11/01/2020   Knee dislocation, left, initial encounter 11/01/2020   PCP:  Patient, No Pcp Per (Inactive) Pharmacy:   [id number] [place], [place] - [address] [place] [address] Phone: [phone] Fax: [phone]

## 2020-11-30 DIAGNOSIS — R112 Nausea with vomiting, unspecified: Secondary | ICD-10-CM | POA: Diagnosis not present

## 2020-11-30 DIAGNOSIS — K219 Gastro-esophageal reflux disease without esophagitis: Secondary | ICD-10-CM | POA: Diagnosis not present

## 2020-11-30 DIAGNOSIS — R000 Tachycardia, unspecified: Secondary | ICD-10-CM | POA: Diagnosis not present

## 2020-11-30 DIAGNOSIS — Z79899 Other long term (current) drug therapy: Secondary | ICD-10-CM | POA: Diagnosis not present

## 2020-11-30 DIAGNOSIS — X58XXXA Exposure to other specified factors, initial encounter: Secondary | ICD-10-CM | POA: Diagnosis not present

## 2020-11-30 DIAGNOSIS — S8992XA Unspecified injury of left lower leg, initial encounter: Secondary | ICD-10-CM | POA: Diagnosis present

## 2020-11-30 DIAGNOSIS — R1084 Generalized abdominal pain: Secondary | ICD-10-CM | POA: Diagnosis not present

## 2020-11-30 LAB — URINALYSIS, ROUTINE W REFLEX MICROSCOPIC
Glucose, UA: NEGATIVE mg/dL
Hgb urine dipstick: NEGATIVE
Ketones, ur: 15 mg/dL — AB
Leukocytes,Ua: NEGATIVE
Nitrite: NEGATIVE
Protein, ur: NEGATIVE mg/dL
Specific Gravity, Urine: 1.02 (ref 1.005–1.030)
pH: 7 (ref 5.0–8.0)

## 2020-11-30 MED ORDER — PROCHLORPERAZINE MALEATE 10 MG PO TABS
10.0000 mg | ORAL_TABLET | Freq: Once | ORAL | Status: DC
  Filled 2020-11-30: qty 1

## 2020-11-30 MED ORDER — ACETAMINOPHEN 500 MG PO TABS
1000.0000 mg | ORAL_TABLET | Freq: Once | ORAL | Status: DC
  Filled 2020-11-30: qty 2

## 2020-11-30 MED ORDER — OXYCODONE HCL 5 MG PO TABS
5.0000 mg | ORAL_TABLET | Freq: Once | ORAL | Status: DC
Start: 2020-11-30 — End: 2020-11-30
  Filled 2020-11-30: qty 1

## 2020-11-30 MED ORDER — PANTOPRAZOLE SODIUM 40 MG IV SOLR
40.0000 mg | Freq: Once | INTRAVENOUS | Status: AC
  Administered 2020-11-30: 40 mg via INTRAVENOUS
  Filled 2020-11-30: qty 40

## 2020-11-30 MED ORDER — ONDANSETRON 4 MG PO TBDP
4.0000 mg | ORAL_TABLET | Freq: Once | ORAL | Status: AC
  Administered 2020-11-30: 4 mg via ORAL
  Filled 2020-11-30: qty 1

## 2020-11-30 MED ORDER — PROCHLORPERAZINE 25 MG RE SUPP
25.0000 mg | Freq: Once | RECTAL | Status: AC
  Administered 2020-11-30: 25 mg via RECTAL
  Filled 2020-11-30: qty 1

## 2020-11-30 MED ORDER — PROCHLORPERAZINE EDISYLATE 10 MG/2ML IJ SOLN
10.0000 mg | Freq: Once | INTRAMUSCULAR | Status: AC
  Administered 2020-11-30: 10 mg via INTRAVENOUS
  Filled 2020-11-30: qty 2

## 2020-11-30 MED ORDER — LIDOCAINE VISCOUS HCL 2 % MT SOLN
15.0000 mL | Freq: Once | OROMUCOSAL | Status: AC
  Administered 2020-11-30: 15 mL via ORAL
  Filled 2020-11-30: qty 15

## 2020-11-30 MED ORDER — CEPHALEXIN 500 MG PO CAPS
500.0000 mg | ORAL_CAPSULE | Freq: Once | ORAL | Status: DC
  Filled 2020-11-30: qty 1

## 2020-11-30 MED ORDER — ALUM & MAG HYDROXIDE-SIMETH 200-200-20 MG/5ML PO SUSP
30.0000 mL | Freq: Once | ORAL | Status: AC
  Administered 2020-11-30: 30 mL via ORAL
  Filled 2020-11-30: qty 30

## 2020-11-30 NOTE — Progress Notes (Signed)
CSW spoke with pt , pt reported she will have a family friend to pick her up upon d/c. Pt also stated her cousin is coming to town to help pt for a week. Pt confirmed contact with [facility] rep Jorja Loa and is expected to start services this weekend. TOC CSW sign off.   Valentina Shaggy.Duilio Heritage, MSW, LCSWA [facility]  Transitions of Care Clinical Social Worker I Direct Dial: [phone]  Fax: [phone] [email or web address]

## 2020-11-30 NOTE — ED Provider Notes (Signed)
Patient waiting for PTAR. Having N/V ODT zofran not helping.  PTAR declined because she can walk. Still having N/V IVF and compazine given. Gi cocktail ordered. Has tolerated GI cocktail and po water.  UA pending. Will give home oral meds. If she tolerates, she can go home. If not admit for intractable vomiting.   Nira Conn, MD 11/30/20 [phone]

## 2020-11-30 NOTE — ED Provider Notes (Signed)
7:15 AM-checkout to evaluate patient for possible discharge after Compazine suppository.  She has had ongoing vomiting for three weeks, while hospitalized for treatment of the injury.   9:10 AM-persistent vomiting despite treatment, tachycardia present.  Will consider hospitalization. Brief history: Admitted to the hospital 10/31/2020 for a knee dislocation which was managed without surgery.  She had a prolonged hospital course and was discharged on 11/28/2020.  She presented back to the ED within 24 hours of discharge.  During hospitalization she was diagnosed with intractable nausea and vomiting.  This was apparently improved, at the time of discharge.  Unfortunately it resumed almost immediately.  She was evaluated by gastroenterology; there felt that she had nonspecific etiology causing the vomiting.  She was treated with Reglan and Protonix.  During the hospitalization, she developed a urinary tract infection and was treated with ceftriaxone.  9:35 AM-patient is alert and Not currently vomiting.  When a discussion about the findings during hospitalization currently.  She has been only tachycardic now which has been persistent, and does not have current necessity for hospitalization.  However she may worsen, if she has persistent vomiting.  Note that she was hypokalemic, has been supplemented here.  Patient states she has a cousin coming to [place] to help her will arrive by train, at 8 PM tonight.  Patient feels like that will help her, because she was feeling overwhelmed when she was discharged from the hospital.  She currently lives alone.  I broached the subject of discharge with symptomatic care and follow-up and she states she will think about it.  I will have one of the hospital management team's leaders speak to the patient.  11:15 AM-she now states that she is ready to go home.   Mancel Bale, MD 11/30/20 1115

## 2020-12-09 ENCOUNTER — Encounter (HOSPITAL_COMMUNITY): Payer: Self-pay

## 2020-12-09 ENCOUNTER — Emergency Department (HOSPITAL_COMMUNITY): Payer: Medicaid Other

## 2020-12-09 ENCOUNTER — Ambulatory Visit: Payer: Self-pay | Admitting: Nurse Practitioner

## 2020-12-09 ENCOUNTER — Inpatient Hospital Stay (HOSPITAL_COMMUNITY)
Admission: EM | Admit: 2020-12-09 | Discharge: 2021-01-26 | DRG: 095 | Disposition: A | Discharge status: Skilled Nursing Facility | Payer: Medicaid Other | Attending: Internal Medicine | Admitting: Internal Medicine

## 2020-12-09 DIAGNOSIS — N920 Excessive and frequent menstruation with regular cycle: Secondary | ICD-10-CM | POA: Diagnosis present

## 2020-12-09 DIAGNOSIS — K2100 Gastro-esophageal reflux disease with esophagitis, without bleeding: Secondary | ICD-10-CM | POA: Diagnosis present

## 2020-12-09 DIAGNOSIS — F32A Depression, unspecified: Secondary | ICD-10-CM | POA: Insufficient documentation

## 2020-12-09 DIAGNOSIS — Z79899 Other long term (current) drug therapy: Secondary | ICD-10-CM

## 2020-12-09 DIAGNOSIS — E46 Unspecified protein-calorie malnutrition: Secondary | ICD-10-CM | POA: Diagnosis present

## 2020-12-09 DIAGNOSIS — B964 Proteus (mirabilis) (morganii) as the cause of diseases classified elsewhere: Secondary | ICD-10-CM | POA: Diagnosis present

## 2020-12-09 DIAGNOSIS — S83105D Unspecified dislocation of left knee, subsequent encounter: Secondary | ICD-10-CM

## 2020-12-09 DIAGNOSIS — I517 Cardiomegaly: Secondary | ICD-10-CM | POA: Diagnosis present

## 2020-12-09 DIAGNOSIS — T8092XA Unspecified transfusion reaction, initial encounter: Secondary | ICD-10-CM | POA: Diagnosis not present

## 2020-12-09 DIAGNOSIS — R509 Fever, unspecified: Secondary | ICD-10-CM

## 2020-12-09 DIAGNOSIS — L89312 Pressure ulcer of right buttock, stage 2: Secondary | ICD-10-CM | POA: Diagnosis present

## 2020-12-09 DIAGNOSIS — K7581 Nonalcoholic steatohepatitis (NASH): Secondary | ICD-10-CM | POA: Diagnosis present

## 2020-12-09 DIAGNOSIS — K828 Other specified diseases of gallbladder: Secondary | ICD-10-CM | POA: Diagnosis present

## 2020-12-09 DIAGNOSIS — S83105A Unspecified dislocation of left knee, initial encounter: Secondary | ICD-10-CM | POA: Diagnosis present

## 2020-12-09 DIAGNOSIS — K648 Other hemorrhoids: Secondary | ICD-10-CM | POA: Diagnosis present

## 2020-12-09 DIAGNOSIS — M545 Low back pain, unspecified: Secondary | ICD-10-CM | POA: Diagnosis present

## 2020-12-09 DIAGNOSIS — R1310 Dysphagia, unspecified: Secondary | ICD-10-CM | POA: Diagnosis present

## 2020-12-09 DIAGNOSIS — E538 Deficiency of other specified B group vitamins: Secondary | ICD-10-CM | POA: Diagnosis present

## 2020-12-09 DIAGNOSIS — R601 Generalized edema: Secondary | ICD-10-CM | POA: Diagnosis not present

## 2020-12-09 DIAGNOSIS — E039 Hypothyroidism, unspecified: Secondary | ICD-10-CM | POA: Diagnosis present

## 2020-12-09 DIAGNOSIS — R112 Nausea with vomiting, unspecified: Secondary | ICD-10-CM | POA: Diagnosis not present

## 2020-12-09 DIAGNOSIS — N390 Urinary tract infection, site not specified: Secondary | ICD-10-CM | POA: Diagnosis present

## 2020-12-09 DIAGNOSIS — Z751 Person awaiting admission to adequate facility elsewhere: Secondary | ICD-10-CM

## 2020-12-09 DIAGNOSIS — K3184 Gastroparesis: Secondary | ICD-10-CM | POA: Diagnosis present

## 2020-12-09 DIAGNOSIS — T402X5A Adverse effect of other opioids, initial encounter: Secondary | ICD-10-CM | POA: Diagnosis present

## 2020-12-09 DIAGNOSIS — D539 Nutritional anemia, unspecified: Secondary | ICD-10-CM | POA: Diagnosis present

## 2020-12-09 DIAGNOSIS — R0602 Shortness of breath: Secondary | ICD-10-CM

## 2020-12-09 DIAGNOSIS — Z8249 Family history of ischemic heart disease and other diseases of the circulatory system: Secondary | ICD-10-CM

## 2020-12-09 DIAGNOSIS — R109 Unspecified abdominal pain: Secondary | ICD-10-CM

## 2020-12-09 DIAGNOSIS — E876 Hypokalemia: Secondary | ICD-10-CM | POA: Diagnosis present

## 2020-12-09 DIAGNOSIS — G610 Guillain-Barre syndrome: Principal | ICD-10-CM | POA: Diagnosis present

## 2020-12-09 DIAGNOSIS — E8809 Other disorders of plasma-protein metabolism, not elsewhere classified: Secondary | ICD-10-CM | POA: Diagnosis not present

## 2020-12-09 DIAGNOSIS — R270 Ataxia, unspecified: Secondary | ICD-10-CM | POA: Diagnosis present

## 2020-12-09 DIAGNOSIS — E860 Dehydration: Secondary | ICD-10-CM | POA: Diagnosis present

## 2020-12-09 DIAGNOSIS — K8020 Calculus of gallbladder without cholecystitis without obstruction: Secondary | ICD-10-CM | POA: Diagnosis present

## 2020-12-09 DIAGNOSIS — B9620 Unspecified Escherichia coli [E. coli] as the cause of diseases classified elsewhere: Secondary | ICD-10-CM | POA: Diagnosis present

## 2020-12-09 DIAGNOSIS — R040 Epistaxis: Secondary | ICD-10-CM | POA: Diagnosis not present

## 2020-12-09 DIAGNOSIS — K219 Gastro-esophageal reflux disease without esophagitis: Secondary | ICD-10-CM | POA: Diagnosis present

## 2020-12-09 DIAGNOSIS — K279 Peptic ulcer, site unspecified, unspecified as acute or chronic, without hemorrhage or perforation: Secondary | ICD-10-CM | POA: Diagnosis present

## 2020-12-09 DIAGNOSIS — Z6841 Body Mass Index (BMI) 40.0 and over, adult: Secondary | ICD-10-CM

## 2020-12-09 DIAGNOSIS — E873 Alkalosis: Secondary | ICD-10-CM | POA: Diagnosis not present

## 2020-12-09 DIAGNOSIS — Z881 Allergy status to other antibiotic agents status: Secondary | ICD-10-CM

## 2020-12-09 DIAGNOSIS — L249 Irritant contact dermatitis, unspecified cause: Secondary | ICD-10-CM | POA: Diagnosis present

## 2020-12-09 DIAGNOSIS — G629 Polyneuropathy, unspecified: Secondary | ICD-10-CM | POA: Diagnosis present

## 2020-12-09 DIAGNOSIS — G8929 Other chronic pain: Secondary | ICD-10-CM | POA: Diagnosis present

## 2020-12-09 DIAGNOSIS — Y92239 Unspecified place in hospital as the place of occurrence of the external cause: Secondary | ICD-10-CM | POA: Diagnosis not present

## 2020-12-09 DIAGNOSIS — K2970 Gastritis, unspecified, without bleeding: Secondary | ICD-10-CM | POA: Diagnosis present

## 2020-12-09 DIAGNOSIS — R633 Feeding difficulties, unspecified: Secondary | ICD-10-CM | POA: Diagnosis present

## 2020-12-09 DIAGNOSIS — Z8711 Personal history of peptic ulcer disease: Secondary | ICD-10-CM

## 2020-12-09 DIAGNOSIS — K6289 Other specified diseases of anus and rectum: Secondary | ICD-10-CM | POA: Diagnosis not present

## 2020-12-09 DIAGNOSIS — Z87828 Personal history of other (healed) physical injury and trauma: Secondary | ICD-10-CM

## 2020-12-09 DIAGNOSIS — Y92009 Unspecified place in unspecified non-institutional (private) residence as the place of occurrence of the external cause: Secondary | ICD-10-CM

## 2020-12-09 DIAGNOSIS — R7401 Elevation of levels of liver transaminase levels: Secondary | ICD-10-CM

## 2020-12-09 DIAGNOSIS — E669 Obesity, unspecified: Secondary | ICD-10-CM | POA: Diagnosis present

## 2020-12-09 DIAGNOSIS — M7989 Other specified soft tissue disorders: Secondary | ICD-10-CM | POA: Diagnosis present

## 2020-12-09 DIAGNOSIS — M79604 Pain in right leg: Secondary | ICD-10-CM | POA: Diagnosis not present

## 2020-12-09 DIAGNOSIS — Z79891 Long term (current) use of opiate analgesic: Secondary | ICD-10-CM

## 2020-12-09 DIAGNOSIS — K5903 Drug induced constipation: Secondary | ICD-10-CM | POA: Diagnosis present

## 2020-12-09 DIAGNOSIS — F419 Anxiety disorder, unspecified: Secondary | ICD-10-CM | POA: Diagnosis present

## 2020-12-09 DIAGNOSIS — W19XXXD Unspecified fall, subsequent encounter: Secondary | ICD-10-CM | POA: Diagnosis present

## 2020-12-09 DIAGNOSIS — E662 Morbid (severe) obesity with alveolar hypoventilation: Secondary | ICD-10-CM | POA: Diagnosis present

## 2020-12-09 DIAGNOSIS — L89152 Pressure ulcer of sacral region, stage 2: Secondary | ICD-10-CM | POA: Diagnosis present

## 2020-12-09 DIAGNOSIS — R61 Generalized hyperhidrosis: Secondary | ICD-10-CM | POA: Diagnosis not present

## 2020-12-09 DIAGNOSIS — Z1611 Resistance to penicillins: Secondary | ICD-10-CM | POA: Diagnosis present

## 2020-12-09 DIAGNOSIS — M79605 Pain in left leg: Secondary | ICD-10-CM | POA: Diagnosis not present

## 2020-12-09 DIAGNOSIS — Z20822 Contact with and (suspected) exposure to covid-19: Secondary | ICD-10-CM | POA: Diagnosis present

## 2020-12-09 DIAGNOSIS — D500 Iron deficiency anemia secondary to blood loss (chronic): Secondary | ICD-10-CM | POA: Diagnosis present

## 2020-12-09 DIAGNOSIS — L304 Erythema intertrigo: Secondary | ICD-10-CM | POA: Diagnosis present

## 2020-12-09 DIAGNOSIS — E66813 Obesity, class 3: Secondary | ICD-10-CM | POA: Diagnosis present

## 2020-12-09 DIAGNOSIS — R0902 Hypoxemia: Secondary | ICD-10-CM

## 2020-12-09 DIAGNOSIS — R7989 Other specified abnormal findings of blood chemistry: Secondary | ICD-10-CM | POA: Diagnosis not present

## 2020-12-09 DIAGNOSIS — R000 Tachycardia, unspecified: Secondary | ICD-10-CM | POA: Diagnosis present

## 2020-12-09 HISTORY — DX: Nonalcoholic steatohepatitis (NASH): K75.81

## 2020-12-09 HISTORY — DX: Obesity, unspecified: E66.9

## 2020-12-09 HISTORY — DX: Peptic ulcer, site unspecified, unspecified as acute or chronic, without hemorrhage or perforation: K27.9

## 2020-12-09 HISTORY — DX: Morbid (severe) obesity due to excess calories: E66.01

## 2020-12-09 HISTORY — DX: Obesity, class 3: E66.813

## 2020-12-09 LAB — CBC WITH DIFFERENTIAL/PLATELET
Abs Immature Granulocytes: 0.08 10*3/uL — ABNORMAL HIGH (ref 0.00–0.07)
Basophils Absolute: 0 10*3/uL (ref 0.0–0.1)
Basophils Relative: 0 %
Eosinophils Absolute: 0 10*3/uL (ref 0.0–0.5)
Eosinophils Relative: 0 %
HCT: 35.9 % — ABNORMAL LOW (ref 36.0–46.0)
Hemoglobin: 10.9 g/dL — ABNORMAL LOW (ref 12.0–15.0)
Immature Granulocytes: 1 %
Lymphocytes Relative: 22 %
Lymphs Abs: 1.8 10*3/uL (ref 0.7–4.0)
MCH: 29.5 pg (ref 26.0–34.0)
MCHC: 30.4 g/dL (ref 30.0–36.0)
MCV: 97 fL (ref 80.0–100.0)
Monocytes Absolute: 0.6 10*3/uL (ref 0.1–1.0)
Monocytes Relative: 7 %
Neutro Abs: 5.7 10*3/uL (ref 1.7–7.7)
Neutrophils Relative %: 70 %
Platelets: 395 10*3/uL (ref 150–400)
RBC: 3.7 MIL/uL — ABNORMAL LOW (ref 3.87–5.11)
RDW: 30.4 % — ABNORMAL HIGH (ref 11.5–15.5)
WBC: 8.2 10*3/uL (ref 4.0–10.5)
nRBC: 0.4 % — ABNORMAL HIGH (ref 0.0–0.2)

## 2020-12-09 LAB — COMPREHENSIVE METABOLIC PANEL
ALT: 66 U/L — ABNORMAL HIGH (ref 0–44)
AST: 158 U/L — ABNORMAL HIGH (ref 15–41)
Albumin: 3.7 g/dL (ref 3.5–5.0)
Alkaline Phosphatase: 73 U/L (ref 38–126)
Anion gap: 12 (ref 5–15)
BUN: 7 mg/dL (ref 6–20)
CO2: 25 mmol/L (ref 22–32)
Calcium: 9.3 mg/dL (ref 8.9–10.3)
Chloride: 101 mmol/L (ref 98–111)
Creatinine, Ser: 0.64 mg/dL (ref 0.44–1.00)
GFR, Estimated: >60 mL/min (ref >60)
Glucose, Bld: 99 mg/dL (ref 70–99)
Potassium: 3.3 mmol/L — ABNORMAL LOW (ref 3.5–5.1)
Sodium: 138 mmol/L (ref 135–145)
Total Bilirubin: 2 mg/dL — ABNORMAL HIGH (ref 0.3–1.2)
Total Protein: 8.8 g/dL — ABNORMAL HIGH (ref 6.5–8.1)

## 2020-12-09 LAB — RESP PANEL BY RT-PCR (FLU A&B, COVID) ARPGX2
Influenza A by PCR: NEGATIVE
Influenza B by PCR: NEGATIVE
SARS Coronavirus 2 by RT PCR: NEGATIVE

## 2020-12-09 LAB — LACTIC ACID, PLASMA: Lactic Acid, Venous: 2.1 mmol/L (ref 0.5–1.9)

## 2020-12-09 LAB — LIPASE, BLOOD: Lipase: 23 U/L (ref 11–51)

## 2020-12-09 MED ORDER — METOCLOPRAMIDE HCL 5 MG/ML IJ SOLN
10.0000 mg | Freq: Once | INTRAMUSCULAR | Status: AC
  Administered 2020-12-09: 10 mg via INTRAVENOUS
  Filled 2020-12-09: qty 2

## 2020-12-09 MED ORDER — POTASSIUM CHLORIDE IN NACL 20-0.9 MEQ/L-% IV SOLN
INTRAVENOUS | Status: DC
Start: 2020-12-10 — End: 2020-12-17
  Filled 2020-12-09 (×15): qty 1000

## 2020-12-09 MED ORDER — IOHEXOL 350 MG/ML SOLN
100.0000 mL | Freq: Once | INTRAVENOUS | Status: AC | PRN
  Administered 2020-12-09: 100 mL via INTRAVENOUS

## 2020-12-09 MED ORDER — POTASSIUM CHLORIDE IN NACL 20-0.9 MEQ/L-% IV SOLN
INTRAVENOUS | Status: AC
  Filled 2020-12-09: qty 1000

## 2020-12-09 MED ORDER — ACETAMINOPHEN 325 MG PO TABS
650.0000 mg | ORAL_TABLET | Freq: Four times a day (QID) | ORAL | Status: DC | PRN
  Administered 2020-12-25 – 2021-01-17 (×11): 650 mg via ORAL
  Filled 2020-12-09 (×13): qty 2

## 2020-12-09 MED ORDER — SODIUM CHLORIDE 0.9 % IV SOLN
8.0000 mg | Freq: Four times a day (QID) | INTRAVENOUS | Status: DC | PRN
  Administered 2020-12-10 – 2020-12-15 (×6): 8 mg via INTRAVENOUS
  Filled 2020-12-09 (×11): qty 4

## 2020-12-09 MED ORDER — PANTOPRAZOLE SODIUM 40 MG IV SOLR
40.0000 mg | Freq: Two times a day (BID) | INTRAVENOUS | Status: DC
  Administered 2020-12-09 – 2020-12-25 (×32): 40 mg via INTRAVENOUS
  Filled 2020-12-09 (×32): qty 40

## 2020-12-09 MED ORDER — HYDROMORPHONE HCL 2 MG/ML IJ SOLN
1.5000 mg | INTRAMUSCULAR | Status: DC | PRN
  Administered 2020-12-09: 1.5 mg via INTRAVENOUS
  Filled 2020-12-09: qty 1

## 2020-12-09 MED ORDER — LACTATED RINGERS IV BOLUS
1000.0000 mL | Freq: Once | INTRAVENOUS | Status: AC
  Administered 2020-12-09: 1000 mL via INTRAVENOUS

## 2020-12-09 MED ORDER — ACETAMINOPHEN 650 MG RE SUPP: 650.0000 mg | Freq: Four times a day (QID) | RECTAL | Status: DC | PRN

## 2020-12-09 MED ORDER — ONDANSETRON HCL 4 MG PO TABS
8.0000 mg | ORAL_TABLET | Freq: Four times a day (QID) | ORAL | Status: DC | PRN
  Administered 2020-12-13: 8 mg via ORAL
  Filled 2020-12-09: qty 2

## 2020-12-09 MED ORDER — METOPROLOL TARTRATE 5 MG/5ML IV SOLN
5.0000 mg | Freq: Three times a day (TID) | INTRAVENOUS | Status: DC
  Administered 2020-12-09 – 2020-12-10 (×2): 5 mg via INTRAVENOUS
  Filled 2020-12-09 (×2): qty 5

## 2020-12-09 MED ORDER — FAMOTIDINE IN NACL 20-0.9 MG/50ML-% IV SOLN
20.0000 mg | Freq: Once | INTRAVENOUS | Status: AC
  Administered 2020-12-09: 20 mg via INTRAVENOUS
  Filled 2020-12-09: qty 50

## 2020-12-09 MED ORDER — CIPROFLOXACIN HCL 0.3 % OP SOLN
2.0000 [drp] | OPHTHALMIC | Status: DC
  Administered 2020-12-09 – 2021-01-04 (×122): 2 [drp] via OPHTHALMIC
  Filled 2020-12-09 (×4): qty 2.5

## 2020-12-09 MED ORDER — MAGNESIUM SULFATE 2 GM/50ML IV SOLN
2.0000 g | Freq: Once | INTRAVENOUS | Status: AC
  Administered 2020-12-09: 2 g via INTRAVENOUS
  Filled 2020-12-09: qty 50

## 2020-12-09 MED ORDER — METOCLOPRAMIDE HCL 5 MG/ML IJ SOLN
10.0000 mg | Freq: Four times a day (QID) | INTRAMUSCULAR | Status: DC
  Administered 2020-12-09 – 2020-12-10 (×2): 10 mg via INTRAVENOUS
  Filled 2020-12-09 (×2): qty 2

## 2020-12-09 NOTE — H&P (Addendum)
History and Physical    Carla Little EZM:[id number] DOB: Sep 19, 1990 DOA: 12/09/2020  PCP: Patient, No Pcp Per (Inactive)  Patient coming from: Home.  I have personally briefly reviewed patient's old medical records in [place]  Chief Complaint: Abdominal pain, nausea and vomiting.  HPI: Carla Little is a 30 y.o. female with medical history significant of class III obesity, depression, GERD, nonalcoholic steatohepatitis, gastric ulcer who had a 4-week admission after having a mechanical fall resulting in left knee dislocation with MCL strain, ACL and PCL rupture now complicated by intractable nausea and vomiting, persistent tachycardia, UTI with acute urinary retention, acute hypoxic respiratory failure and fecal impaction.  She was seen in the ER about 10 days ago for abdominal pain, nausea and vomiting but was able to go home.  However, the patient stated her symptoms have been exacerbating home, she has been having multiple episodes of emesis daily and her epigastric abdominal pain at times has worsened.  She denied diarrhea, melena or hematochezia.  No dysuria, flank pain, frequency or hematuria.  No fever, chills, sore throat, rhinorrhea, wheezing or hemoptysis.  No chest pain, palpitations, diaphoresis, PND or orthopnea.  The patient occasionally gets lower extremity edema.  No polyuria, polydipsia, polyphagia or blurred vision.  ED Course: Initial vital signs were temperature 98.8 F, pulse 122, respiration 18, BP 148/100 mmHg and O2 sat 98% on room air.  The patient received a 1000 mL LR bolus, famotidine 20 mg IVP x1 and metoclopramide 10 mg IVP x1.  I added NS with KCl 20 mEq 2000 mL over 4 hours, began Protonix and 2 g of supplemental magnesium sulfate IVPB.  Lab work: Her CBC showed a white count of 8.2, hemoglobin 10.9 g/dL platelets 395.  CMP showed a potassium of 3.3 mmol/L.  The rest of the electrolytes and renal function were normal.  Total protein was 8.8 and  albumin 3.7 g/dL.  AST was 158, ALT 66 and total bilirubin 2.0 mg/dL.  Imaging: CTA abdomen/pelvis did not show any acute localizing process in the abdomen or pelvis.  However there was hepatomegaly and hepatic asteatosis.  Please see images and full radiology report for further detail.  Review of Systems: As per HPI otherwise all other systems reviewed and are negative.  Past Medical History:  Diagnosis Date   Class 3 obesity 12/09/2020   Depression    GERD (gastroesophageal reflux disease)    Nonalcoholic steatohepatitis (NASH) 12/09/2020   Obesity    PUD (peptic ulcer disease) 12/09/2020   Past Surgical History:  Procedure Laterality Date   BIOPSY  11/19/2020   Procedure: BIOPSY;  Surgeon: Juanita Craver, MD;  Location: [facility]COPY;  Service: Endoscopy;;   ESOPHAGOGASTRODUODENOSCOPY (EGD) WITH PROPOFOL N/A 11/19/2020   Procedure: ESOPHAGOGASTRODUODENOSCOPY (EGD) WITH PROPOFOL;  Surgeon: Juanita Craver, MD;  Location: [facility] ENDOSCOPY;  Service: Endoscopy;  Laterality: N/A;   Social History  reports that she has never smoked. She has never used smokeless tobacco. She reports current alcohol use. She reports that she does not use drugs.  Allergies  Allergen Reactions   Azithromycin Shortness Of Breath and Nausea And Vomiting   Family medical history Multiple relatives with hypertension.  Prior to Admission medications   Medication Sig Start Date End Date Taking? Authorizing Provider  cholecalciferol (VITAMIN D) 25 MCG tablet Take 2 tablets (2,000 Units total) by mouth daily. Patient not taking: Reported on 11/29/2020 11/28/20   Shawna Clamp, MD  methocarbamol (ROBAXIN) 500 MG tablet Take 1 tablet (500 mg  total) by mouth every 6 (six) hours as needed for muscle spasms. 11/28/20   Shawna Clamp, MD  metoprolol tartrate (LOPRESSOR) 25 MG tablet Take 0.5 tablets (12.5 mg total) by mouth 2 (two) times daily. 11/28/20   Shawna Clamp, MD  ondansetron (ZOFRAN ODT) 4 MG disintegrating tablet Take 1  tablet (4 mg total) by mouth every 8 (eight) hours as needed for nausea or vomiting. 11/29/20   Charlann Lange, PA-C  ondansetron (ZOFRAN) 4 MG tablet Take 1 tablet (4 mg total) by mouth every 6 (six) hours as needed for nausea. Patient not taking: Reported on 11/29/2020 11/28/20   Shawna Clamp, MD  oxyCODONE (OXY IR/ROXICODONE) 5 MG immediate release tablet Take 1 tablet (5 mg total) by mouth every 4 (four) hours as needed for moderate pain (pain score 4-6). 11/29/20   Isla Pence, MD  promethazine (PHENERGAN) 25 MG suppository Place 1 suppository (25 mg total) rectally every 6 (six) hours as needed for nausea or vomiting. 11/29/20   Charlann Lange, PA-C   Physical Exam: Vitals:   12/09/20 1505 12/09/20 1631 12/09/20 1800 12/09/20 1923  BP: (!) 148/100  123/80 126/87  Pulse: (!) 122 (!) 139 (!) 112 (!) 112  Resp: 18  18 (!) 21  Temp: 98.8 F (37.1 C)     TempSrc: Oral     SpO2: 98%  100% 99%   Constitutional: NAD, calm, comfortable Eyes: PERRL, right palpebral erythema and edema.  Sclerae is injected. ENMT: Mucous membranes and lips are dry.  Posterior pharynx clear of any exudate or lesions. Neck: normal, supple, no masses, no thyromegaly Respiratory: Decreased breath sounds in bases, otherwise clear to auscultation bilaterally, no wheezing, no crackles. Normal respiratory effort. No accessory muscle use.  Cardiovascular: Sinus tachycardia in the 110s and 120s, no murmurs / rubs / gallops.  Stage III lymphedema.  No lower extremity pitting edema. 2+ pedal pulses. No carotid bruits.  Abdomen: Obese, no distention.  Bowel sounds positive.  Positive epigastric and positive mild diffuse tenderness, no masses palpated. No hepatosplenomegaly. Bowel sounds positive.  Musculoskeletal: no clubbing / cyanosis. Good ROM, no contractures. Normal muscle tone.  Skin: no rashes, lesions, ulcers on very limited dermatological examination. Neurologic: CN 2-12 grossly intact. Sensation intact, DTR normal.  Strength 5/5 in all 4.  Psychiatric: Normal judgment and insight. Alert and oriented x 3. Normal mood.   Labs on Admission: I have personally reviewed following labs and imaging studies  CBC: Recent Labs  Lab 12/09/20 1552  WBC 8.2  NEUTROABS 5.7  HGB 10.9*  HCT 35.9*  MCV 97.0  PLT 341    Basic Metabolic Panel: Recent Labs  Lab 12/09/20 1552  NA 138  K 3.3*  CL 101  CO2 25  GLUCOSE 99  BUN 7  CREATININE 0.64  CALCIUM 9.3    GFR: CrCl cannot be calculated (Unknown ideal weight.).  Liver Function Tests: Recent Labs  Lab 12/09/20 1552  AST 158*  ALT 66*  ALKPHOS 73  BILITOT 2.0*  PROT 8.8*  ALBUMIN 3.7   Radiological Exams on Admission: CT Angio Abd/Pel W and/or Wo Contrast  Result Date: 12/09/2020 CLINICAL DATA:  Concern for chronic mesenteric ischemia. EXAM: CTA ABDOMEN AND PELVIS WITHOUT AND WITH CONTRAST TECHNIQUE: Multidetector CT imaging of the abdomen and pelvis was performed using the standard protocol during bolus administration of intravenous contrast. Multiplanar reconstructed images and MIPs were obtained and reviewed to evaluate the vascular anatomy. CONTRAST:  152m OMNIPAQUE IOHEXOL 350 MG/ML SOLN COMPARISON:  CT abdomen  and pelvis 06/08/2016. FINDINGS: VASCULAR Aorta: Normal caliber aorta without aneurysm, dissection, vasculitis or significant stenosis. Celiac: Patent without evidence of aneurysm, dissection, vasculitis or significant stenosis. SMA: Patent without evidence of aneurysm, dissection, vasculitis or significant stenosis. Renals: Both renal arteries are patent without evidence of aneurysm, dissection, vasculitis, fibromuscular dysplasia or significant stenosis. IMA: Patent without evidence of aneurysm, dissection, vasculitis or significant stenosis. Inflow: Patent without evidence of aneurysm, dissection, vasculitis or significant stenosis. Proximal Outflow: Bilateral common femoral and visualized portions of the superficial and profunda  femoral arteries are patent without evidence of aneurysm, dissection, vasculitis or significant stenosis. Veins: No obvious venous abnormality within the limitations of this arterial phase study. Review of the MIP images confirms the above findings. NON-VASCULAR Lower chest: No acute abnormality. Hepatobiliary: Liver is enlarged. There is diffuse fatty infiltration of the liver. No calcified gallstones are seen. There is no biliary ductal dilatation identified. Pancreas: Unremarkable. No pancreatic ductal dilatation or surrounding inflammatory changes. Spleen: Normal in size without focal abnormality. Adrenals/Urinary Tract: Adrenal glands are unremarkable. Kidneys are normal, without renal calculi, focal lesion, or hydronephrosis. Bladder is unremarkable. Stomach/Bowel: Stomach is within normal limits. Appendix appears normal. No evidence of bowel wall thickening, distention, or inflammatory changes. Lymphatic: No significant vascular findings are present. No enlarged abdominal or pelvic lymph nodes. Reproductive: Uterus and bilateral adnexa are unremarkable. Other: No abdominal wall hernia or abnormality. No abdominopelvic ascites. Musculoskeletal: No acute or significant osseous findings. IMPRESSION: VASCULAR 1. No acute vascular pathology identified. NON-VASCULAR 1. No acute localizing process in the abdomen or pelvis. 2. Hepatomegaly and hepatic steatosis. Electronically Signed   By: Ronney Asters M.D.   On: 12/09/2020 19:22    11/19/2020 upper endoscopy Findings: LA Grade B (one or more mucosal breaks greater than 5 mm, not extending between the tops of two mucosal folds) esophagitis with no bleeding was found.One small, non-bleeding superficial gastric ulcer was found in the gastric antrum-biopsies were done to rule out H. pylori by pathology. The cardia and gastric fundus were normal on retroflexion. The examined duodenum was normal. - LA Grade B reflux esophagitis with no bleeding. - One small  non-bleeding gastric ulcer in the antrum-biopsied to rule out H. pylori by pathology. - Normal examined duodenum.  Impression: MAC used. Moderate Sedation: - Low fat diet daily. - Continue present medications.  EKG: Independently reviewed.  Vent. rate 111 BPM PR interval 120 ms QRS duration 66 ms QT/QTcB 323/439 ms P-R-T axes 68 40 43 Sinus tachycardia  Assessment/Plan Principal Problem:   Intractable vomiting with nausea Observation/telemetry. Keep NPO. Continue IV fluids. Antiemetics as needed. Analgesics as needed. Protonix 40 mg IVP every 12 hours. If improved will consider clear liquid trial in AM.  Active Problems:   Sinus tachycardia Multifactorial due to: Has not been taking beta-blocker. Volume depletion/electrolyte imbalance Continue IV hydration. Resume beta-blocker.    Hypokalemia Replenishment. Magnesium was supplemented. Follow-up potassium level.    Left knee dislocation Analgesics as needed. Continue PT at home.    PUD (peptic ulcer disease) Continue PPI.    Nonalcoholic steatohepatitis (NASH) Discussed increased risk for cirrhosis. Told about increased risk for diabetes. Weight loss and lifestyle modifications advised    Class 3 obesity Last saw modifications. Follow-up with PCP.    GERD (gastroesophageal reflux disease) Continue PPI.    Iron deficiency anemia due to chronic blood loss Monitor hematocrit and hemoglobin. Transfuse as needed.    DVT prophylaxis: SCDs. Code Status:   Full code. Family Communication:   Disposition  Plan:   Patient is from:  Home.  Anticipated DC to:  Home.  Anticipated DC date:  12/10/2020 or 12/11/2020.  Anticipated DC barriers: Clinical status.  Consults called:   Admission status:  Observation/telemetry.  Severity of Illness:  High severity after presenting with abdominal pain with persistent nausea and vomiting for a week despite using oral antiemetics at home.  Patient will be admitted for  IV hydration, electrolyte replacement and symptoms control.   Reubin Milan MD [facility]  How to contact the [facility] Attending or Consulting provider [place] or covering provider during after hours [place], for this patient?   Check the care team in [facility] and look for a) attending/consulting [facility] provider listed and b) the [facility] team listed Log into [email or web address] and use [place]'s universal password to access. If you do not have the password, please contact the hospital operator. Locate the [facility] provider you are looking for under [facility] and page to a number that you can be directly reached. If you still have difficulty reaching the provider, please page the [facility] (Director on Call) for the Hospitalists listed on amion for assistance.  12/09/2020, 7:55 PM   This document was prepared using Dragon voice recognition software may contain some unintended transcription errors.    MRI revealed a complete ACL tear, PCL tear and MCL strain

## 2020-12-09 NOTE — ED Notes (Signed)
Patient transported to CT 

## 2020-12-09 NOTE — ED Triage Notes (Signed)
Pt arrived via EMS, from home, n/v diarrhea x1 week. Was seen by PCP told stomach ulcer. Sx not improving.

## 2020-12-09 NOTE — ED Notes (Signed)
Spoke wih Mollie in lab to add on lipase

## 2020-12-09 NOTE — ED Notes (Signed)
Patients IV became occluded. Unsuccessful attempts. IV team consult in place.

## 2020-12-09 NOTE — ED Provider Notes (Signed)
Emergency Medicine Provider Triage Evaluation Note  Carla Little , a 30 y.o. female  was evaluated in triage.  Pt complains of nausea and vomiting x1 week.  Endorses recent hospital admission, states they found stomach ulcer on exam she was giving Protonix and Zofran for management of symptoms and discharged.  Now having cyclical vomiting which she is unable to control.  Unable to take meds.  Review of Systems  Positive: Nausea, vomiting Negative: Fevers, chills, shortness of breath, chest pain  Physical Exam  BP (!) 148/100   Pulse (!) 122   Temp 98.8 F (37.1 C) (Oral)   Resp 18   LMP 11/12/2020 (Exact Date)   SpO2 98%  Gen:   Awake, no distress  Resp:  Normal effort MSK:   Moves extremities without difficulty Other:   Medical Decision Making  Medically screening exam initiated at 3:35 PM.  Appropriate orders placed.  Carla Little was informed that the remainder of the evaluation will be completed by another provider, this initial triage assessment does not replace that evaluation, and the importance of remaining in the ED until their evaluation is complete.     Vear Clock 12/09/20 1537    Lorre Nick, MD 12/10/20 1146

## 2020-12-09 NOTE — ED Provider Notes (Signed)
[facility]-EMERGENCY DEPT Provider Note   CSN: [id number] Arrival date & time: 12/09/20  1455     History Chief Complaint  Patient presents with   Emesis   Nausea    Carla Little is a 30 y.o. female MH GERD, peptic ulcer disease, recent admission 10/31/2020 after a left knee dislocation, recent evaluation on 11/29/2020 for intractable nausea and vomiting who presents the emergency department for evaluation of intractable nausea and vomiting.  Patient states that she had a recent EGD on 11/19/2020 that showed evidence of peptic ulcer disease and she was instructed to take a PPI and Carafate.  During her evaluation in the emergency department on 11/29/2020 she was sent home and ultimately had persistence of her nausea and vomiting and she states she is unable to tolerate any food or liquids by mouth.  She endorses epigastric abdominal pain that is postprandial.  Denies chest pain, shortness of breath, diarrhea, headache or fever.  She arrives significantly tachycardic.   Emesis Associated symptoms: abdominal pain   Associated symptoms: no arthralgias, no chills, no cough, no fever and no sore throat       Past Medical History:  Diagnosis Date   Depression    GERD (gastroesophageal reflux disease)    Obesity     Patient Active Problem List   Diagnosis Date Noted   Pressure injury of skin 11/21/2020   Left knee dislocation 11/01/2020   Knee dislocation, left, initial encounter 11/01/2020    Past Surgical History:  Procedure Laterality Date   BIOPSY  11/19/2020   Procedure: BIOPSY;  Surgeon: Charna Elizabeth, MD;  Location: [facility];  Service: Endoscopy;;   ESOPHAGOGASTRODUODENOSCOPY (EGD) WITH PROPOFOL N/A 11/19/2020   Procedure: ESOPHAGOGASTRODUODENOSCOPY (EGD) WITH PROPOFOL;  Surgeon: Charna Elizabeth, MD;  Location: [facility];  Service: Endoscopy;  Laterality: N/A;     OB History   No obstetric history on file.     No family history on file.  Social  History   Tobacco Use   Smoking status: Never   Smokeless tobacco: Never  Substance Use Topics   Alcohol use: Yes   Drug use: No    Home Medications Prior to Admission medications   Medication Sig Start Date End Date Taking? Authorizing Provider  cholecalciferol (VITAMIN D) 25 MCG tablet Take 2 tablets (2,000 Units total) by mouth daily. Patient not taking: Reported on 11/29/2020 11/28/20   Cipriano Bunker, MD  methocarbamol (ROBAXIN) 500 MG tablet Take 1 tablet (500 mg total) by mouth every 6 (six) hours as needed for muscle spasms. 11/28/20   Cipriano Bunker, MD  metoprolol tartrate (LOPRESSOR) 25 MG tablet Take 0.5 tablets (12.5 mg total) by mouth 2 (two) times daily. 11/28/20   Cipriano Bunker, MD  ondansetron (ZOFRAN ODT) 4 MG disintegrating tablet Take 1 tablet (4 mg total) by mouth every 8 (eight) hours as needed for nausea or vomiting. 11/29/20   Elpidio Anis, PA-C  ondansetron (ZOFRAN) 4 MG tablet Take 1 tablet (4 mg total) by mouth every 6 (six) hours as needed for nausea. Patient not taking: Reported on 11/29/2020 11/28/20   Cipriano Bunker, MD  oxyCODONE (OXY IR/ROXICODONE) 5 MG immediate release tablet Take 1 tablet (5 mg total) by mouth every 4 (four) hours as needed for moderate pain (pain score 4-6). 11/29/20   Jacalyn Lefevre, MD  promethazine (PHENERGAN) 25 MG suppository Place 1 suppository (25 mg total) rectally every 6 (six) hours as needed for nausea or vomiting. 11/29/20   Elpidio Anis, PA-C  Allergies    Azithromycin  Review of Systems   Review of Systems  Constitutional:  Negative for chills and fever.  HENT:  Negative for ear pain and sore throat.   Eyes:  Negative for pain and visual disturbance.  Respiratory:  Negative for cough and shortness of breath.   Cardiovascular:  Negative for chest pain and palpitations.  Gastrointestinal:  Positive for abdominal pain, nausea and vomiting.  Genitourinary:  Negative for dysuria and hematuria.  Musculoskeletal:  Negative for  arthralgias and back pain.  Skin:  Negative for color change and rash.  Neurological:  Negative for seizures and syncope.  All other systems reviewed and are negative.  Physical Exam Updated Vital Signs BP (!) 148/100   Pulse (!) 139   Temp 98.8 F (37.1 C) (Oral)   Resp 18   LMP 11/12/2020 (Exact Date)   SpO2 98%   Physical Exam Vitals and nursing note reviewed.  Constitutional:      General: She is not in acute distress.    Appearance: She is well-developed.  HENT:     Head: Normocephalic and atraumatic.  Eyes:     Conjunctiva/sclera: Conjunctivae normal.  Cardiovascular:     Rate and Rhythm: Normal rate and regular rhythm.     Heart sounds: No murmur heard. Pulmonary:     Effort: Pulmonary effort is normal. No respiratory distress.     Breath sounds: Normal breath sounds.  Abdominal:     Palpations: Abdomen is soft.     Tenderness: There is abdominal tenderness (Epigastric).  Musculoskeletal:     Cervical back: Neck supple.  Skin:    General: Skin is warm and dry.  Neurological:     Mental Status: She is alert.    ED Results / Procedures / Treatments   Labs (all labs ordered are listed, but only abnormal results are displayed) Labs Reviewed  CBC WITH DIFFERENTIAL/PLATELET - Abnormal; Notable for the following components:      Result Value   RBC 3.70 (*)    Hemoglobin 10.9 (*)    HCT 35.9 (*)    RDW 30.4 (*)    nRBC 0.4 (*)    All other components within normal limits  COMPREHENSIVE METABOLIC PANEL    EKG None  Radiology No results found.  Procedures Procedures   Medications Ordered in ED Medications - No data to display  ED Course  I have reviewed the triage vital signs and the nursing notes.  Pertinent labs & imaging results that were available during my care of the patient were reviewed by me and considered in my medical decision making (see chart for details).    MDM Rules/Calculators/A&P                           Patient seen  emergency department for evaluation of intractable nausea and vomiting, abdominal pain.  Physical exam reveals epigastric tenderness to palpation but is otherwise unremarkable.  Patient arrives severely tachycardic to 122 and a fluid resuscitation started with nausea control with Reglan 10 mg.  Laboratory evaluation reveals hypokalemia to 3.3, AST elevated to 158, ALT 66, total bili elevated to 2.0.  CBC with anemia to 10.9, lipase unremarkable.  CT angiography of the abdomen pelvis was obtained to rule out chronic mesenteric ischemia as the patient has had a significant GI work-up already and this is an area that has not been explored previously.  These results are currently pending and I will follow-up  for any surgical pathology.  Patient having persistent symptoms and persistent tachycardia on reevaluation.  She is already attempted symptom control at home and is having persistent nausea and vomiting and I am highly concerned that if discharged today she will decompensate as she is unable to eat and any oral medications I give the patient's will not be absorbed due to her persistent vomiting.  She will require admission for intractable nausea and vomiting. Final Clinical Impression(s) / ED Diagnoses Final diagnoses:  None    Rx / DC Orders ED Discharge Orders     None        Seibert Keeter, Wyn Forster, MD 12/09/20 1911

## 2020-12-09 NOTE — ED Notes (Addendum)
Pt pivot to [facility] with assistance. Pt on her cycle

## 2020-12-09 NOTE — ED Notes (Signed)
Dr. Sanda Klein wants to continue with the 5mg  Metoprolol IV with patients BP of 119/94 HR 119.

## 2020-12-10 ENCOUNTER — Encounter (HOSPITAL_COMMUNITY): Payer: Self-pay | Admitting: Internal Medicine

## 2020-12-10 ENCOUNTER — Other Ambulatory Visit: Payer: Self-pay

## 2020-12-10 DIAGNOSIS — Z6841 Body Mass Index (BMI) 40.0 and over, adult: Secondary | ICD-10-CM | POA: Diagnosis not present

## 2020-12-10 DIAGNOSIS — M79604 Pain in right leg: Secondary | ICD-10-CM | POA: Diagnosis not present

## 2020-12-10 DIAGNOSIS — Z79899 Other long term (current) drug therapy: Secondary | ICD-10-CM | POA: Diagnosis not present

## 2020-12-10 DIAGNOSIS — K7581 Nonalcoholic steatohepatitis (NASH): Secondary | ICD-10-CM | POA: Diagnosis present

## 2020-12-10 DIAGNOSIS — L89152 Pressure ulcer of sacral region, stage 2: Secondary | ICD-10-CM | POA: Diagnosis present

## 2020-12-10 DIAGNOSIS — E876 Hypokalemia: Secondary | ICD-10-CM | POA: Diagnosis present

## 2020-12-10 DIAGNOSIS — D500 Iron deficiency anemia secondary to blood loss (chronic): Secondary | ICD-10-CM | POA: Diagnosis present

## 2020-12-10 DIAGNOSIS — K3184 Gastroparesis: Secondary | ICD-10-CM | POA: Diagnosis present

## 2020-12-10 DIAGNOSIS — L89312 Pressure ulcer of right buttock, stage 2: Secondary | ICD-10-CM | POA: Diagnosis present

## 2020-12-10 DIAGNOSIS — F32A Depression, unspecified: Secondary | ICD-10-CM | POA: Diagnosis present

## 2020-12-10 DIAGNOSIS — Y92009 Unspecified place in unspecified non-institutional (private) residence as the place of occurrence of the external cause: Secondary | ICD-10-CM | POA: Diagnosis not present

## 2020-12-10 DIAGNOSIS — E669 Obesity, unspecified: Secondary | ICD-10-CM | POA: Diagnosis not present

## 2020-12-10 DIAGNOSIS — Z881 Allergy status to other antibiotic agents status: Secondary | ICD-10-CM | POA: Diagnosis not present

## 2020-12-10 DIAGNOSIS — B9620 Unspecified Escherichia coli [E. coli] as the cause of diseases classified elsewhere: Secondary | ICD-10-CM | POA: Diagnosis present

## 2020-12-10 DIAGNOSIS — E039 Hypothyroidism, unspecified: Secondary | ICD-10-CM | POA: Diagnosis present

## 2020-12-10 DIAGNOSIS — Y92239 Unspecified place in hospital as the place of occurrence of the external cause: Secondary | ICD-10-CM | POA: Diagnosis not present

## 2020-12-10 DIAGNOSIS — K257 Chronic gastric ulcer without hemorrhage or perforation: Secondary | ICD-10-CM | POA: Diagnosis not present

## 2020-12-10 DIAGNOSIS — R601 Generalized edema: Secondary | ICD-10-CM | POA: Diagnosis not present

## 2020-12-10 DIAGNOSIS — W19XXXD Unspecified fall, subsequent encounter: Secondary | ICD-10-CM | POA: Diagnosis present

## 2020-12-10 DIAGNOSIS — E873 Alkalosis: Secondary | ICD-10-CM | POA: Diagnosis not present

## 2020-12-10 DIAGNOSIS — R609 Edema, unspecified: Secondary | ICD-10-CM | POA: Diagnosis not present

## 2020-12-10 DIAGNOSIS — R112 Nausea with vomiting, unspecified: Secondary | ICD-10-CM | POA: Diagnosis present

## 2020-12-10 DIAGNOSIS — E46 Unspecified protein-calorie malnutrition: Secondary | ICD-10-CM | POA: Diagnosis present

## 2020-12-10 DIAGNOSIS — G629 Polyneuropathy, unspecified: Secondary | ICD-10-CM | POA: Diagnosis present

## 2020-12-10 DIAGNOSIS — E8809 Other disorders of plasma-protein metabolism, not elsewhere classified: Secondary | ICD-10-CM | POA: Diagnosis not present

## 2020-12-10 DIAGNOSIS — Z20822 Contact with and (suspected) exposure to covid-19: Secondary | ICD-10-CM | POA: Diagnosis present

## 2020-12-10 DIAGNOSIS — E662 Morbid (severe) obesity with alveolar hypoventilation: Secondary | ICD-10-CM | POA: Diagnosis present

## 2020-12-10 DIAGNOSIS — N390 Urinary tract infection, site not specified: Secondary | ICD-10-CM | POA: Diagnosis present

## 2020-12-10 DIAGNOSIS — E860 Dehydration: Secondary | ICD-10-CM | POA: Diagnosis present

## 2020-12-10 DIAGNOSIS — K219 Gastro-esophageal reflux disease without esophagitis: Secondary | ICD-10-CM | POA: Diagnosis not present

## 2020-12-10 DIAGNOSIS — D539 Nutritional anemia, unspecified: Secondary | ICD-10-CM | POA: Diagnosis present

## 2020-12-10 DIAGNOSIS — S83105D Unspecified dislocation of left knee, subsequent encounter: Secondary | ICD-10-CM | POA: Diagnosis not present

## 2020-12-10 DIAGNOSIS — G610 Guillain-Barre syndrome: Secondary | ICD-10-CM | POA: Diagnosis present

## 2020-12-10 DIAGNOSIS — Z1611 Resistance to penicillins: Secondary | ICD-10-CM | POA: Diagnosis present

## 2020-12-10 DIAGNOSIS — K2100 Gastro-esophageal reflux disease with esophagitis, without bleeding: Secondary | ICD-10-CM | POA: Diagnosis not present

## 2020-12-10 LAB — COMPREHENSIVE METABOLIC PANEL
ALT: 56 U/L — ABNORMAL HIGH (ref 0–44)
AST: 117 U/L — ABNORMAL HIGH (ref 15–41)
Albumin: 3.4 g/dL — ABNORMAL LOW (ref 3.5–5.0)
Alkaline Phosphatase: 62 U/L (ref 38–126)
Anion gap: 12 (ref 5–15)
BUN: 7 mg/dL (ref 6–20)
CO2: 24 mmol/L (ref 22–32)
Calcium: 9.2 mg/dL (ref 8.9–10.3)
Chloride: 107 mmol/L (ref 98–111)
Creatinine, Ser: 0.44 mg/dL (ref 0.44–1.00)
GFR, Estimated: >60 mL/min (ref >60)
Glucose, Bld: 88 mg/dL (ref 70–99)
Potassium: 4.5 mmol/L (ref 3.5–5.1)
Sodium: 143 mmol/L (ref 135–145)
Total Bilirubin: 2.3 mg/dL — ABNORMAL HIGH (ref 0.3–1.2)
Total Protein: 8.3 g/dL — ABNORMAL HIGH (ref 6.5–8.1)

## 2020-12-10 LAB — CBC
HCT: 34.1 % — ABNORMAL LOW (ref 36.0–46.0)
Hemoglobin: 10.4 g/dL — ABNORMAL LOW (ref 12.0–15.0)
MCH: 30.1 pg (ref 26.0–34.0)
MCHC: 30.5 g/dL (ref 30.0–36.0)
MCV: 98.8 fL (ref 80.0–100.0)
Platelets: 341 10*3/uL (ref 150–400)
RBC: 3.45 MIL/uL — ABNORMAL LOW (ref 3.87–5.11)
RDW: 31 % — ABNORMAL HIGH (ref 11.5–15.5)
WBC: 7.9 10*3/uL (ref 4.0–10.5)
nRBC: 0.3 % — ABNORMAL HIGH (ref 0.0–0.2)

## 2020-12-10 LAB — HIV ANTIBODY (ROUTINE TESTING W REFLEX): HIV Screen 4th Generation wRfx: NONREACTIVE

## 2020-12-10 LAB — LACTIC ACID, PLASMA: Lactic Acid, Venous: 1.4 mmol/L (ref 0.5–1.9)

## 2020-12-10 LAB — GLUCOSE, CAPILLARY: Glucose-Capillary: 93 mg/dL (ref 70–99)

## 2020-12-10 MED ORDER — HYDROMORPHONE HCL 1 MG/ML IJ SOLN
0.5000 mg | INTRAMUSCULAR | Status: DC | PRN
  Administered 2020-12-10 – 2021-01-04 (×50): 0.5 mg via INTRAVENOUS
  Filled 2020-12-10 (×57): qty 0.5

## 2020-12-10 MED ORDER — SODIUM CHLORIDE 0.9 % IV SOLN
12.5000 mg | Freq: Three times a day (TID) | INTRAVENOUS | Status: DC
  Administered 2020-12-10 – 2020-12-13 (×12): 12.5 mg via INTRAVENOUS
  Filled 2020-12-10 (×5): qty 12.5
  Filled 2020-12-10: qty 0.5
  Filled 2020-12-10 (×7): qty 12.5

## 2020-12-10 MED ORDER — METOPROLOL TARTRATE 5 MG/5ML IV SOLN
5.0000 mg | Freq: Four times a day (QID) | INTRAVENOUS | Status: DC
  Administered 2020-12-10 – 2020-12-11 (×3): 5 mg via INTRAVENOUS
  Filled 2020-12-10 (×3): qty 5

## 2020-12-10 MED ORDER — METOPROLOL TARTRATE 25 MG PO TABS
12.5000 mg | ORAL_TABLET | Freq: Two times a day (BID) | ORAL | Status: DC
  Administered 2020-12-10: 12.5 mg via ORAL
  Filled 2020-12-10: qty 1

## 2020-12-10 NOTE — ED Notes (Signed)
Patient assisted to the bedside commode.

## 2020-12-10 NOTE — Progress Notes (Addendum)
   12/10/20 1234  Assess: MEWS Score  Temp 98.1 F (36.7 C)  BP (!) 119/95  Pulse Rate (!) (S)  125 (RN was not made aware at this time.)  Resp 18  Level of Consciousness Alert  SpO2 93 %  O2 Device Room Air  Assess: MEWS Score  MEWS Temp 0  MEWS Systolic 0  MEWS Pulse 2  MEWS RR 0  MEWS LOC 0  MEWS Score 2  MEWS Score Color Yellow  Assess: if the MEWS score is Yellow or Red  Were vital signs taken at a resting state? Yes  Focused Assessment No change from prior assessment  Does the patient meet 2 or more of the SIRS criteria? No  Does the patient have a confirmed or suspected source of infection? No  MEWS guidelines implemented *See Row Information* Yes  Treat  MEWS Interventions Administered scheduled meds/treatments;Escalated (See documentation below)  Pain Scale 0-10  Pain Score 0  Take Vital Signs  Increase Vital Sign Frequency  Yellow: Q 2hr X 2 then Q 4hr X 2, if remains yellow, continue Q 4hrs  Escalate  MEWS: Escalate Yellow: discuss with charge nurse/RN and consider discussing with provider and RRT  Notify: Charge Nurse/RN  Name of Charge Nurse/RN Notified [place], RN  Date Charge Nurse/RN Notified 12/10/20  Time Charge Nurse/RN Notified 1415  Notify: Provider  Provider Name/Title Jomarie Longs, MD  Date Provider Notified 12/10/20  Time Provider Notified 1418  Notification Type Page  Notification Reason Other (Comment) (yellow MEWS)  Provider response Other (Comment) (scheduled medications given)  Document  Patient Outcome Other (Comment) (No new orders at this time. Patient given scheduled BP medications.)  Progress note created (see row info) Yes  Assess: SIRS CRITERIA  SIRS Temperature  0  SIRS Pulse 1  SIRS Respirations  0  SIRS WBC 0  SIRS Score Sum  1  Pt in yellow MEWS. Yellow MEWS protocol initiated. Consulting civil engineer notified. MD made aware. HR 110 when assessed by this RN.

## 2020-12-10 NOTE — Progress Notes (Signed)
PROGRESS NOTE    Carla Little  WEX:[id number] DOB: 1990-08-01 DOA: 12/09/2020 PCP: Patient, No Pcp Per (Inactive)  Brief Narrative: 30 year old female with morbid obesity, nonalcoholic fatty liver disease, GERD, depression, recently hospitalized following a mechanical fall with left knee dislocation and resultant MCL strain, ACL and PCL rupture, complicated by nausea vomiting tachycardia UTI and fecal impaction, during recent hospitalization she was treated for nausea and vomiting with supportive care, had an endoscopy which noted a small antral ulcer on 8/27, gallbladder ultrasound was unremarkable, presents to the ED with multiple episodes of nausea and vomiting also has aches and pains everywhere. In the ED she was afebrile, mildly tachycardic, hypertensive, labs were notable for mildly abnormal AST ALT and bilirubin of 2, urinalysis is pending, COVID PCR was negative, WBC was normal, CT abdomen pelvis noted hepatomegaly and fatty liver disease   Assessment & Plan:   Nausea and vomiting -Long history of same, had prolonged course of nausea and vomiting during her recent hospitalization as well, improved with supportive care -Recent EGD noted small antral ulcer, continue PPI twice daily -CT abdomen pelvis notable for hepatomegaly and fatty liver disease, no ductal dilation, no gallstones noted -Recent right upper quadrant ultrasound on 8/28 noted no gallstones -Trend LFTs -Check urinalysis and urine culture -Start clears, continue IV fluids today, DC Reglan, add scheduled iv Phenergan today -Educated patient on need to limit narcotics, decrease Dilaudid dose  Tachycardia,  -likely from dehydration and beta-blocker withdrawal -Resumed metoprolol  Hypokalemia -Replaced, check mag level  Obesity Nonalcoholic fatty liver disease -Needs weight loss and lifestyle modification  Iron deficiency anemia -h/o menorrhagia, FU with GYN -Monitor hemoglobin  Recent left knee  injuries -Supportive care  DVT prophylaxis: SCDs, heavy menorrhagia on her cycles now Code Status: Full code Family Communication: No family at bedside, discussed patient in detail Disposition Plan:  Status is: Observation  The patient will require care spanning > 2 midnights and should be moved to inpatient because: IV treatments appropriate due to intensity of illness or inability to take PO  Dispo: The patient is from: Home              Anticipated d/c is to: Home              Patient currently is not medically stable to d/c.   Difficult to place patient No        Consultants:    Procedures:   Antimicrobials:    Subjective: -  Objective: Vitals:   12/10/20 0600 12/10/20 0730 12/10/20 0740 12/10/20 0825  BP: 124/89 124/87  129/72  Pulse: 100 (!) 106  (!) 108  Resp: (!) 22 20  17   Temp:   98.3 F (36.8 C) 98.6 F (37 C)  TempSrc:   Oral Oral  SpO2: 100% 100%  99%    Intake/Output Summary (Last 24 hours) at 12/10/2020 0955 Last data filed at 12/10/2020 0012 Gross per 24 hour  Intake 2100.55 ml  Output --  Net 2100.55 ml   There were no vitals filed for this visit.  Examination:  General exam: Morbidly obese female laying in bed, AAOx3, no distress HEENT: Neck obese unable to assess JVD CVS: S1-S2, regular rate rhythm Lungs: Poor air movement bilaterally, distant breath sounds Abdomen: Obese, soft, nontender, bowel sounds present Extremities: No edema Skin: No rash on exposed skin  Psychiatry: Mood & affect appropriate.     Data Reviewed:   CBC: Recent Labs  Lab 12/09/20 1552 12/10/20 0421  WBC 8.2  7.9  NEUTROABS 5.7  --   HGB 10.9* 10.4*  HCT 35.9* 34.1*  MCV 97.0 98.8  PLT 395 341   Basic Metabolic Panel: Recent Labs  Lab 12/09/20 1552 12/10/20 0421  NA 138 143  K 3.3* 4.5  CL 101 107  CO2 25 24  GLUCOSE 99 88  BUN 7 7  CREATININE 0.64 0.44  CALCIUM 9.3 9.2   GFR: CrCl cannot be calculated (Unknown ideal weight.). Liver  Function Tests: Recent Labs  Lab 12/09/20 1552 12/10/20 0421  AST 158* 117*  ALT 66* 56*  ALKPHOS 73 62  BILITOT 2.0* 2.3*  PROT 8.8* 8.3*  ALBUMIN 3.7 3.4*   Recent Labs  Lab 12/09/20 1552  LIPASE 23   No results for input(s): AMMONIA in the last 168 hours. Coagulation Profile: No results for input(s): INR, PROTIME in the last 168 hours. Cardiac Enzymes: No results for input(s): CKTOTAL, CKMB, CKMBINDEX, TROPONINI in the last 168 hours. BNP (last 3 results) No results for input(s): PROBNP in the last 8760 hours. HbA1C: No results for input(s): HGBA1C in the last 72 hours. CBG: Recent Labs  Lab 12/10/20 0832  GLUCAP 93   Lipid Profile: No results for input(s): CHOL, HDL, LDLCALC, TRIG, CHOLHDL, LDLDIRECT in the last 72 hours. Thyroid Function Tests: No results for input(s): TSH, T4TOTAL, FREET4, T3FREE, THYROIDAB in the last 72 hours. Anemia Panel: No results for input(s): VITAMINB12, FOLATE, FERRITIN, TIBC, IRON, RETICCTPCT in the last 72 hours. Urine analysis:    Component Value Date/Time   COLORURINE YELLOW 11/30/2020 0643   APPEARANCEUR CLEAR 11/30/2020 0643   LABSPEC 1.020 11/30/2020 0643   PHURINE 7.0 11/30/2020 0643   GLUCOSEU NEGATIVE 11/30/2020 0643   HGBUR NEGATIVE 11/30/2020 0643   BILIRUBINUR SMALL (A) 11/30/2020 0643   KETONESUR 15 (A) 11/30/2020 0643   PROTEINUR NEGATIVE 11/30/2020 0643   UROBILINOGEN 1.0 10/27/2013 2116   NITRITE NEGATIVE 11/30/2020 0643   LEUKOCYTESUR NEGATIVE 11/30/2020 0643   Sepsis Labs: @LABRCNTIP (procalcitonin:4,lacticidven:4)  ) Recent Results (from the past 240 hour(s))  Resp Panel by RT-PCR (Flu A&B, Covid) Nasopharyngeal Swab     Status: None   Collection Time: 12/09/20  8:10 PM   Specimen: Nasopharyngeal Swab; Nasopharyngeal(NP) swabs in vial transport medium  Result Value Ref Range Status   SARS Coronavirus 2 by RT PCR NEGATIVE NEGATIVE Final    Comment: (NOTE) SARS-CoV-2 target nucleic acids are NOT  DETECTED.  The SARS-CoV-2 RNA is generally detectable in upper respiratory specimens during the acute phase of infection. The lowest concentration of SARS-CoV-2 viral copies this assay can detect is 138 copies/mL. A negative result does not preclude SARS-Cov-2 infection and should not be used as the sole basis for treatment or other patient management decisions. A negative result may occur with  improper specimen collection/handling, submission of specimen other than nasopharyngeal swab, presence of viral mutation(s) within the areas targeted by this assay, and inadequate number of viral copies(<138 copies/mL). A negative result must be combined with clinical observations, patient history, and epidemiological information. The expected result is Negative.  Fact Sheet for Patients:  12/11/20  Fact Sheet for Healthcare Providers:  BloggerCourse.com  This test is no t yet approved or cleared by the [place] FDA and  has been authorized for detection and/or diagnosis of SARS-CoV-2 by FDA under an Emergency Use Authorization (EUA). This EUA will remain  in effect (meaning this test can be used) for the duration of the COVID-19 declaration under Section 564(b)(1) of the Act, 21 U.S.C.section  360bbb-3(b)(1), unless the authorization is terminated  or revoked sooner.       Influenza A by PCR NEGATIVE NEGATIVE Final   Influenza B by PCR NEGATIVE NEGATIVE Final    Comment: (NOTE) The Xpert Xpress SARS-CoV-2/FLU/RSV plus assay is intended as an aid in the diagnosis of influenza from Nasopharyngeal swab specimens and should not be used as a sole basis for treatment. Nasal washings and aspirates are unacceptable for Xpert Xpress SARS-CoV-2/FLU/RSV testing.  Fact Sheet for Patients: BloggerCourse.com  Fact Sheet for Healthcare Providers: SeriousBroker.it  This test is not yet  approved or cleared by the [place] FDA and has been authorized for detection and/or diagnosis of SARS-CoV-2 by FDA under an Emergency Use Authorization (EUA). This EUA will remain in effect (meaning this test can be used) for the duration of the COVID-19 declaration under Section 564(b)(1) of the Act, 21 U.S.C. section 360bbb-3(b)(1), unless the authorization is terminated or revoked.  Performed at [facility], [address] [place], [place] [address]          Radiology Studies: CT Angio Abd/Pel W and/or Wo Contrast  Result Date: 12/09/2020 CLINICAL DATA:  Concern for chronic mesenteric ischemia. EXAM: CTA ABDOMEN AND PELVIS WITHOUT AND WITH CONTRAST TECHNIQUE: Multidetector CT imaging of the abdomen and pelvis was performed using the standard protocol during bolus administration of intravenous contrast. Multiplanar reconstructed images and MIPs were obtained and reviewed to evaluate the vascular anatomy. CONTRAST:  OMNIPAQUE IOHEXOL 350 MG/ML SOLN COMPARISON:  CT abdomen and pelvis 06/08/2016. FINDINGS: VASCULAR Aorta: Normal caliber aorta without aneurysm, dissection, vasculitis or significant stenosis. Celiac: Patent without evidence of aneurysm, dissection, vasculitis or significant stenosis. SMA: Patent without evidence of aneurysm, dissection, vasculitis or significant stenosis. Renals: Both renal arteries are patent without evidence of aneurysm, dissection, vasculitis, fibromuscular dysplasia or significant stenosis. IMA: Patent without evidence of aneurysm, dissection, vasculitis or significant stenosis. Inflow: Patent without evidence of aneurysm, dissection, vasculitis or significant stenosis. Proximal Outflow: Bilateral common femoral and visualized portions of the superficial and profunda femoral arteries are patent without evidence of aneurysm, dissection, vasculitis or significant stenosis. Veins: No obvious venous abnormality within the limitations  of this arterial phase study. Review of the MIP images confirms the above findings. NON-VASCULAR Lower chest: No acute abnormality. Hepatobiliary: Liver is enlarged. There is diffuse fatty infiltration of the liver. No calcified gallstones are seen. There is no biliary ductal dilatation identified. Pancreas: Unremarkable. No pancreatic ductal dilatation or surrounding inflammatory changes. Spleen: Normal in size without focal abnormality. Adrenals/Urinary Tract: Adrenal glands are unremarkable. Kidneys are normal, without renal calculi, focal lesion, or hydronephrosis. Bladder is unremarkable. Stomach/Bowel: Stomach is within normal limits. Appendix appears normal. No evidence of bowel wall thickening, distention, or inflammatory changes. Lymphatic: No significant vascular findings are present. No enlarged abdominal or pelvic lymph nodes. Reproductive: Uterus and bilateral adnexa are unremarkable. Other: No abdominal wall hernia or abnormality. No abdominopelvic ascites. Musculoskeletal: No acute or significant osseous findings. IMPRESSION: VASCULAR 1. No acute vascular pathology identified. NON-VASCULAR 1. No acute localizing process in the abdomen or pelvis. 2. Hepatomegaly and hepatic steatosis. Electronically Signed   By: Darliss Cheney M.D.   On: 12/09/2020 19:22        Scheduled Meds:  ciprofloxacin  2 drop Right Eye Q4H while awake   metoCLOPramide (REGLAN) injection  10 mg Intravenous Q6H   metoprolol tartrate  5 mg Intravenous Q8H   pantoprazole (PROTONIX) IV  40 mg Intravenous Q12H   Continuous Infusions:  0.9 % NaCl with KCl 20 mEq / L 150 mL/hr at 12/10/20 0513   ondansetron (ZOFRAN) IV     promethazine (PHENERGAN) injection (IM or IVPB)       LOS: 0 days    Time spent:  Zannie Cove, MD [facility]   12/10/2020, 9:55 AM

## 2020-12-11 ENCOUNTER — Inpatient Hospital Stay (HOSPITAL_COMMUNITY): Payer: Medicaid Other

## 2020-12-11 LAB — COMPREHENSIVE METABOLIC PANEL
ALT: 45 U/L — ABNORMAL HIGH (ref 0–44)
AST: 86 U/L — ABNORMAL HIGH (ref 15–41)
Albumin: 3.1 g/dL — ABNORMAL LOW (ref 3.5–5.0)
Alkaline Phosphatase: 54 U/L (ref 38–126)
Anion gap: 7 (ref 5–15)
BUN: 8 mg/dL (ref 6–20)
CO2: 27 mmol/L (ref 22–32)
Calcium: 8.5 mg/dL — ABNORMAL LOW (ref 8.9–10.3)
Chloride: 107 mmol/L (ref 98–111)
Creatinine, Ser: 0.56 mg/dL (ref 0.44–1.00)
GFR, Estimated: >60 mL/min (ref >60)
Glucose, Bld: 107 mg/dL — ABNORMAL HIGH (ref 70–99)
Potassium: 3.7 mmol/L (ref 3.5–5.1)
Sodium: 141 mmol/L (ref 135–145)
Total Bilirubin: 2 mg/dL — ABNORMAL HIGH (ref 0.3–1.2)
Total Protein: 7.4 g/dL (ref 6.5–8.1)

## 2020-12-11 LAB — URINALYSIS, ROUTINE W REFLEX MICROSCOPIC
Bilirubin Urine: NEGATIVE
Glucose, UA: NEGATIVE mg/dL
Ketones, ur: 5 mg/dL — AB
Nitrite: NEGATIVE
Protein, ur: 100 mg/dL — AB
RBC / HPF: >50 RBC/hpf — ABNORMAL HIGH (ref 0–5)
Specific Gravity, Urine: 1.02 (ref 1.005–1.030)
WBC, UA: >50 WBC/hpf — ABNORMAL HIGH (ref 0–5)
pH: 5 (ref 5.0–8.0)

## 2020-12-11 LAB — CBC
HCT: 30 % — ABNORMAL LOW (ref 36.0–46.0)
Hemoglobin: 9.2 g/dL — ABNORMAL LOW (ref 12.0–15.0)
MCH: 30.2 pg (ref 26.0–34.0)
MCHC: 30.7 g/dL (ref 30.0–36.0)
MCV: 98.4 fL (ref 80.0–100.0)
Platelets: 297 10*3/uL (ref 150–400)
RBC: 3.05 MIL/uL — ABNORMAL LOW (ref 3.87–5.11)
RDW: 31 % — ABNORMAL HIGH (ref 11.5–15.5)
WBC: 6.3 10*3/uL (ref 4.0–10.5)
nRBC: 0.5 % — ABNORMAL HIGH (ref 0.0–0.2)

## 2020-12-11 MED ORDER — METOPROLOL TARTRATE 5 MG/5ML IV SOLN
7.5000 mg | Freq: Four times a day (QID) | INTRAVENOUS | Status: DC
  Administered 2020-12-11 – 2020-12-13 (×9): 7.5 mg via INTRAVENOUS
  Filled 2020-12-11 (×9): qty 10

## 2020-12-11 MED ORDER — MENTHOL 3 MG MT LOZG
1.0000 | LOZENGE | OROMUCOSAL | Status: DC | PRN
  Administered 2020-12-11 – 2020-12-14 (×2): 3 mg via ORAL
  Filled 2020-12-11 (×2): qty 9

## 2020-12-11 MED ORDER — SODIUM CHLORIDE 0.9 % IV SOLN
1.0000 g | INTRAVENOUS | Status: DC
  Administered 2020-12-11: 1 g via INTRAVENOUS
  Filled 2020-12-11: qty 10
  Filled 2020-12-11: qty 1
  Filled 2020-12-11: qty 10

## 2020-12-11 NOTE — Progress Notes (Signed)
Pt's MEWS changed to yellow due to sinus tachycardia (113 bpm) and tachypnea (24 RR). She is also complaining of SOB and sating at 100% on 2L [place]. The yellow MEWS protocol was initiated and the hospitalist on coverage was notified Bruna Potter, NP).

## 2020-12-11 NOTE — Consult Note (Signed)
WOC Nurse Consult Note: Reason for Consult: intertriginous dermatitis, irritant contact dermatitis at skin folds: inframammary, subpannicular and bilateral inguinal. Stage 2 to sacrum Wound type:Moisture, irritant contact dermatitis, pressure  ICD-10 CM Codes for Irritant Dermatitis  L30.4  - Erythema intertrigo. Also used for abrasion of the hand, chafing of the skin, dermatitis due to sweating and friction, friction dermatitis, friction eczema, and genital/thigh intertrigo.   Pressure Injury POA: Yes Measurement:2cm x 2cm x 0.1cm Wound bed:red, moist Drainage (amount, consistency, odor) small  Periwound:intact Dressing procedure/placement/frequency: I will provide guidance for Nursing on the care of the intertriginous dermatitis using our antimicrobial wicking textile, Arneta Cliche Barbaraann Cao # [phone]). Instructions have been provided for Nursing in the Orders and are also here:  Measure and cut length of InterDry to fit in skin folds that have skin breakdown Tuck InterDry fabric into skin folds in a single layer, allow for 2 inches of overhang from skin edges to allow for wicking to occur May remove to bathe; dry area thoroughly and then tuck into affected areas again Do not apply any creams or ointments when using InterDry DO NOT THROW AWAY FOR 5 DAYS unless soiled with stool DO NOT [facility] product, this will inactivate the silver in the material  New sheet of Interdry should be applied after 5 days of use if patient continues to have skin breakdown    The stage 2 pressure injury is to be managed using our skin care order set, i.e. covering the affected area with xeroform gauze, topping with dry gauze and covering with a silicone bordered foam.  Turning and repositioning is in place, time in the supine position is to be minimized.  WOC nursing team will not follow, but will remain available to this patient, the nursing and medical teams.  Please re-consult if needed. Thanks, Ladona Mow,  MSN, RN, GNP, [place]  Pager# [phone]

## 2020-12-11 NOTE — Progress Notes (Addendum)
PROGRESS NOTE    Carla Little  OBS:[id number] DOB: 1990-07-08 DOA: 12/09/2020 PCP: Patient, No Pcp Per (Inactive)  Brief Narrative: 30 year old female with morbid obesity, nonalcoholic fatty liver disease, GERD, depression, recently hospitalized following a mechanical fall with left knee dislocation and resultant MCL strain, ACL and PCL rupture, complicated by nausea vomiting tachycardia UTI and fecal impaction, during recent hospitalization she was treated for nausea and vomiting with supportive care, had an endoscopy which noted a small antral ulcer on 8/27, gallbladder ultrasound was unremarkable, presents to the ED with multiple episodes of nausea and vomiting also has aches and pains everywhere. In the ED she was afebrile, mildly tachycardic, hypertensive, labs were notable for mildly abnormal AST ALT and bilirubin of 2, urinalysis is pending, COVID PCR was negative, WBC was normal, CT abdomen pelvis noted hepatomegaly and fatty liver disease   Assessment & Plan:   Nausea and vomiting Possible UTI -Long history of same, had prolonged course of nausea and vomiting during her recent hospitalization as well, improved with supportive care -Recent EGD noted small antral ulcer, continue PPI twice daily -CT abdomen pelvis notable for hepatomegaly and fatty liver disease, no ductal dilation, no gallstones noted -Recent right upper quadrant ultrasound on 8/28 noted no gallstones, LFTs improving -Urinalysis is abnormal, patient does report some dysuria and frequency, will add IV ceftriaxone, follow-up urine cultures -Continue clears today, IV fluids, IV Phenergan scheduled and Zofran as needed -Educated patient on need to limit narcotics as tolerated  Tachycardia,  -likely from dehydration and beta-blocker withdrawal -Resumed metoprolol IV for now  Hypokalemia -Replaced  Obesity Nonalcoholic fatty liver disease -Needs weight loss and lifestyle modification  Iron deficiency  anemia -h/o menorrhagia, FU with GYN -Monitor hemoglobin  Recent left knee injuries -Supportive care  DVT prophylaxis: SCDs, heavy menorrhagia on her cycles now Code Status: Full code Family Communication: No family at bedside, discussed patient in detail Disposition Plan:  Status is: Observation  The patient will require care spanning > 2 midnights and should be moved to inpatient because: IV treatments appropriate due to intensity of illness or inability to take PO  Dispo: The patient is from: Home              Anticipated d/c is to: Home              Patient currently is not medically stable to d/c.   Difficult to place patient No        Consultants:    Procedures:   Antimicrobials:  Ceftriaxone 9/18  Subjective: -Had some vomiting last night, none so far this morning, complains of nausea and some lower abdominal discomfort, also complains of dysuria  Objective: Vitals:   12/10/20 1635 12/10/20 2035 12/11/20 0021 12/11/20 0032  BP: 114/69 109/70 137/89   Pulse: (!) 120 (!) 112 99   Resp: 17 (!) 24 (!) 22   Temp: 98.1 F (36.7 C) 97.8 F (36.6 C)  98 F (36.7 C)  TempSrc:  Oral  Oral  SpO2: 95% 97% 100%     Intake/Output Summary (Last 24 hours) at 12/11/2020 1019 Last data filed at 12/11/2020 4765 Gross per 24 hour  Intake 594.65 ml  Output 200 ml  Net 394.65 ml   There were no vitals filed for this visit.  Examination:  General exam: Morbidly obese female laying in bed, AAOx3, no distress HEENT: Neck obese unable to assess JVD CVS: S1-S2, regular rhythm, tachycardic Lungs: Poor air movement bilaterally, distant breath sounds Abdomen: Obese, soft,  nontender, bowel sounds present Extremities: No edema  Skin: No rash on exposed skin  Psychiatry: Mood & affect appropriate.     Data Reviewed:   CBC: Recent Labs  Lab 12/09/20 1552 12/10/20 0421 12/11/20 0603  WBC 8.2 7.9 6.3  NEUTROABS 5.7  --   --   HGB 10.9* 10.4* 9.2*  HCT 35.9* 34.1*  30.0*  MCV 97.0 98.8 98.4  PLT 395 341 297   Basic Metabolic Panel: Recent Labs  Lab 12/09/20 1552 12/10/20 0421 12/11/20 0603  NA 138 143 141  K 3.3* 4.5 3.7  CL 101 107 107  CO2 25 24 27   GLUCOSE 99 88 107*  BUN 7 7 8   CREATININE 0.64 0.44 0.56  CALCIUM 9.3 9.2 8.5*   GFR: CrCl cannot be calculated (Unknown ideal weight.). Liver Function Tests: Recent Labs  Lab 12/09/20 1552 12/10/20 0421 12/11/20 0603  AST 158* 117* 86*  ALT 66* 56* 45*  ALKPHOS 73 62 54  BILITOT 2.0* 2.3* 2.0*  PROT 8.8* 8.3* 7.4  ALBUMIN 3.7 3.4* 3.1*   Recent Labs  Lab 12/09/20 1552  LIPASE 23   No results for input(s): AMMONIA in the last 168 hours. Coagulation Profile: No results for input(s): INR, PROTIME in the last 168 hours. Cardiac Enzymes: No results for input(s): CKTOTAL, CKMB, CKMBINDEX, TROPONINI in the last 168 hours. BNP (last 3 results) No results for input(s): PROBNP in the last 8760 hours. HbA1C: No results for input(s): HGBA1C in the last 72 hours. CBG: Recent Labs  Lab 12/10/20 0832  GLUCAP 93   Lipid Profile: No results for input(s): CHOL, HDL, LDLCALC, TRIG, CHOLHDL, LDLDIRECT in the last 72 hours. Thyroid Function Tests: No results for input(s): TSH, T4TOTAL, FREET4, T3FREE, THYROIDAB in the last 72 hours. Anemia Panel: No results for input(s): VITAMINB12, FOLATE, FERRITIN, TIBC, IRON, RETICCTPCT in the last 72 hours. Urine analysis:    Component Value Date/Time   COLORURINE AMBER (A) 12/11/2020 0037   APPEARANCEUR CLOUDY (A) 12/11/2020 0037   LABSPEC 1.020 12/11/2020 0037   PHURINE 5.0 12/11/2020 0037   GLUCOSEU NEGATIVE 12/11/2020 0037   HGBUR LARGE (A) 12/11/2020 0037   BILIRUBINUR NEGATIVE 12/11/2020 0037   KETONESUR 5 (A) 12/11/2020 0037   PROTEINUR 100 (A) 12/11/2020 0037   UROBILINOGEN 1.0 10/27/2013 2116   NITRITE NEGATIVE 12/11/2020 0037   LEUKOCYTESUR LARGE (A) 12/11/2020 0037   Sepsis  Labs: @LABRCNTIP (procalcitonin:4,lacticidven:4)  ) Recent Results (from the past 240 hour(s))  Resp Panel by RT-PCR (Flu A&B, Covid) Nasopharyngeal Swab     Status: None   Collection Time: 12/09/20  8:10 PM   Specimen: Nasopharyngeal Swab; Nasopharyngeal(NP) swabs in vial transport medium  Result Value Ref Range Status   SARS Coronavirus 2 by RT PCR NEGATIVE NEGATIVE Final    Comment: (NOTE) SARS-CoV-2 target nucleic acids are NOT DETECTED.  The SARS-CoV-2 RNA is generally detectable in upper respiratory specimens during the acute phase of infection. The lowest concentration of SARS-CoV-2 viral copies this assay can detect is 138 copies/mL. A negative result does not preclude SARS-Cov-2 infection and should not be used as the sole basis for treatment or other patient management decisions. A negative result may occur with  improper specimen collection/handling, submission of specimen other than nasopharyngeal swab, presence of viral mutation(s) within the areas targeted by this assay, and inadequate number of viral copies(<138 copies/mL). A negative result must be combined with clinical observations, patient history, and epidemiological information. The expected result is Negative.  Fact Sheet for Patients:  BloggerCourse.com  Fact Sheet for Healthcare Providers:  SeriousBroker.it  This test is no t yet approved or cleared by the [place] FDA and  has been authorized for detection and/or diagnosis of SARS-CoV-2 by FDA under an Emergency Use Authorization (EUA). This EUA will remain  in effect (meaning this test can be used) for the duration of the COVID-19 declaration under Section 564(b)(1) of the Act, 21 U.S.C.section 360bbb-3(b)(1), unless the authorization is terminated  or revoked sooner.       Influenza A by PCR NEGATIVE NEGATIVE Final   Influenza B by PCR NEGATIVE NEGATIVE Final    Comment: (NOTE) The Xpert  Xpress SARS-CoV-2/FLU/RSV plus assay is intended as an aid in the diagnosis of influenza from Nasopharyngeal swab specimens and should not be used as a sole basis for treatment. Nasal washings and aspirates are unacceptable for Xpert Xpress SARS-CoV-2/FLU/RSV testing.  Fact Sheet for Patients: BloggerCourse.com  Fact Sheet for Healthcare Providers: SeriousBroker.it  This test is not yet approved or cleared by the [place] FDA and has been authorized for detection and/or diagnosis of SARS-CoV-2 by FDA under an Emergency Use Authorization (EUA). This EUA will remain in effect (meaning this test can be used) for the duration of the COVID-19 declaration under Section 564(b)(1) of the Act, 21 U.S.C. section 360bbb-3(b)(1), unless the authorization is terminated or revoked.  Performed at [facility], [address] [place], [place] [address]          Radiology Studies: CT Angio Abd/Pel W and/or Wo Contrast  Result Date: 12/09/2020 CLINICAL DATA:  Concern for chronic mesenteric ischemia. EXAM: CTA ABDOMEN AND PELVIS WITHOUT AND WITH CONTRAST TECHNIQUE: Multidetector CT imaging of the abdomen and pelvis was performed using the standard protocol during bolus administration of intravenous contrast. Multiplanar reconstructed images and MIPs were obtained and reviewed to evaluate the vascular anatomy. CONTRAST:  OMNIPAQUE IOHEXOL 350 MG/ML SOLN COMPARISON:  CT abdomen and pelvis 06/08/2016. FINDINGS: VASCULAR Aorta: Normal caliber aorta without aneurysm, dissection, vasculitis or significant stenosis. Celiac: Patent without evidence of aneurysm, dissection, vasculitis or significant stenosis. SMA: Patent without evidence of aneurysm, dissection, vasculitis or significant stenosis. Renals: Both renal arteries are patent without evidence of aneurysm, dissection, vasculitis, fibromuscular dysplasia or significant  stenosis. IMA: Patent without evidence of aneurysm, dissection, vasculitis or significant stenosis. Inflow: Patent without evidence of aneurysm, dissection, vasculitis or significant stenosis. Proximal Outflow: Bilateral common femoral and visualized portions of the superficial and profunda femoral arteries are patent without evidence of aneurysm, dissection, vasculitis or significant stenosis. Veins: No obvious venous abnormality within the limitations of this arterial phase study. Review of the MIP images confirms the above findings. NON-VASCULAR Lower chest: No acute abnormality. Hepatobiliary: Liver is enlarged. There is diffuse fatty infiltration of the liver. No calcified gallstones are seen. There is no biliary ductal dilatation identified. Pancreas: Unremarkable. No pancreatic ductal dilatation or surrounding inflammatory changes. Spleen: Normal in size without focal abnormality. Adrenals/Urinary Tract: Adrenal glands are unremarkable. Kidneys are normal, without renal calculi, focal lesion, or hydronephrosis. Bladder is unremarkable. Stomach/Bowel: Stomach is within normal limits. Appendix appears normal. No evidence of bowel wall thickening, distention, or inflammatory changes. Lymphatic: No significant vascular findings are present. No enlarged abdominal or pelvic lymph nodes. Reproductive: Uterus and bilateral adnexa are unremarkable. Other: No abdominal wall hernia or abnormality. No abdominopelvic ascites. Musculoskeletal: No acute or significant osseous findings. IMPRESSION: VASCULAR 1. No acute vascular pathology identified. NON-VASCULAR 1. No acute localizing process in the abdomen or pelvis.  2. Hepatomegaly and hepatic steatosis. Electronically Signed   By: Darliss Cheney M.D.   On: 12/09/2020 19:22        Scheduled Meds:  ciprofloxacin  2 drop Right Eye Q4H while awake   metoprolol tartrate  7.5 mg Intravenous Q6H   pantoprazole (PROTONIX) IV  40 mg Intravenous Q12H   Continuous  Infusions:  0.9 % NaCl with KCl 20 mEq / L 75 mL/hr at 12/10/20 2235   cefTRIAXone (ROCEPHIN)  IV     ondansetron (ZOFRAN) IV 8 mg (12/10/20 2323)   promethazine (PHENERGAN) injection (IM or IVPB) 12.5 mg (12/11/20 0858)     LOS: 1 day    Time spent:  Zannie Cove, MD [facility]   12/11/2020, 10:19 AM

## 2020-12-11 NOTE — Progress Notes (Signed)
Pt alert and oriented x4. Call bell within reach. Bed is in the lowest position. Moderate Falls Risk. Tolerating PRN pain med well. Pt still has c/o nausea with no active vomiting. Moderate amount of menstrual bleeding. Will continue to monitor any changes.

## 2020-12-12 ENCOUNTER — Encounter: Payer: Self-pay | Admitting: Orthopaedic Surgery

## 2020-12-12 ENCOUNTER — Inpatient Hospital Stay: Payer: Self-pay | Admitting: Orthopaedic Surgery

## 2020-12-12 DIAGNOSIS — B9620 Unspecified Escherichia coli [E. coli] as the cause of diseases classified elsewhere: Secondary | ICD-10-CM | POA: Diagnosis present

## 2020-12-12 DIAGNOSIS — K219 Gastro-esophageal reflux disease without esophagitis: Secondary | ICD-10-CM

## 2020-12-12 DIAGNOSIS — N390 Urinary tract infection, site not specified: Secondary | ICD-10-CM | POA: Diagnosis present

## 2020-12-12 DIAGNOSIS — R000 Tachycardia, unspecified: Secondary | ICD-10-CM

## 2020-12-12 DIAGNOSIS — E876 Hypokalemia: Secondary | ICD-10-CM

## 2020-12-12 DIAGNOSIS — K7581 Nonalcoholic steatohepatitis (NASH): Secondary | ICD-10-CM

## 2020-12-12 DIAGNOSIS — E669 Obesity, unspecified: Secondary | ICD-10-CM

## 2020-12-12 DIAGNOSIS — D500 Iron deficiency anemia secondary to blood loss (chronic): Secondary | ICD-10-CM

## 2020-12-12 LAB — BASIC METABOLIC PANEL
Anion gap: 9 (ref 5–15)
BUN: 8 mg/dL (ref 6–20)
CO2: 25 mmol/L (ref 22–32)
Calcium: 8.8 mg/dL — ABNORMAL LOW (ref 8.9–10.3)
Chloride: 110 mmol/L (ref 98–111)
Creatinine, Ser: 0.53 mg/dL (ref 0.44–1.00)
GFR, Estimated: >60 mL/min (ref >60)
Glucose, Bld: 95 mg/dL (ref 70–99)
Potassium: 4.1 mmol/L (ref 3.5–5.1)
Sodium: 144 mmol/L (ref 135–145)

## 2020-12-12 LAB — CBC
HCT: 29.3 % — ABNORMAL LOW (ref 36.0–46.0)
Hemoglobin: 8.9 g/dL — ABNORMAL LOW (ref 12.0–15.0)
MCH: 30 pg (ref 26.0–34.0)
MCHC: 30.4 g/dL (ref 30.0–36.0)
MCV: 98.7 fL (ref 80.0–100.0)
Platelets: 284 10*3/uL (ref 150–400)
RBC: 2.97 MIL/uL — ABNORMAL LOW (ref 3.87–5.11)
RDW: 31.3 % — ABNORMAL HIGH (ref 11.5–15.5)
WBC: 6.2 10*3/uL (ref 4.0–10.5)
nRBC: 0.8 % — ABNORMAL HIGH (ref 0.0–0.2)

## 2020-12-12 LAB — MAGNESIUM: Magnesium: 1.9 mg/dL (ref 1.7–2.4)

## 2020-12-12 MED ORDER — SODIUM CHLORIDE 0.9 % IV SOLN
2.0000 g | INTRAVENOUS | Status: DC
  Administered 2020-12-12: 2 g via INTRAVENOUS
  Filled 2020-12-12: qty 2
  Filled 2020-12-12: qty 20

## 2020-12-12 MED ORDER — SENNOSIDES-DOCUSATE SODIUM 8.6-50 MG PO TABS
1.0000 | ORAL_TABLET | Freq: Two times a day (BID) | ORAL | Status: DC
  Administered 2020-12-13 – 2021-01-26 (×45): 1 via ORAL
  Filled 2020-12-12 (×72): qty 1

## 2020-12-12 NOTE — Progress Notes (Signed)
Pt is injury free, afebrile, alert, and oriented. Pt was having tachycardia and tachypnea which both improved with 2 L oxygen via [place] and scheduled metoprolol. The hospitalist was notified and chest X-Ray. Would care was performed as directed by orders. Pt complained right knee pain, which improves with current PRN medications. She denies chest pain, nausea or vomiting. Will continue to monitor

## 2020-12-12 NOTE — Telephone Encounter (Signed)
Pt called and wanting to make sure her appt was cancelled as she is in the hospital and pt wanted to know how she will be able to see Dr. Magnus Ivan for her follow up. The best call back number for the pt is [phone].

## 2020-12-12 NOTE — Progress Notes (Signed)
PROGRESS NOTE    Carla Little  UXL:[id number] DOB: December 06, 1990 DOA: 12/09/2020 PCP: Patient, No Pcp Per (Inactive)    Chief Complaint  Patient presents with   Emesis   Nausea    Brief Narrative:  30 year old female with morbid obesity, nonalcoholic fatty liver disease, GERD, depression, recently hospitalized following a mechanical fall with left knee dislocation and resultant MCL strain, ACL and PCL rupture, complicated by nausea vomiting tachycardia UTI and fecal impaction, during recent hospitalization she was treated for nausea and vomiting with supportive care, had an endoscopy which noted a small antral ulcer on 8/27, gallbladder ultrasound was unremarkable, presents to the ED with multiple episodes of nausea and vomiting also has aches and pains everywhere. In the ED she was afebrile, mildly tachycardic, hypertensive, labs were notable for mildly abnormal AST ALT and bilirubin of 2, urinalysis is pending, COVID PCR was negative, WBC was normal, CT abdomen pelvis noted hepatomegaly and fatty liver disease   Assessment & Plan:   Principal Problem:   Intractable vomiting with nausea Active Problems:   Left knee dislocation   PUD (peptic ulcer disease)   Nonalcoholic steatohepatitis (NASH)   Class 3 obesity   Sinus tachycardia   Hypokalemia   GERD (gastroesophageal reflux disease)   Iron deficiency anemia due to chronic blood loss   E. coli UTI  #1 nausea/vomiting/E. coli UTI -Patient noted to have a long history and prolonged course of nausea and vomiting during recent hospitalization which improved with supportive care. -EGD done recently with small antral ulcer. -CT abdomen and pelvis with hepatomegaly, fatty liver disease, no ductal dilatation, no gallstones noted. -Patient with recent right upper quadrant ultrasound on 11/20/2020 with no gallstones.  LFTs trending down. -Patient with abnormal urinalysis, symptomatic with dysuria, urine cultures with> 100,000 colonies  of E. coli and 20,000 colonies of Proteus mirabilis with sensitivities pending. -Patient still with emesis however wants to continue on clears. -Continue IV Rocephin, IV antiemetics, IV fluids, supportive care.  2.  Sinus tachycardia -Likely secondary to dehydration and beta-blocker withdrawal. -Tachycardia improved with IV metoprolol and IV fluids.  3.  Hypokalemia -Likely secondary to GI losses. -Magnesium at 1.9. -Potassium at 4.1. -Repeat labs in the a.m.  4.  Iron deficiency anemia -With heavy menorrhagia on her cycle. -Follow H&H. -Transfusion threshold hemoglobin < 7.  5.  Morbid obesity/NAFLD -Needs weight loss and lifestyle modification.  6.  Recent left knee injuries -Supportive care. -PT/OT.  7.  GERD -PPI.   DVT prophylaxis: SCDs, heavy menorrhagia on her cycle now. Code Status: Full Family Communication: Updated patient.  No family at bedside. Disposition:   Status is: Inpatient  Remains inpatient appropriate because:Inpatient level of care appropriate due to severity of illness  Dispo: The patient is from: Home              Anticipated d/c is to: Home              Patient currently is not medically stable to d/c.   Difficult to place patient No       Consultants:  None  Procedures:  CT angiogram abdomen and pelvis 08/08/2021 Chest x-ray 12/11/2020  Antimicrobials:  IV Rocephin 12/11/2020>>>>>    Subjective: Patient laying in bed.  Still with complaints of nausea and emesis.  Complains of dysuria.  Complains of diffuse abdominal pain.  Wants to continue with clear liquids.  Objective: Vitals:   12/12/20 0452 12/12/20 1004 12/12/20 1307 12/12/20 2147  BP: 130/79 115/73 118/80 121/82  Pulse: 93 (!) 114 (!) 106 98  Resp: (!) 22 20  16   Temp: 97.8 F (36.6 C) 98.5 F (36.9 C) 99.5 F (37.5 C) 98.4 F (36.9 C)  TempSrc: Oral Oral Oral Oral  SpO2: 100% 95% 100% 100%    Intake/Output Summary (Last 24 hours) at 12/12/2020 2159 Last data  filed at 12/12/2020 1400 Gross per 24 hour  Intake 249.87 ml  Output --  Net 249.87 ml   There were no vitals filed for this visit.  Examination:  General exam: Appears calm and comfortable  Respiratory system: Clear to auscultation anterior lung fields.12/14/2020 Respiratory effort normal. Cardiovascular system: S1 & S2 heard, RRR. No JVD, murmurs, rubs, gallops or clicks. No pedal edema. Gastrointestinal system: Abdomen is nondistended, obese, soft and diffusely tender to palpation mid abdominal region.  Positive bowel sounds.  No rebound.  No guarding..  Central nervous system: Alert and oriented. No focal neurological deficits. Extremities: Symmetric 5 x 5 power. Skin: No rashes, lesions or ulcers Psychiatry: Judgement and insight appear normal. Mood & affect appropriate.     Data Reviewed: I have personally reviewed following labs and imaging studies  CBC: Recent Labs  Lab 12/09/20 1552 12/10/20 0421 12/11/20 0603 12/12/20 0623  WBC 8.2 7.9 6.3 6.2  NEUTROABS 5.7  --   --   --   HGB 10.9* 10.4* 9.2* 8.9*  HCT 35.9* 34.1* 30.0* 29.3*  MCV 97.0 98.8 98.4 98.7  PLT 395 341 297 284    Basic Metabolic Panel: Recent Labs  Lab 12/09/20 1552 12/10/20 0421 12/11/20 0603 12/12/20 0623  NA 138 143 141 144  K 3.3* 4.5 3.7 4.1  CL 101 107 107 110  CO2 25 24 27 25   GLUCOSE 99 88 107* 95  BUN 7 7 8 8   CREATININE 0.64 0.44 0.56 0.53  CALCIUM 9.3 9.2 8.5* 8.8*  MG  --   --   --  1.9    GFR: CrCl cannot be calculated (Unknown ideal weight.).  Liver Function Tests: Recent Labs  Lab 12/09/20 1552 12/10/20 0421 12/11/20 0603  AST 158* 117* 86*  ALT 66* 56* 45*  ALKPHOS 73 62 54  BILITOT 2.0* 2.3* 2.0*  PROT 8.8* 8.3* 7.4  ALBUMIN 3.7 3.4* 3.1*    CBG: Recent Labs  Lab 12/10/20 0832  GLUCAP 93     Recent Results (from the past 240 hour(s))  Resp Panel by RT-PCR (Flu A&B, Covid) Nasopharyngeal Swab     Status: None   Collection Time: 12/09/20  8:10 PM    Specimen: Nasopharyngeal Swab; Nasopharyngeal(NP) swabs in vial transport medium  Result Value Ref Range Status   SARS Coronavirus 2 by RT PCR NEGATIVE NEGATIVE Final    Comment: (NOTE) SARS-CoV-2 target nucleic acids are NOT DETECTED.  The SARS-CoV-2 RNA is generally detectable in upper respiratory specimens during the acute phase of infection. The lowest concentration of SARS-CoV-2 viral copies this assay can detect is 138 copies/mL. A negative result does not preclude SARS-Cov-2 infection and should not be used as the sole basis for treatment or other patient management decisions. A negative result may occur with  improper specimen collection/handling, submission of specimen other than nasopharyngeal swab, presence of viral mutation(s) within the areas targeted by this assay, and inadequate number of viral copies(<138 copies/mL). A negative result must be combined with clinical observations, patient history, and epidemiological information. The expected result is Negative.  Fact Sheet for Patients:  12/13/20  Fact Sheet for Healthcare Providers:  12/12/20  This test is no t yet approved or cleared by the [place] and  has been authorized for detection and/or diagnosis of SARS-CoV-2 by FDA under an Emergency Use Authorization (EUA). This EUA will remain  in effect (meaning this test can be used) for the duration of the COVID-19 declaration under Section 564(b)(1) of the Act, 21 U.S.C.section 360bbb-3(b)(1), unless the authorization is terminated  or revoked sooner.       Influenza A by PCR NEGATIVE NEGATIVE Final   Influenza B by PCR NEGATIVE NEGATIVE Final    Comment: (NOTE) The Xpert Xpress SARS-CoV-2/FLU/RSV plus assay is intended as an aid in the diagnosis of influenza from Nasopharyngeal swab specimens and should not be used as a sole basis for treatment. Nasal washings and aspirates are  unacceptable for Xpert Xpress SARS-CoV-2/FLU/RSV testing.  Fact Sheet for Patients: BloggerCourse.com  Fact Sheet for Healthcare Providers: SeriousBroker.it  This test is not yet approved or cleared by the [place] FDA and has been authorized for detection and/or diagnosis of SARS-CoV-2 by FDA under an Emergency Use Authorization (EUA). This EUA will remain in effect (meaning this test can be used) for the duration of the COVID-19 declaration under Section 564(b)(1) of the Act, 21 U.S.C. section 360bbb-3(b)(1), unless the authorization is terminated or revoked.  Performed at [facility], [address], [place], [place] [address]   Urine Culture     Status: Abnormal (Preliminary result)   Collection Time: 12/11/20 12:37 AM   Specimen: Urine, Random  Result Value Ref Range Status   Specimen Description   Final    URINE, RANDOM Performed at [facility], [address], [place], [place] [address]    Special Requests   Final    NONE Performed at [facility], [address], [place], [place] [address]    Culture (A)  Final    >=100,000 COLONIES/mL ESCHERICHIA COLI 20,000 COLONIES/mL PROTEUS MIRABILIS SUSCEPTIBILITIES TO FOLLOW Performed at [facility], [address], [place], [place] [address]    Report Status PENDING  Incomplete         Radiology Studies: DG Chest 1 View  Result Date: 12/11/2020 CLINICAL DATA:  Shortness of breath EXAM: CHEST  1 VIEW COMPARISON:  05/16/2018. FINDINGS: Evaluation is limited by body habitus and positioning. Low lung volumes. The heart is likely normal in size, given projection. No focal pulmonary opacity. No definite pleural effusion or pneumothorax. No acute osseous abnormality. IMPRESSION: Evaluation is limited by body habitus, positioning, and low lung volumes. Within this limitation, no acute cardiopulmonary process.  Electronically Signed   By: Wiliam Ke M.D.   On: 12/11/2020 21:48        Scheduled Meds:  ciprofloxacin  2 drop Right Eye Q4H while awake   metoprolol tartrate  7.5 mg Intravenous Q6H   pantoprazole (PROTONIX) IV  40 mg Intravenous Q12H   Continuous Infusions:  0.9 % NaCl with KCl 20 mEq / L 75 mL/hr at 12/12/20 1702   cefTRIAXone (ROCEPHIN)  IV 2 g (12/12/20 1027)   ondansetron (ZOFRAN) IV 8 mg (12/12/20 1834)   promethazine (PHENERGAN) injection (IM or IVPB) 12.5 mg (12/12/20 1704)     LOS: 2 days    Time spent: 35 minutes.    Ramiro Harvest, MD [facility]   To contact the attending provider between 7A-7P or the covering provider during after hours 7P-7A, please log into the web site [email or web address] and access using universal [place] password  for that web site. If you do not have the password, please call the hospital operator.  12/12/2020, 9:59 PM

## 2020-12-12 NOTE — Plan of Care (Signed)

## 2020-12-12 NOTE — Plan of Care (Signed)
  Problem: Education: Goal: Knowledge of General Education information will improve Description: Including pain rating scale, medication(s)/side effects and non-pharmacologic comfort measures Outcome: Progressing   Problem: Health Behavior/Discharge Planning: Goal: Ability to manage health-related needs will improve Outcome: Not Progressing   Problem: Clinical Measurements: Goal: Ability to maintain clinical measurements within normal limits will improve Outcome: Progressing Goal: Will remain free from infection Outcome: Progressing Goal: Diagnostic test results will improve Outcome: Progressing Goal: Respiratory complications will improve Outcome: Progressing Goal: Cardiovascular complication will be avoided Outcome: Progressing   

## 2020-12-13 ENCOUNTER — Inpatient Hospital Stay (HOSPITAL_COMMUNITY): Payer: Medicaid Other

## 2020-12-13 LAB — HEPATITIS PANEL, ACUTE
HCV Ab: NONREACTIVE
Hep A IgM: NONREACTIVE
Hep B C IgM: NONREACTIVE
Hepatitis B Surface Ag: NONREACTIVE

## 2020-12-13 LAB — CBC WITH DIFFERENTIAL/PLATELET
Abs Immature Granulocytes: 0.08 10*3/uL — ABNORMAL HIGH (ref 0.00–0.07)
Basophils Absolute: 0 10*3/uL (ref 0.0–0.1)
Basophils Relative: 0 %
Eosinophils Absolute: 0 10*3/uL (ref 0.0–0.5)
Eosinophils Relative: 0 %
HCT: 28.6 % — ABNORMAL LOW (ref 36.0–46.0)
Hemoglobin: 8.8 g/dL — ABNORMAL LOW (ref 12.0–15.0)
Immature Granulocytes: 2 %
Lymphocytes Relative: 46 %
Lymphs Abs: 2.2 10*3/uL (ref 0.7–4.0)
MCH: 30.6 pg (ref 26.0–34.0)
MCHC: 30.8 g/dL (ref 30.0–36.0)
MCV: 99.3 fL (ref 80.0–100.0)
Monocytes Absolute: 0.3 10*3/uL (ref 0.1–1.0)
Monocytes Relative: 6 %
Neutro Abs: 2.2 10*3/uL (ref 1.7–7.7)
Neutrophils Relative %: 46 %
Platelets: 269 10*3/uL (ref 150–400)
RBC: 2.88 MIL/uL — ABNORMAL LOW (ref 3.87–5.11)
RDW: 30.9 % — ABNORMAL HIGH (ref 11.5–15.5)
WBC: 4.8 10*3/uL (ref 4.0–10.5)
nRBC: 0.6 % — ABNORMAL HIGH (ref 0.0–0.2)

## 2020-12-13 LAB — COMPREHENSIVE METABOLIC PANEL
ALT: 51 U/L — ABNORMAL HIGH (ref 0–44)
AST: 130 U/L — ABNORMAL HIGH (ref 15–41)
Albumin: 3 g/dL — ABNORMAL LOW (ref 3.5–5.0)
Alkaline Phosphatase: 50 U/L (ref 38–126)
Anion gap: 7 (ref 5–15)
BUN: 6 mg/dL (ref 6–20)
CO2: 28 mmol/L (ref 22–32)
Calcium: 8.5 mg/dL — ABNORMAL LOW (ref 8.9–10.3)
Chloride: 105 mmol/L (ref 98–111)
Creatinine, Ser: 0.49 mg/dL (ref 0.44–1.00)
GFR, Estimated: >60 mL/min (ref >60)
Glucose, Bld: 92 mg/dL (ref 70–99)
Potassium: 3.8 mmol/L (ref 3.5–5.1)
Sodium: 140 mmol/L (ref 135–145)
Total Bilirubin: 1.6 mg/dL — ABNORMAL HIGH (ref 0.3–1.2)
Total Protein: 7.1 g/dL (ref 6.5–8.1)

## 2020-12-13 LAB — URINE CULTURE: Culture: >=100000 — AB

## 2020-12-13 LAB — MAGNESIUM: Magnesium: 1.9 mg/dL (ref 1.7–2.4)

## 2020-12-13 LAB — GLUCOSE, CAPILLARY: Glucose-Capillary: 96 mg/dL (ref 70–99)

## 2020-12-13 MED ORDER — SIMETHICONE 80 MG PO CHEW
160.0000 mg | CHEWABLE_TABLET | Freq: Four times a day (QID) | ORAL | Status: AC
  Administered 2020-12-14 – 2020-12-19 (×7): 160 mg via ORAL
  Filled 2020-12-13 (×16): qty 2

## 2020-12-13 MED ORDER — BISACODYL 10 MG RE SUPP
10.0000 mg | Freq: Once | RECTAL | Status: DC
  Filled 2020-12-13: qty 1

## 2020-12-13 MED ORDER — METRONIDAZOLE 500 MG/100ML IV SOLN
500.0000 mg | Freq: Three times a day (TID) | INTRAVENOUS | Status: DC
  Administered 2020-12-13 – 2020-12-16 (×9): 500 mg via INTRAVENOUS
  Filled 2020-12-13 (×9): qty 100

## 2020-12-13 MED ORDER — SODIUM CHLORIDE 0.9 % IV BOLUS
1000.0000 mL | Freq: Once | INTRAVENOUS | Status: AC
  Administered 2020-12-13: 1000 mL via INTRAVENOUS

## 2020-12-13 MED ORDER — METOPROLOL TARTRATE 5 MG/5ML IV SOLN
10.0000 mg | Freq: Four times a day (QID) | INTRAVENOUS | Status: DC
  Administered 2020-12-14 – 2021-01-01 (×73): 10 mg via INTRAVENOUS
  Filled 2020-12-13 (×77): qty 10

## 2020-12-13 MED ORDER — CEFAZOLIN SODIUM-DEXTROSE 2-4 GM/100ML-% IV SOLN
2.0000 g | Freq: Three times a day (TID) | INTRAVENOUS | Status: DC
  Administered 2020-12-13 – 2020-12-16 (×11): 2 g via INTRAVENOUS
  Filled 2020-12-13 (×12): qty 100

## 2020-12-13 MED ORDER — PROCHLORPERAZINE EDISYLATE 10 MG/2ML IJ SOLN
10.0000 mg | Freq: Three times a day (TID) | INTRAMUSCULAR | Status: DC
  Administered 2020-12-13 – 2021-01-01 (×56): 10 mg via INTRAVENOUS
  Filled 2020-12-13 (×58): qty 2

## 2020-12-13 NOTE — Evaluation (Signed)
Physical Therapy Evaluation Patient Details Name: Carla Little MRN: [id number] DOB: Jul 17, 1990 Today's Date: 12/13/2020  History of Present Illness  Patient is a 30 y.o. female who presented to [facility] for intractable N/V on 9/17. ED labwork reveals e. coli UTI. Pt had recent hospital admission from 8/8-9/5 due to Lt knee dislocation which was reduced with fractures of the proximal fibula ligamentous avulsion laterally as well as medially off the medial femoral condyle and MRI showed complete ACL & PCL tears and MCL strain. Patient was placed in knee immobilizer throughout duration of admission with no knee flexion permitted. That hospital admission was complicated by nausea, vomiting, tachycardia, UTI, and fecal impaction; she was treated for nausea and vomiting with supportive care, had an endoscopy which noted a small antral ulcer on 8/27. As of 8/13 she was placed in a Lt Bledsoe brace with WBAT orders and no flexion of knee. PMH significant for morbid obesity, GERD, fatty liver disease, depression.    Clinical Impression  [name] is 30 y.o. female admitted with above HPI and diagnosis. Patient is currently limited by functional impairments below (see PT problem list). Patient lives alone prior to Lt knee dislocation and hospitalization in August was independent at baseline. At discharge from last Acute stay pt was ambulated ~50' with RW and min guard and had initiated curb negotiation training. Pt now requires Mod +2 assist for bed mobility and bed<>chair transfer. Unable to leave pt in recliner this date as no bariatric recliner had been requested during pt's 3 day stay to attempt OOB mobility. Bari chair requested via Secondary school teacher. Patient will benefit from continued skilled PT interventions to address impairments and progress independence with mobility, recommending HHPT vs Rehab stay to improve safety and independence with mobility as pt is greatly limited with functional transfers,  gait and ADL's compared to baseline. Will assess pt's progress prior to initiating rehab consult. Acute PT will follow and progress as able.        Recommendations for follow up therapy are one component of a multi-disciplinary discharge planning process, led by the attending physician.  Recommendations may be updated based on patient status, additional functional criteria and insurance authorization.  Follow Up Recommendations Home health PT (TBA, HHPT vs CIR)    Equipment Recommendations  Rolling walker with 5" wheels;3in1 (PT);Wheelchair (measurements PT);Wheelchair cushion (measurements PT)    Recommendations for Other Services       Precautions / Restrictions Precautions Precautions: Fall Precaution Comments: PT Orders from Maureen Ralphs, MD; "WBAT Left LE, only up with a walker, does not need brace at this standpoint" Restrictions Weight Bearing Restrictions: No LLE Weight Bearing: Weight bearing as tolerated Other Position/Activity Restrictions: PT Orders from Maureen Ralphs, MD; "WBAT Left LE, only up with a walker, does not need brace at this standpoint"      Mobility  Bed Mobility Overal bed mobility: Needs Assistance Bed Mobility: Supine to Sit;Sit to Supine     Supine to sit: Min assist;HOB elevated Sit to supine: Min assist   General bed mobility comments: Assist to bring Lt LE off EOB and lower to floor. Assist to raise bil LE's onto bed when returning to supine.    Transfers Overall transfer level: Needs assistance Equipment used: Rolling walker (2 wheeled) Transfers: Sit to/from [employer] Sit to Stand: Mod assist;+2 safety/equipment;+2 physical assistance Stand pivot transfers: Mod assist;Min assist;+2 safety/equipment;+2 physical assistance       General transfer comment: Mod +2 assist to rise from EOB.  pt unable to stand for more tahn 5 seconds on first stand. completed 2 additional sit<>stands with ~15 second duration of standing. pt  leaning on front bar of walker and required cues for safety. Mod +2 assist to manage walker and guide direction with stand step/pivot to recliner. Mod Assist to rise from recliner and step back to bed (not a bariatric recliner therefore pt returned to bed).  Ambulation/Gait                Stairs            Wheelchair Mobility    Modified Rankin (Stroke Patients Only)       Balance Overall balance assessment: Needs assistance Sitting-balance support: No upper extremity supported Sitting balance-Leahy Scale: Fair     Standing balance support: Bilateral upper extremity supported;During functional activity Standing balance-Leahy Scale: Poor                               Pertinent Vitals/Pain Pain Assessment: 0-10 Faces Pain Scale: Hurts little more Pain Location: Lt knee; stomach nausea, generalized Pain Descriptors / Indicators: Discomfort Pain Intervention(s): Limited activity within patient's tolerance;Monitored during session;Repositioned;Premedicated before session    Home Living Family/patient expects to be discharged to:: Private residence Living Arrangements: Alone Available Help at Discharge: Friend(s);Available PRN/intermittently Type of Home: Apartment Home Access: Level entry     Home Layout: One level Home Equipment: None Additional Comments: pt's cousin was supposed to stay ~2 weeks after she discharged from the hospital but she only stayed about 4 days due to disagreements. Pt reports cousin was trying to pull on her brace and tha the metal posts on the brace began to protrude through the fabric. She had to take it off and has just been trying to keep her knee straight. She has no other family/friends to help. Pt also reports her bed is still too high for her and she has to crawl up in bed on her stomach.    Prior Function Level of Independence: Independent;Needs assistance   Gait / Transfers Assistance Needed: pt reports she was walking  with her walker the length of her apartment about 2-3x/day.  ADL's / Homemaking Assistance Needed: pt has been doing sink baths since her last hospital stay due to fear of falling in shower/tub. she is scared she will be too fatigeud or slip standing in shower and knows she can't getup from sitting in the tub.        Hand Dominance   Dominant Hand: Right    Extremity/Trunk Assessment   Upper Extremity Assessment Upper Extremity Assessment: Overall WFL for tasks assessed    Lower Extremity Assessment Lower Extremity Assessment: LLE deficits/detail LLE Deficits / Details: pt able to assist with mobilizing Lt LE and continues to maintain knee extension LLE: Unable to fully assess due to pain LLE Sensation: WNL    Cervical / Trunk Assessment Cervical / Trunk Assessment: Normal;Other exceptions Cervical / Trunk Exceptions: body habitus  Communication   Communication: No difficulties  Cognition Arousal/Alertness: Awake/alert Behavior During Therapy: WFL for tasks assessed/performed Overall Cognitive Status: Within Functional Limits for tasks assessed                                        General Comments      Exercises     Assessment/Plan  PT Assessment Patient needs continued PT services  PT Problem List Decreased strength;Decreased knowledge of precautions;Decreased range of motion;Decreased mobility;Decreased knowledge of use of DME;Decreased activity tolerance;Decreased safety awareness;Obesity;Decreased balance       PT Treatment Interventions DME instruction;Therapeutic activities;Therapeutic exercise;Patient/family education;Functional mobility training;Wheelchair mobility training    PT Goals (Current goals can be found in the Care Plan section)  Acute Rehab PT Goals Patient Stated Goal: to get better before going home PT Goal Formulation: With patient Time For Goal Achievement: 12/27/20 Potential to Achieve Goals: Good    Frequency Min  5X/week   Barriers to discharge Decreased caregiver support      Co-evaluation               AM-PAC PT "6 Clicks" Mobility  Outcome Measure Help needed turning from your back to your side while in a flat bed without using bedrails?: A Little Help needed moving from lying on your back to sitting on the side of a flat bed without using bedrails?: A Little Help needed moving to and from a bed to a chair (including a wheelchair)?: A Lot Help needed standing up from a chair using your arms (e.g., wheelchair or bedside chair)?: A Lot Help needed to walk in hospital room?: A Lot Help needed climbing 3-5 steps with a railing? : Total 6 Click Score: 13    End of Session Equipment Utilized During Treatment: Gait belt Activity Tolerance: Patient tolerated treatment well;Patient limited by fatigue Patient left: in bed;with call bell/phone within reach;with bed alarm set Nurse Communication: Mobility status PT Visit Diagnosis: Unsteadiness on feet (R26.81);History of falling (Z91.81);Pain;Difficulty in walking, not elsewhere classified (R26.2) Pain - Right/Left: Left Pain - part of body: Knee      12/13/20 1400  PT Time Calculation  PT Start Time (ACUTE ONLY) 1415  PT Stop Time (ACUTE ONLY) 1455  PT Time Calculation (min) (ACUTE ONLY) 40 min  PT General Charges  $$ ACUTE PT VISIT 1 Visit  PT Evaluation  $PT Eval Moderate Complexity 1 Mod     Wynn Maudlin, DPT Acute Rehabilitation Services Office [phone] Pager [phone]    Anitra Lauth 12/13/2020, 7:21 PM

## 2020-12-13 NOTE — Progress Notes (Addendum)
PROGRESS NOTE    Carla Little  OIN:[id number] DOB: May 17, 1990 DOA: 12/09/2020 PCP: Patient, No Pcp Per (Inactive)    Chief Complaint  Patient presents with   Emesis   Nausea    Brief Narrative:  30 year old female with morbid obesity, nonalcoholic fatty liver disease, GERD, depression, recently hospitalized following a mechanical fall with left knee dislocation and resultant MCL strain, ACL and PCL rupture, complicated by nausea vomiting tachycardia UTI and fecal impaction, during recent hospitalization she was treated for nausea and vomiting with supportive care, had an endoscopy which noted a small antral ulcer on 8/27, gallbladder ultrasound was unremarkable, presents to the ED with multiple episodes of nausea and vomiting also has aches and pains everywhere. In the ED she was afebrile, mildly tachycardic, hypertensive, labs were notable for mildly abnormal AST ALT and bilirubin of 2, urinalysis is pending, COVID PCR was negative, WBC was normal, CT abdomen pelvis noted hepatomegaly and fatty liver disease   Assessment & Plan:   Principal Problem:   Intractable vomiting with nausea Active Problems:   Left knee dislocation   PUD (peptic ulcer disease)   Nonalcoholic steatohepatitis (NASH)   Class 3 obesity   Sinus tachycardia   Hypokalemia   GERD (gastroesophageal reflux disease)   Iron deficiency anemia due to chronic blood loss   E. coli UTI  #1 nausea/vomiting/E. coli UTI -Patient noted to have a long history and prolonged course of nausea and vomiting during recent hospitalization which improved with supportive care. -EGD done recently with small antral ulcer. -CT abdomen and pelvis with hepatomegaly, fatty liver disease, no ductal dilatation, no gallstones noted. -Patient with recent right upper quadrant ultrasound on 11/20/2020 with no gallstones.  LFTs trending back up.  -Patient still with ongoing nausea and emesis. -Patient with abnormal urinalysis, symptomatic  with dysuria, urine cultures with> 100,000 colonies of E. coli and 20,000 colonies of Proteus mirabilis and sensitive to the cephalosporins, fluoroquinolones, gentamicin, imipenem, nitrofurantoin, Bactrim.  Resistant to ampicillin.  Sensitive to Zosyn.  Proteus mirabilis pansensitive.  -We will narrow antibiotic coverage from IV Rocephin to IV Ancef.  -Continue IV fluids, supportive care.    2.  Sinus tachycardia -Likely secondary to dehydration and beta-blocker withdrawal. -Tachycardia improved with IV metoprolol and IV fluids.  3.  Hypokalemia -Likely secondary to GI losses. -Magnesium at 1.9.   -Potassium at 3.8.   -Follow,  4.  Iron deficiency anemia -With heavy menorrhagia on her cycle. -Hemoglobin stable at 8.8. -Follow H&H. -Transfusion threshold hemoglobin < 7.  5.  Morbid obesity/NAFLD -Needs weight loss and lifestyle modification.  6.  Recent left knee injuries -Supportive care. -PT/OT. -Patient seen in consultation by orthopedics Dr. Magnus Ivan who is following.  7.  GERD -Continue PPI.  8.  Transaminitis -Likely secondary to dehydration from nausea and ongoing emesis with inability for substantial oral intake. -Check an acute hepatitis panel. -Check abdominal ultrasound. -Continue IV fluids. -Repeat labs in the a.m.   DVT prophylaxis: SCDs, heavy menorrhagia on her cycle now. Code Status: Full Family Communication: Updated patient.  No family at bedside. Disposition:   Status is: Inpatient  Remains inpatient appropriate because:Inpatient level of care appropriate due to severity of illness  Dispo: The patient is from: Home              Anticipated d/c is to: Home              Patient currently is not medically stable to d/c.   Difficult to place patient No  Consultants:  Orthopedics: Dr. Magnus Ivan  Procedures:  CT angiogram abdomen and pelvis 08/08/2021 Chest x-ray 12/11/2020  Antimicrobials:  IV Rocephin 12/11/2020>>>>> 12/13/2020 IV Ancef  12/13/2020>>>>>>    Subjective: Patient sitting up at the bedside.  Stated had significant emesis and vomiting with some associated shortness of breath early on which has since improved.  Denies any chest pain.  Still with complaints of diffuse abdominal pain.  Stated has not had a bowel movement in 3 to 4 days.  Objective: Vitals:   12/13/20 0012 12/13/20 0434 12/13/20 0500 12/13/20 0554  BP: 121/78 117/81  117/81  Pulse: 89 (!) 108  (!) 108  Resp: (!) 22 20  20   Temp: 98 F (36.7 C) (!) 97.3 F (36.3 C)  98.3 F (36.8 C)  TempSrc: Oral Oral  Oral  SpO2: 100% 100%    Weight:   (!) 181.1 kg (!) 181.1 kg  Height:    5\' 4"  (1.626 m)    Intake/Output Summary (Last 24 hours) at 12/13/2020 1252 Last data filed at 12/12/2020 1400 Gross per 24 hour  Intake 180.1 ml  Output --  Net 180.1 ml    Filed Weights   12/13/20 0500 12/13/20 0554  Weight: (!) 181.1 kg (!) 181.1 kg    Examination:  General exam: NAD Respiratory system: Lungs clear to auscultation bilaterally.  No wheezes, no crackles, no rhonchi.  Decreased breath sounds in the bases.  Speaking in full sentences.  Cardiovascular system: Distant heart sound secondary to body habitus.  Regular rate rhythm no murmurs rubs or gallops.  No JVD.  No lower extremity edema. Gastrointestinal system: Abdomen is obese, soft, positive bowel sounds, diffuse tenderness to palpation.  No rebound.  No guarding.  Central nervous system: Alert and oriented. No focal neurological deficits. Extremities: Symmetric 5 x 5 power. Skin: No rashes, lesions or ulcers Psychiatry: Judgement and insight appear normal. Mood & affect appropriate.     Data Reviewed: I have personally reviewed following labs and imaging studies  CBC: Recent Labs  Lab 12/09/20 1552 12/10/20 0421 12/11/20 0603 12/12/20 0623 12/13/20 0523  WBC 8.2 7.9 6.3 6.2 4.8  NEUTROABS 5.7  --   --   --  2.2  HGB 10.9* 10.4* 9.2* 8.9* 8.8*  HCT 35.9* 34.1* 30.0* 29.3* 28.6*   MCV 97.0 98.8 98.4 98.7 99.3  PLT 395 341 297 284 269     Basic Metabolic Panel: Recent Labs  Lab 12/09/20 1552 12/10/20 0421 12/11/20 0603 12/12/20 0623 12/13/20 0523  NA 138 143 141 144 140  K 3.3* 4.5 3.7 4.1 3.8  CL 101 107 107 110 105  CO2 25 24 27 25 28   GLUCOSE 99 88 107* 95 92  BUN 7 7 8 8 6   CREATININE 0.64 0.44 0.56 0.53 0.49  CALCIUM 9.3 9.2 8.5* 8.8* 8.5*  MG  --   --   --  1.9 1.9     GFR: Estimated Creatinine Clearance: 169.4 mL/min (by C-G formula based on SCr of 0.49 mg/dL).  Liver Function Tests: Recent Labs  Lab 12/09/20 1552 12/10/20 0421 12/11/20 0603 12/13/20 0523  AST 158* 117* 86* 130*  ALT 66* 56* 45* 51*  ALKPHOS 73 62 54 50  BILITOT 2.0* 2.3* 2.0* 1.6*  PROT 8.8* 8.3* 7.4 7.1  ALBUMIN 3.7 3.4* 3.1* 3.0*     CBG: Recent Labs  Lab 12/10/20 0832 12/13/20 1104  GLUCAP 93 96      Recent Results (from the past 240 hour(s))  Resp  Panel by RT-PCR (Flu A&B, Covid) Nasopharyngeal Swab     Status: None   Collection Time: 12/09/20  8:10 PM   Specimen: Nasopharyngeal Swab; Nasopharyngeal(NP) swabs in vial transport medium  Result Value Ref Range Status   SARS Coronavirus 2 by RT PCR NEGATIVE NEGATIVE Final    Comment: (NOTE) SARS-CoV-2 target nucleic acids are NOT DETECTED.  The SARS-CoV-2 RNA is generally detectable in upper respiratory specimens during the acute phase of infection. The lowest concentration of SARS-CoV-2 viral copies this assay can detect is 138 copies/mL. A negative result does not preclude SARS-Cov-2 infection and should not be used as the sole basis for treatment or other patient management decisions. A negative result may occur with  improper specimen collection/handling, submission of specimen other than nasopharyngeal swab, presence of viral mutation(s) within the areas targeted by this assay, and inadequate number of viral copies(<138 copies/mL). A negative result must be combined with clinical  observations, patient history, and epidemiological information. The expected result is Negative.  Fact Sheet for Patients:  BloggerCourse.com  Fact Sheet for Healthcare Providers:  SeriousBroker.it  This test is no t yet approved or cleared by the [place] FDA and  has been authorized for detection and/or diagnosis of SARS-CoV-2 by FDA under an Emergency Use Authorization (EUA). This EUA will remain  in effect (meaning this test can be used) for the duration of the COVID-19 declaration under Section 564(b)(1) of the Act, 21 U.S.C.section 360bbb-3(b)(1), unless the authorization is terminated  or revoked sooner.       Influenza A by PCR NEGATIVE NEGATIVE Final   Influenza B by PCR NEGATIVE NEGATIVE Final    Comment: (NOTE) The Xpert Xpress SARS-CoV-2/FLU/RSV plus assay is intended as an aid in the diagnosis of influenza from Nasopharyngeal swab specimens and should not be used as a sole basis for treatment. Nasal washings and aspirates are unacceptable for Xpert Xpress SARS-CoV-2/FLU/RSV testing.  Fact Sheet for Patients: BloggerCourse.com  Fact Sheet for Healthcare Providers: SeriousBroker.it  This test is not yet approved or cleared by the [place] FDA and has been authorized for detection and/or diagnosis of SARS-CoV-2 by FDA under an Emergency Use Authorization (EUA). This EUA will remain in effect (meaning this test can be used) for the duration of the COVID-19 declaration under Section 564(b)(1) of the Act, 21 U.S.C. section 360bbb-3(b)(1), unless the authorization is terminated or revoked.  Performed at [facility], [address], [place], [place] [address]   Urine Culture     Status: Abnormal   Collection Time: 12/11/20 12:37 AM   Specimen: Urine, Random  Result Value Ref Range Status   Specimen Description   Final    URINE,  RANDOM Performed at [facility], [address], [place], [place] [address]    Special Requests   Final    NONE Performed at [facility], [address], [place], [place] [address]    Culture (A)  Final    >=100,000 COLONIES/mL ESCHERICHIA COLI 20,000 COLONIES/mL PROTEUS MIRABILIS    Report Status 12/13/2020 FINAL  Final   Organism ID, Bacteria ESCHERICHIA COLI (A)  Final   Organism ID, Bacteria PROTEUS MIRABILIS (A)  Final      Susceptibility   Escherichia coli - MIC*    AMPICILLIN >=32 RESISTANT Resistant     CEFAZOLIN <=4 SENSITIVE Sensitive     CEFEPIME <=0.12 SENSITIVE Sensitive     CEFTRIAXONE <=0.25 SENSITIVE Sensitive     CIPROFLOXACIN <=0.25 SENSITIVE Sensitive  GENTAMICIN <=1 SENSITIVE Sensitive     IMIPENEM <=0.25 SENSITIVE Sensitive     NITROFURANTOIN 32 SENSITIVE Sensitive     TRIMETH/SULFA <=20 SENSITIVE Sensitive     AMPICILLIN/SULBACTAM 16 INTERMEDIATE Intermediate     PIP/TAZO <=4 SENSITIVE Sensitive     * >=100,000 COLONIES/mL ESCHERICHIA COLI   Proteus mirabilis - MIC*    AMPICILLIN <=2 SENSITIVE Sensitive     CEFAZOLIN <=4 SENSITIVE Sensitive     CEFEPIME <=0.12 SENSITIVE Sensitive     CEFTRIAXONE <=0.25 SENSITIVE Sensitive     CIPROFLOXACIN <=0.25 SENSITIVE Sensitive     GENTAMICIN <=1 SENSITIVE Sensitive     IMIPENEM 2 SENSITIVE Sensitive     NITROFURANTOIN RESISTANT Resistant     TRIMETH/SULFA <=20 SENSITIVE Sensitive     AMPICILLIN/SULBACTAM <=2 SENSITIVE Sensitive     PIP/TAZO <=4 SENSITIVE Sensitive     * 20,000 COLONIES/mL PROTEUS MIRABILIS          Radiology Studies: DG Chest 1 View  Result Date: 12/11/2020 CLINICAL DATA:  Shortness of breath EXAM: CHEST  1 VIEW COMPARISON:  05/16/2018. FINDINGS: Evaluation is limited by body habitus and positioning. Low lung volumes. The heart is likely normal in size, given projection. No focal pulmonary opacity. No definite pleural effusion or pneumothorax. No  acute osseous abnormality. IMPRESSION: Evaluation is limited by body habitus, positioning, and low lung volumes. Within this limitation, no acute cardiopulmonary process. Electronically Signed   By: Wiliam Ke M.D.   On: 12/11/2020 21:48   DG Knee Left Port  Result Date: 12/13/2020 CLINICAL DATA:  History of dislocation of knee. EXAM: PORTABLE LEFT KNEE - 1-2 VIEW COMPARISON:  Most recent radiograph 11/21/2020 FINDINGS: Proximal fibular fracture is less well-defined on the current exam, unclear if this is related to differences in technique or interval healing. There is a small fracture fragment projects lateral to the lateral tibial plateau. Fracture fragment arising from the medial femoral condyle is faintly visualized. Narrowing of the medial and widening of the lateral tibiofemoral compartments. Patellofemoral relationship is not well-defined on the current exam due to habitus and positioning. IMPRESSION: 1. Proximal fibular fracture is less well-defined on the current exam, unclear if this is related to differences in technique or interval healing. Small fracture fragment projects lateral to the lateral tibial plateau. 2. Medial femoral condyle fracture fragment is faintly visualized. 3. Narrowing of the medial and widening of the lateral tibiofemoral compartments, possibly due to ligamentous injury. Electronically Signed   By: Narda Rutherford M.D.   On: 12/13/2020 10:36        Scheduled Meds:  ciprofloxacin  2 drop Right Eye Q4H while awake   metoprolol tartrate  7.5 mg Intravenous Q6H   pantoprazole (PROTONIX) IV  40 mg Intravenous Q12H   senna-docusate  1 tablet Oral BID   Continuous Infusions:  0.9 % NaCl with KCl 20 mEq / L 100 mL/hr at 12/13/20 0008    ceFAZolin (ANCEF) IV 2 g (12/13/20 1113)   ondansetron (ZOFRAN) IV 8 mg (12/12/20 1834)   promethazine (PHENERGAN) injection (IM or IVPB) 12.5 mg (12/13/20 1208)     LOS: 3 days    Time spent: 35 minutes.    Ramiro Harvest, MD [facility]   To contact the attending provider between 7A-7P or the covering provider during after hours 7P-7A, please log into the web site [email or web address] and access using universal [place] password for that web site. If you do not have the password, please call the hospital operator.  12/13/2020, 12:52 PM

## 2020-12-13 NOTE — Progress Notes (Signed)
Patient ID: Carla Little, female   DOB: Oct 02, 1990, 30 y.o.   MRN: [id number] I did come by and see the patient yesterday late afternoon after finding out that she was here in the hospital.  She is now 6 weeks status post a left knee dislocation.  She is now not in any type of brace or knee immobilizer.  It has been hard to get that device to fit on her leg due to her morbid obesity.  At this point, she can probably be without the brace as long as she is being very careful with her knee and up with a walker.  I will reorder physical therapy for mobility purposes.  I will order a new left knee x-ray as well.

## 2020-12-13 NOTE — Progress Notes (Signed)
Patient ID: Carla Little, female   DOB: 03/24/1991, 30 y.o.   MRN: [id number] I did review the patient's left knee x-rays.  Fortunately her knee is still located.  There is more widening laterally to suggest some incompetence of collateral ligaments.  However this may be positional due to her body habitus.  If there are issues with her mobility, I would have her get her knee brace back on and continue bracing for another 2 to 4 weeks.

## 2020-12-14 DIAGNOSIS — E669 Obesity, unspecified: Secondary | ICD-10-CM

## 2020-12-14 LAB — COMPREHENSIVE METABOLIC PANEL
ALT: 53 U/L — ABNORMAL HIGH (ref 0–44)
AST: 135 U/L — ABNORMAL HIGH (ref 15–41)
Albumin: 3.1 g/dL — ABNORMAL LOW (ref 3.5–5.0)
Alkaline Phosphatase: 58 U/L (ref 38–126)
Anion gap: 11 (ref 5–15)
BUN: <5 mg/dL — ABNORMAL LOW (ref 6–20)
CO2: 26 mmol/L (ref 22–32)
Calcium: 8.7 mg/dL — ABNORMAL LOW (ref 8.9–10.3)
Chloride: 105 mmol/L (ref 98–111)
Creatinine, Ser: 0.45 mg/dL (ref 0.44–1.00)
GFR, Estimated: >60 mL/min (ref >60)
Glucose, Bld: 131 mg/dL — ABNORMAL HIGH (ref 70–99)
Potassium: 3.6 mmol/L (ref 3.5–5.1)
Sodium: 142 mmol/L (ref 135–145)
Total Bilirubin: 1.7 mg/dL — ABNORMAL HIGH (ref 0.3–1.2)
Total Protein: 7.4 g/dL (ref 6.5–8.1)

## 2020-12-14 LAB — CBC WITH DIFFERENTIAL/PLATELET
Abs Immature Granulocytes: 0.11 10*3/uL — ABNORMAL HIGH (ref 0.00–0.07)
Basophils Absolute: 0 10*3/uL (ref 0.0–0.1)
Basophils Relative: 0 %
Eosinophils Absolute: 0 10*3/uL (ref 0.0–0.5)
Eosinophils Relative: 0 %
HCT: 30.9 % — ABNORMAL LOW (ref 36.0–46.0)
Hemoglobin: 9.6 g/dL — ABNORMAL LOW (ref 12.0–15.0)
Immature Granulocytes: 2 %
Lymphocytes Relative: 38 %
Lymphs Abs: 1.8 10*3/uL (ref 0.7–4.0)
MCH: 31.2 pg (ref 26.0–34.0)
MCHC: 31.1 g/dL (ref 30.0–36.0)
MCV: 100.3 fL — ABNORMAL HIGH (ref 80.0–100.0)
Monocytes Absolute: 0.4 10*3/uL (ref 0.1–1.0)
Monocytes Relative: 8 %
Neutro Abs: 2.6 10*3/uL (ref 1.7–7.7)
Neutrophils Relative %: 52 %
Platelets: 254 10*3/uL (ref 150–400)
RBC: 3.08 MIL/uL — ABNORMAL LOW (ref 3.87–5.11)
WBC: 4.9 10*3/uL (ref 4.0–10.5)
nRBC: 0.4 % — ABNORMAL HIGH (ref 0.0–0.2)

## 2020-12-14 LAB — MAGNESIUM: Magnesium: 2 mg/dL (ref 1.7–2.4)

## 2020-12-14 LAB — PHOSPHORUS: Phosphorus: 2.9 mg/dL (ref 2.5–4.6)

## 2020-12-14 MED ORDER — PHENOL 1.4 % MT LIQD
1.0000 | OROMUCOSAL | Status: DC | PRN
  Filled 2020-12-14: qty 177

## 2020-12-14 MED ORDER — METOCLOPRAMIDE HCL 5 MG/ML IJ SOLN
10.0000 mg | Freq: Three times a day (TID) | INTRAMUSCULAR | Status: DC
  Administered 2020-12-14 – 2020-12-16 (×6): 10 mg via INTRAVENOUS
  Filled 2020-12-14 (×7): qty 2

## 2020-12-14 NOTE — Progress Notes (Signed)
Physical Therapy Treatment Patient Details Name: Carla Little MRN: [id number] DOB: [birth year] Today's Date: 12/14/2020   History of Present Illness Patient is a 30 y.o. female who presented to [facility] for intractable N/V on 9/17. ED labwork reveals e. coli UTI. Pt had recent hospital admission from 8/8-9/5 due to Lt knee dislocation which was reduced with fractures of the proximal fibula ligamentous avulsion laterally as well as medially off the medial femoral condyle and MRI showed complete ACL & PCL tears and MCL strain. Patient was placed in knee immobilizer throughout duration of admission with no knee flexion permitted. That hospital admission was complicated by nausea, vomiting, tachycardia, UTI, and fecal impaction; she was treated for nausea and vomiting with supportive care, had an endoscopy which noted a small antral ulcer on 8/27. As of 8/13 she was placed in a Lt Bledsoe brace with WBAT orders and no flexion of knee. PMH significant for morbid obesity, GERD, fatty liver disease, depression.    PT Comments    Min A of 2 for sit to stand and to perform stand pivot transfer x 2 for toileting. LLE buckled in standing. Noted Dr. Eliberto Ivory note stating pt isn't required to wear a knee brace but may do so if helpful. Pt's brace is at home, requested she have someone bring it in. Pt fatigues very quickly with minimal mobility. She reports she's not been able to keep down any food for at least a week so feels weak. Pt is not safe to DC home at her current functional level, and is sounds like she was not able to care for herself at home alone prior to this admission (she was crawling in/out of her bed, could not don/doff her brace independently, does not have assistance available). CIR vs SNF recommended. No bariatric recliner in room, nurse secretary stated portable equipment stated there are currently none available in the hospital.      Recommendations for follow up therapy are one component of  a multi-disciplinary discharge planning process, led by the attending physician.  Recommendations may be updated based on patient status, additional functional criteria and insurance authorization.  Follow Up Recommendations  CIR;SNF;Supervision for mobility/OOB (CIR vs SNF)     Equipment Recommendations  Rolling walker with 5" wheels;3in1 (PT);Wheelchair (measurements PT);Wheelchair cushion (measurements PT)    Recommendations for Other Services Rehab consult     Precautions / Restrictions Precautions Precautions: Fall Precaution Comments: PT Orders from Maureen Ralphs, MD; "WBAT Left LE, only up with a walker, does not need brace at this standpoint" Restrictions Weight Bearing Restrictions: No Other Position/Activity Restrictions: PT Orders from Maureen Ralphs, MD; "WBAT Left LE, only up with a walker, does not need brace at this standpoint"     Mobility  Bed Mobility Overal bed mobility: Needs Assistance Bed Mobility: Supine to Sit;Sit to Supine Rolling: Supervision   Supine to sit: Supervision;HOB elevated Sit to supine: Supervision   General bed mobility comments: used rail, increased effort    Transfers Overall transfer level: Needs assistance Equipment used: Rolling walker (2 wheeled) Transfers: Sit to/from [employer] Sit to Stand: +2 safety/equipment;+2 physical assistance;Min assist Stand pivot transfers: Mod assist;Min assist;+2 safety/equipment;+2 physical assistance       General transfer comment: Min +2 assist to rise from EOB, pt pivoted to Bedside commode with bariRW with LLE buckling, VCs for heavy use of BUEs. Pt reports her L knee feels unstable without a KI. Note from Dr Magnus Ivan today states pt doesn't have to wear one but  may if she prefers to. Her brace is at home, requested she ask someone to bring it in as it was custom made by PT during last hospitalization. Pt fatigues very quickly with minimal activity.  Ambulation/Gait                  Stairs             Wheelchair Mobility    Modified Rankin (Stroke Patients Only)       Balance Overall balance assessment: Needs assistance Sitting-balance support: No upper extremity supported Sitting balance-Leahy Scale: Fair     Standing balance support: Bilateral upper extremity supported;During functional activity Standing balance-Leahy Scale: Poor Standing balance comment: heavy reliance upon BUE support                            Cognition Arousal/Alertness: Awake/alert Behavior During Therapy: WFL for tasks assessed/performed Overall Cognitive Status: Within Functional Limits for tasks assessed                                        Exercises      General Comments        Pertinent Vitals/Pain      Home Living                      Prior Function            PT Goals (current goals can now be found in the care plan section) Acute Rehab PT Goals Patient Stated Goal: to get better before going home PT Goal Formulation: With patient Time For Goal Achievement: 12/27/20 Potential to Achieve Goals: Good Progress towards PT goals: Progressing toward goals    Frequency    Min 5X/week      PT Plan Frequency needs to be updated;Discharge plan needs to be updated    Co-evaluation PT/OT/SLP Co-Evaluation/Treatment: Yes Reason for Co-Treatment: Complexity of the patient's impairments (multi-system involvement);For patient/therapist safety;To address functional/ADL transfers PT goals addressed during session: Mobility/safety with mobility;Balance;Proper use of DME        AM-PAC PT "6 Clicks" Mobility   Outcome Measure  Help needed turning from your back to your side while in a flat bed without using bedrails?: A Little Help needed moving from lying on your back to sitting on the side of a flat bed without using bedrails?: A Little Help needed moving to and from a bed to a chair (including a  wheelchair)?: Total Help needed standing up from a chair using your arms (e.g., wheelchair or bedside chair)?: A Lot Help needed to walk in hospital room?: Total Help needed climbing 3-5 steps with a railing? : Total 6 Click Score: 11    End of Session Equipment Utilized During Treatment: Gait belt Activity Tolerance: Patient tolerated treatment well;Patient limited by fatigue Patient left: in bed;with call bell/phone within reach;with bed alarm set Nurse Communication: Mobility status PT Visit Diagnosis: Unsteadiness on feet (R26.81);History of falling (Z91.81);Pain;Difficulty in walking, not elsewhere classified (R26.2) Pain - Right/Left: Left Pain - part of body: Knee     Time: 2355-7322 PT Time Calculation (min) (ACUTE ONLY): 41 min  Charges:  $Therapeutic Activity: 23-37 mins                    Ralene Bathe Kistler PT 12/14/2020  Acute Rehabilitation Services Pager [phone]  Office [phone]

## 2020-12-14 NOTE — Evaluation (Signed)
Occupational Therapy Evaluation Patient Details Name: Carla Little MRN: [id number] DOB: 1991-01-20 Today's Date: 12/14/2020   History of Present Illness Patient is a 30 y.o. female who presented to [facility] for intractable N/V on 9/17. ED labwork reveals e. coli UTI. Pt had recent hospital admission from 8/8-9/5 due to Lt knee dislocation which was reduced with fractures of the proximal fibula ligamentous avulsion laterally as well as medially off the medial femoral condyle and MRI showed complete ACL & PCL tears and MCL strain. Patient was placed in knee immobilizer throughout duration of admission with no knee flexion permitted. That hospital admission was complicated by nausea, vomiting, tachycardia, UTI, and fecal impaction; she was treated for nausea and vomiting with supportive care, had an endoscopy which noted a small antral ulcer on 8/27. As of 8/13 she was placed in a Lt Bledsoe brace with WBAT orders and no flexion of knee. PMH significant for morbid obesity, GERD, fatty liver disease, depression.   Clinical Impression   [name] is a 30 year old woman who presents with above medical history. On evaluation patient continues to have strength and ROM deficits of LLE, generalized weakness, decreased activity tolerance, impaired balance and pain. Patient reports she was doing okay getting around her apartment but lost strength once she couldn't keep anything down. Patient had assistance from a cousin approximately 5 days before they had a falling out. Patient currently supervision for bed mobility with heavy reliance on bed rails, min assist and +2 for safety to transfer to [facility] only. Patient total assist for toileting and lower body dressing and otherwise set up assist at bed level. Patient reports feeling weak and wobbly due to lack of nutrition. Patient will benefit from skilled OT services while in hospital to improve deficits and learn compensatory strategies as needed in order to be  as functional as possible. Therapist is familiar with patient from prior admission and patient will be a difficult discharge as she has no significant assistance at discharge and no insurance. Current recommendation is SNF due to her significant decline though it may not be feasible.      Recommendations for follow up therapy are one component of a multi-disciplinary discharge planning process, led by the attending physician.  Recommendations may be updated based on patient status, additional functional criteria and insurance authorization.   Follow Up Recommendations  SNF    Equipment Recommendations  None recommended by OT    Recommendations for Other Services       Precautions / Restrictions Precautions Precautions: Fall Precaution Comments: PT Orders from Maureen Ralphs, MD; "WBAT Left LE, only up with a walker, does not need brace at this standpoint" Restrictions Weight Bearing Restrictions: No Other Position/Activity Restrictions: PT Orders from Maureen Ralphs, MD; "WBAT Left LE, only up with a walker, does not need brace at this standpoint"      Mobility Bed Mobility Overal bed mobility: Needs Assistance Bed Mobility: Supine to Sit;Sit to Supine Rolling: Supervision   Supine to sit: Supervision;HOB elevated Sit to supine: Supervision   General bed mobility comments: used rail, increased effort, difficulty with bringing LLE to bed    Transfers Overall transfer level: Needs assistance Equipment used: Rolling walker (2 wheeled) Transfers: Sit to/from [employer] Sit to Stand: +2 safety/equipment;+2 physical assistance;Min assist Stand pivot transfers: Mod assist;Min assist;+2 safety/equipment;+2 physical assistance       General transfer comment: Min +2 assist to rise from EOB, pt pivoted to Bedside commode with bariRW with LLE  buckling, VCs for heavy use of BUEs. Pt reports her L knee feels unstable without a KI. Note from Dr Magnus Ivan today states pt doesn't  have to wear one but may if she prefers to. Her brace is at home, requested she ask someone to bring it in as it was custom made by PT during last hospitalization. Pt fatigues very quickly with minimal activity.    Balance Overall balance assessment: Needs assistance Sitting-balance support: No upper extremity supported Sitting balance-Leahy Scale: Fair     Standing balance support: Bilateral upper extremity supported;During functional activity Standing balance-Leahy Scale: Poor Standing balance comment: heavy reliance upon BUE support                           ADL either performed or assessed with clinical judgement   ADL Overall ADL's : Needs assistance/impaired Eating/Feeding: Independent;Bed level   Grooming: Independent;Bed level   Upper Body Bathing: Minimal assistance;Sitting;Bed level   Lower Body Bathing: Maximal assistance;Bed level;Sitting/lateral leans   Upper Body Dressing : Sitting;Bed level;Set up   Lower Body Dressing: Total assistance;Sitting/lateral leans;Bed level   Toilet Transfer: BSC;Minimal assistance;RW;+2 for safety/equipment   Toileting- Clothing Manipulation and Hygiene: Total assistance;Sit to/from stand;+2 for safety/equipment Toileting - Clothing Manipulation Details (indicate cue type and reason): 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 Also limited by body habitus             Vision Patient Visual Report: No change from baseline       Perception     Praxis      Pertinent Vitals/Pain Pain Assessment: 0-10 Pain Score: 9  Pain Location: LLE Pain Descriptors / Indicators: Discomfort;Grimacing;Guarding Pain Intervention(s): Premedicated before session;Limited activity within patient's tolerance     Hand Dominance Right   Extremity/Trunk Assessment Upper Extremity Assessment Upper Extremity Assessment: Overall WFL for tasks assessed   Lower  Extremity Assessment Lower Extremity Assessment: Defer to PT evaluation   Cervical / Trunk Assessment Cervical / Trunk Assessment: Other exceptions Cervical / Trunk Exceptions: body habitus   Communication Communication Communication: No difficulties   Cognition Arousal/Alertness: Awake/alert Behavior During Therapy: WFL for tasks assessed/performed Overall Cognitive Status: Within Functional Limits for tasks assessed                                     General Comments       Exercises     Shoulder Instructions      Home Living Family/patient expects to be discharged to:: Private residence Living Arrangements: Alone Available Help at Discharge: Friend(s);Available PRN/intermittently Type of Home: Apartment Home Access: Level entry     Home Layout: One level     Bathroom Shower/Tub: Chief Strategy Officer: Standard Bathroom Accessibility: Yes   Home Equipment: Bedside commode;Walker - 2 wheels;Adaptive equipment Adaptive Equipment: Sock aid Additional Comments: pt's cousin was supposed to stay ~2 weeks after she discharged from the hospital but she only stayed about 4 days due to disagreements. Pt reports cousin was trying to pull on her brace and tha the metal posts on the brace began to protrude through the fabric. She had to take it off and has just been trying to keep her knee straight. She has no other family/friends to help. Pt also reports her bed is still too high for her and  she has to crawl up in bed on her stomach.      Prior Functioning/Environment Level of Independence: Needs assistance  Gait / Transfers Assistance Needed: pt reports she was walking with her walker the length of her apartment about 2-3x/day. ADL's / Homemaking Assistance Needed: pt has been doing sink baths since her last hospital stay due to fear of falling in shower/tub. she is scared she will be too fatigeud or slip standing in shower and knows she can't getup  from sitting in the tub. Reports predominantly getting to [facility] only            OT Problem List: Decreased strength;Decreased activity tolerance;Impaired balance (sitting and/or standing);Decreased safety awareness;Decreased knowledge of use of DME or AE;Decreased knowledge of precautions;Pain;Obesity;Decreased range of motion      OT Treatment/Interventions: Self-care/ADL training;Therapeutic exercise;DME and/or AE instruction;Therapeutic activities;Patient/family education;Balance training    OT Goals(Current goals can be found in the care plan section) Acute Rehab OT Goals Patient Stated Goal: to get stronger OT Goal Formulation: With patient Time For Goal Achievement: 12/28/20 Potential to Achieve Goals: Good  OT Frequency: Min 2X/week   Barriers to D/C:            Co-evaluation PT/OT/SLP Co-Evaluation/Treatment: Yes Reason for Co-Treatment: For patient/therapist safety;To address functional/ADL transfers PT goals addressed during session: Mobility/safety with mobility;Balance;Proper use of DME        AM-PAC OT "6 Clicks" Daily Activity     Outcome Measure Help from another person eating meals?: None Help from another person taking care of personal grooming?: None Help from another person toileting, which includes using toliet, bedpan, or urinal?: Total Help from another person bathing (including washing, rinsing, drying)?: A Lot Help from another person to put on and taking off regular upper body clothing?: None Help from another person to put on and taking off regular lower body clothing?: Total 6 Click Score: 16   End of Session Equipment Utilized During Treatment: Rolling walker Nurse Communication: Mobility status  Activity Tolerance: Patient limited by pain;Patient limited by fatigue Patient left: in bed;with call bell/phone within reach  OT Visit Diagnosis: Other abnormalities of gait and mobility (R26.89);History of falling (Z91.81);Pain;Muscle weakness  (generalized) (M62.81)                Time: 6433-2951 OT Time Calculation (min): 40 min Charges:  OT General Charges $OT Visit: 1 Visit OT Evaluation $OT Eval Moderate Complexity: 1 Mod  Kaly Mcquary, OTR/L Acute Care Rehab Services  Office [phone] Pager: [phone]   Kelli Churn 12/14/2020, 1:19 PM

## 2020-12-14 NOTE — Progress Notes (Signed)
[facility]                                                                              Patient Demographics  Carla Little, is a 30 y.o. female, DOB - [birth year], PXT:[id number]  Admit date - 12/09/2020   Admitting Physician No admitting provider for patient encounter.  Outpatient Primary MD for the patient is Patient, No Pcp Per (Inactive)  Outpatient specialists:   LOS - 4  days   Medical records reviewed and are as summarized below:    Chief Complaint  Patient presents with   Emesis   Nausea       Brief summary   Patient is a 30 year old female with morbid obesity, nonalcoholic fatty liver disease, GERD, depression, recently was hospitalized following a mechanical fall with a left knee dislocation, resultant MCL strain, ACL and PCL rupture then complicated by nausea vomiting, tachycardia, UTI and fecal impaction during the hospitalization. Patient had an endoscopy which noted small antral ulcer on 8/27, RUQ ultrasound was unremarkable.  Patient presented to ED with multiple episodes of nausea, vomiting, aches and pains everywhere. In ED, afebrile, mildly tachycardic, hypertensive.  Labs showed mildly abnormal AST, ALT, bilirubin of 2.  Assessment & Plan    Principal Problem:   Intractable vomiting with nausea -Patient noted to have prolonged course of nausea and vomiting during recent hospitalization -EGD had shown small antral ulcer.  CT abdomen and pelvis with hepatomegaly fatty liver disease, no ductal dilatation or gallstones. -RUQ ultrasound 8/28 showed no gallstones. - Continue IV fluids - placed on IV Reglan 10 mg every 8 hours, will advance diet to full liquids. -Follow LFTs, if trending up, will need MRCP or HIDA scan for further work-up.   -RUQ [place] 9/20 showed fatty infiltration of liver and gallbladder sludge  E. coli UTI -Urine culture showed more than 100,000 colonies of E. coli and 20,000 colonies of Proteus  mirabilis -Sensitivities reviewed, continue IV Ancef  Recent left knee dislocation -Followed by orthopedics, appreciate recommendations by Dr. Magnus Ivan  -Recommended continue brace for another 2 to 4 weeks  -PT OT evaluation, recommended SNF  Sinus tachycardia -Currently on IV Lopressor  Hypothyroidism -TSH 11.8 on 11/07/2020, T4 0.9 -Repeat TSH, free T4, T3, will place on Synthroid if TSH worsening  GERD -Continue PPI  Iron deficiency anemia -History of heavy menorrhagia on her cycle, H&H currently stable  Morbid obesity Estimated body mass index is 73.38 kg/m as calculated from the following:   Height as of this encounter: 5\' 4"  (1.626 m).   Weight as of this encounter: 193.9 kg.  Code Status: Full DVT Prophylaxis:  SCDs Start: 12/09/20 1937   Level of Care: Level of care: Telemetry Family Communication: Discussed all imaging results, lab results, explained to the patient   Disposition Plan:     Status is: Inpatient  Remains inpatient appropriate because:Inpatient level of care appropriate due to severity of illness  Dispo: The patient is from: Home              Anticipated d/c is to: SNF              Patient  currently is not medically stable to d/c.   Difficult to place patient No      Time Spent in minutes 35 minutes  Procedures:  None  Consultants:   None  Antimicrobials:   Anti-infectives (From admission, onward)    Start     Dose/Rate Route Frequency Ordered Stop   12/13/20 2200  metroNIDAZOLE (FLAGYL) IVPB 500 mg        500 mg 100 mL/hr over 60 Minutes Intravenous Every 8 hours 12/13/20 2107     12/13/20 1000  ceFAZolin (ANCEF) IVPB 2g/100 mL premix        2 g 200 mL/hr over 30 Minutes Intravenous Every 8 hours 12/13/20 0927     12/12/20 1000  cefTRIAXone (ROCEPHIN) 2 g in sodium chloride 0.9 % 100 mL IVPB  Status:  Discontinued        2 g 200 mL/hr over 30 Minutes Intravenous Every 24 hours 12/12/20 0901 12/13/20 0928   12/11/20 0845   cefTRIAXone (ROCEPHIN) 1 g in sodium chloride 0.9 % 100 mL IVPB  Status:  Discontinued        1 g 200 mL/hr over 30 Minutes Intravenous Every 24 hours 12/11/20 0758 12/12/20 0901          Medications  Scheduled Meds:  bisacodyl  10 mg Rectal Once   ciprofloxacin  2 drop Right Eye Q4H while awake   metoprolol tartrate  10 mg Intravenous Q6H   pantoprazole (PROTONIX) IV  40 mg Intravenous Q12H   prochlorperazine  10 mg Intravenous Q8H   senna-docusate  1 tablet Oral BID   simethicone  160 mg Oral QID   Continuous Infusions:  0.9 % NaCl with KCl 20 mEq / L 125 mL/hr at 12/14/20 0338    ceFAZolin (ANCEF) IV 2 g (12/14/20 1041)   metronidazole 500 mg (12/14/20 1358)   ondansetron (ZOFRAN) IV 8 mg (12/14/20 0931)   PRN Meds:.acetaminophen **OR** acetaminophen, HYDROmorphone (DILAUDID) injection, menthol-cetylpyridinium, ondansetron **OR** ondansetron (ZOFRAN) IV      Subjective:   Carla Little was seen and examined today.  States still has nausea, not feeling too well.  Has mild diffuse abdominal pain, no fevers.  No overt vomiting at the time of my examination.  No dizziness, chest pain or shortness of breath.    Objective:   Vitals:   12/14/20 0130 12/14/20 0332 12/14/20 0500 12/14/20 1418  BP: 136/77 115/83  120/74  Pulse: (!) 102 100  (!) 101  Resp: 20 20  16   Temp:  98.4 F (36.9 C)  98.8 F (37.1 C)  TempSrc:  Axillary  Oral  SpO2: 100% 100%  95%  Weight:   (!) 193.9 kg   Height:        Intake/Output Summary (Last 24 hours) at 12/14/2020 1552 Last data filed at 12/14/2020 12/16/2020 Gross per 24 hour  Intake 4013 ml  Output --  Net 4013 ml     Wt Readings from Last 3 Encounters:  12/14/20 (!) 193.9 kg  11/01/20 (!) 181.4 kg  10/27/13 (!) 161.9 kg     Exam General: Alert and oriented x 3, NAD Cardiovascular: S1 S2 auscultated, no murmurs, RRR Respiratory: Clear to auscultation bilaterally Gastrointestinal: Obese, soft, mild diffuse TTP  + bowel  sounds Ext: no pedal edema bilaterally Neuro: no new deficits Psych: Normal affect and demeanor, alert and oriented x3    Data Reviewed:  I have personally reviewed following labs and imaging studies  Micro Results Recent Results (from the past  240 hour(s))  Resp Panel by RT-PCR (Flu A&B, Covid) Nasopharyngeal Swab     Status: None   Collection Time: 12/09/20  8:10 PM   Specimen: Nasopharyngeal Swab; Nasopharyngeal(NP) swabs in vial transport medium  Result Value Ref Range Status   SARS Coronavirus 2 by RT PCR NEGATIVE NEGATIVE Final    Comment: (NOTE) SARS-CoV-2 target nucleic acids are NOT DETECTED.  The SARS-CoV-2 RNA is generally detectable in upper respiratory specimens during the acute phase of infection. The lowest concentration of SARS-CoV-2 viral copies this assay can detect is 138 copies/mL. A negative result does not preclude SARS-Cov-2 infection and should not be used as the sole basis for treatment or other patient management decisions. A negative result may occur with  improper specimen collection/handling, submission of specimen other than nasopharyngeal swab, presence of viral mutation(s) within the areas targeted by this assay, and inadequate number of viral copies(<138 copies/mL). A negative result must be combined with clinical observations, patient history, and epidemiological information. The expected result is Negative.  Fact Sheet for Patients:  BloggerCourse.com  Fact Sheet for Healthcare Providers:  SeriousBroker.it  This test is no t yet approved or cleared by the [place] FDA and  has been authorized for detection and/or diagnosis of SARS-CoV-2 by FDA under an Emergency Use Authorization (EUA). This EUA will remain  in effect (meaning this test can be used) for the duration of the COVID-19 declaration under Section 564(b)(1) of the Act, 21 U.S.C.section 360bbb-3(b)(1), unless the authorization  is terminated  or revoked sooner.       Influenza A by PCR NEGATIVE NEGATIVE Final   Influenza B by PCR NEGATIVE NEGATIVE Final    Comment: (NOTE) The Xpert Xpress SARS-CoV-2/FLU/RSV plus assay is intended as an aid in the diagnosis of influenza from Nasopharyngeal swab specimens and should not be used as a sole basis for treatment. Nasal washings and aspirates are unacceptable for Xpert Xpress SARS-CoV-2/FLU/RSV testing.  Fact Sheet for Patients: BloggerCourse.com  Fact Sheet for Healthcare Providers: SeriousBroker.it  This test is not yet approved or cleared by the [place] FDA and has been authorized for detection and/or diagnosis of SARS-CoV-2 by FDA under an Emergency Use Authorization (EUA). This EUA will remain in effect (meaning this test can be used) for the duration of the COVID-19 declaration under Section 564(b)(1) of the Act, 21 U.S.C. section 360bbb-3(b)(1), unless the authorization is terminated or revoked.  Performed at [facility], [address], [place], [place] [address]   Urine Culture     Status: Abnormal   Collection Time: 12/11/20 12:37 AM   Specimen: Urine, Random  Result Value Ref Range Status   Specimen Description   Final    URINE, RANDOM Performed at [facility], [address], [place], [place] [address]    Special Requests   Final    NONE Performed at [facility], [address], [place], [place] [address]    Culture (A)  Final    >=100,000 COLONIES/mL ESCHERICHIA COLI 20,000 COLONIES/mL PROTEUS MIRABILIS    Report Status 12/13/2020 FINAL  Final   Organism ID, Bacteria ESCHERICHIA COLI (A)  Final   Organism ID, Bacteria PROTEUS MIRABILIS (A)  Final      Susceptibility   Escherichia coli - MIC*    AMPICILLIN >=32 RESISTANT Resistant     CEFAZOLIN <=4 SENSITIVE Sensitive     CEFEPIME <=0.12 SENSITIVE Sensitive      CEFTRIAXONE <=0.25 SENSITIVE Sensitive  CIPROFLOXACIN <=0.25 SENSITIVE Sensitive     GENTAMICIN <=1 SENSITIVE Sensitive     IMIPENEM <=0.25 SENSITIVE Sensitive     NITROFURANTOIN 32 SENSITIVE Sensitive     TRIMETH/SULFA <=20 SENSITIVE Sensitive     AMPICILLIN/SULBACTAM 16 INTERMEDIATE Intermediate     PIP/TAZO <=4 SENSITIVE Sensitive     * >=100,000 COLONIES/mL ESCHERICHIA COLI   Proteus mirabilis - MIC*    AMPICILLIN <=2 SENSITIVE Sensitive     CEFAZOLIN <=4 SENSITIVE Sensitive     CEFEPIME <=0.12 SENSITIVE Sensitive     CEFTRIAXONE <=0.25 SENSITIVE Sensitive     CIPROFLOXACIN <=0.25 SENSITIVE Sensitive     GENTAMICIN <=1 SENSITIVE Sensitive     IMIPENEM 2 SENSITIVE Sensitive     NITROFURANTOIN RESISTANT Resistant     TRIMETH/SULFA <=20 SENSITIVE Sensitive     AMPICILLIN/SULBACTAM <=2 SENSITIVE Sensitive     PIP/TAZO <=4 SENSITIVE Sensitive     * 20,000 COLONIES/mL PROTEUS MIRABILIS    Radiology Reports DG Chest 1 View  Result Date: 12/11/2020 CLINICAL DATA:  Shortness of breath EXAM: CHEST  1 VIEW COMPARISON:  05/16/2018. FINDINGS: Evaluation is limited by body habitus and positioning. Low lung volumes. The heart is likely normal in size, given projection. No focal pulmonary opacity. No definite pleural effusion or pneumothorax. No acute osseous abnormality. IMPRESSION: Evaluation is limited by body habitus, positioning, and low lung volumes. Within this limitation, no acute cardiopulmonary process. Electronically Signed   By: Wiliam Ke M.D.   On: 12/11/2020 21:48   US Abdomen Complete  Result Date: 12/13/2020 CLINICAL DATA:  Elevated LFTs and nausea and vomiting EXAM: ABDOMEN ULTRASOUND COMPLETE COMPARISON:  Ultrasound from 11/20/2020 and CT from 12/09/2020 FINDINGS: Gallbladder: Gallbladder is well distended with evidence of sludge. No cholelithiasis is noted. No wall thickening or pericholecystic fluid is noted. Common bile duct: Diameter: 3 mm Liver: Diffuse increased  echogenicity is noted without focal mass consistent with fatty infiltration. This is similar to that seen on prior CT examination. Portal vein is patent on color Doppler imaging with normal direction of blood flow towards the liver. IVC: No abnormality visualized. Pancreas: Visualized portion unremarkable. Spleen: Size and appearance within normal limits. Right Kidney: Length: 13.9 cm. Echogenicity within normal limits. No mass or hydronephrosis visualized. Left Kidney: Length: 12.4 cm. Echogenicity within normal limits. No mass or hydronephrosis visualized. Abdominal aorta: No aneurysm visualized. Other findings: None. IMPRESSION: Gallbladder sludge. Fatty infiltration of the liver. No other focal abnormality is noted. Electronically Signed   By: Alcide Clever M.D.   On: 12/13/2020 20:30   DG Knee Left Port  Result Date: 12/13/2020 CLINICAL DATA:  History of dislocation of knee. EXAM: PORTABLE LEFT KNEE - 1-2 VIEW COMPARISON:  Most recent radiograph 11/21/2020 FINDINGS: Proximal fibular fracture is less well-defined on the current exam, unclear if this is related to differences in technique or interval healing. There is a small fracture fragment projects lateral to the lateral tibial plateau. Fracture fragment arising from the medial femoral condyle is faintly visualized. Narrowing of the medial and widening of the lateral tibiofemoral compartments. Patellofemoral relationship is not well-defined on the current exam due to habitus and positioning. IMPRESSION: 1. Proximal fibular fracture is less well-defined on the current exam, unclear if this is related to differences in technique or interval healing. Small fracture fragment projects lateral to the lateral tibial plateau. 2. Medial femoral condyle fracture fragment is faintly visualized. 3. Narrowing of the medial and widening of the lateral tibiofemoral compartments, possibly due to ligamentous injury. Electronically  Signed   By: Narda Rutherford M.D.   On:  12/13/2020 10:36   DG Knee Left Port  Result Date: 11/21/2020 CLINICAL DATA:  Knee injury, fall a couple weeks ago EXAM: PORTABLE LEFT KNEE - 1-2 VIEW COMPARISON:  None. FINDINGS: No evidence of fracture, dislocation, or joint effusion. No evidence of arthropathy or other focal bone abnormality. Soft tissues are unremarkable. IMPRESSION: No fracture or dislocation of the left knee. Joint spaces are preserved. Electronically Signed   By: Lauralyn Primes M.D.   On: 11/21/2020 19:15   DG ABD ACUTE 2+V W 1V CHEST  Result Date: 12/13/2020 CLINICAL DATA:  Nausea and vomiting. EXAM: DG ABDOMEN ACUTE WITH 1 VIEW CHEST COMPARISON:  CT 12/09/2020 FINDINGS: Evaluation is limited due to body habitus. Shallow inspiration with elevation of the right hemidiaphragm. Linear areas of infiltration or atelectasis in the right mid and lower lungs. Left lung is clear. Heart size and pulmonary vascularity are normal. No free air under the hemidiaphragms. Gas and stool throughout the colon. No small or large bowel distention is appreciated. No radiopaque stones are identified. IMPRESSION: 1. Shallow inspiration with elevation of the right hemidiaphragm. Infiltration or atelectasis in the right lung. Possibly pneumonia. 2. Nonobstructive bowel gas pattern with gas-filled colon. Electronically Signed   By: Burman Nieves M.D.   On: 12/13/2020 20:11   VAS [place] LOWER EXTREMITY VENOUS (DVT)  Result Date: 11/18/2020  Lower Venous DVT Study Patient Name:  PALAK TERCERO  Date of Exam:   11/18/2020 Medical Rec #: [id number]          Accession #:    [id number] Date of Birth: Sep 10, 1990         Patient Gender: F Patient Age:   1 years Exam Location:  [facility] Procedure:      VAS [place] LOWER EXTREMITY VENOUS (DVT) Referring Phys: STEPHEN CHIU --------------------------------------------------------------------------------  Indications: Pain.  Risk Factors: Trauma. Limitations: Body habitus and poor ultrasound/tissue interface.  Comparison Study: No prior studies. Performing Technologist: Chanda Busing RVT  Examination Guidelines: A complete evaluation includes B-mode imaging, spectral Doppler, color Doppler, and power Doppler as needed of all accessible portions of each vessel. Bilateral testing is considered an integral part of a complete examination. Limited examinations for reoccurring indications may be performed as noted. The reflux portion of the exam is performed with the patient in reverse Trendelenburg.  +-----+---------------+---------+-----------+----------+--------------+ RIGHTCompressibilityPhasicitySpontaneityPropertiesThrombus Aging +-----+---------------+---------+-----------+----------+--------------+ CFV  Full           Yes      Yes                                 +-----+---------------+---------+-----------+----------+--------------+   +---------+---------------+---------+-----------+----------+-------------------+ LEFT     CompressibilityPhasicitySpontaneityPropertiesThrombus Aging      +---------+---------------+---------+-----------+----------+-------------------+ CFV      Full           Yes      Yes                                      +---------+---------------+---------+-----------+----------+-------------------+ SFJ      Full                                                             +---------+---------------+---------+-----------+----------+-------------------+  FV Prox  Full                                                             +---------+---------------+---------+-----------+----------+-------------------+ FV Mid   Full                                                             +---------+---------------+---------+-----------+----------+-------------------+ FV Distal               Yes      Yes                                      +---------+---------------+---------+-----------+----------+-------------------+ PFV      Full                                                              +---------+---------------+---------+-----------+----------+-------------------+ POP      Full           Yes      Yes                                      +---------+---------------+---------+-----------+----------+-------------------+ PTV      Full                                                             +---------+---------------+---------+-----------+----------+-------------------+ PERO                                                  Not well visualized +---------+---------------+---------+-----------+----------+-------------------+    Summary: RIGHT: - No evidence of common femoral vein obstruction.  LEFT: - There is no evidence of deep vein thrombosis in the lower extremity. However, portions of this examination were limited- see technologist comments above.  - No cystic structure found in the popliteal fossa.  *See table(s) above for measurements and observations. Electronically signed by Sherald Hess MD on 11/18/2020 at 2:43:02 PM.    Final    CT Angio Abd/Pel W and/or Wo Contrast  Result Date: 12/09/2020 CLINICAL DATA:  Concern for chronic mesenteric ischemia. EXAM: CTA ABDOMEN AND PELVIS WITHOUT AND WITH CONTRAST TECHNIQUE: Multidetector CT imaging of the abdomen and pelvis was performed using the standard protocol during bolus administration of intravenous contrast. Multiplanar reconstructed images and MIPs were obtained and reviewed to evaluate the vascular anatomy. CONTRAST:  OMNIPAQUE IOHEXOL 350 MG/ML SOLN COMPARISON:  CT abdomen and pelvis 06/08/2016. FINDINGS: VASCULAR  Aorta: Normal caliber aorta without aneurysm, dissection, vasculitis or significant stenosis. Celiac: Patent without evidence of aneurysm, dissection, vasculitis or significant stenosis. SMA: Patent without evidence of aneurysm, dissection, vasculitis or significant stenosis. Renals: Both renal arteries are patent without evidence of aneurysm, dissection,  vasculitis, fibromuscular dysplasia or significant stenosis. IMA: Patent without evidence of aneurysm, dissection, vasculitis or significant stenosis. Inflow: Patent without evidence of aneurysm, dissection, vasculitis or significant stenosis. Proximal Outflow: Bilateral common femoral and visualized portions of the superficial and profunda femoral arteries are patent without evidence of aneurysm, dissection, vasculitis or significant stenosis. Veins: No obvious venous abnormality within the limitations of this arterial phase study. Review of the MIP images confirms the above findings. NON-VASCULAR Lower chest: No acute abnormality. Hepatobiliary: Liver is enlarged. There is diffuse fatty infiltration of the liver. No calcified gallstones are seen. There is no biliary ductal dilatation identified. Pancreas: Unremarkable. No pancreatic ductal dilatation or surrounding inflammatory changes. Spleen: Normal in size without focal abnormality. Adrenals/Urinary Tract: Adrenal glands are unremarkable. Kidneys are normal, without renal calculi, focal lesion, or hydronephrosis. Bladder is unremarkable. Stomach/Bowel: Stomach is within normal limits. Appendix appears normal. No evidence of bowel wall thickening, distention, or inflammatory changes. Lymphatic: No significant vascular findings are present. No enlarged abdominal or pelvic lymph nodes. Reproductive: Uterus and bilateral adnexa are unremarkable. Other: No abdominal wall hernia or abnormality. No abdominopelvic ascites. Musculoskeletal: No acute or significant osseous findings. IMPRESSION: VASCULAR 1. No acute vascular pathology identified. NON-VASCULAR 1. No acute localizing process in the abdomen or pelvis. 2. Hepatomegaly and hepatic steatosis. Electronically Signed   By: Darliss Cheney M.D.   On: 12/09/2020 19:22   US Abdomen Limited RUQ (LIVER/GB)  Result Date: 11/20/2020 CLINICAL DATA:  Cholelithiasis. EXAM: ULTRASOUND ABDOMEN LIMITED RIGHT UPPER QUADRANT  COMPARISON:  None. FINDINGS: Gallbladder: No gallstones or wall thickening visualized (2.5 mm). No sonographic Murphy sign noted by sonographer. Common bile duct: Diameter: 5.1 mm Liver: No focal lesion identified. Within normal limits in parenchymal echogenicity. Portal vein is patent on color Doppler imaging with normal direction of blood flow towards the liver. Other: None. IMPRESSION: Normal right upper quadrant ultrasound. Electronically Signed   By: Aram Candela M.D.   On: 11/20/2020 00:54   DG ESOPHAGUS W SINGLE CM (SOL OR THIN BA)  Result Date: 11/18/2020 CLINICAL DATA:  Emesis with attempted taking of pills. Nausea. Gagging. EXAM: ESOPHOGRAM/BARIUM SWALLOW TECHNIQUE: Single contrast examination was performed using  thin barium. FLUOROSCOPY TIME:  Fluoroscopy Time:   minute and 0 seconds Radiation Exposure Index (if provided by the fluoroscopic device): 23.4 mGy Number of Acquired Spot Images: 0 COMPARISON:  11/03/2020 CTA chest FINDINGS: An attempted focused single-contrast exam was initiated. The patient was placed in LPO position, as secondary to immobility and morbid obesity, she could not stand upright or be placed in RAO position. With attempted single swallow, no gross narrowing or stricture is identified. Prior to contrast traversing the distal esophagus, the patient exhibited emesis. A second attempt was performed, with immediate emesis as well. IMPRESSION: Extremely limited exam, secondary to episodes of emesis with individual swallows. No gross esophageal stricture identified to the level of the lower thoracic esophagus. Electronically Signed   By: Jeronimo Greaves M.D.   On: 11/18/2020 12:12    Lab Data:  CBC: Recent Labs  Lab 12/09/20 1552 12/10/20 0421 12/11/20 0603 12/12/20 4098 12/13/20 0523 12/14/20 0549  WBC 8.2 7.9 6.3 6.2 4.8 4.9  NEUTROABS 5.7  --   --   --  2.2 2.6  HGB 10.9* 10.4* 9.2* 8.9* 8.8* 9.6*  HCT 35.9* 34.1* 30.0* 29.3* 28.6* 30.9*  MCV 97.0 98.8 98.4  98.7 99.3 100.3*  PLT 395 341 297 284 269 254   Basic Metabolic Panel: Recent Labs  Lab 12/10/20 0421 12/11/20 0603 12/12/20 0623 12/13/20 0523 12/14/20 0703  NA 143 141 144 140 142  K 4.5 3.7 4.1 3.8 3.6  CL 107 107 110 105 105  CO2 GLUCOSE 88 107* 95 92 131*  BUN <5*  CREATININE 0.44 0.56 0.53 0.49 0.45  CALCIUM 9.2 8.5* 8.8* 8.5* 8.7*  MG  --   --  1.9 1.9 2.0  PHOS  --   --   --   --  2.9   GFR: Estimated Creatinine Clearance: 177.6 mL/min (by C-G formula based on SCr of 0.45 mg/dL). Liver Function Tests: Recent Labs  Lab 12/09/20 1552 12/10/20 0421 12/11/20 0603 12/13/20 0523 12/14/20 0703  AST 158* 117* 86* 130* 135*  ALT 66* 56* 45* 51* 53*  ALKPHOS 73 62 54 50 58  BILITOT 2.0* 2.3* 2.0* 1.6* 1.7*  PROT 8.8* 8.3* 7.4 7.1 7.4  ALBUMIN 3.7 3.4* 3.1* 3.0* 3.1*   Recent Labs  Lab 12/09/20 1552  LIPASE 23   No results for input(s): AMMONIA in the last 168 hours. Coagulation Profile: No results for input(s): INR, PROTIME in the last 168 hours. Cardiac Enzymes: No results for input(s): CKTOTAL, CKMB, CKMBINDEX, TROPONINI in the last 168 hours. BNP (last 3 results) No results for input(s): PROBNP in the last 8760 hours. HbA1C: No results for input(s): HGBA1C in the last 72 hours. CBG: Recent Labs  Lab 12/10/20 0832 12/13/20 1104  GLUCAP 93 96   Lipid Profile: No results for input(s): CHOL, HDL, LDLCALC, TRIG, CHOLHDL, LDLDIRECT in the last 72 hours. Thyroid Function Tests: No results for input(s): TSH, T4TOTAL, FREET4, T3FREE, THYROIDAB in the last 72 hours. Anemia Panel: No results for input(s): VITAMINB12, FOLATE, FERRITIN, TIBC, IRON, RETICCTPCT in the last 72 hours. Urine analysis:    Component Value Date/Time   COLORURINE AMBER (A) 12/11/2020 0037   APPEARANCEUR CLOUDY (A) 12/11/2020 0037   LABSPEC 1.020 12/11/2020 0037   PHURINE 5.0 12/11/2020 0037   GLUCOSEU NEGATIVE 12/11/2020 0037   HGBUR LARGE (A) 12/11/2020  0037   BILIRUBINUR NEGATIVE 12/11/2020 0037   KETONESUR 5 (A) 12/11/2020 0037   PROTEINUR 100 (A) 12/11/2020 0037   UROBILINOGEN 1.0 10/27/2013 2116   NITRITE NEGATIVE 12/11/2020 0037   LEUKOCYTESUR LARGE (A) 12/11/2020 0037     Evellyn Tuff M.D. [facility] 12/14/2020, 3:52 PM  Available via Epic secure chat 7am-7pm After 7 pm, please refer to night coverage provider listed on amion.

## 2020-12-14 NOTE — Progress Notes (Signed)
Inpatient Rehabilitation Admissions Coordinator   Inpatient rehab consult received. I am familiar with pateint from last hospitalization . I spoke with patient by phone to assess current home situation. She went home last admit and was seen in ER within 24 hrs due to nausea and vomiting. She again returned home where her cousin came to assist for 4 days and then left once again. Her bed that she obtained for last d/c is too high and she is using a step stool to get into which is creating a barrier. She is hopeful that once her nausea is better and with continued therapy at [facility] that she can again return home. She continues to feel that cost of care estimate for [facility] admit is not acceptable and she has been denied for disability and medicaid thus far. I would recommend to intensify therapy at [facility] as much as possible and continue efforts to mobilize her as medical treatment for her Nausea and vomiting. [facility] beds limited this week. I will follow up with her in a few days to assess her progress. She would be difficult to place for SNF.  Ottie Glazier, RN, MSN Rehab Admissions Coordinator [phone] 12/14/2020 4:55 PM

## 2020-12-15 LAB — COMPREHENSIVE METABOLIC PANEL
ALT: 45 U/L — ABNORMAL HIGH (ref 0–44)
AST: 119 U/L — ABNORMAL HIGH (ref 15–41)
Albumin: 3.2 g/dL — ABNORMAL LOW (ref 3.5–5.0)
Alkaline Phosphatase: 57 U/L (ref 38–126)
Anion gap: 7 (ref 5–15)
BUN: <5 mg/dL — ABNORMAL LOW (ref 6–20)
CO2: 26 mmol/L (ref 22–32)
Calcium: 9.1 mg/dL (ref 8.9–10.3)
Chloride: 108 mmol/L (ref 98–111)
Creatinine, Ser: 0.68 mg/dL (ref 0.44–1.00)
GFR, Estimated: >60 mL/min (ref >60)
Glucose, Bld: 101 mg/dL — ABNORMAL HIGH (ref 70–99)
Potassium: 3.6 mmol/L (ref 3.5–5.1)
Sodium: 141 mmol/L (ref 135–145)
Total Bilirubin: 2.1 mg/dL — ABNORMAL HIGH (ref 0.3–1.2)
Total Protein: 7.5 g/dL (ref 6.5–8.1)

## 2020-12-15 LAB — CBC
HCT: 29.6 % — ABNORMAL LOW (ref 36.0–46.0)
Hemoglobin: 9.1 g/dL — ABNORMAL LOW (ref 12.0–15.0)
MCH: 30.3 pg (ref 26.0–34.0)
MCHC: 30.7 g/dL (ref 30.0–36.0)
MCV: 98.7 fL (ref 80.0–100.0)
Platelets: 300 10*3/uL (ref 150–400)
RBC: 3 MIL/uL — ABNORMAL LOW (ref 3.87–5.11)
RDW: 31.3 % — ABNORMAL HIGH (ref 11.5–15.5)
WBC: 6.7 10*3/uL (ref 4.0–10.5)
nRBC: 0.6 % — ABNORMAL HIGH (ref 0.0–0.2)

## 2020-12-15 LAB — LIPASE, BLOOD: Lipase: 25 U/L (ref 11–51)

## 2020-12-15 LAB — STREP PNEUMONIAE URINARY ANTIGEN: Strep Pneumo Urinary Antigen: NEGATIVE

## 2020-12-15 NOTE — Progress Notes (Signed)
[facility]                                                                              Patient Demographics  Carla Little, is a 30 y.o. female, DOB - Sep 25, 1990, WUJ:[id number]  Admit date - 12/09/2020   Admitting Physician No admitting provider for patient encounter.  Outpatient Primary MD for the patient is Patient, No Pcp Per (Inactive)  Outpatient specialists:   LOS - 5  days   Medical records reviewed and are as summarized below:    Chief Complaint  Patient presents with   Emesis   Nausea       Brief summary   Patient is a 30 year old female with morbid obesity, nonalcoholic fatty liver disease, GERD, depression, recently was hospitalized following a mechanical fall with a left knee dislocation, resultant MCL strain, ACL and PCL rupture then complicated by nausea vomiting, tachycardia, UTI and fecal impaction during the hospitalization. Patient had an endoscopy which noted small antral ulcer on 8/27, RUQ ultrasound was unremarkable.  Patient presented to ED with multiple episodes of nausea, vomiting, aches and pains everywhere. In ED, afebrile, mildly tachycardic, hypertensive.  Labs showed mildly abnormal AST, ALT, bilirubin of 2.  Assessment & Plan    Principal Problem:   Intractable vomiting with nausea -Unclear cause -Patient noted to have prolonged course of nausea and vomiting during recent hospitalization -EGD 11/19/2020 had shown small antral ulcer.  CT abdomen and pelvis with hepatomegaly fatty liver disease, no ductal dilatation or gallstones. -Abd [place] 9/20 showed gallbladder distended with sludge but no cholelithiasis, no wall thickening or pericholecystic fluid -CT angio abd-pelvis on 9/16 showed no vascular pathology, no acute localizing process in abdomen and pelvis.  Hepatomegaly with hepatic steatosis -Placed on IV Reglan, scheduled, PPI, however per patient, no significant improvement -Still having nausea and vomiting, diet was  advanced to full liquids, wants to keep the full liquids.  Lipase 25 -Due to transaminitis, extensive GI work-up so far unremarkable, will check HIDA scan. -Needs to increase mobility, out of bed to chair, ambulate  E. coli UTI -Urine culture showed more than 100,000 colonies of E. coli and 20,000 colonies of Proteus mirabilis -Continue IV Ancef for total 7 days  Recent left knee dislocation -Followed by orthopedics, appreciate recommendations by Dr. Magnus Ivan  -Recommended continue brace for another 2 to 4 weeks  -PT recommended SNF  Sinus tachycardia -Continue IV Lopressor  Hypothyroidism -TSH 11.8 on 11/07/2020, T4 0.9 -Follow TSH, free T4  GERD -Continue PPI  Iron deficiency anemia -History of heavy menorrhagia on her cycle, H&H stable  Morbid obesity Estimated body mass index is 73.38 kg/m as calculated from the following:   Height as of this encounter:  (1.626 m).   Weight as of this encounter: 193.9 kg.  Code Status: Full DVT Prophylaxis:  SCDs Start: 12/09/20 1937   Level of Care: Level of care: Telemetry Family Communication: Discussed all imaging results, lab results, explained to the patient   Disposition Plan:     Status is: Inpatient  Remains inpatient appropriate because:Inpatient level of care appropriate due to severity of illness  Dispo: The patient is  from: Home              Anticipated d/c is to: SNF              Patient currently is not medically stable to d/c.  Persistent nausea and vomiting, work-up in progress   Difficult to place patient No   Time Spent in minutes: 25 minutes  Procedures:  None  Consultants:   None  Antimicrobials:   Anti-infectives (From admission, onward)    Start     Dose/Rate Route Frequency Ordered Stop   12/13/20 2200  metroNIDAZOLE (FLAGYL) IVPB 500 mg        500 mg 100 mL/hr over 60 Minutes Intravenous Every 8 hours 12/13/20 2107     12/13/20 1000  ceFAZolin (ANCEF) IVPB 2g/100 mL premix        2  g 200 mL/hr over 30 Minutes Intravenous Every 8 hours 12/13/20 0927     12/12/20 1000  cefTRIAXone (ROCEPHIN) 2 g in sodium chloride 0.9 % 100 mL IVPB  Status:  Discontinued        2 g 200 mL/hr over 30 Minutes Intravenous Every 24 hours 12/12/20 0901 12/13/20 0928   12/11/20 0845  cefTRIAXone (ROCEPHIN) 1 g in sodium chloride 0.9 % 100 mL IVPB  Status:  Discontinued        1 g 200 mL/hr over 30 Minutes Intravenous Every 24 hours 12/11/20 0758 12/12/20 0901          Medications  Scheduled Meds:  bisacodyl  10 mg Rectal Once   ciprofloxacin  2 drop Right Eye Q4H while awake   metoCLOPramide (REGLAN) injection  10 mg Intravenous Q8H   metoprolol tartrate  10 mg Intravenous Q6H   pantoprazole (PROTONIX) IV  40 mg Intravenous Q12H   prochlorperazine  10 mg Intravenous Q8H   senna-docusate  1 tablet Oral BID   simethicone  160 mg Oral QID   Continuous Infusions:  0.9 % NaCl with KCl 20 mEq / L 125 mL/hr at 12/15/20 1250    ceFAZolin (ANCEF) IV 2 g (12/15/20 1006)   metronidazole 500 mg (12/15/20 0629)   ondansetron (ZOFRAN) IV 8 mg (12/15/20 1051)   PRN Meds:.acetaminophen **OR** acetaminophen, HYDROmorphone (DILAUDID) injection, menthol-cetylpyridinium, ondansetron **OR** ondansetron (ZOFRAN) IV, phenol      Subjective:   Carla Little was seen and examined today.  States still having nausea and overnight had multiple episodes of vomiting.  Has diffuse abdominal pain.  Feels miserable.    Objective:   Vitals:   12/14/20 1418 12/14/20 2023 12/15/20 0548 12/15/20 0952  BP: 120/74 117/73 115/79   Pulse: (!) 101 (!) 108 (!) 107 95  Resp: 16 20 18 18   Temp: 98.8 F (37.1 C) 98.1 F (36.7 C) 98.1 F (36.7 C)   TempSrc: Oral  Oral   SpO2: 95% 98% 99% 99%  Weight:      Height:        Intake/Output Summary (Last 24 hours) at 12/15/2020 1331 Last data filed at 12/15/2020 0238 Gross per 24 hour  Intake --  Output 250 ml  Net -250 ml     Wt Readings from Last 3  Encounters:  12/14/20 (!) 193.9 kg  11/01/20 (!) 181.4 kg  10/27/13 (!) 161.9 kg    Physical Exam General: Alert and oriented x 3, NAD Cardiovascular: S1 S2 clear, RRR. No pedal edema b/l Respiratory: CTAB, no wheezing, rales or rhonchi Gastrointestinal: Soft, mild diffuse TTP , NBS, obese Ext: no pedal  edema bilaterally Neuro: no new deficits Psych: anxious tearful   Data Reviewed:  I have personally reviewed following labs and imaging studies  Micro Results Recent Results (from the past 240 hour(s))  Resp Panel by RT-PCR (Flu A&B, Covid) Nasopharyngeal Swab     Status: None   Collection Time: 12/09/20  8:10 PM   Specimen: Nasopharyngeal Swab; Nasopharyngeal(NP) swabs in vial transport medium  Result Value Ref Range Status   SARS Coronavirus 2 by RT PCR NEGATIVE NEGATIVE Final    Comment: (NOTE) SARS-CoV-2 target nucleic acids are NOT DETECTED.  The SARS-CoV-2 RNA is generally detectable in upper respiratory specimens during the acute phase of infection. The lowest concentration of SARS-CoV-2 viral copies this assay can detect is 138 copies/mL. A negative result does not preclude SARS-Cov-2 infection and should not be used as the sole basis for treatment or other patient management decisions. A negative result may occur with  improper specimen collection/handling, submission of specimen other than nasopharyngeal swab, presence of viral mutation(s) within the areas targeted by this assay, and inadequate number of viral copies(<138 copies/mL). A negative result must be combined with clinical observations, patient history, and epidemiological information. The expected result is Negative.  Fact Sheet for Patients:  BloggerCourse.com  Fact Sheet for Healthcare Providers:  SeriousBroker.it  This test is no t yet approved or cleared by the [place] FDA and  has been authorized for detection and/or diagnosis of SARS-CoV-2  by FDA under an Emergency Use Authorization (EUA). This EUA will remain  in effect (meaning this test can be used) for the duration of the COVID-19 declaration under Section 564(b)(1) of the Act, 21 U.S.C.section 360bbb-3(b)(1), unless the authorization is terminated  or revoked sooner.       Influenza A by PCR NEGATIVE NEGATIVE Final   Influenza B by PCR NEGATIVE NEGATIVE Final    Comment: (NOTE) The Xpert Xpress SARS-CoV-2/FLU/RSV plus assay is intended as an aid in the diagnosis of influenza from Nasopharyngeal swab specimens and should not be used as a sole basis for treatment. Nasal washings and aspirates are unacceptable for Xpert Xpress SARS-CoV-2/FLU/RSV testing.  Fact Sheet for Patients: BloggerCourse.com  Fact Sheet for Healthcare Providers: SeriousBroker.it  This test is not yet approved or cleared by the [place] FDA and has been authorized for detection and/or diagnosis of SARS-CoV-2 by FDA under an Emergency Use Authorization (EUA). This EUA will remain in effect (meaning this test can be used) for the duration of the COVID-19 declaration under Section 564(b)(1) of the Act, 21 U.S.C. section 360bbb-3(b)(1), unless the authorization is terminated or revoked.  Performed at [facility], [address] [place], [place] [address]   Urine Culture     Status: Abnormal   Collection Time: 12/11/20 12:37 AM   Specimen: Urine, Random  Result Value Ref Range Status   Specimen Description   Final    URINE, RANDOM Performed at [facility], [address], [place], [place] [address]    Special Requests   Final    NONE Performed at [facility], [address], [place], [place] [address]    Culture (A)  Final    >=100,000 COLONIES/mL ESCHERICHIA COLI 20,000 COLONIES/mL PROTEUS MIRABILIS    Report Status 12/13/2020 FINAL  Final   Organism ID, Bacteria  ESCHERICHIA COLI (A)  Final   Organism ID, Bacteria PROTEUS MIRABILIS (A)  Final      Susceptibility   Escherichia coli - MIC*    AMPICILLIN >=  32 RESISTANT Resistant     CEFAZOLIN <=4 SENSITIVE Sensitive     CEFEPIME <=0.12 SENSITIVE Sensitive     CEFTRIAXONE <=0.25 SENSITIVE Sensitive     CIPROFLOXACIN <=0.25 SENSITIVE Sensitive     GENTAMICIN <=1 SENSITIVE Sensitive     IMIPENEM <=0.25 SENSITIVE Sensitive     NITROFURANTOIN 32 SENSITIVE Sensitive     TRIMETH/SULFA <=20 SENSITIVE Sensitive     AMPICILLIN/SULBACTAM 16 INTERMEDIATE Intermediate     PIP/TAZO <=4 SENSITIVE Sensitive     * >=100,000 COLONIES/mL ESCHERICHIA COLI   Proteus mirabilis - MIC*    AMPICILLIN <=2 SENSITIVE Sensitive     CEFAZOLIN <=4 SENSITIVE Sensitive     CEFEPIME <=0.12 SENSITIVE Sensitive     CEFTRIAXONE <=0.25 SENSITIVE Sensitive     CIPROFLOXACIN <=0.25 SENSITIVE Sensitive     GENTAMICIN <=1 SENSITIVE Sensitive     IMIPENEM 2 SENSITIVE Sensitive     NITROFURANTOIN RESISTANT Resistant     TRIMETH/SULFA <=20 SENSITIVE Sensitive     AMPICILLIN/SULBACTAM <=2 SENSITIVE Sensitive     PIP/TAZO <=4 SENSITIVE Sensitive     * 20,000 COLONIES/mL PROTEUS MIRABILIS    Radiology Reports DG Chest 1 View  Result Date: 12/11/2020 CLINICAL DATA:  Shortness of breath EXAM: CHEST  1 VIEW COMPARISON:  05/16/2018. FINDINGS: Evaluation is limited by body habitus and positioning. Low lung volumes. The heart is likely normal in size, given projection. No focal pulmonary opacity. No definite pleural effusion or pneumothorax. No acute osseous abnormality. IMPRESSION: Evaluation is limited by body habitus, positioning, and low lung volumes. Within this limitation, no acute cardiopulmonary process. Electronically Signed   By: Wiliam Ke M.D.   On: 12/11/2020 21:48   US Abdomen Complete  Result Date: 12/13/2020 CLINICAL DATA:  Elevated LFTs and nausea and vomiting EXAM: ABDOMEN ULTRASOUND COMPLETE COMPARISON:  Ultrasound from  11/20/2020 and CT from 12/09/2020 FINDINGS: Gallbladder: Gallbladder is well distended with evidence of sludge. No cholelithiasis is noted. No wall thickening or pericholecystic fluid is noted. Common bile duct: Diameter: 3 mm Liver: Diffuse increased echogenicity is noted without focal mass consistent with fatty infiltration. This is similar to that seen on prior CT examination. Portal vein is patent on color Doppler imaging with normal direction of blood flow towards the liver. IVC: No abnormality visualized. Pancreas: Visualized portion unremarkable. Spleen: Size and appearance within normal limits. Right Kidney: Length: 13.9 cm. Echogenicity within normal limits. No mass or hydronephrosis visualized. Left Kidney: Length: 12.4 cm. Echogenicity within normal limits. No mass or hydronephrosis visualized. Abdominal aorta: No aneurysm visualized. Other findings: None. IMPRESSION: Gallbladder sludge. Fatty infiltration of the liver. No other focal abnormality is noted. Electronically Signed   By: Alcide Clever M.D.   On: 12/13/2020 20:30   DG Knee Left Port  Result Date: 12/13/2020 CLINICAL DATA:  History of dislocation of knee. EXAM: PORTABLE LEFT KNEE - 1-2 VIEW COMPARISON:  Most recent radiograph 11/21/2020 FINDINGS: Proximal fibular fracture is less well-defined on the current exam, unclear if this is related to differences in technique or interval healing. There is a small fracture fragment projects lateral to the lateral tibial plateau. Fracture fragment arising from the medial femoral condyle is faintly visualized. Narrowing of the medial and widening of the lateral tibiofemoral compartments. Patellofemoral relationship is not well-defined on the current exam due to habitus and positioning. IMPRESSION: 1. Proximal fibular fracture is less well-defined on the current exam, unclear if this is related to differences in technique or interval healing. Small fracture fragment projects lateral to  the lateral tibial  plateau. 2. Medial femoral condyle fracture fragment is faintly visualized. 3. Narrowing of the medial and widening of the lateral tibiofemoral compartments, possibly due to ligamentous injury. Electronically Signed   By: Narda Rutherford M.D.   On: 12/13/2020 10:36   DG Knee Left Port  Result Date: 11/21/2020 CLINICAL DATA:  Knee injury, fall a couple weeks ago EXAM: PORTABLE LEFT KNEE - 1-2 VIEW COMPARISON:  None. FINDINGS: No evidence of fracture, dislocation, or joint effusion. No evidence of arthropathy or other focal bone abnormality. Soft tissues are unremarkable. IMPRESSION: No fracture or dislocation of the left knee. Joint spaces are preserved. Electronically Signed   By: Lauralyn Primes M.D.   On: 11/21/2020 19:15   DG ABD ACUTE 2+V W 1V CHEST  Result Date: 12/13/2020 CLINICAL DATA:  Nausea and vomiting. EXAM: DG ABDOMEN ACUTE WITH 1 VIEW CHEST COMPARISON:  CT 12/09/2020 FINDINGS: Evaluation is limited due to body habitus. Shallow inspiration with elevation of the right hemidiaphragm. Linear areas of infiltration or atelectasis in the right mid and lower lungs. Left lung is clear. Heart size and pulmonary vascularity are normal. No free air under the hemidiaphragms. Gas and stool throughout the colon. No small or large bowel distention is appreciated. No radiopaque stones are identified. IMPRESSION: 1. Shallow inspiration with elevation of the right hemidiaphragm. Infiltration or atelectasis in the right lung. Possibly pneumonia. 2. Nonobstructive bowel gas pattern with gas-filled colon. Electronically Signed   By: Burman Nieves M.D.   On: 12/13/2020 20:11   VAS [place] LOWER EXTREMITY VENOUS (DVT)  Result Date: 11/18/2020  Lower Venous DVT Study Patient Name:  RHIANNE SOMAN  Date of Exam:   11/18/2020 Medical Rec #: [id number]          Accession #:    [id number] Date of Birth: [birth year]         Patient Gender: F Patient Age:   62 years Exam Location:  [facility] Procedure:      VAS [place]  LOWER EXTREMITY VENOUS (DVT) Referring Phys: STEPHEN CHIU --------------------------------------------------------------------------------  Indications: Pain.  Risk Factors: Trauma. Limitations: Body habitus and poor ultrasound/tissue interface. Comparison Study: No prior studies. Performing Technologist: Chanda Busing RVT  Examination Guidelines: A complete evaluation includes B-mode imaging, spectral Doppler, color Doppler, and power Doppler as needed of all accessible portions of each vessel. Bilateral testing is considered an integral part of a complete examination. Limited examinations for reoccurring indications may be performed as noted. The reflux portion of the exam is performed with the patient in reverse Trendelenburg.  +-----+---------------+---------+-----------+----------+--------------+ RIGHTCompressibilityPhasicitySpontaneityPropertiesThrombus Aging +-----+---------------+---------+-----------+----------+--------------+ CFV  Full           Yes      Yes                                 +-----+---------------+---------+-----------+----------+--------------+   +---------+---------------+---------+-----------+----------+-------------------+ LEFT     CompressibilityPhasicitySpontaneityPropertiesThrombus Aging      +---------+---------------+---------+-----------+----------+-------------------+ CFV      Full           Yes      Yes                                      +---------+---------------+---------+-----------+----------+-------------------+ SFJ      Full                                                             +---------+---------------+---------+-----------+----------+-------------------+  FV Prox  Full                                                             +---------+---------------+---------+-----------+----------+-------------------+ FV Mid   Full                                                              +---------+---------------+---------+-----------+----------+-------------------+ FV Distal               Yes      Yes                                      +---------+---------------+---------+-----------+----------+-------------------+ PFV      Full                                                             +---------+---------------+---------+-----------+----------+-------------------+ POP      Full           Yes      Yes                                      +---------+---------------+---------+-----------+----------+-------------------+ PTV      Full                                                             +---------+---------------+---------+-----------+----------+-------------------+ PERO                                                  Not well visualized +---------+---------------+---------+-----------+----------+-------------------+    Summary: RIGHT: - No evidence of common femoral vein obstruction.  LEFT: - There is no evidence of deep vein thrombosis in the lower extremity. However, portions of this examination were limited- see technologist comments above.  - No cystic structure found in the popliteal fossa.  *See table(s) above for measurements and observations. Electronically signed by Sherald Hess MD on 11/18/2020 at 2:43:02 PM.    Final    CT Angio Abd/Pel W and/or Wo Contrast  Result Date: 12/09/2020 CLINICAL DATA:  Concern for chronic mesenteric ischemia. EXAM: CTA ABDOMEN AND PELVIS WITHOUT AND WITH CONTRAST TECHNIQUE: Multidetector CT imaging of the abdomen and pelvis was performed using the standard protocol during bolus administration of intravenous contrast. Multiplanar reconstructed images and MIPs were obtained and reviewed to evaluate the vascular anatomy. CONTRAST:  OMNIPAQUE IOHEXOL 350 MG/ML SOLN COMPARISON:  CT abdomen and pelvis 06/08/2016. FINDINGS: VASCULAR  Aorta: Normal caliber aorta without aneurysm, dissection, vasculitis or  significant stenosis. Celiac: Patent without evidence of aneurysm, dissection, vasculitis or significant stenosis. SMA: Patent without evidence of aneurysm, dissection, vasculitis or significant stenosis. Renals: Both renal arteries are patent without evidence of aneurysm, dissection, vasculitis, fibromuscular dysplasia or significant stenosis. IMA: Patent without evidence of aneurysm, dissection, vasculitis or significant stenosis. Inflow: Patent without evidence of aneurysm, dissection, vasculitis or significant stenosis. Proximal Outflow: Bilateral common femoral and visualized portions of the superficial and profunda femoral arteries are patent without evidence of aneurysm, dissection, vasculitis or significant stenosis. Veins: No obvious venous abnormality within the limitations of this arterial phase study. Review of the MIP images confirms the above findings. NON-VASCULAR Lower chest: No acute abnormality. Hepatobiliary: Liver is enlarged. There is diffuse fatty infiltration of the liver. No calcified gallstones are seen. There is no biliary ductal dilatation identified. Pancreas: Unremarkable. No pancreatic ductal dilatation or surrounding inflammatory changes. Spleen: Normal in size without focal abnormality. Adrenals/Urinary Tract: Adrenal glands are unremarkable. Kidneys are normal, without renal calculi, focal lesion, or hydronephrosis. Bladder is unremarkable. Stomach/Bowel: Stomach is within normal limits. Appendix appears normal. No evidence of bowel wall thickening, distention, or inflammatory changes. Lymphatic: No significant vascular findings are present. No enlarged abdominal or pelvic lymph nodes. Reproductive: Uterus and bilateral adnexa are unremarkable. Other: No abdominal wall hernia or abnormality. No abdominopelvic ascites. Musculoskeletal: No acute or significant osseous findings. IMPRESSION: VASCULAR 1. No acute vascular pathology identified. NON-VASCULAR 1. No acute localizing process  in the abdomen or pelvis. 2. Hepatomegaly and hepatic steatosis. Electronically Signed   By: Darliss Cheney M.D.   On: 12/09/2020 19:22   US Abdomen Limited RUQ (LIVER/GB)  Result Date: 11/20/2020 CLINICAL DATA:  Cholelithiasis. EXAM: ULTRASOUND ABDOMEN LIMITED RIGHT UPPER QUADRANT COMPARISON:  None. FINDINGS: Gallbladder: No gallstones or wall thickening visualized (2.5 mm). No sonographic Murphy sign noted by sonographer. Common bile duct: Diameter: 5.1 mm Liver: No focal lesion identified. Within normal limits in parenchymal echogenicity. Portal vein is patent on color Doppler imaging with normal direction of blood flow towards the liver. Other: None. IMPRESSION: Normal right upper quadrant ultrasound. Electronically Signed   By: Aram Candela M.D.   On: 11/20/2020 00:54   DG ESOPHAGUS W SINGLE CM (SOL OR THIN BA)  Result Date: 11/18/2020 CLINICAL DATA:  Emesis with attempted taking of pills. Nausea. Gagging. EXAM: ESOPHOGRAM/BARIUM SWALLOW TECHNIQUE: Single contrast examination was performed using  thin barium. FLUOROSCOPY TIME:  Fluoroscopy Time:   minute and 0 seconds Radiation Exposure Index (if provided by the fluoroscopic device): 23.4 mGy Number of Acquired Spot Images: 0 COMPARISON:  11/03/2020 CTA chest FINDINGS: An attempted focused single-contrast exam was initiated. The patient was placed in LPO position, as secondary to immobility and morbid obesity, she could not stand upright or be placed in RAO position. With attempted single swallow, no gross narrowing or stricture is identified. Prior to contrast traversing the distal esophagus, the patient exhibited emesis. A second attempt was performed, with immediate emesis as well. IMPRESSION: Extremely limited exam, secondary to episodes of emesis with individual swallows. No gross esophageal stricture identified to the level of the lower thoracic esophagus. Electronically Signed   By: Jeronimo Greaves M.D.   On: 11/18/2020 12:12    Lab  Data:  CBC: Recent Labs  Lab 12/09/20 1552 12/10/20 0421 12/11/20 0603 12/12/20 4098 12/13/20 0523 12/14/20 0549 12/15/20 0520  WBC 8.2   < > 6.3 6.2 4.8 4.9 6.7  NEUTROABS 5.7  --   --   --  2.2 2.6  --   HGB 10.9*   < > 9.2* 8.9* 8.8* 9.6* 9.1*  HCT 35.9*   < > 30.0* 29.3* 28.6* 30.9* 29.6*  MCV 97.0   < > 98.4 98.7 99.3 100.3* 98.7  PLT 395   < > 297 284 269 254 300   < > = values in this interval not displayed.   Basic Metabolic Panel: Recent Labs  Lab 12/11/20 0603 12/12/20 0623 12/13/20 0523 12/14/20 0703 12/15/20 0520  NA 141 144 140 142 141  K 3.7 4.1 3.8 3.6 3.6  CL 107 110 105 105 108  CO2 27 25 28 26 26   GLUCOSE 107* 95 92 131* 101*  BUN 8 8 6  <5* <5*  CREATININE 0.56 0.53 0.49 0.45 0.68  CALCIUM 8.5* 8.8* 8.5* 8.7* 9.1  MG  --  1.9 1.9 2.0  --   PHOS  --   --   --  2.9  --    GFR: Estimated Creatinine Clearance: 177.6 mL/min (by C-G formula based on SCr of 0.68 mg/dL). Liver Function Tests: Recent Labs  Lab 12/10/20 0421 12/11/20 0603 12/13/20 0523 12/14/20 0703 12/15/20 0520  AST 117* 86* 130* 135* 119*  ALT 56* 45* 51* 53* 45*  ALKPHOS 62 54 50 58 57  BILITOT 2.3* 2.0* 1.6* 1.7* 2.1*  PROT 8.3* 7.4 7.1 7.4 7.5  ALBUMIN 3.4* 3.1* 3.0* 3.1* 3.2*   Recent Labs  Lab 12/09/20 1552 12/15/20 0520  LIPASE 23 25   No results for input(s): AMMONIA in the last 168 hours. Coagulation Profile: No results for input(s): INR, PROTIME in the last 168 hours. Cardiac Enzymes: No results for input(s): CKTOTAL, CKMB, CKMBINDEX, TROPONINI in the last 168 hours. BNP (last 3 results) No results for input(s): PROBNP in the last 8760 hours. HbA1C: No results for input(s): HGBA1C in the last 72 hours. CBG: Recent Labs  Lab 12/10/20 0832 12/13/20 1104  GLUCAP 93 96   Lipid Profile: No results for input(s): CHOL, HDL, LDLCALC, TRIG, CHOLHDL, LDLDIRECT in the last 72 hours. Thyroid Function Tests: No results for input(s): TSH, T4TOTAL, FREET4, T3FREE,  THYROIDAB in the last 72 hours. Anemia Panel: No results for input(s): VITAMINB12, FOLATE, FERRITIN, TIBC, IRON, RETICCTPCT in the last 72 hours. Urine analysis:    Component Value Date/Time   COLORURINE AMBER (A) 12/11/2020 0037   APPEARANCEUR CLOUDY (A) 12/11/2020 0037   LABSPEC 1.020 12/11/2020 0037   PHURINE 5.0 12/11/2020 0037   GLUCOSEU NEGATIVE 12/11/2020 0037   HGBUR LARGE (A) 12/11/2020 0037   BILIRUBINUR NEGATIVE 12/11/2020 0037   KETONESUR 5 (A) 12/11/2020 0037   PROTEINUR 100 (A) 12/11/2020 0037   UROBILINOGEN 1.0 10/27/2013 2116   NITRITE NEGATIVE 12/11/2020 0037   LEUKOCYTESUR LARGE (A) 12/11/2020 0037     Taneasha Fuqua M.D. [facility] 12/15/2020, 1:31 PM  Available via Epic secure chat 7am-7pm After 7 pm, please refer to night coverage provider listed on amion.

## 2020-12-15 NOTE — Progress Notes (Signed)
Pt was being assisted to [facility] by this nurse and Manauja, NT. Pt stood with walker and was unable to pivot to [facility]. PT sat down and stated she would try again. When Pt stood the second time she said she was not able to turn so staff assisted pt to kneeling position then to sitting on floor. Tom, CRN notified. No injuries noted, Neuro WNL, no new skin abrasions.    12/15/20 1945  What Happened  Was fall witnessed? Yes  Who witnessed fall? Dahlia Client, RN Manauja, tech  Patients activity before fall bathroom-assisted  Point of contact hip/leg  Was patient injured? No  Follow Up  MD notified Dr.  Time MD notified 55  Family notified No - patient refusal  Additional tests No  Simple treatment Other (comment) (No treatment needed)  Progress note created (see row info) Yes  Adult Fall Risk Assessment  Risk Factor Category (scoring not indicated) Fall has occurred during this admission (document High fall risk)  Age 1  Fall History: Fall within 6 months prior to admission 5  Elimination; Bowel and/or Urine Incontinence 2  Elimination; Bowel and/or Urine Urgency/Frequency 0  Medications: includes PCA/Opiates, Anti-convulsants, Anti-hypertensives, Diuretics, Hypnotics, Laxatives, Sedatives, and Psychotropics 3  Patient Care Equipment 1  Mobility-Assistance 2  Mobility-Gait 2  Mobility-Sensory Deficit 0  Altered awareness of immediate physical environment 0  Impulsiveness 0  Lack of understanding of one's physical/cognitive limitations 0  Total Score 15  Patient Fall Risk Level High fall risk  Adult Fall Risk Interventions  Required Bundle Interventions *See Row Information* High fall risk - low, moderate, and high requirements implemented  Additional Interventions Use of appropriate toileting equipment (bedpan, BSC, etc.)  Screening for Fall Injury Risk (To be completed on HIGH fall risk patients) - Assessing Need for Floor Mats  Risk For Fall Injury- Criteria for Floor Mats Previous fall this  admission  Will Implement Floor Mats Yes

## 2020-12-16 ENCOUNTER — Inpatient Hospital Stay (HOSPITAL_COMMUNITY): Payer: Medicaid Other

## 2020-12-16 LAB — COMPREHENSIVE METABOLIC PANEL
ALT: 33 U/L (ref 0–44)
AST: 138 U/L — ABNORMAL HIGH (ref 15–41)
Albumin: 3.1 g/dL — ABNORMAL LOW (ref 3.5–5.0)
Alkaline Phosphatase: 51 U/L (ref 38–126)
Anion gap: 12 (ref 5–15)
BUN: 5 mg/dL — ABNORMAL LOW (ref 6–20)
CO2: 22 mmol/L (ref 22–32)
Calcium: 8.6 mg/dL — ABNORMAL LOW (ref 8.9–10.3)
Chloride: 104 mmol/L (ref 98–111)
Creatinine, Ser: 0.55 mg/dL (ref 0.44–1.00)
GFR, Estimated: >60 mL/min (ref >60)
Glucose, Bld: 96 mg/dL (ref 70–99)
Potassium: 4.2 mmol/L (ref 3.5–5.1)
Sodium: 138 mmol/L (ref 135–145)
Total Bilirubin: 2.4 mg/dL — ABNORMAL HIGH (ref 0.3–1.2)
Total Protein: 7.1 g/dL (ref 6.5–8.1)

## 2020-12-16 LAB — T4, FREE: Free T4: 1.32 ng/dL — ABNORMAL HIGH (ref 0.61–1.12)

## 2020-12-16 LAB — TSH
TSH: 7.268 u[IU]/mL — ABNORMAL HIGH (ref 0.350–4.500)
TSH: 5.227 u[IU]/mL — ABNORMAL HIGH (ref 0.350–4.500)

## 2020-12-16 LAB — LEGIONELLA PNEUMOPHILA SEROGP 1 UR AG: L. pneumophila Serogp 1 Ur Ag: NEGATIVE

## 2020-12-16 LAB — VITAMIN B12: Vitamin B-12: 153 pg/mL — ABNORMAL LOW (ref 180–914)

## 2020-12-16 LAB — FOLATE: Folate: 3.2 ng/mL — ABNORMAL LOW (ref >5.9)

## 2020-12-16 MED ORDER — METHYLPREDNISOLONE SODIUM SUCC 125 MG IJ SOLR
125.0000 mg | Freq: Once | INTRAMUSCULAR | Status: AC
  Administered 2020-12-16: 125 mg via INTRAVENOUS
  Filled 2020-12-16: qty 2

## 2020-12-16 MED ORDER — TECHNETIUM TC 99M MEBROFENIN IV KIT
7.2000 | PACK | Freq: Once | INTRAVENOUS | Status: AC
  Administered 2020-12-16: 7.2 via INTRAVENOUS

## 2020-12-16 MED ORDER — FAMOTIDINE IN NACL 20-0.9 MG/50ML-% IV SOLN
20.0000 mg | Freq: Two times a day (BID) | INTRAVENOUS | Status: AC
  Administered 2020-12-16 – 2020-12-17 (×2): 20 mg via INTRAVENOUS
  Filled 2020-12-16 (×2): qty 50

## 2020-12-16 MED ORDER — SODIUM CHLORIDE 0.9 % IV BOLUS
1000.0000 mL | Freq: Once | INTRAVENOUS | Status: AC
  Administered 2020-12-16: 1000 mL via INTRAVENOUS

## 2020-12-16 MED ORDER — FOLIC ACID 1 MG PO TABS
1.0000 mg | ORAL_TABLET | Freq: Every day | ORAL | Status: DC
  Administered 2020-12-17: 1 mg via ORAL
  Filled 2020-12-16 (×3): qty 1

## 2020-12-16 MED ORDER — DIPHENHYDRAMINE HCL 12.5 MG/5ML PO ELIX
25.0000 mg | ORAL_SOLUTION | Freq: Four times a day (QID) | ORAL | Status: DC | PRN
  Administered 2020-12-21 – 2021-01-03 (×6): 25 mg via ORAL
  Filled 2020-12-16 (×7): qty 10

## 2020-12-16 MED ORDER — GABAPENTIN 250 MG/5ML PO SOLN
100.0000 mg | Freq: Three times a day (TID) | ORAL | Status: DC
  Administered 2020-12-16 – 2021-01-04 (×55): 100 mg via ORAL
  Filled 2020-12-16 (×58): qty 2

## 2020-12-16 MED ORDER — CYANOCOBALAMIN 1000 MCG/ML IJ SOLN
1000.0000 ug | Freq: Once | INTRAMUSCULAR | Status: AC
  Administered 2020-12-17: 1000 ug via INTRAMUSCULAR
  Filled 2020-12-16 (×2): qty 1

## 2020-12-16 MED ORDER — OXYCODONE HCL 5 MG/5ML PO SOLN
5.0000 mg | ORAL | Status: DC | PRN
  Administered 2020-12-16 – 2021-01-04 (×68): 5 mg via ORAL
  Filled 2020-12-16 (×70): qty 5

## 2020-12-16 MED ORDER — NYSTATIN 100000 UNIT/ML MT SUSP
5.0000 mL | Freq: Four times a day (QID) | OROMUCOSAL | Status: AC
  Administered 2020-12-16 – 2020-12-23 (×25): 500000 [IU] via ORAL
  Filled 2020-12-16 (×28): qty 5

## 2020-12-16 MED ORDER — DIPHENHYDRAMINE HCL 50 MG/ML IJ SOLN
25.0000 mg | Freq: Once | INTRAMUSCULAR | Status: AC
  Administered 2020-12-16: 25 mg via INTRAVENOUS
  Filled 2020-12-16: qty 1

## 2020-12-16 NOTE — Progress Notes (Signed)
Patient c/o of dizziness, tongue swelling with worsening tightening of her throat at night, numbness and tinging bilaterally both arms and legs. Vital signs are good. Called rapid response for second opinion. MD aware. No new orders at this time.

## 2020-12-16 NOTE — Consult Note (Signed)
Reason for Consult: Persistent nausea and vomiting Referring Physician: [facility]  [name] HPI: This is a 30 year old female with a PMH of morbid obesity, LA Grade B esophagitis, and an antral erosion versus a very small ulcer readmitted for persistent nausea and vomiting.  She was evaluated last month for these symptoms, which developed acutely when she was in the hospital for her leg.  Further work up with an EGD showed an LA Grade B esophagitis and a very small antral ulcer versus and erosion.  Pantoprazole and sucralfate were provided for her and he improved, but she states that the symptoms never resolved.  Upon discharge she states that she did not have the pantoprazole and sucralfate.  She represented back to the ER with the same symptoms, but she did not require admission.  On 12/09/2020 she was readmitted with nausea and vomiting.  She currently reports that her tongue swells and it is difficult for her to tolerate any PO.  This will then result in vomiting.  Currently she complains of tingling her in her hands and feet.  The patient was on metronidazole staring 12/13/2020 for unclear reasons.  She is being treated with Ancef for her UTI.  An extensive work up with a CTA, repeat ultrasound, and HIDA scan were negative for any biliary source.  Her AST was noted to be elevated.  Past Medical History:  Diagnosis Date   Class 3 obesity 12/09/2020   Depression    GERD (gastroesophageal reflux disease)    Nonalcoholic steatohepatitis (NASH) 12/09/2020   Obesity    PUD (peptic ulcer disease) 12/09/2020    Past Surgical History:  Procedure Laterality Date   BIOPSY  11/19/2020   Procedure: BIOPSY;  Surgeon: Charna Elizabeth, MD;  Location: [facility];  Service: Endoscopy;;   ESOPHAGOGASTRODUODENOSCOPY (EGD) WITH PROPOFOL N/A 11/19/2020   Procedure: ESOPHAGOGASTRODUODENOSCOPY (EGD) WITH PROPOFOL;  Surgeon: Charna Elizabeth, MD;  Location: [facility];  Service: Endoscopy;  Laterality: N/A;     Family History  Problem Relation Age of Onset   Hypertension Other     Social History:  reports that she has never smoked. She has never used smokeless tobacco. She reports current alcohol use. She reports that she does not use drugs.  Allergies:  Allergies  Allergen Reactions   Azithromycin Shortness Of Breath and Nausea And Vomiting    Medications: Scheduled:  bisacodyl  10 mg Rectal Once   ciprofloxacin  2 drop Right Eye Q4H while awake   metoCLOPramide (REGLAN) injection  10 mg Intravenous Q8H   metoprolol tartrate  10 mg Intravenous Q6H   pantoprazole (PROTONIX) IV  40 mg Intravenous Q12H   prochlorperazine  10 mg Intravenous Q8H   senna-docusate  1 tablet Oral BID   simethicone  160 mg Oral QID   Continuous:  0.9 % NaCl with KCl 20 mEq / L 125 mL/hr at 12/16/20 1124    ceFAZolin (ANCEF) IV 2 g (12/16/20 1124)   ondansetron (ZOFRAN) IV 8 mg (12/15/20 1750)    Results for orders placed or performed during the hospital encounter of 12/09/20 (from the past 24 hour(s))  TSH     Status: Abnormal   Collection Time: 12/16/20  4:46 AM  Result Value Ref Range   TSH 7.268 (H) 0.350 - 4.500 uIU/mL  T4, free     Status: Abnormal   Collection Time: 12/16/20  4:46 AM  Result Value Ref Range   Free T4 1.32 (H) 0.61 - 1.12 ng/dL  Comprehensive  metabolic panel     Status: Abnormal   Collection Time: 12/16/20  4:46 AM  Result Value Ref Range   Sodium 138 135 - 145 mmol/L   Potassium 4.2 3.5 - 5.1 mmol/L   Chloride 104 98 - 111 mmol/L   CO2 22 22 - 32 mmol/L   Glucose, Bld 96 70 - 99 mg/dL   BUN 5 (L) 6 - 20 mg/dL   Creatinine, Ser 7.61 0.44 - 1.00 mg/dL   Calcium 8.6 (L) 8.9 - 10.3 mg/dL   Total Protein 7.1 6.5 - 8.1 g/dL   Albumin 3.1 (L) 3.5 - 5.0 g/dL   AST 950 (H) 15 - 41 U/L   ALT 33 0 - 44 U/L   Alkaline Phosphatase 51 38 - 126 U/L   Total Bilirubin 2.4 (H) 0.3 - 1.2 mg/dL   GFR, Estimated >93 >26 mL/min   Anion gap 12 5 - 15     NM Hepato W/EF  Result  Date: 12/16/2020 CLINICAL DATA:  Nausea and vomiting. Gallbladder sludge. Elevated bilirubin EXAM: NUCLEAR MEDICINE HEPATOBILIARY IMAGING WITH GALLBLADDER EF TECHNIQUE: Sequential images of the abdomen were obtained out to 60 minutes following intravenous administration of radiopharmaceutical. After oral ingestion of Ensure, gallbladder ejection fraction was determined. At 60 min, normal ejection fraction is greater than 33%. RADIOPHARMACEUTICALS:  7.2 mCi Tc-32m  Choletec IV COMPARISON:  Ultrasound 12/13/2020, CT 12/09/2020 FINDINGS: There is prompt clearance radiotracer from the blood pool but some delay in clearance into the biliary tree presumably related to elevated bilirubin. The gallbladder fills by 60 minutes. Small amount of counts are present in the small bowel. Fatty meal was administered and the gallbladder contracted near completely prior to re-imaging. Calculated gallbladder ejection fraction is 99%. (Normal gallbladder ejection fraction with Ensure is greater than 33%.) IMPRESSION: 1. Patent cystic duct and common bile duct. Filling of the gallbladder. 2. Rapid emptying of the gallbladder.  99% ejection fraction. 3. Slow transient radiotracer into the biliary tree and small bowel related to elevated bilirubin. Electronically Signed   By: Genevive Bi M.D.   On: 12/16/2020 12:27    ROS:  As stated above in the HPI otherwise negative.  Blood pressure 119/83, pulse (!) 106, temperature 98.7 F (37.1 C), resp. rate 15, height 5\' 4"  (1.626 m), weight (!) 193.9 kg, last menstrual period 12/09/2020, SpO2 96 %.    PE: Gen: NAD, Alert and Oriented HEENT:  [place]/AT, EOMI Neck: Supple, no LAD Lungs: CTA Bilaterally CV: RRR without M/G/R ABD: Soft, NTND, +BS Ext: No C/C/E  Assessment/Plan: 1) Persistent nausea and vomiting. 2) Elevated AST. 3) Hepatomegaly.   She source of her symptoms is unknown.  Since the last hospitalization her AST increased in value.  The imaging is positive for  hepatomegaly.  Liver enlargement, in some patients, can result in chronic nausea, but her symptoms started acutely in the hospital this past August.  Since being in the hospital she was started on pantoprazole, but there was no discernable improvement.  Sucralfate is not on her medication list and it is unclear if she can tolerate the medication.  Plan: 1) Stop metronidazole. 2) Restart sucralfate. 3) ? Neurology consultation for her complaints of parasthesisas? 4) [facility] will cover for the weekend.  Nayshawn Mesta D 12/16/2020, 4:23 PM

## 2020-12-16 NOTE — Progress Notes (Signed)
Physical Therapy Treatment Patient Details Name: Carla Little MRN: [id number] DOB: [birth year] Today's Date: 12/16/2020   History of Present Illness Patient is a 30 y.o. female who presented to [facility] for intractable N/V on 9/17. ED labwork reveals e. coli UTI. Pt had recent hospital admission from 8/8-9/5 due to Lt knee dislocation which was reduced with fractures of the proximal fibula ligamentous avulsion laterally as well as medially off the medial femoral condyle and MRI showed complete ACL & PCL tears and MCL strain. Patient was placed in knee immobilizer throughout duration of admission with no knee flexion permitted. That hospital admission was complicated by nausea, vomiting, tachycardia, UTI, and fecal impaction; she was treated for nausea and vomiting with supportive care, had an endoscopy which noted a small antral ulcer on 8/27. As of 8/13 she was placed in a Lt Bledsoe brace with WBAT orders and no flexion of knee. PMH significant for morbid obesity, GERD, fatty liver disease, depression.    PT Comments    Pt requiring increased assist for bed mobility today. Pt supervision level for bed mobility last session and currently max-total assist +2 or more.   Pt also reports generalized numbness and tongue swelling and audible lisping throughout session observed.  Pt with uncoordinated arm movement as well however no significant difference in UE strength.  Due to reported symptoms and decreased mobility and coordination, notified RN (pt states RN aware of tongue swelling).    Recommendations for follow up therapy are one component of a multi-disciplinary discharge planning process, led by the attending physician.  Recommendations may be updated based on patient status, additional functional criteria and insurance authorization.  Follow Up Recommendations  CIR;Supervision for mobility/OOB     Equipment Recommendations  Rolling walker with 5" wheels;3in1 (PT);Wheelchair (measurements  PT);Wheelchair cushion (measurements PT)    Recommendations for Other Services       Precautions / Restrictions Precautions Precautions: Fall Precaution Comments: PT Orders from Maureen Ralphs, MD; "WBAT Left LE, only up with a walker, does not need brace at this standpoint" Restrictions Weight Bearing Restrictions: No LLE Weight Bearing: Weight bearing as tolerated Other Position/Activity Restrictions: PT Orders from Maureen Ralphs, MD; "WBAT Left LE, only up with a walker, does not need brace at this standpoint"     Mobility  Bed Mobility Overal bed mobility: Needs Assistance Bed Mobility: Supine to Sit;Sit to Supine Rolling: Total assist;+2 for physical assistance   Supine to sit: Max assist;+2 for physical assistance Sit to supine: Max assist;+2 for physical assistance   General bed mobility comments: pt reports weakness, increased assist required today for upper and lower body    Transfers                 General transfer comment: pt unable, sat EOB for a couple minutes and reports dizziness with worsened, pt also with truncal swaying so assisted pt back to supine; HR 106 bpm, BP 120/72 mmHg  Ambulation/Gait                 Stairs             Wheelchair Mobility    Modified Rankin (Stroke Patients Only)       Balance                                            Cognition Arousal/Alertness: Awake/alert Behavior During Therapy:  WFL for tasks assessed/performed Overall Cognitive Status: Within Functional Limits for tasks assessed                                        Exercises      General Comments        Pertinent Vitals/Pain Pain Assessment: Faces Faces Pain Scale: Hurts even more Pain Location: LLE Pain Descriptors / Indicators: Discomfort;Grimacing;Guarding Pain Intervention(s): Monitored during session;Repositioned    Home Living                      Prior Function            PT  Goals (current goals can now be found in the care plan section) Progress towards PT goals: Progressing toward goals    Frequency           PT Plan Current plan remains appropriate    Co-evaluation              AM-PAC PT "6 Clicks" Mobility   Outcome Measure  Help needed turning from your back to your side while in a flat bed without using bedrails?: Total Help needed moving from lying on your back to sitting on the side of a flat bed without using bedrails?: Total Help needed moving to and from a bed to a chair (including a wheelchair)?: Total Help needed standing up from a chair using your arms (e.g., wheelchair or bedside chair)?: Total Help needed to walk in hospital room?: Total Help needed climbing 3-5 steps with a railing? : Total 6 Click Score: 6    End of Session Equipment Utilized During Treatment: Gait belt Activity Tolerance: Patient limited by fatigue (limited by dizziness) Patient left: in bed;with call bell/phone within reach;with bed alarm set Nurse Communication: Mobility status PT Visit Diagnosis: Muscle weakness (generalized) (M62.81)     Time: 6237-6283 PT Time Calculation (min) (ACUTE ONLY): 31 min  Charges:  $Therapeutic Activity: 23-37 mins                    Thomasene Mohair PT, DPT Acute Rehabilitation Services Pager: [phone] Office: [phone]    Janan Halter [place] 12/16/2020, 3:42 PM

## 2020-12-16 NOTE — Progress Notes (Addendum)
[facility]                                                                              Patient Demographics  Carla Little, is a 30 y.o. female, DOB - 1991-03-11, ENI:[id number]  Admit date - 12/09/2020   Admitting Physician No admitting provider for patient encounter.  Outpatient Primary MD for the patient is Patient, No Pcp Per (Inactive)  Outpatient specialists:   LOS - 6  days   Medical records reviewed and are as summarized below:    Chief Complaint  Patient presents with   Emesis   Nausea       Brief summary   Patient is a 30 year old female with morbid obesity, nonalcoholic fatty liver disease, GERD, depression, recently was hospitalized following a mechanical fall with a left knee dislocation, resultant MCL strain, ACL and PCL rupture then complicated by nausea vomiting, tachycardia, UTI and fecal impaction during the hospitalization. Patient had an endoscopy which noted small antral ulcer on 8/27, RUQ ultrasound was unremarkable.  Patient presented to ED with multiple episodes of nausea, vomiting, aches and pains everywhere. In ED, afebrile, mildly tachycardic, hypertensive.  Labs showed mildly abnormal AST, ALT, bilirubin of 2.  Assessment & Plan    Principal Problem:   Intractable vomiting with nausea -Unclear cause -Patient noted to have prolonged course of nausea and vomiting during recent hospitalization -EGD 11/19/2020 had shown small antral ulcer.  CT abdomen and pelvis with hepatomegaly fatty liver disease, no ductal dilatation or gallstones. -Abd [place] 9/20 showed gallbladder distended with sludge but no cholelithiasis, no wall thickening or pericholecystic fluid -CT angio abd-pelvis on 9/16 showed no vascular pathology, no acute localizing process in abdomen and pelvis.  Hepatomegaly with hepatic steatosis -Patient was placed on IV Reglan, PPI, still states no significant improvement.   -HIDA scan today, 99% EF Lipase 25, will check  with GI   E. coli UTI -Urine culture showed more than 100,000 colonies of E. coli and 20,000 colonies of Proteus mirabilis -DC ancef, flagyl (has received 5 days), no dysuria/fevers   Recent left knee dislocation -Followed by orthopedics, appreciate recommendations by Dr. Magnus Ivan  -Recommended continue brace for another 2 to 4 weeks  -PT recommended SNF  Sinus tachycardia -Continue beta-blocker  Hypothyroidism -TSH 11.8 on 11/07/2020, T4 0.9 -Follow TSH, free T4  GERD -Continue PPI  Iron deficiency anemia -History of heavy menorrhagia on her cycle, -H&H stable  Morbid obesity Estimated body mass index is 73.38 kg/m as calculated from the following:   Height as of this encounter: 5\' 4"  (1.626 m).   Weight as of this encounter: 193.9 kg.  Code Status: Full DVT Prophylaxis:  SCDs Start: 12/09/20 1937   Level of Care: Level of care: Telemetry Family Communication: Discussed all imaging results, lab results, explained to the patient   Disposition Plan:     Status is: Inpatient  Remains inpatient appropriate because:Inpatient level of care appropriate due to severity of illness  Dispo: The patient is from: Home              Anticipated d/c is to: SNF  Patient currently is not medically stable to d/c.  Persistent nausea and vomiting, work-up in progress   Difficult to place patient No   Time Spent in minutes: 25 minutes Procedures:  None  Consultants:   None  Antimicrobials:   Anti-infectives (From admission, onward)    Start     Dose/Rate Route Frequency Ordered Stop   12/13/20 2200  metroNIDAZOLE (FLAGYL) IVPB 500 mg        500 mg 100 mL/hr over 60 Minutes Intravenous Every 8 hours 12/13/20 2107     12/13/20 1000  ceFAZolin (ANCEF) IVPB 2g/100 mL premix        2 g 200 mL/hr over 30 Minutes Intravenous Every 8 hours 12/13/20 0927     12/12/20 1000  cefTRIAXone (ROCEPHIN) 2 g in sodium chloride 0.9 % 100 mL IVPB  Status:  Discontinued        2  g 200 mL/hr over 30 Minutes Intravenous Every 24 hours 12/12/20 0901 12/13/20 0928   12/11/20 0845  cefTRIAXone (ROCEPHIN) 1 g in sodium chloride 0.9 % 100 mL IVPB  Status:  Discontinued        1 g 200 mL/hr over 30 Minutes Intravenous Every 24 hours 12/11/20 0758 12/12/20 0901          Medications  Scheduled Meds:  bisacodyl  10 mg Rectal Once   ciprofloxacin  2 drop Right Eye Q4H while awake   metoCLOPramide (REGLAN) injection  10 mg Intravenous Q8H   metoprolol tartrate  10 mg Intravenous Q6H   pantoprazole (PROTONIX) IV  40 mg Intravenous Q12H   prochlorperazine  10 mg Intravenous Q8H   senna-docusate  1 tablet Oral BID   simethicone  160 mg Oral QID   Continuous Infusions:  0.9 % NaCl with KCl 20 mEq / L 125 mL/hr at 12/16/20 1124    ceFAZolin (ANCEF) IV 2 g (12/16/20 1124)   metronidazole 500 mg (12/16/20 0537)   ondansetron (ZOFRAN) IV 8 mg (12/15/20 1750)   PRN Meds:.acetaminophen **OR** acetaminophen, HYDROmorphone (DILAUDID) injection, menthol-cetylpyridinium, ondansetron **OR** ondansetron (ZOFRAN) IV, phenol      Subjective:   Carla Little was seen and examined today.  Multiple complaints.  States still having nausea and vomiting, whole body feeling numb, abdominal pain, miserable.  No fevers.    Objective:   Vitals:   12/15/20 0548 12/15/20 0952 12/15/20 2013 12/16/20 0418  BP: 115/79  114/71 123/76  Pulse: (!) 107 95 95 (!) 106  Resp: Temp: 98.1 F (36.7 C)  97.9 F (36.6 C) 98.3 F (36.8 C)  TempSrc: Oral  Oral Oral  SpO2: 99% 99% 100% 92%  Weight:      Height:       No intake or output data in the 24 hours ending 12/16/20 1340    Wt Readings from Last 3 Encounters:  12/14/20 (!) 193.9 kg  11/01/20 (!) 181.4 kg  10/27/13 (!) 161.9 kg   Physical Exam General: Alert and oriented x 3, NAD Cardiovascular: S1 S2 clear, RRR. No pedal edema b/l Respiratory: CTAB, no wheezing, rales or rhonchi Gastrointestinal: Soft,  nontender, nondistended, NBS Ext: no pedal edema bilaterally Neuro: no new deficits Skin: No rashes Psych: Normal affect and demeanor, alert and oriented x3     Data Reviewed:  I have personally reviewed following labs and imaging studies  Micro Results Recent Results (from the past 240 hour(s))  Resp Panel by RT-PCR (Flu A&B, Covid) Nasopharyngeal Swab  Status: None   Collection Time: 12/09/20  8:10 PM   Specimen: Nasopharyngeal Swab; Nasopharyngeal(NP) swabs in vial transport medium  Result Value Ref Range Status   SARS Coronavirus 2 by RT PCR NEGATIVE NEGATIVE Final    Comment: (NOTE) SARS-CoV-2 target nucleic acids are NOT DETECTED.  The SARS-CoV-2 RNA is generally detectable in upper respiratory specimens during the acute phase of infection. The lowest concentration of SARS-CoV-2 viral copies this assay can detect is 138 copies/mL. A negative result does not preclude SARS-Cov-2 infection and should not be used as the sole basis for treatment or other patient management decisions. A negative result may occur with  improper specimen collection/handling, submission of specimen other than nasopharyngeal swab, presence of viral mutation(s) within the areas targeted by this assay, and inadequate number of viral copies(<138 copies/mL). A negative result must be combined with clinical observations, patient history, and epidemiological information. The expected result is Negative.  Fact Sheet for Patients:  BloggerCourse.com  Fact Sheet for Healthcare Providers:  SeriousBroker.it  This test is no t yet approved or cleared by the [place] FDA and  has been authorized for detection and/or diagnosis of SARS-CoV-2 by FDA under an Emergency Use Authorization (EUA). This EUA will remain  in effect (meaning this test can be used) for the duration of the COVID-19 declaration under Section 564(b)(1) of the Act, 21 U.S.C.section  360bbb-3(b)(1), unless the authorization is terminated  or revoked sooner.       Influenza A by PCR NEGATIVE NEGATIVE Final   Influenza B by PCR NEGATIVE NEGATIVE Final    Comment: (NOTE) The Xpert Xpress SARS-CoV-2/FLU/RSV plus assay is intended as an aid in the diagnosis of influenza from Nasopharyngeal swab specimens and should not be used as a sole basis for treatment. Nasal washings and aspirates are unacceptable for Xpert Xpress SARS-CoV-2/FLU/RSV testing.  Fact Sheet for Patients: BloggerCourse.com  Fact Sheet for Healthcare Providers: SeriousBroker.it  This test is not yet approved or cleared by the [place] FDA and has been authorized for detection and/or diagnosis of SARS-CoV-2 by FDA under an Emergency Use Authorization (EUA). This EUA will remain in effect (meaning this test can be used) for the duration of the COVID-19 declaration under Section 564(b)(1) of the Act, 21 U.S.C. section 360bbb-3(b)(1), unless the authorization is terminated or revoked.  Performed at [facility], [address], [place], [place] [address]   Urine Culture     Status: Abnormal   Collection Time: 12/11/20 12:37 AM   Specimen: Urine, Random  Result Value Ref Range Status   Specimen Description   Final    URINE, RANDOM Performed at [facility], [address] [place], [place] [address]    Special Requests   Final    NONE Performed at [facility], [address], [place], [place] [address]    Culture (A)  Final    >=100,000 COLONIES/mL ESCHERICHIA COLI 20,000 COLONIES/mL PROTEUS MIRABILIS    Report Status 12/13/2020 FINAL  Final   Organism ID, Bacteria ESCHERICHIA COLI (A)  Final   Organism ID, Bacteria PROTEUS MIRABILIS (A)  Final      Susceptibility   Escherichia coli - MIC*    AMPICILLIN >=32 RESISTANT Resistant     CEFAZOLIN <=4 SENSITIVE Sensitive      CEFEPIME <=0.12 SENSITIVE Sensitive     CEFTRIAXONE <=0.25 SENSITIVE Sensitive     CIPROFLOXACIN <=0.25 SENSITIVE Sensitive     GENTAMICIN <=1 SENSITIVE Sensitive  IMIPENEM <=0.25 SENSITIVE Sensitive     NITROFURANTOIN 32 SENSITIVE Sensitive     TRIMETH/SULFA <=20 SENSITIVE Sensitive     AMPICILLIN/SULBACTAM 16 INTERMEDIATE Intermediate     PIP/TAZO <=4 SENSITIVE Sensitive     * >=100,000 COLONIES/mL ESCHERICHIA COLI   Proteus mirabilis - MIC*    AMPICILLIN <=2 SENSITIVE Sensitive     CEFAZOLIN <=4 SENSITIVE Sensitive     CEFEPIME <=0.12 SENSITIVE Sensitive     CEFTRIAXONE <=0.25 SENSITIVE Sensitive     CIPROFLOXACIN <=0.25 SENSITIVE Sensitive     GENTAMICIN <=1 SENSITIVE Sensitive     IMIPENEM 2 SENSITIVE Sensitive     NITROFURANTOIN RESISTANT Resistant     TRIMETH/SULFA <=20 SENSITIVE Sensitive     AMPICILLIN/SULBACTAM <=2 SENSITIVE Sensitive     PIP/TAZO <=4 SENSITIVE Sensitive     * 20,000 COLONIES/mL PROTEUS MIRABILIS    Radiology Reports DG Chest 1 View  Result Date: 12/11/2020 CLINICAL DATA:  Shortness of breath EXAM: CHEST  1 VIEW COMPARISON:  05/16/2018. FINDINGS: Evaluation is limited by body habitus and positioning. Low lung volumes. The heart is likely normal in size, given projection. No focal pulmonary opacity. No definite pleural effusion or pneumothorax. No acute osseous abnormality. IMPRESSION: Evaluation is limited by body habitus, positioning, and low lung volumes. Within this limitation, no acute cardiopulmonary process. Electronically Signed   By: Wiliam Ke M.D.   On: 12/11/2020 21:48   US Abdomen Complete  Result Date: 12/13/2020 CLINICAL DATA:  Elevated LFTs and nausea and vomiting EXAM: ABDOMEN ULTRASOUND COMPLETE COMPARISON:  Ultrasound from 11/20/2020 and CT from 12/09/2020 FINDINGS: Gallbladder: Gallbladder is well distended with evidence of sludge. No cholelithiasis is noted. No wall thickening or pericholecystic fluid is noted. Common bile duct:  Diameter: 3 mm Liver: Diffuse increased echogenicity is noted without focal mass consistent with fatty infiltration. This is similar to that seen on prior CT examination. Portal vein is patent on color Doppler imaging with normal direction of blood flow towards the liver. IVC: No abnormality visualized. Pancreas: Visualized portion unremarkable. Spleen: Size and appearance within normal limits. Right Kidney: Length: 13.9 cm. Echogenicity within normal limits. No mass or hydronephrosis visualized. Left Kidney: Length: 12.4 cm. Echogenicity within normal limits. No mass or hydronephrosis visualized. Abdominal aorta: No aneurysm visualized. Other findings: None. IMPRESSION: Gallbladder sludge. Fatty infiltration of the liver. No other focal abnormality is noted. Electronically Signed   By: Alcide Clever M.D.   On: 12/13/2020 20:30   NM Hepato W/EF  Result Date: 12/16/2020 CLINICAL DATA:  Nausea and vomiting. Gallbladder sludge. Elevated bilirubin EXAM: NUCLEAR MEDICINE HEPATOBILIARY IMAGING WITH GALLBLADDER EF TECHNIQUE: Sequential images of the abdomen were obtained out to 60 minutes following intravenous administration of radiopharmaceutical. After oral ingestion of Ensure, gallbladder ejection fraction was determined. At 60 min, normal ejection fraction is greater than 33%. RADIOPHARMACEUTICALS:  7.2 mCi Tc-26m  Choletec IV COMPARISON:  Ultrasound 12/13/2020, CT 12/09/2020 FINDINGS: There is prompt clearance radiotracer from the blood pool but some delay in clearance into the biliary tree presumably related to elevated bilirubin. The gallbladder fills by 60 minutes. Small amount of counts are present in the small bowel. Fatty meal was administered and the gallbladder contracted near completely prior to re-imaging. Calculated gallbladder ejection fraction is 99%. (Normal gallbladder ejection fraction with Ensure is greater than 33%.) IMPRESSION: 1. Patent cystic duct and common bile duct. Filling of the  gallbladder. 2. Rapid emptying of the gallbladder.  99% ejection fraction. 3. Slow transient radiotracer into the biliary tree and  small bowel related to elevated bilirubin. Electronically Signed   By: Genevive Bi M.D.   On: 12/16/2020 12:27   DG Knee Left Port  Result Date: 12/13/2020 CLINICAL DATA:  History of dislocation of knee. EXAM: PORTABLE LEFT KNEE - 1-2 VIEW COMPARISON:  Most recent radiograph 11/21/2020 FINDINGS: Proximal fibular fracture is less well-defined on the current exam, unclear if this is related to differences in technique or interval healing. There is a small fracture fragment projects lateral to the lateral tibial plateau. Fracture fragment arising from the medial femoral condyle is faintly visualized. Narrowing of the medial and widening of the lateral tibiofemoral compartments. Patellofemoral relationship is not well-defined on the current exam due to habitus and positioning. IMPRESSION: 1. Proximal fibular fracture is less well-defined on the current exam, unclear if this is related to differences in technique or interval healing. Small fracture fragment projects lateral to the lateral tibial plateau. 2. Medial femoral condyle fracture fragment is faintly visualized. 3. Narrowing of the medial and widening of the lateral tibiofemoral compartments, possibly due to ligamentous injury. Electronically Signed   By: Narda Rutherford M.D.   On: 12/13/2020 10:36   DG Knee Left Port  Result Date: 11/21/2020 CLINICAL DATA:  Knee injury, fall a couple weeks ago EXAM: PORTABLE LEFT KNEE - 1-2 VIEW COMPARISON:  None. FINDINGS: No evidence of fracture, dislocation, or joint effusion. No evidence of arthropathy or other focal bone abnormality. Soft tissues are unremarkable. IMPRESSION: No fracture or dislocation of the left knee. Joint spaces are preserved. Electronically Signed   By: Lauralyn Primes M.D.   On: 11/21/2020 19:15   DG ABD ACUTE 2+V W 1V CHEST  Result Date: 12/13/2020 CLINICAL  DATA:  Nausea and vomiting. EXAM: DG ABDOMEN ACUTE WITH 1 VIEW CHEST COMPARISON:  CT 12/09/2020 FINDINGS: Evaluation is limited due to body habitus. Shallow inspiration with elevation of the right hemidiaphragm. Linear areas of infiltration or atelectasis in the right mid and lower lungs. Left lung is clear. Heart size and pulmonary vascularity are normal. No free air under the hemidiaphragms. Gas and stool throughout the colon. No small or large bowel distention is appreciated. No radiopaque stones are identified. IMPRESSION: 1. Shallow inspiration with elevation of the right hemidiaphragm. Infiltration or atelectasis in the right lung. Possibly pneumonia. 2. Nonobstructive bowel gas pattern with gas-filled colon. Electronically Signed   By: Burman Nieves M.D.   On: 12/13/2020 20:11   VAS [place] LOWER EXTREMITY VENOUS (DVT)  Result Date: 11/18/2020  Lower Venous DVT Study Patient Name:  ROSHAWN LACINA  Date of Exam:   11/18/2020 Medical Rec #: [id number]          Accession #:    [id number] Date of Birth: 1990/09/27         Patient Gender: F Patient Age:   68 years Exam Location:  [facility] Procedure:      VAS [place] LOWER EXTREMITY VENOUS (DVT) Referring Phys: STEPHEN CHIU --------------------------------------------------------------------------------  Indications: Pain.  Risk Factors: Trauma. Limitations: Body habitus and poor ultrasound/tissue interface. Comparison Study: No prior studies. Performing Technologist: Chanda Busing RVT  Examination Guidelines: A complete evaluation includes B-mode imaging, spectral Doppler, color Doppler, and power Doppler as needed of all accessible portions of each vessel. Bilateral testing is considered an integral part of a complete examination. Limited examinations for reoccurring indications may be performed as noted. The reflux portion of the exam is performed with the patient in reverse Trendelenburg.   +-----+---------------+---------+-----------+----------+--------------+ RIGHTCompressibilityPhasicitySpontaneityPropertiesThrombus Aging +-----+---------------+---------+-----------+----------+--------------+ CFV  Full  Yes      Yes                                 +-----+---------------+---------+-----------+----------+--------------+   +---------+---------------+---------+-----------+----------+-------------------+ LEFT     CompressibilityPhasicitySpontaneityPropertiesThrombus Aging      +---------+---------------+---------+-----------+----------+-------------------+ CFV      Full           Yes      Yes                                      +---------+---------------+---------+-----------+----------+-------------------+ SFJ      Full                                                             +---------+---------------+---------+-----------+----------+-------------------+ FV Prox  Full                                                             +---------+---------------+---------+-----------+----------+-------------------+ FV Mid   Full                                                             +---------+---------------+---------+-----------+----------+-------------------+ FV Distal               Yes      Yes                                      +---------+---------------+---------+-----------+----------+-------------------+ PFV      Full                                                             +---------+---------------+---------+-----------+----------+-------------------+ POP      Full           Yes      Yes                                      +---------+---------------+---------+-----------+----------+-------------------+ PTV      Full                                                             +---------+---------------+---------+-----------+----------+-------------------+ PERO  Not well visualized +---------+---------------+---------+-----------+----------+-------------------+    Summary: RIGHT: - No evidence of common femoral vein obstruction.  LEFT: - There is no evidence of deep vein thrombosis in the lower extremity. However, portions of this examination were limited- see technologist comments above.  - No cystic structure found in the popliteal fossa.  *See table(s) above for measurements and observations. Electronically signed by Sherald Hess MD on 11/18/2020 at 2:43:02 PM.    Final    CT Angio Abd/Pel W and/or Wo Contrast  Result Date: 12/09/2020 CLINICAL DATA:  Concern for chronic mesenteric ischemia. EXAM: CTA ABDOMEN AND PELVIS WITHOUT AND WITH CONTRAST TECHNIQUE: Multidetector CT imaging of the abdomen and pelvis was performed using the standard protocol during bolus administration of intravenous contrast. Multiplanar reconstructed images and MIPs were obtained and reviewed to evaluate the vascular anatomy. CONTRAST:  OMNIPAQUE IOHEXOL 350 MG/ML SOLN COMPARISON:  CT abdomen and pelvis 06/08/2016. FINDINGS: VASCULAR Aorta: Normal caliber aorta without aneurysm, dissection, vasculitis or significant stenosis. Celiac: Patent without evidence of aneurysm, dissection, vasculitis or significant stenosis. SMA: Patent without evidence of aneurysm, dissection, vasculitis or significant stenosis. Renals: Both renal arteries are patent without evidence of aneurysm, dissection, vasculitis, fibromuscular dysplasia or significant stenosis. IMA: Patent without evidence of aneurysm, dissection, vasculitis or significant stenosis. Inflow: Patent without evidence of aneurysm, dissection, vasculitis or significant stenosis. Proximal Outflow: Bilateral common femoral and visualized portions of the superficial and profunda femoral arteries are patent without evidence of aneurysm, dissection, vasculitis or significant stenosis. Veins: No obvious venous abnormality within  the limitations of this arterial phase study. Review of the MIP images confirms the above findings. NON-VASCULAR Lower chest: No acute abnormality. Hepatobiliary: Liver is enlarged. There is diffuse fatty infiltration of the liver. No calcified gallstones are seen. There is no biliary ductal dilatation identified. Pancreas: Unremarkable. No pancreatic ductal dilatation or surrounding inflammatory changes. Spleen: Normal in size without focal abnormality. Adrenals/Urinary Tract: Adrenal glands are unremarkable. Kidneys are normal, without renal calculi, focal lesion, or hydronephrosis. Bladder is unremarkable. Stomach/Bowel: Stomach is within normal limits. Appendix appears normal. No evidence of bowel wall thickening, distention, or inflammatory changes. Lymphatic: No significant vascular findings are present. No enlarged abdominal or pelvic lymph nodes. Reproductive: Uterus and bilateral adnexa are unremarkable. Other: No abdominal wall hernia or abnormality. No abdominopelvic ascites. Musculoskeletal: No acute or significant osseous findings. IMPRESSION: VASCULAR 1. No acute vascular pathology identified. NON-VASCULAR 1. No acute localizing process in the abdomen or pelvis. 2. Hepatomegaly and hepatic steatosis. Electronically Signed   By: Darliss Cheney M.D.   On: 12/09/2020 19:22   US Abdomen Limited RUQ (LIVER/GB)  Result Date: 11/20/2020 CLINICAL DATA:  Cholelithiasis. EXAM: ULTRASOUND ABDOMEN LIMITED RIGHT UPPER QUADRANT COMPARISON:  None. FINDINGS: Gallbladder: No gallstones or wall thickening visualized (2.5 mm). No sonographic Murphy sign noted by sonographer. Common bile duct: Diameter: 5.1 mm Liver: No focal lesion identified. Within normal limits in parenchymal echogenicity. Portal vein is patent on color Doppler imaging with normal direction of blood flow towards the liver. Other: None. IMPRESSION: Normal right upper quadrant ultrasound. Electronically Signed   By: Aram Candela M.D.   On:  11/20/2020 00:54   DG ESOPHAGUS W SINGLE CM (SOL OR THIN BA)  Result Date: 11/18/2020 CLINICAL DATA:  Emesis with attempted taking of pills. Nausea. Gagging. EXAM: ESOPHOGRAM/BARIUM SWALLOW TECHNIQUE: Single contrast examination was performed using  thin barium. FLUOROSCOPY TIME:  Fluoroscopy Time:   minute and 0 seconds Radiation Exposure Index (if provided by the fluoroscopic device): 23.4  mGy Number of Acquired Spot Images: 0 COMPARISON:  11/03/2020 CTA chest FINDINGS: An attempted focused single-contrast exam was initiated. The patient was placed in LPO position, as secondary to immobility and morbid obesity, she could not stand upright or be placed in RAO position. With attempted single swallow, no gross narrowing or stricture is identified. Prior to contrast traversing the distal esophagus, the patient exhibited emesis. A second attempt was performed, with immediate emesis as well. IMPRESSION: Extremely limited exam, secondary to episodes of emesis with individual swallows. No gross esophageal stricture identified to the level of the lower thoracic esophagus. Electronically Signed   By: Jeronimo Greaves M.D.   On: 11/18/2020 12:12    Lab Data:  CBC: Recent Labs  Lab 12/09/20 1552 12/10/20 0421 12/11/20 0603 12/12/20 9562 12/13/20 0523 12/14/20 0549 12/15/20 0520  WBC 8.2   < > 6.3 6.2 4.8 4.9 6.7  NEUTROABS 5.7  --   --   --  2.2 2.6  --   HGB 10.9*   < > 9.2* 8.9* 8.8* 9.6* 9.1*  HCT 35.9*   < > 30.0* 29.3* 28.6* 30.9* 29.6*  MCV 97.0   < > 98.4 98.7 99.3 100.3* 98.7  PLT 395   < > 297 284 269 254 300   < > = values in this interval not displayed.   Basic Metabolic Panel: Recent Labs  Lab 12/12/20 0623 12/13/20 0523 12/14/20 0703 12/15/20 0520 12/16/20 0446  NA 144 140 142 141 138  K 4.1 3.8 3.6 3.6 4.2  CL 110 105 105 108 104  CO2 GLUCOSE 95 92 131* 101* 96  BUN 8 6 <5* <5* 5*  CREATININE 0.53 0.49 0.45 0.68 0.55  CALCIUM 8.8* 8.5* 8.7* 9.1 8.6*  MG 1.9  1.9 2.0  --   --   PHOS  --   --  2.9  --   --    GFR: Estimated Creatinine Clearance: 177.6 mL/min (by C-G formula based on SCr of 0.55 mg/dL). Liver Function Tests: Recent Labs  Lab 12/11/20 0603 12/13/20 0523 12/14/20 0703 12/15/20 0520 12/16/20 0446  AST 86* 130* 135* 119* 138*  ALT 45* 51* 53* 45* 33  ALKPHOS 54 50 58 57 51  BILITOT 2.0* 1.6* 1.7* 2.1* 2.4*  PROT 7.4 7.1 7.4 7.5 7.1  ALBUMIN 3.1* 3.0* 3.1* 3.2* 3.1*   Recent Labs  Lab 12/09/20 1552 12/15/20 0520  LIPASE 23 25   No results for input(s): AMMONIA in the last 168 hours. Coagulation Profile: No results for input(s): INR, PROTIME in the last 168 hours. Cardiac Enzymes: No results for input(s): CKTOTAL, CKMB, CKMBINDEX, TROPONINI in the last 168 hours. BNP (last 3 results) No results for input(s): PROBNP in the last 8760 hours. HbA1C: No results for input(s): HGBA1C in the last 72 hours. CBG: Recent Labs  Lab 12/10/20 0832 12/13/20 1104  GLUCAP 93 96   Lipid Profile: No results for input(s): CHOL, HDL, LDLCALC, TRIG, CHOLHDL, LDLDIRECT in the last 72 hours. Thyroid Function Tests: Recent Labs    12/16/20 0446  TSH 7.268*  FREET4 1.32*   Anemia Panel: No results for input(s): VITAMINB12, FOLATE, FERRITIN, TIBC, IRON, RETICCTPCT in the last 72 hours. Urine analysis:    Component Value Date/Time   COLORURINE AMBER (A) 12/11/2020 0037   APPEARANCEUR CLOUDY (A) 12/11/2020 0037   LABSPEC 1.020 12/11/2020 0037   PHURINE 5.0 12/11/2020 0037   GLUCOSEU NEGATIVE 12/11/2020 0037   HGBUR LARGE (A) 12/11/2020 0037  BILIRUBINUR NEGATIVE 12/11/2020 0037   KETONESUR 5 (A) 12/11/2020 0037   PROTEINUR 100 (A) 12/11/2020 0037   UROBILINOGEN 1.0 10/27/2013 2116   NITRITE NEGATIVE 12/11/2020 0037   LEUKOCYTESUR LARGE (A) 12/11/2020 0037     Madeline Pho M.D. [facility] 12/16/2020, 1:40 PM  Available via Epic secure chat 7am-7pm After 7 pm, please refer to night coverage provider listed on  amion.

## 2020-12-16 NOTE — Progress Notes (Signed)
Notified by RN, pt has tingling and numbness in her hands and feet since three days along with tingling around the mouth, tongue pain, and swelling.    Pt seen and examined.  She is alert and oriented, does not appear to be in distress. No new focal deficits, except for paraesthesias in her hands and feet since 3 days,.  She reports taking oxycodone at home for pain.  She reports tongue pain and swelling and unable to swallow.  No open sores in the mouth, pt unable to open her mouth wide, she appears to have very scant white patches on the sides of the tongue, tongue is slightly red.   Recommend: Stopping IV dilaudid and resume home oxycodone in liquid form for pain. Add Neurontin for neuropathic pain. Nystatin suspension for possible oral candidiasis and if no improvement will benefit from Diflucan IV.  Get Vitamin B12, vitamin B1 and folate levels.  Orthostatic vital signs if able .    Kathlen Mody, MD

## 2020-12-17 DIAGNOSIS — R112 Nausea with vomiting, unspecified: Secondary | ICD-10-CM

## 2020-12-17 DIAGNOSIS — K257 Chronic gastric ulcer without hemorrhage or perforation: Secondary | ICD-10-CM

## 2020-12-17 DIAGNOSIS — K2100 Gastro-esophageal reflux disease with esophagitis, without bleeding: Secondary | ICD-10-CM

## 2020-12-17 LAB — COMPREHENSIVE METABOLIC PANEL
ALT: 40 U/L (ref 0–44)
AST: 143 U/L — ABNORMAL HIGH (ref 15–41)
Albumin: 3.1 g/dL — ABNORMAL LOW (ref 3.5–5.0)
Alkaline Phosphatase: 59 U/L (ref 38–126)
Anion gap: 11 (ref 5–15)
BUN: 5 mg/dL — ABNORMAL LOW (ref 6–20)
CO2: 24 mmol/L (ref 22–32)
Calcium: 9.2 mg/dL (ref 8.9–10.3)
Chloride: 108 mmol/L (ref 98–111)
Creatinine, Ser: 0.52 mg/dL (ref 0.44–1.00)
GFR, Estimated: >60 mL/min (ref >60)
Glucose, Bld: 114 mg/dL — ABNORMAL HIGH (ref 70–99)
Potassium: 4.1 mmol/L (ref 3.5–5.1)
Sodium: 143 mmol/L (ref 135–145)
Total Bilirubin: 2.5 mg/dL — ABNORMAL HIGH (ref 0.3–1.2)
Total Protein: 7.5 g/dL (ref 6.5–8.1)

## 2020-12-17 LAB — CBC
HCT: 29.5 % — ABNORMAL LOW (ref 36.0–46.0)
Hemoglobin: 9 g/dL — ABNORMAL LOW (ref 12.0–15.0)
MCH: 30.6 pg (ref 26.0–34.0)
MCHC: 30.5 g/dL (ref 30.0–36.0)
MCV: 100.3 fL — ABNORMAL HIGH (ref 80.0–100.0)
Platelets: 290 10*3/uL (ref 150–400)
RBC: 2.94 MIL/uL — ABNORMAL LOW (ref 3.87–5.11)
RDW: 31.3 % — ABNORMAL HIGH (ref 11.5–15.5)
WBC: 6.5 10*3/uL (ref 4.0–10.5)
nRBC: 0.8 % — ABNORMAL HIGH (ref 0.0–0.2)

## 2020-12-17 LAB — CORTISOL: Cortisol, Plasma: 23.6 ug/dL

## 2020-12-17 LAB — T4, FREE: Free T4: 1.33 ng/dL — ABNORMAL HIGH (ref 0.61–1.12)

## 2020-12-17 MED ORDER — SODIUM CHLORIDE 0.9% FLUSH: 10.0000 mL | INTRAVENOUS | Status: DC | PRN

## 2020-12-17 MED ORDER — CYANOCOBALAMIN 1000 MCG/ML IJ SOLN
1000.0000 ug | Freq: Every day | INTRAMUSCULAR | Status: DC
  Administered 2020-12-17 – 2020-12-25 (×9): 1000 ug via INTRAMUSCULAR
  Filled 2020-12-17 (×9): qty 1

## 2020-12-17 MED ORDER — SODIUM CHLORIDE 0.9% FLUSH
10.0000 mL | Freq: Two times a day (BID) | INTRAVENOUS | Status: DC
  Administered 2020-12-17 – 2020-12-19 (×4): 10 mL
  Administered 2020-12-19: 20 mL
  Administered 2020-12-20 – 2021-01-03 (×26): 10 mL

## 2020-12-17 MED ORDER — FLUCONAZOLE 100MG IVPB
100.0000 mg | Freq: Every day | INTRAVENOUS | Status: AC
  Administered 2020-12-17 – 2020-12-23 (×7): 100 mg via INTRAVENOUS
  Filled 2020-12-17 (×7): qty 50

## 2020-12-17 MED ORDER — MAGIC MOUTHWASH W/LIDOCAINE
5.0000 mL | Freq: Three times a day (TID) | ORAL | Status: DC
  Administered 2020-12-17 – 2021-01-26 (×103): 5 mL via ORAL
  Filled 2020-12-17 (×125): qty 5

## 2020-12-17 MED ORDER — MAGIC MOUTHWASH W/LIDOCAINE: 5.0000 mL | Freq: Four times a day (QID) | ORAL | Status: DC | PRN

## 2020-12-17 NOTE — Progress Notes (Signed)
Attempted PIV in [facility] with ultrasound unsuccessfully.  LUE with small veins, unsuitable for PICCplacement or midline.  R cephalic vein utilized for midline.  Veins small and deep.

## 2020-12-17 NOTE — Progress Notes (Signed)
[facility] ROUNDING NOTE   Subjective: Feeling better, tolerating liquids   Objective: Vital signs in last 24 hours: Temp:  [98 F (36.7 C)-98.7 F (37.1 C)] 98 F (36.7 C) (09/24 0403) Pulse Rate:  [105-106] 105 (09/24 0403) Resp:  [20] 20 (09/24 0403) BP: (113-119)/(72-83) 113/72 (09/24 0403) SpO2:  [96 %-97 %] 97 % (09/24 0403) Weight:  [196.4 kg] 196.4 kg (09/24 0419) Last BM Date:  (PTA) General: NAD, morbidly obese Abdomen: Soft, large pannus, no distention or tenderness.     Intake/Output from previous day: 09/23 0701 - 09/24 0700 In: 5177.6 [I.V.:3706.3; IV Piggyback:1471.3] Out: 600 [Urine:600] Intake/Output this shift: No intake/output data recorded.   Lab Results: Recent Labs    12/15/20 0520 12/17/20 0528  WBC 6.7 6.5  HGB 9.1* 9.0*  PLT 300 290  MCV 98.7 100.3*   BMET Recent Labs    12/15/20 0520 12/16/20 0446 12/17/20 0528  NA 141 138 143  K 3.6 4.2 4.1  CL 108 104 108  CO2 26 22 24   GLUCOSE 101* 96 114*  BUN <5* 5* 5*  CREATININE 0.68 0.55 0.52  CALCIUM 9.1 8.6* 9.2   LFT Recent Labs    12/15/20 0520 12/16/20 0446 12/17/20 0528  PROT 7.5 7.1 7.5  ALBUMIN 3.2* 3.1* 3.1*  AST 119* 138* 143*  ALT 45* 33 40  ALKPHOS 57 51 59  BILITOT 2.1* 2.4* 2.5*   PT/INR No results for input(s): INR in the last 72 hours.    Imaging/Other results: NM Hepato W/EF  Result Date: 12/16/2020 CLINICAL DATA:  Nausea and vomiting. Gallbladder sludge. Elevated bilirubin EXAM: NUCLEAR MEDICINE HEPATOBILIARY IMAGING WITH GALLBLADDER EF TECHNIQUE: Sequential images of the abdomen were obtained out to 60 minutes following intravenous administration of radiopharmaceutical. After oral ingestion of Ensure, gallbladder ejection fraction was determined. At 60 min, normal ejection fraction is greater than 33%. RADIOPHARMACEUTICALS:  7.2 mCi Tc-21m  Choletec IV COMPARISON:  Ultrasound 12/13/2020, CT 12/09/2020 FINDINGS: There is prompt clearance  radiotracer from the blood pool but some delay in clearance into the biliary tree presumably related to elevated bilirubin. The gallbladder fills by 60 minutes. Small amount of counts are present in the small bowel. Fatty meal was administered and the gallbladder contracted near completely prior to re-imaging. Calculated gallbladder ejection fraction is 99%. (Normal gallbladder ejection fraction with Ensure is greater than 33%.) IMPRESSION: 1. Patent cystic duct and common bile duct. Filling of the gallbladder. 2. Rapid emptying of the gallbladder.  99% ejection fraction. 3. Slow transient radiotracer into the biliary tree and small bowel related to elevated bilirubin. Electronically Signed   By: 12/11/2020 M.D.   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  Recent EGD negative for significant pathology, she had a small antral erosion and mild esophagitis.  Limit or avoid use of opiates Advance diet as tolerated Continue pantoprazole twice daily  Use Magic mouthwash with nystatin and lidocaine for ?oral discomfort  Continue bowel regimen to prevent constipation  Dr. 38 will take over care on Monday if she remains hospitalized  Please call with any questions  K. Wednesday , MD [phone]  [facility]

## 2020-12-17 NOTE — Progress Notes (Signed)
[facility]                                                                              Patient Demographics  Carla Little, is a 30 y.o. female, DOB - 1990/04/26, EPP:[id number]  Admit date - 12/09/2020   Admitting Physician No admitting provider for patient encounter.  Outpatient Primary MD for the patient is Patient, No Pcp Per (Inactive)  Outpatient specialists:   LOS - 7  days   Medical records reviewed and are as summarized below:    Chief Complaint  Patient presents with   Emesis   Nausea       Brief summary   Patient is a 30 year old female with morbid obesity, nonalcoholic fatty liver disease, GERD, depression, recently was hospitalized following a mechanical fall with a left knee dislocation, resultant MCL strain, ACL and PCL rupture then complicated by nausea vomiting, tachycardia, UTI and fecal impaction during the hospitalization. Patient had an endoscopy which noted small antral ulcer on 8/27, RUQ ultrasound was unremarkable.  Patient presented to ED with multiple episodes of nausea, vomiting, aches and pains everywhere. In ED, afebrile, mildly tachycardic, hypertensive.  Labs showed mildly abnormal AST, ALT, bilirubin of 2.  Assessment & Plan    Principal Problem:   Intractable vomiting with nausea -Possibly due to gastroparesis, esophagitis -Patient noted to have prolonged course of nausea and vomiting during recent hospitalization -EGD 11/19/2020 had shown small antral ulcer.  CT abdomen and pelvis with hepatomegaly fatty liver disease, no ductal dilatation or gallstones. -Abd [place] 9/20 showed gallbladder distended with sludge but no cholelithiasis, no wall thickening or pericholecystic fluid -CT angio abd-pelvis on 9/16 showed no vascular pathology, no acute localizing process in abdomen and pelvis.  Hepatomegaly with hepatic steatosis -HIDA scan negative -Stopped IV Ancef and Flagyl, reglan.  Added nystatin and lidocaine Magic mouthwash  with meals for throat pain -Given prolonged course and antibiotics use, will give 7 days of Diflucan.  Reports improving, tolerating full liquid diet.  Paresthesias due to B12 deficiency, folate deficiency -No angioedema, given 1 dose of IV Solu-Medrol, Pepcid, Benadryl due to patient's complaint of difficulty eating, tongue swelling and paresthesias.  Symptoms improving today. -Cortisol level normal, TSH 5.2, B12 very low of 153, folate 3.2 -Started on B12 injections IM until patient has consistent oral diet and able to tolerate p.o., continue folic acid 1 mg daily -Placed on Neurontin 100 mg 3 times daily for possible neuropathy  E. coli UTI -Urine culture showed more than 100,000 colonies of E. coli and 20,000 colonies of Proteus mirabilis -IV Ancef and Flagyl discontinued, no complaints of dysuria hematuria or fevers.  Recent left knee dislocation -Followed by orthopedics, appreciate recommendations by Dr. Magnus Ivan  -Recommended continue brace for another 2 to 4 weeks  -PT recommended SNF  Sinus tachycardia -Continue beta-blocker  Hypothyroidism -TSH 11.8 on 11/07/2020, T4 0.9 -Follow TSH, free T4  GERD -Continue PPI  Iron deficiency anemia -History of heavy menorrhagia on her cycle, - H&H currently stable  Morbid obesity Estimated body mass index is 74.32 kg/m as calculated from the following:   Height as of this encounter: 5\' 4"  (1.626  m).   Weight as of this encounter: 196.4 kg.  Code Status: Full DVT Prophylaxis:  SCDs Start: 12/09/20 1937   Level of Care: Level of care: Telemetry Family Communication: Discussed all imaging results, lab results, explained to the patient   Disposition Plan:     Status is: Inpatient  Remains inpatient appropriate because:Inpatient level of care appropriate due to severity of illness  Dispo: The patient is from: Home              Anticipated d/c is to: SNF              Patient currently is not medically stable to d/c.      Difficult to place patient No   Time Spent in minutes: 35 minutes Procedures:  None  Consultants:   Gastroenterology  Antimicrobials:   Anti-infectives (From admission, onward)    Start     Dose/Rate Route Frequency Ordered Stop   12/17/20 1100  fluconazole (DIFLUCAN) IVPB 100 mg        100 mg 50 mL/hr over 60 Minutes Intravenous Daily 12/17/20 1046 12/24/20 0959   12/13/20 2200  metroNIDAZOLE (FLAGYL) IVPB 500 mg  Status:  Discontinued        500 mg 100 mL/hr over 60 Minutes Intravenous Every 8 hours 12/13/20 2107 12/16/20 1620   12/13/20 1000  ceFAZolin (ANCEF) IVPB 2g/100 mL premix  Status:  Discontinued        2 g 200 mL/hr over 30 Minutes Intravenous Every 8 hours 12/13/20 0927 12/16/20 1721   12/12/20 1000  cefTRIAXone (ROCEPHIN) 2 g in sodium chloride 0.9 % 100 mL IVPB  Status:  Discontinued        2 g 200 mL/hr over 30 Minutes Intravenous Every 24 hours 12/12/20 0901 12/13/20 0928   12/11/20 0845  cefTRIAXone (ROCEPHIN) 1 g in sodium chloride 0.9 % 100 mL IVPB  Status:  Discontinued        1 g 200 mL/hr over 30 Minutes Intravenous Every 24 hours 12/11/20 0758 12/12/20 0901          Medications  Scheduled Meds:  bisacodyl  10 mg Rectal Once   ciprofloxacin  2 drop Right Eye Q4H while awake   cyanocobalamin  1,000 mcg Intramuscular Daily   folic acid  1 mg Oral Daily   gabapentin  100 mg Oral Q8H   magic mouthwash w/lidocaine  5 mL Oral TID AC   metoprolol tartrate  10 mg Intravenous Q6H   nystatin  5 mL Oral QID   pantoprazole (PROTONIX) IV  40 mg Intravenous Q12H   prochlorperazine  10 mg Intravenous Q8H   senna-docusate  1 tablet Oral BID   simethicone  160 mg Oral QID   Continuous Infusions:  fluconazole (DIFLUCAN) IV 100 mg (12/17/20 1147)   ondansetron (ZOFRAN) IV 8 mg (12/15/20 1750)   PRN Meds:.acetaminophen **OR** acetaminophen, diphenhydrAMINE, HYDROmorphone (DILAUDID) injection, menthol-cetylpyridinium, ondansetron **OR** ondansetron (ZOFRAN)  IV, oxyCODONE, phenol      Subjective:   Carla Little was seen and examined today.  States paresthesias are improving, throat and tongue feeling better after starting nystatin.  Was able to tolerate drinking fluids last night.  No fevers or chills.  No abdominal pain.  No ongoing vomiting  Objective:   Vitals:   12/16/20 1420 12/17/20 0403 12/17/20 0419 12/17/20 1125  BP: 119/83 113/72  124/84  Pulse: (!) 106 (!) 105  (!) 107  Resp:  20    Temp: 98.7 F (37.1 C) 98  F (36.7 C)    TempSrc:      SpO2: 96% 97%    Weight:   (!) 196.4 kg   Height:        Intake/Output Summary (Last 24 hours) at 12/17/2020 1255 Last data filed at 12/17/2020 0932 Gross per 24 hour  Intake 5657.59 ml  Output 600 ml  Net 5057.59 ml      Wt Readings from Last 3 Encounters:  12/17/20 (!) 196.4 kg  11/01/20 (!) 181.4 kg  10/27/13 (!) 161.9 kg    Physical Exam General: Alert and oriented x 3, NAD, in better spirits today Cardiovascular: S1 S2 clear, RRR. No pedal edema b/l Respiratory: CTAB, Gastrointestinal: Obese, soft, nontender, nondistended, NBS Ext: no pedal edema bilaterally Psych: Normal affect and demeanor, alert and oriented x3       Data Reviewed:  I have personally reviewed following labs and imaging studies  Micro Results Recent Results (from the past 240 hour(s))  Resp Panel by RT-PCR (Flu A&B, Covid) Nasopharyngeal Swab     Status: None   Collection Time: 12/09/20  8:10 PM   Specimen: Nasopharyngeal Swab; Nasopharyngeal(NP) swabs in vial transport medium  Result Value Ref Range Status   SARS Coronavirus 2 by RT PCR NEGATIVE NEGATIVE Final    Comment: (NOTE) SARS-CoV-2 target nucleic acids are NOT DETECTED.  The SARS-CoV-2 RNA is generally detectable in upper respiratory specimens during the acute phase of infection. The lowest concentration of SARS-CoV-2 viral copies this assay can detect is 138 copies/mL. A negative result does not preclude  SARS-Cov-2 infection and should not be used as the sole basis for treatment or other patient management decisions. A negative result may occur with  improper specimen collection/handling, submission of specimen other than nasopharyngeal swab, presence of viral mutation(s) within the areas targeted by this assay, and inadequate number of viral copies(<138 copies/mL). A negative result must be combined with clinical observations, patient history, and epidemiological information. The expected result is Negative.  Fact Sheet for Patients:  BloggerCourse.com  Fact Sheet for Healthcare Providers:  SeriousBroker.it  This test is no t yet approved or cleared by the [place] FDA and  has been authorized for detection and/or diagnosis of SARS-CoV-2 by FDA under an Emergency Use Authorization (EUA). This EUA will remain  in effect (meaning this test can be used) for the duration of the COVID-19 declaration under Section 564(b)(1) of the Act, 21 U.S.C.section 360bbb-3(b)(1), unless the authorization is terminated  or revoked sooner.       Influenza A by PCR NEGATIVE NEGATIVE Final   Influenza B by PCR NEGATIVE NEGATIVE Final    Comment: (NOTE) The Xpert Xpress SARS-CoV-2/FLU/RSV plus assay is intended as an aid in the diagnosis of influenza from Nasopharyngeal swab specimens and should not be used as a sole basis for treatment. Nasal washings and aspirates are unacceptable for Xpert Xpress SARS-CoV-2/FLU/RSV testing.  Fact Sheet for Patients: BloggerCourse.com  Fact Sheet for Healthcare Providers: SeriousBroker.it  This test is not yet approved or cleared by the [place] FDA and has been authorized for detection and/or diagnosis of SARS-CoV-2 by FDA under an Emergency Use Authorization (EUA). This EUA will remain in effect (meaning this test can be used) for the duration of  the COVID-19 declaration under Section 564(b)(1) of the Act, 21 U.S.C. section 360bbb-3(b)(1), unless the authorization is terminated or revoked.  Performed at [facility], [address], [place], [place] [address]   Urine Culture     Status:  Abnormal   Collection Time: 12/11/20 12:37 AM   Specimen: Urine, Random  Result Value Ref Range Status   Specimen Description   Final    URINE, RANDOM Performed at [facility], [address] [place], [place] [address]    Special Requests   Final    NONE Performed at [facility], [address], [place], [place] [address]    Culture (A)  Final    >=100,000 COLONIES/mL ESCHERICHIA COLI 20,000 COLONIES/mL PROTEUS MIRABILIS    Report Status 12/13/2020 FINAL  Final   Organism ID, Bacteria ESCHERICHIA COLI (A)  Final   Organism ID, Bacteria PROTEUS MIRABILIS (A)  Final      Susceptibility   Escherichia coli - MIC*    AMPICILLIN >=32 RESISTANT Resistant     CEFAZOLIN <=4 SENSITIVE Sensitive     CEFEPIME <=0.12 SENSITIVE Sensitive     CEFTRIAXONE <=0.25 SENSITIVE Sensitive     CIPROFLOXACIN <=0.25 SENSITIVE Sensitive     GENTAMICIN <=1 SENSITIVE Sensitive     IMIPENEM <=0.25 SENSITIVE Sensitive     NITROFURANTOIN 32 SENSITIVE Sensitive     TRIMETH/SULFA <=20 SENSITIVE Sensitive     AMPICILLIN/SULBACTAM 16 INTERMEDIATE Intermediate     PIP/TAZO <=4 SENSITIVE Sensitive     * >=100,000 COLONIES/mL ESCHERICHIA COLI   Proteus mirabilis - MIC*    AMPICILLIN <=2 SENSITIVE Sensitive     CEFAZOLIN <=4 SENSITIVE Sensitive     CEFEPIME <=0.12 SENSITIVE Sensitive     CEFTRIAXONE <=0.25 SENSITIVE Sensitive     CIPROFLOXACIN <=0.25 SENSITIVE Sensitive     GENTAMICIN <=1 SENSITIVE Sensitive     IMIPENEM 2 SENSITIVE Sensitive     NITROFURANTOIN RESISTANT Resistant     TRIMETH/SULFA <=20 SENSITIVE Sensitive     AMPICILLIN/SULBACTAM <=2 SENSITIVE Sensitive     PIP/TAZO <=4 SENSITIVE  Sensitive     * 20,000 COLONIES/mL PROTEUS MIRABILIS    Radiology Reports DG Chest 1 View  Result Date: 12/11/2020 CLINICAL DATA:  Shortness of breath EXAM: CHEST  1 VIEW COMPARISON:  05/16/2018. FINDINGS: Evaluation is limited by body habitus and positioning. Low lung volumes. The heart is likely normal in size, given projection. No focal pulmonary opacity. No definite pleural effusion or pneumothorax. No acute osseous abnormality. IMPRESSION: Evaluation is limited by body habitus, positioning, and low lung volumes. Within this limitation, no acute cardiopulmonary process. Electronically Signed   By: Wiliam Ke M.D.   On: 12/11/2020 21:48   US Abdomen Complete  Result Date: 12/13/2020 CLINICAL DATA:  Elevated LFTs and nausea and vomiting EXAM: ABDOMEN ULTRASOUND COMPLETE COMPARISON:  Ultrasound from 11/20/2020 and CT from 12/09/2020 FINDINGS: Gallbladder: Gallbladder is well distended with evidence of sludge. No cholelithiasis is noted. No wall thickening or pericholecystic fluid is noted. Common bile duct: Diameter: 3 mm Liver: Diffuse increased echogenicity is noted without focal mass consistent with fatty infiltration. This is similar to that seen on prior CT examination. Portal vein is patent on color Doppler imaging with normal direction of blood flow towards the liver. IVC: No abnormality visualized. Pancreas: Visualized portion unremarkable. Spleen: Size and appearance within normal limits. Right Kidney: Length: 13.9 cm. Echogenicity within normal limits. No mass or hydronephrosis visualized. Left Kidney: Length: 12.4 cm. Echogenicity within normal limits. No mass or hydronephrosis visualized. Abdominal aorta: No aneurysm visualized. Other findings: None. IMPRESSION: Gallbladder sludge. Fatty infiltration of the liver. No other focal abnormality is noted. Electronically Signed   By: Alcide Clever M.D.   On: 12/13/2020 20:30  NM Hepato W/EF  Result Date: 12/16/2020 CLINICAL DATA:  Nausea and  vomiting. Gallbladder sludge. Elevated bilirubin EXAM: NUCLEAR MEDICINE HEPATOBILIARY IMAGING WITH GALLBLADDER EF TECHNIQUE: Sequential images of the abdomen were obtained out to 60 minutes following intravenous administration of radiopharmaceutical. After oral ingestion of Ensure, gallbladder ejection fraction was determined. At 60 min, normal ejection fraction is greater than 33%. RADIOPHARMACEUTICALS:  7.2 mCi Tc-18m  Choletec IV COMPARISON:  Ultrasound 12/13/2020, CT 12/09/2020 FINDINGS: There is prompt clearance radiotracer from the blood pool but some delay in clearance into the biliary tree presumably related to elevated bilirubin. The gallbladder fills by 60 minutes. Small amount of counts are present in the small bowel. Fatty meal was administered and the gallbladder contracted near completely prior to re-imaging. Calculated gallbladder ejection fraction is 99%. (Normal gallbladder ejection fraction with Ensure is greater than 33%.) IMPRESSION: 1. Patent cystic duct and common bile duct. Filling of the gallbladder. 2. Rapid emptying of the gallbladder.  99% ejection fraction. 3. Slow transient radiotracer into the biliary tree and small bowel related to elevated bilirubin. Electronically Signed   By: Genevive Bi M.D.   On: 12/16/2020 12:27   DG Knee Left Port  Result Date: 12/13/2020 CLINICAL DATA:  History of dislocation of knee. EXAM: PORTABLE LEFT KNEE - 1-2 VIEW COMPARISON:  Most recent radiograph 11/21/2020 FINDINGS: Proximal fibular fracture is less well-defined on the current exam, unclear if this is related to differences in technique or interval healing. There is a small fracture fragment projects lateral to the lateral tibial plateau. Fracture fragment arising from the medial femoral condyle is faintly visualized. Narrowing of the medial and widening of the lateral tibiofemoral compartments. Patellofemoral relationship is not well-defined on the current exam due to habitus and  positioning. IMPRESSION: 1. Proximal fibular fracture is less well-defined on the current exam, unclear if this is related to differences in technique or interval healing. Small fracture fragment projects lateral to the lateral tibial plateau. 2. Medial femoral condyle fracture fragment is faintly visualized. 3. Narrowing of the medial and widening of the lateral tibiofemoral compartments, possibly due to ligamentous injury. Electronically Signed   By: Narda Rutherford M.D.   On: 12/13/2020 10:36   DG Knee Left Port  Result Date: 11/21/2020 CLINICAL DATA:  Knee injury, fall a couple weeks ago EXAM: PORTABLE LEFT KNEE - 1-2 VIEW COMPARISON:  None. FINDINGS: No evidence of fracture, dislocation, or joint effusion. No evidence of arthropathy or other focal bone abnormality. Soft tissues are unremarkable. IMPRESSION: No fracture or dislocation of the left knee. Joint spaces are preserved. Electronically Signed   By: Lauralyn Primes M.D.   On: 11/21/2020 19:15   DG ABD ACUTE 2+V W 1V CHEST  Result Date: 12/13/2020 CLINICAL DATA:  Nausea and vomiting. EXAM: DG ABDOMEN ACUTE WITH 1 VIEW CHEST COMPARISON:  CT 12/09/2020 FINDINGS: Evaluation is limited due to body habitus. Shallow inspiration with elevation of the right hemidiaphragm. Linear areas of infiltration or atelectasis in the right mid and lower lungs. Left lung is clear. Heart size and pulmonary vascularity are normal. No free air under the hemidiaphragms. Gas and stool throughout the colon. No small or large bowel distention is appreciated. No radiopaque stones are identified. IMPRESSION: 1. Shallow inspiration with elevation of the right hemidiaphragm. Infiltration or atelectasis in the right lung. Possibly pneumonia. 2. Nonobstructive bowel gas pattern with gas-filled colon. Electronically Signed   By: Burman Nieves M.D.   On: 12/13/2020 20:11   VAS [place] LOWER EXTREMITY VENOUS (DVT)  Result Date: 11/18/2020  Lower Venous DVT Study Patient Name:   ASHLINN HEMRICK  Date of Exam:   11/18/2020 Medical Rec #: [id number]          Accession #:    [id number] Date of Birth: 05-03-1990         Patient Gender: F Patient Age:   70 years Exam Location:  [facility] Procedure:      VAS [place] LOWER EXTREMITY VENOUS (DVT) Referring Phys: STEPHEN CHIU --------------------------------------------------------------------------------  Indications: Pain.  Risk Factors: Trauma. Limitations: Body habitus and poor ultrasound/tissue interface. Comparison Study: No prior studies. Performing Technologist: Chanda Busing RVT  Examination Guidelines: A complete evaluation includes B-mode imaging, spectral Doppler, color Doppler, and power Doppler as needed of all accessible portions of each vessel. Bilateral testing is considered an integral part of a complete examination. Limited examinations for reoccurring indications may be performed as noted. The reflux portion of the exam is performed with the patient in reverse Trendelenburg.  +-----+---------------+---------+-----------+----------+--------------+ RIGHTCompressibilityPhasicitySpontaneityPropertiesThrombus Aging +-----+---------------+---------+-----------+----------+--------------+ CFV  Full           Yes      Yes                                 +-----+---------------+---------+-----------+----------+--------------+   +---------+---------------+---------+-----------+----------+-------------------+ LEFT     CompressibilityPhasicitySpontaneityPropertiesThrombus Aging      +---------+---------------+---------+-----------+----------+-------------------+ CFV      Full           Yes      Yes                                      +---------+---------------+---------+-----------+----------+-------------------+ SFJ      Full                                                             +---------+---------------+---------+-----------+----------+-------------------+ FV Prox  Full                                                              +---------+---------------+---------+-----------+----------+-------------------+ FV Mid   Full                                                             +---------+---------------+---------+-----------+----------+-------------------+ FV Distal               Yes      Yes                                      +---------+---------------+---------+-----------+----------+-------------------+ PFV      Full                                                             +---------+---------------+---------+-----------+----------+-------------------+  POP      Full           Yes      Yes                                      +---------+---------------+---------+-----------+----------+-------------------+ PTV      Full                                                             +---------+---------------+---------+-----------+----------+-------------------+ PERO                                                  Not well visualized +---------+---------------+---------+-----------+----------+-------------------+    Summary: RIGHT: - No evidence of common femoral vein obstruction.  LEFT: - There is no evidence of deep vein thrombosis in the lower extremity. However, portions of this examination were limited- see technologist comments above.  - No cystic structure found in the popliteal fossa.  *See table(s) above for measurements and observations. Electronically signed by Sherald Hess MD on 11/18/2020 at 2:43:02 PM.    Final    CT Angio Abd/Pel W and/or Wo Contrast  Result Date: 12/09/2020 CLINICAL DATA:  Concern for chronic mesenteric ischemia. EXAM: CTA ABDOMEN AND PELVIS WITHOUT AND WITH CONTRAST TECHNIQUE: Multidetector CT imaging of the abdomen and pelvis was performed using the standard protocol during bolus administration of intravenous contrast. Multiplanar reconstructed images and MIPs were obtained and reviewed to evaluate the  vascular anatomy. CONTRAST:  OMNIPAQUE IOHEXOL 350 MG/ML SOLN COMPARISON:  CT abdomen and pelvis 06/08/2016. FINDINGS: VASCULAR Aorta: Normal caliber aorta without aneurysm, dissection, vasculitis or significant stenosis. Celiac: Patent without evidence of aneurysm, dissection, vasculitis or significant stenosis. SMA: Patent without evidence of aneurysm, dissection, vasculitis or significant stenosis. Renals: Both renal arteries are patent without evidence of aneurysm, dissection, vasculitis, fibromuscular dysplasia or significant stenosis. IMA: Patent without evidence of aneurysm, dissection, vasculitis or significant stenosis. Inflow: Patent without evidence of aneurysm, dissection, vasculitis or significant stenosis. Proximal Outflow: Bilateral common femoral and visualized portions of the superficial and profunda femoral arteries are patent without evidence of aneurysm, dissection, vasculitis or significant stenosis. Veins: No obvious venous abnormality within the limitations of this arterial phase study. Review of the MIP images confirms the above findings. NON-VASCULAR Lower chest: No acute abnormality. Hepatobiliary: Liver is enlarged. There is diffuse fatty infiltration of the liver. No calcified gallstones are seen. There is no biliary ductal dilatation identified. Pancreas: Unremarkable. No pancreatic ductal dilatation or surrounding inflammatory changes. Spleen: Normal in size without focal abnormality. Adrenals/Urinary Tract: Adrenal glands are unremarkable. Kidneys are normal, without renal calculi, focal lesion, or hydronephrosis. Bladder is unremarkable. Stomach/Bowel: Stomach is within normal limits. Appendix appears normal. No evidence of bowel wall thickening, distention, or inflammatory changes. Lymphatic: No significant vascular findings are present. No enlarged abdominal or pelvic lymph nodes. Reproductive: Uterus and bilateral adnexa are unremarkable. Other: No abdominal wall hernia or  abnormality. No abdominopelvic ascites. Musculoskeletal: No acute or significant osseous findings. IMPRESSION: VASCULAR 1. No acute vascular pathology identified. NON-VASCULAR 1. No acute localizing process in the  abdomen or pelvis. 2. Hepatomegaly and hepatic steatosis. Electronically Signed   By: Darliss Cheney M.D.   On: 12/09/2020 19:22   US Abdomen Limited RUQ (LIVER/GB)  Result Date: 11/20/2020 CLINICAL DATA:  Cholelithiasis. EXAM: ULTRASOUND ABDOMEN LIMITED RIGHT UPPER QUADRANT COMPARISON:  None. FINDINGS: Gallbladder: No gallstones or wall thickening visualized (2.5 mm). No sonographic Murphy sign noted by sonographer. Common bile duct: Diameter: 5.1 mm Liver: No focal lesion identified. Within normal limits in parenchymal echogenicity. Portal vein is patent on color Doppler imaging with normal direction of blood flow towards the liver. Other: None. IMPRESSION: Normal right upper quadrant ultrasound. Electronically Signed   By: Aram Candela M.D.   On: 11/20/2020 00:54   DG ESOPHAGUS W SINGLE CM (SOL OR THIN BA)  Result Date: 11/18/2020 CLINICAL DATA:  Emesis with attempted taking of pills. Nausea. Gagging. EXAM: ESOPHOGRAM/BARIUM SWALLOW TECHNIQUE: Single contrast examination was performed using  thin barium. FLUOROSCOPY TIME:  Fluoroscopy Time:   minute and 0 seconds Radiation Exposure Index (if provided by the fluoroscopic device): 23.4 mGy Number of Acquired Spot Images: 0 COMPARISON:  11/03/2020 CTA chest FINDINGS: An attempted focused single-contrast exam was initiated. The patient was placed in LPO position, as secondary to immobility and morbid obesity, she could not stand upright or be placed in RAO position. With attempted single swallow, no gross narrowing or stricture is identified. Prior to contrast traversing the distal esophagus, the patient exhibited emesis. A second attempt was performed, with immediate emesis as well. IMPRESSION: Extremely limited exam, secondary to episodes of  emesis with individual swallows. No gross esophageal stricture identified to the level of the lower thoracic esophagus. Electronically Signed   By: Jeronimo Greaves M.D.   On: 11/18/2020 12:12    Lab Data:  CBC: Recent Labs  Lab 12/12/20 0623 12/13/20 0523 12/14/20 0549 12/15/20 0520 12/17/20 0528  WBC 6.2 4.8 4.9 6.7 6.5  NEUTROABS  --  2.2 2.6  --   --   HGB 8.9* 8.8* 9.6* 9.1* 9.0*  HCT 29.3* 28.6* 30.9* 29.6* 29.5*  MCV 98.7 99.3 100.3* 98.7 100.3*  PLT 284 269 254 300 290   Basic Metabolic Panel: Recent Labs  Lab 12/12/20 0623 12/13/20 0523 12/14/20 0703 12/15/20 0520 12/16/20 0446 12/17/20 0528  NA 144 140 142 141 138 143  K 4.1 3.8 3.6 3.6 4.2 4.1  CL 110 105 105 108 104 108  CO2 25 28 26 26 22 24   GLUCOSE 95 92 131* 101* 96 114*  BUN 8 6 <5* <5* 5* 5*  CREATININE 0.53 0.49 0.45 0.68 0.55 0.52  CALCIUM 8.8* 8.5* 8.7* 9.1 8.6* 9.2  MG 1.9 1.9 2.0  --   --   --   PHOS  --   --  2.9  --   --   --    GFR: Estimated Creatinine Clearance: 179.2 mL/min (by C-G formula based on SCr of 0.52 mg/dL). Liver Function Tests: Recent Labs  Lab 12/13/20 0523 12/14/20 0703 12/15/20 0520 12/16/20 0446 12/17/20 0528  AST 130* 135* 119* 138* 143*  ALT 51* 53* 45* 33 40  ALKPHOS 50 58 57 51 59  BILITOT 1.6* 1.7* 2.1* 2.4* 2.5*  PROT 7.1 7.4 7.5 7.1 7.5  ALBUMIN 3.0* 3.1* 3.2* 3.1* 3.1*   Recent Labs  Lab 12/15/20 0520  LIPASE 25   No results for input(s): AMMONIA in the last 168 hours. Coagulation Profile: No results for input(s): INR, PROTIME in the last 168 hours. Cardiac Enzymes: No results for  input(s): CKTOTAL, CKMB, CKMBINDEX, TROPONINI in the last 168 hours. BNP (last 3 results) No results for input(s): PROBNP in the last 8760 hours. HbA1C: No results for input(s): HGBA1C in the last 72 hours. CBG: Recent Labs  Lab 12/13/20 1104  GLUCAP 96   Lipid Profile: No results for input(s): CHOL, HDL, LDLCALC, TRIG, CHOLHDL, LDLDIRECT in the last 72  hours. Thyroid Function Tests: Recent Labs    12/16/20 1900  TSH 5.227*  FREET4 1.33*   Anemia Panel: Recent Labs    12/16/20 1900  VITAMINB12 153*  FOLATE 3.2*   Urine analysis:    Component Value Date/Time   COLORURINE AMBER (A) 12/11/2020 0037   APPEARANCEUR CLOUDY (A) 12/11/2020 0037   LABSPEC 1.020 12/11/2020 0037   PHURINE 5.0 12/11/2020 0037   GLUCOSEU NEGATIVE 12/11/2020 0037   HGBUR LARGE (A) 12/11/2020 0037   BILIRUBINUR NEGATIVE 12/11/2020 0037   KETONESUR 5 (A) 12/11/2020 0037   PROTEINUR 100 (A) 12/11/2020 0037   UROBILINOGEN 1.0 10/27/2013 2116   NITRITE NEGATIVE 12/11/2020 0037   LEUKOCYTESUR LARGE (A) 12/11/2020 0037     Lanique Gonzalo M.D. [facility] 12/17/2020, 12:55 PM  Available via Epic secure chat 7am-7pm After 7 pm, please refer to night coverage provider listed on amion.

## 2020-12-17 NOTE — Progress Notes (Signed)
Pt did not tolerate PO pill med at 2100 med pass, did tolerate liquid PO meds well. Pt stated she we unable to swallow pill meds during the day as well.

## 2020-12-18 MED ORDER — FOLIC ACID 5 MG/ML IJ SOLN
1.0000 mg | Freq: Every day | INTRAMUSCULAR | Status: DC
  Administered 2020-12-18 – 2020-12-25 (×8): 1 mg via INTRAVENOUS
  Filled 2020-12-18 (×8): qty 0.2

## 2020-12-18 NOTE — Progress Notes (Signed)
Occupational Therapy Treatment Patient Details Name: Carla Little MRN: [id number] DOB: 10-Aug-1990 Today's Date: 12/18/2020   History of present illness Patient is a 30 y.o. female who presented to [facility] for intractable N/V on 9/17. ED labwork reveals e. coli UTI. Pt had recent hospital admission from 8/8-9/5 due to Lt knee dislocation which was reduced with fractures of the proximal fibula ligamentous avulsion laterally as well as medially off the medial femoral condyle and MRI showed complete ACL & PCL tears and MCL strain. Patient was placed in knee immobilizer throughout duration of admission with no knee flexion permitted. That hospital admission was complicated by nausea, vomiting, tachycardia, UTI, and fecal impaction; she was treated for nausea and vomiting with supportive care, had an endoscopy which noted a small antral ulcer on 8/27. As of 8/13 she was placed in a Lt Bledsoe brace with WBAT orders and no flexion of knee. PMH significant for morbid obesity, GERD, fatty liver disease, depression.   OT comments  Treatment focused on assessing upper extremities as nursing reports new difficulties with using arms to grasp and hold things and to attempt any movement to promote strength and activity tolerance. Patient supine in bed and awake. The TV is off and she is not on her phone. Patient reports pins and needles feeling throughout her arms and not being able to use her hands effectively to feed herself or use her cell phone. Therapist evaluated ROM and strength of both arms - with the RUE exhibiting functional ROM - shoulder 4/5, elbow 4-/5 wrist 4/5 and grip 3+/5. LUE a little weaker but still functional. With initial attempts to test strength patient not putting forth enough effort but with firm commands patient able to improve resistance. Patient able to perform finger to thumb in each hand and finger to nose but when therapist had her reach out to finger she exhibited some dysmetria or  difficulty with movements - though movements controlled. She demonstrated ability to bring cup to mouth and grossly wash her face and use her arms functionally with bed mobility. There is a question of a psychosomatic component at play here.  Patient agreeable to attempt to sit up and she was mod assist to come into sitting but did not make it to the edge of the bed due to complaints of back pain. Patient assisted with transfers using bed rails.    Recommendations for follow up therapy are one component of a multi-disciplinary discharge planning process, led by the attending physician.  Recommendations may be updated based on patient status, additional functional criteria and insurance authorization.    Follow Up Recommendations  SNF    Equipment Recommendations  None recommended by OT    Recommendations for Other Services      Precautions / Restrictions Precautions Precautions: Fall Precaution Comments: PT Orders from Maureen Ralphs, MD; "WBAT Left LE, only up with a walker, does not need brace at this standpoint." Also in his note says if probalems with mobility - use brace again. Required Braces or Orthoses: Knee Immobilizer - Left Knee Immobilizer - Left: On at all times       Mobility Bed Mobility Overal bed mobility: Needs Assistance Bed Mobility: Supine to Sit;Sit to Supine     Supine to sit: Mod assist;HOB elevated Sit to supine: Mod assist   General bed mobility comments: Attenpted sit to stand. Patient assisting with bed rails. Mod assist for trunk and assisting with LEs. able to get in to sitting but not at edge of  bed. Tolerated approx 30 seconds before returning to supine due to complaints of back spasms/"muscle pulled" feeling. Mod assist for LEs to return to supne.    Transfers                      Balance       Sitting balance - Comments: reliant on UE support                                   ADL either performed or assessed with  clinical judgement   ADL Overall ADL's : Needs assistance/impaired Eating/Feeding: Set up Eating/Feeding Details (indicate cue type and reason): able to grasp and hold cup and bring to mouth Grooming: Set up Grooming Details (indicate cue type and reason): able to grossly wash face                                     Vision Patient Visual Report: No change from baseline     Perception     Praxis      Cognition Arousal/Alertness: Awake/alert Behavior During Therapy: Flat affect Overall Cognitive Status: Within Functional Limits for tasks assessed                                          Exercises     Shoulder Instructions       General Comments      Pertinent Vitals/ Pain       Pain Assessment: Faces Faces Pain Scale: Hurts even more Pain Location: LLE, back, RUE Pain Descriptors / Indicators: Discomfort;Grimacing;Guarding;Pins and needles;Sore;Spasm Pain Intervention(s): Monitored during session;Premedicated before session  Home Living                                          Prior Functioning/Environment              Frequency  Min 2X/week        Progress Toward Goals  OT Goals(current goals can now be found in the care plan section)  Progress towards OT goals: Not progressing toward goals - comment (new reports of sensation deficits and weakness)  Acute Rehab OT Goals Patient Stated Goal: to get stronger OT Goal Formulation: With patient Time For Goal Achievement: 12/28/20 Potential to Achieve Goals: Fair  Plan Discharge plan remains appropriate    Co-evaluation          OT goals addressed during session: Strengthening/ROM;Other (comment) (functional mobility)      AM-PAC OT "6 Clicks" Daily Activity     Outcome Measure   Help from another person eating meals?: A Little Help from another person taking care of personal grooming?: A Little Help from another person toileting, which  includes using toliet, bedpan, or urinal?: Total Help from another person bathing (including washing, rinsing, drying)?: A Lot Help from another person to put on and taking off regular upper body clothing?: A Lot Help from another person to put on and taking off regular lower body clothing?: Total 6 Click Score: 12    End of Session    OT Visit Diagnosis: Other abnormalities of gait and mobility (R26.89);History  of falling (Z91.81);Pain;Muscle weakness (generalized) (M62.81)   Activity Tolerance Patient limited by pain   Patient Left in bed;with call bell/phone within reach;with bed alarm set   Nurse Communication Mobility status        Time: 1450-1513 OT Time Calculation (min): 23 min  Charges: OT General Charges $OT Visit: 1 Visit OT Treatments $Therapeutic Activity: 23-37 mins  Jourdan Durbin, OTR/L Acute Care Rehab Services  Office [phone] Pager: [phone]   Kelli Churn 12/18/2020, 3:34 PM

## 2020-12-18 NOTE — Progress Notes (Signed)
[facility]                                                                              Patient Demographics  Carla Little, is a 30 y.o. female, DOB - 1990/12/13, WUJ:[id number]  Admit date - 12/09/2020   Admitting Physician No admitting provider for patient encounter.  Outpatient Primary MD for the patient is Patient, No Pcp Per (Inactive)  Outpatient specialists:   LOS - 8  days   Medical records reviewed and are as summarized below:    Chief Complaint  Patient presents with   Emesis   Nausea       Brief summary   Patient is a 30 year old female with morbid obesity, nonalcoholic fatty liver disease, GERD, depression, recently was hospitalized following a mechanical fall with a left knee dislocation, resultant MCL strain, ACL and PCL rupture then complicated by nausea vomiting, tachycardia, UTI and fecal impaction during the hospitalization. Patient had an endoscopy which noted small antral ulcer on 8/27, RUQ ultrasound was unremarkable.  Patient presented to ED with multiple episodes of nausea, vomiting, aches and pains everywhere. In ED, afebrile, mildly tachycardic, hypertensive.  Labs showed mildly abnormal AST, ALT, bilirubin of 2.  Assessment & Plan    Principal Problem:   Intractable vomiting with nausea -Possibly due to gastroparesis, esophagitis -Patient noted to have prolonged course of nausea and vomiting during recent hospitalization -EGD 11/19/2020 had shown small antral ulcer.  CT abdomen and pelvis with hepatomegaly fatty liver disease, no ductal dilatation or gallstones. -Abd [place] 9/20 showed gallbladder distended with sludge but no cholelithiasis, no wall thickening or pericholecystic fluid -CT angio abd-pelvis on 9/16 showed no vascular pathology, no acute localizing process in abdomen and pelvis.  Hepatomegaly with hepatic steatosis -HIDA scan negative -Stopped IV Ancef and Flagyl, reglan.  -Continue nystatin and lidocaine Magic  mouthwash, added 7 days of IV Diflucan  -States she was able to tolerate liquids but had difficulty swallowing pills  Paresthesias due to B12 deficiency, folate deficiency -Cortisol level normal, TSH 5.2, B12 very low of 153, folate 3.2, B1 pending -Started on B12 injections IM until patient able to tolerate p.o. pills -States having difficulty with tolerating oral folic acid tablet, placed on IV folic acid daily until able to tolerate p.o. consistently -Placed on Neurontin 100 mg 3 times daily for possible neuropathy -Increase mobility, patient very deconditioned, needs PT   E. coli UTI -Urine culture showed more than 100,000 colonies of E. coli and 20,000 colonies of Proteus mirabilis -IV Ancef and Flagyl discontinued  Recent left knee dislocation -Followed by orthopedics, appreciate recommendations by Dr. Magnus Ivan  -Recommended continue brace for another 2 to 4 weeks  -PT recommended SNF, TOC consulted  Sinus tachycardia -Continue beta-blocker  Hypothyroidism -TSH 5.2, free T4 1.3  GERD -Continue PPI  Iron deficiency anemia -History of heavy menorrhagia on her cycle, - H&H currently stable  Morbid obesity Estimated body mass index is 73.91 kg/m as calculated from the following:   Height as of this encounter:  (1.626 m).   Weight as of this encounter: 195.3 kg.  Code Status: Full DVT Prophylaxis:  SCDs Start:  12/09/20 1937   Level of Care: Level of care: Telemetry Family Communication: Discussed all imaging results, lab results, explained to the patient   Disposition Plan:     Status is: Inpatient  Remains inpatient appropriate because:Inpatient level of care appropriate due to severity of illness  Dispo: The patient is from: Home              Anticipated d/c is to: SNF              Patient currently is not medically stable to d/c.  States able to tolerate liquids but having difficulty with swallowing tablets.   Difficult to place patient No   Time Spent  in minutes: 35 minutes  Procedures:  None  Consultants:   Gastroenterology  Antimicrobials:   Anti-infectives (From admission, onward)    Start     Dose/Rate Route Frequency Ordered Stop   12/17/20 1100  fluconazole (DIFLUCAN) IVPB 100 mg        100 mg 50 mL/hr over 60 Minutes Intravenous Daily 12/17/20 1046 12/24/20 0959   12/13/20 2200  metroNIDAZOLE (FLAGYL) IVPB 500 mg  Status:  Discontinued        500 mg 100 mL/hr over 60 Minutes Intravenous Every 8 hours 12/13/20 2107 12/16/20 1620   12/13/20 1000  ceFAZolin (ANCEF) IVPB 2g/100 mL premix  Status:  Discontinued        2 g 200 mL/hr over 30 Minutes Intravenous Every 8 hours 12/13/20 0927 12/16/20 1721   12/12/20 1000  cefTRIAXone (ROCEPHIN) 2 g in sodium chloride 0.9 % 100 mL IVPB  Status:  Discontinued        2 g 200 mL/hr over 30 Minutes Intravenous Every 24 hours 12/12/20 0901 12/13/20 0928   12/11/20 0845  cefTRIAXone (ROCEPHIN) 1 g in sodium chloride 0.9 % 100 mL IVPB  Status:  Discontinued        1 g 200 mL/hr over 30 Minutes Intravenous Every 24 hours 12/11/20 0758 12/12/20 0901          Medications  Scheduled Meds:  bisacodyl  10 mg Rectal Once   ciprofloxacin  2 drop Right Eye Q4H while awake   cyanocobalamin  1,000 mcg Intramuscular Daily   folic acid  1 mg Intravenous Daily   gabapentin  100 mg Oral Q8H   magic mouthwash w/lidocaine  5 mL Oral TID AC   metoprolol tartrate  10 mg Intravenous Q6H   nystatin  5 mL Oral QID   pantoprazole (PROTONIX) IV  40 mg Intravenous Q12H   prochlorperazine  10 mg Intravenous Q8H   senna-docusate  1 tablet Oral BID   simethicone  160 mg Oral QID   sodium chloride flush  10-40 mL Intracatheter Q12H   Continuous Infusions:  fluconazole (DIFLUCAN) IV 100 mg (12/18/20 0858)   ondansetron (ZOFRAN) IV 8 mg (12/15/20 1750)   PRN Meds:.acetaminophen **OR** acetaminophen, diphenhydrAMINE, HYDROmorphone (DILAUDID) injection, menthol-cetylpyridinium, ondansetron **OR**  ondansetron (ZOFRAN) IV, oxyCODONE, phenol, sodium chloride flush      Subjective:   Carla Little was seen and examined today.  States able to tolerate the liquid diet but difficulty with pills.  Wants to try soft diet today.  Still has paresthesias but improving, very deconditioned.  Objective:   Vitals:   12/17/20 1649 12/17/20 1943 12/18/20 0422 12/18/20 0424  BP: 120/79 121/84 118/73   Pulse: (!) 103 (!) 102 (!) 105   Resp:  20 18   Temp:  97.9 F (36.6 C) 98.4 F (36.9  C)   TempSrc:      SpO2:  97% 90%   Weight:    (!) 195.3 kg  Height:        Intake/Output Summary (Last 24 hours) at 12/18/2020 1237 Last data filed at 12/18/2020 0830 Gross per 24 hour  Intake 180 ml  Output 525 ml  Net -345 ml      Wt Readings from Last 3 Encounters:  12/18/20 (!) 195.3 kg  11/01/20 (!) 181.4 kg  10/27/13 (!) 161.9 kg   Physical Exam General: Alert and oriented x 3, NAD Cardiovascular: S1 S2 clear, RRR. No pedal edema b/l Respiratory: CTAB, Gastrointestinal: Soft, NT, ND, NBS, obese Ext: no pedal edema bilaterally Neuro: no focal neurological deficits,  Psych: Normal affect and demeanor, alert and oriented x3    Data Reviewed:  I have personally reviewed following labs and imaging studies  Micro Results Recent Results (from the past 240 hour(s))  Resp Panel by RT-PCR (Flu A&B, Covid) Nasopharyngeal Swab     Status: None   Collection Time: 12/09/20  8:10 PM   Specimen: Nasopharyngeal Swab; Nasopharyngeal(NP) swabs in vial transport medium  Result Value Ref Range Status   SARS Coronavirus 2 by RT PCR NEGATIVE NEGATIVE Final    Comment: (NOTE) SARS-CoV-2 target nucleic acids are NOT DETECTED.  The SARS-CoV-2 RNA is generally detectable in upper respiratory specimens during the acute phase of infection. The lowest concentration of SARS-CoV-2 viral copies this assay can detect is 138 copies/mL. A negative result does not preclude SARS-Cov-2 infection and should not  be used as the sole basis for treatment or other patient management decisions. A negative result may occur with  improper specimen collection/handling, submission of specimen other than nasopharyngeal swab, presence of viral mutation(s) within the areas targeted by this assay, and inadequate number of viral copies(<138 copies/mL). A negative result must be combined with clinical observations, patient history, and epidemiological information. The expected result is Negative.  Fact Sheet for Patients:  BloggerCourse.com  Fact Sheet for Healthcare Providers:  SeriousBroker.it  This test is no t yet approved or cleared by the [place] FDA and  has been authorized for detection and/or diagnosis of SARS-CoV-2 by FDA under an Emergency Use Authorization (EUA). This EUA will remain  in effect (meaning this test can be used) for the duration of the COVID-19 declaration under Section 564(b)(1) of the Act, 21 U.S.C.section 360bbb-3(b)(1), unless the authorization is terminated  or revoked sooner.       Influenza A by PCR NEGATIVE NEGATIVE Final   Influenza B by PCR NEGATIVE NEGATIVE Final    Comment: (NOTE) The Xpert Xpress SARS-CoV-2/FLU/RSV plus assay is intended as an aid in the diagnosis of influenza from Nasopharyngeal swab specimens and should not be used as a sole basis for treatment. Nasal washings and aspirates are unacceptable for Xpert Xpress SARS-CoV-2/FLU/RSV testing.  Fact Sheet for Patients: BloggerCourse.com  Fact Sheet for Healthcare Providers: SeriousBroker.it  This test is not yet approved or cleared by the [place] FDA and has been authorized for detection and/or diagnosis of SARS-CoV-2 by FDA under an Emergency Use Authorization (EUA). This EUA will remain in effect (meaning this test can be used) for the duration of the COVID-19 declaration under Section  564(b)(1) of the Act, 21 U.S.C. section 360bbb-3(b)(1), unless the authorization is terminated or revoked.  Performed at [facility], [address] [place], [place] [address]   Urine Culture     Status: Abnormal   Collection Time:  12/11/20 12:37 AM   Specimen: Urine, Random  Result Value Ref Range Status   Specimen Description   Final    URINE, RANDOM Performed at [facility], [address], [place], [place] [address]    Special Requests   Final    NONE Performed at [facility], [address], [place], [place] [address]    Culture (A)  Final    >=100,000 COLONIES/mL ESCHERICHIA COLI 20,000 COLONIES/mL PROTEUS MIRABILIS    Report Status 12/13/2020 FINAL  Final   Organism ID, Bacteria ESCHERICHIA COLI (A)  Final   Organism ID, Bacteria PROTEUS MIRABILIS (A)  Final      Susceptibility   Escherichia coli - MIC*    AMPICILLIN >=32 RESISTANT Resistant     CEFAZOLIN <=4 SENSITIVE Sensitive     CEFEPIME <=0.12 SENSITIVE Sensitive     CEFTRIAXONE <=0.25 SENSITIVE Sensitive     CIPROFLOXACIN <=0.25 SENSITIVE Sensitive     GENTAMICIN <=1 SENSITIVE Sensitive     IMIPENEM <=0.25 SENSITIVE Sensitive     NITROFURANTOIN 32 SENSITIVE Sensitive     TRIMETH/SULFA <=20 SENSITIVE Sensitive     AMPICILLIN/SULBACTAM 16 INTERMEDIATE Intermediate     PIP/TAZO <=4 SENSITIVE Sensitive     * >=100,000 COLONIES/mL ESCHERICHIA COLI   Proteus mirabilis - MIC*    AMPICILLIN <=2 SENSITIVE Sensitive     CEFAZOLIN <=4 SENSITIVE Sensitive     CEFEPIME <=0.12 SENSITIVE Sensitive     CEFTRIAXONE <=0.25 SENSITIVE Sensitive     CIPROFLOXACIN <=0.25 SENSITIVE Sensitive     GENTAMICIN <=1 SENSITIVE Sensitive     IMIPENEM 2 SENSITIVE Sensitive     NITROFURANTOIN RESISTANT Resistant     TRIMETH/SULFA <=20 SENSITIVE Sensitive     AMPICILLIN/SULBACTAM <=2 SENSITIVE Sensitive     PIP/TAZO <=4 SENSITIVE Sensitive     * 20,000 COLONIES/mL PROTEUS  MIRABILIS    Radiology Reports DG Chest 1 View  Result Date: 12/11/2020 CLINICAL DATA:  Shortness of breath EXAM: CHEST  1 VIEW COMPARISON:  05/16/2018. FINDINGS: Evaluation is limited by body habitus and positioning. Low lung volumes. The heart is likely normal in size, given projection. No focal pulmonary opacity. No definite pleural effusion or pneumothorax. No acute osseous abnormality. IMPRESSION: Evaluation is limited by body habitus, positioning, and low lung volumes. Within this limitation, no acute cardiopulmonary process. Electronically Signed   By: Wiliam Ke M.D.   On: 12/11/2020 21:48   US Abdomen Complete  Result Date: 12/13/2020 CLINICAL DATA:  Elevated LFTs and nausea and vomiting EXAM: ABDOMEN ULTRASOUND COMPLETE COMPARISON:  Ultrasound from 11/20/2020 and CT from 12/09/2020 FINDINGS: Gallbladder: Gallbladder is well distended with evidence of sludge. No cholelithiasis is noted. No wall thickening or pericholecystic fluid is noted. Common bile duct: Diameter: 3 mm Liver: Diffuse increased echogenicity is noted without focal mass consistent with fatty infiltration. This is similar to that seen on prior CT examination. Portal vein is patent on color Doppler imaging with normal direction of blood flow towards the liver. IVC: No abnormality visualized. Pancreas: Visualized portion unremarkable. Spleen: Size and appearance within normal limits. Right Kidney: Length: 13.9 cm. Echogenicity within normal limits. No mass or hydronephrosis visualized. Left Kidney: Length: 12.4 cm. Echogenicity within normal limits. No mass or hydronephrosis visualized. Abdominal aorta: No aneurysm visualized. Other findings: None. IMPRESSION: Gallbladder sludge. Fatty infiltration of the liver. No other focal abnormality is noted. Electronically Signed   By: Alcide Clever M.D.   On: 12/13/2020 20:30   NM Hepato W/EF  Result Date: 12/16/2020 CLINICAL DATA:  Nausea and vomiting. Gallbladder sludge. Elevated  bilirubin EXAM: NUCLEAR MEDICINE HEPATOBILIARY IMAGING WITH GALLBLADDER EF TECHNIQUE: Sequential images of the abdomen were obtained out to 60 minutes following intravenous administration of radiopharmaceutical. After oral ingestion of Ensure, gallbladder ejection fraction was determined. At 60 min, normal ejection fraction is greater than 33%. RADIOPHARMACEUTICALS:  7.2 mCi Tc-51m  Choletec IV COMPARISON:  Ultrasound 12/13/2020, CT 12/09/2020 FINDINGS: There is prompt clearance radiotracer from the blood pool but some delay in clearance into the biliary tree presumably related to elevated bilirubin. The gallbladder fills by 60 minutes. Small amount of counts are present in the small bowel. Fatty meal was administered and the gallbladder contracted near completely prior to re-imaging. Calculated gallbladder ejection fraction is 99%. (Normal gallbladder ejection fraction with Ensure is greater than 33%.) IMPRESSION: 1. Patent cystic duct and common bile duct. Filling of the gallbladder. 2. Rapid emptying of the gallbladder.  99% ejection fraction. 3. Slow transient radiotracer into the biliary tree and small bowel related to elevated bilirubin. Electronically Signed   By: Genevive Bi M.D.   On: 12/16/2020 12:27   DG Knee Left Port  Result Date: 12/13/2020 CLINICAL DATA:  History of dislocation of knee. EXAM: PORTABLE LEFT KNEE - 1-2 VIEW COMPARISON:  Most recent radiograph 11/21/2020 FINDINGS: Proximal fibular fracture is less well-defined on the current exam, unclear if this is related to differences in technique or interval healing. There is a small fracture fragment projects lateral to the lateral tibial plateau. Fracture fragment arising from the medial femoral condyle is faintly visualized. Narrowing of the medial and widening of the lateral tibiofemoral compartments. Patellofemoral relationship is not well-defined on the current exam due to habitus and positioning. IMPRESSION: 1. Proximal fibular  fracture is less well-defined on the current exam, unclear if this is related to differences in technique or interval healing. Small fracture fragment projects lateral to the lateral tibial plateau. 2. Medial femoral condyle fracture fragment is faintly visualized. 3. Narrowing of the medial and widening of the lateral tibiofemoral compartments, possibly due to ligamentous injury. Electronically Signed   By: Narda Rutherford M.D.   On: 12/13/2020 10:36   DG Knee Left Port  Result Date: 11/21/2020 CLINICAL DATA:  Knee injury, fall a couple weeks ago EXAM: PORTABLE LEFT KNEE - 1-2 VIEW COMPARISON:  None. FINDINGS: No evidence of fracture, dislocation, or joint effusion. No evidence of arthropathy or other focal bone abnormality. Soft tissues are unremarkable. IMPRESSION: No fracture or dislocation of the left knee. Joint spaces are preserved. Electronically Signed   By: Lauralyn Primes M.D.   On: 11/21/2020 19:15   DG ABD ACUTE 2+V W 1V CHEST  Result Date: 12/13/2020 CLINICAL DATA:  Nausea and vomiting. EXAM: DG ABDOMEN ACUTE WITH 1 VIEW CHEST COMPARISON:  CT 12/09/2020 FINDINGS: Evaluation is limited due to body habitus. Shallow inspiration with elevation of the right hemidiaphragm. Linear areas of infiltration or atelectasis in the right mid and lower lungs. Left lung is clear. Heart size and pulmonary vascularity are normal. No free air under the hemidiaphragms. Gas and stool throughout the colon. No small or large bowel distention is appreciated. No radiopaque stones are identified. IMPRESSION: 1. Shallow inspiration with elevation of the right hemidiaphragm. Infiltration or atelectasis in the right lung. Possibly pneumonia. 2. Nonobstructive bowel gas pattern with gas-filled colon. Electronically Signed   By: Burman Nieves M.D.   On: 12/13/2020 20:11   CT Angio Abd/Pel W and/or Wo Contrast  Result Date:  12/09/2020 CLINICAL DATA:  Concern for chronic mesenteric ischemia. EXAM: CTA ABDOMEN AND PELVIS  WITHOUT AND WITH CONTRAST TECHNIQUE: Multidetector CT imaging of the abdomen and pelvis was performed using the standard protocol during bolus administration of intravenous contrast. Multiplanar reconstructed images and MIPs were obtained and reviewed to evaluate the vascular anatomy. CONTRAST:  OMNIPAQUE IOHEXOL 350 MG/ML SOLN COMPARISON:  CT abdomen and pelvis 06/08/2016. FINDINGS: VASCULAR Aorta: Normal caliber aorta without aneurysm, dissection, vasculitis or significant stenosis. Celiac: Patent without evidence of aneurysm, dissection, vasculitis or significant stenosis. SMA: Patent without evidence of aneurysm, dissection, vasculitis or significant stenosis. Renals: Both renal arteries are patent without evidence of aneurysm, dissection, vasculitis, fibromuscular dysplasia or significant stenosis. IMA: Patent without evidence of aneurysm, dissection, vasculitis or significant stenosis. Inflow: Patent without evidence of aneurysm, dissection, vasculitis or significant stenosis. Proximal Outflow: Bilateral common femoral and visualized portions of the superficial and profunda femoral arteries are patent without evidence of aneurysm, dissection, vasculitis or significant stenosis. Veins: No obvious venous abnormality within the limitations of this arterial phase study. Review of the MIP images confirms the above findings. NON-VASCULAR Lower chest: No acute abnormality. Hepatobiliary: Liver is enlarged. There is diffuse fatty infiltration of the liver. No calcified gallstones are seen. There is no biliary ductal dilatation identified. Pancreas: Unremarkable. No pancreatic ductal dilatation or surrounding inflammatory changes. Spleen: Normal in size without focal abnormality. Adrenals/Urinary Tract: Adrenal glands are unremarkable. Kidneys are normal, without renal calculi, focal lesion, or hydronephrosis. Bladder is unremarkable. Stomach/Bowel: Stomach is within normal limits. Appendix appears normal. No  evidence of bowel wall thickening, distention, or inflammatory changes. Lymphatic: No significant vascular findings are present. No enlarged abdominal or pelvic lymph nodes. Reproductive: Uterus and bilateral adnexa are unremarkable. Other: No abdominal wall hernia or abnormality. No abdominopelvic ascites. Musculoskeletal: No acute or significant osseous findings. IMPRESSION: VASCULAR 1. No acute vascular pathology identified. NON-VASCULAR 1. No acute localizing process in the abdomen or pelvis. 2. Hepatomegaly and hepatic steatosis. Electronically Signed   By: Darliss Cheney M.D.   On: 12/09/2020 19:22   US Abdomen Limited RUQ (LIVER/GB)  Result Date: 11/20/2020 CLINICAL DATA:  Cholelithiasis. EXAM: ULTRASOUND ABDOMEN LIMITED RIGHT UPPER QUADRANT COMPARISON:  None. FINDINGS: Gallbladder: No gallstones or wall thickening visualized (2.5 mm). No sonographic Murphy sign noted by sonographer. Common bile duct: Diameter: 5.1 mm Liver: No focal lesion identified. Within normal limits in parenchymal echogenicity. Portal vein is patent on color Doppler imaging with normal direction of blood flow towards the liver. Other: None. IMPRESSION: Normal right upper quadrant ultrasound. Electronically Signed   By: Aram Candela M.D.   On: 11/20/2020 00:54    Lab Data:  CBC: Recent Labs  Lab 12/12/20 0623 12/13/20 0523 12/14/20 0549 12/15/20 0520 12/17/20 0528  WBC 6.2 4.8 4.9 6.7 6.5  NEUTROABS  --  2.2 2.6  --   --   HGB 8.9* 8.8* 9.6* 9.1* 9.0*  HCT 29.3* 28.6* 30.9* 29.6* 29.5*  MCV 98.7 99.3 100.3* 98.7 100.3*  PLT 284 269 254 300 290   Basic Metabolic Panel: Recent Labs  Lab 12/12/20 0623 12/13/20 0523 12/14/20 0703 12/15/20 0520 12/16/20 0446 12/17/20 0528  NA 144 140 142 141 138 143  K 4.1 3.8 3.6 3.6 4.2 4.1  CL 110 105 105 108 104 108  CO2 25 28 26 26 22 24   GLUCOSE 95 92 131* 101* 96 114*  BUN 8 6 <5* <5* 5* 5*  CREATININE 0.53 0.49 0.45 0.68 0.55 0.52  CALCIUM 8.8*  8.5* 8.7* 9.1  8.6* 9.2  MG 1.9 1.9 2.0  --   --   --   PHOS  --   --  2.9  --   --   --    GFR: Estimated Creatinine Clearance: 178.4 mL/min (by C-G formula based on SCr of 0.52 mg/dL). Liver Function Tests: Recent Labs  Lab 12/13/20 0523 12/14/20 0703 12/15/20 0520 12/16/20 0446 12/17/20 0528  AST 130* 135* 119* 138* 143*  ALT 51* 53* 45* 33 40  ALKPHOS 50 58 57 51 59  BILITOT 1.6* 1.7* 2.1* 2.4* 2.5*  PROT 7.1 7.4 7.5 7.1 7.5  ALBUMIN 3.0* 3.1* 3.2* 3.1* 3.1*   Recent Labs  Lab 12/15/20 0520  LIPASE 25   No results for input(s): AMMONIA in the last 168 hours. Coagulation Profile: No results for input(s): INR, PROTIME in the last 168 hours. Cardiac Enzymes: No results for input(s): CKTOTAL, CKMB, CKMBINDEX, TROPONINI in the last 168 hours. BNP (last 3 results) No results for input(s): PROBNP in the last 8760 hours. HbA1C: No results for input(s): HGBA1C in the last 72 hours. CBG: Recent Labs  Lab 12/13/20 1104  GLUCAP 96   Lipid Profile: No results for input(s): CHOL, HDL, LDLCALC, TRIG, CHOLHDL, LDLDIRECT in the last 72 hours. Thyroid Function Tests: Recent Labs    12/16/20 1900  TSH 5.227*  FREET4 1.33*   Anemia Panel: Recent Labs    12/16/20 1900  VITAMINB12 153*  FOLATE 3.2*   Urine analysis:    Component Value Date/Time   COLORURINE AMBER (A) 12/11/2020 0037   APPEARANCEUR CLOUDY (A) 12/11/2020 0037   LABSPEC 1.020 12/11/2020 0037   PHURINE 5.0 12/11/2020 0037   GLUCOSEU NEGATIVE 12/11/2020 0037   HGBUR LARGE (A) 12/11/2020 0037   BILIRUBINUR NEGATIVE 12/11/2020 0037   KETONESUR 5 (A) 12/11/2020 0037   PROTEINUR 100 (A) 12/11/2020 0037   UROBILINOGEN 1.0 10/27/2013 2116   NITRITE NEGATIVE 12/11/2020 0037   LEUKOCYTESUR LARGE (A) 12/11/2020 0037     Aberdeen Hafen M.D. [facility] 12/18/2020, 12:37 PM  Available via Epic secure chat 7am-7pm After 7 pm, please refer to night coverage provider listed on amion.

## 2020-12-19 DIAGNOSIS — F411 Generalized anxiety disorder: Secondary | ICD-10-CM

## 2020-12-19 LAB — VITAMIN B1: Vitamin B1 (Thiamine): 80.6 nmol/L (ref 66.5–200.0)

## 2020-12-19 MED ORDER — BUSPIRONE HCL 5 MG PO TABS
5.0000 mg | ORAL_TABLET | Freq: Every day | ORAL | Status: DC
  Administered 2020-12-19 – 2020-12-25 (×7): 5 mg via ORAL
  Filled 2020-12-19 (×7): qty 1

## 2020-12-19 NOTE — Progress Notes (Signed)
Physical Therapy Treatment Patient Details Name: Carla Little MRN: [id number] DOB: 1991-03-19 Today's Date: 12/19/2020   History of Present Illness Patient is a 30 y.o. female who presented to [facility] for intractable N/V on 9/17. ED labwork reveals e. coli UTI. Pt had recent hospital admission from 8/8-9/5 due to Lt knee dislocation which was reduced with fractures of the proximal fibula ligamentous avulsion laterally as well as medially off the medial femoral condyle and MRI showed complete ACL & PCL tears and MCL strain. Patient was placed in knee immobilizer throughout duration of admission with no knee flexion permitted. That hospital admission was complicated by nausea, vomiting, tachycardia, UTI, and fecal impaction; she was treated for nausea and vomiting with supportive care, had an endoscopy which noted a small antral ulcer on 8/27. As of 8/13 she was placed in a Lt Bledsoe brace with WBAT orders and no flexion of knee. PMH significant for morbid obesity, GERD, fatty liver disease, depression.    PT Comments    Pt continues to have profound weakness and poor coordination in BUEs which was not present last week. Pt had food and drink spilled on herself and in the bed from attempting to feed herself. She also reported difficulty swallowing due to reported tingling/numbness in her mouth, it was noted her mouth was very full of unswallowed food. Pt had not had pain medication prior to this morning's PT session, so this was a second attempt at mobility after having pain medication. +2 total assist for supine to sit. She sat at edge of bed x 2 minutes, tolerance limited by 8/10 back pain and dizziness. She is declining significantly with mobility. She does put forth good effort, but fatigues quickly.    Recommendations for follow up therapy are one component of a multi-disciplinary discharge planning process, led by the attending physician.  Recommendations may be updated based on patient status,  additional functional criteria and insurance authorization.  Follow Up Recommendations  SNF;Supervision for mobility/OOB     Equipment Recommendations  Rolling walker with 5" wheels;3in1 (PT);Wheelchair (measurements PT);Wheelchair cushion (measurements PT)    Recommendations for Other Services Rehab consult     Precautions / Restrictions Precautions Precautions: Fall Precaution Comments: PT Orders from Maureen Ralphs, MD; "WBAT Left LE, only up with a walker, does not need brace at this standpoint." Also in his note says if probalems with mobility - use brace again. Required Braces or Orthoses: Knee Immobilizer - Left Knee Immobilizer - Left: On at all times Restrictions Other Position/Activity Restrictions: PT Orders from Maureen Ralphs, MD; "WBAT Left LE, only up with a walker, does not need brace at this standpoint"     Mobility  Bed Mobility Overal bed mobility: Needs Assistance Bed Mobility: Supine to Sit;Sit to Supine     Supine to sit: HOB elevated;+2 for physical assistance;Total assist Sit to supine: +2 for physical assistance;Total assist   General bed mobility comments: more assist needed today compared to my session with her last week, only able to tolerate sitting ~2 minutes seconds 2* back pain. Pt required UE support in sitting due to feeling dizzy.    Transfers                    Ambulation/Gait                 Stairs             Wheelchair Mobility    Modified Rankin (Stroke Patients Only)  Balance Overall balance assessment: Needs assistance Sitting-balance support: Single extremity supported;Feet unsupported Sitting balance-Leahy Scale: Poor Sitting balance - Comments: reliant on UE support       Standing balance comment: heavy reliance upon BUE support                            Cognition Arousal/Alertness: Awake/alert Behavior During Therapy: Flat affect Overall Cognitive Status: Within Functional  Limits for tasks assessed                                 General Comments: pt seems depressed, flat affect, now having trouble with BUE coordination and tingling in BUE/LEs, observed pt having difficulty feeding herself due to BUE weakness and lack of coordination (food and drink had spilled onto pt)      Exercises General Exercises - Lower Extremity Quad Sets: AROM;Left;5 reps;Supine Other Exercises Other Exercises: gripping towel roll B hands x 5    General Comments General comments (skin integrity, edema, etc.): pt reporting tingling BUE/LEs, she reports sensation to light touch is intact, also noted significant coordination difficulties in BUEs, she needed help to answer her cell phone, poor control with fingertip to nose with both UEs      Pertinent Vitals/Pain Pain Score: 8  Pain Location: back in sitting Pain Descriptors / Indicators: Discomfort;Grimacing;Guarding;Pins and needles;Sore;Spasm Pain Intervention(s): Limited activity within patient's tolerance;Monitored during session;Premedicated before session;Repositioned    Home Living                      Prior Function            PT Goals (current goals can now be found in the care plan section) Acute Rehab PT Goals Patient Stated Goal: to get better before going home PT Goal Formulation: With patient Time For Goal Achievement: 12/27/20 Potential to Achieve Goals: Good Progress towards PT goals: Not progressing toward goals - comment (decline functionally compared to last week.)    Frequency    Min 5X/week      PT Plan Current plan remains appropriate    Co-evaluation              AM-PAC PT "6 Clicks" Mobility   Outcome Measure  Help needed turning from your back to your side while in a flat bed without using bedrails?: Total Help needed moving from lying on your back to sitting on the side of a flat bed without using bedrails?: Total Help needed moving to and from a bed to a  chair (including a wheelchair)?: Total Help needed standing up from a chair using your arms (e.g., wheelchair or bedside chair)?: Total Help needed to walk in hospital room?: Total Help needed climbing 3-5 steps with a railing? : Total 6 Click Score: 6    End of Session   Activity Tolerance: Patient limited by fatigue;Patient limited by pain Patient left: in bed;with call bell/phone within reach;with bed alarm set Nurse Communication: Mobility status PT Visit Diagnosis: Muscle weakness (generalized) (M62.81) Pain - Right/Left: Left Pain - part of body: Knee     Time: 1829-9371 PT Time Calculation (min) (ACUTE ONLY): 19 min  Charges:  $Therapeutic Activity: 8-22 mins                    Ralene Bathe Kistler PT 12/19/2020  Acute Rehabilitation Services Pager [phone] Office [phone]

## 2020-12-19 NOTE — Progress Notes (Signed)
Physical Therapy Treatment Patient Details Name: Carla Little MRN: [id number] DOB: [birth year] Today's Date: 12/19/2020   History of Present Illness Patient is a 30 y.o. female who presented to [facility] for intractable N/V on 9/17. ED labwork reveals e. coli UTI. Pt had recent hospital admission from 8/8-9/5 due to Lt knee dislocation which was reduced with fractures of the proximal fibula ligamentous avulsion laterally as well as medially off the medial femoral condyle and MRI showed complete ACL & PCL tears and MCL strain. Patient was placed in knee immobilizer throughout duration of admission with no knee flexion permitted. That hospital admission was complicated by nausea, vomiting, tachycardia, UTI, and fecal impaction; she was treated for nausea and vomiting with supportive care, had an endoscopy which noted a small antral ulcer on 8/27. As of 8/13 she was placed in a Lt Bledsoe brace with WBAT orders and no flexion of knee. PMH significant for morbid obesity, GERD, fatty liver disease, depression.    PT Comments    Pt reports tingling BUE/LEs and significant difficulty with BUE coordination, she was observed having difficulty answering her cell phone. She was unable to perform fingertip to nose with both hands, poor control of movement. This was not present during last admission in August. +2 max assist for supine to sit. She tolerated ~90 seconds of sitting, tolerance limited by back pain. Pain meds requested. Reviewed isometric quadriceps exercises for pt to do independently and instructed her in towel gripping exercise to address her weak grip strength.  She is putting forth good effort. She is exhibiting a significant decline since last week, and even more so than her previous admission 8/8-11/28/20 when she was able to ambulate 50'. Her face and BUEs appear swollen.   Recommendations for follow up therapy are one component of a multi-disciplinary discharge planning process, led by the attending  physician.  Recommendations may be updated based on patient status, additional functional criteria and insurance authorization.  Follow Up Recommendations  CIR;Supervision for mobility/OOB     Equipment Recommendations  Rolling walker with 5" wheels;3in1 (PT);Wheelchair (measurements PT);Wheelchair cushion (measurements PT)    Recommendations for Other Services Rehab consult     Precautions / Restrictions Precautions Precautions: Fall Precaution Comments: PT Orders from Maureen Ralphs, MD; "WBAT Left LE, only up with a walker, does not need brace at this standpoint." Also in his note says if probalems with mobility - use brace again. Required Braces or Orthoses: Knee Immobilizer - Left Knee Immobilizer - Left: On at all times Restrictions Other Position/Activity Restrictions: PT Orders from Maureen Ralphs, MD; "WBAT Left LE, only up with a walker, does not need brace at this standpoint"     Mobility  Bed Mobility Overal bed mobility: Needs Assistance Bed Mobility: Supine to Sit;Sit to Supine     Supine to sit: HOB elevated;Max assist;+2 for physical assistance Sit to supine: +2 for physical assistance;Max assist   General bed mobility comments: more assist needed today compared to my session with her last week, only able to tolerate sitting ~90 seconds 2* back pain, pain meds requested. Pt does put forth good effort. She did not feel she could tolerate sit to stand 2* back pain.    Transfers                    Ambulation/Gait                 Stairs  Wheelchair Mobility    Modified Rankin (Stroke Patients Only)       Balance Overall balance assessment: Needs assistance Sitting-balance support: No upper extremity supported Sitting balance-Leahy Scale: Fair Sitting balance - Comments: reliant on UE support       Standing balance comment: heavy reliance upon BUE support                            Cognition  Arousal/Alertness: Awake/alert Behavior During Therapy: Flat affect Overall Cognitive Status: Within Functional Limits for tasks assessed                                 General Comments: pt seems depressed, flat affect, now having trouble with BUE coordination and tingling in BUE/LEs, observed pt having difficulty answering her cell phone due to poor coordination      Exercises General Exercises - Lower Extremity Quad Sets: AROM;Left;5 reps;Supine Other Exercises Other Exercises: gripping towel roll B hands x 5    General Comments General comments (skin integrity, edema, etc.): pt reporting tingling BUE/LEs, she reports sensation to light touch is intact, also noted significant coordination difficulties in BUEs, she needed help to answer her cell phone, poor control with fingertip to nose with both UEs      Pertinent Vitals/Pain Pain Score: 8  Pain Location: back, BLEs Pain Descriptors / Indicators: Discomfort;Grimacing;Guarding;Pins and needles;Sore;Spasm Pain Intervention(s): Limited activity within patient's tolerance;Monitored during session;Patient requesting pain meds-RN notified;Repositioned    Home Living                      Prior Function            PT Goals (current goals can now be found in the care plan section) Acute Rehab PT Goals Patient Stated Goal: to get better before going home PT Goal Formulation: With patient Time For Goal Achievement: 12/27/20 Potential to Achieve Goals: Good Progress towards PT goals: Not progressing toward goals - comment (decline since last week)    Frequency    Min 5X/week      PT Plan Current plan remains appropriate    Co-evaluation              AM-PAC PT "6 Clicks" Mobility   Outcome Measure  Help needed turning from your back to your side while in a flat bed without using bedrails?: Total Help needed moving from lying on your back to sitting on the side of a flat bed without using  bedrails?: Total Help needed moving to and from a bed to a chair (including a wheelchair)?: Total Help needed standing up from a chair using your arms (e.g., wheelchair or bedside chair)?: Total Help needed to walk in hospital room?: Total Help needed climbing 3-5 steps with a railing? : Total 6 Click Score: 6    End of Session   Activity Tolerance: Patient limited by fatigue;Patient limited by pain Patient left: in bed;with call bell/phone within reach;with bed alarm set Nurse Communication: Mobility status;Patient requests pain meds PT Visit Diagnosis: Muscle weakness (generalized) (M62.81) Pain - Right/Left: Left Pain - part of body: Knee     Time: 6767-2094 PT Time Calculation (min) (ACUTE ONLY): 37 min  Charges:  $Therapeutic Activity: 23-37 mins                     Ralene Bathe Kistler PT 12/19/2020  Acute Rehabilitation Services Pager [phone] Office [phone]

## 2020-12-19 NOTE — Consult Note (Signed)
[facility] Psychiatry New Psychiatric Evaluation   Service Date: December 19, 2020 LOS:  LOS: 9 days    Assessment  Carla Little is a 30 y.o. female admitted medically for 12/09/2020  3:04 PM for nausea, vomiting and a recent hospitalization for mechanical fall. She carries the psychiatric diagnoses of depression and anxiety and has a past medical history of  NAFLD, GERD, depression.Psychiatry was consulted for psychiatric contribution to presenting symptoms (parasthesias, weakness) by Dr. Isidoro Donning.    Her current presentation of several years of low mood, anhedonia, difficulty with self care is most consistent with social isolation and depression. She is at high risk for psychosomatic illness (symptoms out of proportion to objective findings, childlike appearance, socially isolated, bulk of human connection with medical system). However this is a diagnosis of exclusion and patient deserves full workup and neurology consultation. Whether or not patient is ultimately diagnosed with conversion disorder, ongoing physical therapy involvement will be essential to her care. She would particularly benefit from outpatient psychiatric followup including therapy; have recommended IOP after discharge from hospital Current outpatient psychotropic medications include nothing.  On initial examination, patient endorsed feelings of anxiety, depression. She is upset that the hospital team feels she isn't trying hard to get better and feels she has only been in the hospital for a few days; validated patient's feelings and emphasized all of the improvements she has made in her life over the last 2 years. She was not interested in starting an antidepressant but did consent to off-label use of buspirone as a promotility agent; will start at very low dose. Please see plan below for detailed recommendations.   Diagnoses:  Active Hospital problems: Principal Problem:   Intractable vomiting with nausea Active  Problems:   Left knee dislocation   PUD (peptic ulcer disease)   Nonalcoholic steatohepatitis (NASH)   Class 3 obesity   Sinus tachycardia   Hypokalemia   GERD (gastroesophageal reflux disease)   Iron deficiency anemia due to chronic blood loss   E. coli UTI    Problems edited/added by me: No problems updated.  Plan  ## Safety and Observation Level:  - Based on my clinical evaluation, I estimate the patient to be at low risk of self harm in the current setting - At this time, we recommend a standard level of observation. This decision is based on my review of the chart including patient's history and current presentation, interview of the patient, mental status examination, and consideration of suicide risk including evaluating suicidal ideation, plan, intent, suicidal or self-harm behaviors, risk factors, and protective factors. This judgment is based on our ability to directly address suicide risk, implement suicide prevention strategies and develop a safety plan while the patient is in the clinical setting. Please contact our team if there is a concern that risk level has changed.   ## Medications:  -- s buspirone 5 mg with dinner (ordered for 1800)   ## Medical Decision Making Capacity:  Not formally assessed at this time  ## Further Work-up:  -- TSH mildly elevated, free T4 elevated (?illness?) -- folate low -- B1 wnl -- B12 low -- hepatitis panel wnl -- CMP generally with reduced albumin, AST>ALT, increased bilirubin  ## Disposition:  -- no psychiatric contraindication to dc   ##Legal Status -- voluntary   Thank you for this consult request. Recommendations have been communicated to the primary team.  We will continue to follow at this time.   Eriona Kinchen A Judah Chevere  NEW followup history  Relevant Aspects of Hospital Course:  Admitted on 12/09/2020 for nausea and vomiting. Has developed weakness and parasthesias over the past few days which are out of  proportion to medical findings.   Patient Report:  Patient seen lying in bed. Her understanding is that psychiatry was consulted because "she (Dr. Isidoro Donning) doesn't think I'm doing as good as I could and I don't need to be in the hospital". Patient states that even though she is overweight she was "fine" and able to take care of herself before her injuries - she had a fall on her first day of work from home. She comments on her high pitched voice, stating that she doesn't normally talk like this and is embarrassed about it.   Bulk of interview spent focusing on patient's strengths - since a "nervous breakdown" 2 years ago (see notes from feb 2020; in brief hospitalized during a period of homelessness shortly after the loss of her family for suicidality, depression and anxiety) she has gotten her own apartment, her rent is paid through November, she has a small social circle, she has been employed, and she has a female friend she is talking to who lives in [place]. Her panic attacks have largely self resolved. She has had a visitor this hospitalization. 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   She is suffering from not being able to handle her current health issues and currently feels depressed. She is unwilling to discuss antidepressants at this time (felt like lexapro made her "like a zombie") but is willing to retrial buspirone as a pro-motility agent.     ROS:  (+) for numbness, weakness, nausea  Collateral information:  None obtained this visit  Psychiatric History:  Information collected from patient  Dx with depression, anxiety in 2020; one lifetime hospitalization. Discharged on lexapro, buspar, trazodone, and hydroxyzine - felt "like a zombie" and gained a lot of weight. Self discontinued these medications.   Generally had good childhood, denies physical or sexual trauma. Was  bullied as a child.   Medical History: Past Medical History:  Diagnosis Date  . Class 3 obesity 12/09/2020  . Depression   . GERD (gastroesophageal reflux disease)   . Nonalcoholic steatohepatitis (NASH) 12/09/2020  . Obesity   . PUD (peptic ulcer disease) 12/09/2020    Surgical History: Past Surgical History:  Procedure Laterality Date  . BIOPSY  11/19/2020   Procedure: BIOPSY;  Surgeon: Charna Elizabeth, MD;  Location: [facility];  Service: Endoscopy;;  . ESOPHAGOGASTRODUODENOSCOPY (EGD) WITH PROPOFOL N/A 11/19/2020   Procedure: ESOPHAGOGASTRODUODENOSCOPY (EGD) WITH PROPOFOL;  Surgeon: Charna Elizabeth, MD;  Location: [facility];  Service: Endoscopy;  Laterality: N/A;    Medications:   Current Facility-Administered Medications:  .  acetaminophen (TYLENOL) tablet 650 mg, 650 mg, Oral, Q6H PRN **OR** acetaminophen (TYLENOL) suppository 650 mg, 650 mg, Rectal, Q6H PRN, Bobette Mo, MD .  bisacodyl (DULCOLAX) suppository 10 mg, 10 mg, Rectal, Once, Rodolph Bong, MD .  ciprofloxacin (CILOXAN) 0.3 % ophthalmic solution 2 drop, 2 drop, Right Eye, Q4H while awake, Bobette Mo, MD, 2 drop at 12/19/20 1520 .  cyanocobalamin ((VITAMIN B-12)) injection 1,000 mcg, 1,000 mcg, Intramuscular, Daily, Rai, Ripudeep K, MD, 1,000 mcg at 12/19/20 1122 .  diphenhydrAMINE (BENADRYL) 12.5 MG/5ML elixir 25 mg, 25 mg, Oral, Q6H PRN, Rai, Ripudeep K, MD .  fluconazole (DIFLUCAN) IVPB  100 mg, 100 mg, Intravenous, Daily, Rai, Ripudeep K, MD, Last Rate: 50 mL/hr at 12/19/20 1157, 100 mg at 12/19/20 1157 .  folic acid injection 1 mg, 1 mg, Intravenous, Daily, Rai, Ripudeep K, MD, 1 mg at 12/19/20 1135 .  gabapentin (NEURONTIN) 250 MG/5ML solution 100 mg, 100 mg, Oral, Q8H, Rai, Ripudeep K, MD, 100 mg at 12/19/20 1520 .  HYDROmorphone (DILAUDID) injection 0.5 mg, 0.5 mg, Intravenous, Q4H PRN, Zannie Cove, MD, 0.5 mg at 12/18/20 0916 .  magic mouthwash w/lidocaine, 5 mL, Oral, TID AC, Rai,  Ripudeep K, MD, 5 mL at 12/19/20 1122 .  menthol-cetylpyridinium (CEPACOL) lozenge 3 mg, 1 lozenge, Oral, PRN, Blount, Xenia T, NP, 3 mg at 12/14/20 1855 .  metoprolol tartrate (LOPRESSOR) injection 10 mg, 10 mg, Intravenous, Q6H, Rodolph Bong, MD, 10 mg at 12/19/20 1310 .  nystatin (MYCOSTATIN) 100000 UNIT/ML suspension 500,000 Units, 5 mL, Oral, QID, Rai, Ripudeep K, MD, 500,000 Units at 12/19/20 1520 .  ondansetron (ZOFRAN) tablet 8 mg, 8 mg, Oral, Q6H PRN, 8 mg at 12/13/20 0648 **OR** ondansetron (ZOFRAN) 8 mg in sodium chloride 0.9 % 50 mL IVPB, 8 mg, Intravenous, Q6H PRN, Bobette Mo, MD, Last Rate: 216 mL/hr at 12/15/20 1750, 8 mg at 12/15/20 1750 .  oxyCODONE (ROXICODONE) 5 MG/5ML solution 5 mg, 5 mg, Oral, Q4H PRN, Rai, Ripudeep K, MD, 5 mg at 12/19/20 1528 .  pantoprazole (PROTONIX) injection 40 mg, 40 mg, Intravenous, Q12H, Bobette Mo, MD, 40 mg at 12/19/20 1121 .  phenol (CHLORASEPTIC) mouth spray 1 spray, 1 spray, Mouth/Throat, PRN, Rai, Ripudeep K, MD .  prochlorperazine (COMPAZINE) injection 10 mg, 10 mg, Intravenous, Q8H, Rodolph Bong, MD, 10 mg at 12/19/20 1520 .  senna-docusate (Senokot-S) tablet 1 tablet, 1 tablet, Oral, BID, Rodolph Bong, MD, 1 tablet at 12/17/20 2108 .  simethicone (MYLICON) chewable tablet 160 mg, 160 mg, Oral, QID, Rodolph Bong, MD, 160 mg at 12/19/20 1121 .  sodium chloride flush (NS) 0.9 % injection 10-40 mL, 10-40 mL, Intracatheter, Q12H, Rai, Ripudeep K, MD, 20 mL at 12/19/20 1121 .  sodium chloride flush (NS) 0.9 % injection 10-40 mL, 10-40 mL, Intracatheter, PRN, Rai, Ripudeep K, MD  Allergies: Allergies  Allergen Reactions  . Azithromycin Shortness Of Breath and Nausea And Vomiting    Social History:  Lives alone; family has passed or lives in [place] Intersted in men Has some social support (some friends, has had one visitor in hospital)  Tobacco use: Denies Alcohol use: Denies Drug use:  Denies  Family History:  The patient's family history includes Hypertension in an other family member.    Objective  Vital signs:  Temp:  [98 F (36.7 C)-98.7 F (37.1 C)] 98.7 F (37.1 C) (09/26 1135) Pulse Rate:  [95-100] 100 (09/26 1135) Resp:  [14-22] 14 (09/26 1135) BP: (106-120)/(72-80) 120/79 (09/26 1135) SpO2:  [92 %-100 %] 92 % (09/26 1135)  Physical Exam: Gen: Obese, ill-appearing Neuro: minimal spontaneous movement noted, CN II-XII grossly intact Pulm: no increased WOB Skin: No rash/lesion/ulcer present in visible skin   Mental Status Exam: Appearance: Younger than stated age  Attitude:  Eager to please, cooperative  Behavior/Psychomotor: Childlike, minimal spontaneous movement  Speech/Language:  High pitched voice, minimal prosody  Mood: Sad, trying to keep up hope  Affect: dejected  Thought process: Linear, goal-directed  Thought content:   Devoid of SI, HI, possible (+) somatic delusions  Perceptual disturbances:  Denies AH/VH, not overtly RIS  Attention: Attended to conversation without difficulty, not formally assessed   Concentration: No difficulties noted, not formally assessed  Orientation: Oriented x4  Memory: Recent and remote intact   Fund of knowledge:  Not formally assessed  Insight:   fair  Judgment:  fair  Impulse Control: fair    Data Reviewed: Prior notes, imaging

## 2020-12-19 NOTE — Progress Notes (Signed)
UNASSIGNED PATIENT Subjective: Patient is a 30 year old black female with multiple medical problems including morbid obesity. NASH, GERD, depression and anxiety along with paresthesias and a fall multiple left knee dislocation with resulting MCL strain ACL and PCL rupture. She did struggle with nausea and vomiting for several weeks now since her admission early in August.  She tells me she is doing better from a GI standpoint today she tolerated her meals well she had some chicken and potatoes. She had a bowel movement last night.  Objective: Vital signs in last 24 hours: Temp:  [98 F (36.7 C)-98.7 F (37.1 C)] 98.7 F (37.1 C) (09/26 1135) Pulse Rate:  [95-100] 100 (09/26 1135) Resp:  [14-22] 14 (09/26 1135) BP: (106-120)/(72-80) 120/79 (09/26 1135) SpO2:  [92 %-100 %] 92 % (09/26 1135) Last BM Date:  (PTA)  Intake/Output from previous day: 09/25 0701 - 09/26 0700 In: 370 [P.O.:360; I.V.:10] Out: 450 [Urine:400; Emesis/NG output:50] Intake/Output this shift: No intake/output data recorded.  General appearance: cooperative, appears older than stated age, no distress, moderately obese, and pale Resp: clear to auscultation bilaterally Cardio: regular rate and rhythm, S1, S2 normal, no murmur, click, rub or gallop GI: soft, non-tender; bowel sounds normal; no masses,  no organomegaly  Lab Results: Recent Labs    12/17/20 0528  WBC 6.5  HGB 9.0*  HCT 29.5*  PLT 290   BMET Recent Labs    12/17/20 0528  NA 143  K 4.1  CL 108  CO2 24  GLUCOSE 114*  BUN 5*  CREATININE 0.52  CALCIUM 9.2   LFT Recent Labs    12/17/20 0528  PROT 7.5  ALBUMIN 3.1*  AST 143*  ALT 40  ALKPHOS 59  BILITOT 2.5*   Medications: I have reviewed the patient's current medications.  Assessment/Plan: 1) Nausea and vomiting seems to have improved but as she is on chronic opiates and IV Dilaudid this can cause opioid-induced gastroparesis/constipation. The use of all opioid should be  minimized. 2) GERD with small antral ulcer/reflux esophagitis on a recent EGD. 3) NASH with elevated LFT's. 4) Paresthesias due to B12 folate deficiency-she describes weakness in her arms and difficulty feeding herself. 5) E.coli UTI. 6) Recent left knee dislocation. 7) Morbid obesity. 8) Hypothyroidism. 9) Anxiety and depression ?conversion disorder.  LOS: 9 days   Charna Elizabeth 12/19/2020, 4:37 PM

## 2020-12-19 NOTE — Progress Notes (Signed)
[facility]                                                                              Patient Demographics  [place] Welcome, is a 30 y.o. female, DOB - 07-16-1990, JYN:[id number]  Admit date - 12/09/2020   Admitting Physician No admitting provider for patient encounter.  Outpatient Primary MD for the patient is Patient, No Pcp Per (Inactive)  Outpatient specialists:   LOS - 9  days   Medical records reviewed and are as summarized below:    Chief Complaint  Patient presents with   Emesis   Nausea       Brief summary   Patient is a 30 year old female with morbid obesity, nonalcoholic fatty liver disease, GERD, depression, recently was hospitalized following a mechanical fall with a left knee dislocation, resultant MCL strain, ACL and PCL rupture then complicated by nausea vomiting, tachycardia, UTI and fecal impaction during the hospitalization. Patient had an endoscopy which noted small antral ulcer on 8/27, RUQ ultrasound was unremarkable.  Patient presented to ED with multiple episodes of nausea, vomiting, aches and pains everywhere. In ED, afebrile, mildly tachycardic, hypertensive.  Labs showed mildly abnormal AST, ALT, bilirubin of 2.  Assessment & Plan    Principal Problem:   Intractable vomiting with nausea -Possibly due to gastroparesis, esophagitis -Patient noted to have prolonged course of nausea and vomiting during recent hospitalization. EGD 11/19/2020 had shown small antral ulcer.  CT abdomen and pelvis with hepatomegaly fatty liver disease, no ductal dilatation or gallstones. -Abd [place] 9/20 showed gallbladder distended with sludge but no cholelithiasis, no wall thickening or pericholecystic fluid -CT angio abd-pelvis on 9/16 showed no vascular pathology, no acute localizing process in abdomen and pelvis.  Hepatomegaly with hepatic steatosis -HIDA scan negative -Stopped IV Ancef and Flagyl, reglan.  -Continue nystatin and lidocaine Magic  mouthwash, added 7 days of IV Diflucan  -States she is able to hold down food but has difficulty with pills  Paresthesias due to B12 deficiency, folate deficiency -Cortisol level normal, TSH 5.2, B12 very low of 153, folate 3.2, B1 pending -Started on B12 injections IM until patient able to tolerate p.o. pills -States having difficulty with tolerating oral folic acid tablet, placed on IV folic acid daily until able to tolerate p.o. consistently -Placed on Neurontin 100 mg 3 times daily for possible neuropathy -Seen by PT OT, concern for possible psychosomatic component, requested psychiatry consult  E. coli UTI -Urine culture showed more than 100,000 colonies of E. coli and 20,000 colonies of Proteus mirabilis -IV Ancef and Flagyl discontinued  Recent left knee dislocation -Followed by orthopedics, appreciate recommendations by Dr. Magnus Ivan  -Recommended continue brace for another 2 to 4 weeks  -Consult placed to CIR  Sinus tachycardia -Continue beta-blocker IV  Hypothyroidism -TSH 5.2, free T4 1.3  GERD -Continue PPI  Iron deficiency anemia -History of heavy menorrhagia on her cycle, -Hemoglobin stable, 9.0  Morbid obesity Estimated body mass index is 73.91 kg/m as calculated from the following:   Height as of this encounter:  (1.626 m).   Weight as of this encounter: 195.3 kg.  Code Status: Full DVT  Prophylaxis:  SCDs Start: 12/09/20 1937   Level of Care: Level of care: Telemetry Family Communication: Discussed all imaging results, lab results, explained to the patient   Disposition Plan:     Status is: Inpatient.  Unclear disposition, does not appear to be motivated, states lives alone, has no family support, concern if she has underlying depression   Remains inpatient appropriate because:Inpatient level of care appropriate due to severity of illness  Dispo: The patient is from: Home              Anticipated d/c is to: SNF              Patient currently is  not medically stable to d/c.    Difficult to place patient No   Time Spent in minutes: 25 minutes  Procedures:  None  Consultants:   Gastroenterology Psychiatry  Antimicrobials:   Anti-infectives (From admission, onward)    Start     Dose/Rate Route Frequency Ordered Stop   12/17/20 1100  fluconazole (DIFLUCAN) IVPB 100 mg        100 mg 50 mL/hr over 60 Minutes Intravenous Daily 12/17/20 1046 12/24/20 0959   12/13/20 2200  metroNIDAZOLE (FLAGYL) IVPB 500 mg  Status:  Discontinued        500 mg 100 mL/hr over 60 Minutes Intravenous Every 8 hours 12/13/20 2107 12/16/20 1620   12/13/20 1000  ceFAZolin (ANCEF) IVPB 2g/100 mL premix  Status:  Discontinued        2 g 200 mL/hr over 30 Minutes Intravenous Every 8 hours 12/13/20 0927 12/16/20 1721   12/12/20 1000  cefTRIAXone (ROCEPHIN) 2 g in sodium chloride 0.9 % 100 mL IVPB  Status:  Discontinued        2 g 200 mL/hr over 30 Minutes Intravenous Every 24 hours 12/12/20 0901 12/13/20 0928   12/11/20 0845  cefTRIAXone (ROCEPHIN) 1 g in sodium chloride 0.9 % 100 mL IVPB  Status:  Discontinued        1 g 200 mL/hr over 30 Minutes Intravenous Every 24 hours 12/11/20 0758 12/12/20 0901          Medications  Scheduled Meds:  bisacodyl  10 mg Rectal Once   ciprofloxacin  2 drop Right Eye Q4H while awake   cyanocobalamin  1,000 mcg Intramuscular Daily   folic acid  1 mg Intravenous Daily   gabapentin  100 mg Oral Q8H   magic mouthwash w/lidocaine  5 mL Oral TID AC   metoprolol tartrate  10 mg Intravenous Q6H   nystatin  5 mL Oral QID   pantoprazole (PROTONIX) IV  40 mg Intravenous Q12H   prochlorperazine  10 mg Intravenous Q8H   senna-docusate  1 tablet Oral BID   simethicone  160 mg Oral QID   sodium chloride flush  10-40 mL Intracatheter Q12H   Continuous Infusions:  fluconazole (DIFLUCAN) IV 100 mg (12/19/20 1157)   ondansetron (ZOFRAN) IV 8 mg (12/15/20 1750)   PRN Meds:.acetaminophen **OR** acetaminophen,  diphenhydrAMINE, HYDROmorphone (DILAUDID) injection, menthol-cetylpyridinium, ondansetron **OR** ondansetron (ZOFRAN) IV, oxyCODONE, phenol, sodium chloride flush      Subjective:   [name] was seen and examined today.  Multiple complaints, having difficulty feeding and holding cup, tolerating diet, having difficulty with the pills.    Objective:   Vitals:   12/18/20 1324 12/18/20 1949 12/19/20 0417 12/19/20 1135  BP: 120/78 106/80 110/72 120/79  Pulse: (!) 106 95 100 100  Resp: 18 20 (!) 22 14  Temp:  98 F (36.7 C) 98.1 F (36.7 C) 98.7 F (37.1 C)  TempSrc:  Oral Oral Oral  SpO2: 97% 100% 95% 92%  Weight:      Height:        Intake/Output Summary (Last 24 hours) at 12/19/2020 1210 Last data filed at 12/19/2020 0430 Gross per 24 hour  Intake 250 ml  Output 350 ml  Net -100 ml      Wt Readings from Last 3 Encounters:  12/18/20 (!) 195.3 kg  11/01/20 (!) 181.4 kg  10/27/13 (!) 161.9 kg    Physical Exam General: Alert and oriented x 3, NAD Cardiovascular: S1 S2 clear, RRR. No pedal edema b/l Respiratory: CTAB, no wheezing Gastrointestinal: Obese, soft, nontender, nondistended, NBS Ext: no pedal edema bilaterally Neuro: no new deficits Psych: Normal affect and demeanor, alert and oriented x3    Data Reviewed:  I have personally reviewed following labs and imaging studies  Micro Results Recent Results (from the past 240 hour(s))  Resp Panel by RT-PCR (Flu A&B, Covid) Nasopharyngeal Swab     Status: None   Collection Time: 12/09/20  8:10 PM   Specimen: Nasopharyngeal Swab; Nasopharyngeal(NP) swabs in vial transport medium  Result Value Ref Range Status   SARS Coronavirus 2 by RT PCR NEGATIVE NEGATIVE Final    Comment: (NOTE) SARS-CoV-2 target nucleic acids are NOT DETECTED.  The SARS-CoV-2 RNA is generally detectable in upper respiratory specimens during the acute phase of infection. The lowest concentration of SARS-CoV-2 viral copies this assay  can detect is 138 copies/mL. A negative result does not preclude SARS-Cov-2 infection and should not be used as the sole basis for treatment or other patient management decisions. A negative result may occur with  improper specimen collection/handling, submission of specimen other than nasopharyngeal swab, presence of viral mutation(s) within the areas targeted by this assay, and inadequate number of viral copies(<138 copies/mL). A negative result must be combined with clinical observations, patient history, and epidemiological information. The expected result is Negative.  Fact Sheet for Patients:  BloggerCourse.com  Fact Sheet for Healthcare Providers:  SeriousBroker.it  This test is no t yet approved or cleared by the [place] FDA and  has been authorized for detection and/or diagnosis of SARS-CoV-2 by FDA under an Emergency Use Authorization (EUA). This EUA will remain  in effect (meaning this test can be used) for the duration of the COVID-19 declaration under Section 564(b)(1) of the Act, 21 U.S.C.section 360bbb-3(b)(1), unless the authorization is terminated  or revoked sooner.       Influenza A by PCR NEGATIVE NEGATIVE Final   Influenza B by PCR NEGATIVE NEGATIVE Final    Comment: (NOTE) The Xpert Xpress SARS-CoV-2/FLU/RSV plus assay is intended as an aid in the diagnosis of influenza from Nasopharyngeal swab specimens and should not be used as a sole basis for treatment. Nasal washings and aspirates are unacceptable for Xpert Xpress SARS-CoV-2/FLU/RSV testing.  Fact Sheet for Patients: BloggerCourse.com  Fact Sheet for Healthcare Providers: SeriousBroker.it  This test is not yet approved or cleared by the [place] FDA and has been authorized for detection and/or diagnosis of SARS-CoV-2 by FDA under an Emergency Use Authorization (EUA). This EUA will  remain in effect (meaning this test can be used) for the duration of the COVID-19 declaration under Section 564(b)(1) of the Act, 21 U.S.C. section 360bbb-3(b)(1), unless the authorization is terminated or revoked.  Performed at [facility], [address], [place], [place] [address]   Urine Culture  Status: Abnormal   Collection Time: 12/11/20 12:37 AM   Specimen: Urine, Random  Result Value Ref Range Status   Specimen Description   Final    URINE, RANDOM Performed at [facility], [address], [place], [place] [address]    Special Requests   Final    NONE Performed at [facility], [address], [place], [place] [address]    Culture (A)  Final    >=100,000 COLONIES/mL ESCHERICHIA COLI 20,000 COLONIES/mL PROTEUS MIRABILIS    Report Status 12/13/2020 FINAL  Final   Organism ID, Bacteria ESCHERICHIA COLI (A)  Final   Organism ID, Bacteria PROTEUS MIRABILIS (A)  Final      Susceptibility   Escherichia coli - MIC*    AMPICILLIN >=32 RESISTANT Resistant     CEFAZOLIN <=4 SENSITIVE Sensitive     CEFEPIME <=0.12 SENSITIVE Sensitive     CEFTRIAXONE <=0.25 SENSITIVE Sensitive     CIPROFLOXACIN <=0.25 SENSITIVE Sensitive     GENTAMICIN <=1 SENSITIVE Sensitive     IMIPENEM <=0.25 SENSITIVE Sensitive     NITROFURANTOIN 32 SENSITIVE Sensitive     TRIMETH/SULFA <=20 SENSITIVE Sensitive     AMPICILLIN/SULBACTAM 16 INTERMEDIATE Intermediate     PIP/TAZO <=4 SENSITIVE Sensitive     * >=100,000 COLONIES/mL ESCHERICHIA COLI   Proteus mirabilis - MIC*    AMPICILLIN <=2 SENSITIVE Sensitive     CEFAZOLIN <=4 SENSITIVE Sensitive     CEFEPIME <=0.12 SENSITIVE Sensitive     CEFTRIAXONE <=0.25 SENSITIVE Sensitive     CIPROFLOXACIN <=0.25 SENSITIVE Sensitive     GENTAMICIN <=1 SENSITIVE Sensitive     IMIPENEM 2 SENSITIVE Sensitive     NITROFURANTOIN RESISTANT Resistant     TRIMETH/SULFA <=20 SENSITIVE Sensitive      AMPICILLIN/SULBACTAM <=2 SENSITIVE Sensitive     PIP/TAZO <=4 SENSITIVE Sensitive     * 20,000 COLONIES/mL PROTEUS MIRABILIS    Radiology Reports DG Chest 1 View  Result Date: 12/11/2020 CLINICAL DATA:  Shortness of breath EXAM: CHEST  1 VIEW COMPARISON:  05/16/2018. FINDINGS: Evaluation is limited by body habitus and positioning. Low lung volumes. The heart is likely normal in size, given projection. No focal pulmonary opacity. No definite pleural effusion or pneumothorax. No acute osseous abnormality. IMPRESSION: Evaluation is limited by body habitus, positioning, and low lung volumes. Within this limitation, no acute cardiopulmonary process. Electronically Signed   By: Wiliam Ke M.D.   On: 12/11/2020 21:48   US Abdomen Complete  Result Date: 12/13/2020 CLINICAL DATA:  Elevated LFTs and nausea and vomiting EXAM: ABDOMEN ULTRASOUND COMPLETE COMPARISON:  Ultrasound from 11/20/2020 and CT from 12/09/2020 FINDINGS: Gallbladder: Gallbladder is well distended with evidence of sludge. No cholelithiasis is noted. No wall thickening or pericholecystic fluid is noted. Common bile duct: Diameter: 3 mm Liver: Diffuse increased echogenicity is noted without focal mass consistent with fatty infiltration. This is similar to that seen on prior CT examination. Portal vein is patent on color Doppler imaging with normal direction of blood flow towards the liver. IVC: No abnormality visualized. Pancreas: Visualized portion unremarkable. Spleen: Size and appearance within normal limits. Right Kidney: Length: 13.9 cm. Echogenicity within normal limits. No mass or hydronephrosis visualized. Left Kidney: Length: 12.4 cm. Echogenicity within normal limits. No mass or hydronephrosis visualized. Abdominal aorta: No aneurysm visualized. Other findings: None. IMPRESSION: Gallbladder sludge. Fatty infiltration of the liver. No other focal abnormality is noted. Electronically Signed   By: Alcide Clever M.D.   On: 12/13/2020 20:30  NM Hepato W/EF  Result Date: 12/16/2020 CLINICAL DATA:  Nausea and vomiting. Gallbladder sludge. Elevated bilirubin EXAM: NUCLEAR MEDICINE HEPATOBILIARY IMAGING WITH GALLBLADDER EF TECHNIQUE: Sequential images of the abdomen were obtained out to 60 minutes following intravenous administration of radiopharmaceutical. After oral ingestion of Ensure, gallbladder ejection fraction was determined. At 60 min, normal ejection fraction is greater than 33%. RADIOPHARMACEUTICALS:  7.2 mCi Tc-55m  Choletec IV COMPARISON:  Ultrasound 12/13/2020, CT 12/09/2020 FINDINGS: There is prompt clearance radiotracer from the blood pool but some delay in clearance into the biliary tree presumably related to elevated bilirubin. The gallbladder fills by 60 minutes. Small amount of counts are present in the small bowel. Fatty meal was administered and the gallbladder contracted near completely prior to re-imaging. Calculated gallbladder ejection fraction is 99%. (Normal gallbladder ejection fraction with Ensure is greater than 33%.) IMPRESSION: 1. Patent cystic duct and common bile duct. Filling of the gallbladder. 2. Rapid emptying of the gallbladder.  99% ejection fraction. 3. Slow transient radiotracer into the biliary tree and small bowel related to elevated bilirubin. Electronically Signed   By: Genevive Bi M.D.   On: 12/16/2020 12:27   DG Knee Left Port  Result Date: 12/13/2020 CLINICAL DATA:  History of dislocation of knee. EXAM: PORTABLE LEFT KNEE - 1-2 VIEW COMPARISON:  Most recent radiograph 11/21/2020 FINDINGS: Proximal fibular fracture is less well-defined on the current exam, unclear if this is related to differences in technique or interval healing. There is a small fracture fragment projects lateral to the lateral tibial plateau. Fracture fragment arising from the medial femoral condyle is faintly visualized. Narrowing of the medial and widening of the lateral tibiofemoral compartments. Patellofemoral  relationship is not well-defined on the current exam due to habitus and positioning. IMPRESSION: 1. Proximal fibular fracture is less well-defined on the current exam, unclear if this is related to differences in technique or interval healing. Small fracture fragment projects lateral to the lateral tibial plateau. 2. Medial femoral condyle fracture fragment is faintly visualized. 3. Narrowing of the medial and widening of the lateral tibiofemoral compartments, possibly due to ligamentous injury. Electronically Signed   By: Narda Rutherford M.D.   On: 12/13/2020 10:36   DG Knee Left Port  Result Date: 11/21/2020 CLINICAL DATA:  Knee injury, fall a couple weeks ago EXAM: PORTABLE LEFT KNEE - 1-2 VIEW COMPARISON:  None. FINDINGS: No evidence of fracture, dislocation, or joint effusion. No evidence of arthropathy or other focal bone abnormality. Soft tissues are unremarkable. IMPRESSION: No fracture or dislocation of the left knee. Joint spaces are preserved. Electronically Signed   By: Lauralyn Primes M.D.   On: 11/21/2020 19:15   DG ABD ACUTE 2+V W 1V CHEST  Result Date: 12/13/2020 CLINICAL DATA:  Nausea and vomiting. EXAM: DG ABDOMEN ACUTE WITH 1 VIEW CHEST COMPARISON:  CT 12/09/2020 FINDINGS: Evaluation is limited due to body habitus. Shallow inspiration with elevation of the right hemidiaphragm. Linear areas of infiltration or atelectasis in the right mid and lower lungs. Left lung is clear. Heart size and pulmonary vascularity are normal. No free air under the hemidiaphragms. Gas and stool throughout the colon. No small or large bowel distention is appreciated. No radiopaque stones are identified. IMPRESSION: 1. Shallow inspiration with elevation of the right hemidiaphragm. Infiltration or atelectasis in the right lung. Possibly pneumonia. 2. Nonobstructive bowel gas pattern with gas-filled colon. Electronically Signed   By: Burman Nieves M.D.   On: 12/13/2020 20:11   CT Angio Abd/Pel W and/or Wo  Contrast  Result Date: 12/09/2020 CLINICAL DATA:  Concern for chronic mesenteric ischemia. EXAM: CTA ABDOMEN AND PELVIS WITHOUT AND WITH CONTRAST TECHNIQUE: Multidetector CT imaging of the abdomen and pelvis was performed using the standard protocol during bolus administration of intravenous contrast. Multiplanar reconstructed images and MIPs were obtained and reviewed to evaluate the vascular anatomy. CONTRAST:  OMNIPAQUE IOHEXOL 350 MG/ML SOLN COMPARISON:  CT abdomen and pelvis 06/08/2016. FINDINGS: VASCULAR Aorta: Normal caliber aorta without aneurysm, dissection, vasculitis or significant stenosis. Celiac: Patent without evidence of aneurysm, dissection, vasculitis or significant stenosis. SMA: Patent without evidence of aneurysm, dissection, vasculitis or significant stenosis. Renals: Both renal arteries are patent without evidence of aneurysm, dissection, vasculitis, fibromuscular dysplasia or significant stenosis. IMA: Patent without evidence of aneurysm, dissection, vasculitis or significant stenosis. Inflow: Patent without evidence of aneurysm, dissection, vasculitis or significant stenosis. Proximal Outflow: Bilateral common femoral and visualized portions of the superficial and profunda femoral arteries are patent without evidence of aneurysm, dissection, vasculitis or significant stenosis. Veins: No obvious venous abnormality within the limitations of this arterial phase study. Review of the MIP images confirms the above findings. NON-VASCULAR Lower chest: No acute abnormality. Hepatobiliary: Liver is enlarged. There is diffuse fatty infiltration of the liver. No calcified gallstones are seen. There is no biliary ductal dilatation identified. Pancreas: Unremarkable. No pancreatic ductal dilatation or surrounding inflammatory changes. Spleen: Normal in size without focal abnormality. Adrenals/Urinary Tract: Adrenal glands are unremarkable. Kidneys are normal, without renal calculi, focal lesion,  or hydronephrosis. Bladder is unremarkable. Stomach/Bowel: Stomach is within normal limits. Appendix appears normal. No evidence of bowel wall thickening, distention, or inflammatory changes. Lymphatic: No significant vascular findings are present. No enlarged abdominal or pelvic lymph nodes. Reproductive: Uterus and bilateral adnexa are unremarkable. Other: No abdominal wall hernia or abnormality. No abdominopelvic ascites. Musculoskeletal: No acute or significant osseous findings. IMPRESSION: VASCULAR 1. No acute vascular pathology identified. NON-VASCULAR 1. No acute localizing process in the abdomen or pelvis. 2. Hepatomegaly and hepatic steatosis. Electronically Signed   By: Darliss Cheney M.D.   On: 12/09/2020 19:22   US Abdomen Limited RUQ (LIVER/GB)  Result Date: 11/20/2020 CLINICAL DATA:  Cholelithiasis. EXAM: ULTRASOUND ABDOMEN LIMITED RIGHT UPPER QUADRANT COMPARISON:  None. FINDINGS: Gallbladder: No gallstones or wall thickening visualized (2.5 mm). No sonographic Murphy sign noted by sonographer. Common bile duct: Diameter: 5.1 mm Liver: No focal lesion identified. Within normal limits in parenchymal echogenicity. Portal vein is patent on color Doppler imaging with normal direction of blood flow towards the liver. Other: None. IMPRESSION: Normal right upper quadrant ultrasound. Electronically Signed   By: Aram Candela M.D.   On: 11/20/2020 00:54    Lab Data:  CBC: Recent Labs  Lab 12/13/20 0523 12/14/20 0549 12/15/20 0520 12/17/20 0528  WBC 4.8 4.9 6.7 6.5  NEUTROABS 2.2 2.6  --   --   HGB 8.8* 9.6* 9.1* 9.0*  HCT 28.6* 30.9* 29.6* 29.5*  MCV 99.3 100.3* 98.7 100.3*  PLT 269 254 300 290   Basic Metabolic Panel: Recent Labs  Lab 12/13/20 0523 12/14/20 0703 12/15/20 0520 12/16/20 0446 12/17/20 0528  NA 140 142 141 138 143  K 3.8 3.6 3.6 4.2 4.1  CL 105 105 108 104 108  CO2 28 26 26 22 24   GLUCOSE 92 131* 101* 96 114*  BUN 6 <5* <5* 5* 5*  CREATININE 0.49 0.45 0.68  0.55 0.52  CALCIUM 8.5* 8.7* 9.1 8.6* 9.2  MG 1.9 2.0  --   --   --  PHOS  --  2.9  --   --   --    GFR: Estimated Creatinine Clearance: 178.4 mL/min (by C-G formula based on SCr of 0.52 mg/dL). Liver Function Tests: Recent Labs  Lab 12/13/20 0523 12/14/20 0703 12/15/20 0520 12/16/20 0446 12/17/20 0528  AST 130* 135* 119* 138* 143*  ALT 51* 53* 45* 33 40  ALKPHOS 50 58 57 51 59  BILITOT 1.6* 1.7* 2.1* 2.4* 2.5*  PROT 7.1 7.4 7.5 7.1 7.5  ALBUMIN 3.0* 3.1* 3.2* 3.1* 3.1*   Recent Labs  Lab 12/15/20 0520  LIPASE 25   No results for input(s): AMMONIA in the last 168 hours. Coagulation Profile: No results for input(s): INR, PROTIME in the last 168 hours. Cardiac Enzymes: No results for input(s): CKTOTAL, CKMB, CKMBINDEX, TROPONINI in the last 168 hours. BNP (last 3 results) No results for input(s): PROBNP in the last 8760 hours. HbA1C: No results for input(s): HGBA1C in the last 72 hours. CBG: Recent Labs  Lab 12/13/20 1104  GLUCAP 96   Lipid Profile: No results for input(s): CHOL, HDL, LDLCALC, TRIG, CHOLHDL, LDLDIRECT in the last 72 hours. Thyroid Function Tests: Recent Labs    12/16/20 1900  TSH 5.227*  FREET4 1.33*   Anemia Panel: Recent Labs    12/16/20 1900  VITAMINB12 153*  FOLATE 3.2*   Urine analysis:    Component Value Date/Time   COLORURINE AMBER (A) 12/11/2020 0037   APPEARANCEUR CLOUDY (A) 12/11/2020 0037   LABSPEC 1.020 12/11/2020 0037   PHURINE 5.0 12/11/2020 0037   GLUCOSEU NEGATIVE 12/11/2020 0037   HGBUR LARGE (A) 12/11/2020 0037   BILIRUBINUR NEGATIVE 12/11/2020 0037   KETONESUR 5 (A) 12/11/2020 0037   PROTEINUR 100 (A) 12/11/2020 0037   UROBILINOGEN 1.0 10/27/2013 2116   NITRITE NEGATIVE 12/11/2020 0037   LEUKOCYTESUR LARGE (A) 12/11/2020 0037     Katora Fini M.D. [facility] 12/19/2020, 12:10 PM  Available via Epic secure chat 7am-7pm After 7 pm, please refer to night coverage provider listed on amion.

## 2020-12-19 NOTE — Progress Notes (Signed)
Inpatient Rehabilitation Admissions Coordinator  I continue to follow pt's progress from last week's assessment. She has no caregiver supports at home and a decrease in functional status.  She is currently not at a level to pursue CIR level rehab , no caregivers supports with frequent admits. She will  likely need to be SNF and will be a difficult to place patient again as she was last admission.   Ottie Glazier, RN, MSN Rehab Admissions Coordinator [phone] 12/19/2020 1:22 PM

## 2020-12-20 ENCOUNTER — Inpatient Hospital Stay (HOSPITAL_COMMUNITY): Payer: Medicaid Other

## 2020-12-20 MED ORDER — ADULT MULTIVITAMIN LIQUID CH
15.0000 mL | Freq: Every day | ORAL | Status: DC
  Administered 2020-12-21 – 2021-01-09 (×17): 15 mL via ORAL
  Filled 2020-12-20 (×22): qty 15

## 2020-12-20 MED ORDER — LORAZEPAM 2 MG/ML IJ SOLN
1.0000 mg | Freq: Once | INTRAMUSCULAR | Status: AC
  Administered 2020-12-20: 1 mg via INTRAVENOUS
  Filled 2020-12-20: qty 1

## 2020-12-20 NOTE — Plan of Care (Signed)

## 2020-12-20 NOTE — Progress Notes (Signed)
[facility]                                                                              Patient Demographics  Carla Little, is a 30 y.o. female, DOB - 03-04-1991, ZOX:[id number]  Admit date - 12/09/2020   Admitting Physician No admitting provider for patient encounter.  Outpatient Primary MD for the patient is Patient, No Pcp Per (Inactive)  Outpatient specialists:   LOS - 10  days   Medical records reviewed and are as summarized below:    Chief Complaint  Patient presents with   Emesis   Nausea       Brief summary   Patient is a 30 year old female with morbid obesity, nonalcoholic fatty liver disease, GERD, depression, recently was hospitalized following a mechanical fall with a left knee dislocation, resultant MCL strain, ACL and PCL rupture then complicated by nausea vomiting, tachycardia, UTI and fecal impaction during the hospitalization. Patient had an endoscopy which noted small antral ulcer on 8/27, RUQ ultrasound was unremarkable.  Patient presented to ED with multiple episodes of nausea, vomiting, aches and pains everywhere. In ED, afebrile, mildly tachycardic, hypertensive.  Labs showed mildly abnormal AST, ALT, bilirubin of 2.  Interval summary 9/21 -9/27 -Complain of intractable nausea and vomiting and unable to tolerate diet.  HIDA scan negative.  CT angiogram abdomen and pelvis was negative.  GI was consulted, no further work-up recommended, extensive work-up during previous admission. -Patient then subsequently complained of paresthesias and perioral numbness.  Found to have severe B12 and folate deficiency.  Started on B12 and folate replacement. -Due to possible psychosomatic component (lives alone, no family support, underlying depression) psychiatry was consulted.  Despite B12 and folate replacement, patient continued to complain of paresthesias and numbness neurology was consulted, seen by Dr. Amada Jupiter today (recommended MRI brain,  C-spine, fluoroscopy guided lumbar puncture to rule out MS)  -PT recommended SNF  Assessment & Plan    Principal Problem:   Intractable vomiting with nausea -Possibly due to gastroparesis, esophagitis -Patient noted to have prolonged course of nausea and vomiting during recent hospitalization. EGD 11/19/2020 had shown small antral ulcer.  CT abdomen and pelvis with hepatomegaly fatty liver disease, no ductal dilatation or gallstones. -Abd [place] 9/20 showed gallbladder distended with sludge but no cholelithiasis, no wall thickening or pericholecystic fluid -CT angio abd-pelvis on 9/16 showed no vascular pathology, no acute localizing process in abdomen and pelvis.  Hepatomegaly with hepatic steatosis -HIDA scan negative -Stopped IV Ancef and Flagyl, reglan.  -Continue nystatin and lidocaine Magic mouthwash, added 7 days of IV Diflucan  -She is now able to tolerate diet, states intermittently has difficulty with pills.  Paresthesias likely due to B12 deficiency, folate deficiency -Cortisol level normal, TSH 5.2, B12 very low of 153, folate 3.2, B1 pending -Started on B12 injections IM until patient able to tolerate p.o. pills, continue folic acid -Placed on Neurontin 100 mg 3 times daily for possible neuropathy -Neurology consulted, seen by Dr. Amada Jupiter today, recommended MRI of the brain and C-spine to rule out MS and cervical radiculopathy.  Also recommended fluoroscopy guided lumbar puncture for further work-up.  E. coli  UTI -Urine culture showed more than 100,000 colonies of E. coli and 20,000 colonies of Proteus mirabilis -IV Ancef and Flagyl discontinued  Recent left knee dislocation -Followed by orthopedics, appreciate recommendations by Dr. Magnus Ivan  -Recommended continue brace for another 2 to 4 weeks  -PT recommending SNF  Sinus tachycardia -Continue beta-blocker IV  Hypothyroidism -TSH 5.2, free T4 1.3  GERD -Continue PPI  Iron deficiency anemia -History of heavy  menorrhagia on her cycle, -H&H stable  Morbid obesity Estimated body mass index is 75.12 kg/m as calculated from the following:   Height as of this encounter: 5\' 4"  (1.626 m).   Weight as of this encounter: 198.5 kg.  Code Status: Full DVT Prophylaxis:  SCDs Start: 12/09/20 1937   Level of Care: Level of care: Telemetry Family Communication: Discussed all imaging results, lab results, explained to the patient   Disposition Plan:     Status is: Inpatient.  Unclear disposition, does not appear to be motivated, states lives alone, has no family support, concern if she has underlying depression   Remains inpatient appropriate because:Inpatient level of care appropriate due to severity of illness  Dispo: The patient is from: Home              Anticipated d/c is to: SNF              Patient currently is not medically stable to d/c.  Work-up in progress, PT recommending SNF   Difficult to place patient No   Time Spent in minutes: 40 minutes  Procedures:  None  Consultants:   Gastroenterology Psychiatry Neurology  Antimicrobials:   Anti-infectives (From admission, onward)    Start     Dose/Rate Route Frequency Ordered Stop   12/17/20 1100  fluconazole (DIFLUCAN) IVPB 100 mg        100 mg 50 mL/hr over 60 Minutes Intravenous Daily 12/17/20 1046 12/24/20 0959   12/13/20 2200  metroNIDAZOLE (FLAGYL) IVPB 500 mg  Status:  Discontinued        500 mg 100 mL/hr over 60 Minutes Intravenous Every 8 hours 12/13/20 2107 12/16/20 1620   12/13/20 1000  ceFAZolin (ANCEF) IVPB 2g/100 mL premix  Status:  Discontinued        2 g 200 mL/hr over 30 Minutes Intravenous Every 8 hours 12/13/20 0927 12/16/20 1721   12/12/20 1000  cefTRIAXone (ROCEPHIN) 2 g in sodium chloride 0.9 % 100 mL IVPB  Status:  Discontinued        2 g 200 mL/hr over 30 Minutes Intravenous Every 24 hours 12/12/20 0901 12/13/20 0928   12/11/20 0845  cefTRIAXone (ROCEPHIN) 1 g in sodium chloride 0.9 % 100 mL IVPB  Status:   Discontinued        1 g 200 mL/hr over 30 Minutes Intravenous Every 24 hours 12/11/20 0758 12/12/20 0901          Medications  Scheduled Meds:  bisacodyl  10 mg Rectal Once   busPIRone  5 mg Oral q1800   ciprofloxacin  2 drop Right Eye Q4H while awake   cyanocobalamin  1,000 mcg Intramuscular Daily   folic acid  1 mg Intravenous Daily   gabapentin  100 mg Oral Q8H   LORazepam  1 mg Intravenous Once   magic mouthwash w/lidocaine  5 mL Oral TID AC   metoprolol tartrate  10 mg Intravenous Q6H   nystatin  5 mL Oral QID   pantoprazole (PROTONIX) IV  40 mg Intravenous Q12H   prochlorperazine  10  mg Intravenous Q8H   senna-docusate  1 tablet Oral BID   simethicone  160 mg Oral QID   sodium chloride flush  10-40 mL Intracatheter Q12H   Continuous Infusions:  fluconazole (DIFLUCAN) IV 100 mg (12/20/20 0900)   ondansetron (ZOFRAN) IV 8 mg (12/15/20 1750)   PRN Meds:.acetaminophen **OR** acetaminophen, diphenhydrAMINE, HYDROmorphone (DILAUDID) injection, menthol-cetylpyridinium, ondansetron **OR** ondansetron (ZOFRAN) IV, oxyCODONE, phenol, sodium chloride flush      Subjective:   Carla Little was seen and examined today.  States she did try to put effort yesterday and sat up in the bed twice.  Able to hold food and pills down.  Still feels numbness in the arms and paresthesias, perioral numbness.   Objective:   Vitals:   12/20/20 0441 12/20/20 1150 12/20/20 1314 12/20/20 1320  BP: 130/83 111/71 118/86   Pulse: (!) 103 (!) 105 94 92  Resp: 20     Temp: 99.5 F (37.5 C)  97.9 F (36.6 C)   TempSrc: Oral  Oral   SpO2: 95%  91% 100%  Weight:      Height:        Intake/Output Summary (Last 24 hours) at 12/20/2020 1614 Last data filed at 12/20/2020 0045 Gross per 24 hour  Intake 236 ml  Output 400 ml  Net -164 ml      Wt Readings from Last 3 Encounters:  12/20/20 (!) 198.5 kg  11/01/20 (!) 181.4 kg  10/27/13 (!) 161.9 kg   Physical Exam General: Alert and  oriented x 3, NAD Cardiovascular: S1 S2 clear, RRR. No pedal edema b/l Respiratory: CTAB, no wheezing Gastrointestinal: Soft, nontender, nondistended, NBS Ext: no pedal edema bilaterally Psych: Normal affect and demeanor, alert and oriented x3     Data Reviewed:  I have personally reviewed following labs and imaging studies  Micro Results Recent Results (from the past 240 hour(s))  Urine Culture     Status: Abnormal   Collection Time: 12/11/20 12:37 AM   Specimen: Urine, Random  Result Value Ref Range Status   Specimen Description   Final    URINE, RANDOM Performed at [facility], [address], [place], [place] [address]    Special Requests   Final    NONE Performed at [facility], [address] [place], [place] [address]    Culture (A)  Final    >=100,000 COLONIES/mL ESCHERICHIA COLI 20,000 COLONIES/mL PROTEUS MIRABILIS    Report Status 12/13/2020 FINAL  Final   Organism ID, Bacteria ESCHERICHIA COLI (A)  Final   Organism ID, Bacteria PROTEUS MIRABILIS (A)  Final      Susceptibility   Escherichia coli - MIC*    AMPICILLIN >=32 RESISTANT Resistant     CEFAZOLIN <=4 SENSITIVE Sensitive     CEFEPIME <=0.12 SENSITIVE Sensitive     CEFTRIAXONE <=0.25 SENSITIVE Sensitive     CIPROFLOXACIN <=0.25 SENSITIVE Sensitive     GENTAMICIN <=1 SENSITIVE Sensitive     IMIPENEM <=0.25 SENSITIVE Sensitive     NITROFURANTOIN 32 SENSITIVE Sensitive     TRIMETH/SULFA <=20 SENSITIVE Sensitive     AMPICILLIN/SULBACTAM 16 INTERMEDIATE Intermediate     PIP/TAZO <=4 SENSITIVE Sensitive     * >=100,000 COLONIES/mL ESCHERICHIA COLI   Proteus mirabilis - MIC*    AMPICILLIN <=2 SENSITIVE Sensitive     CEFAZOLIN <=4 SENSITIVE Sensitive     CEFEPIME <=0.12 SENSITIVE Sensitive     CEFTRIAXONE <=0.25 SENSITIVE Sensitive     CIPROFLOXACIN <=0.25 SENSITIVE Sensitive  GENTAMICIN <=1 SENSITIVE Sensitive     IMIPENEM 2 SENSITIVE Sensitive      NITROFURANTOIN RESISTANT Resistant     TRIMETH/SULFA <=20 SENSITIVE Sensitive     AMPICILLIN/SULBACTAM <=2 SENSITIVE Sensitive     PIP/TAZO <=4 SENSITIVE Sensitive     * 20,000 COLONIES/mL PROTEUS MIRABILIS    Radiology Reports DG Chest 1 View  Result Date: 12/11/2020 CLINICAL DATA:  Shortness of breath EXAM: CHEST  1 VIEW COMPARISON:  05/16/2018. FINDINGS: Evaluation is limited by body habitus and positioning. Low lung volumes. The heart is likely normal in size, given projection. No focal pulmonary opacity. No definite pleural effusion or pneumothorax. No acute osseous abnormality. IMPRESSION: Evaluation is limited by body habitus, positioning, and low lung volumes. Within this limitation, no acute cardiopulmonary process. Electronically Signed   By: Wiliam Ke M.D.   On: 12/11/2020 21:48   US Abdomen Complete  Result Date: 12/13/2020 CLINICAL DATA:  Elevated LFTs and nausea and vomiting EXAM: ABDOMEN ULTRASOUND COMPLETE COMPARISON:  Ultrasound from 11/20/2020 and CT from 12/09/2020 FINDINGS: Gallbladder: Gallbladder is well distended with evidence of sludge. No cholelithiasis is noted. No wall thickening or pericholecystic fluid is noted. Common bile duct: Diameter: 3 mm Liver: Diffuse increased echogenicity is noted without focal mass consistent with fatty infiltration. This is similar to that seen on prior CT examination. Portal vein is patent on color Doppler imaging with normal direction of blood flow towards the liver. IVC: No abnormality visualized. Pancreas: Visualized portion unremarkable. Spleen: Size and appearance within normal limits. Right Kidney: Length: 13.9 cm. Echogenicity within normal limits. No mass or hydronephrosis visualized. Left Kidney: Length: 12.4 cm. Echogenicity within normal limits. No mass or hydronephrosis visualized. Abdominal aorta: No aneurysm visualized. Other findings: None. IMPRESSION: Gallbladder sludge. Fatty infiltration of the liver. No other focal  abnormality is noted. Electronically Signed   By: Alcide Clever M.D.   On: 12/13/2020 20:30   NM Hepato W/EF  Result Date: 12/16/2020 CLINICAL DATA:  Nausea and vomiting. Gallbladder sludge. Elevated bilirubin EXAM: NUCLEAR MEDICINE HEPATOBILIARY IMAGING WITH GALLBLADDER EF TECHNIQUE: Sequential images of the abdomen were obtained out to 60 minutes following intravenous administration of radiopharmaceutical. After oral ingestion of Ensure, gallbladder ejection fraction was determined. At 60 min, normal ejection fraction is greater than 33%. RADIOPHARMACEUTICALS:  7.2 mCi Tc-79m  Choletec IV COMPARISON:  Ultrasound 12/13/2020, CT 12/09/2020 FINDINGS: There is prompt clearance radiotracer from the blood pool but some delay in clearance into the biliary tree presumably related to elevated bilirubin. The gallbladder fills by 60 minutes. Small amount of counts are present in the small bowel. Fatty meal was administered and the gallbladder contracted near completely prior to re-imaging. Calculated gallbladder ejection fraction is 99%. (Normal gallbladder ejection fraction with Ensure is greater than 33%.) IMPRESSION: 1. Patent cystic duct and common bile duct. Filling of the gallbladder. 2. Rapid emptying of the gallbladder.  99% ejection fraction. 3. Slow transient radiotracer into the biliary tree and small bowel related to elevated bilirubin. Electronically Signed   By: Genevive Bi M.D.   On: 12/16/2020 12:27   DG Knee Left Port  Result Date: 12/13/2020 CLINICAL DATA:  History of dislocation of knee. EXAM: PORTABLE LEFT KNEE - 1-2 VIEW COMPARISON:  Most recent radiograph 11/21/2020 FINDINGS: Proximal fibular fracture is less well-defined on the current exam, unclear if this is related to differences in technique or interval healing. There is a small fracture fragment projects lateral to the lateral tibial plateau. Fracture fragment arising from the medial femoral  condyle is faintly visualized. Narrowing of  the medial and widening of the lateral tibiofemoral compartments. Patellofemoral relationship is not well-defined on the current exam due to habitus and positioning. IMPRESSION: 1. Proximal fibular fracture is less well-defined on the current exam, unclear if this is related to differences in technique or interval healing. Small fracture fragment projects lateral to the lateral tibial plateau. 2. Medial femoral condyle fracture fragment is faintly visualized. 3. Narrowing of the medial and widening of the lateral tibiofemoral compartments, possibly due to ligamentous injury. Electronically Signed   By: Narda Rutherford M.D.   On: 12/13/2020 10:36   DG Knee Left Port  Result Date: 11/21/2020 CLINICAL DATA:  Knee injury, fall a couple weeks ago EXAM: PORTABLE LEFT KNEE - 1-2 VIEW COMPARISON:  None. FINDINGS: No evidence of fracture, dislocation, or joint effusion. No evidence of arthropathy or other focal bone abnormality. Soft tissues are unremarkable. IMPRESSION: No fracture or dislocation of the left knee. Joint spaces are preserved. Electronically Signed   By: Lauralyn Primes M.D.   On: 11/21/2020 19:15   DG ABD ACUTE 2+V W 1V CHEST  Result Date: 12/13/2020 CLINICAL DATA:  Nausea and vomiting. EXAM: DG ABDOMEN ACUTE WITH 1 VIEW CHEST COMPARISON:  CT 12/09/2020 FINDINGS: Evaluation is limited due to body habitus. Shallow inspiration with elevation of the right hemidiaphragm. Linear areas of infiltration or atelectasis in the right mid and lower lungs. Left lung is clear. Heart size and pulmonary vascularity are normal. No free air under the hemidiaphragms. Gas and stool throughout the colon. No small or large bowel distention is appreciated. No radiopaque stones are identified. IMPRESSION: 1. Shallow inspiration with elevation of the right hemidiaphragm. Infiltration or atelectasis in the right lung. Possibly pneumonia. 2. Nonobstructive bowel gas pattern with gas-filled colon. Electronically Signed   By:  Burman Nieves M.D.   On: 12/13/2020 20:11   CT Angio Abd/Pel W and/or Wo Contrast  Result Date: 12/09/2020 CLINICAL DATA:  Concern for chronic mesenteric ischemia. EXAM: CTA ABDOMEN AND PELVIS WITHOUT AND WITH CONTRAST TECHNIQUE: Multidetector CT imaging of the abdomen and pelvis was performed using the standard protocol during bolus administration of intravenous contrast. Multiplanar reconstructed images and MIPs were obtained and reviewed to evaluate the vascular anatomy. CONTRAST:  OMNIPAQUE IOHEXOL 350 MG/ML SOLN COMPARISON:  CT abdomen and pelvis 06/08/2016. FINDINGS: VASCULAR Aorta: Normal caliber aorta without aneurysm, dissection, vasculitis or significant stenosis. Celiac: Patent without evidence of aneurysm, dissection, vasculitis or significant stenosis. SMA: Patent without evidence of aneurysm, dissection, vasculitis or significant stenosis. Renals: Both renal arteries are patent without evidence of aneurysm, dissection, vasculitis, fibromuscular dysplasia or significant stenosis. IMA: Patent without evidence of aneurysm, dissection, vasculitis or significant stenosis. Inflow: Patent without evidence of aneurysm, dissection, vasculitis or significant stenosis. Proximal Outflow: Bilateral common femoral and visualized portions of the superficial and profunda femoral arteries are patent without evidence of aneurysm, dissection, vasculitis or significant stenosis. Veins: No obvious venous abnormality within the limitations of this arterial phase study. Review of the MIP images confirms the above findings. NON-VASCULAR Lower chest: No acute abnormality. Hepatobiliary: Liver is enlarged. There is diffuse fatty infiltration of the liver. No calcified gallstones are seen. There is no biliary ductal dilatation identified. Pancreas: Unremarkable. No pancreatic ductal dilatation or surrounding inflammatory changes. Spleen: Normal in size without focal abnormality. Adrenals/Urinary Tract: Adrenal  glands are unremarkable. Kidneys are normal, without renal calculi, focal lesion, or hydronephrosis. Bladder is unremarkable. Stomach/Bowel: Stomach is within normal limits. Appendix appears normal. No  evidence of bowel wall thickening, distention, or inflammatory changes. Lymphatic: No significant vascular findings are present. No enlarged abdominal or pelvic lymph nodes. Reproductive: Uterus and bilateral adnexa are unremarkable. Other: No abdominal wall hernia or abnormality. No abdominopelvic ascites. Musculoskeletal: No acute or significant osseous findings. IMPRESSION: VASCULAR 1. No acute vascular pathology identified. NON-VASCULAR 1. No acute localizing process in the abdomen or pelvis. 2. Hepatomegaly and hepatic steatosis. Electronically Signed   By: Darliss Cheney M.D.   On: 12/09/2020 19:22    Lab Data:  CBC: Recent Labs  Lab 12/14/20 0549 12/15/20 0520 12/17/20 0528  WBC 4.9 6.7 6.5  NEUTROABS 2.6  --   --   HGB 9.6* 9.1* 9.0*  HCT 30.9* 29.6* 29.5*  MCV 100.3* 98.7 100.3*  PLT 254 300 290   Basic Metabolic Panel: Recent Labs  Lab 12/14/20 0703 12/15/20 0520 12/16/20 0446 12/17/20 0528  NA 142 141 138 143  K 3.6 3.6 4.2 4.1  CL 105 108 104 108  CO2 26 26 22 24   GLUCOSE 131* 101* 96 114*  BUN <5* <5* 5* 5*  CREATININE 0.45 0.68 0.55 0.52  CALCIUM 8.7* 9.1 8.6* 9.2  MG 2.0  --   --   --   PHOS 2.9  --   --   --    GFR: Estimated Creatinine Clearance: 180.5 mL/min (by C-G formula based on SCr of 0.52 mg/dL). Liver Function Tests: Recent Labs  Lab 12/14/20 0703 12/15/20 0520 12/16/20 0446 12/17/20 0528  AST 135* 119* 138* 143*  ALT 53* 45* 33 40  ALKPHOS 58 57 51 59  BILITOT 1.7* 2.1* 2.4* 2.5*  PROT 7.4 7.5 7.1 7.5  ALBUMIN 3.1* 3.2* 3.1* 3.1*   Recent Labs  Lab 12/15/20 0520  LIPASE 25   No results for input(s): AMMONIA in the last 168 hours. Coagulation Profile: No results for input(s): INR, PROTIME in the last 168 hours. Cardiac Enzymes: No  results for input(s): CKTOTAL, CKMB, CKMBINDEX, TROPONINI in the last 168 hours. BNP (last 3 results) No results for input(s): PROBNP in the last 8760 hours. HbA1C: No results for input(s): HGBA1C in the last 72 hours. CBG: No results for input(s): GLUCAP in the last 168 hours.  Lipid Profile: No results for input(s): CHOL, HDL, LDLCALC, TRIG, CHOLHDL, LDLDIRECT in the last 72 hours. Thyroid Function Tests: No results for input(s): TSH, T4TOTAL, FREET4, T3FREE, THYROIDAB in the last 72 hours.  Anemia Panel: No results for input(s): VITAMINB12, FOLATE, FERRITIN, TIBC, IRON, RETICCTPCT in the last 72 hours.  Urine analysis:    Component Value Date/Time   COLORURINE AMBER (A) 12/11/2020 0037   APPEARANCEUR CLOUDY (A) 12/11/2020 0037   LABSPEC 1.020 12/11/2020 0037   PHURINE 5.0 12/11/2020 0037   GLUCOSEU NEGATIVE 12/11/2020 0037   HGBUR LARGE (A) 12/11/2020 0037   BILIRUBINUR NEGATIVE 12/11/2020 0037   KETONESUR 5 (A) 12/11/2020 0037   PROTEINUR 100 (A) 12/11/2020 0037   UROBILINOGEN 1.0 10/27/2013 2116   NITRITE NEGATIVE 12/11/2020 0037   LEUKOCYTESUR LARGE (A) 12/11/2020 0037     Ferlando Lia M.D. [facility] 12/20/2020, 4:14 PM  Available via Epic secure chat 7am-7pm After 7 pm, please refer to night coverage provider listed on amion.

## 2020-12-20 NOTE — Consult Note (Signed)
Neurology Consultation Reason for Consult: Paresthesias Referring Physician: Rai, R  CC: Numbness  History is obtained from:patient  HPI: Carla Little is a 30 y.o. female with a history of obesity, Carla Little who presented originally with a fall causing a knee dislocation.  She began having nausea and vomiting during that hospitalization.  She had been discharged, but represented with persistent nausea and vomiting.  She states that she thinks she started having some problems with numbness and tingling at that time(9/16), but did not think much of it since she has been having so much else going on.  She was also found to have an E. coli UTI.  She denies vision changes or diplopia.  She had a B12 and folate checked which were both found to be low  She had an EGD during that hospitalization, but no nitrous oxide was used according to the anesthesia event.  No other reported nitrous usage.   ROS: A 14 point ROS was performed and is negative except as noted in the HPI.  Past Medical History:  Diagnosis Date   Class 3 obesity 12/09/2020   Depression    GERD (gastroesophageal reflux disease)    Nonalcoholic steatohepatitis (NASH) 12/09/2020   Obesity    PUD (peptic ulcer disease) 12/09/2020     Family History  Problem Relation Age of Onset   Hypertension Other      Social History:  reports that she has never smoked. She has never used smokeless tobacco. She reports current alcohol use. She reports that she does not use drugs.   Exam: Current vital signs: BP 118/86 (BP Location: Left Wrist)   Pulse 92   Temp 97.9 F (36.6 C) (Oral)   Resp 20   Ht 5\' 4"  (1.626 m)   Wt (!) 198.5 kg   LMP 12/09/2020   SpO2 100%   BMI 75.12 kg/m  Vital signs in last 24 hours: Temp:  [97.9 F (36.6 C)-99.5 F (37.5 C)] 97.9 F (36.6 C) (09/27 1314) Pulse Rate:  [92-105] 92 (09/27 1320) Resp:  [18-20] 20 (09/27 0441) BP: (111-130)/(70-86) 118/86 (09/27 1314) SpO2:  [91 %-100 %] 100 % (09/27  1320) Weight:  [198.5 kg] 198.5 kg (09/27 0433)   Physical Exam  Constitutional: Appears well-developed and well-nourished.  Psych: Affect appropriate to situation Eyes: No scleral injection HENT: No OP obstruction MSK: no joint deformities.  Cardiovascular: Normal rate and regular rhythm.  Respiratory: Effort normal, non-labored breathing GI: Soft.  No distension. There is no tenderness.  Skin: WDI  Neuro: Mental Status: Patient is awake, alert, oriented to person, place, month, year, and situation. Patient is able to give a clear and coherent history. No signs of aphasia or neglect Cranial Nerves: II: Visual Fields are full. Pupils are equal, round, and reactive to light.   III,IV, VI: EOMI without ptosis or diploplia.  V: Facial sensation is diminished on the left more than right.  VII: Facial movement is symmetric.  VIII: hearing is intact to voice X: Uvula elevates symmetrically XI: Shoulder shrug is symmetric. XII: tongue is midline without atrophy or fasciculations.  Motor: 4/5 in bilateral ankles. She has difficulty lifting legs bilaterally, 3/5 though bulk of legs may be contributing Sensory: Sensation is diminished in bilateral legs to the toraso, in arms to above the elbow.  Deep Tendon Reflexes: absent Cerebellar: She is ataxic bilaterally      I have reviewed labs in epic and the results pertinent to this consultation are: B12 153 Folate 3.2  Impression: 29 year old female with diffuse hypoesthesia, areflexia, ataxia some distal weakness most consistent with an acute neuropathy.  The fact that she does feel some facial numbness is very unusual, however and I think that further evaluation with imaging would be prudent.  She will need an MRI of her cervical spine and brain.  Certainly, B12 deficiency and folate deficiency can cause neuropathy, though B12 much more commonly than folate deficiency.  Guillain-Barr syndrome is certainly a possibility, and if  she were to have an albuminocytologic dissociation, I would favor treating with IVIG.  Other nutritional deficiencies could be contributing as well, and I will check copper, vitamin E, B6 as well.  Recommendations: 1) copper, vitamin E, B6 2) MRI brain and cervical spine 3) LP to assess for albuminocytological dissociation 4) further recommendations pending above imaging/testing  Ritta Slot, MD [facility] [phone]  If 7pm- 7am, please page neurology on call as listed in AMION.

## 2020-12-20 NOTE — Progress Notes (Signed)
Physical Therapy Treatment Patient Details Name: Carla Little MRN: [id number] DOB: May 16, 1990 Today's Date: 12/20/2020   History of Present Illness Patient is a 30 y.o. female who presented to [facility] for intractable N/V on 9/17. ED labwork reveals e. coli UTI. Pt had recent hospital admission from 8/8-9/5 due to Lt knee dislocation which was reduced with fractures of the proximal fibula ligamentous avulsion laterally as well as medially off the medial femoral condyle and MRI showed complete ACL & PCL tears and MCL strain. Patient was placed in knee immobilizer throughout duration of admission with no knee flexion permitted. That hospital admission was complicated by nausea, vomiting, tachycardia, UTI, and fecal impaction; she was treated for nausea and vomiting with supportive care, had an endoscopy which noted a small antral ulcer on 8/27. As of 8/13 she was placed in a Lt Bledsoe brace with WBAT orders and no flexion of knee. PMH significant for morbid obesity, GERD, fatty liver disease, depression.    PT Comments    Attached orange level 2 theraband to pt's bedrails and instructed pt in  UE strengthening exercises. Also performed LE strengthening exercises. With quadriceps isometrics pt is only able to sustain a contraction for ~2 seconds.  Pt is now in bariatric air mattress bed, she reported she's uncomfortable, I increased the firmness of the mattress which she stated was more comfortable. Attempted supine to sit and sidelying to sit with +2 total assist but was unable to achieve upright sitting position due to pt weakness and body habitus. She was able to roll to the L with mod/max assist and reported sidelying was comfortable, so she remained in that position at end of session. She remains profoundly weak and continues to have difficulty with UE coordination.    Recommendations for follow up therapy are one component of a multi-disciplinary discharge planning process, led by the attending  physician.  Recommendations may be updated based on patient status, additional functional criteria and insurance authorization.  Follow Up Recommendations  Supervision for mobility/OOB;SNF     Equipment Recommendations  Rolling walker with 5" wheels;3in1 (PT);Wheelchair (measurements PT);Wheelchair cushion (measurements PT)    Recommendations for Other Services Rehab consult     Precautions / Restrictions Precautions Precautions: Fall Precaution Comments: PT Orders from Maureen Ralphs, MD; "WBAT Left LE, only up with a walker, does not need brace at this standpoint." Also in his note says if probalems with mobility - use brace again. Required Braces or Orthoses: Knee Immobilizer - Left Knee Immobilizer - Left: On at all times Restrictions Other Position/Activity Restrictions: PT Orders from Maureen Ralphs, MD; "WBAT Left LE, only up with a walker, does not need brace at this standpoint"     Mobility  Bed Mobility Overal bed mobility: Needs Assistance Bed Mobility: Rolling;Sidelying to Sit;Supine to Sit Rolling: +2 for physical assistance;Max assist Sidelying to sit: Total assist;+2 for physical assistance Supine to sit: HOB elevated;+2 for physical assistance;Total assist     General bed mobility comments: unable to achieve sitting position today with attempt from supine and an attempt from sidelying. Pt is now on bariatric air mattress and reports it is more difficult to move on the bed. Increased firmness of mattress which pt reported was helpful but still not able to come to full upright position with +2 total assist.    Transfers                    Ambulation/Gait  Stairs             Wheelchair Mobility    Modified Rankin (Stroke Patients Only)       Balance                                            Cognition Arousal/Alertness: Awake/alert Behavior During Therapy: Flat affect Overall Cognitive Status: Within  Functional Limits for tasks assessed                                 General Comments: pt seems depressed, flat affect, now having trouble with BUE coordination and tingling in BUE/LEs      Exercises General Exercises - Lower Extremity Ankle Circles/Pumps: AROM;Both;15 reps;Supine Quad Sets: AROM;Supine;Both;10 reps (pt has difficulty sustaining quad contraction for more than ~2 seconds) Heel Slides: AROM;Right;10 reps;AAROM Shoulder Exercises Shoulder Flexion: AAROM;Both;10 reps;Theraband Theraband Level (Shoulder Flexion): Level 2 (Red) Elbow Flexion: AAROM;Both;10 reps;Theraband Theraband Level (Elbow Flexion): Level 2 (Red) Elbow Extension: AAROM;Both;10 reps;Theraband Theraband Level (Elbow Extension): Level 2 (Red)    General Comments        Pertinent Vitals/Pain      Home Living                      Prior Function            PT Goals (current goals can now be found in the care plan section) Acute Rehab PT Goals Patient Stated Goal: to get better before going home PT Goal Formulation: With patient Time For Goal Achievement: 12/27/20 Potential to Achieve Goals: Good Progress towards PT goals: Not progressing toward goals - comment    Frequency    Min 5X/week      PT Plan Current plan remains appropriate    Co-evaluation              AM-PAC PT "6 Clicks" Mobility   Outcome Measure  Help needed turning from your back to your side while in a flat bed without using bedrails?: Total Help needed moving from lying on your back to sitting on the side of a flat bed without using bedrails?: Total Help needed moving to and from a bed to a chair (including a wheelchair)?: Total Help needed standing up from a chair using your arms (e.g., wheelchair or bedside chair)?: Total Help needed to walk in hospital room?: Total Help needed climbing 3-5 steps with a railing? : Total 6 Click Score: 6    End of Session   Activity Tolerance:  Patient limited by fatigue Patient left: in bed;with call bell/phone within reach Nurse Communication: Mobility status;Need for lift equipment (RN stated secretary has requested bariatric portable lift) PT Visit Diagnosis: Muscle weakness (generalized) (M62.81) Pain - Right/Left: Left Pain - part of body: Knee     Time: 2831-5176 PT Time Calculation (min) (ACUTE ONLY): 27 min  Charges:  $Therapeutic Exercise: 8-22 mins $Therapeutic Activity: 8-22 mins                     Ralene Bathe Kistler PT 12/20/2020  Acute Rehabilitation Services Pager [phone] Office [phone]

## 2020-12-21 ENCOUNTER — Inpatient Hospital Stay (HOSPITAL_COMMUNITY): Payer: Medicaid Other

## 2020-12-21 DIAGNOSIS — G610 Guillain-Barre syndrome: Secondary | ICD-10-CM | POA: Diagnosis present

## 2020-12-21 DIAGNOSIS — G629 Polyneuropathy, unspecified: Secondary | ICD-10-CM

## 2020-12-21 LAB — CSF CELL COUNT WITH DIFFERENTIAL
RBC Count, CSF: 500 /mm3 — ABNORMAL HIGH
Tube #: 1
WBC, CSF: 1 /mm3 (ref 0–5)

## 2020-12-21 LAB — COMPREHENSIVE METABOLIC PANEL
ALT: 85 U/L — ABNORMAL HIGH (ref 0–44)
AST: 246 U/L — ABNORMAL HIGH (ref 15–41)
Albumin: 2.9 g/dL — ABNORMAL LOW (ref 3.5–5.0)
Alkaline Phosphatase: 65 U/L (ref 38–126)
Anion gap: 7 (ref 5–15)
BUN: 9 mg/dL (ref 6–20)
CO2: 29 mmol/L (ref 22–32)
Calcium: 9.3 mg/dL (ref 8.9–10.3)
Chloride: 102 mmol/L (ref 98–111)
Creatinine, Ser: 0.35 mg/dL — ABNORMAL LOW (ref 0.44–1.00)
GFR, Estimated: >60 mL/min (ref >60)
Glucose, Bld: 128 mg/dL — ABNORMAL HIGH (ref 70–99)
Potassium: 3.1 mmol/L — ABNORMAL LOW (ref 3.5–5.1)
Sodium: 138 mmol/L (ref 135–145)
Total Bilirubin: 3.7 mg/dL — ABNORMAL HIGH (ref 0.3–1.2)
Total Protein: 6.6 g/dL (ref 6.5–8.1)

## 2020-12-21 LAB — CBC
HCT: 26.9 % — ABNORMAL LOW (ref 36.0–46.0)
Hemoglobin: 8.4 g/dL — ABNORMAL LOW (ref 12.0–15.0)
MCH: 31.3 pg (ref 26.0–34.0)
MCHC: 31.2 g/dL (ref 30.0–36.0)
MCV: 100.4 fL — ABNORMAL HIGH (ref 80.0–100.0)
Platelets: 188 10*3/uL (ref 150–400)
RBC: 2.68 MIL/uL — ABNORMAL LOW (ref 3.87–5.11)
RDW: 31.8 % — ABNORMAL HIGH (ref 11.5–15.5)
WBC: 9.5 10*3/uL (ref 4.0–10.5)
nRBC: 3.5 % — ABNORMAL HIGH (ref 0.0–0.2)

## 2020-12-21 LAB — PROTEIN, CSF: Total  Protein, CSF: 57 mg/dL — ABNORMAL HIGH (ref 15–45)

## 2020-12-21 LAB — GLUCOSE, CAPILLARY: Glucose-Capillary: 110 mg/dL — ABNORMAL HIGH (ref 70–99)

## 2020-12-21 LAB — GLUCOSE, CSF: Glucose, CSF: 78 mg/dL — ABNORMAL HIGH (ref 40–70)

## 2020-12-21 MED ORDER — LIDOCAINE HCL 1 % IJ SOLN
INTRAMUSCULAR | Status: AC
  Administered 2020-12-21: 20 mL via INTRADERMAL
  Filled 2020-12-21: qty 20

## 2020-12-21 MED ORDER — POTASSIUM CHLORIDE 20 MEQ PO PACK
40.0000 meq | PACK | Freq: Every day | ORAL | Status: DC
  Administered 2020-12-21 – 2020-12-22 (×2): 40 meq via ORAL
  Filled 2020-12-21 (×2): qty 2

## 2020-12-21 MED ORDER — LIDOCAINE HCL 1 % IJ SOLN
INTRAMUSCULAR | Status: AC
  Filled 2020-12-21: qty 20

## 2020-12-21 MED ORDER — POTASSIUM CHLORIDE CRYS ER 20 MEQ PO TBCR: 40.0000 meq | EXTENDED_RELEASE_TABLET | Freq: Once | ORAL | Status: DC

## 2020-12-21 MED ORDER — FAMOTIDINE IN NACL 20-0.9 MG/50ML-% IV SOLN
20.0000 mg | Freq: Once | INTRAVENOUS | Status: AC
  Administered 2020-12-21: 20 mg via INTRAVENOUS
  Filled 2020-12-21: qty 50

## 2020-12-21 MED ORDER — DIPHENHYDRAMINE HCL 25 MG PO CAPS: 25.0000 mg | ORAL_CAPSULE | Freq: Four times a day (QID) | ORAL | Status: DC | PRN

## 2020-12-21 MED ORDER — SODIUM CHLORIDE 0.9 % IV SOLN: INTRAVENOUS | Status: DC | PRN

## 2020-12-21 MED ORDER — IMMUNE GLOBULIN (HUMAN) 20 GM/200ML IV SOLN
400.0000 mg/kg | INTRAVENOUS | Status: AC
Start: 2020-12-21 — End: 2020-12-26
  Administered 2020-12-21 – 2020-12-25 (×5): 80 g via INTRAVENOUS
  Filled 2020-12-21 (×5): qty 800

## 2020-12-21 MED ORDER — POLYETHYLENE GLYCOL 3350 17 G PO PACK
17.0000 g | PACK | Freq: Every day | ORAL | Status: DC
  Administered 2020-12-21 – 2021-01-04 (×6): 17 g via ORAL
  Filled 2020-12-21 (×18): qty 1

## 2020-12-21 MED ORDER — LIDOCAINE HCL (PF) 1 % IJ SOLN
20.0000 mL | Freq: Once | INTRAMUSCULAR | Status: DC
  Filled 2020-12-21: qty 20

## 2020-12-21 NOTE — Progress Notes (Signed)
Subjective: Just finished LP  Exam: Vitals:   12/21/20 0427 12/21/20 1248  BP: 117/76 112/71  Pulse: (!) 103 (!) 104  Resp: 20   Temp: 98.4 F (36.9 C) 98.2 F (36.8 C)  SpO2: 96% 94%   Gen: In bed, NAD Resp: non-labored breathing, no acute distress Abd: soft, nt  Neuro: MS: awake, alert, interactive and appriate CN: EOMI, VFF Motor: continues to have LE > UE weakness Sensory:diminished to elbow in arms, diminished throughout legs.  ITJ:LLVDIX  Pertinent Labs: CSF WBC 1 CSF RBC 500 CSF Protein 57 CSF Glucose 78  Impression: 30 year old female with diffuse hypoesthesia, areflexia, ataxia some distal weakness most consistent with an acute neuropathy.   Certainly, B12 deficiency and folate deficiency can cause neuropathy, though B12 much more commonly than folate deficiency.  Her current presentation, there is much more acute than these typically present. 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  Recommendations: 1) IVIG 2 g/kg x 5 days 2) nif/FVC 3) neurology to follow  Ritta Slot, MD [facility] [phone]  If 7pm- 7am, please page neurology on call as listed in AMION.

## 2020-12-21 NOTE — Progress Notes (Signed)
   12/21/20 2233  Assess: MEWS Score  Temp 98.8 F (37.1 C)  BP 130/82  Pulse Rate (!) 103  Resp (!) 26  SpO2 100 %  O2 Device Room Air  Assess: MEWS Score  MEWS Temp 0  MEWS Systolic 0  MEWS Pulse 1  MEWS RR 2  MEWS LOC 0  MEWS Score 3  MEWS Score Color Yellow  Assess: if the MEWS score is Yellow or Red  Were vital signs taken at a resting state? Yes  Focused Assessment Change from prior assessment (see assessment flowsheet)  Does the patient meet 2 or more of the SIRS criteria? Yes  Does the patient have a confirmed or suspected source of infection? Yes  Provider and Rapid Response Notified? Yes  MEWS guidelines implemented *See Row Information* Yes  Treat  MEWS Interventions Administered prn meds/treatments;Escalated (See documentation below)  Notify: Charge Nurse/RN  Name of Charge Nurse/RN Notified Brayton Caves, RN  Date Charge Nurse/RN Notified 12/21/20  Time Charge Nurse/RN Notified 2230  Notify: Provider  Provider Name/Title Chinita Greenland, NP  Date Provider Notified 12/21/20  Time Provider Notified 2233  Notification Type  (secure chat)  Notification Reason Change in status  Provider response At bedside;See new orders  Date of Provider Response 12/21/20  Time of Provider Response 2240  Notify: Rapid Response  Name of Rapid Response RN Notified Geronimo Running, RN  Date Rapid Response Notified 12/21/20  Time Rapid Response Notified 2227  Document  Patient Outcome Stabilized after interventions  Progress note created (see row info) Yes  Assess: SIRS CRITERIA  SIRS Temperature  0  SIRS Pulse 1  SIRS Respirations  1  SIRS WBC 0  SIRS Score Sum  2   Patient was receiving IVIG through IV. After the fourth increase in dose patient began experiencing shortness of breath with increased respirations, chills, hoarseness with her throat feeling tight and tremors. IVIG was immediately stopped. Neurology on call was paged, I have not received a call back from them yet. Charge  nurse Brayton Caves RN was notified, Geronimo Running, RN rapid response was notified, and Chinita Greenland, NP notified as well. Oral Benadryl 25 mg was given and pepcid IV was ordered. Will implement yellow MEWS guidelines and continue to monitor patient.

## 2020-12-21 NOTE — Progress Notes (Signed)
    OVERNIGHT PROGRESS REPORT  Notified by Rapid Response RN for cal to floor from assigned RN for possible allergic reaction during IVIG infusion (Last stage increase) of the infusion.   Assessment:Possible reaction to IVIG infusion.    Patient was in last third of infusion when she became pale, diaphoretic, tachypnea, hoarse, and short of breath.      Voice was hoarse, lung sounds diminished with bilateral wheezing Oxygen was increased to 5 LPM via [place] .  Oral benadryl given prior to arrival of provider.    Benadryl was given.  Famotidine ordered and in process. Patient condition rapidly improved with discontinuation of IVIG and intake of prior mentioned medications.  At this time patient is awake and oriented x 4 No sweating, No additional tachycardia (patient is occasionally historically tachy) Vitals otherwise stable. Pallor is resolved, voice has returned, O2 requirement has returned to baseline.  There is no tingling in the lips, face, mouth, or throat, tongue areas.    No sweating, itching, palpitations noted at this time.   Patient states she feels better at this time. Staff will monitor closely for the immediate time period.  No further currently.    Chinita Greenland MSNA MSN ACNPC-AG Acute Care Nurse Practitioner [facility]

## 2020-12-21 NOTE — Procedures (Signed)
DIAGNOSTIC LUMBAR PUNCTURE UNDER FLUOROSCOPIC GUIDANCE COMPARISON: [CT 12/09/2020] FLUOROSCOPY TIME: Fluoroscopy Time:  [2 minutes 42 seconds] Radiation Exposure Index (if provided by the fluoroscopic device):  [114 mGy] Number of Acquired Spot Images: [2] PROCEDURE: Informed consent was obtained from the patient prior to the procedure, including potential complications of headache, allergy, and pain.  With the patient prone, the lower back was prepped with Betadine.  1% Lidocaine was used for local anesthesia.  Lumbar puncture was performed at the [L5-S1] level using a [6 inch 20 ] gauge needle with return of [clear] CSF with an estimated opening pressure of [20] cm water.  [2.5] ml of CSF was collected in 2 tubesfor laboratory studies.  The patient tolerated the procedure well and there were no apparent complications.   Less than desirable volume of CSF collected.  Difficult procedure due to patient body habitus.  The length of subcutaneous tissue acquired to traverse was greater than length of the needle.  Difficult to maintain needle in appropriate position to  collects CSF due to the rebounding compliant tissue.    IMPRESSION: [Successful lumbar puncture with only 2.5 mL cc collected.]

## 2020-12-21 NOTE — Progress Notes (Signed)
PT Cancellation Note  Patient Details Name: Carla Little MRN: [id number] DOB: [birth year]   Cancelled Treatment:    Reason Eval/Treat Not Completed: Patient at procedure or test/unavailable   Rada Hay 12/21/2020, 11:56 AM Blanchard Kelch PT Acute Rehabilitation Services Pager [phone] Office [phone]

## 2020-12-21 NOTE — Significant Event (Signed)
Rapid Response Event Note   Reason for Call :  Possible reaction to IVIG infusion. Patient was in last third of infusion when she became pale, diaphoretic, tachypnea, hoarse, and short of breath.   Initial Focused Assessment:  Voice strained, hoarse, lung sounds diminished with wheezes bilaterally. All of this is reportedly new from previous assessment per Almira Bar, LPN bedside nurse. Oral benadryl given and oxygen placed via nasal cannula for precaution by unit charge nurse. Vitals stable.  Interventions:  Assessment completed. Blood glucose point of care test done and was within normal limits. Bedside RN paged on call hospitalist and neurologist as neurology service had prescribed IVIG. Hospitalist Provider ordered IV Famotidine (pepcid) and to not complete IVIG tonight. He further reviewed scheduled meds on MAR due to be given and advised to give after Famotidine administered. Reassessed thirty minutes after Benadryl given, lung sounds with improved air flow, no wheezing, and voice stronger. Patient denies ongoing shortness of breath or new pain.   Plan of Care:  Reassess vitals per MEWS guidelines and prn. Monitor breathing and wean off oxygen if saturations normal and no shortness of breath. If any changes in condition or reoccurrence of symptoms, call rapid nurse and provider. Will hold steroids for now per provider. No further IVIG this shift and attending providers will reassess this intervention tomorrow. Patient verbalized confidence with plan and will inform staff of needs.   Event Summary:   MD Notified: 2230 Chinita Greenland, NP Call Time: 2228 Arrival Time: 2235 End Time: 2330  Lamona Curl, RN

## 2020-12-21 NOTE — Progress Notes (Signed)
PROGRESS NOTE    Carla Little  WNI:[id number] DOB: Sep 30, 1990 DOA: 12/09/2020 PCP: Patient, No Pcp Per (Inactive)    Chief Complaint  Patient presents with   Emesis   Nausea    Brief Narrative:  Patient is a 30 year old female with morbid obesity, nonalcoholic fatty liver disease, GERD, depression, recently was hospitalized following a mechanical fall with a left knee dislocation, resultant MCL strain, ACL and PCL rupture then complicated by nausea vomiting, tachycardia, UTI and fecal impaction during the hospitalization. Patient had an endoscopy which noted small antral ulcer on 8/27, RUQ ultrasound was unremarkable.  Patient presented to ED with multiple episodes of nausea, vomiting, aches and pains everywhere. In ED, afebrile, mildly tachycardic, hypertensive.   Subjective:  Denies pain, continue to be weak, no n/v  Assessment & Plan:   Principal Problem:   Intractable vomiting with nausea Active Problems:   Left knee dislocation   PUD (peptic ulcer disease)   Nonalcoholic steatohepatitis (NASH)   Class 3 obesity   Sinus tachycardia   Hypokalemia   GERD (gastroesophageal reflux disease)   Iron deficiency anemia due to chronic blood loss   E. coli UTI  Intractable nausea vomiting --Seen by GI during recent hospitalization and this hospitalization, s/p EGD ( on 8/29), HIDA scan on 9/23, CT angio abdomen pelvis on 9/16 -Thought possibly due to gastroparesis, esophagitis, opioid use -Was on IV Ancef Flagyl, Reglan, currently on nystatin/lidocaine Magic mouthwash, iv dilfucan x7 days started by previous attending  -GI recommend minimize narcotic use, gi signed off on 9/26 -appear improving  Paresthesias/weakness -Status post lumbar puncture suspected GBS -Plan per neurology  Hypokalemia Replace K, check mag  B12 deficiency, folate deficiency -Started on B12 injections IM until patient able to tolerate p.o. pills, continue folic acid   E. coli UTI -Urine  culture showed more than 100,000 colonies of E. coli and 20,000 colonies of Proteus mirabilis -IV Ancef and Flagyl discontinued   Recent left knee dislocation -Followed by orthopedics, appreciate recommendations by Dr. Magnus Ivan  -Recommended continue brace for another 2 to 4 weeks  -PT recommending SNF   Sinus tachycardia -Continue beta-blocker IV   Hypothyroidism -TSH 5.2, free T4 1.3   GERD -Continue PPI   Iron deficiency anemia -History of heavy menorrhagia on her cycle, -H&H stable  History of depression She is seen by psychiatry on 9/26, she is deemed low risk of self harm, she is continued on buspirone 5mg  with dinner   Morbid obesity: Body mass index is 76.38 kg/m.     Skin Assessment:  Reason for Consult: intertriginous dermatitis, irritant contact dermatitis at skin folds: inframammary, subpannicular and bilateral inguinal. Stage 2 to sacrum Seen by wound care on 9/18  I have examined the patient's skin and I agree with the wound assessment as performed by the wound care RN as outlined below:  Pressure Injury 11/19/20 Buttocks Right Stage 2 -  Partial thickness loss of dermis presenting as a shallow open injury with a red, pink wound bed without slough. (Active)  11/19/20 2100  Location: Buttocks  Location Orientation: Right  Staging: Stage 2 -  Partial thickness loss of dermis presenting as a shallow open injury with a red, pink wound bed without slough.  Wound Description (Comments):   Present on Admission:     Unresulted Labs (From admission, onward)     Start     Ordered   12/22/20 0500  CBC with Differential/Platelet  Tomorrow morning,   R  Question:  Specimen collection method  Answer:  Lab=Lab collect   12/21/20 1813   12/22/20 0500  Basic metabolic panel  Tomorrow morning,   R       Question:  Specimen collection method  Answer:  Lab=Lab collect   12/21/20 1813   12/22/20 0500  Magnesium  Tomorrow morning,   R       Question:  Specimen  collection method  Answer:  Lab=Lab collect   12/21/20 1813   12/21/20 0500  Vitamin E  Tomorrow morning,   R       Question:  Specimen collection method  Answer:  IV Team=IV Team collect   12/20/20 2110   12/20/20 2110  Copper, serum  Once,   R       Question:  Specimen collection method  Answer:  IV Team=IV Team collect   12/20/20 2110   12/20/20 2110  Vitamin B6  Once,   R       Question:  Specimen collection method  Answer:  IV Team=IV Team collect   12/20/20 2110   12/14/20 0500  CBC with Differential/Platelet  Tomorrow morning,   R        12/13/20 1713              DVT prophylaxis: SCDs Start: 12/09/20 1937   Code Status: Full Family Communication: Patient Disposition:   Status is: Inpatient   Dispo: The patient is from: Home              Anticipated d/c is to: SNF              Anticipated d/c date is: To be determined, need to finish IVIG                Consultants:  Neurology Psychiatry GI Wound care  Procedures:  Lp by IR on 9/28  Antimicrobials:    Anti-infectives (From admission, onward)    Start     Dose/Rate Route Frequency Ordered Stop   12/17/20 1100  fluconazole (DIFLUCAN) IVPB 100 mg        100 mg 50 mL/hr over 60 Minutes Intravenous Daily 12/17/20 1046 12/24/20 0959   12/13/20 2200  metroNIDAZOLE (FLAGYL) IVPB 500 mg  Status:  Discontinued        500 mg 100 mL/hr over 60 Minutes Intravenous Every 8 hours 12/13/20 2107 12/16/20 1620   12/13/20 1000  ceFAZolin (ANCEF) IVPB 2g/100 mL premix  Status:  Discontinued        2 g 200 mL/hr over 30 Minutes Intravenous Every 8 hours 12/13/20 0927 12/16/20 1721   12/12/20 1000  cefTRIAXone (ROCEPHIN) 2 g in sodium chloride 0.9 % 100 mL IVPB  Status:  Discontinued        2 g 200 mL/hr over 30 Minutes Intravenous Every 24 hours 12/12/20 0901 12/13/20 0928   12/11/20 0845  cefTRIAXone (ROCEPHIN) 1 g in sodium chloride 0.9 % 100 mL IVPB  Status:  Discontinued        1 g 200 mL/hr over 30 Minutes  Intravenous Every 24 hours 12/11/20 0758 12/12/20 0901          Objective: Vitals:   12/21/20 0352 12/21/20 0427 12/21/20 1248 12/21/20 1722  BP:  117/76 112/71 127/81  Pulse:  (!) 103 (!) 104   Resp:  20    Temp:  98.4 F (36.9 C) 98.2 F (36.8 C)   TempSrc:   Oral   SpO2:  96% 94%   Weight: Marland Kitchen)  201.9 kg     Height:        Intake/Output Summary (Last 24 hours) at 12/21/2020 1817 Last data filed at 12/20/2020 2100 Gross per 24 hour  Intake --  Output 300 ml  Net -300 ml   Filed Weights   12/18/20 0424 12/20/20 0433 12/21/20 0352  Weight: (!) 195.3 kg (!) 198.5 kg (!) 201.9 kg    Examination:  General exam: calm, NAD, obese Respiratory system: Clear to auscultation. Respiratory effort normal. Cardiovascular system: Sinus tachycardia. Gastrointestinal system: Abdomen is nondistended, soft and nontender. . Normal bowel sounds heard. Central nervous system: Alert and oriented. No focal neurological deficits. Extremities: Barely able to lift legs against gravity,  Skin: No rashes, lesions or ulcers Psychiatry: Judgement and insight appear normal. Mood & affect appropriate.     Data Reviewed: I have personally reviewed following labs and imaging studies  CBC: Recent Labs  Lab 12/15/20 0520 12/17/20 0528 12/21/20 0602  WBC 6.7 6.5 9.5  HGB 9.1* 9.0* 8.4*  HCT 29.6* 29.5* 26.9*  MCV 98.7 100.3* 100.4*  PLT 300 290 188    Basic Metabolic Panel: Recent Labs  Lab 12/15/20 0520 12/16/20 0446 12/17/20 0528 12/21/20 0602  NA 141 138 143 138  K 3.6 4.2 4.1 3.1*  CL 108 104 108 102  CO2 26 22 24 29   GLUCOSE 101* 96 114* 128*  BUN <5* 5* 5* 9  CREATININE 0.68 0.55 0.52 0.35*  CALCIUM 9.1 8.6* 9.2 9.3    GFR: Estimated Creatinine Clearance: 182.7 mL/min (A) (by C-G formula based on SCr of 0.35 mg/dL (L)).  Liver Function Tests: Recent Labs  Lab 12/15/20 0520 12/16/20 0446 12/17/20 0528 12/21/20 0602  AST 119* 138* 143* 246*  ALT 45* 33 40 85*   ALKPHOS 57 51 59 65  BILITOT 2.1* 2.4* 2.5* 3.7*  PROT 7.5 7.1 7.5 6.6  ALBUMIN 3.2* 3.1* 3.1* 2.9*    CBG: No results for input(s): GLUCAP in the last 168 hours.   Recent Results (from the past 240 hour(s))  CSF culture w Gram Stain     Status: None (Preliminary result)   Collection Time: 12/21/20 12:48 PM   Specimen: PATH Cytology CSF; Cerebrospinal Fluid  Result Value Ref Range Status   Specimen Description CSF  Final   Special Requests NONE  Final   Gram Stain   Final    WBC PRESENT,BOTH PMN AND MONONUCLEAR NO ORGANISMS SEEN Gram Stain Report Called to,Read Back By and Verified With: C.12/23/20, RN AT 1418 ON 09.29.22 BY N.THOMPSON Performed at [facility], [address] [place], [place]    Culture PENDING  Incomplete   Report Status PENDING  Incomplete         Radiology Studies: MR BRAIN WO CONTRAST  Result Date: 12/21/2020 CLINICAL DATA:  Cervical radiculopathy EXAM: MRI HEAD WITHOUT CONTRAST TECHNIQUE: Multiplanar, multiecho pulse sequences of the brain and surrounding structures were obtained without intravenous contrast. COMPARISON:  None. FINDINGS: Brain: No acute infarct, mass effect or extra-axial collection. No acute or chronic hemorrhage. Normal white matter signal, parenchymal volume and CSF spaces. The midline structures are normal. Vascular: Major flow voids are preserved. Skull and upper cervical spine: Normal calvarium and skull base. Visualized upper cervical spine and soft tissues are normal. Sinuses/Orbits:No paranasal sinus fluid levels or advanced mucosal thickening. Under pneumatized sphenoid sinus. No mastoid or middle ear effusion. Normal orbits. IMPRESSION: Normal brain MRI. Electronically Signed   By: 12/23/2020 M.D.   On:  12/21/2020 00:12   MR CERVICAL SPINE WO CONTRAST  Result Date: 12/20/2020 CLINICAL DATA:  Cervical radiculopathy EXAM: MRI CERVICAL SPINE WITHOUT CONTRAST TECHNIQUE: Multiplanar, multisequence MR  imaging of the cervical spine was performed. No intravenous contrast was administered. COMPARISON:  None. FINDINGS: Alignment: Physiologic. Vertebrae: No fracture, evidence of discitis, or bone lesion. Cord: Normal signal and morphology. Posterior Fossa, vertebral arteries, paraspinal tissues: Negative. Disc levels: C1-2: Unremarkable. C2-3: Normal disc space and facet joints. There is no spinal canal stenosis. No neural foraminal stenosis. C3-4: Normal disc space and facet joints. There is no spinal canal stenosis. No neural foraminal stenosis. C4-5: Normal disc space and facet joints. There is no spinal canal stenosis. No neural foraminal stenosis. C5-6: Normal disc space and facet joints. There is no spinal canal stenosis. No neural foraminal stenosis. C6-7: Normal disc space and facet joints. There is no spinal canal stenosis. No neural foraminal stenosis. C7-T1: Normal disc space and facet joints. There is no spinal canal stenosis. No neural foraminal stenosis. IMPRESSION: Normal MRI of the cervical spine. Electronically Signed   By: Deatra Robinson M.D.   On: 12/20/2020 23:49   DG FL GUIDED LUMBAR PUNCTURE  Result Date: 12/21/2020 CLINICAL DATA:  Paresthesias, weakness EXAM: DIAGNOSTIC LUMBAR PUNCTURE UNDER FLUOROSCOPIC GUIDANCE COMPARISON:  CT 12/09/2020 FLUOROSCOPY TIME:  Fluoroscopy Time:  2 minutes 42 seconds Radiation Exposure Index (if provided by the fluoroscopic device): 114 mGy Number of Acquired Spot Images: 2 PROCEDURE: Informed consent was obtained from the patient prior to the procedure, including potential complications of headache, allergy, and pain. With the patient prone, the lower back was prepped with Betadine. 1% Lidocaine was used for local anesthesia. Lumbar puncture was performed at the L5-S1 level using a 6 inch 20 gauge needle with return of clear CSF with an estimated opening pressure of 20 cm water. 2.5 ml of CSF was collected in 2 tubes for laboratory studies. The patient  tolerated the procedure well and there were no apparent complications. Less than desirable volume of CSF collected. Difficult procedure due to patient body habitus. The length of subcutaneous tissue required to traverse was greater than length of the needle. Difficult to maintain needle in appropriate position to collects CSF due to the rebounding compliant tissue. IMPRESSION: Successful lumbar puncture with only 2.5 mL cc collected. Electronically Signed   By: Genevive Bi M.D.   On: 12/21/2020 13:20        Scheduled Meds:  bisacodyl  10 mg Rectal Once   busPIRone  5 mg Oral q1800   ciprofloxacin  2 drop Right Eye Q4H while awake   cyanocobalamin  1,000 mcg Intramuscular Daily   folic acid  1 mg Intravenous Daily   gabapentin  100 mg Oral Q8H   lidocaine (PF)  20 mL Intradermal Once   magic mouthwash w/lidocaine  5 mL Oral TID AC   metoprolol tartrate  10 mg Intravenous Q6H   multivitamin  15 mL Oral Daily   nystatin  5 mL Oral QID   pantoprazole (PROTONIX) IV  40 mg Intravenous Q12H   polyethylene glycol  17 g Oral Daily   potassium chloride  40 mEq Oral Once   prochlorperazine  10 mg Intravenous Q8H   senna-docusate  1 tablet Oral BID   sodium chloride flush  10-40 mL Intracatheter Q12H   Continuous Infusions:  fluconazole (DIFLUCAN) IV 100 mg (12/21/20 0857)   Immune Globulin 10%     ondansetron (ZOFRAN) IV 8 mg (12/15/20 1750)  LOS: 11 days   Time spent: Greater than 50% of this time was spent in counseling, explanation of diagnosis, planning of further management, and coordination of care.   Voice Recognition Reubin Milan dictation system was used to create this note, attempts have been made to correct errors. Please contact the author with questions and/or clarifications.   Albertine Grates, MD PhD FACP [facility]  Available via Epic secure chat 7am-7pm for nonurgent issues Please page for urgent issues To page the attending provider between 7A-7P or the  covering provider during after hours 7P-7A, please log into the web site [email or web address] and access using universal [place] password for that web site. If you do not have the password, please call the hospital operator.    12/21/2020, 6:17 PM

## 2020-12-21 NOTE — Progress Notes (Signed)
OT Cancellation Note  Patient Details Name: Carla Little MRN: [id number] DOB: 1991-01-11   Cancelled Treatment:    Reason Eval/Treat Not Completed: Patient at procedure or test/ unavailable Will check back as schedule allows.  Sharyn Blitz OTR/L, MS Acute Rehabilitation Department Office# [phone] Pager# [phone]  12/21/2020, 8:06 AM

## 2020-12-21 NOTE — Progress Notes (Signed)
Received critical lab result for WBC in CSF, Sujata RN notified.

## 2020-12-21 NOTE — Progress Notes (Signed)
Pt performed NIF and Vital Capacity.  NIF greater than -60 cm H2O each of 3 tries.  VC average of three 3 attempts was 2.9L

## 2020-12-22 LAB — CBC WITH DIFFERENTIAL/PLATELET
Abs Immature Granulocytes: 0.35 10*3/uL — ABNORMAL HIGH (ref 0.00–0.07)
Basophils Absolute: 0 10*3/uL (ref 0.0–0.1)
Basophils Relative: 0 %
Eosinophils Absolute: 0 10*3/uL (ref 0.0–0.5)
Eosinophils Relative: 0 %
HCT: 26.9 % — ABNORMAL LOW (ref 36.0–46.0)
Hemoglobin: 8.4 g/dL — ABNORMAL LOW (ref 12.0–15.0)
Immature Granulocytes: 3 %
Lymphocytes Relative: 28 %
Lymphs Abs: 2.9 10*3/uL (ref 0.7–4.0)
MCH: 32.2 pg (ref 26.0–34.0)
MCHC: 31.2 g/dL (ref 30.0–36.0)
MCV: 103.1 fL — ABNORMAL HIGH (ref 80.0–100.0)
Monocytes Absolute: 1.1 10*3/uL — ABNORMAL HIGH (ref 0.1–1.0)
Monocytes Relative: 10 %
Neutro Abs: 6 10*3/uL (ref 1.7–7.7)
Neutrophils Relative %: 59 %
Platelets: 261 10*3/uL (ref 150–400)
RBC: 2.61 MIL/uL — ABNORMAL LOW (ref 3.87–5.11)
RDW: 32 % — ABNORMAL HIGH (ref 11.5–15.5)
WBC: 10.4 10*3/uL (ref 4.0–10.5)
nRBC: 2.4 % — ABNORMAL HIGH (ref 0.0–0.2)

## 2020-12-22 LAB — BASIC METABOLIC PANEL
Anion gap: 10 (ref 5–15)
BUN: 9 mg/dL (ref 6–20)
CO2: 28 mmol/L (ref 22–32)
Calcium: 8.9 mg/dL (ref 8.9–10.3)
Chloride: 98 mmol/L (ref 98–111)
Creatinine, Ser: 0.37 mg/dL — ABNORMAL LOW (ref 0.44–1.00)
GFR, Estimated: >60 mL/min (ref >60)
Glucose, Bld: 99 mg/dL (ref 70–99)
Potassium: 3.3 mmol/L — ABNORMAL LOW (ref 3.5–5.1)
Sodium: 136 mmol/L (ref 135–145)

## 2020-12-22 LAB — MAGNESIUM: Magnesium: 2 mg/dL (ref 1.7–2.4)

## 2020-12-22 LAB — COPPER, SERUM: Copper: 101 ug/dL (ref 80–158)

## 2020-12-22 MED ORDER — DIPHENHYDRAMINE HCL 50 MG/ML IJ SOLN
25.0000 mg | Freq: Every day | INTRAMUSCULAR | Status: AC
  Administered 2020-12-22 – 2020-12-25 (×4): 25 mg via INTRAVENOUS
  Filled 2020-12-22 (×4): qty 1

## 2020-12-22 MED ORDER — POTASSIUM CHLORIDE 20 MEQ PO PACK: 40.0000 meq | PACK | Freq: Every day | ORAL | Status: DC

## 2020-12-22 MED ORDER — ACETAMINOPHEN 325 MG PO TABS
650.0000 mg | ORAL_TABLET | Freq: Every day | ORAL | Status: AC
  Administered 2020-12-22 – 2020-12-25 (×4): 650 mg via ORAL
  Filled 2020-12-22 (×4): qty 2

## 2020-12-22 NOTE — Progress Notes (Signed)
Notified by pt's nurse that pt developed dyspnea and shaking chills during IVIG infusion. There were no pre-medications ordered for this infusion. Recommend pre-medicating prior to subsequent infusions if pt will continue IVIG.

## 2020-12-22 NOTE — Progress Notes (Signed)
NIF = greater than -40cm H2O VC= 2.8L

## 2020-12-22 NOTE — Progress Notes (Signed)
3 TIMES NIF -55 3 TIMES FVC 2.6

## 2020-12-22 NOTE — Progress Notes (Signed)
Neurology Progress Note  Subjective: Concern for reaction to IVIG overnight with pale appearance, dyspnea, diaphoresis, shaking chills, hoarseness, and tachypnea with infusion that rapidly improved following Benadryl and famotidine administration.  9/28: NIF > -60 cmH2O with VC 2.9L  Exam: Vitals:   12/22/20 0222 12/22/20 0622  BP: 121/68 131/76  Pulse: (!) 109 (!) 108  Resp: 20 18  Temp: 99.7 F (37.6 C) 98.4 F (36.9 C)  SpO2: 97% 97%   Gen: Laying in bed, sleeping on initial assessment, without acute distress.  Resp: non-labored breathing, no respiratory distress on room air Abd: soft to palpation throughout, non-distended  Neuro: Mental Status: Asleep initially, wakes easily to voice.  She is oriented throughout with intact speech. There is no dysarthria or aphasia.  Cranial Nerves: PERRL, EOMI, visual fields are full, face is symmetric resting and with movement, hearing is intact to voice, phonation is normal, tongue protrudes midline.  Motor: Lower extremity weakness more pronounced than upper extremity weakness.  Bilateral upper extremities 4/5 strength with possible effort limitation and complaints of RUE pain with elevation.  Bilateral lower extremities with minimal elevation off of bed with right > left elevation due to LLE pain s/p fall. 3/5 strength bilateral lower extremities. Increased bulk throughout possibly contributing to some lower extremity weakness.  Sensory: Diminished in bilateral upper extremities distal to elbow, diminished but symmetric in bilateral lower extremities.  DTR: Absent Gait: Deferred  Pertinent Labs: CBC    Component Value Date/Time   WBC 10.4 12/22/2020 0442   RBC 2.61 (L) 12/22/2020 0442   HGB 8.4 (L) 12/22/2020 0442   HCT 26.9 (L) 12/22/2020 0442   PLT 261 12/22/2020 0442   MCV 103.1 (H) 12/22/2020 0442   MCH 32.2 12/22/2020 0442   MCHC 31.2 12/22/2020 0442   RDW 32.0 (H) 12/22/2020 0442   LYMPHSABS 2.9 12/22/2020 0442   MONOABS  1.1 (H) 12/22/2020 0442   EOSABS 0.0 12/22/2020 0442   BASOSABS 0.0 12/22/2020 0442  CMP     Component Value Date/Time   NA 136 12/22/2020 0442   K 3.3 (L) 12/22/2020 0442   CL 98 12/22/2020 0442   CO2 28 12/22/2020 0442   GLUCOSE 99 12/22/2020 0442   BUN 9 12/22/2020 0442   CREATININE 0.37 (L) 12/22/2020 0442   CALCIUM 8.9 12/22/2020 0442   PROT 6.6 12/21/2020 0602   ALBUMIN 2.9 (L) 12/21/2020 0602   AST 246 (H) 12/21/2020 0602   ALT 85 (H) 12/21/2020 0602   ALKPHOS 65 12/21/2020 0602   BILITOT 3.7 (H) 12/21/2020 0602   GFRNONAA >60 12/22/2020 0442   GFRAA >60 05/16/2018 1519   CSF WBC 1 CSF RBC 500 CSF Protein 57 CSF Glucose 78  Gram Stain WBC PRESENT,BOTH PMN AND MONONUCLEAR  NO ORGANISMS SEEN    Culture PENDING    Imaging Reviewed: MRI brain 9/27: Normal brain MRI.  MRI cervical spine 9/27: Normal MRI of the cervical spine.  Assessment: 30 year old female with diffuse hypoesthesia, areflexia, ataxia some distal weakness most consistent with an acute neuropathy. Certainly, B12 deficiency and folate deficiency can cause neuropathy, though B12 much more commonly than folate deficiency. Her current presentation is much more acute than these typically present. Lumbar puncture was pursued which does show an albuminocytologic dissociation making this presentation most consistent with Guillain-Barr syndrome - Patient with adverse reaction overnight during IVIG administration with pale appearance, tachypnea, diaphoresis, dyspnea, and hoarseness that resolved with benadryl and famotidine administration. Discussed with patient this morning options moving forward and patient  wishes to proceed with premedication prior to IVIG to improve tolerance. If she is unable to tolerate IVIG with premedications and reduced rate of administration, will consider transfer to [facility] for PLEX therapy.   Recommendations: - Will premedicate with 25 mg IV Benadryl and 650 mg PO acetaminophen  prior to IVIG  - Continue IVIG, day 2/5 today, to be complete 10/2 - May also slow the infusion rate to improve tolerance - Discussed with patient who agrees with pretreatment medications for IVIG tolerance  - Continue NIF/FVC - Neurology will continue to follow  Lanae Boast, AGACNP-BC [facility] [phone]   Have seen the patient reviewed the above note.  We will try IVIG with premedication to see if the patient is able to tolerate it.  If not, she may need to be transferred to [facility] for plasma exchange.  Ritta Slot, MD [facility] [phone]  If 7pm- 7am, please page neurology on call as listed in AMION.

## 2020-12-22 NOTE — Progress Notes (Signed)
PROGRESS NOTE    TAMERA WERTH  ZOX:[id number] DOB: November 29, 1990 DOA: 12/09/2020 PCP: Patient, No Pcp Per (Inactive)    Chief Complaint  Patient presents with   Emesis   Nausea    Brief Narrative:  Patient is a 30 year old female with morbid obesity, nonalcoholic fatty liver disease, GERD, depression, recently was hospitalized following a mechanical fall with a left knee dislocation, resultant MCL strain, ACL and PCL rupture then complicated by nausea vomiting, tachycardia, UTI and fecal impaction during the hospitalization. Patient had an endoscopy which noted small antral ulcer on 8/27, RUQ ultrasound was unremarkable.  Patient presented to ED with multiple episodes of nausea, vomiting, aches and pains everywhere. In ED, afebrile, mildly tachycardic, hypertensive.   Subjective:  Reports developed reaction to IVIG infusion, symptom resolved after benadryl given No new complaints this am, remain weak, denies pain, denies bowel and bladder incontinence  Denies pain, continue to be weak, no n/v  Assessment & Plan:   Principal Problem:   Intractable vomiting with nausea Active Problems:   Left knee dislocation   PUD (peptic ulcer disease)   Nonalcoholic steatohepatitis (NASH)   Class 3 obesity   Sinus tachycardia   Hypokalemia   GERD (gastroesophageal reflux disease)   Iron deficiency anemia due to chronic blood loss   E. coli UTI  Intractable nausea vomiting --Seen by GI during recent hospitalization and this hospitalization, s/p EGD ( on 8/29), HIDA scan on 9/23, CT angio abdomen pelvis on 9/16 -Thought possibly due to gastroparesis, esophagitis, opioid use -Was on IV Ancef Flagyl, Reglan, currently on nystatin/lidocaine Magic mouthwash, iv dilfucan x7 days started by previous attending  -GI recommend minimize narcotic use, gi signed off on 9/26 -appear improving  Paresthesias/weakness -Status post lumbar puncture suspected GBS -Plan per  neurology  Hypokalemia Remain low, continue to Replace K, check mag  B12 deficiency, folate deficiency -Started on B12 injections IM until patient able to tolerate p.o. pills, continue folic acid   E. coli UTI -Urine culture showed more than 100,000 colonies of E. coli and 20,000 colonies of Proteus mirabilis -IV Ancef and Flagyl discontinued   Recent left knee dislocation -Followed by orthopedics, appreciate recommendations by Dr. Magnus Ivan  -Recommended continue brace for another 2 to 4 weeks  -PT recommending SNF   Sinus tachycardia -Continue beta-blocker IV   Hypothyroidism -TSH 5.2, free T4 1.3   GERD -Continue PPI   Iron deficiency anemia -History of heavy menorrhagia on her cycle, -H&H stable  History of depression She is seen by psychiatry on 9/26, she is deemed low risk of self harm, she is continued on buspirone 5mg  with dinner   Morbid obesity: Body mass index is 75.53 kg/m.Marland Kitchen     Skin Assessment:  Reason for Consult: intertriginous dermatitis, irritant contact dermatitis at skin folds: inframammary, subpannicular and bilateral inguinal. Stage 2 to sacrum Seen by wound care on 9/18  I have examined the patient's skin and I agree with the wound assessment as performed by the wound care RN as outlined below:  Pressure Injury 11/19/20 Buttocks Right Stage 2 -  Partial thickness loss of dermis presenting as a shallow open injury with a red, pink wound bed without slough. (Active)  11/19/20 2100  Location: Buttocks  Location Orientation: Right  Staging: Stage 2 -  Partial thickness loss of dermis presenting as a shallow open injury with a red, pink wound bed without slough.  Wound Description (Comments):   Present on Admission:     Unresulted Labs (From admission,  onward)     Start     Ordered   12/23/20 0500  Basic metabolic panel  Daily,   R     Question:  Specimen collection method  Answer:  Lab=Lab collect   12/22/20 1058   12/23/20 0500   Magnesium  Tomorrow morning,   R       Question:  Specimen collection method  Answer:  Lab=Lab collect   12/22/20 1058   12/21/20 0500  Vitamin E  Tomorrow morning,   R       Question:  Specimen collection method  Answer:  IV Team=IV Team collect   12/20/20 2110   12/20/20 2110  Copper, serum  Once,   R       Question:  Specimen collection method  Answer:  IV Team=IV Team collect   12/20/20 2110   12/20/20 2110  Vitamin B6  Once,   R       Question:  Specimen collection method  Answer:  IV Team=IV Team collect   12/20/20 2110   12/14/20 0500  CBC with Differential/Platelet  Tomorrow morning,   R        12/13/20 1713              DVT prophylaxis: SCDs Start: 12/09/20 1937   Code Status: Full Family Communication: Patient Disposition:   Status is: Inpatient   Dispo: The patient is from: Home              Anticipated d/c is to: SNF              Anticipated d/c date is: To be determined, need to finish IVIG                Consultants:  Neurology Psychiatry GI Wound care  Procedures:  Lp by IR on 9/28  Antimicrobials:    Anti-infectives (From admission, onward)    Start     Dose/Rate Route Frequency Ordered Stop   12/17/20 1100  fluconazole (DIFLUCAN) IVPB 100 mg        100 mg 50 mL/hr over 60 Minutes Intravenous Daily 12/17/20 1046 12/24/20 0959   12/13/20 2200  metroNIDAZOLE (FLAGYL) IVPB 500 mg  Status:  Discontinued        500 mg 100 mL/hr over 60 Minutes Intravenous Every 8 hours 12/13/20 2107 12/16/20 1620   12/13/20 1000  ceFAZolin (ANCEF) IVPB 2g/100 mL premix  Status:  Discontinued        2 g 200 mL/hr over 30 Minutes Intravenous Every 8 hours 12/13/20 0927 12/16/20 1721   12/12/20 1000  cefTRIAXone (ROCEPHIN) 2 g in sodium chloride 0.9 % 100 mL IVPB  Status:  Discontinued        2 g 200 mL/hr over 30 Minutes Intravenous Every 24 hours 12/12/20 0901 12/13/20 0928   12/11/20 0845  cefTRIAXone (ROCEPHIN) 1 g in sodium chloride 0.9 % 100 mL IVPB   Status:  Discontinued        1 g 200 mL/hr over 30 Minutes Intravenous Every 24 hours 12/11/20 0758 12/12/20 0901          Objective: Vitals:   12/22/20 0023 12/22/20 0222 12/22/20 0427 12/22/20 0622  BP: 121/73 121/68  131/76  Pulse: (!) 117 (!) 109  (!) 108  Resp: (!) 22 20  18   Temp: 98.6 F (37 C) 99.7 F (37.6 C)  98.4 F (36.9 C)  TempSrc:      SpO2: 97% 97%  97%  Weight:   )  199.6 kg   Height:        Intake/Output Summary (Last 24 hours) at 12/22/2020 1059 Last data filed at 12/22/2020 0300 Gross per 24 hour  Intake 931.28 ml  Output --  Net 931.28 ml   Filed Weights   12/20/20 0433 12/21/20 0352 12/22/20 0427  Weight: (!) 198.5 kg (!) 201.9 kg (!) 199.6 kg    Examination:  General exam: calm, NAD, obese Respiratory system: Clear to auscultation. Respiratory effort normal. Cardiovascular system: Sinus tachycardia. Gastrointestinal system: Abdomen is nondistended, soft and nontender. . Normal bowel sounds heard. Central nervous system: Alert and oriented. No focal neurological deficits. Extremities: Barely able to lift legs against gravity,  Skin: No rashes, lesions or ulcers Psychiatry: Judgement and insight appear normal. Mood & affect appropriate.     Data Reviewed: I have personally reviewed following labs and imaging studies  CBC: Recent Labs  Lab 12/17/20 0528 12/21/20 0602 12/22/20 0442  WBC 6.5 9.5 10.4  NEUTROABS  --   --  6.0  HGB 9.0* 8.4* 8.4*  HCT 29.5* 26.9* 26.9*  MCV 100.3* 100.4* 103.1*  PLT 290 188 261    Basic Metabolic Panel: Recent Labs  Lab 12/16/20 0446 12/17/20 0528 12/21/20 0602 12/22/20 0442  NA 138 143 138 136  K 4.2 4.1 3.1* 3.3*  CL 104 108 102 98  CO2 22 24 29 28   GLUCOSE 96 114* 128* 99  BUN 5* 5* 9 9  CREATININE 0.55 0.52 0.35* 0.37*  CALCIUM 8.6* 9.2 9.3 8.9  MG  --   --   --  2.0    GFR: Estimated Creatinine Clearance: 181.3 mL/min (A) (by C-G formula based on SCr of 0.37 mg/dL (L)).  Liver  Function Tests: Recent Labs  Lab 12/16/20 0446 12/17/20 0528 12/21/20 0602  AST 138* 143* 246*  ALT 33 40 85*  ALKPHOS 51 59 65  BILITOT 2.4* 2.5* 3.7*  PROT 7.1 7.5 6.6  ALBUMIN 3.1* 3.1* 2.9*    CBG: Recent Labs  Lab 12/21/20 2238  GLUCAP 110*     Recent Results (from the past 240 hour(s))  CSF culture w Gram Stain     Status: None (Preliminary result)   Collection Time: 12/21/20 12:48 PM   Specimen: PATH Cytology CSF; Cerebrospinal Fluid  Result Value Ref Range Status   Specimen Description   Final    CSF Performed at [facility], [address] [place], [place]    Special Requests   Final    NONE Performed at [facility], [address], [place], [place]    Gram Stain   Final    WBC PRESENT,BOTH PMN AND MONONUCLEAR NO ORGANISMS SEEN Gram Stain Report Called to,Read Back By and Verified With: [id number], RN AT 1418 ON 09.29.22 BY N.THOMPSON Performed at [facility], [address] [place], [place]    Culture   Final    NO GROWTH < 24 HOURS Performed at [facility], [address] [place], [place]    Report Status PENDING  Incomplete         Radiology Studies: MR BRAIN WO CONTRAST  Result Date: 12/21/2020 CLINICAL DATA:  Cervical radiculopathy EXAM: MRI HEAD WITHOUT CONTRAST TECHNIQUE: Multiplanar, multiecho pulse sequences of the brain and surrounding structures were obtained without intravenous contrast. COMPARISON:  None. FINDINGS: Brain: No acute infarct, mass effect or extra-axial collection. No acute or chronic hemorrhage. Normal white matter signal, parenchymal volume  and CSF spaces. The midline structures are normal. Vascular: Major flow voids are preserved. Skull and upper cervical spine: Normal calvarium and skull base. Visualized upper cervical spine and soft tissues are normal. Sinuses/Orbits:No paranasal sinus fluid levels or advanced mucosal  thickening. Under pneumatized sphenoid sinus. No mastoid or middle ear effusion. Normal orbits. IMPRESSION: Normal brain MRI. Electronically Signed   By: Deatra Robinson M.D.   On: 12/21/2020 00:12   MR CERVICAL SPINE WO CONTRAST  Result Date: 12/20/2020 CLINICAL DATA:  Cervical radiculopathy EXAM: MRI CERVICAL SPINE WITHOUT CONTRAST TECHNIQUE: Multiplanar, multisequence MR imaging of the cervical spine was performed. No intravenous contrast was administered. COMPARISON:  None. FINDINGS: Alignment: Physiologic. Vertebrae: No fracture, evidence of discitis, or bone lesion. Cord: Normal signal and morphology. Posterior Fossa, vertebral arteries, paraspinal tissues: Negative. Disc levels: C1-2: Unremarkable. C2-3: Normal disc space and facet joints. There is no spinal canal stenosis. No neural foraminal stenosis. C3-4: Normal disc space and facet joints. There is no spinal canal stenosis. No neural foraminal stenosis. C4-5: Normal disc space and facet joints. There is no spinal canal stenosis. No neural foraminal stenosis. C5-6: Normal disc space and facet joints. There is no spinal canal stenosis. No neural foraminal stenosis. C6-7: Normal disc space and facet joints. There is no spinal canal stenosis. No neural foraminal stenosis. C7-T1: Normal disc space and facet joints. There is no spinal canal stenosis. No neural foraminal stenosis. IMPRESSION: Normal MRI of the cervical spine. Electronically Signed   By: Deatra Robinson M.D.   On: 12/20/2020 23:49   DG FL GUIDED LUMBAR PUNCTURE  Result Date: 12/21/2020 CLINICAL DATA:  Paresthesias, weakness EXAM: DIAGNOSTIC LUMBAR PUNCTURE UNDER FLUOROSCOPIC GUIDANCE COMPARISON:  CT 12/09/2020 FLUOROSCOPY TIME:  Fluoroscopy Time:  2 minutes 42 seconds Radiation Exposure Index (if provided by the fluoroscopic device): 114 mGy Number of Acquired Spot Images: 2 PROCEDURE: Informed consent was obtained from the patient prior to the procedure, including potential complications of  headache, allergy, and pain. With the patient prone, the lower back was prepped with Betadine. 1% Lidocaine was used for local anesthesia. Lumbar puncture was performed at the L5-S1 level using a 6 inch 20 gauge needle with return of clear CSF with an estimated opening pressure of 20 cm water. 2.5 ml of CSF was collected in 2 tubes for laboratory studies. The patient tolerated the procedure well and there were no apparent complications. Less than desirable volume of CSF collected. Difficult procedure due to patient body habitus. The length of subcutaneous tissue required to traverse was greater than length of the needle. Difficult to maintain needle in appropriate position to collects CSF due to the rebounding compliant tissue. IMPRESSION: Successful lumbar puncture with only 2.5 mL cc collected. Electronically Signed   By: Genevive Bi M.D.   On: 12/21/2020 13:20        Scheduled Meds:  acetaminophen  650 mg Oral Daily   bisacodyl  10 mg Rectal Once   busPIRone  5 mg Oral q1800   ciprofloxacin  2 drop Right Eye Q4H while awake   cyanocobalamin  1,000 mcg Intramuscular Daily   diphenhydrAMINE  25 mg Intravenous Daily   folic acid  1 mg Intravenous Daily   gabapentin  100 mg Oral Q8H   lidocaine (PF)  20 mL Intradermal Once   magic mouthwash w/lidocaine  5 mL Oral TID AC   metoprolol tartrate  10 mg Intravenous Q6H   multivitamin  15 mL Oral Daily   nystatin  5 mL Oral  QID   pantoprazole (PROTONIX) IV  40 mg Intravenous Q12H   polyethylene glycol  17 g Oral Daily   [START ON 12/23/2020] potassium chloride  40 mEq Oral Daily   prochlorperazine  10 mg Intravenous Q8H   senna-docusate  1 tablet Oral BID   sodium chloride flush  10-40 mL Intracatheter Q12H   Continuous Infusions:  sodium chloride     fluconazole (DIFLUCAN) IV 100 mg (12/22/20 1029)   Immune Globulin 10% Stopped (12/21/20 2230)   ondansetron (ZOFRAN) IV 8 mg (12/15/20 1750)     LOS: 12 days   Time spent:  Greater than 50% of this time was spent in counseling, explanation of diagnosis, planning of further management, and coordination of care.   Voice Recognition Re[employer] [place] dictation system was used to create this note, attempts have been made to correct errors. Please contact the author with questions and/or clarifications.   Albertine Grates, MD PhD FACP [facility]  Available via Epic secure chat 7am-7pm for nonurgent issues Please page for urgent issues To page the attending provider between 7A-7P or the covering provider during after hours 7P-7A, please log into the web site [email or web address] and access using universal [place] password for that web site. If you do not have the password, please call the hospital operator.    12/22/2020, 10:59 AM

## 2020-12-22 NOTE — Consult Note (Signed)
[facility] Psychiatry New Psychiatric Evaluation   Service Date: December 22, 2020 LOS:  LOS: 12 days    Assessment  Carla Little is a 30 y.o. female admitted medically for 12/09/2020  3:04 PM for nausea, vomiting and a recent hospitalization for mechanical fall. She carries the psychiatric diagnoses of depression and anxiety and has a past medical history of  NAFLD, GERD, depression.Psychiatry was consulted for psychiatric contribution to presenting symptoms (parasthesias, weakness) by Dr. Isidoro Donning.    Her current presentation of several years of low mood, anhedonia, difficulty with self care is most consistent with social isolation and depression. She is at high risk for psychosomatic illness (symptoms out of proportion to objective findings, childlike appearance, socially isolated, bulk of human connection with medical system). However this is a diagnosis of exclusion and patient deserves full workup and neurology consultation. Whether or not patient is ultimately diagnosed with conversion disorder, ongoing physical therapy involvement will be essential to her care. She would particularly benefit from outpatient psychiatric followup including therapy; have recommended IOP after discharge from hospital Current outpatient psychotropic medications include nothing.  On initial examination, patient endorsed feelings of anxiety, depression. She is upset that the hospital team feels she isn't trying hard to get better and feels she has only been in the hospital for a few days; validated patient's feelings and emphasized all of the improvements she has made in her life over the last 2 years. She was not interested in starting an antidepressant but did consent to off-label use of buspirone as a promotility agent; will start at very low dose. Please see plan below for detailed recommendations.   9/29: significant updates to medical status; pt largely feeling vindicated and glad there is an  understanding. Minimal effect or side effect from buspirone assents to increasing dose.   Diagnoses:  Active Hospital problems: Principal Problem:   Intractable vomiting with nausea Active Problems:   Left knee dislocation   PUD (peptic ulcer disease)   Nonalcoholic steatohepatitis (NASH)   Class 3 obesity   Sinus tachycardia   Hypokalemia   GERD (gastroesophageal reflux disease)   Iron deficiency anemia due to chronic blood loss   E. coli UTI    Problems edited/added by me: No problems updated.  Plan  ## Safety and Observation Level:  - Based on my clinical evaluation, I estimate the patient to be at low risk of self harm in the current setting - At this time, we recommend a standard level of observation. This decision is based on my review of the chart including patient's history and current presentation, interview of the patient, mental status examination, and consideration of suicide risk including evaluating suicidal ideation, plan, intent, suicidal or self-harm behaviors, risk factors, and protective factors. This judgment is based on our ability to directly address suicide risk, implement suicide prevention strategies and develop a safety plan while the patient is in the clinical setting. Please contact our team if there is a concern that risk level has changed.   ## Medications:  -- inc buspirone 5 mg BID   ## Medical Decision Making Capacity:  Not formally assessed at this time  ## Further Work-up:  -- TSH mildly elevated, free T4 elevated (?illness?) -- folate low -- B1 wnl -- B12 low -- hepatitis panel wnl -- CMP generally with reduced albumin, AST>ALT, increased bilirubin  ## Disposition:  -- no psychiatric contraindication to dc   ##Legal Status -- voluntary   Thank you for this consult request.  Recommendations have been communicated to the primary team.  We will continue to follow at this time.   Carla Little    NEW followup history   Relevant Aspects of Hospital Course:  Admitted on 12/09/2020 for nausea and vomiting. Has developed weakness and parasthesias over the past few days which are out of proportion to medical findings.  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 She has been struggling with her diagnosis and has not gotten out of bed since she got here - trying to do exercises to avoid losing too much muscle mass. Appears highly motivated to work with PT. We discussed her medication - has been generally able to keep ensure down. Difficult to tell if buspirone having much of an effect, but not noting any side effects and is open to increasing it. Voice was less bizarrely high than last eval.  No SI/HI/AH/VH  ROS:  (+) for numbness, weakness, nausea - has been keeping  Back pain from stuck in bed Otherwise full ROS (-)  Collateral information:  None obtained this visit  Psychiatric History:  Information collected from patient  Dx with depression, anxiety in 2020; one lifetime hospitalization. Discharged on lexapro, buspar, trazodone, and hydroxyzine - felt "like a zombie" and gained a lot of weight. Self discontinued these medications.   Generally had good childhood, denies physical or sexual trauma. Was bullied as a child.   Medical History: Past Medical History:  Diagnosis Date  . Class 3 obesity 12/09/2020  . Depression   . GERD (gastroesophageal reflux disease)   . Nonalcoholic steatohepatitis (NASH) 12/09/2020  . Obesity   . PUD (peptic ulcer disease) 12/09/2020    Surgical History: Past Surgical History:  Procedure Laterality Date  . BIOPSY  11/19/2020   Procedure: BIOPSY;  Surgeon: Charna Elizabeth, MD;  Location: [facility];  Service: Endoscopy;;  . ESOPHAGOGASTRODUODENOSCOPY (EGD)  WITH PROPOFOL N/A 11/19/2020   Procedure: ESOPHAGOGASTRODUODENOSCOPY (EGD) WITH PROPOFOL;  Surgeon: Charna Elizabeth, MD;  Location: [facility];  Service: Endoscopy;  Laterality: N/A;    Medications:   Current Facility-Administered Medications:  .  0.9 %  sodium chloride infusion, , Intravenous, PRN, Luiz Iron, NP .  acetaminophen (TYLENOL) tablet 650 mg, 650 mg, Oral, Q6H PRN **OR** acetaminophen (TYLENOL) suppository 650 mg, 650 mg, Rectal, Q6H PRN, Bobette Mo, MD .  acetaminophen (TYLENOL) tablet 650 mg, 650 mg, Oral, Daily, Toberman, Stevi W, NP .  bisacodyl (DULCOLAX) suppository 10 mg, 10 mg, Rectal, Once, Rodolph Bong, MD .  busPIRone (BUSPAR) tablet 5 mg, 5 mg, Oral, q1800, Nazier Neyhart A, 5 mg at 12/21/20 1714 .  ciprofloxacin (CILOXAN) 0.3 % ophthalmic solution 2 drop, 2 drop, Right Eye, Q4H while awake, Bobette Mo, MD, 2 drop at 12/22/20 1321 .  cyanocobalamin ((VITAMIN B-12)) injection 1,000 mcg, 1,000 mcg, Intramuscular, Daily, Rai, Ripudeep K, MD, 1,000 mcg at 12/22/20 1014 .  diphenhydrAMINE (BENADRYL) 12.5 MG/5ML elixir 25 mg, 25 mg, Oral, Q6H PRN, Rai, Ripudeep K, MD, 25 mg at 12/21/20 2230 .  diphenhydrAMINE (BENADRYL) injection 25 mg, 25 mg, Intravenous, Daily, Cindie Laroche, Stevi W, NP .  fluconazole (DIFLUCAN) IVPB 100 mg, 100 mg, Intravenous, Daily, Rai, Ripudeep K, MD, Last Rate: 50 mL/hr at 12/22/20 1029, 100 mg at 12/22/20 1029 .  folic acid injection 1  mg, 1 mg, Intravenous, Daily, Rai, Ripudeep K, MD, 1 mg at 12/22/20 1015 .  gabapentin (NEURONTIN) 250 MG/5ML solution 100 mg, 100 mg, Oral, Q8H, Rai, Ripudeep K, MD, 100 mg at 12/22/20 1014 .  HYDROmorphone (DILAUDID) injection 0.5 mg, 0.5 mg, Intravenous, Q4H PRN, Zannie Cove, MD, 0.5 mg at 12/18/20 0916 .  Immune Globulin 10% (PRIVIGEN) IV infusion 80 g, 400 mg/kg, Intravenous, Q24 Hr x 5, Kirkpatrick, Hardin Negus, MD, Stopped at 12/21/20 2230 .  lidocaine (PF) (XYLOCAINE) 1 %  injection 20 mL, 20 mL, Intradermal, Once, Rejeana Brock, MD .  magic mouthwash w/lidocaine, 5 mL, Oral, TID AC, Rai, Ripudeep K, MD, 5 mL at 12/22/20 1016 .  menthol-cetylpyridinium (CEPACOL) lozenge 3 mg, 1 lozenge, Oral, PRN, Blount, Xenia T, NP, 3 mg at 12/14/20 1855 .  metoprolol tartrate (LOPRESSOR) injection 10 mg, 10 mg, Intravenous, Q6H, Rodolph Bong, MD, 10 mg at 12/22/20 1321 .  multivitamin liquid 15 mL, 15 mL, Oral, Daily, Rejeana Brock, MD, 15 mL at 12/22/20 1016 .  nystatin (MYCOSTATIN) 100000 UNIT/ML suspension 500,000 Units, 5 mL, Oral, QID, Rai, Ripudeep K, MD, 500,000 Units at 12/22/20 1517 .  ondansetron (ZOFRAN) tablet 8 mg, 8 mg, Oral, Q6H PRN, 8 mg at 12/13/20 0648 **OR** ondansetron (ZOFRAN) 8 mg in sodium chloride 0.9 % 50 mL IVPB, 8 mg, Intravenous, Q6H PRN, Bobette Mo, MD, Last Rate: 216 mL/hr at 12/15/20 1750, 8 mg at 12/15/20 1750 .  oxyCODONE (ROXICODONE) 5 MG/5ML solution 5 mg, 5 mg, Oral, Q4H PRN, Rai, Ripudeep K, MD, 5 mg at 12/22/20 1013 .  pantoprazole (PROTONIX) injection 40 mg, 40 mg, Intravenous, Q12H, Bobette Mo, MD, 40 mg at 12/22/20 1013 .  phenol (CHLORASEPTIC) mouth spray 1 spray, 1 spray, Mouth/Throat, PRN, Rai, Ripudeep K, MD .  polyethylene glycol (MIRALAX / GLYCOLAX) packet 17 g, 17 g, Oral, Daily, Albertine Grates, MD, 17 g at 12/22/20 1018 .  [START ON 12/23/2020] potassium chloride (KLOR-CON) packet 40 mEq, 40 mEq, Oral, Daily, Albertine Grates, MD .  prochlorperazine (COMPAZINE) injection 10 mg, 10 mg, Intravenous, Q8H, Rodolph Bong, MD, 10 mg at 12/22/20 1446 .  senna-docusate (Senokot-S) tablet 1 tablet, 1 tablet, Oral, BID, Rodolph Bong, MD, 1 tablet at 12/17/20 2108 .  sodium chloride flush (NS) 0.9 % injection 10-40 mL, 10-40 mL, Intracatheter, Q12H, Rai, Ripudeep K, MD, 10 mL at 12/22/20 1033 .  sodium chloride flush (NS) 0.9 % injection 10-40 mL, 10-40 mL, Intracatheter, PRN, Rai, Ripudeep K,  MD  Allergies: Allergies  Allergen Reactions  . Azithromycin Shortness Of Breath and Nausea And Vomiting    Social History:  Lives alone; family has passed or lives in [place] Intersted in men Has some social support (some friends, has had one visitor in hospital)  Tobacco use: Denies Alcohol use: Denies Drug use: Denies  Family History:  The patient's family history includes Hypertension in an other family member.    Objective  Vital signs:  Temp:  [98.1 F (36.7 C)-99.7 F (37.6 C)] 98.4 F (36.9 C) (09/29 0622) Pulse Rate:  [99-117] 107 (09/29 1206) Resp:  [16-26] 16 (09/29 1206) BP: (98-131)/(68-88) 115/70 (09/29 1206) SpO2:  [91 %-100 %] 97 % (09/29 1206) Weight:  [199.6 kg] 199.6 kg (09/29 0427)  Physical Exam: Gen: Obese, ill-appearing Neuro: minimal spontaneous movement noted, CN II-XII grossly intact Shakes legs (volitional when asked) whenever she becomes anxious Pulm: no increased WOB Skin: No rash/lesion/ulcer present in visible skin  Mental Status Exam: Appearance: Appeared stated age today  Attitude:  Eager to please, cooperative  Behavior/Psychomotor: minimal spontaneous movement  Speech/Language:  Voice has normalized - normal pitch and prosody  Mood: vindicated  Affect: Constricted, intermittently hopeful  Thought process: Linear, goal-directed  Thought content:   Devoid of SI, HI,   Perceptual disturbances:  Denies AH/VH, not overtly RIS   Attention: Attended to conversation without difficulty, not formally assessed   Concentration: No difficulties noted, not formally assessed  Orientation: Oriented x4  Memory: Recent and remote intact   Fund of knowledge:  Not formally assessed  Insight:   fair  Judgment:  fair  Impulse Control: fair    Data Reviewed: Prior notes, imaging

## 2020-12-22 NOTE — Progress Notes (Addendum)
Patient was receiving IVIG through IV. Patient was in last third of IVIG dose when she began to experience shortness of breath with tachypnea, chills, hoarseness with her throat feeling tight, paleness, and tremors. IVIG was immediately stopped. Charge nurse Brayton Caves RN was notified, oral benadryl was given and rapid response was called. Geronimo Running, RN from rapid response came to evaluate patient as well as NP on call Chinita Greenland. New order for IV Pepcid in and started. All medications due at 2200 held until 0100 due to reaction per Chinita Greenland, NP. Neurology on call was paged, I have not received a call back from them yet. Patient is resting comfortably now, will continue to monitor patient and yellow MEWS implemented.

## 2020-12-23 DIAGNOSIS — G610 Guillain-Barre syndrome: Secondary | ICD-10-CM

## 2020-12-23 LAB — BASIC METABOLIC PANEL
Anion gap: 13 (ref 5–15)
Anion gap: 6 (ref 5–15)
BUN: 12 mg/dL (ref 6–20)
BUN: 11 mg/dL (ref 6–20)
CO2: 28 mmol/L (ref 22–32)
CO2: 31 mmol/L (ref 22–32)
Calcium: 9.1 mg/dL (ref 8.9–10.3)
Calcium: 8.8 mg/dL — ABNORMAL LOW (ref 8.9–10.3)
Chloride: 97 mmol/L — ABNORMAL LOW (ref 98–111)
Chloride: 100 mmol/L (ref 98–111)
Creatinine, Ser: 0.48 mg/dL (ref 0.44–1.00)
Creatinine, Ser: 0.38 mg/dL — ABNORMAL LOW (ref 0.44–1.00)
GFR, Estimated: >60 mL/min (ref >60)
GFR, Estimated: >60 mL/min (ref >60)
Glucose, Bld: 91 mg/dL (ref 70–99)
Glucose, Bld: 105 mg/dL — ABNORMAL HIGH (ref 70–99)
Potassium: 5.7 mmol/L — ABNORMAL HIGH (ref 3.5–5.1)
Potassium: 3.8 mmol/L (ref 3.5–5.1)
Sodium: 138 mmol/L (ref 135–145)
Sodium: 137 mmol/L (ref 135–145)

## 2020-12-23 LAB — CBC
HCT: 26.7 % — ABNORMAL LOW (ref 36.0–46.0)
Hemoglobin: 8.3 g/dL — ABNORMAL LOW (ref 12.0–15.0)
MCH: 32.4 pg (ref 26.0–34.0)
MCHC: 31.1 g/dL (ref 30.0–36.0)
MCV: 104.3 fL — ABNORMAL HIGH (ref 80.0–100.0)
Platelets: 250 10*3/uL (ref 150–400)
RBC: 2.56 MIL/uL — ABNORMAL LOW (ref 3.87–5.11)
RDW: 30.7 % — ABNORMAL HIGH (ref 11.5–15.5)
WBC: 7.4 10*3/uL (ref 4.0–10.5)
nRBC: 0.5 % — ABNORMAL HIGH (ref 0.0–0.2)

## 2020-12-23 LAB — MAGNESIUM: Magnesium: 2 mg/dL (ref 1.7–2.4)

## 2020-12-23 LAB — VITAMIN B6: Vitamin B6: 4.1 ug/L (ref 3.4–65.2)

## 2020-12-23 LAB — POTASSIUM: Potassium: 3.8 mmol/L (ref 3.5–5.1)

## 2020-12-23 NOTE — Progress Notes (Signed)
Physical Therapy Treatment Patient Details Name: Carla Little MRN: [id number] DOB: Dec 14, 1990 Today's Date: 12/23/2020   History of Present Illness Patient is a 30 y.o. female who presented to [facility] for intractable N/V on 9/17. ED labwork reveals e. coli UTI. Pt had recent hospital admission from 8/8-9/5 due to Lt knee dislocation which was reduced with fractures of the proximal fibula ligamentous avulsion laterally as well as medially off the medial femoral condyle and MRI showed complete ACL & PCL tears and MCL strain. That hospital admission was complicated by nausea, vomiting, tachycardia, UTI, and fecal impaction;. PMH significant for morbid obesity, GERD, fatty liver disease, depression. Patient now with guillan barre.    PT Comments    Patient with profound weakness of all extremities, speech also slurred.  Noted significant edema RUE  > LUE , less so in legs. Assisted with AAROM/strengthening exercises.  Have requested move to [facility] room for  safer transfers to recliner for sitting activities.   Recommendations for follow up therapy are one component of a multi-disciplinary discharge planning process, led by the attending physician.  Recommendations may be updated based on patient status, additional functional criteria and insurance authorization.  Follow Up Recommendations  SNF     Equipment Recommendations  Wheelchair cushion (measurements PT);Wheelchair (measurements PT)    Recommendations for Other Services       Precautions / Restrictions Precautions Precautions: Fall Precaution Comments: PT Orders from Maureen Ralphs, MD; "WBAT Left LE, only up with a walker, does not need brace at this standpoint." Also in his note says if probalems with mobility - use brace again. Required Braces or Orthoses: Knee Immobilizer - Left Restrictions Weight Bearing Restrictions: Yes LLE Weight Bearing: Weight bearing as tolerated        Balance                                             Cognition Arousal/Alertness: Awake/alert Behavior During Therapy: Flat affect Overall Cognitive Status: Within Functional Limits for tasks assessed                                        Exercises General Exercises - Upper Extremity Shoulder Flexion: AAROM;Supine;Both;10 reps Elbow Flexion: AAROM;10 reps;Both;Supine General Exercises - Lower Extremity Ankle Circles/Pumps: AROM;Both;15 reps;Supine Quad Sets: AROM;Supine;Both;10 reps Heel Slides: AROM;Right;10 reps;AAROM    General Comments        Pertinent Vitals/Pain Faces Pain Scale: Hurts even more Pain Location: BUEs, L > R Pain Descriptors / Indicators: Discomfort;Grimacing;Guarding;Pins and needles;Sore;Spasm Pain Intervention(s): Limited activity within patient's tolerance;Monitored during session    Home Living                      Prior Function            PT Goals (current goals can now be found in the care plan section) Acute Rehab PT Goals Patient Stated Goal: to get better before going home PT Goal Formulation: With patient Time For Goal Achievement: 01/06/21 Potential to Achieve Goals: Fair Progress towards PT goals: Not progressing toward goals - comment (new Dx of GBS , neurological decline)    Frequency    Min 2X/week      PT Plan Frequency needs to be updated;Discharge plan needs to be updated  Co-evaluation       OT goals addressed during session: Proper use of Adaptive equipment and DME;ADL's and self-care      AM-PAC PT "6 Clicks" Mobility   Outcome Measure  Help needed turning from your back to your side while in a flat bed without using bedrails?: Total Help needed moving from lying on your back to sitting on the side of a flat bed without using bedrails?: Total Help needed moving to and from a bed to a chair (including a wheelchair)?: Total Help needed standing up from a chair using your arms (e.g., wheelchair or bedside  chair)?: Total Help needed to walk in hospital room?: Total Help needed climbing 3-5 steps with a railing? : Total 6 Click Score: 6    End of Session   Activity Tolerance: Patient limited by fatigue Patient left: in bed;with call bell/phone within reach Nurse Communication: Mobility status;Need for lift equipment (tenor) PT Visit Diagnosis: Muscle weakness (generalized) (M62.81)     Time: 6063-0160 PT Time Calculation (min) (ACUTE ONLY): 23 min  Charges:  $Therapeutic Exercise: 23-37 mins                     Blanchard Kelch PT Acute Rehabilitation Services Pager [phone] Office [phone]   Rada Hay 12/23/2020, 4:57 PM

## 2020-12-23 NOTE — Progress Notes (Addendum)
PROGRESS NOTE    KARALINA TIFT  JJO:[id number] DOB: 03-17-1991 DOA: 12/09/2020 PCP: Patient, No Pcp Per (Inactive)    Chief Complaint  Patient presents with   Emesis   Nausea    Brief Narrative:  Patient is a 30 year old female with morbid obesity, nonalcoholic fatty liver disease, GERD, depression, recently was hospitalized following a mechanical fall with a left knee dislocation, resultant MCL strain, ACL and PCL rupture then complicated by nausea vomiting, tachycardia, UTI and fecal impaction during the hospitalization. Patient had an endoscopy which noted small antral ulcer on 8/27, RUQ ultrasound was unremarkable.  Patient presented to ED with multiple episodes of nausea, vomiting, aches and pains everywhere. In ED, afebrile, mildly tachycardic, hypertensive.   Subjective:  She did ok with second IVIG infusion with premedication No overnight event,  No new complaints this am, remain weak, denies pain, denies bowel and bladder incontinence  No n/v  Assessment & Plan:   Principal Problem:   Intractable vomiting with nausea Active Problems:   Left knee dislocation   PUD (peptic ulcer disease)   Nonalcoholic steatohepatitis (NASH)   Class 3 obesity   Sinus tachycardia   Hypokalemia   GERD (gastroesophageal reflux disease)   Iron deficiency anemia due to chronic blood loss   E. coli UTI  Intractable nausea vomiting --Seen by GI during recent hospitalization and this hospitalization, s/p EGD ( on 8/29), HIDA scan on 9/23, CT angio abdomen pelvis on 9/16 -Thought possibly due to gastroparesis, esophagitis, opioid use -Was on IV Ancef Flagyl, Reglan, currently on nystatin/lidocaine Magic mouthwash, iv dilfucan x7 days started by previous attending  -GI recommend minimize narcotic use, gi signed off on 9/26 -appear improving  Paresthesias/weakness -Status post lumbar puncture suspected GBS -Plan per neurology  Hypokalemia Improved after supplement, monitor   B12  deficiency, folate deficiency -Started on B12 injections IM until patient able to tolerate p.o. pills, continue folic acid   E. coli UTI -Urine culture showed more than 100,000 colonies of E. coli and 20,000 colonies of Proteus mirabilis -IV Ancef and Flagyl discontinued   Recent left knee dislocation -Followed by orthopedics, appreciate recommendations by Dr. Magnus Ivan  -Recommended continue brace for another 2 to 4 weeks  -PT recommending SNF   Sinus tachycardia -Continue beta-blocker IV   Hypothyroidism -TSH 5.2, free T4 1.3   GERD -Continue PPI   Iron deficiency anemia -History of heavy menorrhagia on her cycle, -H&H stable  History of depression She is seen by psychiatry on 9/26, she is deemed low risk of self harm, she is continued on buspirone 5mg  with dinner   Morbid obesity: Body mass index is 75.53 kg/m.     Skin Assessment:  Reason for Consult: intertriginous dermatitis, irritant contact dermatitis at skin folds: inframammary, subpannicular and bilateral inguinal. Stage 2 to sacrum Seen by wound care on 9/18  I have examined the patient's skin and I agree with the wound assessment as performed by the wound care RN as outlined below:  Pressure Injury 11/19/20 Buttocks Right Stage 2 -  Partial thickness loss of dermis presenting as a shallow open injury with a red, pink wound bed without slough. (Active)  11/19/20 2100  Location: Buttocks  Location Orientation: Right  Staging: Stage 2 -  Partial thickness loss of dermis presenting as a shallow open injury with a red, pink wound bed without slough.  Wound Description (Comments):   Present on Admission:     Unresulted Labs (From admission, onward)     Start  Ordered   12/24/20 0500  CBC  Tomorrow morning,   R       Question:  Specimen collection method  Answer:  Lab=Lab collect   12/23/20 1508   12/23/20 1509  Basic metabolic panel  Every Mon-Wed-Fri,   R (with TIMED occurrences)     Question:   Specimen collection method  Answer:  Lab=Lab collect   12/23/20 1509   12/23/20 1508  CBC  Every Mon-Wed-Fri,   R (with TIMED occurrences)     Question:  Specimen collection method  Answer:  Lab=Lab collect   12/23/20 1508   12/23/20 0500  Basic metabolic panel  Daily,   R     Question:  Specimen collection method  Answer:  Lab=Lab collect   12/22/20 1058   12/21/20 0500  Vitamin E  Tomorrow morning,   R       Question:  Specimen collection method  Answer:  IV Team=IV Team collect   12/20/20 2110   12/14/20 0500  CBC with Differential/Platelet  Tomorrow morning,   R        12/13/20 1713              DVT prophylaxis: SCDs Start: 12/09/20 1937   Code Status: Full Family Communication: Patient Disposition:   Status is: Inpatient   Dispo: The patient is from: Home              Anticipated d/c is to: SNF              Anticipated d/c date is: To be determined, need to finish IVIG                Consultants:  Neurology Psychiatry GI Wound care  Procedures:  Lp by IR on 9/28  Antimicrobials:    Anti-infectives (From admission, onward)    Start     Dose/Rate Route Frequency Ordered Stop   12/17/20 1100  fluconazole (DIFLUCAN) IVPB 100 mg        100 mg 50 mL/hr over 60 Minutes Intravenous Daily 12/17/20 1046 12/23/20 1125   12/13/20 2200  metroNIDAZOLE (FLAGYL) IVPB 500 mg  Status:  Discontinued        500 mg 100 mL/hr over 60 Minutes Intravenous Every 8 hours 12/13/20 2107 12/16/20 1620   12/13/20 1000  ceFAZolin (ANCEF) IVPB 2g/100 mL premix  Status:  Discontinued        2 g 200 mL/hr over 30 Minutes Intravenous Every 8 hours 12/13/20 0927 12/16/20 1721   12/12/20 1000  cefTRIAXone (ROCEPHIN) 2 g in sodium chloride 0.9 % 100 mL IVPB  Status:  Discontinued        2 g 200 mL/hr over 30 Minutes Intravenous Every 24 hours 12/12/20 0901 12/13/20 0928   12/11/20 0845  cefTRIAXone (ROCEPHIN) 1 g in sodium chloride 0.9 % 100 mL IVPB  Status:  Discontinued        1  g 200 mL/hr over 30 Minutes Intravenous Every 24 hours 12/11/20 0758 12/12/20 0901          Objective: Vitals:   12/23/20 0131 12/23/20 0250 12/23/20 1358 12/23/20 1359  BP: 115/64 125/75 110/76   Pulse: 94 92 (!) 110   Resp: 20 18    Temp: 97.8 F (36.6 C) 98.1 F (36.7 C)  99 F (37.2 C)  TempSrc: Oral Oral    SpO2: 96% 96% 98%   Weight:      Height:        Intake/Output Summary (  Last 24 hours) at 12/23/2020 1509 Last data filed at 12/23/2020 0915 Gross per 24 hour  Intake 1152.17 ml  Output --  Net 1152.17 ml   Filed Weights   12/20/20 0433 12/21/20 0352 12/22/20 0427  Weight: (!) 198.5 kg (!) 201.9 kg (!) 199.6 kg    Examination:  General exam: calm, NAD, obese Respiratory system: Clear to auscultation. Respiratory effort normal. Cardiovascular system: Sinus tachycardia. Gastrointestinal system: Abdomen is nondistended, soft and nontender. . Normal bowel sounds heard. Central nervous system: Alert and oriented. No focal neurological deficits. Extremities: Barely able to lift legs against gravity,  Skin: No rashes, lesions or ulcers Psychiatry: Judgement and insight appear normal. Mood & affect appropriate.     Data Reviewed: I have personally reviewed following labs and imaging studies  CBC: Recent Labs  Lab 12/17/20 0528 12/21/20 0602 12/22/20 0442  WBC 6.5 9.5 10.4  NEUTROABS  --   --  6.0  HGB 9.0* 8.4* 8.4*  HCT 29.5* 26.9* 26.9*  MCV 100.3* 100.4* 103.1*  PLT 290 188 261    Basic Metabolic Panel: Recent Labs  Lab 12/17/20 0528 12/21/20 0602 12/22/20 0442 12/23/20 0524 12/23/20 0836  NA 143 138 136 138  --   K 4.1 3.1* 3.3* 5.7* 3.8  CL 108 102 98 97*  --   CO2 24 29 28 28   --   GLUCOSE 114* 128* 99 91  --   BUN 5* 9 9 12   --   CREATININE 0.52 0.35* 0.37* 0.48  --   CALCIUM 9.2 9.3 8.9 9.1  --   MG  --   --  2.0 2.0  --     GFR: Estimated Creatinine Clearance: 181.3 mL/min (by C-G formula based on SCr of 0.48 mg/dL).  Liver  Function Tests: Recent Labs  Lab 12/17/20 0528 12/21/20 0602  AST 143* 246*  ALT 40 85*  ALKPHOS 59 65  BILITOT 2.5* 3.7*  PROT 7.5 6.6  ALBUMIN 3.1* 2.9*    CBG: Recent Labs  Lab 12/21/20 2238  GLUCAP 110*     Recent Results (from the past 240 hour(s))  CSF culture w Gram Stain     Status: None (Preliminary result)   Collection Time: 12/21/20 12:48 PM   Specimen: PATH Cytology CSF; Cerebrospinal Fluid  Result Value Ref Range Status   Specimen Description   Final    CSF Performed at [facility], [address], [place], [place]    Special Requests   Final    NONE Performed at [facility], [address], [place], [place]    Gram Stain   Final    WBC PRESENT,BOTH PMN AND MONONUCLEAR NO ORGANISMS SEEN Gram Stain Report Called to,Read Back By and Verified With: C.[place], RN AT 1418 ON 09.29.22 BY N.THOMPSON Performed at [facility], [address], [place], [place]    Culture   Final    NO GROWTH 2 DAYS Performed at [facility], [address] [place], [address] [place]    Report Status PENDING  Incomplete         Radiology Studies: No results found.      Scheduled Meds:  acetaminophen  650 mg Oral Daily   bisacodyl  10 mg Rectal Once   busPIRone  5 mg Oral q1800   ciprofloxacin  2 drop Right Eye Q4H while awake   cyanocobalamin  1,000 mcg Intramuscular Daily   diphenhydrAMINE  25 mg  Intravenous Daily   folic acid  1 mg Intravenous Daily   gabapentin  100 mg Oral Q8H   lidocaine (PF)  20 mL Intradermal Once   magic mouthwash w/lidocaine  5 mL Oral TID AC   metoprolol tartrate  10 mg Intravenous Q6H   multivitamin  15 mL Oral Daily   nystatin  5 mL Oral QID   pantoprazole (PROTONIX) IV  40 mg Intravenous Q12H   polyethylene glycol  17 g Oral Daily   prochlorperazine  10 mg Intravenous Q8H   senna-docusate  1 tablet Oral BID   sodium chloride flush   10-40 mL Intracatheter Q12H   Continuous Infusions:  sodium chloride     Immune Globulin 10% Stopped (12/23/20 0221)   ondansetron (ZOFRAN) IV 8 mg (12/15/20 1750)     LOS: 13 days   Time spent: Greater than 50% of this time was spent in counseling, explanation of diagnosis, planning of further management, and coordination of care.   Voice Recognition Reubin [place] dictation system was used to create this note, attempts have been made to correct errors. Please contact the author with questions and/or clarifications.   Albertine Grates, MD PhD FACP [facility]  Available via Epic secure chat 7am-7pm for nonurgent issues Please page for urgent issues To page the attending provider between 7A-7P or the covering provider during after hours 7P-7A, please log into the web site [email or web address] and access using universal [place] password for that web site. If you do not have the password, please call the hospital operator.    12/23/2020, 3:09 PM

## 2020-12-23 NOTE — Progress Notes (Signed)
She tolerated IVIG well last night.  She notes no change currently.  She continues to have 4/5 strength distally in her lower extremities, she is able to lift her legs, but this I think is in part due to her habitus.  She has significant ataxia and 4 -/5 weakness distally in her hands and arms.  She is complaining of some difficulty swallowing for the past few weeks, and I think it may be reasonable to have speech therapy evaluate her given the prominent involvement of her arms.  She continues to be areflexic.  At this time, I would continue IVIG today is day 2/5  Ritta Slot, MD [facility] [phone]  If 7pm- 7am, please page neurology on call as listed in AMION.

## 2020-12-23 NOTE — Progress Notes (Signed)
Occupational Therapy Re-Evaluation Patient Details Name: Carla Little MRN: [id number] DOB: Aug 15, 1990 Today's Date: 12/23/2020   History of present illness Patient is a 30 y.o. female who presented to [facility] for intractable N/V on 9/17. ED labwork reveals e. coli UTI. Pt had recent hospital admission from 8/8-9/5 due to Lt knee dislocation which was reduced with fractures of the proximal fibula ligamentous avulsion laterally as well as medially off the medial femoral condyle and MRI showed complete ACL & PCL tears and MCL strain. That hospital admission was complicated by nausea, vomiting, tachycardia, UTI, and fecal impaction;. PMH significant for morbid obesity, GERD, fatty liver disease, depression. Patient now with guillan barre.   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 Patient continues to demonstrate significant generalized weakness and paraesthesia resulting in decreased functional use in upper extremities. She reports pain, numbness and tingling in both arms. She is able to grossly move her arms but unable to sustain position with decreased motor control. She is near total care at bed level. She requires mod-max assist for rolling and total assist for attempt at transfers - limited by weakness and body habitus. Treatment today focused on self feeding. Patient able to bring styrofoam cup to mouth but not able to drink from it effectively. Therapist provided patient with [employer] Mug and built up handles and positioned in specific spot between bed rails with handle pointed toward patient in order for her to grasp on her own. Patient able to use mug with liquid filled to 300 ml or less. Patient able to bring hand to mouth with built up handle - but with poor motor control and maintaining neutral positioning of utensil which would result in maximum spillage.  Patient  will benefit from continued skilled OT services while in hospital to improve deficits and learn compensatory strategies as needed in order to improve functional abilities. Patient will need prolonged rehab at discharge to recover from this. Return home alone is not feasible with this level of disability.       Recommendations for follow up therapy are one component of a multi-disciplinary discharge planning process, led by the attending physician.  Recommendations may be updated based on patient status, additional functional criteria and insurance authorization.    Follow Up Recommendations  SNF    Equipment Recommendations  None recommended by OT    Recommendations for Other Services      Precautions / Restrictions Precautions Precautions: Fall Precaution Comments: PT Orders from Maureen Ralphs, MD; "WBAT Left LE, only up with a walker, does not need brace at this standpoint." Also in his note says if probalems with mobility - use brace again. Required Braces or Orthoses: Knee Immobilizer - Left Restrictions Weight Bearing Restrictions: Yes LLE Weight Bearing: Weight bearing as tolerated       Mobility   Balance    ADL either performed or assessed with clinical judgement   ADL Overall ADL's : Needs assistance/impaired Eating/Feeding: Bed level;Moderate assistance Eating/Feeding Details (indicate cue type and reason): Patient exhibits poor motor control and poor endurance. Is grosslu able to bring regular styrofoam cup to mouth but due to positioning in bed and body habitus unable to use straw effectively due to having to bend the straw to get it into mouth and due to positioning spilling liquid from cup. Therapist provided patient with [employer]one mug and patient able to use effectively due to handle,bendable straw  and large size tolerating more horizontal positioning without spilling. Patient unable to manage cup effectively with more than 300 ml if fluid. Patient shown built up handles and  can grossly bring to mouth - but positioning poor and would cause max spillage. She reports she can somewhat grasp a couple pieces of her food with hand and bring to mouth.                                         Vision Patient Visual Report: No change from baseline     Perception     Praxis      Cognition Arousal/Alertness: Awake/alert Behavior During Therapy: Flat affect Overall Cognitive Status: Within Functional Limits for tasks assessed                                          Exercises     Shoulder Instructions       General Comments      Pertinent Vitals/ Pain       Faces Pain Scale: Hurts even more Pain Location: BUEs, L > R Pain Descriptors / Indicators: Discomfort;Grimacing;Guarding;Pins and needles;Sore;Spasm Pain Intervention(s): Limited activity within patient's tolerance;Monitored during session  Home Living                                          Prior Functioning/Environment              Frequency  Min 2X/week        Progress Toward Goals  OT Goals(current goals can now be found in the care plan section)  Progress towards OT goals: Goals drowngraded-see care plan  Acute Rehab OT Goals Patient Stated Goal: to get better before going home OT Goal Formulation: With patient Time For Goal Achievement: 01/06/21 Potential to Achieve Goals: Fair  Plan Discharge plan remains appropriate    Co-evaluation          OT goals addressed during session: Proper use of Adaptive equipment and DME;ADL's and self-care      AM-PAC OT "6 Clicks" Daily Activity     Outcome Measure   Help from another person eating meals?: A Lot Help from another person taking care of personal grooming?: A Lot Help from another person toileting, which includes using toliet, bedpan, or urinal?: Total Help from another person bathing (including washing, rinsing, drying)?: A Lot Help from another person to put on  and taking off regular upper body clothing?: Total Help from another person to put on and taking off regular lower body clothing?: Total 6 Click Score: 9    End of Session    OT Visit Diagnosis: Other abnormalities of gait and mobility (R26.89);History of falling (Z91.81);Pain;Muscle weakness (generalized) (M62.81)   Activity Tolerance Patient tolerated treatment well   Patient Left in bed;with call bell/phone within reach   Nurse Communication Mobility status        Time: 4166-0630 OT Time Calculation (min): 27 min  Charges: OT General Charges $OT Visit: 1 Visit OT Evaluation $OT Re-eval: 1 Re-eval OT Treatments $Self Care/Home Management : 8-22 mins  Carla Little, OTR/L Acute Care Rehab Services  Office [phone] Pager: [phone]   Kelli Churn 12/23/2020, 2:44 PM

## 2020-12-23 NOTE — Progress Notes (Signed)
Physical Therapy Treatment Patient Details Name: Carla Little MRN: [id number] DOB: 28-Dec-1990 Today's Date: 12/23/2020   History of Present Illness Patient is a 30 y.o. female who presented to [facility] for intractable N/V on 9/17. ED labwork reveals e. coli UTI. Pt had recent hospital admission from 8/8-9/5 due to Lt knee dislocation which was reduced with fractures of the proximal fibula ligamentous avulsion laterally as well as medially off the medial femoral condyle and MRI showed complete ACL & PCL tears and MCL strain. That hospital admission was complicated by nausea, vomiting, tachycardia, UTI, and fecal impaction;. PMH significant for morbid obesity, GERD, fatty liver disease, depression. Patient now with guillan barre.    PT Comments    Patient reporting pain and tingling when moved and touched. Assisted with bed mobility rolling to each side x 2. Patient does  move arms across chest to reach for rail, cannot grip. Patient  will require a Tenor Lift , or move to a room with a maxi sky.  Will request a Kreg Tilt bed  next visit to facilitate positioning in upright and begin weight through legs in a safe  and controlled manner.  Recommendations for follow up therapy are one component of a multi-disciplinary discharge planning process, led by the attending physician.  Recommendations may be updated based on patient status, additional functional criteria and insurance authorization.  Follow Up Recommendations  SNF     Equipment Recommendations  Wheelchair cushion (measurements PT);Wheelchair (measurements PT)    Recommendations for Other Services       Precautions / Restrictions Precautions Precautions: Fall Precaution Comments: PT Orders from Maureen Ralphs, MD; "WBAT Left LE, only up with a walker, does not need brace at this standpoint." Also in his note says if probalems with mobility - use brace again. Required Braces or Orthoses: Knee Immobilizer - Left Restrictions Weight  Bearing Restrictions: Yes LLE Weight Bearing: Weight bearing as tolerated     Mobility  Bed Mobility   Bed Mobility: Rolling;Sidelying to Sit;Supine to Sit Rolling: +2 for physical assistance;Max assist         General bed mobility comments: patient  with noted decreased UE control to reach for rails, Assist to roll, move pannus  to facilite. rolled x 2 for increased strength and clean pperiarea.    Transfers                    Ambulation/Gait                 Stairs             Wheelchair Mobility    Modified Rankin (Stroke Patients Only)       Balance                                            Cognition Arousal/Alertness: Awake/alert Behavior During Therapy: Flat affect Overall Cognitive Status: Within Functional Limits for tasks assessed                                 General Comments: pt seems depressed, flat affect, now having trouble with BUE coordination and tingling in BUE/LEs      Exercises   General Comments        Pertinent Vitals/Pain Faces Pain Scale: Hurts whole lot Pain Location: BUEs, L >  R Pain Descriptors / Indicators: Discomfort;Grimacing;Guarding;Pins and needles;Sore;Spasm Pain Intervention(s): Limited activity within patient's tolerance;Monitored during session;Premedicated before session    Home Living                      Prior Function            PT Goals (current goals can now be found in the care plan section) Acute Rehab PT Goals Patient Stated Goal: to get better before going home PT Goal Formulation: With patient Time For Goal Achievement: 01/06/21 Potential to Achieve Goals: Fair Progress towards PT goals: Progressing toward goals    Frequency    Min 2X/week      PT Plan Current plan remains appropriate    Co-evaluation       OT goals addressed during session: Proper use of Adaptive equipment and DME;ADL's and self-care      AM-PAC PT "6  Clicks" Mobility   Outcome Measure  Help needed turning from your back to your side while in a flat bed without using bedrails?: Total Help needed moving from lying on your back to sitting on the side of a flat bed without using bedrails?: Total Help needed moving to and from a bed to a chair (including a wheelchair)?: Total Help needed standing up from a chair using your arms (e.g., wheelchair or bedside chair)?: Total Help needed to walk in hospital room?: Total Help needed climbing 3-5 steps with a railing? : Total 6 Click Score: 6    End of Session   Activity Tolerance: Patient limited by fatigue Patient left: in bed;with call bell/phone within reach Nurse Communication: Mobility status;Need for lift equipment PT Visit Diagnosis: Muscle weakness (generalized) (M62.81) Pain - part of body: Knee     Time: 1453-1535 PT Time Calculation (min) (ACUTE ONLY): 42 min  Charges:  $Therapeutic Exercise: 23-37 mins $Therapeutic Activity: 38-52 mins                     Carla Little PT Acute Rehabilitation Services Pager [phone] Office [phone]    Carla Little 12/23/2020, 5:02 PM

## 2020-12-23 NOTE — Progress Notes (Signed)
NIF -40 FVC 1.5L

## 2020-12-24 ENCOUNTER — Inpatient Hospital Stay (HOSPITAL_COMMUNITY): Payer: Medicaid Other

## 2020-12-24 LAB — BASIC METABOLIC PANEL
Anion gap: 5 (ref 5–15)
BUN: 10 mg/dL (ref 6–20)
CO2: 30 mmol/L (ref 22–32)
Calcium: 8.3 mg/dL — ABNORMAL LOW (ref 8.9–10.3)
Chloride: 99 mmol/L (ref 98–111)
Creatinine, Ser: <0.3 mg/dL — ABNORMAL LOW (ref 0.44–1.00)
Glucose, Bld: 97 mg/dL (ref 70–99)
Potassium: 4.1 mmol/L (ref 3.5–5.1)
Sodium: 134 mmol/L — ABNORMAL LOW (ref 135–145)

## 2020-12-24 LAB — CSF CULTURE W GRAM STAIN: Culture: NO GROWTH

## 2020-12-24 LAB — CBC
HCT: 25.5 % — ABNORMAL LOW (ref 36.0–46.0)
Hemoglobin: 7.8 g/dL — ABNORMAL LOW (ref 12.0–15.0)
MCH: 32.2 pg (ref 26.0–34.0)
MCHC: 30.6 g/dL (ref 30.0–36.0)
MCV: 105.4 fL — ABNORMAL HIGH (ref 80.0–100.0)
Platelets: 262 10*3/uL (ref 150–400)
RBC: 2.42 MIL/uL — ABNORMAL LOW (ref 3.87–5.11)
RDW: 30.1 % — ABNORMAL HIGH (ref 11.5–15.5)
WBC: 6.6 10*3/uL (ref 4.0–10.5)
nRBC: 0.5 % — ABNORMAL HIGH (ref 0.0–0.2)

## 2020-12-24 NOTE — Progress Notes (Addendum)
Neurology Progress Note  S: Her tingling in her extremities has resolved. She had a fall with left knee dislocation about 3-4 weeks ago with ligament avulsion, complete ADL tear, PCL tear and medical collateral ligament strain.  No surgery done, but leg immobilizer for one month. Denies HA, n/v, heart burn, reflux, belching. She had a shield type numbness in her upper chest to xyphoid process on last exam and this has resolved.  Denies any chronic "neuropathy" from diabetes.   Neurology consulted on 12/20/20 for parasthesias.   O: Current vital signs: BP 121/64 (BP Location: Left Arm)   Pulse (!) 102   Temp 98 F (36.7 C)   Resp 20   Ht 5\' 4"  (1.626 m)   Wt (!) 199.6 kg   LMP 12/09/2020   SpO2 91%   BMI 75.53 kg/m  Vital signs in last 24 hours: Temp:  [97.7 F (36.5 C)-99 F (37.2 C)] 98 F (36.7 C) (10/01 0552) Pulse Rate:  [99-110] 102 (10/01 0552) Resp:  [18-20] 20 (10/01 0552) BP: (108-121)/(64-76) 121/64 (10/01 0552) SpO2:  [85 %-98 %] 91 % (10/01 0552)  GENERAL: Well appearing, morbidly obese female in NAD. Awake and alert.  HEENT: Normocephalic and atraumatic. LUNGS: Normal respiratory effort.  CV: Slightly tachycardic.  Ext: warm.  NEURO:  Mental Status: Alert and oriented x4.  Speech/Language: speech is without aphasia or dysarthria.  Naming, repetition, fluency, and comprehension intact.  Cranial Nerves:  II: PERRL. Visual fields full.  III, IV, VI: EOMI. Eyelids elevate symmetrically.  V: Sensation is intact to light touch and symmetrical to face. She is unable to feel vibration in center of forehead.  VII: Smile is symmetrical.   VIII: hearing intact to voice. IX, X: Palate elevates symmetrically. Phonation is normal.  XI: Shoulder shrug 5/5. XII: tongue is midline without fasciculations. Motor:  She can lift bilateral ankles off bed about 3 inches.  RUE: grip  4+    bicep  4+     tricep  4+ 03-09-1990 to lift thigh. Plantar and dorsiflexion 4/5.  LUE:  grip  4+  bicep 4+   tricep 4+ LLE: Unable to lift thigh. Unable to resist bending of knee due to pain. Plantar and dorsiflexion 4.  Tone: is normal and bulk is normal. Sensation- Intact to light touch bilaterally.FGH:WEXHBZ Extinction absent to DSS UEs.  Vibration is decreased in bilateral toes. Vibration is intact from ankles to knees and intact to fingers/wrist. Sharp intact bilaterally from toes to thighs and fingers to shoulder. No shield sensory loss today.  Coordination: FTN intact bilaterally. Unable to perform HKS. No drift UEs. , HKS: no ataxia in BLE. No drift.  DTRs:  RUE:  brachioradialis  1    RLE:  patella  0   tibial  0 LUE:  brachioradialis 1    LLE:  patella  0  tibial 0 Gait- deferred.  Medications  Current Facility-Administered Medications:    0.9 %  sodium chloride infusion, , Intravenous, PRN, Marland Kitchen, NP   acetaminophen (TYLENOL) tablet 650 mg, 650 mg, Oral, Q6H PRN **OR** acetaminophen (TYLENOL) suppository 650 mg, 650 mg, Rectal, Q6H PRN, Luiz Iron, MD   acetaminophen (TYLENOL) tablet 650 mg, 650 mg, Oral, Daily, Toberman, Stevi W, NP, 650 mg at 12/23/20 1949   bisacodyl (DULCOLAX) suppository 10 mg, 10 mg, Rectal, Once, 12/25/20, MD   busPIRone (BUSPAR) tablet 5 mg, 5 mg, Oral, q1800, Cinderella, Margaret A, 5 mg at 12/23/20 1700   ciprofloxacin (  CILOXAN) 0.3 % ophthalmic solution 2 drop, 2 drop, Right Eye, Q4H while awake, Bobette Mo, MD, 2 drop at 12/24/20 0945   cyanocobalamin ((VITAMIN B-12)) injection 1,000 mcg, 1,000 mcg, Intramuscular, Daily, Rai, Ripudeep K, MD, 1,000 mcg at 12/24/20 0901   diphenhydrAMINE (BENADRYL) 12.5 MG/5ML elixir 25 mg, 25 mg, Oral, Q6H PRN, Rai, Ripudeep K, MD, 25 mg at 12/21/20 2230   diphenhydrAMINE (BENADRYL) injection 25 mg, 25 mg, Intravenous, Daily, Toberman, Stevi W, NP, 25 mg at 12/23/20 1948   folic acid injection 1 mg, 1 mg, Intravenous, Daily, Rai, Ripudeep K, MD, 1 mg at 12/23/20 1005    gabapentin (NEURONTIN) 250 MG/5ML solution 100 mg, 100 mg, Oral, Q8H, Rai, Ripudeep K, MD, 100 mg at 12/24/20 0539   HYDROmorphone (DILAUDID) injection 0.5 mg, 0.5 mg, Intravenous, Q4H PRN, Zannie Cove, MD, 0.5 mg at 12/18/20 0916   Immune Globulin 10% (PRIVIGEN) IV infusion 80 g, 400 mg/kg, Intravenous, Q24 Hr x 5, Kirkpatrick, Hardin Negus, MD, Stopped at 12/24/20 0026   lidocaine (PF) (XYLOCAINE) 1 % injection 20 mL, 20 mL, Intradermal, Once, Rejeana Brock, MD   magic mouthwash w/lidocaine, 5 mL, Oral, TID AC, Rai, Ripudeep K, MD, 5 mL at 12/24/20 0901   menthol-cetylpyridinium (CEPACOL) lozenge 3 mg, 1 lozenge, Oral, PRN, Blount, Xenia T, NP, 3 mg at 12/14/20 1855   metoprolol tartrate (LOPRESSOR) injection 10 mg, 10 mg, Intravenous, Q6H, Rodolph Bong, MD, 10 mg at 12/24/20 2831   multivitamin liquid 15 mL, 15 mL, Oral, Daily, Rejeana Brock, MD, 15 mL at 12/24/20 0902   ondansetron (ZOFRAN) tablet 8 mg, 8 mg, Oral, Q6H PRN, 8 mg at 12/13/20 0648 **OR** ondansetron (ZOFRAN) 8 mg in sodium chloride 0.9 % 50 mL IVPB, 8 mg, Intravenous, Q6H PRN, Bobette Mo, MD, Last Rate: 216 mL/hr at 12/15/20 1750, 8 mg at 12/15/20 1750   oxyCODONE (ROXICODONE) 5 MG/5ML solution 5 mg, 5 mg, Oral, Q4H PRN, Rai, Ripudeep K, MD, 5 mg at 12/24/20 0858   pantoprazole (PROTONIX) injection 40 mg, 40 mg, Intravenous, Q12H, Bobette Mo, MD, 40 mg at 12/24/20 0945   phenol (CHLORASEPTIC) mouth spray 1 spray, 1 spray, Mouth/Throat, PRN, Rai, Ripudeep K, MD   polyethylene glycol (MIRALAX / GLYCOLAX) packet 17 g, 17 g, Oral, Daily, Albertine Grates, MD, 17 g at 12/22/20 1018   prochlorperazine (COMPAZINE) injection 10 mg, 10 mg, Intravenous, Q8H, Rodolph Bong, MD, 10 mg at 12/24/20 0538   senna-docusate (Senokot-S) tablet 1 tablet, 1 tablet, Oral, BID, Rodolph Bong, MD, 1 tablet at 12/24/20 0947   sodium chloride flush (NS) 0.9 % injection 10-40 mL, 10-40 mL, Intracatheter, Q12H, Rai,  Ripudeep K, MD, 10 mL at 12/24/20 0946   sodium chloride flush (NS) 0.9 % injection 10-40 mL, 10-40 mL, Intracatheter, PRN, Rai, Ripudeep K, MD  Pertinent Labs Vitamin B12 153. Vitamin B1 normal. Copper normal.   No new Imaging.   Assessment: 30 year old female with diffuse hypoesthesia, areflexia, ataxia some distal weakness most consistent with an acute neuropathy. On her exam today, light sensory is normal to UEs, diminished to some toes on each foot, and diminished below right knee. Sharp intact to all 4 extremities. Not diminished on LLE to abdomen. She remains areflexic in LEs. 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 IVIG x 5 days ordered, this is 4th day.   Impression: -Parasthesias, improved.  -NIF/VC are stable.  Recommendations/Plan:  -Continue IVIG x 5 days.  -continue NIF/VC q 12 hours.  -Continue B12 and Folate replacement.   Pt seen by Jimmye Norman, MSN, APN-BC/Nurse Practitioner/Neuro and later by MD. Note and plan to be edited as needed by MD.  Pager: [phone]

## 2020-12-24 NOTE — Progress Notes (Signed)
Occupational Therapy Treatment Patient Details Name: Carla Little MRN: [id number] DOB: 08/03/1990 Today's Date: 12/24/2020   History of present illness Patient is a 30 y.o. female who presented to [facility] for intractable N/V on 9/17. ED labwork reveals e. coli UTI. Pt had recent hospital admission from 8/8-9/5 due to Lt knee dislocation which was reduced with fractures of the proximal fibula ligamentous avulsion laterally as well as medially off the medial femoral condyle and MRI showed complete ACL & PCL tears and MCL strain. That hospital admission was complicated by nausea, vomiting, tachycardia, UTI, and fecal impaction;. PMH significant for morbid obesity, GERD, fatty liver disease, depression. Patient now with guillan barre.   OT comments  Patient would benefit from room with [facility] to be able to safely participate in time out of bed. Patient was able to participate in scooping sherbet on this date with 5 reps with no spillage noted. Patient had no pocketing with sherbet. Patient's discharge plan remains appropriate at this time. OT will continue to follow acutely.     Recommendations for follow up therapy are one component of a multi-disciplinary discharge planning process, led by the attending physician.  Recommendations may be updated based on patient status, additional functional criteria and insurance authorization.    Follow Up Recommendations  SNF    Equipment Recommendations  None recommended by OT    Recommendations for Other Services      Precautions / Restrictions Precautions Precautions: Fall Precaution Comments: PT Orders from Maureen Ralphs, MD; "WBAT Left LE, only up with a walker, does not need brace at this standpoint." Also in his note says if probalems with mobility - use brace again. Required Braces or Orthoses: Knee Immobilizer - Left Knee Immobilizer - Left: On at all times Other Brace: ok to loosen Left brace when resting. Per Dr. Magnus Ivan, OK to use  KI vs  [facility] . Have modified the KI and added 2 metal stays across the anterior knee(from another  KI.) Restrictions Weight Bearing Restrictions: Yes Other Position/Activity Restrictions: PT Orders from Maureen Ralphs, MD; "WBAT Left LE, only up with a walker, does not need brace at this standpoint"       Mobility Bed Mobility Overal bed mobility: Needs Assistance Bed Mobility: Rolling Rolling: +2 for physical assistance;Max assist              Transfers                      Balance                                           ADL either performed or assessed with clinical judgement   ADL Overall ADL's : Needs assistance/impaired Eating/Feeding: Bed level;Set up;Minimal assistance Eating/Feeding Details (indicate cue type and reason): patient was able to hold sherbert cup in R hand and scoop with built up spoon for 5 bites with no spillage noted. patient was noted to be able to pick up spoon after loosing grip onto chest with increased time. patient declined to continue eating at this time. patietn was educated on benefits of using LUE at this time with demonstrating move movement of LUE and strenght than RUE at this time. patients mouth was cleared with max A wth increased time to get around edema in bilateral cheeks on this date. nurse was consulted about suction in room for patient to  use. nurse to look into.                                         Vision Patient Visual Report: No change from baseline     Perception     Praxis      Cognition Arousal/Alertness: Awake/alert Behavior During Therapy: Flat affect Overall Cognitive Status: Within Functional Limits for tasks assessed                                          Exercises General Exercises - Upper Extremity Shoulder Flexion: AAROM;Supine;Both;10 reps   Shoulder Instructions       General Comments patient noted to have increased edema in BUE with education  on keeping pillows under BUE when at rest in beds. patient verbalized that she does use them but they fall out after awhile. patient was positioned with pillows to reduce edema in BUE at end of session.    Pertinent Vitals/ Pain       Pain Assessment: Faces Faces Pain Scale: Hurts little more Pain Location: BUEs, L > R Pain Descriptors / Indicators: Discomfort;Grimacing;Guarding;Pins and needles;Sore;Spasm Pain Intervention(s): Limited activity within patient's tolerance;Monitored during session  Home Living                                          Prior Functioning/Environment              Frequency  Min 2X/week        Progress Toward Goals  OT Goals(current goals can now be found in the care plan section)  Progress towards OT goals: Progressing toward goals  Acute Rehab OT Goals Patient Stated Goal: to get better before going home  Plan Discharge plan remains appropriate    Co-evaluation                 AM-PAC OT "6 Clicks" Daily Activity     Outcome Measure   Help from another person eating meals?: A Lot Help from another person taking care of personal grooming?: A Lot Help from another person toileting, which includes using toliet, bedpan, or urinal?: Total Help from another person bathing (including washing, rinsing, drying)?: A Lot Help from another person to put on and taking off regular upper body clothing?: Total Help from another person to put on and taking off regular lower body clothing?: Total 6 Click Score: 9    End of Session    OT Visit Diagnosis: Other abnormalities of gait and mobility (R26.89);History of falling (Z91.81);Pain;Muscle weakness (generalized) (M62.81) Pain - Right/Left: Left Pain - part of body: Knee   Activity Tolerance Patient tolerated treatment well   Patient Left in bed;with call bell/phone within reach   Nurse Communication Other (comment) (self feeding and need for suction in room)         Time: 1520-1600 OT Time Calculation (min): 40 min  Charges: OT General Charges $OT Visit: 1 Visit OT Treatments $Self Care/Home Management : 38-52 mins  Sharyn Blitz OTR/L, MS Acute Rehabilitation Department Office# [phone] Pager# [phone]   Chalmers Guest Ron Beske 12/24/2020, 4:11 PM

## 2020-12-24 NOTE — Progress Notes (Signed)
NIF -40+ FVC 1.75L

## 2020-12-24 NOTE — Progress Notes (Signed)
PROGRESS NOTE    Carla Little  YHC:[id number] DOB: 06/03/1990 DOA: 12/09/2020 PCP: Patient, No Pcp Per (Inactive)    Chief Complaint  Patient presents with   Emesis   Nausea    Brief Narrative:  Patient is a 30 year old female with morbid obesity, nonalcoholic fatty liver disease, GERD, depression, recently was hospitalized following a mechanical fall with a left knee dislocation, resultant MCL strain, ACL and PCL rupture then complicated by nausea vomiting, tachycardia, UTI and fecal impaction during the hospitalization. Patient had an endoscopy which noted small antral ulcer on 8/27, RUQ ultrasound was unremarkable.  Patient presented to ED with multiple episodes of nausea, vomiting, aches and pains everywhere. In ED, afebrile, mildly tachycardic, hypertensive.   Subjective:  No further issues with IVIG infusion with premedication No overnight event,  No new complaints this am, remain weak, denies pain, denies bowel and bladder incontinence  No n/v  Assessment & Plan:   Principal Problem:   Intractable vomiting with nausea Active Problems:   Left knee dislocation   PUD (peptic ulcer disease)   Nonalcoholic steatohepatitis (NASH)   Class 3 obesity (HCC)   Sinus tachycardia   Hypokalemia   GERD (gastroesophageal reflux disease)   Iron deficiency anemia due to chronic blood loss   E. coli UTI  Intractable nausea vomiting --Seen by GI during recent hospitalization and this hospitalization, s/p EGD ( on 8/29), HIDA scan on 9/23, CT angio abdomen pelvis on 9/16 -Thought possibly due to gastroparesis, esophagitis, opioid use -Was on IV Ancef Flagyl, Reglan, currently on nystatin/lidocaine Magic mouthwash, iv dilfucan x7 days started by previous attending  -GI recommend minimize narcotic use, gi signed off on 9/26 -appear improving  Paresthesias/weakness -Status post lumbar puncture suspected GBS -Plan per neurology  Hypokalemia Improved after supplement, monitor    B12 deficiency, folate deficiency -Started on B12 injections IM until patient able to tolerate p.o. pills, continue folic acid   E. coli UTI -Urine culture showed more than 100,000 colonies of E. coli and 20,000 colonies of Proteus mirabilis -IV Ancef and Flagyl discontinued   Recent left knee dislocation -Followed by orthopedics, appreciate recommendations by Dr. Magnus Ivan  -Recommended continue brace for another 2 to 4 weeks  -PT recommending SNF   Sinus tachycardia -Continue beta-blocker IV   Hypothyroidism -TSH 5.2, free T4 1.3   GERD -Continue PPI   Iron deficiency anemia -History of heavy menorrhagia on her cycle, required prbc transfusion during recent hospitalization  -monitor H&H   History of depression She is seen by psychiatry on 9/26, she is deemed low risk of self harm, she is continued on buspirone 5mg  with dinner   Morbid obesity: Body mass index is 75.53 kg/m.     Skin Assessment:  Reason for Consult: intertriginous dermatitis, irritant contact dermatitis at skin folds: inframammary, subpannicular and bilateral inguinal. Stage 2 to sacrum Seen by wound care on 9/18  I have examined the patient's skin and I agree with the wound assessment as performed by the wound care RN as outlined below:  Pressure Injury 11/19/20 Buttocks Right Stage 2 -  Partial thickness loss of dermis presenting as a shallow open injury with a red, pink wound bed without slough. (Active)  11/19/20 2100  Location: Buttocks  Location Orientation: Right  Staging: Stage 2 -  Partial thickness loss of dermis presenting as a shallow open injury with a red, pink wound bed without slough.  Wound Description (Comments):   Present on Admission:     Unresulted Labs (From  admission, onward)     Start     Ordered   12/23/20 1509  Basic metabolic panel  Every Mon-Wed-Fri,   R     Question:  Specimen collection method  Answer:  Lab=Lab collect   12/23/20 1509   12/23/20 1508  CBC   Every Mon-Wed-Fri,   R     Question:  Specimen collection method  Answer:  Lab=Lab collect   12/23/20 1508   12/23/20 0500  Basic metabolic panel  Daily,   R     Question:  Specimen collection method  Answer:  Lab=Lab collect   12/22/20 1058   12/21/20 0500  Vitamin E  Tomorrow morning,   R       Question:  Specimen collection method  Answer:  IV Team=IV Team collect   12/20/20 2110   12/14/20 0500  CBC with Differential/Platelet  Tomorrow morning,   R        12/13/20 1713              DVT prophylaxis: Place and maintain sequential compression device Start: 12/24/20 1554 SCDs Start: 12/09/20 1937   Code Status: Full Family Communication: Patient Disposition:   Status is: Inpatient   Dispo: The patient is from: Home              Anticipated d/c is to: SNF              Anticipated d/c date is: To be determined, need to finish IVIG                Consultants:  Neurology Psychiatry GI Wound care  Procedures:  Lp by IR on 9/28  Antimicrobials:    Anti-infectives (From admission, onward)    Start     Dose/Rate Route Frequency Ordered Stop   12/17/20 1100  fluconazole (DIFLUCAN) IVPB 100 mg        100 mg 50 mL/hr over 60 Minutes Intravenous Daily 12/17/20 1046 12/23/20 1125   12/13/20 2200  metroNIDAZOLE (FLAGYL) IVPB 500 mg  Status:  Discontinued        500 mg 100 mL/hr over 60 Minutes Intravenous Every 8 hours 12/13/20 2107 12/16/20 1620   12/13/20 1000  ceFAZolin (ANCEF) IVPB 2g/100 mL premix  Status:  Discontinued        2 g 200 mL/hr over 30 Minutes Intravenous Every 8 hours 12/13/20 0927 12/16/20 1721   12/12/20 1000  cefTRIAXone (ROCEPHIN) 2 g in sodium chloride 0.9 % 100 mL IVPB  Status:  Discontinued        2 g 200 mL/hr over 30 Minutes Intravenous Every 24 hours 12/12/20 0901 12/13/20 0928   12/11/20 0845  cefTRIAXone (ROCEPHIN) 1 g in sodium chloride 0.9 % 100 mL IVPB  Status:  Discontinued        1 g 200 mL/hr over 30 Minutes Intravenous Every 24  hours 12/11/20 0758 12/12/20 0901          Objective: Vitals:   12/23/20 2157 12/24/20 0552 12/24/20 1359 12/24/20 1607  BP: 108/64 121/64 135/69 (!) 109/51  Pulse: 99 (!) 102 (!) 118 100  Resp: 18 20 14 18   Temp: 97.7 F (36.5 C) 98 F (36.7 C) 98.4 F (36.9 C) 98.4 F (36.9 C)  TempSrc: Oral  Oral   SpO2: 93% 91% (!) 82% 98%  Weight:      Height:        Intake/Output Summary (Last 24 hours) at 12/24/2020 2017 Last data filed at 12/24/2020 1300  Gross per 24 hour  Intake 1565 ml  Output 200 ml  Net 1365 ml   Filed Weights   12/20/20 0433 12/21/20 0352 12/22/20 0427  Weight: (!) 198.5 kg (!) 201.9 kg (!) 199.6 kg    Examination:  General exam: calm, NAD, obese Respiratory system: Clear to auscultation. Respiratory effort normal. Cardiovascular system: Sinus tachycardia. Gastrointestinal system: Abdomen is nondistended, soft and nontender. . Normal bowel sounds heard. Central nervous system: Alert and oriented. No focal neurological deficits. Extremities: Barely able to lift legs against gravity,  Skin: No rashes, lesions or ulcers Psychiatry: Judgement and insight appear normal. Mood & affect appropriate.     Data Reviewed: I have personally reviewed following labs and imaging studies  CBC: Recent Labs  Lab 12/21/20 0602 12/22/20 0442 12/23/20 1826 12/24/20 0643  WBC 9.5 10.4 7.4 6.6  NEUTROABS  --  6.0  --   --   HGB 8.4* 8.4* 8.3* 7.8*  HCT 26.9* 26.9* 26.7* 25.5*  MCV 100.4* 103.1* 104.3* 105.4*  PLT 188 261 250 262    Basic Metabolic Panel: Recent Labs  Lab 12/21/20 0602 12/22/20 0442 12/23/20 0524 12/23/20 0836 12/23/20 1826 12/24/20 0643  NA 138 136 138  --  137 134*  K 3.1* 3.3* 5.7* 3.8 3.8 4.1  CL 102 98 97*  --  100 99  CO2 29 28 28   --  31 30  GLUCOSE 128* 99 91  --  105* 97  BUN 9 9 12   --  11 10  CREATININE 0.35* 0.37* 0.48  --  0.38* <0.30*  CALCIUM 9.3 8.9 9.1  --  8.8* 8.3*  MG  --  2.0 2.0  --   --   --      GFR: CrCl cannot be calculated (This lab value cannot be used to calculate CrCl because it is not a number: <0.30).  Liver Function Tests: Recent Labs  Lab 12/21/20 0602  AST 246*  ALT 85*  ALKPHOS 65  BILITOT 3.7*  PROT 6.6  ALBUMIN 2.9*    CBG: Recent Labs  Lab 12/21/20 2238  GLUCAP 110*     Recent Results (from the past 240 hour(s))  CSF culture w Gram Stain     Status: None   Collection Time: 12/21/20 12:48 PM   Specimen: PATH Cytology CSF; Cerebrospinal Fluid  Result Value Ref Range Status   Specimen Description   Final    CSF Performed at [facility], [address], [place], [place]    Special Requests   Final    NONE Performed at [facility], [address] [place], [place]    Gram Stain   Final    WBC PRESENT,BOTH PMN AND MONONUCLEAR NO ORGANISMS SEEN Gram Stain Report Called to,Read Back By and Verified With: C.[place], RN AT 1418 ON 09.29.22 BY N.THOMPSON Performed at [facility], [address] [place], [place]    Culture   Final    NO GROWTH 3 DAYS Performed at [facility], [address], [place], [address] [place]    Report Status 12/24/2020 FINAL  Final         Radiology Studies: DG CHEST PORT 1 VIEW  Result Date: 12/24/2020 CLINICAL DATA:  Hypoxia. EXAM: PORTABLE CHEST 1 VIEW COMPARISON:  12/11/2020 FINDINGS: Stable elevation of the RIGHT hemidiaphragm. Heart size is accentuated by technique. No focal consolidations or pleural effusions. IMPRESSION: Elevation of the RIGHT hemidiaphragm, stable. Electronically Signed  By: Norva Pavlov M.D.   On: 12/24/2020 18:17        Scheduled Meds:  acetaminophen  650 mg Oral Daily   bisacodyl  10 mg Rectal Once   busPIRone  5 mg Oral q1800   ciprofloxacin  2 drop Right Eye Q4H while awake   cyanocobalamin  1,000 mcg Intramuscular Daily   diphenhydrAMINE  25 mg Intravenous Daily   folic  acid  1 mg Intravenous Daily   gabapentin  100 mg Oral Q8H   lidocaine (PF)  20 mL Intradermal Once   magic mouthwash w/lidocaine  5 mL Oral TID AC   metoprolol tartrate  10 mg Intravenous Q6H   multivitamin  15 mL Oral Daily   pantoprazole (PROTONIX) IV  40 mg Intravenous Q12H   polyethylene glycol  17 g Oral Daily   prochlorperazine  10 mg Intravenous Q8H   senna-docusate  1 tablet Oral BID   sodium chloride flush  10-40 mL Intracatheter Q12H   Continuous Infusions:  sodium chloride     Immune Globulin 10% Stopped (12/24/20 0026)   ondansetron (ZOFRAN) IV 8 mg (12/15/20 1750)     LOS: 14 days   Time spent: Greater than 50% of this time was spent in counseling, explanation of diagnosis, planning of further management, and coordination of care.   Voice Recognition Reubin [place] dictation system was used to create this note, attempts have been made to correct errors. Please contact the author with questions and/or clarifications.   Albertine Grates, MD PhD FACP [facility]  Available via Epic secure chat 7am-7pm for nonurgent issues Please page for urgent issues To page the attending provider between 7A-7P or the covering provider during after hours 7P-7A, please log into the web site [email or web address] and access using universal [place] password for that web site. If you do not have the password, please call the hospital operator.    12/24/2020, 8:17 PM

## 2020-12-24 NOTE — Progress Notes (Signed)
   12/24/20 1359  Assess: MEWS Score  Temp 98.4 F (36.9 C)  BP 135/69  Pulse Rate (!) 118  Resp 14  Level of Consciousness Alert  SpO2 (!) 82 %  O2 Device Room Air  Assess: MEWS Score  MEWS Temp 0  MEWS Systolic 0  MEWS Pulse 2  MEWS RR 0  MEWS LOC 0  MEWS Score 2  MEWS Score Color Yellow  Assess: if the MEWS score is Yellow or Red  Were vital signs taken at a resting state? Yes  Focused Assessment Change from prior assessment (see assessment flowsheet)  Does the patient meet 2 or more of the SIRS criteria? No  Does the patient have a confirmed or suspected source of infection? No  Provider and Rapid Response Notified? Yes (MD notfied)  MEWS guidelines implemented *See Row Information* No, vital signs rechecked  Treat  MEWS Interventions Other (Comment) (O2 given)  Pain Scale 0-10  Pain Score 0  Faces Pain Scale 0  Complains of Other (Comment) (malaise)  Interventions Relaxation;Dark room  Notify: Charge Nurse/RN  Name of Charge Nurse/RN Notified Theatre stage manager  Date Charge Nurse/RN Notified 12/24/20  Time Charge Nurse/RN Notified 1400  Notify: Provider  Provider Name/Title Dr. Roda Shutters  Date Provider Notified 12/24/20  Time Provider Notified 1433  Notification Type Page  Notification Reason Other (Comment) (Update)  Provider response Other (Comment)  Notify: Rapid Response  Name of Rapid Response RN Notified  (no)  Document  Patient Outcome Other (Comment) (Continuing to monitor.)  Progress note created (see row info) Yes  Assess: SIRS CRITERIA  SIRS Temperature  0  SIRS Pulse 1  SIRS Respirations  0  SIRS WBC 0  SIRS Score Sum  1  Did not initiate mews, going to monitor discussed with Press photographer.

## 2020-12-24 NOTE — Progress Notes (Signed)
   12/24/20 1359  Assess: MEWS Score  Temp 98.4 F (36.9 C)  BP 135/69  Pulse Rate (!) 118  Resp 14  Level of Consciousness Alert  SpO2 (!) 82 %  O2 Device Room Air  Assess: MEWS Score  MEWS Temp 0  MEWS Systolic 0  MEWS Pulse 2  MEWS RR 0  MEWS LOC 0  MEWS Score 2  MEWS Score Color Yellow  Assess: if the MEWS score is Yellow or Red  Were vital signs taken at a resting state? Yes  Focused Assessment Change from prior assessment (see assessment flowsheet)  Does the patient meet 2 or more of the SIRS criteria? No  Does the patient have a confirmed or suspected source of infection? No  Provider and Rapid Response Notified? Yes (MD notfied)  MEWS guidelines implemented *See Row Information* No, vital signs rechecked  Treat  MEWS Interventions Other (Comment) (O2 given)  Pain Scale 0-10  Pain Score 0  Faces Pain Scale 0  Complains of Other (Comment) (malaise)  Interventions Relaxation;Dark room  Take Vital Signs  Increase Vital Sign Frequency  Yellow: Q 2hr X 2 then Q 4hr X 2, if remains yellow, continue Q 4hrs  Escalate  MEWS: Escalate Yellow: discuss with charge nurse/RN and consider discussing with provider and RRT  Notify: Charge Nurse/RN  Name of Charge Nurse/RN Notified Toniann Fail RN  Date Charge Nurse/RN Notified 12/24/20  Time Charge Nurse/RN Notified 1400  Notify: Provider  Provider Name/Title Dr. Roda Shutters  Date Provider Notified 12/24/20  Time Provider Notified 1433  Notification Type Page  Notification Reason Other (Comment) (Update)  Provider response Other (Comment)  Notify: Rapid Response  Name of Rapid Response RN Notified  (no)  Document  Patient Outcome Other (Comment) (Continuing to monitor.)  Progress note created (see row info) Yes  Assess: SIRS CRITERIA  SIRS Temperature  0  SIRS Pulse 1  SIRS Respirations  0  SIRS WBC 0  SIRS Score Sum  1

## 2020-12-24 NOTE — Progress Notes (Signed)
NIF -40 FVC 1.7L

## 2020-12-25 LAB — BASIC METABOLIC PANEL
Anion gap: 6 (ref 5–15)
BUN: 9 mg/dL (ref 6–20)
CO2: 29 mmol/L (ref 22–32)
Calcium: 8.8 mg/dL — ABNORMAL LOW (ref 8.9–10.3)
Chloride: 100 mmol/L (ref 98–111)
Creatinine, Ser: <0.3 mg/dL — ABNORMAL LOW (ref 0.44–1.00)
Glucose, Bld: 95 mg/dL (ref 70–99)
Potassium: 5.1 mmol/L (ref 3.5–5.1)
Sodium: 135 mmol/L (ref 135–145)

## 2020-12-25 LAB — VITAMIN E
Vitamin E (Alpha Tocopherol): 7.9 mg/L (ref 5.9–19.4)
Vitamin E(Gamma Tocopherol): 1.4 mg/L (ref 0.7–4.9)

## 2020-12-25 MED ORDER — FOLIC ACID 1 MG PO TABS
1.0000 mg | ORAL_TABLET | Freq: Every day | ORAL | Status: DC
  Administered 2020-12-26 – 2021-01-26 (×31): 1 mg via ORAL
  Filled 2020-12-25 (×32): qty 1

## 2020-12-25 MED ORDER — CYANOCOBALAMIN 1000 MCG/ML IJ SOLN
1000.0000 ug | INTRAMUSCULAR | Status: AC
Start: 2021-01-01 — End: 2021-01-22
  Administered 2021-01-01 – 2021-01-22 (×4): 1000 ug via INTRAMUSCULAR
  Filled 2020-12-25 (×4): qty 1

## 2020-12-25 MED ORDER — PANTOPRAZOLE SODIUM 40 MG PO TBEC
40.0000 mg | DELAYED_RELEASE_TABLET | Freq: Two times a day (BID) | ORAL | Status: DC
  Administered 2020-12-25 – 2021-01-26 (×64): 40 mg via ORAL
  Filled 2020-12-25 (×65): qty 1

## 2020-12-25 NOTE — Evaluation (Signed)
Clinical/Bedside Swallow Evaluation Patient Details  Name: Carla Little MRN: [id number] Date of Birth: [birth year]  Today's Date: 12/25/2020 Time: SLP Start Time (ACUTE ONLY): 0920 SLP Stop Time (ACUTE ONLY): 0930 SLP Time Calculation (min) (ACUTE ONLY): 10 min  Past Medical History:  Past Medical History:  Diagnosis Date   Class 3 obesity 12/09/2020   Depression    GERD (gastroesophageal reflux disease)    Nonalcoholic steatohepatitis (NASH) 12/09/2020   Obesity    PUD (peptic ulcer disease) 12/09/2020   Past Surgical History:  Past Surgical History:  Procedure Laterality Date   BIOPSY  11/19/2020   Procedure: BIOPSY;  Surgeon: Charna Elizabeth, MD;  Location: [facility];  Service: Endoscopy;;   ESOPHAGOGASTRODUODENOSCOPY (EGD) WITH PROPOFOL N/A 11/19/2020   Procedure: ESOPHAGOGASTRODUODENOSCOPY (EGD) WITH PROPOFOL;  Surgeon: Charna Elizabeth, MD;  Location: [facility];  Service: Endoscopy;  Laterality: N/A;   HPI:  Patient is a 30 year old female with morbid obesity, nonalcoholic fatty liver disease, GERD, depression, recently was hospitalized following a mechanical fall with a left knee dislocation, resultant MCL strain, ACL and PCL rupture then complicated by nausea vomiting, tachycardia, UTI and fecal impaction during the hospitalization. Progressive weakness during this hospitalization. 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    Assessment / Plan / Recommendation  Clinical Impression  Carla Little demonstrates a moderate oral dysphagia with no s/s of pharyngeal dysphagia. Oral phase is c/b R sided weakness, asymmetry, and reduced sensation. She was found with a dime-sized piece of chicken in R buccal cavity along teeth-line. She had no sensation of this and hasn't had chicken since lunch yesterday. She was unable to remove with lingual sweep, so SLP removed. She had no s/s  aspiration or dysphagia with sequential straw sips of thin liquids. She had no difficulty with purees, but had pocketing with graham cracker, which she cleared by rinsing/swishing water in cheeks then swallowing.   SLP discussed the benefits of her consuming a pureed diet in aiding oral clearance, but her appetite is already very poor right now. She reports she only wants to drink ensures for nutrition, but would like for diet to remain as "regular" so she can order other foods as able (would like to be able to have soup with crackers and would let the crackers get soft in soup before eating, etc.) given that cognition is adequate for making these choices, will keep diet in as regular with her ordering foods she can handle; avoiding meats and breads.  Also recommend feeder check R buccal cavity at end of meals to ensure clearance; check oral cavity for clearance with meds as well. SLP service will follow, as swallow performance may decline further given diagnosis of GBS.  SLP Visit Diagnosis: Dysphagia, oral phase (R13.11);Dysarthria and anarthria (R47.1)    Aspiration Risk  Mild aspiration risk    Diet Recommendation Dysphagia 1 (Puree);Thin liquid   Liquid Administration via: Straw Medication Administration: Whole meds with liquid Supervision: Staff to assist with self feeding Compensations: Multiple dry swallows after each bite/sip;Follow solids with liquid;Lingual sweep for clearance of pocketing Postural Changes: Seated upright at 90 degrees;Remain upright for at least 30 minutes after po intake    Other  Recommendations Oral Care Recommendations: Oral care BID    Recommendations for follow up therapy are one component of a multi-disciplinary discharge planning process, led by the attending physician.  Recommendations may be updated based on patient status, additional functional criteria and insurance  authorization.  Follow up Recommendations Inpatient Rehab      Frequency and Duration  min 2x/week  2 weeks       Prognosis Prognosis for Safe Diet Advancement: Good      Swallow Study   General Date of Onset: 12/22/20 HPI: Patient is a 30 year old female with morbid obesity, nonalcoholic fatty liver disease, GERD, depression, recently was hospitalized following a mechanical fall with a left knee dislocation, resultant MCL strain, ACL and PCL rupture then complicated by nausea vomiting, tachycardia, UTI and fecal impaction during the hospitalization. Progressive weakness during this hospitalization. 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 Type of Study: Bedside Swallow Evaluation Previous Swallow Assessment: none Diet Prior to this Study: Dysphagia 3 (soft);Thin liquids Temperature Spikes Noted: No Respiratory Status: Room air History of Recent Intubation: No Behavior/Cognition: Alert;Cooperative;Pleasant mood Oral Cavity Assessment: Within Functional Limits;Other (comment) (residue of food) Oral Care Completed by SLP: Yes Oral Cavity - Dentition: Adequate natural dentition Vision: Functional for self-feeding Self-Feeding Abilities: Needs assist Patient Positioning: Upright in bed Baseline Vocal Quality: Low vocal intensity Volitional Cough: Weak Volitional Swallow: Able to elicit    Oral/Motor/Sensory Function Overall Oral Motor/Sensory Function: Moderate impairment Facial ROM: Reduced right Facial Symmetry: Abnormal symmetry right Facial Strength: Reduced right Facial Sensation: Reduced right Lingual ROM: Reduced right Lingual Symmetry: Abnormal symmetry right Lingual Strength: Reduced Lingual Sensation: Reduced Velum: Impaired right Mandible: Impaired   Ice Chips Ice chips: Not tested   Thin Liquid Thin Liquid: Within functional limits Presentation: Straw    Nectar Thick Nectar Thick Liquid: Not tested   Honey Thick Honey Thick Liquid: Not tested    Puree Puree: Within functional limits Presentation: Spoon   Solid     Solid: Impaired Presentation: Spoon Oral Phase Functional Implications: Impaired mastication;Oral residue;Right lateral sulci pocketing     Carla Little, M.S., CCC-SLP Speech-Language Pathologist Acute Rehabilitation Services Pager: [phone]  Carla Little 12/25/2020,10:14 AM

## 2020-12-25 NOTE — Progress Notes (Addendum)
Neurology Progress Note  S: Patient states she is trying to get stronger. No numbness or tingling. Legs are still weak, but arms have strengthened.    O: Current vital signs: BP (!) 103/45 (BP Location: Right Wrist)   Pulse (!) 110   Temp 98.3 F (36.8 C)   Resp 20   Ht 5\' 4"  (1.626 m)   Wt (!) 196.4 kg   LMP 12/09/2020   SpO2 94%   BMI 74.32 kg/m  Vital signs in last 24 hours: Temp:  [98.2 F (36.8 C)-99.4 F (37.4 C)] 98.3 F (36.8 C) (10/02 1358) Pulse Rate:  [100-110] 110 (10/02 1358) Resp:  [18-20] 20 (10/02 1358) BP: (103-135)/(45-88) 103/45 (10/02 1358) SpO2:  [94 %-100 %] 94 % (10/02 1358) Weight:  [196.4 kg] 196.4 kg (10/02 0500)  GENERAL: Fairly well, but not toxic appearing female who is awake, alert in NAD. HEENT: Normocephalic and atraumatic. LUNGS: Normal respiratory effort.  CV: RRR on tele.  Ext: warm. Psych: Affect slightly dull. Appears discouraged.   NEURO:  Mental Status: Alert and oriented x4. Speech/Language: speech is without aphasia or dysarthria.  Naming, repetition, fluency, and comprehension intact.  Cranial Nerves:  05-20-1995. Sensation is symmetrical to face. Smile is symmetrical. Hearing intact to voice.  Motor:  RUE: grip 4      bicep 4      tricep 4, she has 4-/5 wrist extension LUE: same as right.  BLEs: able to lift legs off bed about 3 inches. Plantar and dorsiflexion 4/5.  Tone: is normal and bulk is increased.  Sensation- Intact to light touch bilaterally. RLE light touch is positive from toes to mid tibia. Ascending touch is more dull above tibia. LLE light touch is intact and same from toes to upper thigh.  Coordination: FTN ataxic bilaterally. Unable to lift legs for HKS.  No drift in UEs.  DTRs:  0 throughout.  Medications  Current Facility-Administered Medications:    0.9 %  sodium chloride infusion, , Intravenous, PRN, Control and instrumentation engineer, NP   acetaminophen (TYLENOL) tablet 650 mg, 650 mg, Oral, Q6H PRN **OR**  acetaminophen (TYLENOL) suppository 650 mg, 650 mg, Rectal, Q6H PRN, Luiz Iron, MD   acetaminophen (TYLENOL) tablet 650 mg, 650 mg, Oral, Daily, Toberman, Stevi W, NP, 650 mg at 12/24/20 1940   bisacodyl (DULCOLAX) suppository 10 mg, 10 mg, Rectal, Once, 02/23/21, MD   busPIRone (BUSPAR) tablet 5 mg, 5 mg, Oral, q1800, Cinderella, Margaret A, 5 mg at 12/24/20 1725   ciprofloxacin (CILOXAN) 0.3 % ophthalmic solution 2 drop, 2 drop, Right Eye, Q4H while awake, 02/23/21, MD, 2 drop at 12/25/20 1354   cyanocobalamin ((VITAMIN B-12)) injection 1,000 mcg, 1,000 mcg, Intramuscular, Daily, Rai, Ripudeep K, MD, 1,000 mcg at 12/25/20 1030   diphenhydrAMINE (BENADRYL) 12.5 MG/5ML elixir 25 mg, 25 mg, Oral, Q6H PRN, Rai, Ripudeep K, MD, 25 mg at 12/21/20 2230   diphenhydrAMINE (BENADRYL) injection 25 mg, 25 mg, Intravenous, Daily, 2231, Stevi W, NP, 25 mg at 12/24/20 1941   [START ON 12/26/2020] folic acid (FOLVITE) tablet 1 mg, 1 mg, Oral, Daily, Wofford, Drew A, RPH   gabapentin (NEURONTIN) 250 MG/5ML solution 100 mg, 100 mg, Oral, Q8H, Rai, Ripudeep K, MD, 100 mg at 12/25/20 1354   HYDROmorphone (DILAUDID) injection 0.5 mg, 0.5 mg, Intravenous, Q4H PRN, 02/24/21, MD, 0.5 mg at 12/18/20 0916   Immune Globulin 10% (PRIVIGEN) IV infusion 80 g, 400 mg/kg, Intravenous, Q24 Hr x 5, Shatha Hooser, [place]  P, MD, Last Rate: 120 mL/hr at 12/24/20 2038, Rate Change at 12/24/20 2059   lidocaine (PF) (XYLOCAINE) 1 % injection 20 mL, 20 mL, Intradermal, Once, Rejeana Brock, MD   magic mouthwash w/lidocaine, 5 mL, Oral, TID AC, Rai, Ripudeep K, MD, 5 mL at 12/25/20 1200   menthol-cetylpyridinium (CEPACOL) lozenge 3 mg, 1 lozenge, Oral, PRN, Blount, Andi Devon T, NP, 3 mg at 12/14/20 1855   metoprolol tartrate (LOPRESSOR) injection 10 mg, 10 mg, Intravenous, Q6H, Rodolph Bong, MD, 10 mg at 12/25/20 1125   multivitamin liquid 15 mL, 15 mL, Oral, Daily, Rejeana Brock,  MD, 15 mL at 12/25/20 1030   ondansetron (ZOFRAN) tablet 8 mg, 8 mg, Oral, Q6H PRN, 8 mg at 12/13/20 0648 **OR** ondansetron (ZOFRAN) 8 mg in sodium chloride 0.9 % 50 mL IVPB, 8 mg, Intravenous, Q6H PRN, Bobette Mo, MD, Last Rate: 216 mL/hr at 12/15/20 1750, 8 mg at 12/15/20 1750   oxyCODONE (ROXICODONE) 5 MG/5ML solution 5 mg, 5 mg, Oral, Q4H PRN, Rai, Ripudeep K, MD, 5 mg at 12/25/20 1353   pantoprazole (PROTONIX) EC tablet 40 mg, 40 mg, Oral, BID, Wofford, Drew A, RPH   phenol (CHLORASEPTIC) mouth spray 1 spray, 1 spray, Mouth/Throat, PRN, Rai, Ripudeep K, MD   polyethylene glycol (MIRALAX / GLYCOLAX) packet 17 g, 17 g, Oral, Daily, Albertine Grates, MD, 17 g at 12/22/20 1018   prochlorperazine (COMPAZINE) injection 10 mg, 10 mg, Intravenous, Q8H, Rodolph Bong, MD, 10 mg at 12/25/20 1354   senna-docusate (Senokot-S) tablet 1 tablet, 1 tablet, Oral, BID, Rodolph Bong, MD, 1 tablet at 12/24/20 0947   sodium chloride flush (NS) 0.9 % injection 10-40 mL, 10-40 mL, Intracatheter, Q12H, Rai, Ripudeep K, MD, 10 mL at 12/25/20 1128   sodium chloride flush (NS) 0.9 % injection 10-40 mL, 10-40 mL, Intracatheter, PRN, Rai, Delene Ruffini, MD  Labs reviewed.   No new Imaging.  Assessment:  30 year old female with diffuse hypoesthesia, areflexia, ataxia some distal weakness most consistent with an acute neuropathy. 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 IVIG x 5 days ordered, this is last day.   Impression: -neuropathy, most consistent with GBS.  -paraesthesias improved.  -NIF/VC stable.  Recommendations/Plan:  -complete IVIG.  -continue NIF/VC q 12 hours.  -Continue B12 and Folate replacement.  -PT/OT recommending SNF.  Pt seen by Jimmye Norman, MSN, APN-BC/Nurse Practitioner/Neuro and later by MD. Note and plan to be edited as needed by MD.  Pager: [phone]   I have  seen the patient reviewed the above note.  She had progressive paresthesia and weakness with areflexia and albuminocytological disassociation consistent with Guillain-Barr syndrome.  She will complete her course of IVIG tonight.  Following this, care will be purely supportive including physical therapy and Occupational Therapy.  As of right now, there is no evidence for aggressive salvage therapies such as plasma exchange after IVIG, and rescue IVIG has not been shown to improve outcomes either.  Therefore, care will be focused on physical and occupational therapy from this point forward.  I would also minimize other possible confounders such as B12 deficiency and folate deficiency.  Would continue a multivitamin, but I think we can back off of the daily B12 shots at this point.  I have changed her cyanocobalamin to q. weekly for four doses, she will need monthly B12 shots after that.  I will defer to internal medicine on folate repletion.  Given that care is purely supportive from this point forward, neurology will be available on an as-needed basis only.  Please call with further questions or concerns.   Ritta Slot, MD [facility] [phone]  If 7pm- 7am, please page neurology on call as listed in [facility]ION.

## 2020-12-25 NOTE — Progress Notes (Signed)
NIF -40+ FVC 1.9L

## 2020-12-25 NOTE — Progress Notes (Signed)
NIF -40 VC 1.5

## 2020-12-25 NOTE — Evaluation (Signed)
Speech Language Pathology Evaluation Patient Details Name: Carla Little MRN: [id number] DOB: 1990-07-09 Today's Date: 12/25/2020 Time: 0900-0920 SLP Time Calculation (min) (ACUTE ONLY): 20 min  Problem List:  Patient Active Problem List   Diagnosis Date Noted   E. coli UTI    Intractable vomiting with nausea 12/09/2020   PUD (peptic ulcer disease) 12/09/2020   Nonalcoholic steatohepatitis (NASH) 12/09/2020   Class 3 obesity (HCC) 12/09/2020   Sinus tachycardia 12/09/2020   Hypokalemia 12/09/2020   GERD (gastroesophageal reflux disease)    Depression    Iron deficiency anemia due to chronic blood loss    Pressure injury of skin 11/21/2020   Left knee dislocation 11/01/2020   Knee dislocation, left, initial encounter 11/01/2020   Past Medical History:  Past Medical History:  Diagnosis Date   Class 3 obesity 12/09/2020   Depression    GERD (gastroesophageal reflux disease)    Nonalcoholic steatohepatitis (NASH) 12/09/2020   Obesity    PUD (peptic ulcer disease) 12/09/2020   Past Surgical History:  Past Surgical History:  Procedure Laterality Date   BIOPSY  11/19/2020   Procedure: BIOPSY;  Surgeon: Charna Elizabeth, MD;  Location: [facility];  Service: Endoscopy;;   ESOPHAGOGASTRODUODENOSCOPY (EGD) WITH PROPOFOL N/A 11/19/2020   Procedure: ESOPHAGOGASTRODUODENOSCOPY (EGD) WITH PROPOFOL;  Surgeon: Charna Elizabeth, MD;  Location: [facility];  Service: Endoscopy;  Laterality: N/A;   HPI:  Patient is a 30 year old female with morbid obesity, nonalcoholic fatty liver disease, GERD, depression, recently was hospitalized following a mechanical fall with a left knee dislocation, resultant MCL strain, ACL and PCL rupture then complicated by nausea vomiting, tachycardia, UTI and fecal impaction during the hospitalization. Progressive weakness during this hospitalization. Lumbar puncture was pursued which does show an albuminocytologic dissociation making this presentation most consistent  with Guillain-Barr syndrome.   Assessment / Plan / Recommendation Clinical Impression  Pt presents with a mild dysarthria due to R sided weakness. Oral mech exam reveals reduced strength, ROM, and sensation on R. She c/o slurred speech and feeling she can't get her tongue to "work right." This evaluation revealed dysarthria c/b imprecise consonants, specifically fricatives and affricates. Despite this imprecision, she remained fully intelligible. She feels her speech fluctuates and that last night people had trouble understanding her. SLP discussed speech intelligibility strategies of speaking slowly, loudly, overarticulating, pausing between words. She verbalized understanding. Given etiology of deficits, suspect speech to worsen before it gets better. SLP service to follow closely for intervention as indicated.  Cognition was briefly screened and found to be WNL (orientation, problem solving, short term memory all normal).    SLP Assessment  SLP Recommendation/Assessment: Patient needs continued Speech Lanaguage Pathology Services SLP Visit Diagnosis: Dysphagia, oral phase (R13.11);Dysarthria and anarthria (R47.1)    Recommendations for follow up therapy are one component of a multi-disciplinary discharge planning process, led by the attending physician.  Recommendations may be updated based on patient status, additional functional criteria and insurance authorization.    Follow Up Recommendations       Frequency and Duration    2 weeks      SLP Evaluation Cognition  Overall Cognitive Status: Within Functional Limits for tasks assessed Arousal/Alertness: Awake/alert Orientation Level: Oriented X4       Comprehension  Auditory Comprehension Overall Auditory Comprehension: Appears within functional limits for tasks assessed    Expression Expression Primary Mode of Expression: Verbal Verbal Expression Overall Verbal Expression: Appears within functional limits for tasks assessed    Oral / Motor  Oral Motor/Sensory Function  Overall Oral Motor/Sensory Function: Moderate impairment Facial ROM: Reduced right Facial Symmetry: Abnormal symmetry right Facial Strength: Reduced right Facial Sensation: Reduced right Lingual ROM: Reduced right Lingual Symmetry: Abnormal symmetry right Lingual Strength: Reduced Lingual Sensation: Reduced Velum: Impaired right Mandible: Impaired Motor Speech Overall Motor Speech: Impaired Respiration: Impaired Level of Impairment: Sentence Phonation: Low vocal intensity Resonance: Within functional limits Articulation: Impaired Level of Impairment: Word Intelligibility: Intelligible Motor Speech Errors: Aware Effective Techniques: Over-articulate;Pause;Increased vocal intensity;Slow rate              Blayne Frankie P. Leiam Hopwood, M.S., CCC-SLP Speech-Language Pathologist Acute Rehabilitation Services Pager: [phone]         Susanne Borders Stein [place] 12/25/2020, 10:02 AM

## 2020-12-25 NOTE — Progress Notes (Signed)
PROGRESS NOTE    Carla Little  QAS:[id number] DOB: 12-Jul-1990 DOA: 12/09/2020 PCP: Patient, No Pcp Per (Inactive)    Chief Complaint  Patient presents with   Emesis   Nausea    Brief Narrative:  Patient is a 30 year old female with morbid obesity, nonalcoholic fatty liver disease, GERD, depression, recently was hospitalized following a mechanical fall with a left knee dislocation, resultant MCL strain, ACL and PCL rupture then complicated by nausea vomiting, tachycardia, UTI and fecal impaction during the hospitalization. Patient had an endoscopy which noted small antral ulcer on 8/27, RUQ ultrasound was unremarkable.  Patient presented to ED with multiple episodes of nausea, vomiting, aches and pains everywhere. In ED, afebrile, mildly tachycardic, hypertensive.   Subjective:  No further issues with IVIG infusion with premedication No overnight event,  No new complaints this am, remain weak, denies pain, denies bowel and bladder incontinence  No n/v  Assessment & Plan:   Principal Problem:   Intractable vomiting with nausea Active Problems:   Left knee dislocation   PUD (peptic ulcer disease)   Nonalcoholic steatohepatitis (NASH)   Class 3 obesity (HCC)   Sinus tachycardia   Hypokalemia   GERD (gastroesophageal reflux disease)   Iron deficiency anemia due to chronic blood loss   E. coli UTI  Intractable nausea vomiting --Seen by GI during recent hospitalization and this hospitalization, s/p EGD ( on 8/29), HIDA scan on 9/23, CT angio abdomen pelvis on 9/16 -Thought possibly due to gastroparesis, esophagitis, opioid use -Was on IV Ancef Flagyl, Reglan, currently on nystatin/lidocaine Magic mouthwash, iv dilfucan x7 days started by previous attending  -GI recommend minimize narcotic use, gi signed off on 9/26 -appear improving  Paresthesias/weakness -Status post lumbar puncture suspected GBS -IVIG  per neurology  Hypokalemia Improved after supplement, monitor    B12 deficiency, folate deficiency -Started on B12 and folic acid supplement   E. coli UTI -Urine culture showed more than 100,000 colonies of E. coli and 20,000 colonies of Proteus mirabilis -IV Ancef and Flagyl discontinued   Recent left knee dislocation -Followed by orthopedics, appreciate recommendations by Dr. Magnus Ivan  -Recommended continue brace for another 2 to 4 weeks  -PT recommending SNF   Sinus tachycardia -Continue beta-blocker IV q6hrs, plan to transition to oral soon   Hypothyroidism -TSH 5.2, free T4 1.3   GERD -Continue PPI   Iron deficiency anemia -History of heavy menorrhagia on her cycle, required prbc transfusion during recent hospitalization  -reports continue to have irregular menses, monitor H&H   History of depression She is seen by psychiatry on 9/26, she is deemed low risk of self harm, she is continued on buspirone 5mg  with dinner   Morbid obesity: Body mass index is 74.32 kg/m.     Skin Assessment:  Reason for Consult: intertriginous dermatitis, irritant contact dermatitis at skin folds: inframammary, subpannicular and bilateral inguinal. Stage 2 to sacrum Seen by wound care on 9/18  I have examined the patient's skin and I agree with the wound assessment as performed by the wound care RN as outlined below:  Pressure Injury 11/19/20 Buttocks Right Stage 2 -  Partial thickness loss of dermis presenting as a shallow open injury with a red, pink wound bed without slough. (Active)  11/19/20 2100  Location: Buttocks  Location Orientation: Right  Staging: Stage 2 -  Partial thickness loss of dermis presenting as a shallow open injury with a red, pink wound bed without slough.  Wound Description (Comments):   Present on Admission:  Unresulted Labs (From admission, onward)     Start     Ordered   12/23/20 1509  Basic metabolic panel  Every Mon-Wed-Fri,   R     Question:  Specimen collection method  Answer:  Lab=Lab collect   12/23/20  1509   12/23/20 1508  CBC  Every Mon-Wed-Fri,   R     Question:  Specimen collection method  Answer:  Lab=Lab collect   12/23/20 1508   12/14/20 0500  CBC with Differential/Platelet  Tomorrow morning,   R        12/13/20 1713              DVT prophylaxis: Place and maintain sequential compression device Start: 12/24/20 1554 SCDs Start: 12/09/20 1937   Code Status: Full Family Communication: Patient Disposition:   Status is: Inpatient   Dispo: The patient is from: Home              Anticipated d/c is to: SNF              Anticipated d/c date is: need to finish IVIG, monitor hgb, possible d/c mid week                Consultants:  Neurology Psychiatry GI Wound care  Procedures:  Lp by IR on 9/28  Antimicrobials:    Anti-infectives (From admission, onward)    Start     Dose/Rate Route Frequency Ordered Stop   12/17/20 1100  fluconazole (DIFLUCAN) IVPB 100 mg        100 mg 50 mL/hr over 60 Minutes Intravenous Daily 12/17/20 1046 12/23/20 1125   12/13/20 2200  metroNIDAZOLE (FLAGYL) IVPB 500 mg  Status:  Discontinued        500 mg 100 mL/hr over 60 Minutes Intravenous Every 8 hours 12/13/20 2107 12/16/20 1620   12/13/20 1000  ceFAZolin (ANCEF) IVPB 2g/100 mL premix  Status:  Discontinued        2 g 200 mL/hr over 30 Minutes Intravenous Every 8 hours 12/13/20 0927 12/16/20 1721   12/12/20 1000  cefTRIAXone (ROCEPHIN) 2 g in sodium chloride 0.9 % 100 mL IVPB  Status:  Discontinued        2 g 200 mL/hr over 30 Minutes Intravenous Every 24 hours 12/12/20 0901 12/13/20 0928   12/11/20 0845  cefTRIAXone (ROCEPHIN) 1 g in sodium chloride 0.9 % 100 mL IVPB  Status:  Discontinued        1 g 200 mL/hr over 30 Minutes Intravenous Every 24 hours 12/11/20 0758 12/12/20 0901          Objective: Vitals:   12/25/20 0020 12/25/20 0500 12/25/20 0522 12/25/20 1358  BP: 135/88  122/78 (!) 103/45  Pulse: (!) 110  (!) 110 (!) 110  Resp: 18  20 20   Temp: 99 F (37.2 C)   98.4 F (36.9 C) 98.3 F (36.8 C)  TempSrc: Oral     SpO2: 100%  98% 94%  Weight:  (!) 196.4 kg    Height:       No intake or output data in the 24 hours ending 12/25/20 2030  Filed Weights   12/21/20 0352 12/22/20 0427 12/25/20 0500  Weight: (!) 201.9 kg (!) 199.6 kg (!) 196.4 kg    Examination:  General exam: calm, NAD, obese Respiratory system: Clear to auscultation. Respiratory effort normal. Cardiovascular system: Sinus tachycardia. Gastrointestinal system: Abdomen is nondistended, soft and nontender. . Normal bowel sounds heard. Central nervous system: Alert and oriented. No  focal neurological deficits. Extremities: Barely able to lift legs against gravity,  Skin: No rashes, lesions or ulcers Psychiatry: Judgement and insight appear normal. Mood & affect appropriate.     Data Reviewed: I have personally reviewed following labs and imaging studies  CBC: Recent Labs  Lab 12/21/20 0602 12/22/20 0442 12/23/20 1826 12/24/20 0643  WBC 9.5 10.4 7.4 6.6  NEUTROABS  --  6.0  --   --   HGB 8.4* 8.4* 8.3* 7.8*  HCT 26.9* 26.9* 26.7* 25.5*  MCV 100.4* 103.1* 104.3* 105.4*  PLT 188 261 250 262    Basic Metabolic Panel: Recent Labs  Lab 12/22/20 0442 12/23/20 0524 12/23/20 0836 12/23/20 1826 12/24/20 0643 12/25/20 0545  NA 136 138  --  137 134* 135  K 3.3* 5.7* 3.8 3.8 4.1 5.1  CL 98 97*  --  100 99 100  CO2 28 28  --  31 30 29   GLUCOSE 99 91  --  105* 97 95  BUN 9 12  --  11 10 9   CREATININE 0.37* 0.48  --  0.38* <0.30* <0.30*  CALCIUM 8.9 9.1  --  8.8* 8.3* 8.8*  MG 2.0 2.0  --   --   --   --     GFR: CrCl cannot be calculated (This lab value cannot be used to calculate CrCl because it is not a number: <0.30).  Liver Function Tests: Recent Labs  Lab 12/21/20 0602  AST 246*  ALT 85*  ALKPHOS 65  BILITOT 3.7*  PROT 6.6  ALBUMIN 2.9*    CBG: Recent Labs  Lab 12/21/20 2238  GLUCAP 110*     Recent Results (from the past 240 hour(s))  CSF  culture w Gram Stain     Status: None   Collection Time: 12/21/20 12:48 PM   Specimen: PATH Cytology CSF; Cerebrospinal Fluid  Result Value Ref Range Status   Specimen Description   Final    CSF Performed at [facility], [address] [place], [place]    Special Requests   Final    NONE Performed at [facility], [address] [place], [place]    Gram Stain   Final    WBC PRESENT,BOTH PMN AND MONONUCLEAR NO ORGANISMS SEEN Gram Stain Report Called to,Read Back By and Verified With: C.[place], RN AT 1418 ON 09.29.22 BY N.THOMPSON Performed at [facility], [address] [place], [place]    Culture   Final    NO GROWTH 3 DAYS Performed at [facility], [address], [place], [address] [place]    Report Status 12/24/2020 FINAL  Final         Radiology Studies: DG CHEST PORT 1 VIEW  Result Date: 12/24/2020 CLINICAL DATA:  Hypoxia. EXAM: PORTABLE CHEST 1 VIEW COMPARISON:  12/11/2020 FINDINGS: Stable elevation of the RIGHT hemidiaphragm. Heart size is accentuated by technique. No focal consolidations or pleural effusions. IMPRESSION: Elevation of the RIGHT hemidiaphragm, stable. Electronically Signed   By: 02/23/2021 M.D.   On: 12/24/2020 18:17        Scheduled Meds:  acetaminophen  650 mg Oral Daily   bisacodyl  10 mg Rectal Once   busPIRone  5 mg Oral q1800   ciprofloxacin  2 drop Right Eye Q4H while awake   [START ON 01/01/2021] cyanocobalamin  1,000 mcg Intramuscular Weekly   diphenhydrAMINE  25 mg Intravenous Daily   [START ON 12/26/2020] folic acid  1 mg Oral Daily   gabapentin  100 mg Oral Q8H   lidocaine (PF)  20 mL Intradermal Once   magic mouthwash w/lidocaine  5 mL Oral TID AC   metoprolol tartrate  10 mg Intravenous Q6H   multivitamin  15 mL Oral Daily   pantoprazole  40 mg Oral BID   polyethylene glycol  17 g Oral Daily   prochlorperazine  10 mg  Intravenous Q8H   senna-docusate  1 tablet Oral BID   sodium chloride flush  10-40 mL Intracatheter Q12H   Continuous Infusions:  sodium chloride     Immune Globulin 10% 120 mL/hr at 12/24/20 2038   ondansetron (ZOFRAN) IV 8 mg (12/15/20 1750)     LOS: 15 days   Time spent: Greater than 50% of this time was spent in counseling, explanation of diagnosis, planning of further management, and coordination of care.   Voice Recognition Reubin Milan dictation system was used to create this note, attempts have been made to correct errors. Please contact the author with questions and/or clarifications.   Albertine Grates, MD PhD FACP [facility]  Available via Epic secure chat 7am-7pm for nonurgent issues Please page for urgent issues To page the attending provider between 7A-7P or the covering provider during after hours 7P-7A, please log into the web site [email or web address] and access using universal [place] password for that web site. If you do not have the password, please call the hospital operator.    12/25/2020, 8:30 PM

## 2020-12-26 DIAGNOSIS — R112 Nausea with vomiting, unspecified: Secondary | ICD-10-CM

## 2020-12-26 DIAGNOSIS — S83105A Unspecified dislocation of left knee, initial encounter: Secondary | ICD-10-CM

## 2020-12-26 DIAGNOSIS — K279 Peptic ulcer, site unspecified, unspecified as acute or chronic, without hemorrhage or perforation: Secondary | ICD-10-CM

## 2020-12-26 LAB — CBC
HCT: 26.9 % — ABNORMAL LOW (ref 36.0–46.0)
Hemoglobin: 7.9 g/dL — ABNORMAL LOW (ref 12.0–15.0)
MCH: 31.7 pg (ref 26.0–34.0)
MCHC: 29.4 g/dL — ABNORMAL LOW (ref 30.0–36.0)
MCV: 108 fL — ABNORMAL HIGH (ref 80.0–100.0)
Platelets: 399 10*3/uL (ref 150–400)
RBC: 2.49 MIL/uL — ABNORMAL LOW (ref 3.87–5.11)
RDW: 25.6 % — ABNORMAL HIGH (ref 11.5–15.5)
WBC: 6.1 10*3/uL (ref 4.0–10.5)
nRBC: 0.3 % — ABNORMAL HIGH (ref 0.0–0.2)

## 2020-12-26 LAB — BASIC METABOLIC PANEL
Anion gap: 5 (ref 5–15)
BUN: 8 mg/dL (ref 6–20)
CO2: 30 mmol/L (ref 22–32)
Calcium: 8.9 mg/dL (ref 8.9–10.3)
Chloride: 103 mmol/L (ref 98–111)
Creatinine, Ser: 0.4 mg/dL — ABNORMAL LOW (ref 0.44–1.00)
GFR, Estimated: >60 mL/min (ref >60)
Glucose, Bld: 87 mg/dL (ref 70–99)
Potassium: 3.9 mmol/L (ref 3.5–5.1)
Sodium: 138 mmol/L (ref 135–145)

## 2020-12-26 MED ORDER — BUSPIRONE HCL 5 MG PO TABS
5.0000 mg | ORAL_TABLET | Freq: Two times a day (BID) | ORAL | Status: DC
  Administered 2020-12-26 – 2021-01-26 (×61): 5 mg via ORAL
  Filled 2020-12-26 (×62): qty 1

## 2020-12-26 MED ORDER — ONDANSETRON 4 MG PO TBDP: 4.0000 mg | ORAL_TABLET | Freq: Three times a day (TID) | ORAL | Status: DC | PRN

## 2020-12-26 NOTE — Consult Note (Addendum)
I have reviewed the note by NP Starkes-Perry, and discussed the plan of care.  I am in agreement with the assessment and plan. I made some minor edits to note to reflect Guillain-Barre diagnosis. Pt amenable to and would benefit from IOP after dc.   Young Berry Laylia Mui         [facility] Face-to-Face Psychiatry Consult   Reason for Consult: Multiple complaints, has paresthesia from B12/folate deficiency appeared to have a psychosomatic component to her complaints (later dx with guillain-barre), states her arms feels weak and not able to grasp restraints 4 to 5 out of 5 on neuro exam. Referring Physician:  DR. Tyson Babinski  Patient Identification: Carla Little MRN:  [id number] Principal Diagnosis: Intractable vomiting with nausea Diagnosis:  Principal Problem:   Intractable vomiting with nausea Active Problems:   Left knee dislocation   PUD (peptic ulcer disease)   Nonalcoholic steatohepatitis (NASH)   Class 3 obesity (HCC)   Sinus tachycardia   Hypokalemia   GERD (gastroesophageal reflux disease)   Iron deficiency anemia due to chronic blood loss   E. coli UTI   Total Time spent with patient: 30 minutes  Subjective:   Carla Little is a 30 y.o. female patient admitted with nausea and vomiting, myalgia and worsening b generalized pain.  Psychiatry was consulted for presenting symptoms that appear to be consistent with psychosomatic illness and or conversion disorder.  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 Patient is seen and reassessed by this nurse practitioner, she continues to report depressed mood and anxiety.  She reports compliance with BuSpar, and notes that it has been helping reduce some of her anxiety although she continues to have some depression symptoms.  She states her depression is manageable in an outpatient setting, and is not interested in receiving any additional psychiatric  services during this hospitalization.  She does appear to be future oriented and motivated, to seek therapy once discharged from the hospital.  She reports due to polypharmacy (additionally prior poor response to SSRI), she would like to refrain from initiation of any additional psychotropic medications at this time.  Patient denies any suicidal ideations, homicidal ideations, and no auditory or visual hallucinations.  She does appear to be psychiatrically stable from an inpatient standpoint.  As previously noted by colleague, psychosomatic illnesses and or conversion disorder, are best managed in an outpatient setting.  We will continue to recommend intensive outpatient after discharge, with BuSpar.  HPI:  Patient is a 30 year old female with morbid obesity, nonalcoholic fatty liver disease, GERD, depression, recently was hospitalized following a mechanical fall with a left knee dislocation, resultant MCL strain, ACL and PCL rupture then complicated by nausea vomiting, tachycardia, UTI and fecal impaction during the hospitalization. Patient had an endoscopy which noted small antral ulcer on 8/27, RUQ ultrasound was unremarkable.  Patient presented to ED with multiple episodes of nausea, vomiting, aches and pains everywhere. In ED, afebrile, mildly tachycardic, hypertensive.   Past Psychiatric History: History of depression and suicidal ideations, last admitted to OV in 2020. She denies any actual suicide attempts. She currently has no outpatient psychiatric providers, but is interested in services. Denies any self harm behaviors.   Risk to Self: Denies Risk to Others:  Denies Prior Inpatient Therapy:   [facility] 2020, for suicidal ideations and depression Prior Outpatient Therapy:    Past Medical History:  Past Medical History:  Diagnosis Date   Class 3 obesity 12/09/2020  Depression    GERD (gastroesophageal reflux disease)    Nonalcoholic steatohepatitis (NASH) 12/09/2020   Obesity    PUD  (peptic ulcer disease) 12/09/2020    Past Surgical History:  Procedure Laterality Date   BIOPSY  11/19/2020   Procedure: BIOPSY;  Surgeon: Charna Elizabeth, MD;  Location: [facility];  Service: Endoscopy;;   ESOPHAGOGASTRODUODENOSCOPY (EGD) WITH PROPOFOL N/A 11/19/2020   Procedure: ESOPHAGOGASTRODUODENOSCOPY (EGD) WITH PROPOFOL;  Surgeon: Charna Elizabeth, MD;  Location: [facility];  Service: Endoscopy;  Laterality: N/A;   Family History:  Family History  Problem Relation Age of Onset   Hypertension Other    Family Psychiatric  History: Denies Social History:  Social History   Substance and Sexual Activity  Alcohol Use Yes     Social History   Substance and Sexual Activity  Drug Use No    Social History   Socioeconomic History   Marital status: Single    Spouse name: Not on file   Number of children: Not on file   Years of education: Not on file   Highest education level: Not on file  Occupational History   Not on file  Tobacco Use   Smoking status: Never   Smokeless tobacco: Never  Substance and Sexual Activity   Alcohol use: Yes   Drug use: No   Sexual activity: Not on file  Other Topics Concern   Not on file  Social History Narrative   Not on file   Social Determinants of Health   Financial Resource Strain: Not on file  Food Insecurity: Not on file  Transportation Needs: Not on file  Physical Activity: Not on file  Stress: Not on file  Social Connections: Not on file   Additional Social History:    Allergies:   Allergies  Allergen Reactions   Azithromycin Shortness Of Breath and Nausea And Vomiting    Labs:  Results for orders placed or performed during the hospital encounter of 12/09/20 (from the past 48 hour(s))  Basic metabolic panel     Status: Abnormal   Collection Time: 12/25/20  5:45 AM  Result Value Ref Range   Sodium 135 135 - 145 mmol/L   Potassium 5.1 3.5 - 5.1 mmol/L    Comment: DELTA CHECK NOTED SPECIMEN HEMOLYZED. HEMOLYSIS MAY  AFFECT INTEGRITY OF RESULTS.    Chloride 100 98 - 111 mmol/L   CO2 29 22 - 32 mmol/L   Glucose, Bld 95 70 - 99 mg/dL    Comment: Glucose reference range applies only to samples taken after fasting for at least 8 hours.   BUN 9 6 - 20 mg/dL   Creatinine, Ser <2.77 (L) 0.44 - 1.00 mg/dL   Calcium 8.8 (L) 8.9 - 10.3 mg/dL   GFR, Estimated NOT CALCULATED >60 mL/min    Comment: (NOTE) Calculated using the CKD-EPI Creatinine Equation (2021)    Anion gap 6 5 - 15    Comment: Performed at [facility], [address] [place], [place] [address]    Current Facility-Administered Medications  Medication Dose Route Frequency Provider Last Rate Last Admin   0.9 %  sodium chloride infusion   Intravenous PRN Luiz Iron, NP       acetaminophen (TYLENOL) tablet 650 mg  650 mg Oral Q6H PRN Bobette Mo, MD   650 mg at 12/26/20 1101   Or   acetaminophen (TYLENOL) suppository 650 mg  650 mg Rectal Q6H PRN Bobette Mo, MD  bisacodyl (DULCOLAX) suppository 10 mg  10 mg Rectal Once Rodolph Bong, MD       busPIRone (BUSPAR) tablet 5 mg  5 mg Oral q1800 Lesieli Bresee A   5 mg at 12/25/20 1700   ciprofloxacin (CILOXAN) 0.3 % ophthalmic solution 2 drop  2 drop Right Eye Q4H while awake Bobette Mo, MD   2 drop at 12/26/20 1008   [START ON 01/01/2021] cyanocobalamin ((VITAMIN B-12)) injection 1,000 mcg  1,000 mcg Intramuscular Weekly Rejeana Brock, MD       diphenhydrAMINE (BENADRYL) 12.5 MG/5ML elixir 25 mg  25 mg Oral Q6H PRN Rai, Ripudeep K, MD   25 mg at 12/21/20 2230   folic acid (FOLVITE) tablet 1 mg  1 mg Oral Daily Danford Bad, RPH   1 mg at 12/26/20 1007   gabapentin (NEURONTIN) 250 MG/5ML solution 100 mg  100 mg Oral Q8H Rai, Ripudeep K, MD   100 mg at 12/26/20 1610   HYDROmorphone (DILAUDID) injection 0.5 mg  0.5 mg Intravenous Q4H PRN Zannie Cove, MD   0.5 mg at 12/18/20 0916   lidocaine (PF) (XYLOCAINE) 1 % injection 20  mL  20 mL Intradermal Once Rejeana Brock, MD       magic mouthwash w/lidocaine  5 mL Oral TID AC Rai, Ripudeep K, MD   5 mL at 12/26/20 1007   menthol-cetylpyridinium (CEPACOL) lozenge 3 mg  1 lozenge Oral PRN Audrea Muscat T, NP   3 mg at 12/14/20 1855   metoprolol tartrate (LOPRESSOR) injection 10 mg  10 mg Intravenous Q6H Rodolph Bong, MD   10 mg at 12/26/20 0641   multivitamin liquid 15 mL  15 mL Oral Daily Rejeana Brock, MD   15 mL at 12/26/20 1008   ondansetron (ZOFRAN) tablet 8 mg  8 mg Oral Q6H PRN Bobette Mo, MD   8 mg at 12/13/20 9604   Or   ondansetron (ZOFRAN) 8 mg in sodium chloride 0.9 % 50 mL IVPB  8 mg Intravenous Q6H PRN Bobette Mo, MD 216 mL/hr at 12/15/20 1750 8 mg at 12/15/20 1750   oxyCODONE (ROXICODONE) 5 MG/5ML solution 5 mg  5 mg Oral Q4H PRN Rai, Ripudeep K, MD   5 mg at 12/26/20 1014   pantoprazole (PROTONIX) EC tablet 40 mg  40 mg Oral BID Danford Bad, RPH   40 mg at 12/26/20 1006   phenol (CHLORASEPTIC) mouth spray 1 spray  1 spray Mouth/Throat PRN Rai, Ripudeep K, MD       polyethylene glycol (MIRALAX / GLYCOLAX) packet 17 g  17 g Oral Daily Albertine Grates, MD   17 g at 12/22/20 1018   prochlorperazine (COMPAZINE) injection 10 mg  10 mg Intravenous Q8H Rodolph Bong, MD   10 mg at 12/26/20 5409   senna-docusate (Senokot-S) tablet 1 tablet  1 tablet Oral BID Rodolph Bong, MD   1 tablet at 12/25/20 2220   sodium chloride flush (NS) 0.9 % injection 10-40 mL  10-40 mL Intracatheter Q12H Rai, Ripudeep K, MD   10 mL at 12/25/20 1128   sodium chloride flush (NS) 0.9 % injection 10-40 mL  10-40 mL Intracatheter PRN Rai, Delene Ruffini, MD        Musculoskeletal: Strength & Muscle Tone: within normal limits Gait & Station: normal Patient leans: N/A            Psychiatric Specialty Exam:  Presentation  General Appearance: Appropriate for Environment;  Casual  Eye Contact:Fair  Speech:Clear and Coherent; Normal  Rate  Speech Volume:Normal  Handedness:Right   Mood and Affect  Mood:Depressed  Affect:Congruent; Appropriate   Thought Process  Thought Processes:Coherent; Goal Directed  Descriptions of Associations:Intact  Orientation:Full (Time, Place and Person)  Thought Content:Logical  History of Schizophrenia/Schizoaffective disorder:No data recorded Duration of Psychotic Symptoms:No data recorded Hallucinations:Hallucinations: None  Ideas of Reference:None  Suicidal Thoughts:Suicidal Thoughts: No  Homicidal Thoughts:Homicidal Thoughts: No   Sensorium  Memory:Recent Good; Immediate Good; Remote Good  Judgment:Fair  Insight:Fair   Executive Functions  Concentration:Fair  Attention Span:Fair  Recall:Fair  Fund of Knowledge:Fair  Language:Fair   Psychomotor Activity  Psychomotor Activity:Psychomotor Activity: Normal   Assets  Assets:Financial Resources/Insurance; Desire for Improvement; Communication Skills; Leisure Time; Resilience; Social Support; Talents/Skills   Sleep  Sleep:Sleep: Fair   Physical Exam: Physical Exam Vitals and nursing note reviewed.  Constitutional:      General: She is awake.     Appearance: Normal appearance. She is morbidly obese.  HENT:     Head: Normocephalic.  Neurological:     General: No focal deficit present.     Mental Status: She is alert and oriented to person, place, and time. Mental status is at baseline.  Psychiatric:        Mood and Affect: Mood is depressed.        Behavior: Behavior normal. Behavior is cooperative.        Thought Content: Thought content normal.        Judgment: Judgment normal.   Review of Systems  Musculoskeletal:  Positive for joint pain, myalgias and neck pain.  Neurological:  Positive for weakness.  Psychiatric/Behavioral:  Positive for depression. Negative for hallucinations, memory loss, substance abuse and suicidal ideas. The patient is nervous/anxious. The patient does not have  insomnia.   All other systems reviewed and are negative. Blood pressure 109/60, pulse (!) 102, temperature 99.3 F (37.4 C), temperature source Oral, resp. rate 20, height 5\' 4"  (1.626 m), weight (!) 197.3 kg, last menstrual period 12/09/2020, SpO2 97 %. Body mass index is 74.67 kg/m.  Treatment Plan Summary: Plan we will increase BuSpar from 5 mg once a day, to 5 mg p.o. twice daily as patient does report some improvement in her anxiety since administration of this medication. -Patient will be a great candidate for intensive outpatient, once medically stable. -Consult for inpatient rehab has been placed, patient will also receive some neuropsychological services if accepted to [facility].   -Psychiatry to sign off at this time.  Patient does not need to be followed by the psychiatric consult service at this time, unable to further offer any additional treatment services or modalities during inpatient hospitalization for psychosomatic illness.  Disposition: No evidence of imminent risk to self or others at present.   Patient does not meet criteria for psychiatric inpatient admission. Supportive therapy provided about ongoing stressors. Refer to IOP.  12/11/2020, FNP 12/26/2020 12:09 PM

## 2020-12-26 NOTE — Progress Notes (Signed)
NIF -30  VC 1.8L 

## 2020-12-26 NOTE — Progress Notes (Signed)
NIF -40+ x 2 FVC 1.9L

## 2020-12-26 NOTE — Progress Notes (Signed)
   12/25/20 2106  Assess: MEWS Score  Temp 98.6 F (37 C)  BP 119/73  Pulse Rate (!) 122  Resp 16  SpO2 95 %  O2 Device Room Air  Assess: MEWS Score  MEWS Temp 0  MEWS Systolic 0  MEWS Pulse 2  MEWS RR 0  MEWS LOC 0  MEWS Score 2  MEWS Score Color Yellow  Assess: if the MEWS score is Yellow or Red  Were vital signs taken at a resting state? Yes  Focused Assessment No change from prior assessment  Does the patient meet 2 or more of the SIRS criteria? No  Does the patient have a confirmed or suspected source of infection? No  Provider and Rapid Response Notified? No  MEWS guidelines implemented *See Row Information* Yes  Treat  MEWS Interventions Administered scheduled meds/treatments  Pain Scale 0-10  Pain Score 9  Escalate  MEWS: Escalate Yellow: discuss with charge nurse/RN and consider discussing with provider and RRT  Notify: Charge Nurse/RN  Name of Charge Nurse/RN Notified tom,rn  Date Charge Nurse/RN Notified 12/25/20  Time Charge Nurse/RN Notified 2110  Notify: Provider  Date Provider Notified 12/25/20  Time Provider Notified 0007  Notification Type Page  Notification Reason Critical result  Notify: Rapid Response  Name of Rapid Response RN Notified  (no, hospitalist notified, asymptomatic)  Document  Patient Outcome Other (Comment) (cont to monitor)  Assess: SIRS CRITERIA  SIRS Temperature  0  SIRS Pulse 1  SIRS Respirations  0  SIRS WBC 0  SIRS Score Sum  1  Pt asymptomatic pt trend tachy

## 2020-12-26 NOTE — TOC Progression Note (Signed)
Transition of Care [facility]) - Progression Note    Patient Details  Name: INAAYA VELLUCCI MRN: [id number] Date of Birth: 16-Jul-1990  Transition of Care [facility]) CM/SW Contact  Ida Rog[place], [place] Phone Number: 12/26/2020, 4:27 PM  Clinical Narrative:   Contacted Financial Navigator re insurance status, who confirmed pending Medicaid application with DSS caseworker [facility] [phone].  Spoke with my supervisor, who confirmed that we can send patient out to facility with letter of guarantee since she is MCD pending.  Bed search initiated. TOC will continue to follow during the course of hospitalization.     Expected Discharge Plan: Skilled Nursing Facility Barriers to Discharge: SNF Pending bed offer  Expected Discharge Plan and Services Expected Discharge Plan: Skilled Nursing Facility                                               Social Determinants of Health (SDOH) Interventions    Readmission Risk Interventions No flowsheet data found.

## 2020-12-26 NOTE — Progress Notes (Signed)
   12/25/20 2106 12/25/20 2311  Assess: MEWS Score  Temp 98.6 F (37 C) 98.5 F (36.9 C)  BP 119/73 124/77  Pulse Rate (!) 122 (!) 114  Resp 16 16  SpO2  --  98 %  O2 Device  --  Nasal Cannula  O2 Flow Rate (L/min)  --  1 L/min  Assess: MEWS Score  MEWS Temp 0 0  MEWS Systolic 0 0  MEWS Pulse 2 2  MEWS RR 0 0  MEWS LOC 0 0  MEWS Score 2 2  MEWS Score Color Yellow Yellow  Assess: if the MEWS score is Yellow or Red  Were vital signs taken at a resting state?  --  Yes  Focused Assessment  --  No change from prior assessment  Does the patient meet 2 or more of the SIRS criteria?  --  No  Does the patient have a confirmed or suspected source of infection?  --  No  Provider and Rapid Response Notified?  --  No  MEWS guidelines implemented *See Row Information*  --  No, previously yellow, continue vital signs every 4 hours  Treat  MEWS Interventions  --  Administered prn meds/treatments  Pain Scale  --  0-10  Pain Score  --  7  Pain Type  --  Chronic pain  Escalate  MEWS: Escalate  --  Yellow: discuss with charge nurse/RN and consider discussing with provider and RRT  Notify: Charge Nurse/RN  Name of Charge Nurse/RN Notified  --  Tom, RN  Date Charge Nurse/RN Notified  --  12/25/20  Time Charge Nurse/RN Notified  --  2320  Notify: Provider  Date Provider Notified  --  12/25/20  Time Provider Notified  --  0007  Notification Type  --  Page  Notification Reason  --  Critical result  Notify: Rapid Response  Name of Rapid Response RN Notified  --   (not notified)  Document  Patient Outcome  --  Other (Comment) (continue to moniter)  Assess: SIRS CRITERIA  SIRS Temperature  0 0  SIRS Pulse 1 1  SIRS Respirations  0 0  SIRS WBC 0 0  SIRS Score Sum  1 1  See previous note pt stable no new orders yet.

## 2020-12-26 NOTE — Progress Notes (Addendum)
Inpatient Rehabilitation Admissions Coordinator   We will sign off at this time. No caregiver supports after a CIR admit and therefore will need SNF. Will not get to independent level to return home after a short rehab stay. I will alert acute team and Judeen Hammans, SW  Ottie Glazier, RN, MSN Rehab Admissions Coordinator [phone] 12/26/2020 2:33 PM

## 2020-12-26 NOTE — Progress Notes (Addendum)
PROGRESS NOTE    Carla Little  ZOX:[id number] DOB: 1990-05-02 DOA: 12/09/2020 PCP: Patient, No Pcp Per (Inactive)    Brief Narrative:  Patient is a 30 year old female with history of super morbid obesity, nonalcoholic fatty liver disease, GERD, depression, mechanical fall with left knee dislocation with resultant MCL strain ACL and PCL rupture presented to hospital this time with complaints of nausea vomiting tachycardia and UTI and fecal impaction.  Patient did have an endoscopy with small antral ulcer on 11/19/2020.  Right upper quadrant ultrasound was unremarkable.  In the ED patient was afebrile mildly tachycardic and hypertensive.  Patient was then admitted hospital for further evaluation and treatment.  Assessment & Plan:   Principal Problem:   Intractable vomiting with nausea Active Problems:   Left knee dislocation   PUD (peptic ulcer disease)   Nonalcoholic steatohepatitis (NASH)   Class 3 obesity (HCC)   Sinus tachycardia   Hypokalemia   GERD (gastroesophageal reflux disease)   Iron deficiency anemia due to chronic blood loss   E. coli UTI  Intractable nausea and vomiting Improved at this time.  Has been seen by GI during hospitalization.  Status post EGD on 11/21/2020, HIDA scan on 9/23, CT angio abdomen pelvis on 12/09/20 thought to be secondary to gastroparesis, esophagitis and opiate use. continue nystatin lidocaine Magic mouthwash and has completed Diflucan.  Paresthesias/extremity weakness -Status post lumbar puncture suspected GBS, completed IVIG course as per neurology.  Hypokalemia Improved after replacement.  B12 deficiency, folate deficiency Continue B12 and folic acid   E. coli UTI Completed treatment.   Recent left knee dislocation Was seen by orthopedic Dr. Magnus Ivan who recommended brace for 2 to 4 weeks.  Physical therapy recommends a skilled nursing facility placement.    Sinus tachycardia Mild.  On IV beta-blocker.  Will resume oral  beta-blocker at this time   Hypothyroidism -TSH 5.2, free T4 1.3   GERD Continue Protonix   Iron deficiency anemia Secondary to heavy menorrhagia.  Had recently received PRBC during this hospitalization for anemia.  We will continue to monitor.  Latest hemoglobin of 7.8.  History of depression Seen by psychiatry.  Continue buspirone    Super morbid obesity: Body mass index is 74.67 kg/m.Marland Kitchen  Patient would benefit from weight loss as outpatient.  Pressure injury present on admission.  Continue wound care. Pressure Injury 11/19/20 Buttocks Right Stage 2 -  Partial thickness loss of dermis presenting as a shallow open injury with a red, pink wound bed without slough. (Active)  11/19/20 2100  Location: Buttocks  Location Orientation: Right  Staging: Stage 2 -  Partial thickness loss of dermis presenting as a shallow open injury with a red, pink wound bed without slough.  Wound Description (Comments):   Present on Admission:     DVT prophylaxis: Place and maintain sequential compression device Start: 12/24/20 1554 SCDs Start: 12/09/20 1937   Code Status: Full  Family Communication:  None.  Disposition:   Status is: Inpatient  Dispo: The patient is from: Home              Anticipated d/c is to: SNF               Consultants:  Neurology Psychiatry GI Wound care  Procedures:  Lumbar puncture by IR on 12/21/20  Antimicrobials:   None at this time  Subjective: Today, patient was seen and examined at bedside.  Patient complains about generalized weakness.  Complains of bilateral lower extremity numbness.  Has had bowel  movements.  Objective: Vitals:   12/26/20 0149 12/26/20 0444 12/26/20 0500 12/26/20 0947  BP: 126/78 120/81  109/60  Pulse: 92 100  (!) 102  Resp: 16 14  20   Temp: 98.9 F (37.2 C) 97.8 F (36.6 C)  99.3 F (37.4 C)  TempSrc: Oral Oral  Oral  SpO2: 100% 100%  97%  Weight:   (!) 197.3 kg   Height:       No intake or output data in the 24 hours  ending 12/26/20 1303  Filed Weights   12/22/20 0427 12/25/20 0500 12/26/20 0500  Weight: (!) 199.6 kg (!) 196.4 kg (!) 197.3 kg    Physical examination: General: Morbidly obese, not in obvious distress, on nasal cannula oxygen HENT:   No scleral pallor or icterus noted. Oral mucosa is moist.  Chest:  Clear breath sounds.  Diminished breath sounds bilaterally. No crackles or wheezes.  CVS: S1 &S2 heard. No murmur.  Regular rate and rhythm.  Sinus tachycardia. Abdomen: Soft, nontender, nondistended.  Bowel sounds are heard.   Extremities: No cyanosis, clubbing or edema.  Peripheral pulses are palpable.  Moves all extremities. Psych: Alert, awake and oriented, normal mood CNS:  No cranial nerve deficits.  Generalized weakness noted of the extremities. Skin: Warm and dry.  No rashes noted.  Data Reviewed: I have personally reviewed the following labs and imaging studies.    CBC: Recent Labs  Lab 12/21/20 0602 12/22/20 0442 12/23/20 1826 12/24/20 0643  WBC 9.5 10.4 7.4 6.6  NEUTROABS  --  6.0  --   --   HGB 8.4* 8.4* 8.3* 7.8*  HCT 26.9* 26.9* 26.7* 25.5*  MCV 100.4* 103.1* 104.3* 105.4*  PLT 188 261 250 262     Basic Metabolic Panel: Recent Labs  Lab 12/22/20 0442 12/23/20 0524 12/23/20 0836 12/23/20 1826 12/24/20 0643 12/25/20 0545  NA 136 138  --  137 134* 135  K 3.3* 5.7* 3.8 3.8 4.1 5.1  CL 98 97*  --  100 99 100  CO2 28 28  --  31 30 29   GLUCOSE 99 91  --  105* 97 95  BUN 9 12  --  11 10 9   CREATININE 0.37* 0.48  --  0.38* <0.30* <0.30*  CALCIUM 8.9 9.1  --  8.8* 8.3* 8.8*  MG 2.0 2.0  --   --   --   --      GFR: CrCl cannot be calculated (This lab value cannot be used to calculate CrCl because it is not a number: <0.30).  Liver Function Tests: Recent Labs  Lab 12/21/20 0602  AST 246*  ALT 85*  ALKPHOS 65  BILITOT 3.7*  PROT 6.6  ALBUMIN 2.9*     CBG: Recent Labs  Lab 12/21/20 2238  GLUCAP 110*      Recent Results (from the past 240  hour(s))  CSF culture w Gram Stain     Status: None   Collection Time: 12/21/20 12:48 PM   Specimen: PATH Cytology CSF; Cerebrospinal Fluid  Result Value Ref Range Status   Specimen Description   Final    CSF Performed at [facility], [address] [place], [place]    Special Requests   Final    NONE Performed at [facility], [address] [place], [place]    Gram Stain   Final    WBC PRESENT,BOTH PMN AND MONONUCLEAR NO ORGANISMS SEEN Gram Stain Report Called to,Read Back By  and Verified With: C.Earnestine Leys, RN AT 1418 ON 09.29.22 BY N.THOMPSON Performed at [facility], [address], [place], [place] [address]    Culture   Final    NO GROWTH 3 DAYS Performed at [facility], [address], [place], [place] [address]    Report Status 12/24/2020 FINAL  Final     Radiology Studies: DG CHEST PORT 1 VIEW  Result Date: 12/24/2020 CLINICAL DATA:  Hypoxia. EXAM: PORTABLE CHEST 1 VIEW COMPARISON:  12/11/2020 FINDINGS: Stable elevation of the RIGHT hemidiaphragm. Heart size is accentuated by technique. No focal consolidations or pleural effusions. IMPRESSION: Elevation of the RIGHT hemidiaphragm, stable. Electronically Signed   By: Norva Pavlov M.D.   On: 12/24/2020 18:17    Scheduled Meds:  bisacodyl  10 mg Rectal Once   busPIRone  5 mg Oral BID   ciprofloxacin  2 drop Right Eye Q4H while awake   [START ON 01/01/2021] cyanocobalamin  1,000 mcg Intramuscular Weekly   folic acid  1 mg Oral Daily   gabapentin  100 mg Oral Q8H   lidocaine (PF)  20 mL Intradermal Once   magic mouthwash w/lidocaine  5 mL Oral TID AC   metoprolol tartrate  10 mg Intravenous Q6H   multivitamin  15 mL Oral Daily   pantoprazole  40 mg Oral BID   polyethylene glycol  17 g Oral Daily   prochlorperazine  10 mg Intravenous Q8H   senna-docusate  1 tablet Oral BID   sodium chloride flush  10-40 mL Intracatheter Q12H    Continuous Infusions:  sodium chloride     ondansetron (ZOFRAN) IV 8 mg (12/15/20 1750)     LOS: 16 days    Joycelyn Das, MD  [facility] 12/26/2020, 1:03 PM

## 2020-12-26 NOTE — Progress Notes (Signed)
Physical Therapy Treatment Patient Details Name: Carla Little MRN: [id number] DOB: 04/07/1990 Today's Date: 12/26/2020   History of Present Illness Patient is a 30 y.o. female who presented to [facility] for intractable N/V on 9/17. ED labwork reveals e. coli UTI. Pt had recent hospital admission from 8/8-9/5 due to Lt knee dislocation which was reduced with fractures of the proximal fibula ligamentous avulsion laterally as well as medially off the medial femoral condyle and MRI showed complete ACL & PCL tears and MCL strain. That hospital admission was complicated by nausea, vomiting, tachycardia, UTI, and fecal impaction;. PMH significant for morbid obesity, GERD, fatty liver disease, depression. Patient now with guillan barre.    PT Comments    The patient presents with significant edema about the  extremities and trunk. Patient  continues with RUE weaker than left, noted difficulty using  iphone, drops it. Practiced use of bed controls with difficulty pressing down on device .  Patient lifted to Kreg tilt bed via sky lift. Placed in semi seated chair position to work on sitting upright. Patint does not have trunk strength to  lean forward, support self.  Continue strengthening and  seated activities and  begin tilting for  weight bearing activities with controlled bed features.  Recommendations for follow up therapy are one component of a multi-disciplinary discharge planning process, led by the attending physician.  Recommendations may be updated based on patient status, additional functional criteria and insurance authorization.  Follow Up Recommendations  SNF     Equipment Recommendations  Wheelchair cushion (measurements PT);Wheelchair (measurements PT)    Recommendations for Other Services       Precautions / Restrictions Precautions Precautions: Fall Precaution Comments: PT Orders from Maureen Ralphs, MD; "WBAT Left LE, only up with a walker, does not need brace at this standpoint."  Also in his note says if probalems with mobility - use brace again. Required Braces or Orthoses: Knee Immobilizer - Left     Mobility  Bed Mobility   Bed Mobility: Rolling Rolling: +2 for physical assistance;Max assist         General bed mobility comments: patient able to reach better with LUE to facility rolling, does  flex legas a little to facilitate roll. Rolling x 2 to each side for lift pad to be placed and removed.    Transfers                    Ambulation/Gait                 Stairs             Wheelchair Mobility    Modified Rankin (Stroke Patients Only)       Balance       Sitting balance - Comments: patient placed in Kreg tilt bed. Placed in sitting position of bed. Attempted gripping rails and  leaning forward. Patient unable to lift trunk. patient is able to reposition self in bed for side to side when slides. Tends to list to the right more, weaker  U and LE on right.                                    Cognition Arousal/Alertness: Awake/alert Behavior During Therapy: Flat affect Overall Cognitive Status: Within Functional Limits for tasks assessed  General Comments: pt seems depressed, flat affect, now having trouble with BUE coordination and tingling in BUE/LEs      Exercises General Exercises - Upper Extremity Shoulder Flexion: AAROM;Supine;Both;10 reps Shoulder ABduction: AAROM;10 reps;Both Elbow Flexion: AAROM;10 reps;Both;Supine    General Comments        Pertinent Vitals/Pain Pain Assessment: Faces Faces Pain Scale: Hurts little more Pain Location: BUEs, L > R, legs when rollimg Pain Descriptors / Indicators: Discomfort;Grimacing;Guarding;Pins and needles;Sore;Spasm Pain Intervention(s): Limited activity within patient's tolerance;Premedicated before session    Home Living                      Prior Function            PT Goals (current  goals can now be found in the care plan section) Progress towards PT goals: Progressing toward goals    Frequency    Min 2X/week      PT Plan Current plan remains appropriate    Co-evaluation              AM-PAC PT "6 Clicks" Mobility   Outcome Measure  Help needed turning from your back to your side while in a flat bed without using bedrails?: Total Help needed moving from lying on your back to sitting on the side of a flat bed without using bedrails?: Total Help needed moving to and from a bed to a chair (including a wheelchair)?: Total Help needed standing up from a chair using your arms (e.g., wheelchair or bedside chair)?: Total Help needed to walk in hospital room?: Total Help needed climbing 3-5 steps with a railing? : Total 6 Click Score: 6    End of Session   Activity Tolerance: Patient limited by fatigue Patient left: in bed;with call bell/phone within reach Nurse Communication: Mobility status;Need for lift equipment PT Visit Diagnosis: Muscle weakness (generalized) (M62.81) Pain - Right/Left: Left Pain - part of body: Knee     Time: 4580-9983 PT Time Calculation (min) (ACUTE ONLY): 60 min  Charges:  $Therapeutic Exercise: 8-22 mins $Therapeutic Activity: 23-37 mins $Self Care/Home Management: 8-22                     Carla Little PT Acute Rehabilitation Services Pager [phone] Office [phone]    Rada Hay 12/26/2020, 2:05 PM

## 2020-12-26 NOTE — NC FL2 (Signed)
[place] MEDICAID FL2 LEVEL OF CARE SCREENING TOOL     IDENTIFICATION  Patient Name: Carla Little Birthdate: Jan 05, 1991 Sex: female Admission Date (Current Location): 12/09/2020  [facility] [place] Number:  Producer, television/film/video and Address:  [facility],  [address] [place] [address]      Provider Number: [id number]  Attending Physician Name and Address:  Joycelyn Das, MD  Relative Name and Phone Number:  Emmit Alexanders (Friend)   [phone]    Current Level of Care: Hospital Recommended Level of Care: Skilled Nursing Facility Prior Approval Number:    Date Approved/Denied:   PASRR Number: [id number]  Discharge Plan: SNF    Current Diagnoses: Patient Active Problem List   Diagnosis Date Noted   E. coli UTI    Intractable vomiting with nausea 12/09/2020   PUD (peptic ulcer disease) 12/09/2020   Nonalcoholic steatohepatitis (NASH) 12/09/2020   Class 3 obesity (HCC) 12/09/2020   Sinus tachycardia 12/09/2020   Hypokalemia 12/09/2020   GERD (gastroesophageal reflux disease)    Depression    Iron deficiency anemia due to chronic blood loss    Pressure injury of skin 11/21/2020   Left knee dislocation 11/01/2020   Knee dislocation, left, initial encounter 11/01/2020    Orientation RESPIRATION BLADDER Height & Weight     Self, Time, Situation, Place  Normal External catheter Weight: (!) 197.3 kg Height:  5\' 4"  (162.6 cm)  BEHAVIORAL SYMPTOMS/MOOD NEUROLOGICAL BOWEL NUTRITION STATUS      Incontinent Diet (see d/c summary)  AMBULATORY STATUS COMMUNICATION OF NEEDS Skin   Total Care Verbally Other (Comment) (Pressure injury, buttocks; Moisture Associated Skin Damage, Bilaterally under breasts, Vagina, Lower abdomen, left and right)                       Personal Care Assistance Level of Assistance  Total care       Total Care Assistance: Maximum assistance   Functional Limitations Info  Sight, Hearing, Speech Sight Info:  Adequate Hearing Info: Adequate Speech Info: Adequate    SPECIAL CARE FACTORS FREQUENCY  PT (By licensed PT), OT (By licensed OT)     PT Frequency: 5X/W OT Frequency: 5X/W            Contractures Contractures Info: Not present    Additional Factors Info  Code Status, Allergies Code Status Info: Full Allergies Info: Azithromycin           Current Medications (12/26/2020):  This is the current hospital active medication list Current Facility-Administered Medications  Medication Dose Route Frequency Provider Last Rate Last Admin   0.9 %  sodium chloride infusion   Intravenous PRN 02/25/2021, NP       acetaminophen (TYLENOL) tablet 650 mg  650 mg Oral Q6H PRN Luiz Iron, MD   650 mg at 12/26/20 1101   Or   acetaminophen (TYLENOL) suppository 650 mg  650 mg Rectal Q6H PRN 02/25/21, MD       bisacodyl (DULCOLAX) suppository 10 mg  10 mg Rectal Once Bobette Mo, MD       busPIRone (BUSPAR) tablet 5 mg  5 mg Oral BID Starkes-Perry, Rodolph Bong, FNP       ciprofloxacin (CILOXAN) 0.3 % ophthalmic solution 2 drop  2 drop Right Eye Q4H while awake Juel Burrow, MD   2 drop at 12/26/20 1242   [START ON 01/01/2021] cyanocobalamin ((VITAMIN B-12)) injection 1,000 mcg  1,000 mcg Intramuscular Weekly 03/03/2021,  Hardin Negus, MD       diphenhydrAMINE (BENADRYL) 12.5 MG/5ML elixir 25 mg  25 mg Oral Q6H PRN Rai, Ripudeep K, MD   25 mg at 12/21/20 2230   folic acid (FOLVITE) tablet 1 mg  1 mg Oral Daily Danford Bad, RPH   1 mg at 12/26/20 1007   gabapentin (NEURONTIN) 250 MG/5ML solution 100 mg  100 mg Oral Q8H Rai, Ripudeep K, MD   100 mg at 12/26/20 1251   HYDROmorphone (DILAUDID) injection 0.5 mg  0.5 mg Intravenous Q4H PRN Zannie Cove, MD   0.5 mg at 12/18/20 0916   lidocaine (PF) (XYLOCAINE) 1 % injection 20 mL  20 mL Intradermal Once Rejeana Brock, MD       magic mouthwash w/lidocaine  5 mL Oral TID AC Rai, Ripudeep K, MD   5 mL at  12/26/20 1242   menthol-cetylpyridinium (CEPACOL) lozenge 3 mg  1 lozenge Oral PRN Audrea Muscat T, NP   3 mg at 12/14/20 1855   metoprolol tartrate (LOPRESSOR) injection 10 mg  10 mg Intravenous Q6H Rodolph Bong, MD   10 mg at 12/26/20 1242   multivitamin liquid 15 mL  15 mL Oral Daily Rejeana Brock, MD   15 mL at 12/26/20 1008   ondansetron (ZOFRAN) tablet 8 mg  8 mg Oral Q6H PRN Bobette Mo, MD   8 mg at 12/13/20 8563   Or   ondansetron (ZOFRAN) 8 mg in sodium chloride 0.9 % 50 mL IVPB  8 mg Intravenous Q6H PRN Bobette Mo, MD 216 mL/hr at 12/15/20 1750 8 mg at 12/15/20 1750   oxyCODONE (ROXICODONE) 5 MG/5ML solution 5 mg  5 mg Oral Q4H PRN Rai, Ripudeep K, MD   5 mg at 12/26/20 1014   pantoprazole (PROTONIX) EC tablet 40 mg  40 mg Oral BID Danford Bad, RPH   40 mg at 12/26/20 1006   phenol (CHLORASEPTIC) mouth spray 1 spray  1 spray Mouth/Throat PRN Rai, Ripudeep K, MD       polyethylene glycol (MIRALAX / GLYCOLAX) packet 17 g  17 g Oral Daily Albertine Grates, MD   17 g at 12/22/20 1018   prochlorperazine (COMPAZINE) injection 10 mg  10 mg Intravenous Q8H Rodolph Bong, MD   10 mg at 12/26/20 1251   senna-docusate (Senokot-S) tablet 1 tablet  1 tablet Oral BID Rodolph Bong, MD   1 tablet at 12/25/20 2220   sodium chloride flush (NS) 0.9 % injection 10-40 mL  10-40 mL Intracatheter Q12H Rai, Ripudeep K, MD   10 mL at 12/25/20 1128   sodium chloride flush (NS) 0.9 % injection 10-40 mL  10-40 mL Intracatheter PRN Rai, Delene Ruffini, MD         Discharge Medications: Please see discharge summary for a list of discharge medications.  Relevant Imaging Results:  Relevant Lab Results:   Additional Information [phone] [employer], [place]

## 2020-12-27 DIAGNOSIS — K2100 Gastro-esophageal reflux disease with esophagitis, without bleeding: Secondary | ICD-10-CM

## 2020-12-27 DIAGNOSIS — B9620 Unspecified Escherichia coli [E. coli] as the cause of diseases classified elsewhere: Secondary | ICD-10-CM

## 2020-12-27 DIAGNOSIS — K279 Peptic ulcer, site unspecified, unspecified as acute or chronic, without hemorrhage or perforation: Secondary | ICD-10-CM

## 2020-12-27 DIAGNOSIS — K7581 Nonalcoholic steatohepatitis (NASH): Secondary | ICD-10-CM

## 2020-12-27 DIAGNOSIS — S83105A Unspecified dislocation of left knee, initial encounter: Secondary | ICD-10-CM

## 2020-12-27 DIAGNOSIS — N390 Urinary tract infection, site not specified: Secondary | ICD-10-CM

## 2020-12-27 DIAGNOSIS — R000 Tachycardia, unspecified: Secondary | ICD-10-CM

## 2020-12-27 DIAGNOSIS — E876 Hypokalemia: Secondary | ICD-10-CM

## 2020-12-27 DIAGNOSIS — D500 Iron deficiency anemia secondary to blood loss (chronic): Secondary | ICD-10-CM

## 2020-12-27 MED ORDER — LIDOCAINE 5 % EX PTCH
1.0000 | MEDICATED_PATCH | Freq: Every day | CUTANEOUS | Status: DC
  Administered 2020-12-27 – 2021-01-26 (×30): 1 via TRANSDERMAL
  Filled 2020-12-27 (×31): qty 1

## 2020-12-27 NOTE — Progress Notes (Signed)
NIF -50, FVC 2.0

## 2020-12-27 NOTE — Progress Notes (Signed)
   12/27/20 1121  Assess: MEWS Score  Temp 98.2 F (36.8 C)  BP 129/70  Pulse Rate (!) 111  Resp 20  Level of Consciousness Alert  SpO2 99 %  O2 Device Nasal Cannula  O2 Flow Rate (L/min) 1 L/min  Assess: MEWS Score  MEWS Temp 0  MEWS Systolic 0  MEWS Pulse 2  MEWS RR 0  MEWS LOC 0  MEWS Score 2  MEWS Score Color Yellow  Assess: if the MEWS score is Yellow or Red  Were vital signs taken at a resting state? Yes  Focused Assessment Change from prior assessment (see assessment flowsheet)  Does the patient meet 2 or more of the SIRS criteria? Yes  Does the patient have a confirmed or suspected source of infection? No  Provider and Rapid Response Notified? No  MEWS guidelines implemented *See Row Information* Yes  Treat  MEWS Interventions Administered scheduled meds/treatments  Pain Scale 0-10  Pain Score 7  Pain Type Acute pain  Pain Location Back  Pain Frequency Constant  Pain Onset On-going  Patients Stated Pain Goal 2  Pain Intervention(s) Other (Comment) (k pad on her back)  Take Vital Signs  Increase Vital Sign Frequency  Yellow: Q 2hr X 2 then Q 4hr X 2, if remains yellow, continue Q 4hrs  Escalate  MEWS: Escalate Yellow: discuss with charge nurse/RN and consider discussing with provider and RRT  Notify: Charge Nurse/RN  Name of Charge Nurse/RN Notified Barbara,RN  Date Charge Nurse/RN Notified 12/27/20  Time Charge Nurse/RN Notified 1138  Notify: Provider  Provider Name/Title Pokhreal,MD  Date Provider Notified 12/27/20  Time Provider Notified 1138  Notification Type Page  Notification Reason Other (Comment) (Elevated HR)  Provider response No new orders  Date of Provider Response 12/27/20  Document  Patient Outcome Other (Comment) (Continue monitor the pt, no new order this time)  Assess: SIRS CRITERIA  SIRS Temperature  0  SIRS Pulse 1  SIRS Respirations  0  SIRS WBC 0  SIRS Score Sum  1  Pt is alert and oriented. Pt is on yellow MEWS started. Will  continue monitor the pt.

## 2020-12-27 NOTE — Progress Notes (Addendum)
Physical Therapy Treatment Patient Details Name: Carla Little MRN: [id number] DOB: [birth year] Today's Date: 12/27/2020   History of Present Illness Patient is a 30 y.o. female who presented to [facility] for intractable N/V on 9/17. ED labwork reveals e. coli UTI. Pt had recent hospital admission from 8/8-9/5 due to Lt knee dislocation which was reduced with fractures of the proximal fibula ligamentous avulsion laterally as well as medially off the medial femoral condyle and MRI showed complete ACL & PCL tears and MCL strain. That hospital admission was complicated by nausea, vomiting, tachycardia, UTI, and fecal impaction;. PMH significant for morbid obesity, GERD, fatty liver disease, depression. Patient now with guillan barre. Received IvIG.    PT Comments   Patient and bed prepped to tilt . Strps placed over legs and trunk. Patient right leg with decreased control with leg sliding to right bed edge. Repositioned x 3. L LE in External rotation. Patient able to internally rotate but not maintained more neutral position.  Bed tilted x 30* x 8 minutes. Noted Left ankle tending to invert  so stopped tilt and returned to supine.  Maxisky pad placed with 3 persons assisting with rolling  Lifted patient to recliner and positioned to patient comfort.   Plans to return to assist back into bed.  Recommendations for follow up therapy are one component of a multi-disciplinary discharge planning process, led by the attending physician.  Recommendations may be updated based on patient status, additional functional criteria and insurance authorization.  Follow Up Recommendations  SNF     Equipment Recommendations  Wheelchair cushion (measurements PT);Wheelchair (measurements PT)    Recommendations for Other Services       Precautions / Restrictions Precautions Precautions: Fall Precaution Comments: PT Orders from Maureen Ralphs, MD; "WBAT Left LE, only up with a walker, does not need brace at this  standpoint." Also in his note says if probalems with mobility - use brace again.     Mobility  Bed Mobility Overal bed mobility: Needs Assistance Bed Mobility: Rolling Rolling: +2 for physical assistance;Max assist        General bed mobility comments: patient able to reach better with LUE to facility rolling, does  flex legas a little to facilitate roll. Rolling x 2 to each side for lift pad to be placed Start Time: 1140 Angle: 30 degrees Total Minutes in Angle: 8 minutes  Transfers                    Ambulation/Gait                 Stairs             Wheelchair Mobility    Modified Rankin (Stroke Patients Only)       Balance       Sitting balance - Comments: able to shift  upper body to left when tilted                                    Cognition Arousal/Alertness: Awake/alert Behavior During Therapy: Flat affect Overall Cognitive Status: Within Functional Limits for tasks assessed                                 General Comments: pt seems depressed, flat affect, now having trouble with BUE coordination and tingling in BUE/LEs  Exercises      General Comments        Pertinent Vitals/Pain Pain Assessment: Faces Faces Pain Scale: Hurts even more Pain Location: all over when moving and rolling Pain Descriptors / Indicators: Discomfort;Grimacing;Guarding;Pins and needles;Sore;Spasm Pain Intervention(s): Monitored during session;Premedicated before session    Home Living                      Prior Function            PT Goals (current goals can now be found in the care plan section) Progress towards PT goals: Progressing toward goals    Frequency    Min 2X/week      PT Plan Current plan remains appropriate    Co-evaluation              AM-PAC PT "6 Clicks" Mobility   Outcome Measure  Help needed turning from your back to your side while in a flat bed without using  bedrails?: Total Help needed moving from lying on your back to sitting on the side of a flat bed without using bedrails?: Total Help needed moving to and from a bed to a chair (including a wheelchair)?: Total Help needed standing up from a chair using your arms (e.g., wheelchair or bedside chair)?: Total Help needed to walk in hospital room?: Total Help needed climbing 3-5 steps with a railing? : Total 6 Click Score: 6    End of Session   Activity Tolerance: Patient limited by fatigue Patient left: in bed;with call bell/phone within reach Nurse Communication: Mobility status;Need for lift equipment PT Visit Diagnosis: Muscle weakness (generalized) (M62.81) Pain - Right/Left: Left Pain - part of body: Knee     Time: 1130-1230 PT Time Calculation (min) (ACUTE ONLY): 60 min  Charges:  $Therapeutic Activity: 53-67 mins                     Blanchard Kelch PT Acute Rehabilitation Services Pager [phone] Office [phone]    Rada Hay 12/27/2020, 5:29 PM

## 2020-12-27 NOTE — Progress Notes (Addendum)
PROGRESS NOTE    Carla Little  GYI:[id number] DOB: 1990-06-16 DOA: 12/09/2020 PCP: Patient, No Pcp Per (Inactive)    Brief Narrative:   Patient is a 30 year old female with history of super morbid obesity, nonalcoholic fatty liver disease, GERD, depression, mechanical fall with left knee dislocation with resultant MCL strain ACL and PCL rupture presented to hospital this time with complaints of nausea vomiting tachycardia and UTI and fecal impaction.  Patient did have an endoscopy with small antral ulcer on 11/19/2020.  Right upper quadrant ultrasound was unremarkable.  In the ED patient was afebrile mildly tachycardic and hypertensive.  Patient was then admitted hospital for further evaluation and treatment.  Assessment & Plan:   Principal Problem:   Intractable vomiting with nausea Active Problems:   Left knee dislocation   PUD (peptic ulcer disease)   Nonalcoholic steatohepatitis (NASH)   Class 3 obesity (HCC)   Sinus tachycardia   Hypokalemia   GERD (gastroesophageal reflux disease)   Iron deficiency anemia due to chronic blood loss   E. coli UTI  Intractable nausea and vomiting Improved at this time.  Has been seen by GI during hospitalization.  Status post EGD on 11/21/2020, HIDA scan on 9/23, CT angio abdomen pelvis on 12/09/20 thought to be secondary to gastroparesis, esophagitis and opiate use. continue nystatin lidocaine Magic mouthwash. Patient has completed Diflucan.  States mild residual difficulty swallowing and episodic vomiting.  Paresthesias/extremity weakness Status post lumbar puncture suspected GBS, completed IVIG course as per neurology x 5 days.  Will need continued rehabilitation.  Hypokalemia Improved after replacement.  Latest potassium of 3.9.  B12 deficiency, folate deficiency Continue B12 and folic acid   E. coli UTI Completed treatment.   Recent left knee dislocation Was seen by orthopedic Dr. Magnus Ivan who recommended brace for 2 to 4 weeks.   Physical therapy recommends  skilled nursing facility placement.    Sinus tachycardia Mild.  On IV beta-blocker.  Will resume oral beta-blocker when oral intake is established.  Patient states that she still has some difficulty swallowing and episodes of vomiting at times.   Hypothyroidism -TSH 5.2, free T4 1.3.  Not on hormonal treatment.  Will need outpatient monitoring with TSH.   GERD Continue Protonix twice daily for now.   Iron deficiency anemia Secondary to heavy menorrhagia.  Had recently received PRBC during this hospitalization for anemia.  We will continue to monitor.  Latest hemoglobin of 7.8>7.9.  History of depression Seen by psychiatry.  Continue buspirone    Super morbid obesity: Body mass index is 86.28 kg/m.Marland Kitchen  Patient would benefit from weight loss as outpatient.  Back pain.  From prolonged immobility and suspected GBS.  On IV Dilaudid.  Encouraged to decrease.  We will add Lidoderm patch.  Add K pad as well.  Will need to be mobilized with physical therapy.  Pressure injury present on admission.  Continue wound care. Pressure Injury 11/19/20 Buttocks Right Stage 2 -  Partial thickness loss of dermis presenting as a shallow open injury with a red, pink wound bed without slough. (Active)  11/19/20 2100  Location: Buttocks  Location Orientation: Right  Staging: Stage 2 -  Partial thickness loss of dermis presenting as a shallow open injury with a red, pink wound bed without slough.  Wound Description (Comments):   Present on Admission:     DVT prophylaxis: Place and maintain sequential compression device Start: 12/24/20 1554 SCDs Start: 12/09/20 1937   Code Status:  Full code  Family Communication:  None.  Disposition:   Status is: Inpatient  Dispo: The patient is from: Home              Anticipated d/c is to: SNF               Consultants:  Neurology Psychiatry GI Wound care  Procedures:  Lumbar puncture by IR on 12/21/20 PRBC  transfusion.  Antimicrobials:   None at this time  Subjective: Today, patient was seen and examined at bedside.  Complains of feeling of passing out and back pain due to immobility.  Had a bowel movement yesterday.  Complains of back pain.  Objective: Vitals:   12/27/20 0612 12/27/20 0812 12/27/20 1115 12/27/20 1121  BP: (!) 103/58 129/83 109/75 129/70  Pulse: (!) 110 (!) 103 (!) 114 (!) 111  Resp:  20  20  Temp:  97.6 F (36.4 C)  98.2 F (36.8 C)  TempSrc:  Oral  Oral  SpO2: 98%  98% 99%  Weight:      Height:        Intake/Output Summary (Last 24 hours) at 12/27/2020 1155 Last data filed at 12/26/2020 2105 Gross per 24 hour  Intake 10 ml  Output --  Net 10 ml    Filed Weights   12/26/20 0500 12/26/20 1900 12/27/20 0500  Weight: (!) 197.3 kg (!) 228 kg (!) 228 kg    Physical examination: General: Morbidly obese, not in obvious distress, on nasal cannula oxygen HENT:   Mild pallor noted.  Oral mucosa is moist.  Chest:  Clear breath sounds.  Diminished breath sounds bilaterally. No crackles or wheezes.  CVS: S1 &S2 heard. No murmur.  Regular rate and rhythm.  Mild tachycardia. Abdomen: Soft, nontender, nondistended.  Obese abdomen.  Bowel sounds are heard.   Extremities: No cyanosis, clubbing or edema.  Peripheral pulses are palpable.  Moves all extremities. Psych: Alert, awake and oriented,  CNS:  No cranial nerve deficits.  Generalized weakness noted of the extremities but able to move.. Skin: Warm and dry.  No rashes noted.  Data Reviewed: I have personally reviewed the following labs and imaging studies.    CBC: Recent Labs  Lab 12/21/20 0602 12/22/20 0442 12/23/20 1826 12/24/20 0643 12/26/20 1536  WBC 9.5 10.4 7.4 6.6 6.1  NEUTROABS  --  6.0  --   --   --   HGB 8.4* 8.4* 8.3* 7.8* 7.9*  HCT 26.9* 26.9* 26.7* 25.5* 26.9*  MCV 100.4* 103.1* 104.3* 105.4* 108.0*  PLT 188 261 250 262 399     Basic Metabolic Panel: Recent Labs  Lab 12/22/20 0442  12/23/20 0524 12/23/20 0836 12/23/20 1826 12/24/20 0643 12/25/20 0545 12/26/20 1536  NA 136 138  --  137 134* 135 138  K 3.3* 5.7* 3.8 3.8 4.1 5.1 3.9  CL 98 97*  --  100 99 100 103  CO2 28 28  --  31 30 29 30   GLUCOSE 99 91  --  105* 97 95 87  BUN 9 12  --  11 10 9 8   CREATININE 0.37* 0.48  --  0.38* <0.30* <0.30* 0.40*  CALCIUM 8.9 9.1  --  8.8* 8.3* 8.8* 8.9  MG 2.0 2.0  --   --   --   --   --      GFR: Estimated Creatinine Clearance: 199.5 mL/min (A) (by C-G formula based on SCr of 0.4 mg/dL (L)).  Liver Function Tests: Recent Labs  Lab 12/21/20 0602  AST 246*  ALT 85*  ALKPHOS 65  BILITOT 3.7*  PROT 6.6  ALBUMIN 2.9*     CBG: Recent Labs  Lab 12/21/20 2238  GLUCAP 110*      Recent Results (from the past 240 hour(s))  CSF culture w Gram Stain     Status: None   Collection Time: 12/21/20 12:48 PM   Specimen: PATH Cytology CSF; Cerebrospinal Fluid  Result Value Ref Range Status   Specimen Description   Final    CSF Performed at [facility], [address], [place], [place] [address]    Special Requests   Final    NONE Performed at [facility], [address], [place], [place] [address]    Gram Stain   Final    WBC PRESENT,BOTH PMN AND MONONUCLEAR NO ORGANISMS SEEN Gram Stain Report Called to,Read Back By and Verified With: C.Earnestine Leys, RN AT 1418 ON 09.29.22 BY N.THOMPSON Performed at [facility], [address] [place], [place] [address]    Culture   Final    NO GROWTH 3 DAYS Performed at [facility], [address], [place], [place] [address]    Report Status 12/24/2020 FINAL  Final     Radiology Studies: No results found.  Scheduled Meds:  bisacodyl  10 mg Rectal Once   busPIRone  5 mg Oral BID   ciprofloxacin  2 drop Right Eye Q4H while awake   [START ON 01/01/2021] cyanocobalamin  1,000 mcg Intramuscular Weekly   folic acid  1 mg Oral Daily   gabapentin  100 mg Oral Q8H    lidocaine  1 patch Transdermal Daily   lidocaine (PF)  20 mL Intradermal Once   magic mouthwash w/lidocaine  5 mL Oral TID AC   metoprolol tartrate  10 mg Intravenous Q6H   multivitamin  15 mL Oral Daily   pantoprazole  40 mg Oral BID   polyethylene glycol  17 g Oral Daily   prochlorperazine  10 mg Intravenous Q8H   senna-docusate  1 tablet Oral BID   sodium chloride flush  10-40 mL Intracatheter Q12H   Continuous Infusions:  sodium chloride     ondansetron (ZOFRAN) IV 8 mg (12/15/20 1750)     LOS: 17 days    Joycelyn Das, MD  [facility] 12/27/2020, 11:55 AM

## 2020-12-27 NOTE — Progress Notes (Signed)
Physical Therapy Treatment Patient Details Name: Carla Little MRN: [id number] DOB: 1990-12-06 Today's Date: 12/27/2020   History of Present Illness Patient is a 30 y.o. female who presented to [facility] for intractable N/V on 9/17. ED labwork reveals e. coli UTI. Pt had recent hospital admission from 8/8-9/5 due to Lt knee dislocation which was reduced with fractures of the proximal fibula ligamentous avulsion laterally as well as medially off the medial femoral condyle and MRI showed complete ACL & PCL tears and MCL strain. That hospital admission was complicated by nausea, vomiting, tachycardia, UTI, and fecal impaction;. PMH significant for morbid obesity, GERD, fatty liver disease, depression. Patient now with guillan barre.    PT Comments    Patient tolerated OOB in recliner x ~ 1 hour. Maxisky lift for transfers, 2-3 assist to roll for reposition and bed changed.  Patient not comfortable in Kreg bed , talked about trying sitting upright in chair position tomorrow.  Unfortunately, LE's  are not taking weight when tilted earlier with risk for L ankle twisting.      Recommendations for follow up therapy are one component of a multi-disciplinary discharge planning process, led by the attending physician.  Recommendations may be updated based on patient status, additional functional criteria and insurance authorization.  Follow Up Recommendations  SNF     Equipment Recommendations  Wheelchair cushion (measurements PT);Wheelchair (measurements PT)    Recommendations for Other Services       Precautions / Restrictions Precautions Precautions: Fall Precaution Comments: PT Orders from Maureen Ralphs, MD; "WBAT Left LE, only up with a walker, does not need brace at this standpoint." Also in his note says if probalems with mobility - use brace again.     Mobility  Bed Mobility Overal bed mobility: Needs Assistance Bed Mobility: Rolling Rolling: +2 for physical assistance;Max /total  assist      General bed mobility comments: rolling to each side to remove lift pad, once initiated by staff. patient reaches for rail to assist. Patient unable to maintain hands on rail .   Transfers                    Ambulation/Gait                 Stairs             Wheelchair Mobility    Modified Rankin (Stroke Patients Only)       Balance       Sitting balance - Comments: able to shift  upper body to left when tilted                                    Cognition Arousal/Alertness: Awake/alert Behavior During Therapy: Flat affect Overall Cognitive Status: Within Functional Limits for tasks assessed                                 General Comments: pt seems depressed, flat affect, now having trouble with BUE coordination and tingling in BUE/LEs      Exercises      General Comments        Pertinent Vitals/Pain Pain Assessment: Faces Faces Pain Scale: Hurts even more Pain Location: all over when moving and rolling Pain Descriptors / Indicators: Discomfort;Grimacing;Guarding;Pins and needles;Sore;Spasm Pain Intervention(s): Monitored during session;Premedicated before session    Home Living  Prior Function            PT Goals (current goals can now be found in the care plan section) Progress towards PT goals: Progressing toward goals    Frequency    Min 2X/week      PT Plan Current plan remains appropriate    Co-evaluation              AM-PAC PT "6 Clicks" Mobility   Outcome Measure  Help needed turning from your back to your side while in a flat bed without using bedrails?: Total Help needed moving from lying on your back to sitting on the side of a flat bed without using bedrails?: Total Help needed moving to and from a bed to a chair (including a wheelchair)?: Total Help needed standing up from a chair using your arms (e.g., wheelchair or bedside  chair)?: Total Help needed to walk in hospital room?: Total Help needed climbing 3-5 steps with a railing? : Total 6 Click Score: 6    End of Session   Activity Tolerance: Patient limited by fatigue Patient left: in bed;with call bell/phone within reach Nurse Communication: Mobility status;Need for lift equipment PT Visit Diagnosis: Muscle weakness (generalized) (M62.81) Pain - Right/Left: Left Pain - part of body: Knee     Time: 1345-1420 PT Time Calculation (min) (ACUTE ONLY): 35 min  Charges:  $Therapeutic Activity: 23-37 mins                     Blanchard Kelch PT Acute Rehabilitation Services Pager [phone] Office [phone]    Rada Hay 12/27/2020, 5:37 PM

## 2020-12-27 NOTE — Progress Notes (Signed)
Speech Language Pathology Treatment: Dysphagia  Patient Details Name: Carla Little MRN: [id number] DOB: 1991/03/21 Today's Date: 12/27/2020 Time: 1252-1304 SLP Time Calculation (min) (ACUTE ONLY): 12 min  Assessment / Plan / Recommendation Clinical Impression  Pt continues to report issues with oral pocketing of foods - posterior - on right and left;  Tongue is midline and pt with adequate lingual/labial ROM without overt dysarthria and voice is clear but decreased phonatory strength- although she advised speech is worse than baseline.  Endorse labial numbness.    SLP encouraged pt to consume solids - using compensation strategies for oral clearance to prevent disuse atrophy using teach back.  Pt advised she would attempt but appears reticent.    She admits to decrease in appetitie and gustatory changes- becoming tearful during conversation.  SLP suspects pt's dysphagia is multifactorial - due to motivation from sadness with current health crisis, suboptimal positioning and dysphagia.  Will follow up for po trials of advanced textures to improve pt's po intake/nutrition. Inquired SLP willing to ask MD re: appetite stimulant, to which pt stated "No".      HPI HPI: Patient is a 30 year old female with morbid obesity, nonalcoholic fatty liver disease, GERD, depression, recently was hospitalized following a mechanical fall with a left knee dislocation, resultant MCL strain, ACL and PCL rupture then complicated by nausea vomiting, tachycardia, UTI and fecal impaction during the hospitalization. Progressive weakness during this hospitalization. 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      SLP Plan         Recommendations for follow up therapy are one component of a multi-disciplinary discharge planning process, led by the attending physician.  Recommendations may be updated based on  patient status, additional functional criteria and insurance authorization.    Recommendations  Diet recommendations: Dysphagia 3 (mechanical soft);Thin liquid Medication Administration: Whole meds with liquid Compensations: Follow solids with liquid;Lingual sweep for clearance of pocketing Postural Changes and/or Swallow Maneuvers: Seated upright 90 degrees;Upright 30-60 min after meal (chin forward at least, esophageal precautions)                Oral Care Recommendations: Oral care BID Follow up Recommendations: Inpatient Rehab SLP Visit Diagnosis: Dysphagia, oral phase (R13.11);Dysarthria and anarthria (R47.1)       GO              [name], MS [facility] SLP Acute Rehab Services Office [phone] Pager [phone]   [name]  12/27/2020, 4:47 PM

## 2020-12-27 NOTE — Progress Notes (Signed)
Chaplain engaged in an initial visit with Carla Little.  She shared about the depression she has been experiencing in being confined to a hospital bed with limited mobility.  She also talked about what it has meant for her to only be 29, turning 30 soon, and to be going through so much in her body and health.  Carla Little shared about her healthcare journey and finding out about what has been happening to her body.  She voiced, "I won't even be able to do anything for my 30nd birthday.  I'll be here."  Carla Little talked about how much her life has changed in such a sudden way.  She went from working from home and getting a position that she loves to being hospitalized.  The transition and changes in her life have been hard to deal with.    Carla Little did vocalize that she has dealt with depression before and took medicine for her depression.  She stated that it may be a good idea to revisit a conversation around taking medicine.  Chaplain checked in with her regarding being able to eat or watch television.  She voiced that she hasn't wanted to do either of those things.  She vocalized that she needs someone to feed her.  She also does keep the tv off because she hasn't wanted to watch anything.  Chaplain and Carla Little had a conversation about what sitting in a dark room can also do to the spirit and how it can contribute to depression as well.    Carla Little also revealed that she has limited family/community support.  Her family members live out of state and many of those she considers to be her friends have been too busy to come to see her.  She stated that her mom passed when she was 53 years old and that she does not have a relationship with her father.  Chaplain let her know that the Spiritual Care department would be offering her support daily and that Chaplain would put in a request for a Companion Sitter to visit with her as well.  Carla Little stated that she would like for someone to come see her daily.    Chaplain  assesses that Carla Little is lonely and has longed for someone or a community to journey with her during this time.  Chaplain also assesses that Carla Little desires to have her life back in some shape or form and that this new reality has been hard to come to terms with.  She desires to see a "cure" happen in some way.  Chaplains will continue to follow-up and offer support.    12/27/20 1700  Clinical Encounter Type  Visited With Patient  Visit Type Initial;Spiritual support  Referral From Nurse;Patient  Consult/Referral To Chaplain  Spiritual Encounters  Spiritual Needs Prayer;Emotional;Grief support  Multimedia programmer  Recommended  Hours needed 2 hours  Companion reason Emotional support

## 2020-12-27 NOTE — Progress Notes (Signed)
NIF = -50 cm H2O FVC= 2.1L

## 2020-12-27 NOTE — Progress Notes (Signed)
Pt wanted me to call family (cousin)and update her. Called the family and talked with her . she mentioned she wanted to talk to Child psychotherapist. Put her number in the board . Will continue monitor the pt.

## 2020-12-27 NOTE — Progress Notes (Signed)
Speech Language Pathology Treatment: Dysphagia  Patient Details Name: Carla Little MRN: [id number] DOB: 27-Aug-1990 Today's Date: 12/27/2020 Time: 1530-1601 SLP Time Calculation (min) (ACUTE ONLY): 31 min  Assessment / Plan / Recommendation Clinical Impression  SLP visit for dysphagia treatment for utility of compensation strategies with solid po.  SLP Provided pt with a honey bun due to its soft texture to encourage po.  Pt did not allow SLP to place [facility] in upright position stating she "was comfortable" but did lean her chin down to protect airway.   Strategies included starting po with liquids, placing food bolus in left oral cavity, using icecream or/and water to orally transit partially masticated solids and finally use of oral suctioning to clear secretions/retention using teach back and written instructions.   Pt demonstrates prolonged oral transiting and mastication, stating "it's stuck" referring to honey bun bolus. Use of icecream faciliated full clearance , despite pt continuing to sense retention.    After 2nd bolus, oral suctioning provided to pt - which she was able to use herself and clear small single piece of pastry and secretions.  Lingual sweep or finger sweep advised to help clear oral pocketing but pt reports she is unable to perform.    Gag x1 noted with deeper posterior suction but improved with pt placing suction in anterior position of oral cavity.  Pt advised she will "try" to consume solids - using strategies implemented today for oral clearance.  Liquid is most efficient means of nutrition for pt, however recommend to continue to encourages solids with strict precautions.    Pt tearful and SLP inquired if she would like to see chaplain - to which she confirmed - informed RN.  Suspect pt's sadness with her significant medical change is contributing to her poor intake.  She made progress today however with consumption of solids using compensations with max cues and  encouragement.      HPI HPI: Patient is a 30 year old female with morbid obesity, nonalcoholic fatty liver disease, GERD, depression, recently was hospitalized following a mechanical fall with a left knee dislocation, resultant MCL strain, ACL and PCL rupture then complicated by nausea vomiting, tachycardia, UTI and fecal impaction during the hospitalization. Progressive weakness during this hospitalization. 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      SLP Plan  Continue with current plan of care      Recommendations for follow up therapy are one component of a multi-disciplinary discharge planning process, led by the attending physician.  Recommendations may be updated based on patient status, additional functional criteria and insurance authorization.    Recommendations  Diet recommendations: Dysphagia 3 (mechanical soft);Thin liquid Medication Administration: Other (Comment) (suspension) Compensations: Follow solids with liquid;Lingual sweep for clearance of pocketing;Other (Comment) Postural Changes and/or Swallow Maneuvers: Seated upright 90 degrees;Upright 30-60 min after meal                Oral Care Recommendations: Oral care BID Follow up Recommendations: Inpatient Rehab SLP Visit Diagnosis: Dysphagia, oral phase (R13.11);Dysarthria and anarthria (R47.1) Plan: Continue with current plan of care       GO               Carla Infante, MS [facility] SLP Acute Rehab Services Office [phone] Pager [phone]  [name]  12/27/2020, 6:18 PM

## 2020-12-28 DIAGNOSIS — G610 Guillain-Barre syndrome: Principal | ICD-10-CM

## 2020-12-28 LAB — BASIC METABOLIC PANEL
Anion gap: 9 (ref 5–15)
BUN: 7 mg/dL (ref 6–20)
CO2: 30 mmol/L (ref 22–32)
Calcium: 8.7 mg/dL — ABNORMAL LOW (ref 8.9–10.3)
Chloride: 100 mmol/L (ref 98–111)
Creatinine, Ser: <0.3 mg/dL — ABNORMAL LOW (ref 0.44–1.00)
Glucose, Bld: 92 mg/dL (ref 70–99)
Potassium: 4.2 mmol/L (ref 3.5–5.1)
Sodium: 139 mmol/L (ref 135–145)

## 2020-12-28 LAB — CBC
HCT: 27.8 % — ABNORMAL LOW (ref 36.0–46.0)
Hemoglobin: 8.1 g/dL — ABNORMAL LOW (ref 12.0–15.0)
MCH: 31.2 pg (ref 26.0–34.0)
MCHC: 29.1 g/dL — ABNORMAL LOW (ref 30.0–36.0)
MCV: 106.9 fL — ABNORMAL HIGH (ref 80.0–100.0)
Platelets: 461 10*3/uL — ABNORMAL HIGH (ref 150–400)
RBC: 2.6 MIL/uL — ABNORMAL LOW (ref 3.87–5.11)
RDW: 24.6 % — ABNORMAL HIGH (ref 11.5–15.5)
WBC: 6.6 10*3/uL (ref 4.0–10.5)
nRBC: 0 % (ref 0.0–0.2)

## 2020-12-28 NOTE — Progress Notes (Signed)
Chaplain engaged in a follow-up visit with [place].  She expressed all the progress she made today which included sitting in the chair, eating, and deciding to watch movies.  She is actively trying to fight being in a depressed state by not just laying in the dark room.  [place] also spent some time talking about her past.  She loves watching old, black and white films, because of her grandparents and the aunt who helped raise her.  She was an only child surrounded by older adults and picked up on their love for older movies.    Santanna's aunt who was her guardian after her mom passed when she was 52, passed away when she was 30 years old.  She voiced that she took care of her aunt through her illness.  She expressed that her life has been lonely and she has had to journey through it by herself.    Avabella is worried at the moment about paying bills.  She needs to pay her phone bill and is thinking about how she will pay rent after November.  Chaplain recommended that [place] speak with social work about any resources.  She is also unaware about the status of her disability application.    Chaplain will continue to follow-up and offer support.  Chaplain offered prayer with her and will reach out to social work.    12/28/20 1600  Clinical Encounter Type  Visited With Patient  Visit Type Follow-up;Spiritual support  Spiritual Encounters  Spiritual Needs Prayer

## 2020-12-28 NOTE — Progress Notes (Signed)
Attempted to check-in on patient but she was sleeping.  Chaplain will check back in before 5:00 pm.    12/28/20 1400  Clinical Encounter Type  Visited With Patient  Visit Type Follow-up

## 2020-12-28 NOTE — Progress Notes (Signed)
Occupational Therapy Treatment Patient Details Name: Carla Little MRN: [id number] DOB: [birth year] Today's Date: 12/28/2020   History of present illness Patient is a 30 y.o. female who presented to [facility] for intractable N/V on 9/17. ED labwork reveals e. coli UTI. Pt had recent hospital admission from 8/8-9/5 due to Lt knee dislocation which was reduced with fractures of the proximal fibula ligamentous avulsion laterally as well as medially off the medial femoral condyle and MRI showed complete ACL & PCL tears and MCL strain. That hospital admission was complicated by nausea, vomiting, tachycardia, UTI, and fecal impaction;. PMH significant for morbid obesity, GERD, fatty liver disease, depression. Patient now with guillan barre.   OT comments  Patient participated in sitting in chair position on edge of bed with back off back of bed. Patient encouraged to use BUE on bed rails to participate in sitting upright. Patient was able to participate in brushing teeth with max A using LUE with patient noted to fatigue quickly. Patient was able to use younker to clear mouth with increased time and set up. Patient was able to sit upright in bed with mod to max A when leaning to L for over 10 mins on this date. Patient noted to have small blisters on back with nurse shown while in this position. Patient's discharge plan remains appropriate at this time. OT will continue to follow acutely.     Recommendations for follow up therapy are one component of a multi-disciplinary discharge planning process, led by the attending physician.  Recommendations may be updated based on patient status, additional functional criteria and insurance authorization.    Follow Up Recommendations  SNF    Equipment Recommendations  None recommended by OT    Recommendations for Other Services      Precautions / Restrictions Precautions Precautions: Fall Precaution Comments: PT Orders from Maureen Ralphs, MD; "WBAT Left LE, only  up with a walker, does not need brace at this standpoint." Also in his note says if probalems with mobility - use brace again. Required Braces or Orthoses: Knee Immobilizer - Left Knee Immobilizer - Left: On at all times Other Brace: ok to loosen Left brace when resting. Per Dr. Magnus Ivan, OK to use  KI vs [facility] . Have modified the KI and added 2 metal stays across the anterior knee(from another  KI.) Restrictions Weight Bearing Restrictions: Yes LLE Weight Bearing: Weight bearing as tolerated Other Position/Activity Restrictions: PT Orders from Maureen Ralphs, MD; "WBAT Left LE, only up with a walker, does not need brace at this standpoint"       Mobility Bed Mobility Overal bed mobility: Needs Assistance Bed Mobility: Rolling Rolling: +2 for physical assistance;Max assist         General bed mobility comments: patient was able to tolerate sitting in chair position with back off back of bed with mod to max A for leaning to L side with educaiton on leaning to right side for over 12 mins on this date.    Transfers                      Balance                                           ADL either performed or assessed with clinical judgement   ADL Overall ADL's : Needs assistance/impaired     Grooming: Moderate  assistance;Oral care;Sitting Grooming Details (indicate cue type and reason): sititng up in bed with set up for brusthing teeth with use of yonker for making mouth clear                                     Vision Patient Visual Report: No change from baseline     Perception     Praxis      Cognition Arousal/Alertness: Awake/alert Behavior During Therapy: Flat affect Overall Cognitive Status: Within Functional Limits for tasks assessed                                 General Comments: patient required continued motivational cues and education on positive changes in status to participate further in session         Exercises     Shoulder Instructions       General Comments      Pertinent Vitals/ Pain       Pain Assessment: Faces Faces Pain Scale: Hurts even more Pain Location: low back Pain Descriptors / Indicators: Discomfort;Grimacing;Guarding;Pins and needles;Sore;Spasm Pain Intervention(s): Monitored during session;Limited activity within patient's tolerance  Home Living                                          Prior Functioning/Environment              Frequency  Min 2X/week        Progress Toward Goals  OT Goals(current goals can now be found in the care plan section)  Progress towards OT goals: Progressing toward goals     Plan Discharge plan remains appropriate    Co-evaluation    PT/OT/SLP Co-Evaluation/Treatment: Yes Reason for Co-Treatment: For patient/therapist safety;To address functional/ADL transfers;Complexity of the patient's impairments (multi-system involvement) PT goals addressed during session: Mobility/safety with mobility OT goals addressed during session: ADL's and self-care      AM-PAC OT "6 Clicks" Daily Activity     Outcome Measure   Help from another person eating meals?: A Lot Help from another person taking care of personal grooming?: A Lot Help from another person toileting, which includes using toliet, bedpan, or urinal?: Total Help from another person bathing (including washing, rinsing, drying)?: A Lot Help from another person to put on and taking off regular upper body clothing?: Total Help from another person to put on and taking off regular lower body clothing?: Total 6 Click Score: 9    End of Session    OT Visit Diagnosis: Other abnormalities of gait and mobility (R26.89);History of falling (Z91.81);Pain;Muscle weakness (generalized) (M62.81) Pain - part of body:  (low back)   Activity Tolerance Patient tolerated treatment well   Patient Left in bed;with call bell/phone within reach   Nurse  Communication          Time: 1140-1230 OT Time Calculation (min): 50 min  Charges: OT General Charges $OT Visit: 1 Visit OT Treatments $Self Care/Home Management : 8-22 mins  Sharyn Blitz OTR/L, MS Acute Rehabilitation Department Office# [phone] Pager# [phone]   Chalmers Guest Saadia Dewitt 12/28/2020, 12:45 PM

## 2020-12-28 NOTE — Progress Notes (Signed)
Physical Therapy Treatment Patient Details Name: Carla Little MRN: [id number] DOB: Jul 31, 1990 Today's Date: 12/28/2020   History of Present Illness Patient is a 30 y.o. female who presented to [facility] for intractable N/V on 9/17. ED labwork reveals e. coli UTI. Pt had recent hospital admission from 8/8-9/5 due to Lt knee dislocation which was reduced with fractures of the proximal fibula ligamentous avulsion laterally as well as medially off the medial femoral condyle and MRI showed complete ACL & PCL tears and MCL strain. That hospital admission was complicated by nausea, vomiting, tachycardia, UTI, and fecal impaction;. PMH significant for morbid obesity, GERD, fatty liver disease, depression. Patient now with guillan barre.    PT Comments    Patient's bed placed in max chair position, feet against foot board. Patient assisted  to lean forward with no back support. Patient able to weight shift  forward and to each side  to maintain balance. Initially required max support, gradually able to find balance  point with mmore forward lean.  Patient sat x 20 minutes in upright position.  Patient with noted blisters on back, RN  called into room. Patient has been lying on a heating pad at 107* with plastic against her. RN recommended a blanket layer and the  pad was placed  with appropriate side against patient.  Patient continues with significant neurological impairments of all extremities, RUE more so than left, and LLE  has less active also history of deranged knee.  Patient asking when will she walk. Explained that recovery from GBS is diifferent and unknown the speed of recovery. Encouraged patient to particpate in therapies to increase active movements and trunk control. Will attempt controlled tilt  on bed next visit.  Recommendations for follow up therapy are one component of a multi-disciplinary discharge planning process, led by the attending physician.  Recommendations may be updated based on  patient status, additional functional criteria and insurance authorization.  Follow Up Recommendations  SNF     Equipment Recommendations  Wheelchair cushion (measurements PT);Wheelchair (measurements PT)    Recommendations for Other Services       Precautions / Restrictions Precautions Precautions: Fall Precaution Comments: PT Orders from Maureen Ralphs, MD; "WBAT Left LE, only up with a walker, does not need brace at this standpoint." Also in his note says if probalems with mobility - use brace again. Required Braces or Orthoses: Knee Immobilizer - Left Knee Immobilizer - Left: On at all times Other Brace: does not need KI at this time but may consider since opatient has more weakness due GBS Restrictions Weight Bearing Restrictions: Yes LLE Weight Bearing: Weight bearing as tolerated Other Position/Activity Restrictions: PT Orders from Maureen Ralphs, MD; "WBAT Left LE, only up with a walker, does not need brace at this standpoint"     Mobility  Bed Mobility Overal bed mobility: Needs Assistance Bed Mobility: Rolling Rolling: +2 for physical assistance;Max assist         General bed mobility comments: patient was able to tolerate sitting in chair position with back off back of bed with min  to max A for leaning to L side with educaiton on leaning to right side for over 20 mins12 mins on this date.    Transfers                 General transfer comment: left  in bed  Ambulation/Gait                 Stairs  Wheelchair Mobility    Modified Rankin (Stroke Patients Only)       Balance Overall balance assessment: Needs assistance Sitting-balance support: Bilateral upper extremity supported;No upper extremity supported   Sitting balance - Comments: bed placed in chair position, HOB 60*. Patient assisted to sitting forward with no back support. worked on leaning forward and shifting to each side as needed to maintain balance.                                     Cognition Arousal/Alertness: Awake/alert Behavior During Therapy: Flat affect Overall Cognitive Status: Within Functional Limits for tasks assessed                                 General Comments: patient required continued motivational cues and education on positive changes in status to participate further in session      Exercises General Exercises - Lower Extremity Long Arc Quad: AAROM;Right;10 reps    General Comments        Pertinent Vitals/Pain Pain Assessment: Faces Faces Pain Scale: Hurts even more Pain Location: low back Pain Descriptors / Indicators: Discomfort;Grimacing;Guarding;Pins and needles;Sore;Spasm Pain Intervention(s): Monitored during session;Patient requesting pain meds-RN notified;Heat applied    Home Living                      Prior Function            PT Goals (current goals can now be found in the care plan section) Progress towards PT goals: Progressing toward goals    Frequency    Min 2X/week      PT Plan Current plan remains appropriate    Co-evaluation PT/OT/SLP Co-Evaluation/Treatment: Yes Reason for Co-Treatment: For patient/therapist safety;Complexity of the patient's impairments (multi-system involvement) PT goals addressed during session: Mobility/safety with mobility OT goals addressed during session: ADL's and self-care      AM-PAC PT "6 Clicks" Mobility   Outcome Measure  Help needed turning from your back to your side while in a flat bed without using bedrails?: Total Help needed moving from lying on your back to sitting on the side of a flat bed without using bedrails?: Total Help needed moving to and from a bed to a chair (including a wheelchair)?: Total Help needed standing up from a chair using your arms (e.g., wheelchair or bedside chair)?: Total Help needed to walk in hospital room?: Total Help needed climbing 3-5 steps with a railing? : Total 6  Click Score: 6    End of Session   Activity Tolerance: Patient tolerated treatment well Patient left: in bed;with call bell/phone within reach Nurse Communication: Mobility status;Need for lift equipment PT Visit Diagnosis: Muscle weakness (generalized) (M62.81)     Time: 2505-3976 PT Time Calculation (min) (ACUTE ONLY): 52 min  Charges:  $Therapeutic Activity: 23-37 mins                     Blanchard Kelch PT Acute Rehabilitation Services Pager [phone] Office [phone]    Rada Hay 12/28/2020, 2:49 PM

## 2020-12-28 NOTE — Progress Notes (Addendum)
Informed by physical therapy that patient has a blister on her back from the heating pad. On inspection patient has an 1/8 of an inch line on mid back that was from heating pad. Heating pad was place wrong side up to patient's back and small site was exposed  to patient's back that was not cover by a towel.Placed blanket on top of heating pad right side up. Patient reporting no pain or discomfort at this time.Tempeture was cut down. Will reassess.

## 2020-12-28 NOTE — Progress Notes (Signed)
Patient has stated all day that she can not use her hands and she need [place] to feed her. Walked in patient's room and patient is typing on her cell phone. Patient refuses to help with her care, claiming that she can't help roll over or assist with any movement.

## 2020-12-28 NOTE — Progress Notes (Signed)
PROGRESS NOTE  Carla Little [id number]:[id number] DOB: [birth year] DOA: 12/09/2020 PCP: Patient, No Pcp Per (Inactive)  HPI/Recap of past 44 hours: 30 year old female with past medical history of morbid obesity with BMI greater than 85, fatty liver disease and depression who presented with nausea/vomiting intractable and poor p.o. intake.  Patient underwent recent endoscopy on 8/27 noting small antral ulcer.  Symptoms thought to be secondary to gastroparesis/esophagitis and opiate induced constipation.  Symptoms have since improved.  Diet being slowly advanced.  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  Seen by neurology and completed course of IVIG.  At this point now waiting for skilled nursing facility placement and tolerance of diet advancement.  Otherwise patient with no complaints.  Assessment/Plan: Principal Problem:   Intractable vomiting with nausea/peptic ulcer disease: Symptoms have since improved.  Completed course of Diflucan. 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  Status post 5-day course of IVIG.  Will need continued rehab.  Skilled nursing facility pending.    Left knee dislocation: Seen by orthopedics, Dr. Magnus Ivan who recommended brace for 2 to 4 weeks.  For skilled nursing.  Depression: Continue SSRI.    Nonalcoholic steatohepatitis (NASH)   Class 3 obesity [facility]): Patient meets criteria BMI greater than 40.   Sinus tachycardia: Mild, on beta-blocker, changed to oral once swallowing better.  Pressure Injury 11/19/20 Buttocks Right Stage 2 -  Partial thickness loss of dermis presenting as a shallow open injury with a red, pink wound bed without slough. (Active)  11/19/20 2100  Location: Buttocks  Location Orientation: Right  Staging: Stage 2 -  Partial thickness loss of dermis presenting as a shallow open injury with a red, pink wound bed without  slough.  Wound Description (Comments):   Present on Admission:      GERD (gastroesophageal reflux disease)   Iron deficiency anemia due to chronic blood loss: Secondary to heavy menorrhagia.  Status post packed red blood cell transfusion.  On p.o. iron.    E. coli UTI: Status post antibiotics  B12/folate deficiency, replacement  Code Status: Full code  Family Communication: Cousin  Disposition Plan: Needs skilled nursing.  Watching to see how she tolerates p.o.   Consultants: Gastroenterology Neurology Psychiatry Wound care  Procedures: Status post lumbar puncture by interventional radiology 9/28 Packed red blood cell transfusion  Antimicrobials: Ancef x1 Completed course of Rocephin and Flagyl  DVT prophylaxis: SCDs  Level of care: Telemetry   Objective: Vitals:   12/28/20 0319 12/28/20 1300  BP: 125/79 126/79  Pulse: 100 93  Resp: 20   Temp: 98.1 F (36.7 C) 97.9 F (36.6 C)  SpO2: 100% 98%    Intake/Output Summary (Last 24 hours) at 12/28/2020 1404 Last data filed at 12/28/2020 0321 Gross per 24 hour  Intake --  Output 350 ml  Net -350 ml   Filed Weights   12/27/20 0500 12/27/20 2023 12/28/20 0442  Weight: (!) 228 kg (!) 228 kg (!) 228 kg   Body mass index is 86.28 kg/m.  Exam:  General: Alert and oriented x3, no acute distress HEENT: Normocephalic, atraumatic, mucous membranes slightly dry, narrow airway Cardiovascular: Regular rate and rhythm, S1-S2 Respiratory: Decreased breath sounds throughout secondary to body habitus Abdomen: Soft, obese, nontender, positive bowel sounds Musculoskeletal: No clubbing or cyanosis, 1+ pitting edema bilaterally Psychiatry: Flattened affect, no evidence of acute psychoses   Data Reviewed: CBC: Recent Labs  Lab 12/22/20 0442 12/23/20 1826 12/24/20 0643 12/26/20  1536  WBC 10.4 7.4 6.6 6.1  NEUTROABS 6.0  --   --   --   HGB 8.4* 8.3* 7.8* 7.9*  HCT 26.9* 26.7* 25.5* 26.9*  MCV 103.1* 104.3* 105.4*  108.0*  PLT 261 250 262 399   Basic Metabolic Panel: Recent Labs  Lab 12/22/20 0442 12/23/20 0524 12/23/20 0836 12/23/20 1826 12/24/20 0643 12/25/20 0545 12/26/20 1536  NA 136 138  --  137 134* 135 138  K 3.3* 5.7* 3.8 3.8 4.1 5.1 3.9  CL 98 97*  --  100 99 100 103  CO2 28 28  --  31 30 29 30   GLUCOSE 99 91  --  105* 97 95 87  BUN 9 12  --  11 10 9 8   CREATININE 0.37* 0.48  --  0.38* <0.30* <0.30* 0.40*  CALCIUM 8.9 9.1  --  8.8* 8.3* 8.8* 8.9  MG 2.0 2.0  --   --   --   --   --    GFR: Estimated Creatinine Clearance: 199.5 mL/min (A) (by C-G formula based on SCr of 0.4 mg/dL (L)). Liver Function Tests: No results for input(s): AST, ALT, ALKPHOS, BILITOT, PROT, ALBUMIN in the last 168 hours. No results for input(s): LIPASE, AMYLASE in the last 168 hours. No results for input(s): AMMONIA in the last 168 hours. Coagulation Profile: No results for input(s): INR, PROTIME in the last 168 hours. Cardiac Enzymes: No results for input(s): CKTOTAL, CKMB, CKMBINDEX, TROPONINI in the last 168 hours. BNP (last 3 results) No results for input(s): PROBNP in the last 8760 hours. HbA1C: No results for input(s): HGBA1C in the last 72 hours. CBG: Recent Labs  Lab 12/21/20 2238  GLUCAP 110*   Lipid Profile: No results for input(s): CHOL, HDL, LDLCALC, TRIG, CHOLHDL, LDLDIRECT in the last 72 hours. Thyroid Function Tests: No results for input(s): TSH, T4TOTAL, FREET4, T3FREE, THYROIDAB in the last 72 hours. Anemia Panel: No results for input(s): VITAMINB12, FOLATE, FERRITIN, TIBC, IRON, RETICCTPCT in the last 72 hours. Urine analysis:    Component Value Date/Time   COLORURINE AMBER (A) 12/11/2020 0037   APPEARANCEUR CLOUDY (A) 12/11/2020 0037   LABSPEC 1.020 12/11/2020 0037   PHURINE 5.0 12/11/2020 0037   GLUCOSEU NEGATIVE 12/11/2020 0037   HGBUR LARGE (A) 12/11/2020 0037   BILIRUBINUR NEGATIVE 12/11/2020 0037   KETONESUR 5 (A) 12/11/2020 0037   PROTEINUR 100 (A) 12/11/2020  0037   UROBILINOGEN 1.0 10/27/2013 2116   NITRITE NEGATIVE 12/11/2020 0037   LEUKOCYTESUR LARGE (A) 12/11/2020 0037   Sepsis Labs: @LABRCNTIP (procalcitonin:4,lacticidven:4)  ) Recent Results (from the past 240 hour(s))  CSF culture w Gram Stain     Status: None   Collection Time: 12/21/20 12:48 PM   Specimen: PATH Cytology CSF; Cerebrospinal Fluid  Result Value Ref Range Status   Specimen Description   Final    CSF Performed at [facility], [address], [place], [place]    Special Requests   Final    NONE Performed at [facility], [address] [place], [place]    Gram Stain   Final    WBC PRESENT,BOTH PMN AND MONONUCLEAR NO ORGANISMS SEEN Gram Stain Report Called to,Read Back By and Verified With: C.Waterford, RN AT 1418 ON 09.29.22 BY N.THOMPSON Performed at [facility], [address] [place], [place]    Culture   Final    NO GROWTH 3 DAYS Performed at [facility], [address] [place], [place] [address]    Report Status 12/24/2020 FINAL  Final      Studies: No results found.  Scheduled Meds:  bisacodyl  10 mg Rectal Once   busPIRone  5 mg Oral BID   ciprofloxacin  2 drop Right Eye Q4H while awake   [START ON 01/01/2021] cyanocobalamin  1,000 mcg Intramuscular Weekly   folic acid  1 mg Oral Daily   gabapentin  100 mg Oral Q8H   lidocaine  1 patch Transdermal Daily   lidocaine (PF)  20 mL Intradermal Once   magic mouthwash w/lidocaine  5 mL Oral TID AC   metoprolol tartrate  10 mg Intravenous Q6H   multivitamin  15 mL Oral Daily   pantoprazole  40 mg Oral BID   polyethylene glycol  17 g Oral Daily   prochlorperazine  10 mg Intravenous Q8H   senna-docusate  1 tablet Oral BID   sodium chloride flush  10-40 mL Intracatheter Q12H    Continuous Infusions:  sodium chloride     ondansetron (ZOFRAN) IV 8 mg (12/15/20 1750)     LOS: 18 days     Hollice Espy, MD [facility]   12/28/2020, 2:04 PM

## 2020-12-28 NOTE — Progress Notes (Signed)
RT NOTE:  NIF -40 VC: not able to obtain due to equipment malfunction.

## 2020-12-28 NOTE — Progress Notes (Signed)
NIF greater than -60  VC 2.68  Patient gave good effort with all 3 attempts.

## 2020-12-28 NOTE — Progress Notes (Signed)
Reassessed patient's back. No blister to assess. Small line still on patient's back. Patient reporting no pain from area.

## 2020-12-29 NOTE — Progress Notes (Signed)
PROGRESS NOTE    Carla Little  VZD:[id number]  DOB: Dec 20, 1990  DOA: 12/09/2020 PCP: Patient, No Pcp Per (Inactive) Outpatient Specialists:   Hospital course:  30 year old female with morbid obesity and BMI greater than 85 was admitted 12/09/2020 with weakness.  She was treated for Guillain-Barr status post IVIG.  Nausea/vomiting was thought to be secondary to gastroparesis which is improved with conservative management.  She is presently awaiting placement.   Subjective:  "I am as okay as I can be".   Objective: Vitals:   12/28/20 2027 12/29/20 0157 12/29/20 0327 12/29/20 1124  BP: 124/86  118/82 133/89  Pulse: 100  100 (!) 104  Resp: 20  20 17   Temp: 98.3 F (36.8 C)  98.5 F (36.9 C) 98.2 F (36.8 C)  TempSrc: Axillary  Axillary Axillary  SpO2: 98%  95% 93%  Weight: (!) 228 kg (!) 228 kg    Height:        Intake/Output Summary (Last 24 hours) at 12/29/2020 1522 Last data filed at 12/29/2020 0328 Gross per 24 hour  Intake --  Output 700 ml  Net -700 ml   Filed Weights   12/28/20 0442 12/28/20 2027 12/29/20 0157  Weight: (!) 228 kg (!) 228 kg (!) 228 kg     Exam:  General: Young female with massive obesity lying in bed in no acute distress. Eyes: sclera anicteric, conjuctiva mild injection bilaterally CVS: S1-S2, regular  Respiratory:  decreased air entry bilaterally secondary to decreased inspiratory effort, rales at bases  GI: Obese NABS, soft, NT  LE: No edema.  Neuro: A/O x 3, Moving all extremities equally with normal strength, CN 3-12 intact, grossly nonfocal.  Psych: patient is logical and coherent, judgement and insight appear normal, mood and affect appropriate to situation.   Assessment & Plan:   30 year old female with morbid obesity with BMI greater than 85 is awaiting placement after treatment for possible Guillain-Barr and gastroparesis.  Subacute back pain Remains on Dilaudid 0.5 every 4 hours PRN and lidocaine patch  Morbid  obesity Patient will need long-term Multi modal and psychiatric therapy Intractable nausea and vomiting Improved with conservative management Thought to be secondary to gastroparesis, esophagitis and opioid use  Paresthesia/upper extremity weakness thought to be secondary to Guillain-Barr Status post IVIG Will need continued rehab  E. coli UTI Status post treatment  Left knee dislocation Brace for 2 to 4 weeks per Dr. 38 orthopedics PT recommends SNF  Depression Seen by psychiatry Continue buspirone   DVT prophylaxis: SCD Code Status: Full Family Communication: None today Disposition Plan:   Patient is from: Home  Anticipated Discharge Location: SNF  Barriers to Discharge: Bed availability  Is patient medically stable for Discharge: Yes   Consultants: Neurology Psychiatry Antimicrobials: None at present   Data Reviewed:  Basic Metabolic Panel: Recent Labs  Lab 12/23/20 0524 12/23/20 0836 12/23/20 1826 12/24/20 0643 12/25/20 0545 12/26/20 1536 12/28/20 1531  NA 138  --  137 134* 135 138 139  K 5.7*   < > 3.8 4.1 5.1 3.9 4.2  CL 97*  --  100 99 100 103 100  CO2 28  --  31 30 29 30 30   GLUCOSE 91  --  105* 97 95 87 92  BUN 12  --  11 10 9 8 7   CREATININE 0.48  --  0.38* <0.30* <0.30* 0.40* <0.30*  CALCIUM 9.1  --  8.8* 8.3* 8.8* 8.9 8.7*  MG 2.0  --   --   --   --   --   --    < > =  values in this interval not displayed.   Liver Function Tests: No results for input(s): AST, ALT, ALKPHOS, BILITOT, PROT, ALBUMIN in the last 168 hours. No results for input(s): LIPASE, AMYLASE in the last 168 hours. No results for input(s): AMMONIA in the last 168 hours. CBC: Recent Labs  Lab 12/23/20 1826 12/24/20 0643 12/26/20 1536 12/28/20 1531  WBC 7.4 6.6 6.1 6.6  HGB 8.3* 7.8* 7.9* 8.1*  HCT 26.7* 25.5* 26.9* 27.8*  MCV 104.3* 105.4* 108.0* 106.9*  PLT 250 262 399 461*   Cardiac Enzymes: No results for input(s): CKTOTAL, CKMB, CKMBINDEX, TROPONINI  in the last 168 hours. BNP (last 3 results) No results for input(s): PROBNP in the last 8760 hours. CBG: No results for input(s): GLUCAP in the last 168 hours.  Recent Results (from the past 240 hour(s))  CSF culture w Gram Stain     Status: None   Collection Time: 12/21/20 12:48 PM   Specimen: PATH Cytology CSF; Cerebrospinal Fluid  Result Value Ref Range Status   Specimen Description   Final    CSF Performed at [facility], [address] [place], [place] [address]    Special Requests   Final    NONE Performed at [facility], [address] [place], [place] [address]    Gram Stain   Final    WBC PRESENT,BOTH PMN AND MONONUCLEAR NO ORGANISMS SEEN Gram Stain Report Called to,Read Back By and Verified With: C.Earnestine Leys, RN AT 1418 ON 09.29.22 BY N.THOMPSON Performed at [facility], [address] [place], [place] [address]    Culture   Final    NO GROWTH 3 DAYS Performed at [facility], [address] [place], [place] [address]    Report Status 12/24/2020 FINAL  Final      Studies: No results found.   Scheduled Meds:  bisacodyl  10 mg Rectal Once   busPIRone  5 mg Oral BID   ciprofloxacin  2 drop Right Eye Q4H while awake   [START ON 01/01/2021] cyanocobalamin  1,000 mcg Intramuscular Weekly   folic acid  1 mg Oral Daily   gabapentin  100 mg Oral Q8H   lidocaine  1 patch Transdermal Daily   lidocaine (PF)  20 mL Intradermal Once   magic mouthwash w/lidocaine  5 mL Oral TID AC   metoprolol tartrate  10 mg Intravenous Q6H   multivitamin  15 mL Oral Daily   pantoprazole  40 mg Oral BID   polyethylene glycol  17 g Oral Daily   prochlorperazine  10 mg Intravenous Q8H   senna-docusate  1 tablet Oral BID   sodium chloride flush  10-40 mL Intracatheter Q12H   Continuous Infusions:  sodium chloride     ondansetron (ZOFRAN) IV 8 mg (12/15/20 1750)    Principal Problem:   Intractable vomiting with  nausea Active Problems:   Left knee dislocation   PUD (peptic ulcer disease)   Nonalcoholic steatohepatitis (NASH)   Class 3 obesity (HCC)   Sinus tachycardia   Hypokalemia   GERD (gastroesophageal reflux disease)   Iron deficiency anemia due to chronic blood loss   E. coli UTI   Guillain Barr syndrome (HCC)     Demiah Gullickson Tublu Travion Ke, [facility]  If 7PM-7AM, please contact night-coverage [email or web address]   LOS: 19 days

## 2020-12-29 NOTE — Progress Notes (Signed)
Physical Therapy Treatment Patient Details Name: Carla Little MRN: [id number] DOB: 24-Oct-1990 Today's Date: 12/29/2020   History of Present Illness Patient is a 30 y.o. female who presented to [facility] for intractable N/V on 9/17. ED labwork reveals e. coli UTI. Pt had recent hospital admission from 8/8-9/5 due to Lt knee dislocation which was reduced with fractures of the proximal fibula ligamentous avulsion laterally as well as medially off the medial femoral condyle and MRI showed complete ACL & PCL tears and MCL strain. That hospital admission was complicated by nausea, vomiting, tachycardia, UTI, and fecal impaction;. PMH significant for morbid obesity, GERD, fatty liver disease, depression. Patient now with guillan barre.    PT Comments    The patient placed in tilt position of bed to 32*, difficult to position each leg, both knees lax and tend to hyperextend, placed pillows under each knee and beside each leg to attempt preventing ankle inversion and  legs rotating outward, bed strap placed tightly to attempt control of the legs. Once in tilted position, legs and ankles could not be controlled so returned to flat. Placed in chair position with HOB 65**. Patient worked on trunk control in sitting without back support x 20 minutes., wotked on reaching forward and diagonals with max assistance, trunk rotations x 5.  Patient tolerated well.  Continue PT for neuromuscular reed, mobility and tilting to get weight through legs. . T  Recommendations for follow up therapy are one component of a multi-disciplinary discharge planning process, led by the attending physician.  Recommendations may be updated based on patient status, additional functional criteria and insurance authorization.  Follow Up Recommendations  SNF     Equipment Recommendations  Wheelchair cushion (measurements PT);Wheelchair (measurements PT)    Recommendations for Other Services       Precautions / Restrictions  Precautions Precaution Comments: PT Orders from Maureen Ralphs, MD; "WBAT Left LE, only up with a walker, does not need brace at this standpoint." Also in his note says if probalems with mobility - use brace again.     Mobility  Bed Mobility   Bed Mobility: Rolling Rolling: +2 for physical assistance;Max assist   Supine to sit: HOB elevated;+2 for physical assistance;Min assist     General bed mobility comments: with  bed in chair position, patient able to sit forward with min assistance Start Time: 1045 Angle: 32 degrees Total Minutes in Angle: 8 minutes Patient Response: Cooperative  Transfers                    Ambulation/Gait                 Stairs             Wheelchair Mobility    Modified Rankin (Stroke Patients Only)       Balance       Sitting balance - Comments: bed placed in chair position, HOB 68*. Patient assisted to sitting forward with no back support, min assistnace, intermittent cues to lean forward, mod assist to support when leans back. . worked on leaning forward and shifting to each side as needed to maintain balance. worked on Assisted trunk rotations x 5                                    Cognition Arousal/Alertness: Awake/alert Behavior During Therapy: Flat affect Overall Cognitive Status: Within Functional Limits for tasks assessed  General Comments: more cheerful when ungaged with persons in room.      Exercises General Exercises - Upper Extremity Shoulder Flexion: PROM Elbow Extension: AAROM;Both;10 reps General Exercises - Lower Extremity Ankle Circles/Pumps: AROM;Both;15 reps;Supine Heel Slides: AROM;Right;10 reps;AAROM    General Comments        Pertinent Vitals/Pain Pain Score: 8  Pain Location: low back Pain Descriptors / Indicators: Discomfort;Grimacing;Guarding;Pins and needles;Sore;Spasm Pain Intervention(s): Premedicated before  session;Monitored during session    Home Living                      Prior Function            PT Goals (current goals can now be found in the care plan section) Progress towards PT goals: Progressing toward goals    Frequency    Min 2X/week      PT Plan Current plan remains appropriate    Co-evaluation              AM-PAC PT "6 Clicks" Mobility   Outcome Measure  Help needed turning from your back to your side while in a flat bed without using bedrails?: A Lot Help needed moving from lying on your back to sitting on the side of a flat bed without using bedrails?: A Lot Help needed moving to and from a bed to a chair (including a wheelchair)?: Total Help needed standing up from a chair using your arms (e.g., wheelchair or bedside chair)?: Total Help needed to walk in hospital room?: Total Help needed climbing 3-5 steps with a railing? : Total 6 Click Score: 8    End of Session   Activity Tolerance: Patient tolerated treatment well Patient left: in bed;with call bell/phone within reach Nurse Communication: Mobility status;Need for lift equipment PT Visit Diagnosis: Muscle weakness (generalized) (M62.81)     Time: 1030-1150 PT Time Calculation (min) (ACUTE ONLY): 80 min  Charges:  $Therapeutic Activity: 38-52 mins $Neuromuscular Re-education: 23-37 mins                     Blanchard Kelch PT Acute Rehabilitation Services Pager [phone] Office [phone]    Rada Hay 12/29/2020, 4:36 PM

## 2020-12-29 NOTE — Progress Notes (Addendum)
NIF -60 FVC 2.4L

## 2020-12-30 LAB — BASIC METABOLIC PANEL
Anion gap: 7 (ref 5–15)
BUN: 8 mg/dL (ref 6–20)
CO2: 29 mmol/L (ref 22–32)
Calcium: 9.1 mg/dL (ref 8.9–10.3)
Chloride: 105 mmol/L (ref 98–111)
Creatinine, Ser: <0.3 mg/dL — ABNORMAL LOW (ref 0.44–1.00)
Glucose, Bld: 99 mg/dL (ref 70–99)
Potassium: 4.6 mmol/L (ref 3.5–5.1)
Sodium: 141 mmol/L (ref 135–145)

## 2020-12-30 LAB — CBC
HCT: 28.5 % — ABNORMAL LOW (ref 36.0–46.0)
Hemoglobin: 8.3 g/dL — ABNORMAL LOW (ref 12.0–15.0)
MCH: 30.7 pg (ref 26.0–34.0)
MCHC: 29.1 g/dL — ABNORMAL LOW (ref 30.0–36.0)
MCV: 105.6 fL — ABNORMAL HIGH (ref 80.0–100.0)
Platelets: 582 10*3/uL — ABNORMAL HIGH (ref 150–400)
RBC: 2.7 MIL/uL — ABNORMAL LOW (ref 3.87–5.11)
RDW: 23.7 % — ABNORMAL HIGH (ref 11.5–15.5)
WBC: 8.5 10*3/uL (ref 4.0–10.5)
nRBC: 0 % (ref 0.0–0.2)

## 2020-12-30 MED ORDER — ENSURE ENLIVE PO LIQD
237.0000 mL | Freq: Two times a day (BID) | ORAL | Status: DC
  Administered 2020-12-31 – 2021-01-04 (×10): 237 mL via ORAL

## 2020-12-30 NOTE — Plan of Care (Signed)
  Problem: Education: Goal: Knowledge of General Education information will improve Description: Including pain rating scale, medication(s)/side effects and non-pharmacologic comfort measures Outcome: Progressing   Problem: Clinical Measurements: Goal: Will remain free from infection Outcome: Progressing Goal: Respiratory complications will improve Outcome: Progressing Goal: Cardiovascular complication will be avoided Outcome: Progressing   Problem: Activity: Goal: Risk for activity intolerance will decrease Outcome: Progressing   Problem: Nutrition: Goal: Adequate nutrition will be maintained Outcome: Progressing   Problem: Pain Managment: Goal: General experience of comfort will improve Outcome: Progressing   Problem: Safety: Goal: Ability to remain free from injury will improve Outcome: Progressing   Problem: Skin Integrity: Goal: Risk for impaired skin integrity will decrease Outcome: Progressing   

## 2020-12-30 NOTE — Progress Notes (Signed)
NIF -40 FVC 1.6L 

## 2020-12-30 NOTE — Progress Notes (Signed)
   12/30/20 2314  Assess: MEWS Score  BP 116/68  Pulse Rate (!) 115  Assess: MEWS Score  MEWS Temp 0  MEWS Systolic 0  MEWS Pulse 2  MEWS RR 0  MEWS LOC 0  MEWS Score 2  MEWS Score Color Yellow  Assess: if the MEWS score is Yellow or Red  Were vital signs taken at a resting state? Yes  Focused Assessment No change from prior assessment  Does the patient meet 2 or more of the SIRS criteria? No  Does the patient have a confirmed or suspected source of infection? No  Provider and Rapid Response Notified? No  MEWS guidelines implemented *See Row Information* No, vital signs rechecked  Treat  MEWS Interventions Administered scheduled meds/treatments  Assess: SIRS CRITERIA  SIRS Temperature  0  SIRS Pulse 1  SIRS Respirations  0  SIRS WBC 0  SIRS Score Sum  1

## 2020-12-30 NOTE — Progress Notes (Signed)
   12/30/20 0458  Assess: MEWS Score  Temp 98.6 F (37 C)  BP (!) 114/97  Pulse Rate (!) 111  Resp 20  SpO2 96 %  Assess: MEWS Score  MEWS Temp 0  MEWS Systolic 0  MEWS Pulse 2  MEWS RR 0  MEWS LOC 0  MEWS Score 2  MEWS Score Color Yellow  Assess: if the MEWS score is Yellow or Red  Were vital signs taken at a resting state? No  Focused Assessment No change from prior assessment  Does the patient meet 2 or more of the SIRS criteria? No  Does the patient have a confirmed or suspected source of infection? No  Provider and Rapid Response Notified? No  MEWS guidelines implemented *See Row Information* No, vital signs rechecked  Document  Patient Outcome Other (Comment) (patient remains stable)  Progress note created (see row info) Yes  Assess: SIRS CRITERIA  SIRS Temperature  0  SIRS Pulse 1  SIRS Respirations  0  SIRS WBC 0  SIRS Score Sum  1

## 2020-12-30 NOTE — Progress Notes (Signed)
  Received a referral from Chaplain Genesis Adams to see pt for continued support.  Chaplain provided listening support as Carla Little shared about her illness.  Carla Little requested prayer, which chaplain provided.  She also inquired about the [facility].  We will put in a referral and follow up with her next week about this.

## 2020-12-30 NOTE — Progress Notes (Signed)
PROGRESS NOTE  Carla Little ZOX:[id number] DOB: 06-17-1990 DOA: 12/09/2020 PCP: Patient, No Pcp Per (Inactive)  HPI/Recap of past 79 hours: 29 year old female with morbid obesity and BMI greater than 85% was admitted on December 09, 2020 due to generalized weakness.  She has Guillain-Barr syndrome status post IVIG she had nausea vomiting which was thought to be due to gastroparesis which has improved with conservative management She is presently waiting for placement.  Subjective: December 30, 2020: Patient seen and examined at bedside.  She is complaining of for poor p.o. intake and requesting for Ensure Denies any nausea or vomiting  Assessment/Plan: Principal Problem:   Intractable vomiting with nausea Active Problems:   Left knee dislocation   PUD (peptic ulcer disease)   Nonalcoholic steatohepatitis (NASH)   Class 3 obesity (HCC)   Sinus tachycardia   Hypokalemia   GERD (gastroesophageal reflux disease)   Iron deficiency anemia due to chronic blood loss   E. coli UTI   Guillain Barr syndrome (HCC) #1 chronic low back pain Continue Dilaudid 0.5 mg every 4 hours as needed and lidocaine patch  2.  Morbid obesity BMI is over 85 needs multidisciplinary approach to her weight loss regimen  3.  Intractable nausea and vomiting which does improve with conservative management It was thought to be due to gastroparesis or esophagitis and opiate use  4.  Paresthesia upper extremity with generalized Guillain-Barr syndrome.  Patient received IVIG Continue physical therapy  5.  Right left knee dislocation status post brace seen by orthopedic who advised 2 to 4 weeks of bracing Physical therapy recommend SNF  Code Status: Full  Severity of Illness: The appropriate patient status for this patient is INPATIENT. Inpatient status is judged to be reasonable and necessary in order to provide the required intensity of service to ensure the patient's safety. The patient's  presenting symptoms, physical exam findings, and initial radiographic and laboratory data in the context of their chronic comorbidities is felt to place them at high risk for further clinical deterioration. Furthermore, it is not anticipated that the patient will be medically stable for discharge from the hospital within 2 midnights of admission. The following factors support the patient status of inpatient.   " Waiting for placement  * I certify that at the point of admission it is my clinical judgment that the patient will require inpatient hospital care spanning beyond 2 midnights from the point of admission due to high intensity of service, high risk for further deterioration and high frequency of surveillance required.*   Family Communication: None at bedside  Disposition Plan:   Status is: Inpatient   Dispo: The patient is from: Home              Anticipated d/c is to: If              Anticipated d/c date is:               Patient currently not medically stable for discharge  Consultants: Psychiatry Neurology   Procedures: None  Antimicrobials: None  DVT prophylaxis: SCD   Objective: Vitals:   12/29/20 1820 12/29/20 1948 12/30/20 0458 12/30/20 0515  BP: 121/76 114/80 (!) 114/97 121/85  Pulse:  (!) 107 (!) 111 (!) 106  Resp: 19 20 20    Temp:  97.7 F (36.5 C) 98.6 F (37 C) 98.8 F (37.1 C)  TempSrc:  Oral Oral Oral  SpO2:  99% 96%   Weight:      Height:  Intake/Output Summary (Last 24 hours) at 12/30/2020 0858 Last data filed at 12/30/2020 0555 Gross per 24 hour  Intake 237 ml  Output 1000 ml  Net -763 ml   Filed Weights   12/28/20 0442 12/28/20 2027 12/29/20 0157  Weight: (!) 228 kg (!) 228 kg (!) 228 kg   Body mass index is 86.28 kg/m.  Exam:  General: 30 y.o. year-old female well developed well nourished in no acute distress.  Alert and oriented x3.  Morbidly obese patient is lying in a bed mattress [place] Cardiovascular: Regular rate and  rhythm with no rubs or gallops.  No thyromegaly or JVD noted.   Respiratory: Clear to auscultation with no wheezes or rales. Good inspiratory effort. Abdomen: Soft nontender nondistended with normal bowel sounds x4 quadrants. Musculoskeletal: No lower extremity edema. 2/4 pulses in all 4 extremities. Skin: No ulcerative lesions noted or rashes, Psychiatry: Mood is appropriate for condition and setting Neurology:    Data Reviewed: CBC: Recent Labs  Lab 12/23/20 1826 12/24/20 0643 12/26/20 1536 12/28/20 1531  WBC 7.4 6.6 6.1 6.6  HGB 8.3* 7.8* 7.9* 8.1*  HCT 26.7* 25.5* 26.9* 27.8*  MCV 104.3* 105.4* 108.0* 106.9*  PLT 250 262 399 461*   Basic Metabolic Panel: Recent Labs  Lab 12/23/20 1826 12/24/20 0643 12/25/20 0545 12/26/20 1536 12/28/20 1531  NA 137 134* 135 138 139  K 3.8 4.1 5.1 3.9 4.2  CL 100 99 100 103 100  CO2 31 30 29 30 30   GLUCOSE 105* 97 95 87 92  BUN 11 10 9 8 7   CREATININE 0.38* <0.30* <0.30* 0.40* <0.30*  CALCIUM 8.8* 8.3* 8.8* 8.9 8.7*   GFR: CrCl cannot be calculated (This lab value cannot be used to calculate CrCl because it is not a number: <0.30). Liver Function Tests: No results for input(s): AST, ALT, ALKPHOS, BILITOT, PROT, ALBUMIN in the last 168 hours. No results for input(s): LIPASE, AMYLASE in the last 168 hours. No results for input(s): AMMONIA in the last 168 hours. Coagulation Profile: No results for input(s): INR, PROTIME in the last 168 hours. Cardiac Enzymes: No results for input(s): CKTOTAL, CKMB, CKMBINDEX, TROPONINI in the last 168 hours. BNP (last 3 results) No results for input(s): PROBNP in the last 8760 hours. HbA1C: No results for input(s): HGBA1C in the last 72 hours. CBG: No results for input(s): GLUCAP in the last 168 hours. Lipid Profile: No results for input(s): CHOL, HDL, LDLCALC, TRIG, CHOLHDL, LDLDIRECT in the last 72 hours. Thyroid Function Tests: No results for input(s): TSH, T4TOTAL, FREET4, T3FREE,  THYROIDAB in the last 72 hours. Anemia Panel: No results for input(s): VITAMINB12, FOLATE, FERRITIN, TIBC, IRON, RETICCTPCT in the last 72 hours. Urine analysis:    Component Value Date/Time   COLORURINE AMBER (A) 12/11/2020 0037   APPEARANCEUR CLOUDY (A) 12/11/2020 0037   LABSPEC 1.020 12/11/2020 0037   PHURINE 5.0 12/11/2020 0037   GLUCOSEU NEGATIVE 12/11/2020 0037   HGBUR LARGE (A) 12/11/2020 0037   BILIRUBINUR NEGATIVE 12/11/2020 0037   KETONESUR 5 (A) 12/11/2020 0037   PROTEINUR 100 (A) 12/11/2020 0037   UROBILINOGEN 1.0 10/27/2013 2116   NITRITE NEGATIVE 12/11/2020 0037   LEUKOCYTESUR LARGE (A) 12/11/2020 0037   Sepsis Labs: @LABRCNTIP (procalcitonin:4,lacticidven:4)  ) Recent Results (from the past 240 hour(s))  CSF culture w Gram Stain     Status: None   Collection Time: 12/21/20 12:48 PM   Specimen: PATH Cytology CSF; Cerebrospinal Fluid  Result Value Ref Range Status   Specimen Description  Final    CSF Performed at [facility], [address], [place], [place] [address]    Special Requests   Final    NONE Performed at [facility], [address], [place], [place] [address]    Gram Stain   Final    WBC PRESENT,BOTH PMN AND MONONUCLEAR NO ORGANISMS SEEN Gram Stain Report Called to,Read Back By and Verified With: C.Earnestine Leys, RN AT 1418 ON 09.29.22 BY N.THOMPSON Performed at [facility], [address], [place], [place] [address]    Culture   Final    NO GROWTH 3 DAYS Performed at [facility], [address] [place], [place] [address]    Report Status 12/24/2020 FINAL  Final      Studies: No results found.  Scheduled Meds:  bisacodyl  10 mg Rectal Once   busPIRone  5 mg Oral BID   ciprofloxacin  2 drop Right Eye Q4H while awake   [START ON 01/01/2021] cyanocobalamin  1,000 mcg Intramuscular Weekly   folic acid  1 mg Oral Daily   gabapentin  100 mg Oral Q8H   lidocaine  1 patch  Transdermal Daily   lidocaine (PF)  20 mL Intradermal Once   magic mouthwash w/lidocaine  5 mL Oral TID AC   metoprolol tartrate  10 mg Intravenous Q6H   multivitamin  15 mL Oral Daily   pantoprazole  40 mg Oral BID   polyethylene glycol  17 g Oral Daily   prochlorperazine  10 mg Intravenous Q8H   senna-docusate  1 tablet Oral BID   sodium chloride flush  10-40 mL Intracatheter Q12H    Continuous Infusions:  sodium chloride     ondansetron (ZOFRAN) IV 8 mg (12/15/20 1750)     LOS: 20 days     Myrtie Neither, MD [facility]  To reach me or the doctor on call, go to: [email or web address] Password [email or web address]  12/30/2020, 8:58 AM

## 2020-12-30 NOTE — Progress Notes (Signed)
Pt VS retaken within 30 minutes of previous VS (@ 1320). Pt VS within adequate range  @ 1342 (see flowsheet). Yellow MEWs protocol not implemented. No change from pt previous assessment.

## 2020-12-30 NOTE — Progress Notes (Signed)
   12/30/20 0458 12/30/20 0515  Assess: MEWS Score  Temp 98.6 F (37 C) 98.8 F (37.1 C)  BP (!) 114/97 121/85  Pulse Rate (!) 111 (!) 106  Level of Consciousness  --  Alert  Assess: MEWS Score  MEWS Temp 0 0  MEWS Systolic 0 0  MEWS Pulse 2 1  MEWS RR 0 0  MEWS LOC 0 0  MEWS Score 2 1  MEWS Score Color Yellow Green  Assess: SIRS CRITERIA  SIRS Temperature  0 0  SIRS Pulse 1 1  SIRS Respirations  0 0  SIRS WBC 0 0  SIRS Score Sum  1 1

## 2020-12-31 LAB — IRON AND TIBC
Iron: 36 ug/dL (ref 28–170)
Saturation Ratios: 11 % (ref 10.4–31.8)
TIBC: 316 ug/dL (ref 250–450)
UIBC: 280 ug/dL

## 2020-12-31 MED ORDER — MIRTAZAPINE 15 MG PO TABS
15.0000 mg | ORAL_TABLET | Freq: Every day | ORAL | Status: DC
  Administered 2021-01-01 – 2021-01-07 (×7): 15 mg via ORAL
  Filled 2020-12-31 (×8): qty 1

## 2020-12-31 NOTE — Progress Notes (Signed)
Occupational Therapy Treatment Patient Details Name: Carla Little MRN: [id number] DOB: 1991-02-11 Today's Date: 12/31/2020   History of present illness Patient is a 30 y.o. female who presented to [facility] for intractable N/V on 9/17. ED labwork reveals e. coli UTI. Pt had recent hospital admission from 8/8-9/5 due to Lt knee dislocation which was reduced with fractures of the proximal fibula ligamentous avulsion laterally as well as medially off the medial femoral condyle and MRI showed complete ACL & PCL tears and MCL strain. That hospital admission was complicated by nausea, vomiting, tachycardia, UTI, and fecal impaction;. PMH significant for morbid obesity, GERD, fatty liver disease, depression. Patient now with guillan barre.   OT comments  Focused pt OT treatment on UE exercises, fine motor therapeutic activity, and education on compensatory strategy for cell phone use. Pt performed BUE shoulder, elbow, and wrist exercises and towel folding activity to add to daily exercise program (see below)requiring min multimodal cues for correct form. Began education on voice feature on cell phone, will continue to educate during next session. Pt verbalized understanding of education. Pt motivated to participate in therapy and expressed she would like to play a game next session to "help me get better". Continuing to recommend SNF post d/c, will continue to follow in the acute setting.   Recommendations for follow up therapy are one component of a multi-disciplinary discharge planning process, led by the attending physician.  Recommendations may be updated based on patient status, additional functional criteria and insurance authorization.    Follow Up Recommendations  SNF    Equipment Recommendations  None recommended by OT    Recommendations for Other Services      Precautions / Restrictions Precautions Precautions: Fall Precaution Comments: PT Orders from Maureen Ralphs, MD; "WBAT Left LE, only up  with a walker, does not need brace at this standpoint." Also in his note says if probalems with mobility - use brace again. Restrictions Weight Bearing Restrictions: Yes LLE Weight Bearing: Weight bearing as tolerated      Mobility Bed Mobility                    Transfers                      Balance                                           ADL either performed or assessed with clinical judgement   ADL Overall ADL's : Needs assistance/impaired                                       General ADL Comments: Due to needs, focused treatment session on UE exercises     Vision       Perception     Praxis      Cognition Arousal/Alertness: Awake/alert Behavior During Therapy: Flat affect Overall Cognitive Status: Within Functional Limits for tasks assessed                                 General Comments: Reports motivated to participate in therapy again tomorrow        Exercises Exercises: Hand exercises;Other exercises General Exercises - Upper Extremity Shoulder  Flexion: AAROM;Both;15 reps;Supine (requiring tactile cues to avoid internal rotation during shoulder flexion, BUEs fatiguing and needing min external assist to perform flexion.) Elbow Flexion: 15 reps;Both;Supine;AROM Hand Exercises Wrist Flexion: 15 reps;Both;Supine;AROM Wrist Extension: 15 reps;Both;Supine;AROM Digit Composite Flexion: AROM;10 reps;Squeeze ball Other Exercises Other Exercises: folding towel using BUEs, x5 (educated on performing 5-10x each, 3x daily.)   Shoulder Instructions       General Comments      Pertinent Vitals/ Pain       Pain Assessment: Faces Faces Pain Scale: Hurts little more Pain Location: low back Pain Descriptors / Indicators: Grimacing;Discomfort Pain Intervention(s): Monitored during session;Limited activity within patient's tolerance  Home Living                                           Prior Functioning/Environment              Frequency  Min 2X/week        Progress Toward Goals  OT Goals(current goals can now be found in the care plan section)  Progress towards OT goals: Progressing toward goals  Acute Rehab OT Goals Patient Stated Goal: to get better before going home OT Goal Formulation: With patient Time For Goal Achievement: 01/06/21 Potential to Achieve Goals: Fair ADL Goals Pt Will Perform Eating: with modified independence Pt Will Perform Grooming: with modified independence Pt Will Perform Upper Body Bathing: with supervision;with adaptive equipment;sitting Pt Will Perform Lower Body Dressing: with min assist;sit to/from stand Pt Will Transfer to Toilet: with min assist;ambulating;bedside commode Pt Will Perform Toileting - Clothing Manipulation and hygiene: with min assist;sit to/from stand;sitting/lateral leans Pt/caregiver will Perform Home Exercise Program: Increased ROM;Increased strength;With theraband;With Supervision Additional ADL Goal #1: Patient will demonstrae ability to feed self with AE and appropriate positioning x 10 bites as evidence of improving UE function. Additional ADL Goal #2: Patient will brush  teeth with setup as evidence of improving upper body function. Additional ADL Goal #3: Patient will sit at side of bed with min assist x 3 min as evidence of improving strength and endurance. Additional ADL Goal #4: Patient will toelrate 10 min UE exercise activity in bed.  Plan Discharge plan remains appropriate    Co-evaluation                 AM-PAC OT "6 Clicks" Daily Activity     Outcome Measure   Help from another person eating meals?: A Lot Help from another person taking care of personal grooming?: A Lot Help from another person toileting, which includes using toliet, bedpan, or urinal?: Total Help from another person bathing (including washing, rinsing, drying)?: A Lot Help from another person to  put on and taking off regular upper body clothing?: Total Help from another person to put on and taking off regular lower body clothing?: Total 6 Click Score: 9    End of Session    OT Visit Diagnosis: Other abnormalities of gait and mobility (R26.89);History of falling (Z91.81);Pain;Muscle weakness (generalized) (M62.81)   Activity Tolerance Patient tolerated treatment well   Patient Left in bed;with call bell/phone within reach;with bed alarm set   Nurse Communication Mobility status        Time: 1437-1510 OT Time Calculation (min): 33 min  Charges: OT General Charges $OT Visit: 1 Visit OT Treatments $Therapeutic Activity: 8-22 mins $Therapeutic Exercise: 8-22 mins  Prudence Davidson, OTS Acute Rehab Office:  [phone]   Latania Bascomb 12/31/2020, 4:00 PM

## 2020-12-31 NOTE — Progress Notes (Addendum)
Patient seen and examined  PROGRESS NOTE  Carla Little DJM:[id number] DOB: April 23, 1990 DOA: 12/09/2020 PCP: Patient, No Pcp Per (Inactive)  HPI/Recap of past 58 hours: 30 year old female with morbid obesity and BMI greater than 85% was admitted on December 09, 2020 due to generalized weakness.  She has Guillain-Barr syndrome status post IVIG she had nausea vomiting which was thought to be due to gastroparesis which has improved with conservative management She is presently waiting for placement.  Subjective: December 30, 2020: Patient seen and examined at bedside.  She is complaining of for poor p.o. intake and requesting for Ensure Denies any nausea or vomiting  December 31, 2020: Patient seen and examined at bedside Complained that she does not have any appetite she requested for Ensure yesterday which was ordered and she is taking it well but she is refusing to eat stating she is not hungry Ask her if she would like me to order her for something for depression she said yes please  Assessment/Plan: Principal Problem:   Intractable vomiting with nausea Active Problems:   Left knee dislocation   PUD (peptic ulcer disease)   Nonalcoholic steatohepatitis (NASH)   Class 3 obesity (HCC)   Sinus tachycardia   Hypokalemia   GERD (gastroesophageal reflux disease)   Iron deficiency anemia due to chronic blood loss   E. coli UTI   Guillain Barr syndrome (HCC) #1 chronic low back pain Continue Dilaudid 0.5 mg every 4 hours as needed and lidocaine patch  2.  Morbid obesity BMI is over 85 needs multidisciplinary approach to her weight loss regimen  3.  Intractable nausea and vomiting which does improve with conservative management It was thought to be due to gastroparesis or esophagitis and opiate use  4.  Paresthesia upper extremity with generalized Guillain-Barr syndrome.  Patient received 5 days of IVIG Continue physical therapy  5.  Right left knee dislocation status post  brace seen by orthopedic who advised 2 to 4 weeks of bracing Physical therapy recommend SNF  6.  Possible depression with loss of appetite I will start her on mirtazapine 15 mg at bedtime  Code Status: Full  Severity of Illness: The appropriate patient status for this patient is INPATIENT. Inpatient status is judged to be reasonable and necessary in order to provide the required intensity of service to ensure the patient's safety. The patient's presenting symptoms, physical exam findings, and initial radiographic and laboratory data in the context of their chronic comorbidities is felt to place them at high risk for further clinical deterioration. Furthermore, it is not anticipated that the patient will be medically stable for discharge from the hospital within 2 midnights of admission. The following factors support the patient status of inpatient.   " Waiting for placement  * I certify that at the point of admission it is my clinical judgment that the patient will require inpatient hospital care spanning beyond 2 midnights from the point of admission due to high intensity of service, high risk for further deterioration and high frequency of surveillance required.*   Family Communication: Her friend Leotis Shames is at bedside Disposition Plan:   Status is: Inpatient   Dispo: The patient is from: Home              Anticipated d/c is to: If              Anticipated d/c date is:               Patient currently not medically stable  for discharge  Consultants: Psychiatry Neurology   Procedures: Lumbar puncture  Antimicrobials: None  DVT prophylaxis: SCD   Objective: Vitals:   12/30/20 2031 12/30/20 2314 12/30/20 2329 12/31/20 0500  BP: 109/73 116/68  124/82  Pulse: (!) 106 (!) 115 (!) 101 (!) 101  Resp: 20   20  Temp: 98.2 F (36.8 C)   98.3 F (36.8 C)  TempSrc:    Oral  SpO2: 96%   97%  Weight:    (!) 202.7 kg  Height:        Intake/Output Summary (Last 24 hours) at  12/31/2020 1001 Last data filed at 12/31/2020 0623 Gross per 24 hour  Intake 477 ml  Output 850 ml  Net -373 ml    Filed Weights   12/28/20 2027 12/29/20 0157 12/31/20 0500  Weight: (!) 228 kg (!) 228 kg (!) 202.7 kg   Body mass index is 76.71 kg/m.  Exam:  General: 30 y.o. year-old female well developed well nourished in no acute distress.  Alert and oriented x3.  Morbidly obese patient is lying in a bed mattress [place] Cardiovascular: Regular rate and rhythm with no rubs or gallops.  No thyromegaly or JVD noted.   Respiratory: Clear to auscultation with no wheezes or rales. Good inspiratory effort. Abdomen: Soft nontender nondistended with normal bowel sounds x4 quadrants. Musculoskeletal: No lower extremity edema. 2/4 pulses in all 4 extremities. Skin: No ulcerative lesions noted or rashes, Psychiatry: Mood is appropriate for condition and setting Neurology:    Data Reviewed: CBC: Recent Labs  Lab 12/26/20 1536 12/28/20 1531 12/30/20 0936  WBC 6.1 6.6 8.5  HGB 7.9* 8.1* 8.3*  HCT 26.9* 27.8* 28.5*  MCV 108.0* 106.9* 105.6*  PLT 399 461* 582*    Basic Metabolic Panel: Recent Labs  Lab 12/25/20 0545 12/26/20 1536 12/28/20 1531 12/30/20 0936  NA 135 138 139 141  K 5.1 3.9 4.2 4.6  CL 100 103 100 105  CO2 29 30 30 29   GLUCOSE 95 87 92 99  BUN 9 8 7 8   CREATININE <0.30* 0.40* <0.30* <0.30*  CALCIUM 8.8* 8.9 8.7* 9.1    GFR: CrCl cannot be calculated (This lab value cannot be used to calculate CrCl because it is not a number: <0.30). Liver Function Tests: No results for input(s): AST, ALT, ALKPHOS, BILITOT, PROT, ALBUMIN in the last 168 hours. No results for input(s): LIPASE, AMYLASE in the last 168 hours. No results for input(s): AMMONIA in the last 168 hours. Coagulation Profile: No results for input(s): INR, PROTIME in the last 168 hours. Cardiac Enzymes: No results for input(s): CKTOTAL, CKMB, CKMBINDEX, TROPONINI in the last 168 hours. BNP (last  3 results) No results for input(s): PROBNP in the last 8760 hours. HbA1C: No results for input(s): HGBA1C in the last 72 hours. CBG: No results for input(s): GLUCAP in the last 168 hours. Lipid Profile: No results for input(s): CHOL, HDL, LDLCALC, TRIG, CHOLHDL, LDLDIRECT in the last 72 hours. Thyroid Function Tests: No results for input(s): TSH, T4TOTAL, FREET4, T3FREE, THYROIDAB in the last 72 hours. Anemia Panel: No results for input(s): VITAMINB12, FOLATE, FERRITIN, TIBC, IRON, RETICCTPCT in the last 72 hours. Urine analysis:    Component Value Date/Time   COLORURINE AMBER (A) 12/11/2020 0037   APPEARANCEUR CLOUDY (A) 12/11/2020 0037   LABSPEC 1.020 12/11/2020 0037   PHURINE 5.0 12/11/2020 0037   GLUCOSEU NEGATIVE 12/11/2020 0037   HGBUR LARGE (A) 12/11/2020 0037   BILIRUBINUR NEGATIVE 12/11/2020 0037   KETONESUR 5 (  A) 12/11/2020 0037   PROTEINUR 100 (A) 12/11/2020 0037   UROBILINOGEN 1.0 10/27/2013 2116   NITRITE NEGATIVE 12/11/2020 0037   LEUKOCYTESUR LARGE (A) 12/11/2020 0037   Sepsis Labs: @LABRCNTIP (procalcitonin:4,lacticidven:4)  ) Recent Results (from the past 240 hour(s))  CSF culture w Gram Stain     Status: None   Collection Time: 12/21/20 12:48 PM   Specimen: PATH Cytology CSF; Cerebrospinal Fluid  Result Value Ref Range Status   Specimen Description   Final    CSF Performed at [facility], [address], [place], [place]    Special Requests   Final    NONE Performed at [facility], [address], [place], [place]    Gram Stain   Final    WBC PRESENT,BOTH PMN AND MONONUCLEAR NO ORGANISMS SEEN Gram Stain Report Called to,Read Back By and Verified With: [email or web address], RN AT 1418 ON 09.29.22 BY N.[email or web address] Performed at [facility], [address] [place], [place]    Culture   Final    NO GROWTH 3 DAYS Performed at [facility], [address], [place],  [place]    Report Status 12/24/2020 FINAL  Final      Studies: No results found.  Scheduled Meds:  bisacodyl  10 mg Rectal Once   busPIRone  5 mg Oral BID   ciprofloxacin  2 drop Right Eye Q4H while awake   [START ON 01/01/2021] cyanocobalamin  1,000 mcg Intramuscular Weekly   feeding supplement  237 mL Oral BID BM   folic acid  1 mg Oral Daily   gabapentin  100 mg Oral Q8H   lidocaine  1 patch Transdermal Daily   lidocaine (PF)  20 mL Intradermal Once   magic mouthwash w/lidocaine  5 mL Oral TID AC   metoprolol tartrate  10 mg Intravenous Q6H   multivitamin  15 mL Oral Daily   pantoprazole  40 mg Oral BID   polyethylene glycol  17 g Oral Daily   prochlorperazine  10 mg Intravenous Q8H   senna-docusate  1 tablet Oral BID   sodium chloride flush  10-40 mL Intracatheter Q12H    Continuous Infusions:  sodium chloride     ondansetron (ZOFRAN) IV 8 mg (12/15/20 1750)     LOS: 21 days     12/17/20, MD [facility]  To reach me or the doctor on call, go to: [email or web address] Password [email or web address]  12/31/2020, 10:01 AM

## 2021-01-01 MED ORDER — METOPROLOL TARTRATE 25 MG PO TABS
25.0000 mg | ORAL_TABLET | Freq: Two times a day (BID) | ORAL | Status: DC
  Administered 2021-01-01 – 2021-01-26 (×51): 25 mg via ORAL
  Filled 2021-01-01 (×52): qty 1

## 2021-01-01 NOTE — Progress Notes (Signed)
Occupational Therapy Treatment Patient Details Name: Carla Little MRN: [id number] DOB: 05/26/1990 Today's Date: 01/01/2021   History of present illness Patient is a 30 y.o. female who presented to [facility] for intractable N/V on 9/17. ED labwork reveals e. coli UTI. Pt had recent hospital admission from 8/8-9/5 due to Lt knee dislocation which was reduced with fractures of the proximal fibula ligamentous avulsion laterally as well as medially off the medial femoral condyle and MRI showed complete ACL & PCL tears and MCL strain. That hospital admission was complicated by nausea, vomiting, tachycardia, UTI, and fecal impaction;. PMH significant for morbid obesity, GERD, fatty liver disease, depression. Patient now with guillan barre.   OT comments  Treatment focused on purposeful use of upper extremities - promoting strength, ROM and coordination of BUEs. Patient grasped and placed discs in connect 4 like grip with each arm. Patient has exhibited signs of depression and flat affect. Patient has not been able to effectively use her cell phone. Remainder of treatment worked on making cell phone more accessible so patient can make phone calls, open leisure apps for enjoyment. By end of session patient able to successfully make phone call. Will add accessibility and compensatory goals as patient will require multiple compensatory strategies going forward during her recovery.    Recommendations for follow up therapy are one component of a multi-disciplinary discharge planning process, led by the attending physician.  Recommendations may be updated based on patient status, additional functional criteria and insurance authorization.    Follow Up Recommendations  SNF    Equipment Recommendations  None recommended by OT    Recommendations for Other Services      Precautions / Restrictions Precautions Precautions: Fall Precaution Comments: PT Orders from Maureen Ralphs, MD; "WBAT Left LE, only up with a  walker, does not need brace at this standpoint." Also in his note says if probalems with mobility - use brace again. Required Braces or Orthoses: Knee Immobilizer - Left Other Brace: does not need KI at this time but may consider since opatient has more weakness due GBS Restrictions LLE Weight Bearing: Weight bearing as tolerated Other Position/Activity Restrictions: PT Orders from Maureen Ralphs, MD; "WBAT Left LE, only up with a walker, does not need brace at this standpoint"          Vision Patient Visual Report: No change from baseline     Perception     Praxis      Cognition Arousal/Alertness: Awake/alert Behavior During Therapy: WFL for tasks assessed/performed Overall Cognitive Status: Within Functional Limits for tasks assessed                                          Exercises Other Exercises Other Exercises: Worked on fine motor and gross motor coordination with COnnect 4 type game. Worked on [facility] with [employer] fidget toy - today use of thumb. Encouraged use of each finger.   Shoulder Instructions       General Comments      Pertinent Vitals/ Pain       Pain Assessment: No/denies pain  Home Living                                          Prior Functioning/Environment  Frequency  Min 2X/week        Progress Toward Goals  OT Goals(current goals can now be found in the care plan section)  Progress towards OT goals: Progressing toward goals  Acute Rehab OT Goals Patient Stated Goal: To feed herself OT Goal Formulation: With patient Time For Goal Achievement: 01/06/21 Potential to Achieve Goals: Fair  Plan Discharge plan remains appropriate    Co-evaluation          OT goals addressed during session: Strengthening/ROM;Proper use of Adaptive equipment and DME      AM-PAC OT "6 Clicks" Daily Activity     Outcome Measure   Help from another person eating meals?: A Lot Help from another person  taking care of personal grooming?: A Lot Help from another person toileting, which includes using toliet, bedpan, or urinal?: Total Help from another person bathing (including washing, rinsing, drying)?: A Lot Help from another person to put on and taking off regular upper body clothing?: Total Help from another person to put on and taking off regular lower body clothing?: Total 6 Click Score: 9    End of Session    OT Visit Diagnosis: Other abnormalities of gait and mobility (R26.89);History of falling (Z91.81);Pain;Muscle weakness (generalized) (M62.81)   Activity Tolerance Patient tolerated treatment well   Patient Left in bed;with call bell/phone within reach;with bed alarm set   Nurse Communication  (okay to see)        Time: 5427-0623 OT Time Calculation (min): 54 min  Charges: OT General Charges $OT Visit: 1 Visit OT Treatments $Therapeutic Activity: 38-52 mins  Cindy Brindisi, OTR/L Acute Care Rehab Services  Office [phone] Pager: [phone]   Kelli Churn 01/01/2021, 4:14 PM

## 2021-01-01 NOTE — Progress Notes (Addendum)
Patient seen and examined  PROGRESS NOTE  Carla Little YBO:[id number] DOB: September 25, 1990 DOA: 12/09/2020 PCP: Patient, No Pcp Per (Inactive)  HPI/Recap of past 73 hours: 30 year old female with morbid obesity and BMI greater than 85% was admitted on December 09, 2020 due to generalized weakness.  She has Guillain-Barr syndrome status post IVIG she had nausea vomiting which was thought to be due to gastroparesis which has improved with conservative management She is presently waiting for placement.  Subjective: December 30, 2020: Patient seen and examined at bedside.  She is complaining of for poor p.o. intake and requesting for Ensure Denies any nausea or vomiting  December 31, 2020: Patient seen and examined at bedside Complained that she does not have any appetite she requested for Ensure yesterday which was ordered and she is taking it well but she is refusing to eat stating she is not hungry Ask her if she would like me to order her for something for depression she said yes please  January 01, 2021: Patient seen and examined at bedside today she feels better today She however did not take the Remeron last night.  She stated she did not want to wake up in the middle of the night hungry and looking for food.  But that she will take it tonight  Assessment/Plan: Principal Problem:   Intractable vomiting with nausea Active Problems:   Left knee dislocation   PUD (peptic ulcer disease)   Nonalcoholic steatohepatitis (NASH)   Class 3 obesity (HCC)   Sinus tachycardia   Hypokalemia   GERD (gastroesophageal reflux disease)   Iron deficiency anemia due to chronic blood loss   E. coli UTI   Guillain Barr syndrome (HCC) #1 chronic low back pain Continue Dilaudid 0.5 mg every 4 hours as needed and lidocaine patch  2.  Morbid obesity BMI is over 85 needs multidisciplinary approach to her weight loss regimen  3.  Intractable nausea and vomiting which does improve with conservative  management It was thought to be due to gastroparesis or esophagitis and opiate use  4.  Paresthesia upper extremity with generalized Guillain-Barr syndrome.  Patient received 5 days of IVIG Continue physical therapy  5.  Right left knee dislocation status post brace seen by orthopedic who advised 2 to 4 weeks of bracing Physical therapy recommend SNF  6.  Possible depression with loss of appetite I will start her on mirtazapine 15 mg at bedtime  7.  Tachycardia.   Patient has been IV metoprolol due to nausea and vomiting on admission.   Patient is currently able to take p.o. so I will discontinue the IV metoprolol I will switch to metoprolol tartrate 25 mg twice daily  8.  Anemia. Iron level is normal  Code Status: Full  Severity of Illness: The appropriate patient status for this patient is INPATIENT. Inpatient status is judged to be reasonable and necessary in order to provide the required intensity of service to ensure the patient's safety. The patient's presenting symptoms, physical exam findings, and initial radiographic and laboratory data in the context of their chronic comorbidities is felt to place them at high risk for further clinical deterioration. Furthermore, it is not anticipated that the patient will be medically stable for discharge from the hospital within 2 midnights of admission. The following factors support the patient status of inpatient.   " Waiting for placement  * I certify that at the point of admission it is my clinical judgment that the patient will require inpatient hospital care  spanning beyond 2 midnights from the point of admission due to high intensity of service, high risk for further deterioration and high frequency of surveillance required.*   Family Communication: Her friend Leotis Shames is at bedside Disposition Plan:   Status is: Inpatient   Dispo: The patient is from: Home              Anticipated d/c is to: If              Anticipated d/c date  is:               Patient currently not medically stable for discharge  Consultants: Psychiatry Neurology   Procedures: Lumbar puncture  Antimicrobials: None  DVT prophylaxis: SCD   Objective: Vitals:   01/01/21 0106 01/01/21 0412 01/01/21 0439 01/01/21 1407  BP: 134/90 126/85  125/79  Pulse: (!) 108 (!) 110  (!) 106  Resp: 18 16  16   Temp:  98.8 F (37.1 C)  98 F (36.7 C)  TempSrc:  Oral  Oral  SpO2:  99%  94%  Weight:   (!) 200.2 kg   Height:        Intake/Output Summary (Last 24 hours) at 01/01/2021 1540 Last data filed at 12/31/2020 1700 Gross per 24 hour  Intake --  Output 300 ml  Net -300 ml    Filed Weights   12/29/20 0157 12/31/20 0500 01/01/21 0439  Weight: (!) 228 kg (!) 202.7 kg (!) 200.2 kg   Body mass index is 75.76 kg/m.  Exam:  General: 30 y.o. year-old female well developed well nourished in no acute distress.  Alert and oriented x3.  Morbidly obese patient is lying in a bed mattress [place] Cardiovascular: Regular rate and rhythm with no rubs or gallops.  No thyromegaly or JVD noted.   Respiratory: Clear to auscultation with no wheezes or rales. Good inspiratory effort. Abdomen: Soft nontender nondistended with normal bowel sounds x4 quadrants. Musculoskeletal: No lower extremity edema. 2/4 pulses in all 4 extremities. Skin: No ulcerative lesions noted or rashes, Psychiatry: Mood is appropriate for condition and setting Neurology:    Data Reviewed: CBC: Recent Labs  Lab 12/26/20 1536 12/28/20 1531 12/30/20 0936  WBC 6.1 6.6 8.5  HGB 7.9* 8.1* 8.3*  HCT 26.9* 27.8* 28.5*  MCV 108.0* 106.9* 105.6*  PLT 399 461* 582*    Basic Metabolic Panel: Recent Labs  Lab 12/26/20 1536 12/28/20 1531 12/30/20 0936  NA 138 139 141  K 3.9 4.2 4.6  CL 103 100 105  CO2 30 30 29   GLUCOSE 87 92 99  BUN 8 7 8   CREATININE 0.40* <0.30* <0.30*  CALCIUM 8.9 8.7* 9.1    GFR: CrCl cannot be calculated (This lab value cannot be used to  calculate CrCl because it is not a number: <0.30). Liver Function Tests: No results for input(s): AST, ALT, ALKPHOS, BILITOT, PROT, ALBUMIN in the last 168 hours. No results for input(s): LIPASE, AMYLASE in the last 168 hours. No results for input(s): AMMONIA in the last 168 hours. Coagulation Profile: No results for input(s): INR, PROTIME in the last 168 hours. Cardiac Enzymes: No results for input(s): CKTOTAL, CKMB, CKMBINDEX, TROPONINI in the last 168 hours. BNP (last 3 results) No results for input(s): PROBNP in the last 8760 hours. HbA1C: No results for input(s): HGBA1C in the last 72 hours. CBG: No results for input(s): GLUCAP in the last 168 hours. Lipid Profile: No results for input(s): CHOL, HDL, LDLCALC, TRIG, CHOLHDL, LDLDIRECT in the last  72 hours. Thyroid Function Tests: No results for input(s): TSH, T4TOTAL, FREET4, T3FREE, THYROIDAB in the last 72 hours. Anemia Panel: Recent Labs    12/31/20 1417  TIBC 316  IRON 36   Urine analysis:    Component Value Date/Time   COLORURINE AMBER (A) 12/11/2020 0037   APPEARANCEUR CLOUDY (A) 12/11/2020 0037   LABSPEC 1.020 12/11/2020 0037   PHURINE 5.0 12/11/2020 0037   GLUCOSEU NEGATIVE 12/11/2020 0037   HGBUR LARGE (A) 12/11/2020 0037   BILIRUBINUR NEGATIVE 12/11/2020 0037   KETONESUR 5 (A) 12/11/2020 0037   PROTEINUR 100 (A) 12/11/2020 0037   UROBILINOGEN 1.0 10/27/2013 2116   NITRITE NEGATIVE 12/11/2020 0037   LEUKOCYTESUR LARGE (A) 12/11/2020 0037   Sepsis Labs: @LABRCNTIP (procalcitonin:4,lacticidven:4)  ) No results found for this or any previous visit (from the past 240 hour(s)).     Studies: No results found.  Scheduled Meds:  bisacodyl  10 mg Rectal Once   busPIRone  5 mg Oral BID   ciprofloxacin  2 drop Right Eye Q4H while awake   cyanocobalamin  1,000 mcg Intramuscular Weekly   feeding supplement  237 mL Oral BID BM   folic acid  1 mg Oral Daily   gabapentin  100 mg Oral Q8H   lidocaine  1 patch  Transdermal Daily   lidocaine (PF)  20 mL Intradermal Once   magic mouthwash w/lidocaine  5 mL Oral TID AC   metoprolol tartrate  25 mg Oral BID   mirtazapine  15 mg Oral QHS   multivitamin  15 mL Oral Daily   pantoprazole  40 mg Oral BID   polyethylene glycol  17 g Oral Daily   senna-docusate  1 tablet Oral BID   sodium chloride flush  10-40 mL Intracatheter Q12H    Continuous Infusions:  sodium chloride       LOS: 22 days     , MD [facility]  To reach me or the doctor on call, go to: [email or web address] Password [email or web address]  01/01/2021, 3:40 PM

## 2021-01-02 ENCOUNTER — Inpatient Hospital Stay (HOSPITAL_COMMUNITY): Payer: Medicaid Other

## 2021-01-02 LAB — CBC WITH DIFFERENTIAL/PLATELET
Abs Immature Granulocytes: 0.05 10*3/uL (ref 0.00–0.07)
Basophils Absolute: 0 10*3/uL (ref 0.0–0.1)
Basophils Relative: 0 %
Eosinophils Absolute: 0 10*3/uL (ref 0.0–0.5)
Eosinophils Relative: 0 %
HCT: 29.4 % — ABNORMAL LOW (ref 36.0–46.0)
Hemoglobin: 8.5 g/dL — ABNORMAL LOW (ref 12.0–15.0)
Immature Granulocytes: 1 %
Lymphocytes Relative: 22 %
Lymphs Abs: 2.1 10*3/uL (ref 0.7–4.0)
MCH: 30.8 pg (ref 26.0–34.0)
MCHC: 28.9 g/dL — ABNORMAL LOW (ref 30.0–36.0)
MCV: 106.5 fL — ABNORMAL HIGH (ref 80.0–100.0)
Monocytes Absolute: 1 10*3/uL (ref 0.1–1.0)
Monocytes Relative: 10 %
Neutro Abs: 6.7 10*3/uL (ref 1.7–7.7)
Neutrophils Relative %: 67 %
Platelets: 599 10*3/uL — ABNORMAL HIGH (ref 150–400)
RBC: 2.76 MIL/uL — ABNORMAL LOW (ref 3.87–5.11)
RDW: 22.7 % — ABNORMAL HIGH (ref 11.5–15.5)
WBC: 9.9 10*3/uL (ref 4.0–10.5)
nRBC: 0 % (ref 0.0–0.2)

## 2021-01-02 LAB — BLOOD GAS, ARTERIAL
Acid-Base Excess: 6 mmol/L — ABNORMAL HIGH (ref 0.0–2.0)
Bicarbonate: 31.4 mmol/L — ABNORMAL HIGH (ref 20.0–28.0)
Drawn by: 270211
O2 Saturation: 96.5 %
Patient temperature: 98.6
pCO2 arterial: 54.2 mmHg — ABNORMAL HIGH (ref 32.0–48.0)
pH, Arterial: 7.382 (ref 7.350–7.450)
pO2, Arterial: 83.3 mmHg (ref 83.0–108.0)

## 2021-01-02 LAB — URINALYSIS, ROUTINE W REFLEX MICROSCOPIC
Bilirubin Urine: NEGATIVE
Glucose, UA: NEGATIVE mg/dL
Hgb urine dipstick: NEGATIVE
Ketones, ur: NEGATIVE mg/dL
Nitrite: NEGATIVE
Protein, ur: 30 mg/dL — AB
Specific Gravity, Urine: 1.021 (ref 1.005–1.030)
pH: 6 (ref 5.0–8.0)

## 2021-01-02 LAB — BASIC METABOLIC PANEL
Anion gap: 8 (ref 5–15)
BUN: 8 mg/dL (ref 6–20)
CO2: 29 mmol/L (ref 22–32)
Calcium: 8.8 mg/dL — ABNORMAL LOW (ref 8.9–10.3)
Chloride: 97 mmol/L — ABNORMAL LOW (ref 98–111)
Creatinine, Ser: 0.33 mg/dL — ABNORMAL LOW (ref 0.44–1.00)
GFR, Estimated: >60 mL/min (ref >60)
Glucose, Bld: 111 mg/dL — ABNORMAL HIGH (ref 70–99)
Potassium: 3.6 mmol/L (ref 3.5–5.1)
Sodium: 134 mmol/L — ABNORMAL LOW (ref 135–145)

## 2021-01-02 MED ORDER — ENOXAPARIN SODIUM 60 MG/0.6ML IJ SOSY
50.0000 mg | PREFILLED_SYRINGE | Freq: Two times a day (BID) | INTRAMUSCULAR | Status: DC
  Administered 2021-01-02 – 2021-01-26 (×48): 50 mg via SUBCUTANEOUS
  Filled 2021-01-02 (×47): qty 0.6

## 2021-01-02 NOTE — Progress Notes (Signed)
Patient receptive to a visit.  Chaplain explained that Chaplain Genesis could not make it back before the shift changed, and that she requested this chaplain to stop in.   Patient explained some of her medical ordeal and the losses she had known in her life. Several family members, including her parents and grandparents had passed away.  When her mother died, she was raised by an aunt, who then later died when she was 65.  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  "I loved it." "I love helping people." She said she had gotten her friend working at same place and is happy her friend loves her job, but she's sad that she can no longer do it.  She says her greatest disappointment at this time is not being able to walk. "I'm too weak." Chaplain continued to offer support and then offered prayer for patient as she requested it.  Chaplain will be at [facility] again on Friday and said she would visit again.  "I'll be here" she said.  Rev. Lynnell Chad Pager [phone].

## 2021-01-02 NOTE — Progress Notes (Addendum)
Patient seen and examined  PROGRESS NOTE  Carla Little HKV:[id number] DOB: [birth year] DOA: 12/09/2020 PCP: Patient, No Pcp Per (Inactive)  HPI/Recap of past 35 hours: 30 year old female with morbid obesity and BMI greater than 85% was admitted on December 09, 2020 due to generalized weakness.  She has Guillain-Barr syndrome status post IVIG she had nausea vomiting which was thought to be due to gastroparesis which has improved with conservative management She is presently waiting for placement.  Subjective: December 30, 2020: Patient seen and examined at bedside.  She is complaining of for poor p.o. intake and requesting for Ensure Denies any nausea or vomiting  December 31, 2020: Patient seen and examined at bedside Complained that she does not have any appetite she requested for Ensure yesterday which was ordered and she is taking it well but she is refusing to eat stating she is not hungry Ask her if she would like me to order her for something for depression she said yes please  January 01, 2021: Patient seen and examined at bedside today she feels better today She however did not take the Remeron last night.  She stated she did not want to wake up in the middle of the night hungry and looking for food.  But that she will take it tonight  January 02, 2021: Patient seen and examined at bedside Earlier this morning patient had a temperature of 99.8 she was experiencing increased respiration and  heart rate she was also feeling a lot weaker than usual and was not able to participate in her respiratory therapy however her O2 sat was 99% Patient stated that all of her relatives acted she has a few out-of[place]state  Assessment/Plan: Principal Problem:   Intractable vomiting with nausea Active Problems:   Left knee dislocation   PUD (peptic ulcer disease)   Nonalcoholic steatohepatitis (NASH)   Class 3 obesity (HCC)   Sinus tachycardia   Hypokalemia   GERD (gastroesophageal reflux  disease)   Iron deficiency anemia due to chronic blood loss   E. coli UTI   Guillain Barr syndrome (HCC) #1 chronic low back pain Continue Dilaudid 0.5 mg every 4 hours as needed and lidocaine patch  2.  Morbid obesity BMI is over 85 needs multidisciplinary approach to her weight loss regimen  3.  Intractable nausea and vomiting which does improve with conservative management It was thought to be due to gastroparesis or esophagitis and opiate use  4.  Paresthesia upper extremity with generalized Guillain-Barr syndrome.  Patient received 5 days of IVIG Continue physical therapy and Occupational Therapy  5.  Right left knee dislocation status post brace seen by orthopedic who advised 2 to 4 weeks of bracing Physical therapy recommend SNF  6.  Possible depression with loss of appetite I will start her on mirtazapine 15 mg at bedtime  7.  Tachycardia.   Patient has been IV metoprolol due to nausea and vomiting on admission.   Patient is currently able to take p.o. so I will discontinue the IV metoprolol I will switch to metoprolol tartrate 25 mg twice daily  8.  Anemia. Iron level is normal  9.MEWS elevated to yellow: Patient had a episode of shortness of breath with tachycardia mild fever, chest x-ray showed mild vascular congestion and cardiomegaly otherwise no acute abnormality, however hemoglobin still low at 8.1 but not much changed and white counts of within normal Urine and blood cultures are pending ABG shows elevated PCO2 bicarb and elevated base excess but pH is  normal as well as ultrasound Patient was on SCD for DVT prophylaxis.  It has been changed to Lovenox  Code Status: Full  Severity of Illness: The appropriate patient status for this patient is INPATIENT. Inpatient status is judged to be reasonable and necessary in order to provide the required intensity of service to ensure the patient's safety. The patient's presenting symptoms, physical exam findings, and initial  radiographic and laboratory data in the context of their chronic comorbidities is felt to place them at high risk for further clinical deterioration. Furthermore, it is not anticipated that the patient will be medically stable for discharge from the hospital within 2 midnights of admission. The following factors support the patient status of inpatient.   " Waiting for placement  * I certify that at the point of admission it is my clinical judgment that the patient will require inpatient hospital care spanning beyond 2 midnights from the point of admission due to high intensity of service, high risk for further deterioration and high frequency of surveillance required.*   Family Communication: Her friend Leotis Shames is at bedside Disposition Plan:   Status is: Inpatient   Dispo: The patient is from: Home              Anticipated d/c is to: If              Anticipated d/c date is:               Patient currently not medically stable for discharge  Consultants: Psychiatry Neurology   Procedures: Lumbar puncture  Antimicrobials: None  DVT prophylaxis: Lovenox started today   Objective: Vitals:   01/01/21 2013 01/02/21 0646 01/02/21 0843 01/02/21 1051  BP: 119/77 120/70 130/86   Pulse: (!) 102 (!) 118 (!) 111 (!) 117  Resp: 20 (!) 22 17   Temp: 98 F (36.7 C) 99.8 F (37.7 C) 98.7 F (37.1 C)   TempSrc: Oral Oral Oral   SpO2: 99% 94% 98% 99%  Weight:      Height:        Intake/Output Summary (Last 24 hours) at 01/02/2021 1100 Last data filed at 01/02/2021 0704 Gross per 24 hour  Intake 180 ml  Output 650 ml  Net -470 ml    Filed Weights   12/29/20 0157 12/31/20 0500 01/01/21 0439  Weight: (!) 228 kg (!) 202.7 kg (!) 200.2 kg   Body mass index is 75.76 kg/m.  Exam:  General: 30 y.o. year-old female well developed well nourished in no acute distress.  Alert and oriented x3.  Morbidly obese patient is lying in a bed mattress [place] Cardiovascular: Regular rate and  rhythm with no rubs or gallops.  No thyromegaly or JVD noted.   Respiratory: Clear to auscultation with no wheezes or rales. Good inspiratory effort. Abdomen: Soft nontender nondistended with normal bowel sounds x4 quadrants. Musculoskeletal: No lower extremity edema. 2/4 pulses in all 4 extremities. Skin: No ulcerative lesions noted or rashes, Psychiatry: Mood is appropriate for condition and setting Neurology:    Data Reviewed: CBC: Recent Labs  Lab 12/26/20 1536 12/28/20 1531 12/30/20 0936 01/02/21 0908  WBC 6.1 6.6 8.5 9.9  NEUTROABS  --   --   --  6.7  HGB 7.9* 8.1* 8.3* 8.5*  HCT 26.9* 27.8* 28.5* 29.4*  MCV 108.0* 106.9* 105.6* 106.5*  PLT 399 461* 582* 599*    Basic Metabolic Panel: Recent Labs  Lab 12/26/20 1536 12/28/20 1531 12/30/20 0936  NA 138 139 141  K 3.9 4.2 4.6  CL 103 100 105  CO2 30 30 29   GLUCOSE 87 92 99  BUN 8 7 8   CREATININE 0.40* <0.30* <0.30*  CALCIUM 8.9 8.7* 9.1    GFR: CrCl cannot be calculated (This lab value cannot be used to calculate CrCl because it is not a number: <0.30). Liver Function Tests: No results for input(s): AST, ALT, ALKPHOS, BILITOT, PROT, ALBUMIN in the last 168 hours. No results for input(s): LIPASE, AMYLASE in the last 168 hours. No results for input(s): AMMONIA in the last 168 hours. Coagulation Profile: No results for input(s): INR, PROTIME in the last 168 hours. Cardiac Enzymes: No results for input(s): CKTOTAL, CKMB, CKMBINDEX, TROPONINI in the last 168 hours. BNP (last 3 results) No results for input(s): PROBNP in the last 8760 hours. HbA1C: No results for input(s): HGBA1C in the last 72 hours. CBG: No results for input(s): GLUCAP in the last 168 hours. Lipid Profile: No results for input(s): CHOL, HDL, LDLCALC, TRIG, CHOLHDL, LDLDIRECT in the last 72 hours. Thyroid Function Tests: No results for input(s): TSH, T4TOTAL, FREET4, T3FREE, THYROIDAB in the last 72 hours. Anemia Panel: Recent Labs     12/31/20 1417  TIBC 316  IRON 36    Urine analysis:    Component Value Date/Time   COLORURINE AMBER (A) 12/11/2020 0037   APPEARANCEUR CLOUDY (A) 12/11/2020 0037   LABSPEC 1.020 12/11/2020 0037   PHURINE 5.0 12/11/2020 0037   GLUCOSEU NEGATIVE 12/11/2020 0037   HGBUR LARGE (A) 12/11/2020 0037   BILIRUBINUR NEGATIVE 12/11/2020 0037   KETONESUR 5 (A) 12/11/2020 0037   PROTEINUR 100 (A) 12/11/2020 0037   UROBILINOGEN 1.0 10/27/2013 2116   NITRITE NEGATIVE 12/11/2020 0037   LEUKOCYTESUR LARGE (A) 12/11/2020 0037   Sepsis Labs: @LABRCNTIP (procalcitonin:4,lacticidven:4)  ) No results found for this or any previous visit (from the past 240 hour(s)).     Studies: No results found.  Scheduled Meds:  bisacodyl  10 mg Rectal Once   busPIRone  5 mg Oral BID   ciprofloxacin  2 drop Right Eye Q4H while awake   cyanocobalamin  1,000 mcg Intramuscular Weekly   enoxaparin (LOVENOX) injection  50 mg Subcutaneous Q12H   feeding supplement  237 mL Oral BID BM   folic acid  1 mg Oral Daily   gabapentin  100 mg Oral Q8H   lidocaine  1 patch Transdermal Daily   lidocaine (PF)  20 mL Intradermal Once   magic mouthwash w/lidocaine  5 mL Oral TID AC   metoprolol tartrate  25 mg Oral BID   mirtazapine  15 mg Oral QHS   multivitamin  15 mL Oral Daily   pantoprazole  40 mg Oral BID   polyethylene glycol  17 g Oral Daily   senna-docusate  1 tablet Oral BID   sodium chloride flush  10-40 mL Intracatheter Q12H    Continuous Infusions:  sodium chloride       LOS: 23 days     12/13/2020, MD [facility]  To reach me or the doctor on call, go to: [email or web address] Password [email or web address]  01/02/2021, 11:00 AM

## 2021-01-02 NOTE — Progress Notes (Addendum)
   01/02/21 0646  Assess: MEWS Score  Temp 99.8 F (37.7 C)  BP 120/70  Pulse Rate (!) 118  Resp (!) 22  Level of Consciousness Alert  SpO2 94 %  O2 Device Nasal Cannula  O2 Flow Rate (L/min) 2 L/min  Assess: MEWS Score  MEWS Temp 0  MEWS Systolic 0  MEWS Pulse 2  MEWS RR 1  MEWS LOC 0  MEWS Score 3  MEWS Score Color Yellow  Assess: if the MEWS score is Yellow or Red  Were vital signs taken at a resting state? Yes  Focused Assessment Change from prior assessment (see assessment flowsheet) (temp is elevated; pulse has been elevated)  Does the patient meet 2 or more of the SIRS criteria? Yes  Does the patient have a confirmed or suspected source of infection? No  Provider and Rapid Response Notified? Yes  MEWS guidelines implemented *See Row Information* Yes  Treat  MEWS Interventions Administered prn meds/treatments;Escalated (See documentation below)  Take Vital Signs  Increase Vital Sign Frequency  Yellow: Q 2hr X 2 then Q 4hr X 2, if remains yellow, continue Q 4hrs  Escalate  MEWS: Escalate Yellow: discuss with charge nurse/RN and consider discussing with provider and RRT  Notify: Charge Nurse/RN  Name of Charge Nurse/RN Notified Vera, RN  Date Charge Nurse/RN Notified 01/02/21  Time Charge Nurse/RN Notified [id number]  Notify: Provider  Provider Name/Title Dr. Barrie Folk  Date Provider Notified 01/02/21  Time Provider Notified [phone]  Notification Type Page  Notification Reason Change in status;Other (Comment) (Yellow MEWS)  Provider response En route  Document  Patient Outcome Other (Comment) (pt given tylenol and oncoming RN notified)  Progress note created (see row info) Yes  Assess: SIRS CRITERIA  SIRS Temperature  0  SIRS Pulse 1  SIRS Respirations  1  SIRS WBC 0  SIRS Score Sum  2   Pt given tylenol for elevated temperature of 99.8. MD paged, and oncoming RN notified.

## 2021-01-02 NOTE — Progress Notes (Signed)
Chaplain engaged in a brief check-in with Carla Little who shared about having a fever earlier in the morning.  She stated she was feeling tired but was thankful for Chaplain coming in to see her.  Chaplain will follow-up later today after she has rested.    01/02/21 1000  Clinical Encounter Type  Visited With Patient  Visit Type Follow-up

## 2021-01-02 NOTE — Progress Notes (Signed)
Pt says she cant do her NIF/FVC due to shortness of breath. Her spo2 is 99%. Paged MD for abg order.

## 2021-01-02 NOTE — Progress Notes (Signed)
Physical Therapy Treatment Patient Details Name: Carla Little MRN: [id number] DOB: 1990-04-25 Today's Date: 01/02/2021   History of Present Illness Patient is a 30 y.o. female who presented to [facility] for intractable N/V on 9/17. ED labwork reveals e. coli UTI. Pt had recent hospital admission from 8/8-9/5 due to Lt knee dislocation which was reduced with fractures of the proximal fibula ligamentous avulsion laterally as well as medially off the medial femoral condyle and MRI showed complete ACL & PCL tears and MCL strain. That hospital admission was complicated by nausea, vomiting, tachycardia, UTI, and fecal impaction;. PMH significant for morbid obesity, GERD, fatty liver disease, depression. Patient now with guillan barre.    PT Comments     Patient required assistance  for rolling and being cleaned  up. Patient assisted with LE exercises. Patient's leg strength continues to test  low such that  patient cannot safely stand.  Patient  is noted to have much edema about the legs and arms( much more so than when  patient was in hospital 2 months ago.).  Continue with tilt bed  assistance for sitting and attempted WB through  legs.  Recommendations for follow up therapy are one component of a multi-disciplinary discharge planning process, led by the attending physician.  Recommendations may be updated based on patient status, additional functional criteria and insurance authorization.  Follow Up Recommendations     SNF   Equipment Recommendations       Recommendations for Other Services       Precautions / Restrictions Precautions Precautions: Fall Precaution Comments: PT Orders from Maureen Ralphs, MD; "WBAT Left LE, only up with a walker, does not need brace at this standpoint." Also in his note says if probalems with mobility - use brace again.     Mobility  Bed Mobility   Bed Mobility: Rolling Rolling: Max assist;Total assist;+2 for physical assistance;+2 for safety/equipment          General bed mobility comments: patient's bed souiled. worked on patient reaching to each rail to assist with rolling both  ways.    Transfers                    Ambulation/Gait                 Stairs             Wheelchair Mobility    Modified Rankin (Stroke Patients Only)       Balance                                            Cognition Arousal/Alertness: Awake/alert Behavior During Therapy: Flat affect                                   General Comments: patient asking therapist when will she be able to stnad, wants to try sitting then standing witht he walker. Explained that she will need to work on sitting and then assess leg strngth      Exercises General Exercises - Lower Extremity Ankle Circles/Pumps: AROM;Both;10 reps Heel Slides: Right;10 reps;AAROM    General Comments        Pertinent Vitals/Pain Pain Score: 8  Pain Location: low back Pain Descriptors / Indicators: Grimacing;Discomfort Pain Intervention(s): Monitored during session;Premedicated before session;RN gave pain  meds during session    Home Living                      Prior Function            PT Goals (current goals can now be found in the care plan section) Progress towards PT goals: Progressing toward goals    Frequency           PT Plan Current plan remains appropriate    Co-evaluation              AM-PAC PT "6 Clicks" Mobility   Outcome Measure                   End of Session Equipment Utilized During Treatment: Gait belt Activity Tolerance: Patient tolerated treatment well Patient left: in bed;with call bell/phone within reach Nurse Communication: Mobility status;Need for lift equipment PT Visit Diagnosis: Muscle weakness (generalized) (M62.81)     Time: 8250-0370 PT Time Calculation (min) (ACUTE ONLY): 45 min  Charges:  $Therapeutic Exercise: 23-37 mins $Self Care/Home  Management: 8-22                     Blanchard Kelch PT Acute Rehabilitation Services Pager [phone] Office [phone]    Rada Hay 01/02/2021, 5:17 PM

## 2021-01-03 ENCOUNTER — Inpatient Hospital Stay (HOSPITAL_COMMUNITY): Payer: Medicaid Other

## 2021-01-03 LAB — BLOOD CULTURE ID PANEL (REFLEXED) - BCID2

## 2021-01-03 LAB — CBC WITH DIFFERENTIAL/PLATELET
Abs Immature Granulocytes: 0.03 10*3/uL (ref 0.00–0.07)
Basophils Absolute: 0 10*3/uL (ref 0.0–0.1)
Basophils Relative: 0 %
Eosinophils Absolute: 0 10*3/uL (ref 0.0–0.5)
Eosinophils Relative: 0 %
HCT: 29.7 % — ABNORMAL LOW (ref 36.0–46.0)
Hemoglobin: 8.7 g/dL — ABNORMAL LOW (ref 12.0–15.0)
Immature Granulocytes: 0 %
Lymphocytes Relative: 24 %
Lymphs Abs: 2 10*3/uL (ref 0.7–4.0)
MCH: 30.9 pg (ref 26.0–34.0)
MCHC: 29.3 g/dL — ABNORMAL LOW (ref 30.0–36.0)
MCV: 105.3 fL — ABNORMAL HIGH (ref 80.0–100.0)
Monocytes Absolute: 0.9 10*3/uL (ref 0.1–1.0)
Monocytes Relative: 10 %
Neutro Abs: 5.4 10*3/uL (ref 1.7–7.7)
Neutrophils Relative %: 66 %
Platelets: 562 10*3/uL — ABNORMAL HIGH (ref 150–400)
RBC: 2.82 MIL/uL — ABNORMAL LOW (ref 3.87–5.11)
RDW: 22.2 % — ABNORMAL HIGH (ref 11.5–15.5)
WBC: 8.4 10*3/uL (ref 4.0–10.5)
nRBC: 0.2 % (ref 0.0–0.2)

## 2021-01-03 LAB — COMPREHENSIVE METABOLIC PANEL
ALT: 106 U/L — ABNORMAL HIGH (ref 0–44)
AST: 343 U/L — ABNORMAL HIGH (ref 15–41)
Albumin: 2.7 g/dL — ABNORMAL LOW (ref 3.5–5.0)
Alkaline Phosphatase: 81 U/L (ref 38–126)
Anion gap: 7 (ref 5–15)
BUN: 8 mg/dL (ref 6–20)
CO2: 30 mmol/L (ref 22–32)
Calcium: 8.7 mg/dL — ABNORMAL LOW (ref 8.9–10.3)
Chloride: 98 mmol/L (ref 98–111)
Creatinine, Ser: 0.33 mg/dL — ABNORMAL LOW (ref 0.44–1.00)
GFR, Estimated: >60 mL/min (ref >60)
Glucose, Bld: 104 mg/dL — ABNORMAL HIGH (ref 70–99)
Potassium: 3.7 mmol/L (ref 3.5–5.1)
Sodium: 135 mmol/L (ref 135–145)
Total Bilirubin: 1.7 mg/dL — ABNORMAL HIGH (ref 0.3–1.2)
Total Protein: 8 g/dL (ref 6.5–8.1)

## 2021-01-03 MED ORDER — CALCIUM CARBONATE ANTACID 500 MG PO CHEW
1.0000 | CHEWABLE_TABLET | Freq: Two times a day (BID) | ORAL | Status: DC
  Administered 2021-01-03 – 2021-01-04 (×2): 200 mg via ORAL
  Filled 2021-01-03 (×2): qty 1

## 2021-01-03 MED ORDER — METOPROLOL TARTRATE 5 MG/5ML IV SOLN
5.0000 mg | Freq: Once | INTRAVENOUS | Status: AC
  Administered 2021-01-03: 5 mg via INTRAVENOUS
  Filled 2021-01-03: qty 5

## 2021-01-03 NOTE — Progress Notes (Signed)
Physical Therapy Treatment Patient Details Name: Carla Little MRN: [id number] DOB: 11-01-1990 Today's Date: 01/03/2021   History of Present Illness Patient is a 30 y.o. female who presented to [facility] for intractable N/V on 9/17. ED labwork reveals e. coli UTI. Pt had recent hospital admission from 8/8-9/5 due to Lt knee dislocation which was reduced with fractures of the proximal fibula ligamentous avulsion laterally as well as medially off the medial femoral condyle and MRI showed complete ACL & PCL tears and MCL strain. That hospital admission was complicated by nausea, vomiting, tachycardia, UTI, and fecal impaction;. PMH significant for morbid obesity, GERD, fatty liver disease, depression. Patient now with guillan barre.    PT Comments    Assisted with LE exercsies in supine.  Restested sensation. Patient reports more sensation to LT and proprioception on right lower leg, less proximal leg compared to right, does have slight increased strength in  ankle and knee extension.  When rolling and mobilizing , patient noted without control of either leg  to slide legs back to midline, the legs migrate out to bed edges, lacks adduction strength to return toward midline. . At times each leg is abducted and hanging partially off bed when side extensions of mattress not extended.  Continue with exercises and mobility as safe and tolerated.  Recommendations for follow up therapy are one component of a multi-disciplinary discharge planning process, led by the attending physician.  Recommendations may be updated based on patient status, additional functional criteria and insurance authorization.  Follow Up Recommendations  SNF     Equipment Recommendations  Wheelchair cushion (measurements PT);Wheelchair (measurements PT)    Recommendations for Other Services       Precautions / Restrictions Precautions Precautions: Fall Precaution Comments: PT Orders from Maureen Ralphs, MD; "WBAT Left LE, only  up with a walker, does not need brace at this standpoint." Also in his note says if probalems with mobility - use brace again. Other Brace: does not need KI at this time but may consider since opatient has more weakness due GBS     Mobility  Bed Mobility   Bed Mobility: Rolling Rolling: Max assist;Total assist;+2 for physical assistance;+2 for safety/equipment         General bed mobility comments: encouraged patient to reach for rails when rolling to each side for bed pads to be changed.   Transfers                    Ambulation/Gait                 Stairs             Wheelchair Mobility    Modified Rankin (Stroke Patients Only)       Balance                                            Cognition Arousal/Alertness: Awake/alert Behavior During Therapy: Flat affect                                          Exercises      General Comments        Pertinent Vitals/Pain Faces Pain Scale: Hurts even more Pain Location: low back, L knee Pain Descriptors / Indicators: Grimacing;Discomfort Pain  Intervention(s): Patient requesting pain meds-RN notified    Home Living                      Prior Function            PT Goals (current goals can now be found in the care plan section) Progress towards PT goals: Progressing toward goals    Frequency    Min 2X/week      PT Plan Current plan remains appropriate    Co-evaluation              AM-PAC PT "6 Clicks" Mobility   Outcome Measure  Help needed turning from your back to your side while in a flat bed without using bedrails?: Total Help needed moving from lying on your back to sitting on the side of a flat bed without using bedrails?: Total Help needed moving to and from a bed to a chair (including a wheelchair)?: Total Help needed standing up from a chair using your arms (e.g., wheelchair or bedside chair)?: Total Help needed to walk  in hospital room?: Total Help needed climbing 3-5 steps with a railing? : Total 6 Click Score: 6    End of Session   Activity Tolerance: Patient tolerated treatment well Patient left: in bed;with call bell/phone within reach Nurse Communication: Mobility status;Need for lift equipment PT Visit Diagnosis: Muscle weakness (generalized) (M62.81) Pain - Right/Left: Left Pain - part of body: Knee     Time: 3704-8889 PT Time Calculation (min) (ACUTE ONLY): 35 min  Charges:  $Therapeutic Activity: 8-22 mins $Neuromuscular Re-education: 23-37 mins $Self Care/Home Management: 8-22                     Blanchard Kelch PT Acute Rehabilitation Services Pager [phone] Office [phone]    Rada Hay 01/03/2021, 5:01 PM

## 2021-01-03 NOTE — Progress Notes (Signed)
Patient seen and examined  PROGRESS NOTE  Carla Little WCB:[id number] DOB: [birth year] DOA: 12/09/2020 PCP: Patient, No Pcp Per (Inactive)  HPI/Recap of past 74 hours: 30 year old female with morbid obesity and BMI greater than 85% was admitted on December 09, 2020 due to generalized weakness.  She has Guillain-Barr syndrome status post IVIG she had nausea vomiting which was thought to be due to gastroparesis which has improved with conservative management She is presently waiting for placement.  Subjective: December 30, 2020: Patient seen and examined at bedside.  She is complaining of for poor p.o. intake and requesting for Ensure Denies any nausea or vomiting  December 31, 2020: Patient seen and examined at bedside Complained that she does not have any appetite she requested for Ensure yesterday which was ordered and she is taking it well but she is refusing to eat stating she is not hungry Ask her if she would like me to order her for something for depression she said yes please  January 01, 2021: Patient seen and examined at bedside today she feels better today She however did not take the Remeron last night.  She stated she did not want to wake up in the middle of the night hungry and looking for food.  But that she will take it tonight  January 02, 2021: Patient seen and examined at bedside Earlier this morning patient had a temperature of 99.8 she was experiencing increased respiration and  heart rate she was also feeling a lot weaker than usual and was not able to participate in her respiratory therapy however her O2 sat was 99% Patient stated that all of her relatives acted she has a few out-of[place]state  January 03, 2021: Patient seen and examined at bedside: She stated she feels a little better denies any shortness of breath or fever or palpitation..  She is attempting to participate in physical therapy patient has not been out of bed she is just getting in bed physical therapy  still severe lower extremity weakness Respiratory therapy is also seeing her regularly  Assessment/Plan: Principal Problem:   Intractable vomiting with nausea Active Problems:   Left knee dislocation   PUD (peptic ulcer disease)   Nonalcoholic steatohepatitis (NASH)   Class 3 obesity (HCC)   Sinus tachycardia   Hypokalemia   GERD (gastroesophageal reflux disease)   Iron deficiency anemia due to chronic blood loss   E. coli UTI   Guillain Barr syndrome (HCC) #1 chronic low back pain Continue Dilaudid 0.5 mg every 4 hours as needed and lidocaine patch  2.  Morbid obesity BMI is over 85 needs multidisciplinary approach to her weight loss regimen  3.  Intractable nausea and vomiting resolved with conservative management It was thought to be due to gastroparesis or esophagitis and opiate use  4.  Paresthesia upper extremity with generalized Guillain-Barr syndrome.  Patient received 5 days of IVIG Continue physical therapy and Occupational Therapy  5.  Right left knee dislocation status post brace seen by orthopedic who advised 2 to 4 weeks of bracing Physical therapy recommend SNF  6.  Possible depression with loss of appetite I will start her on mirtazapine 15 mg at bedtime this can be increased to 30 mg in about a week  7.  Tachycardia.  Chronic  Patient has been IV metoprolol due to nausea and vomiting on admission.   Patient is currently able to take p.o. so I will discontinue the IV metoprolol I will switch to metoprolol tartrate 25 mg twice  daily  8.  Anemia. Hemoglobin is 8.7 today Iron level is normal  9.shortness of breath is resolved.  Yesterday her MEWS elevated to yellow: Patient had a episode of shortness of breath with tachycardia mild fever, chest x-ray showed mild vascular congestion and cardiomegaly otherwise no acute abnormality, however hemoglobin still low at 8.1 but not much changed and white counts of within normal Urine and blood cultures are  pending ABG shows elevated PCO2 bicarb and elevated base excess but pH is normal as well as ultrasound Patient was on SCD for DVT prophylaxis.  It has been changed to Lovenox  10.  Protein energy malnutrition.  Her albumin is 2.7 we will get nutrition consult  11.  Transaminasemia.  Her ALT is 343 ALT is 106 total bili is 1.7.  Patient denies any abdominal pain. Will obtain abdominal ultrasound and hepatitis profile  Hypocalcemia.  Her calcium is steadily declining. We will start her on Tums.  Code Status: Full  Severity of Illness: The appropriate patient status for this patient is INPATIENT. Inpatient status is judged to be reasonable and necessary in order to provide the required intensity of service to ensure the patient's safety. The patient's presenting symptoms, physical exam findings, and initial radiographic and laboratory data in the context of their chronic comorbidities is felt to place them at high risk for further clinical deterioration. Furthermore, it is not anticipated that the patient will be medically stable for discharge from the hospital within 2 midnights of admission. The following factors support the patient status of inpatient.   " Waiting for placement  * I certify that at the point of admission it is my clinical judgment that the patient will require inpatient hospital care spanning beyond 2 midnights from the point of admission due to high intensity of service, high risk for further deterioration and high frequency of surveillance required.*   Family Communication: Her friend Leotis Shames is at bedside Disposition Plan:   Status is: Inpatient   Dispo: The patient is from: Home              Anticipated d/c is to: If              Anticipated d/c date is:               Patient currently not medically stable for discharge  Consultants: Psychiatry Neurology   Procedures: Lumbar puncture  Antimicrobials: None  DVT prophylaxis: Lovenox started  today   Objective: Vitals:   01/02/21 1640 01/02/21 2103 01/03/21 0338 01/03/21 1318  BP: 114/66 125/66 125/73 120/73  Pulse: (!) 103 (!) 111 (!) 105 (!) 104  Resp: 20 20 20 18   Temp: 98.3 F (36.8 C) 98.7 F (37.1 C) 98.5 F (36.9 C) 99 F (37.2 C)  TempSrc:  Oral Axillary Oral  SpO2: 99% 99% 98% 91%  Weight:  (!) 194.1 kg    Height:        Intake/Output Summary (Last 24 hours) at 01/03/2021 1758 Last data filed at 01/03/2021 0339 Gross per 24 hour  Intake --  Output 200 ml  Net -200 ml    Filed Weights   12/31/20 0500 01/01/21 0439 01/02/21 2103  Weight: (!) 202.7 kg (!) 200.2 kg (!) 194.1 kg   Body mass index is 73.47 kg/m.  Exam:  General: 30 y.o. year-old female well developed well nourished in no acute distress.  Alert and oriented x3.  Morbidly obese patient is lying in a bed mattress [place] Cardiovascular: Regular rate  and rhythm with no rubs or gallops.  No thyromegaly or JVD noted.   Respiratory: Clear to auscultation with no wheezes or rales. Good inspiratory effort. Abdomen: Soft nontender nondistended with normal bowel sounds x4 quadrants. Musculoskeletal: No lower extremity edema. 2/4 pulses in all 4 extremities. Skin: No ulcerative lesions noted or rashes, Psychiatry: Mood is appropriate for condition and setting Neurology:    Data Reviewed: CBC: Recent Labs  Lab 12/28/20 1531 12/30/20 0936 01/02/21 0908 01/03/21 0448  WBC 6.6 8.5 9.9 8.4  NEUTROABS  --   --  6.7 5.4  HGB 8.1* 8.3* 8.5* 8.7*  HCT 27.8* 28.5* 29.4* 29.7*  MCV 106.9* 105.6* 106.5* 105.3*  PLT 461* 582* 599* 562*    Basic Metabolic Panel: Recent Labs  Lab 12/28/20 1531 12/30/20 0936 01/02/21 1730 01/03/21 0448  NA 139 141 134* 135  K 4.2 4.6 3.6 3.7  CL 100 105 97* 98  CO2 30 29 29 30   GLUCOSE 92 99 111* 104*  BUN 7 8 8 8   CREATININE <0.30* <0.30* 0.33* 0.33*  CALCIUM 8.7* 9.1 8.8* 8.7*    GFR: Estimated Creatinine Clearance: 177.7 mL/min (A) (by C-G  formula based on SCr of 0.33 mg/dL (L)). Liver Function Tests: Recent Labs  Lab 01/03/21 0448  AST 343*  ALT 106*  ALKPHOS 81  BILITOT 1.7*  PROT 8.0  ALBUMIN 2.7*   No results for input(s): LIPASE, AMYLASE in the last 168 hours. No results for input(s): AMMONIA in the last 168 hours. Coagulation Profile: No results for input(s): INR, PROTIME in the last 168 hours. Cardiac Enzymes: No results for input(s): CKTOTAL, CKMB, CKMBINDEX, TROPONINI in the last 168 hours. BNP (last 3 results) No results for input(s): PROBNP in the last 8760 hours. HbA1C: No results for input(s): HGBA1C in the last 72 hours. CBG: No results for input(s): GLUCAP in the last 168 hours. Lipid Profile: No results for input(s): CHOL, HDL, LDLCALC, TRIG, CHOLHDL, LDLDIRECT in the last 72 hours. Thyroid Function Tests: No results for input(s): TSH, T4TOTAL, FREET4, T3FREE, THYROIDAB in the last 72 hours. Anemia Panel: No results for input(s): VITAMINB12, FOLATE, FERRITIN, TIBC, IRON, RETICCTPCT in the last 72 hours.  Urine analysis:    Component Value Date/Time   COLORURINE AMBER (A) 01/02/2021 1720   APPEARANCEUR CLOUDY (A) 01/02/2021 1720   LABSPEC 1.021 01/02/2021 1720   PHURINE 6.0 01/02/2021 1720   GLUCOSEU NEGATIVE 01/02/2021 1720   HGBUR NEGATIVE 01/02/2021 1720   BILIRUBINUR NEGATIVE 01/02/2021 1720   KETONESUR NEGATIVE 01/02/2021 1720   PROTEINUR 30 (A) 01/02/2021 1720   UROBILINOGEN 1.0 10/27/2013 2116   NITRITE NEGATIVE 01/02/2021 1720   LEUKOCYTESUR SMALL (A) 01/02/2021 1720   Sepsis Labs: @LABRCNTIP (procalcitonin:4,lacticidven:4)  ) Recent Results (from the past 240 hour(s))  Culture, blood (routine x 2)     Status: None (Preliminary result)   Collection Time: 01/02/21  9:08 AM   Specimen: BLOOD LEFT HAND  Result Value Ref Range Status   Specimen Description   Final    BLOOD LEFT HAND Performed at [facility], [address], [place], [place] [address]     Special Requests   Final    BOTTLES DRAWN AEROBIC ONLY Blood Culture adequate volume Performed at [facility], [address] [place], [place] [address]    Culture  Setup Time   Final    GRAM POSITIVE COCCI IN CLUSTERS AEROBIC BOTTLE ONLY CRITICAL RESULT CALLED TO, READ BACK BY AND VERIFIED WITH: PHARMD DREW W.  [id number] FCP Performed at [facility], [address], [place], [place] [address]    Culture GRAM POSITIVE COCCI  Final   Report Status PENDING  Incomplete  Culture, blood (routine x 2)     Status: None (Preliminary result)   Collection Time: 01/02/21  9:08 AM   Specimen: BLOOD RIGHT HAND  Result Value Ref Range Status   Specimen Description   Final    BLOOD RIGHT HAND Performed at [facility], [address], [place], [place] [address]    Special Requests   Final    BOTTLES DRAWN AEROBIC AND ANAEROBIC Blood Culture adequate volume Performed at [facility], [address], [place], [place] [address]    Culture   Final    NO GROWTH < 24 HOURS Performed at [facility], [address] [place], [place] [address]    Report Status PENDING  Incomplete  Blood Culture ID Panel (Reflexed)     Status: Abnormal   Collection Time: 01/02/21  9:08 AM  Result Value Ref Range Status   Enterococcus faecalis NOT DETECTED NOT DETECTED Final   Enterococcus Faecium NOT DETECTED NOT DETECTED Final   Listeria monocytogenes NOT DETECTED NOT DETECTED Final   Staphylococcus species DETECTED (A) NOT DETECTED Final    Comment: CRITICAL RESULT CALLED TO, READ BACK BY AND VERIFIED WITH: PHARMD DREW W. [id number] FCP    Staphylococcus aureus (BCID) NOT DETECTED NOT DETECTED Final   Staphylococcus epidermidis DETECTED (A) NOT DETECTED Final    Comment: Methicillin (oxacillin) resistant coagulase negative staphylococcus. Possible blood culture contaminant (unless isolated from more than one blood culture draw or clinical  case suggests pathogenicity). No antibiotic treatment is indicated for blood  culture contaminants. CRITICAL RESULT CALLED TO, READ BACK BY AND VERIFIED WITH: PHARMD DREW W. [phone] [id number] FCP    Staphylococcus lugdunensis NOT DETECTED NOT DETECTED Final   Streptococcus species NOT DETECTED NOT DETECTED Final   Streptococcus agalactiae NOT DETECTED NOT DETECTED Final   Streptococcus pneumoniae NOT DETECTED NOT DETECTED Final   Streptococcus pyogenes NOT DETECTED NOT DETECTED Final   A.calcoaceticus-baumannii NOT DETECTED NOT DETECTED Final   Bacteroides fragilis NOT DETECTED NOT DETECTED Final   Enterobacterales NOT DETECTED NOT DETECTED Final   Enterobacter cloacae complex NOT DETECTED NOT DETECTED Final   Escherichia coli NOT DETECTED NOT DETECTED Final   Klebsiella aerogenes NOT DETECTED NOT DETECTED Final   Klebsiella oxytoca NOT DETECTED NOT DETECTED Final   Klebsiella pneumoniae NOT DETECTED NOT DETECTED Final   Proteus species NOT DETECTED NOT DETECTED Final   Salmonella species NOT DETECTED NOT DETECTED Final   Serratia marcescens NOT DETECTED NOT DETECTED Final   Haemophilus influenzae NOT DETECTED NOT DETECTED Final   Neisseria meningitidis NOT DETECTED NOT DETECTED Final   Pseudomonas aeruginosa NOT DETECTED NOT DETECTED Final   Stenotrophomonas maltophilia NOT DETECTED NOT DETECTED Final   Candida albicans NOT DETECTED NOT DETECTED Final   Candida auris NOT DETECTED NOT DETECTED Final   Candida glabrata NOT DETECTED NOT DETECTED Final   Candida krusei NOT DETECTED NOT DETECTED Final   Candida parapsilosis NOT DETECTED NOT DETECTED Final   Candida tropicalis NOT DETECTED NOT DETECTED Final   Cryptococcus neoformans/gattii NOT DETECTED NOT DETECTED Final   Methicillin resistance mecA/C DETECTED (A) NOT DETECTED Final    Comment: CRITICAL RESULT CALLED TO, READ BACK BY AND VERIFIED WITH: PHARMD DREW W. [phone] FCP Performed at [facility], [address],  [place], [place] [address]        Studies: No results found.  Scheduled Meds:  bisacodyl  10 mg Rectal Once   busPIRone  5 mg Oral BID   ciprofloxacin  2 drop Right Eye Q4H while awake   cyanocobalamin  1,000 mcg Intramuscular Weekly   enoxaparin (LOVENOX) injection  50 mg Subcutaneous Q12H   feeding supplement  237 mL Oral BID BM   folic acid  1 mg Oral Daily   gabapentin  100 mg Oral Q8H   lidocaine  1 patch Transdermal Daily   lidocaine (PF)  20 mL Intradermal Once   magic mouthwash w/lidocaine  5 mL Oral TID AC   metoprolol tartrate  25 mg Oral BID   mirtazapine  15 mg Oral QHS   multivitamin  15 mL Oral Daily   pantoprazole  40 mg Oral BID   polyethylene glycol  17 g Oral Daily   senna-docusate  1 tablet Oral BID   sodium chloride flush  10-40 mL Intracatheter Q12H    Continuous Infusions:  sodium chloride       LOS: 24 days     Myrtie Neither, MD [facility]  To reach me or the doctor on call, go to: [email or web address] Password [facility]  01/03/2021, 5:58 PM

## 2021-01-03 NOTE — Progress Notes (Signed)
PHARMACY - PHYSICIAN COMMUNICATION CRITICAL VALUE ALERT - BLOOD CULTURE IDENTIFICATION (BCID)  Carla Little is an 30 y.o. female who presented to [facility] on 12/09/2020 with a chief complaint of intractable N/V  Assessment:  Day 25 of admission. BCx ordered 10/10 with BCID result 1 of 3 bottles with Staph epi, MecA resistance noted  Name of physician (or Provider) Contacted: Dr Barrie Folk  Current antibiotics: none, consider contaminant  Changes to prescribed antibiotics recommended:  Recommendations accepted by provider  Results for orders placed or performed during the hospital encounter of 12/09/20  Blood Culture ID Panel (Reflexed) (Collected: 01/02/2021  9:08 AM)  Result Value Ref Range   Enterococcus faecalis NOT DETECTED NOT DETECTED   Enterococcus Faecium NOT DETECTED NOT DETECTED   Listeria monocytogenes NOT DETECTED NOT DETECTED   Staphylococcus species DETECTED (A) NOT DETECTED   Staphylococcus aureus (BCID) NOT DETECTED NOT DETECTED   Staphylococcus epidermidis DETECTED (A) NOT DETECTED   Staphylococcus lugdunensis NOT DETECTED NOT DETECTED   Streptococcus species NOT DETECTED NOT DETECTED   Streptococcus agalactiae NOT DETECTED NOT DETECTED   Streptococcus pneumoniae NOT DETECTED NOT DETECTED   Streptococcus pyogenes NOT DETECTED NOT DETECTED   A.calcoaceticus-baumannii NOT DETECTED NOT DETECTED   Bacteroides fragilis NOT DETECTED NOT DETECTED   Enterobacterales NOT DETECTED NOT DETECTED   Enterobacter cloacae complex NOT DETECTED NOT DETECTED   Escherichia coli NOT DETECTED NOT DETECTED   Klebsiella aerogenes NOT DETECTED NOT DETECTED   Klebsiella oxytoca NOT DETECTED NOT DETECTED   Klebsiella pneumoniae NOT DETECTED NOT DETECTED   Proteus species NOT DETECTED NOT DETECTED   Salmonella species NOT DETECTED NOT DETECTED   Serratia marcescens NOT DETECTED NOT DETECTED   Haemophilus influenzae NOT DETECTED NOT DETECTED   Neisseria meningitidis NOT DETECTED NOT  DETECTED   Pseudomonas aeruginosa NOT DETECTED NOT DETECTED   Stenotrophomonas maltophilia NOT DETECTED NOT DETECTED   Candida albicans NOT DETECTED NOT DETECTED   Candida auris NOT DETECTED NOT DETECTED   Candida glabrata NOT DETECTED NOT DETECTED   Candida krusei NOT DETECTED NOT DETECTED   Candida parapsilosis NOT DETECTED NOT DETECTED   Candida tropicalis NOT DETECTED NOT DETECTED   Cryptococcus neoformans/gattii NOT DETECTED NOT DETECTED   Methicillin resistance mecA/C DETECTED (A) NOT DETECTED    Otho Bellows PharmD 01/03/2021  11:09 AM

## 2021-01-03 NOTE — Plan of Care (Signed)

## 2021-01-03 NOTE — Progress Notes (Signed)
Physical Therapy Treatment Patient Details Name: Carla Little MRN: [id number] DOB: June 25, 1990 Today's Date: 01/03/2021   History of Present Illness Patient is a 30 y.o. female who presented to [facility] for intractable N/V on 9/17. ED labwork reveals e. coli UTI. Pt had recent hospital admission from 8/8-9/5 due to Lt knee dislocation which was reduced with fractures of the proximal fibula ligamentous avulsion laterally as well as medially off the medial femoral condyle and MRI showed complete ACL & PCL tears and MCL strain. That hospital admission was complicated by nausea, vomiting, tachycardia, UTI, and fecal impaction;. PMH significant for morbid obesity, GERD, fatty liver disease, depression. Patient now with guillan barre.    PT Comments    Patient positioned on bed and began to place bed in chair position. Patient unable to maintain left leg on the bed, abducted. Patient began to slide to foot of bed. Bed placed bed back in trendelenburg and repostioned on bed. Did not attempt any more sitting. Assisted with UE exercises and control.    Recommendations for follow up therapy are one component of a multi-disciplinary discharge planning process, led by the attending physician.  Recommendations may be updated based on patient status, additional functional criteria and insurance authorization.  Follow Up Recommendations        Equipment Recommendations  Wheelchair cushion (measurements PT);Wheelchair (measurements PT)    Recommendations for Other Services       Precautions / Restrictions Precautions Precautions: Fall Precaution Comments: PT Orders from Maureen Ralphs, MD; "WBAT Left LE, only up with a walker, does not need brace at this standpoint." Also in his note says if probalems with mobility - use brace again. Other Brace: does not need KI at this time but may consider since opatient has more weakness due GBS     Mobility  Bed Mobility   Bed Mobility: Rolling Rolling: Max  assist;Total assist;+2 for physical assistance;+2 for safety/equipment         General bed mobility comments: placed in semi bed chair position, patient began to slide to end so aborted further sitting. Assisted with cleaning and changing bed pads.    Transfers                    Ambulation/Gait                 Stairs             Wheelchair Mobility    Modified Rankin (Stroke Patients Only)       Balance                                            Cognition Arousal/Alertness: Awake/alert Behavior During Therapy: Flat affect                                          Exercises      General Comments        Pertinent Vitals/Pain Faces Pain Scale: Hurts even more Pain Location: low back, L knee Pain Descriptors / Indicators: Grimacing;Discomfort Pain Intervention(s): Patient requesting pain meds-RN notified    Home Living                      Prior Function  PT Goals (current goals can now be found in the care plan section) Progress towards PT goals: Progressing toward goals    Frequency    Min 2X/week      PT Plan Current plan remains appropriate    Co-evaluation              AM-PAC PT "6 Clicks" Mobility   Outcome Measure  Help needed turning from your back to your side while in a flat bed without using bedrails?: Total Help needed moving from lying on your back to sitting on the side of a flat bed without using bedrails?: Total Help needed moving to and from a bed to a chair (including a wheelchair)?: Total Help needed standing up from a chair using your arms (e.g., wheelchair or bedside chair)?: Total Help needed to walk in hospital room?: Total Help needed climbing 3-5 steps with a railing? : Total 6 Click Score: 6    End of Session   Activity Tolerance: Treatment limited secondary to medical complications (Comment) Patient left: in bed;with call bell/phone  within reach Nurse Communication: Mobility status;Need for lift equipment PT Visit Diagnosis: Muscle weakness (generalized) (M62.81) Pain - Right/Left: Left Pain - part of body: Knee     Time: 4917-9150 PT Time Calculation (min) (ACUTE ONLY): 44 min  Charges:  $Therapeutic Activity: 8-22 mins $Neuromuscular Re-education: 8-22 mins $Self Care/Home Management: 8-22                     Blanchard Kelch PT Acute Rehabilitation Services Pager [phone] Office [phone]    Rada Hay 01/03/2021, 4:57 PM

## 2021-01-03 NOTE — Progress Notes (Addendum)
   01/03/21 2139  Assess: MEWS Score  Temp 98.4 F (36.9 C)  BP 117/76  Pulse Rate (!) 125  Resp 18  SpO2 98 % (After on nasal cannula with 2L, O2went up to 98%. McKayla Moore 01/03/21)  O2 Device Nasal Cannula  O2 Flow Rate (L/min) 2 L/min  Assess: MEWS Score  MEWS Temp 0  MEWS Systolic 0  MEWS Pulse 2  MEWS RR 0  MEWS LOC 0  MEWS Score 2  MEWS Score Color Yellow  Assess: if the MEWS score is Yellow or Red  Were vital signs taken at a resting state? Yes  Focused Assessment No change from prior assessment  Does the patient meet 2 or more of the SIRS criteria? No  Does the patient have a confirmed or suspected source of infection? No  Provider and Rapid Response Notified? No  MEWS guidelines implemented *See Row Information* Yes  Treat  MEWS Interventions Administered prn meds/treatments;Other (Comment) (Pt NPO for abd [place], on call switched PO metoprolol for IV)  Pain Scale 0-10  Pain Score 0  Breathing 0  Negative Vocalization 0  Facial Expression 0  Body Language 0  Consolability 0  PAINAD Score 0  Take Vital Signs  Increase Vital Sign Frequency  Yellow: Q 2hr X 2 then Q 4hr X 2, if remains yellow, continue Q 4hrs  Escalate  MEWS: Escalate Yellow: discuss with charge nurse/RN and consider discussing with provider and RRT  Notify: Charge Nurse/RN  Name of Charge Nurse/RN Notified Brayton Caves RN  Date Charge Nurse/RN Notified 01/03/21  Time Charge Nurse/RN Notified 2218  Assess: SIRS CRITERIA  SIRS Temperature  0  SIRS Pulse 1  SIRS Respirations  0  SIRS WBC 0  SIRS Score Sum  1   HR WNL after interventions

## 2021-01-03 NOTE — Progress Notes (Signed)
NIF = -60

## 2021-01-04 LAB — HEPATIC FUNCTION PANEL
ALT: 121 U/L — ABNORMAL HIGH (ref 0–44)
AST: 406 U/L — ABNORMAL HIGH (ref 15–41)
Albumin: 2.7 g/dL — ABNORMAL LOW (ref 3.5–5.0)
Alkaline Phosphatase: 89 U/L (ref 38–126)
Bilirubin, Direct: 0.6 mg/dL — ABNORMAL HIGH (ref 0.0–0.2)
Indirect Bilirubin: 0.9 mg/dL (ref 0.3–0.9)
Total Bilirubin: 1.5 mg/dL — ABNORMAL HIGH (ref 0.3–1.2)
Total Protein: 8.4 g/dL — ABNORMAL HIGH (ref 6.5–8.1)

## 2021-01-04 LAB — PROTIME-INR
INR: 1 (ref 0.8–1.2)
Prothrombin Time: 13.5 seconds (ref 11.4–15.2)

## 2021-01-04 MED ORDER — GABAPENTIN 250 MG/5ML PO SOLN
300.0000 mg | Freq: Three times a day (TID) | ORAL | Status: DC
  Administered 2021-01-04 – 2021-01-05 (×5): 300 mg via ORAL
  Filled 2021-01-04 (×7): qty 6

## 2021-01-04 MED ORDER — ENSURE ENLIVE PO LIQD
237.0000 mL | Freq: Three times a day (TID) | ORAL | Status: DC
  Administered 2021-01-04 – 2021-01-18 (×26): 237 mL via ORAL

## 2021-01-04 MED ORDER — METHOCARBAMOL 500 MG PO TABS
750.0000 mg | ORAL_TABLET | Freq: Four times a day (QID) | ORAL | Status: DC | PRN
  Administered 2021-01-04 – 2021-01-25 (×33): 750 mg via ORAL
  Filled 2021-01-04 (×39): qty 2

## 2021-01-04 MED ORDER — OXYCODONE HCL 5 MG/5ML PO SOLN
5.0000 mg | ORAL | Status: DC | PRN
  Administered 2021-01-04: 5 mg via ORAL
  Administered 2021-01-04 – 2021-01-13 (×27): 10 mg via ORAL
  Administered 2021-01-13: 5 mg via ORAL
  Administered 2021-01-14 – 2021-01-20 (×29): 10 mg via ORAL
  Administered 2021-01-21: 5 mg via ORAL
  Administered 2021-01-21 – 2021-01-26 (×21): 10 mg via ORAL
  Filled 2021-01-04 (×9): qty 10
  Filled 2021-01-04: qty 5
  Filled 2021-01-04: qty 10
  Filled 2021-01-04: qty 5
  Filled 2021-01-04 (×22): qty 10
  Filled 2021-01-04: qty 5
  Filled 2021-01-04 (×47): qty 10

## 2021-01-04 MED ORDER — DIPHENHYDRAMINE HCL 12.5 MG/5ML PO ELIX
25.0000 mg | ORAL_SOLUTION | Freq: Four times a day (QID) | ORAL | Status: DC | PRN
  Administered 2021-01-05 – 2021-01-07 (×3): 25 mg via ORAL
  Filled 2021-01-04 (×3): qty 10

## 2021-01-04 NOTE — Progress Notes (Signed)
   01/04/21 1009  Assess: MEWS Score  Temp 98.1 F (36.7 C)  BP 137/85  Pulse Rate (!) 115  Resp 17  SpO2 100 %  O2 Device Nasal Cannula  Assess: MEWS Score  MEWS Temp 0  MEWS Systolic 0  MEWS Pulse 2  MEWS RR 0  MEWS LOC 0  MEWS Score 2  MEWS Score Color Yellow  Assess: if the MEWS score is Yellow or Red  Were vital signs taken at a resting state? Yes  Does the patient meet 2 or more of the SIRS criteria? No  Does the patient have a confirmed or suspected source of infection? No  Provider and Rapid Response Notified? Yes  MEWS guidelines implemented *See Row Information* Yes  Treat  MEWS Interventions Other (Comment) (continue to monitor patient)  Take Vital Signs  Increase Vital Sign Frequency  Yellow: Q 2hr X 2 then Q 4hr X 2, if remains yellow, continue Q 4hrs  Escalate  MEWS: Escalate Yellow: discuss with charge nurse/RN and consider discussing with provider and RRT  Notify: Provider  Provider Name/Title Dr.Nettey  Date Provider Notified 01/04/21  Time Provider Notified 0945  Notification Type Rounds  Notification Reason Other (Comment) (elevated heart rate)  Provider response Other (Comment) (will round on pt and discuss care)  Assess: SIRS CRITERIA  SIRS Temperature  0  SIRS Pulse 1  SIRS Respirations  0  SIRS WBC 0  SIRS Score Sum  1  Pt completed yellow  MEWS and new yellow MEWS restarted. No change in previous assessment and pt remains chronic pain and rapid heartbeat.

## 2021-01-04 NOTE — Progress Notes (Signed)
Physical Therapy Treatment Patient Details Name: Carla Little MRN: [id number] DOB: 08-18-1990 Today's Date: 01/04/2021   History of Present Illness Patient is a 30 y.o. female who presented to [facility] for intractable N/V on 9/17. ED labwork reveals e. coli UTI. Pt had recent hospital admission from 8/8-9/5 due to Lt knee dislocation which was reduced with fractures of the proximal fibula ligamentous avulsion laterally as well as medially off the medial femoral condyle and MRI showed complete ACL & PCL tears and MCL strain. That hospital admission was complicated by nausea, vomiting, tachycardia, UTI, and fecal impaction;. PMH significant for morbid obesity, GERD, fatty liver disease, depression. Patient now with Guillain -barre.syndrome.    PT Comments    The patient  worked in sitting position in bed for trunk control and stretching, worked on UE control to hold rails without dropping hands. Patient sat upright  x 25 minutes, leaned back to rest then 10 more minutes.  Continue attempts for tilted standing and sitting.    Recommendations for follow up therapy are one component of a multi-disciplinary discharge planning process, led by the attending physician.  Recommendations may be updated based on patient status, additional functional criteria and insurance authorization.  Follow Up Recommendations  SNF     Equipment Recommendations  Wheelchair cushion (measurements PT);Wheelchair (measurements PT)    Recommendations for Other Services       Precautions / Restrictions Precautions Precautions: Fall Precaution Comments: Back pain.  go slow with sitting in  somewhat chair position, can slide  down, be sure legs and feet are secure to foot board with folded blankets Other Brace: does not need KI at this time but may consider since opatient has more weakness due GBS Restrictions LLE Weight Bearing: Weight bearing as tolerated Other Position/Activity Restrictions: PT Orders from Maureen Ralphs, MD; "WBAT Left LE, only up with a walker, does not need brace at this standpoint"     Mobility  Bed Mobility               General bed mobility comments: placed tilt bed strap  across thighs, placed patient in semi chair position . No issues with sliding    Transfers                    Ambulation/Gait                 Stairs             Wheelchair Mobility    Modified Rankin (Stroke Patients Only)       Balance Overall balance assessment: Needs assistance Sitting-balance support: Bilateral upper extremity supported;No upper extremity supported   Sitting balance - Comments: bed placed in chair position,  Patient worked on leaning forward, weight shifting, reaching down  towards feet. Patient sat x ~. Patient assisted to sitting forward with no back support,  intermittent cues to lean forward, . worked on Assisted trunk rotations x 5                                    Cognition Arousal/Alertness: Awake/alert Behavior During Therapy: WFL for tasks assessed/performed Overall Cognitive Status: Within Functional Limits for tasks assessed  Exercises Other Exercises Other Exercises: Worked on motor control using phone and use of voice accessibility features. Continue to have difficulty with phone features limiting independence. Will continue to work on ease of use.    General Comments        Pertinent Vitals/Pain Pain Assessment: Faces Faces Pain Scale: Hurts even more Pain Location: back Pain Descriptors / Indicators: Grimacing;Discomfort Pain Intervention(s): Limited activity within patient's tolerance;RN gave pain meds during session (lidoderm patch)    Home Living                      Prior Function            PT Goals (current goals can now be found in the care plan section) Acute Rehab PT Goals Patient Stated Goal: To feed herself Progress  towards PT goals: Progressing toward goals    Frequency    Min 2X/week      PT Plan Current plan remains appropriate    Co-evaluation PT/OT/SLP Co-Evaluation/Treatment: Yes Reason for Co-Treatment: Complexity of the patient's impairments (multi-system involvement);For patient/therapist safety PT goals addressed during session: Mobility/safety with mobility OT goals addressed during session: ADL's and self-care      AM-PAC PT "6 Clicks" Mobility   Outcome Measure  Help needed turning from your back to your side while in a flat bed without using bedrails?: A Lot Help needed moving from lying on your back to sitting on the side of a flat bed without using bedrails?: A Lot Help needed moving to and from a bed to a chair (including a wheelchair)?: Total Help needed standing up from a chair using your arms (e.g., wheelchair or bedside chair)?: Total Help needed to walk in hospital room?: Total Help needed climbing 3-5 steps with a railing? : Total 6 Click Score: 8    End of Session   Activity Tolerance: Patient tolerated treatment well Patient left: in bed;with call bell/phone within reach Nurse Communication: Mobility status;Need for lift equipment PT Visit Diagnosis: Muscle weakness (generalized) (M62.81)     Time: 7628-3151 PT Time Calculation (min) (ACUTE ONLY): 80 min  Charges:  $Therapeutic Activity: 23-37 mins                     Blanchard Kelch PT Acute Rehabilitation Services Pager [phone] Office [phone]    Rada Hay 01/04/2021, 4:42 PM

## 2021-01-04 NOTE — Progress Notes (Signed)
PROGRESS NOTE    Carla Little  IZT:[id number] DOB: [birth year] DOA: 12/09/2020 PCP: Patient, No Pcp Per (Inactive)   Brief Narrative: Carla Little is a 30 y.o. female with a history of morbid obesity, recent left knee dislocation. Patient presented secondary to weakness with presentation concerning for Guillain Barre syndrome. Neurology consulted and patient started on IVIG. She was also found to have vitamin B12 and folate deficiencies. Patient is not awaiting rehab options for discharge.   Assessment & Plan:   Principal Problem:   Intractable vomiting with nausea Active Problems:   Left knee dislocation   PUD (peptic ulcer disease)   Nonalcoholic steatohepatitis (NASH)   Class 3 obesity (HCC)   Sinus tachycardia   Hypokalemia   GERD (gastroesophageal reflux disease)   Iron deficiency anemia due to chronic blood loss   E. coli UTI   Guillain Barr syndrome (HCC)   Guillain Barre syndrome Neuropathy Neurology consulted and recommended IVIG, for which patient completed 5 day course. Also recommended treating confounding diagnoses of vitamin B12 and folate deficiency.  -Increase to gabapentin 300 mg TID for paraesthesias -Neurology (signed off 10/2): Vitamin B12 qweek for 4 weeks, then monthly Vitamin B12 injections.  Intractable nausea and vomiting Resolved.  Depression/anxiety Evaluated by psychiatry -Continue Remeron and Buspar  Shortness of breath Transient. Resolved.  Left knee dislocation Patient previously evaluated by orthopedic surgery with recommendation for hinge brace and to allow for joint scarring. Currently weight bearing as tolerated with limited flexion of left knee.  Sinus tachycardia This has been managed with metoprolol. Unsure of etiology. Possibly related to deconditioning. No concerning symptoms.  Iron deficiency anemia Related to heavy menstruation. Hemoglobin stable.  Elevated AST/ALT NAFLD AST/ALT continue to trend upwards. No  associated symptoms. Abdominal ultrasound (10/11) significant for fatty liver disease. -Hepatic function panel/INR today  B12 deficiency Folate deficiency B12 and folate of 153 and 3.2 respectively. Patient started on Vitamin B12 and folic acid supplementation  E. Coli UTI Completed treatment with Ceftriaxone and transitioned to Cefazolin.  Hypocalcemia Mild and likely related to hypoalbuminemia. -Discontinue Tums  Possible oral candidiasis Treated with Diflucan IV. Seems to be resolved.  Positive blood culture result 1/4 samples significant for staphylococcus epidermidis. Unlikely active infection.  Morbid obesity Body mass index is 74.93 kg/m. Dietitian consulted. -Dietitian recommendations (10/12): Liberalize diet to regular to promote oral intake Will request food be chopped into bite sized pieces and dining services alert nursing staff when tray is delivered Nursing staff to assist with set-up and feeeding of meals Increase Ensure Enlive po to TID, each supplement provides 350 kcal and 20 grams of protein Continue MVI regimen  DVT prophylaxis: Lovenox Code Status:   Code Status: Full Code Family Communication: None at bedside Disposition Plan: Discharge to SNF when bed is available if remains stable.   Consultants:  Neurology Psychiatry Gastroenterology  Procedures:  None  Antimicrobials: Ceftriaxone Ancef Diflucan Flagyl    Subjective: Back pain. Paresthesias of bilateral legs. No appetite  Objective: Vitals:   01/04/21 0500 01/04/21 0541 01/04/21 1009 01/04/21 1208  BP:  129/77 137/85 110/66  Pulse:  (!) 104 (!) 115 (!) 107  Resp:  16 17   Temp:  97.9 F (36.6 C) 98.1 F (36.7 C) 98.7 F (37.1 C)  TempSrc:    Oral  SpO2:  97% 100% 99%  Weight: (!) 198 kg     Height:        Intake/Output Summary (Last 24 hours) at 01/04/2021 1338 Last data  filed at 01/03/2021 1700 Gross per 24 hour  Intake 0 ml  Output --  Net 0 ml   Filed Weights    01/01/21 0439 01/02/21 2103 01/04/21 0500  Weight: (!) 200.2 kg (!) 194.1 kg (!) 198 kg    Examination:  General exam: Appears calm and comfortable  Respiratory system: Clear to auscultation. Respiratory effort normal. Cardiovascular system: S1 & S2 heard, RRR. No murmurs, rubs, gallops or clicks. Gastrointestinal system: Abdomen is nondistended, soft and nontender. No organomegaly or masses felt. Normal bowel sounds heard. Central nervous system: Alert and oriented. No focal neurological deficits. Musculoskeletal: No calf tenderness Skin: No cyanosis. No rashes Psychiatry: Judgement and insight appear normal. Flat affect.     Data Reviewed: I have personally reviewed following labs and imaging studies  CBC Lab Results  Component Value Date   WBC 8.4 01/03/2021   RBC 2.82 (L) 01/03/2021   HGB 8.7 (L) 01/03/2021   HCT 29.7 (L) 01/03/2021   MCV 105.3 (H) 01/03/2021   MCH 30.9 01/03/2021   PLT 562 (H) 01/03/2021   MCHC 29.3 (L) 01/03/2021   RDW 22.2 (H) 01/03/2021   LYMPHSABS 2.0 01/03/2021   MONOABS 0.9 01/03/2021   EOSABS 0.0 01/03/2021   BASOSABS 0.0 01/03/2021     Last metabolic panel Lab Results  Component Value Date   NA 135 01/03/2021   K 3.7 01/03/2021   CL 98 01/03/2021   CO2 30 01/03/2021   BUN 8 01/03/2021   CREATININE 0.33 (L) 01/03/2021   GLUCOSE 104 (H) 01/03/2021   GFRNONAA >60 01/03/2021   GFRAA >60 05/16/2018   CALCIUM 8.7 (L) 01/03/2021   PHOS 2.9 12/14/2020   PROT 8.0 01/03/2021   ALBUMIN 2.7 (L) 01/03/2021   BILITOT 1.7 (H) 01/03/2021   ALKPHOS 81 01/03/2021   AST 343 (H) 01/03/2021   ALT 106 (H) 01/03/2021   ANIONGAP 7 01/03/2021    CBG (last 3)  No results for input(s): GLUCAP in the last 72 hours.   GFR: Estimated Creatinine Clearance: 180.2 mL/min (A) (by C-G formula based on SCr of 0.33 mg/dL (L)).  Coagulation Profile: No results for input(s): INR, PROTIME in the last 168 hours.  Recent Results (from the past 240 hour(s))   Culture, blood (routine x 2)     Status: Abnormal (Preliminary result)   Collection Time: 01/02/21  9:08 AM   Specimen: BLOOD LEFT HAND  Result Value Ref Range Status   Specimen Description   Final    BLOOD LEFT HAND Performed at [facility], [address], [place], [place] [address]    Special Requests   Final    BOTTLES DRAWN AEROBIC ONLY Blood Culture adequate volume Performed at [facility], [address], [place], [place] [address]    Culture  Setup Time   Final    GRAM POSITIVE COCCI IN CLUSTERS AEROBIC BOTTLE ONLY CRITICAL RESULT CALLED TO, READ BACK BY AND VERIFIED WITH: PHARMD DREW W. [phone] FCP    Culture (A)  Final    STAPHYLOCOCCUS EPIDERMIDIS THE SIGNIFICANCE OF ISOLATING THIS ORGANISM FROM A SINGLE SET OF BLOOD CULTURES WHEN MULTIPLE SETS ARE DRAWN IS UNCERTAIN. PLEASE NOTIFY THE MICROBIOLOGY DEPARTMENT WITHIN ONE WEEK IF SPECIATION AND SENSITIVITIES ARE REQUIRED. Performed at [facility], [address] [place], [place] [address]    Report Status PENDING  Incomplete  Culture, blood (routine x 2)     Status: None (Preliminary result)   Collection Time: 01/02/21  9:08 AM   Specimen: BLOOD RIGHT HAND  Result Value Ref Range Status   Specimen Description   Final    BLOOD RIGHT HAND Performed at [facility], [address], [place], [place] [address]    Special Requests   Final    BOTTLES DRAWN AEROBIC AND ANAEROBIC Blood Culture adequate volume Performed at [facility], [address] [place], [place] [address]    Culture   Final    NO GROWTH 2 DAYS Performed at [facility], [address] [place], [place] [address]    Report Status PENDING  Incomplete  Blood Culture ID Panel (Reflexed)     Status: Abnormal   Collection Time: 01/02/21  9:08 AM  Result Value Ref Range Status   Enterococcus faecalis NOT DETECTED NOT DETECTED Final   Enterococcus Faecium NOT  DETECTED NOT DETECTED Final   Listeria monocytogenes NOT DETECTED NOT DETECTED Final   Staphylococcus species DETECTED (A) NOT DETECTED Final    Comment: CRITICAL RESULT CALLED TO, READ BACK BY AND VERIFIED WITH: PHARMD DREW W. [phone] FCP    Staphylococcus aureus (BCID) NOT DETECTED NOT DETECTED Final   Staphylococcus epidermidis DETECTED (A) NOT DETECTED Final    Comment: Methicillin (oxacillin) resistant coagulase negative staphylococcus. Possible blood culture contaminant (unless isolated from more than one blood culture draw or clinical case suggests pathogenicity). No antibiotic treatment is indicated for blood  culture contaminants. CRITICAL RESULT CALLED TO, READ BACK BY AND VERIFIED WITH: PHARMD DREW W. [phone] FCP    Staphylococcus lugdunensis NOT DETECTED NOT DETECTED Final   Streptococcus species NOT DETECTED NOT DETECTED Final   Streptococcus agalactiae NOT DETECTED NOT DETECTED Final   Streptococcus pneumoniae NOT DETECTED NOT DETECTED Final   Streptococcus pyogenes NOT DETECTED NOT DETECTED Final   A.calcoaceticus-baumannii NOT DETECTED NOT DETECTED Final   Bacteroides fragilis NOT DETECTED NOT DETECTED Final   Enterobacterales NOT DETECTED NOT DETECTED Final   Enterobacter cloacae complex NOT DETECTED NOT DETECTED Final   Escherichia coli NOT DETECTED NOT DETECTED Final   Klebsiella aerogenes NOT DETECTED NOT DETECTED Final   Klebsiella oxytoca NOT DETECTED NOT DETECTED Final   Klebsiella pneumoniae NOT DETECTED NOT DETECTED Final   Proteus species NOT DETECTED NOT DETECTED Final   Salmonella species NOT DETECTED NOT DETECTED Final   Serratia marcescens NOT DETECTED NOT DETECTED Final   Haemophilus influenzae NOT DETECTED NOT DETECTED Final   Neisseria meningitidis NOT DETECTED NOT DETECTED Final   Pseudomonas aeruginosa NOT DETECTED NOT DETECTED Final   Stenotrophomonas maltophilia NOT DETECTED NOT DETECTED Final   Candida albicans NOT DETECTED NOT DETECTED  Final   Candida auris NOT DETECTED NOT DETECTED Final   Candida glabrata NOT DETECTED NOT DETECTED Final   Candida krusei NOT DETECTED NOT DETECTED Final   Candida parapsilosis NOT DETECTED NOT DETECTED Final   Candida tropicalis NOT DETECTED NOT DETECTED Final   Cryptococcus neoformans/gattii NOT DETECTED NOT DETECTED Final   Methicillin resistance mecA/C DETECTED (A) NOT DETECTED Final    Comment: CRITICAL RESULT CALLED TO, READ BACK BY AND VERIFIED WITH: PHARMD DREW W. [phone] FCP Performed at [facility], [address], [place], [place] [address]         Radiology Studies: US Abdomen Limited RUQ (LIVER/GB)  Result Date: 01/03/2021 CLINICAL DATA:  Elevated liver function tests EXAM: ULTRASOUND ABDOMEN LIMITED RIGHT UPPER QUADRANT COMPARISON:  12/13/2020 FINDINGS: Gallbladder: Layering sludge is seen within the gallbladder, similar to prior  examination. The gallbladder, however, is not distended, there is no gallbladder wall thickening, and no pericholecystic fluid is identified. The sonographic Eulah Pont sign is reportedly negative. Common bile duct: Diameter: 3 mm in proximal diameter Liver: Hepatic parenchymal echogenicity is markedly increased diffusely and there is poor acoustic through transmission in keeping with changes of moderate to severe hepatic steatosis. This appears similar to prior examination. No focal intrahepatic masses are seen and there is no intrahepatic biliary ductal dilation. Portal vein is patent on color Doppler imaging with normal direction of blood flow towards the liver. Other: None. IMPRESSION: Stable examination. Cholelithiasis without sonographic evidence of acute cholecystitis. Moderate to severe hepatic steatosis. Electronically Signed   By: Helyn Numbers M.D.   On: 01/03/2021 22:55        Scheduled Meds:  bisacodyl  10 mg Rectal Once   busPIRone  5 mg Oral BID   calcium carbonate  1 tablet Oral BID   cyanocobalamin  1,000 mcg  Intramuscular Weekly   enoxaparin (LOVENOX) injection  50 mg Subcutaneous Q12H   feeding supplement  237 mL Oral BID BM   folic acid  1 mg Oral Daily   gabapentin  300 mg Oral Q8H   lidocaine  1 patch Transdermal Daily   lidocaine (PF)  20 mL Intradermal Once   magic mouthwash w/lidocaine  5 mL Oral TID AC   metoprolol tartrate  25 mg Oral BID   mirtazapine  15 mg Oral QHS   multivitamin  15 mL Oral Daily   pantoprazole  40 mg Oral BID   polyethylene glycol  17 g Oral Daily   senna-docusate  1 tablet Oral BID   sodium chloride flush  10-40 mL Intracatheter Q12H   Continuous Infusions:  sodium chloride       LOS: 25 days     Jacquelin Hawking, MD [facility] 01/04/2021, 1:38 PM  If 7PM-7AM, please contact night-coverage [email or web address]

## 2021-01-04 NOTE — Progress Notes (Signed)
Occupational Therapy Treatment Patient Details Name: Carla Little MRN: [id number] DOB: 1991-01-21 Today's Date: 01/04/2021   History of present illness Patient is a 30 y.o. female who presented to [facility] for intractable N/V on 9/17. ED labwork reveals e. coli UTI. Pt had recent hospital admission from 8/8-9/5 due to Lt knee dislocation which was reduced with fractures of the proximal fibula ligamentous avulsion laterally as well as medially off the medial femoral condyle and MRI showed complete ACL & PCL tears and MCL strain. That hospital admission was complicated by nausea, vomiting, tachycardia, UTI, and fecal impaction;. PMH significant for morbid obesity, GERD, fatty liver disease, depression. Patient now with guillan barre.   OT comments  Treatment focused on self feeding and grooming and continued use of accessibility features on phone to allow for voice controls. Patient able to pierce food with right hand but then bring food to mouth with left hand. Significant amount of time needed for chewing and swallowing. Patient needed max assist for oral care and mod assist for washing face and hands. Cont POC.   Recommendations for follow up therapy are one component of a multi-disciplinary discharge planning process, led by the attending physician.  Recommendations may be updated based on patient status, additional functional criteria and insurance authorization.    Follow Up Recommendations  SNF    Equipment Recommendations  None recommended by OT    Recommendations for Other Services      Precautions / Restrictions Precautions Precautions: Fall Precaution Comments: Back pain.  go slow with sitting in  somewhat chair position, can slide  down, be sure legs and feet are secure to foot board with folded blankets Other Brace: does not need KI at this time but may consider since opatient has more weakness due GBS Restrictions LLE Weight Bearing: Weight bearing as tolerated Other  Position/Activity Restrictions: PT Orders from Maureen Ralphs, MD; "WBAT Left LE, only up with a walker, does not need brace at this standpoint"       Mobility Bed Mobility                    Transfers                      Balance                                           ADL either performed or assessed with clinical judgement   ADL Overall ADL's : Needs assistance/impaired Eating/Feeding: Bed level;Minimal assistance Eating/Feeding Details (indicate cue type and reason): Patient used built up spoon to feed herself. Patient able to pierce food with right hand - but switches fork to left hand to bring to mouth due to improved dexterity on left compared to right. Patient's bed limits the ability of food/items to be placed in front of her. Therapist stabilized plate and assist minimally with feeding when food dropped. Grooming: Oral care;Bed level;Maximal assistance;Wash/dry face;Wash/dry hands Grooming Details (indicate cue type and reason): Patient able to use yonger to assist with getting particles out of her mouth. Assistance needed to brush teeth and get larger chicken pieces out of her mouth. Mod assist to wash face. Mod assist to wash hands with quality.  Vision Baseline Vision/History: 0 No visual deficits     Perception     Praxis      Cognition Arousal/Alertness: Awake/alert Behavior During Therapy: WFL for tasks assessed/performed Overall Cognitive Status: Within Functional Limits for tasks assessed                                          Exercises Other Exercises Other Exercises: Worked on motor control using phone and use of voice accessibility features. Continue to have difficulty with phone features limiting independence. Will continue to work on ease of use.   Shoulder Instructions       General Comments      Pertinent Vitals/ Pain       Pain Assessment:  Faces Faces Pain Scale: Hurts even more Pain Location: back Pain Descriptors / Indicators: Grimacing;Discomfort Pain Intervention(s): Limited activity within patient's tolerance;RN gave pain meds during session (lidoderm patch)  Home Living                                          Prior Functioning/Environment              Frequency  Min 2X/week        Progress Toward Goals  OT Goals(current goals can now be found in the care plan section)  Progress towards OT goals: Goals drowngraded-see care plan  Acute Rehab OT Goals Patient Stated Goal: To feed herself OT Goal Formulation: With patient Time For Goal Achievement: 01/15/21 Potential to Achieve Goals: Fair  Plan Discharge plan remains appropriate    Co-evaluation      Reason for Co-Treatment: Complexity of the patient's impairments (multi-system involvement);For patient/therapist safety PT goals addressed during session: Mobility/safety with mobility OT goals addressed during session: ADL's and self-care      AM-PAC OT "6 Clicks" Daily Activity     Outcome Measure   Help from another person eating meals?: A Little Help from another person taking care of personal grooming?: A Lot Help from another person toileting, which includes using toliet, bedpan, or urinal?: Total Help from another person bathing (including washing, rinsing, drying)?: A Lot Help from another person to put on and taking off regular upper body clothing?: Total Help from another person to put on and taking off regular lower body clothing?: Total 6 Click Score: 10    End of Session Equipment Utilized During Treatment: Rolling walker  OT Visit Diagnosis: Other abnormalities of gait and mobility (R26.89);History of falling (Z91.81);Pain;Muscle weakness (generalized) (M62.81)   Activity Tolerance Patient tolerated treatment well   Patient Left in bed;with call bell/phone within reach;with bed alarm set   Nurse  Communication Patient requests pain meds        Time: 6659-9357 OT Time Calculation (min): 78 min  Charges: OT General Charges $OT Visit: 1 Visit OT Treatments $Self Care/Home Management : 23-37 mins $Therapeutic Activity: 8-22 mins  Tristen Pennino, OTR/L Acute Care Rehab Services  Office [phone] Pager: [phone]   Kelli Churn 01/04/2021, 4:36 PM

## 2021-01-04 NOTE — Progress Notes (Signed)
Speech Language Pathology Treatment:    Patient Details Name: Carla Little MRN: [id number] DOB: 25-Sep-1990 Today's Date: 01/04/2021 Time: 1705-1730 SLP Time Calculation (min) (ACUTE ONLY): 25 min  Assessment / Plan / Recommendation Clinical Impression  Pt observed to address dysphagia goals including po intake of solids - using strategies for oral clearance.  Per RN, pt consumed chicken strips today - with goal of consuming 2 of 3.  SLP checked tray and only few small bites remained.  Pt reports she is "full" as she consumed chicken, Ensure and gingerale. She was participative however and willingly consumed graham cracker bolus x1, icecream x4 bites and gingerale via straw (self feeding using foam built up spoon).    Started po intake with liquids to moisten oral cavity - followed by ice cream and finally solids *dipped in icecream.  Initially pt with marginally prolonged oral holding with solids - however within approx 10 seconds, she demonstrated rotary mastication pattern with only minimal right lateral retention - adhered largely to dentition.    Use of liquid swallow, liquid swish and swallow and finally oral suction effective to clear retention with min verbal cues. Pt desired to cease po after only a few bites - but SLP encouraged at her effort and participation today.    With direction questioning, pt admits to the following reasons for poor po intake :  depression with her situation, feeling full, not wanting to deal with oral pocketing and lack of appetite.       Advised she consume her solid food first before consuming Ensure to encourage mastication, use of oral musculature. Pt states she will "try" and inquired if SLP was going to continue to work with her. SLP advised would continue as long as pt agreeable and making progress to which pt says "I'll try harder".    She has clearly made progress with her ability to masticate and swallow -  and is currently being treated for her  sadness with current situation.  Will follow up briefly to continue to encourage pt's po intake - and attempt meal observation for generalization of compensations.    HPI HPI: Patient is a 30 year old female with morbid obesity, nonalcoholic fatty liver disease, GERD, depression, recently was hospitalized following a mechanical fall with a left knee dislocation, resultant MCL strain, ACL and PCL rupture then complicated by nausea vomiting, tachycardia, UTI and fecal impaction during the hospitalization. Progressive weakness during this hospitalization. 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      SLP Plan  Continue with current plan of care      Recommendations for follow up therapy are one component of a multi-disciplinary discharge planning process, led by the attending physician.  Recommendations may be updated based on patient status, additional functional criteria and insurance authorization.    Recommendations  Diet recommendations: Dysphagia 3 (mechanical soft);Thin liquid Liquids provided via: Straw;Cup Medication Administration: Other (Comment) (as tolerated) Compensations: Follow solids with liquid;Lingual sweep for clearance of pocketing;Other (Comment) (oral suction within reach) Postural Changes and/or Swallow Maneuvers: Seated upright 90 degrees;Upright 30-60 min after meal                Oral Care Recommendations: Oral care BID SLP Visit Diagnosis: Dysphagia, oral phase (R13.11);Dysarthria and anarthria (R47.1) Plan: Continue with current plan of care       GO               Tammy K,  MS [facility] SLP Acute Rehab Services Office [phone] Pager [phone]  [name]  01/04/2021, 5:55 PM

## 2021-01-04 NOTE — Progress Notes (Signed)
Initial Nutrition Assessment  DOCUMENTATION CODES:  Morbid obesity  INTERVENTION:  Liberalize diet to regular to promote oral intake Will request food be chopped into bite sized pieces and dining services alert nursing staff when tray is delivered Nursing staff to assist with set-up and feeeding of meals Increase Ensure Enlive po to TID, each supplement provides 350 kcal and 20 grams of protein Continue MVI regimen  NUTRITION DIAGNOSIS:  Inadequate oral intake related to poor appetite (difficulty with self-feeding) as evidenced by meal completion < 25%, per patient/family report.  GOAL:  Patient will meet greater than or equal to 90% of their needs  MONITOR:  PO intake, Supplement acceptance, Weight trends  REASON FOR ASSESSMENT:  Consult Assessment of nutrition requirement/status  ASSESSMENT:  30 y.o. female with history of class III obesity, depression, GERD, nonalcoholic steatohepatitis, and PUD presented to ED 9/16 after being dc 9/5 after a month long admission with complaints of worsening nausea and vomiting at home.  Pt found to have tachycardia, hypertension, and LFTs elevated on admission. Developed tongue and throat swelling along with tingling in the hands and feet 9/23. Vitamin labs were assessed and b12 and folate found to be low and pt was started on a supplement.   GI consulted and felt nausea and vomiting may have been caused by opioid-induced gastroparesis/constipation.  Neurology performed LP 9/28 to assess for albuminocytological dissociation which returned with a critical WBC value and they determined presentation most consistent with Guillain-Barr syndrome.    Called pt on room phone to discuss recent intake and appetite. Reviewed chart and intake. Over the past few weeks, pt's poor appetite has been worsening. Pt requested MD add ensure, which she has been consuming. But she has very low intake of meals. Reviewed dining services records and pt is not routinely  ordering 3 meals a day.   Pt reports that she is going to try and eat more, states that she just has no appetite and that she is having a hard time feeding herself due to her hand weakness. Having a hard time gripping silverware, but OT has provided foam handles to assist with this. Also reports that she is swallowing much better now, just has to chew well and take small bites. Will recommend a regular diet and communicate these needs to dining services and request foods be chopped for easier intake.  Discussed pt's intake with RN. Endorses that intake is poor, but that pt is drinking ensure. Pt reports preferring vanilla flavors. Will increase ensure to TID until meal intake increases to ensure adequate protein and kcal are being consumed.   Weight has greatly fluctuated this admission from 399-503 lb. Pt does have edema present per RN documentation but unsure of what accurate weight is currently.   Average Meal Intake: 9/20-10/1: 28% intake x 8 recorded meals 10/2-10/12: 8% intake x 6 recorded meals  Nutritionally Relevant Medications: Scheduled Meds:  bisacodyl  10 mg Rectal Once   calcium carbonate  1 tablet Oral BID   cyanocobalamin  1,000 mcg Intramuscular Weekly   feeding supplement  237 mL Oral BID BM   folic acid  1 mg Oral Daily   mirtazapine  15 mg Oral QHS   multivitamin  15 mL Oral Daily   pantoprazole  40 mg Oral BID   polyethylene glycol  17 g Oral Daily   senna-docusate  1 tablet Oral BID   PRN Meds: diphenhydrAMINE, ondansetron  Labs Reviewed Calcium WNL, corrects to 9.74 for low albumin (10/11) Folate: 3.2 (  9/23) Vitamin D: 23.62 (8/10) Vitamin B12: 153  Lab Results  Component Value Date   ALT 106 (H) 01/03/2021   AST 343 (H) 01/03/2021   ALKPHOS 81 01/03/2021   BILITOT 1.7 (H) 01/03/2021   NUTRITION - FOCUSED PHYSICAL EXAM: Defer to in-person assessment  Diet Order:   Diet Order             DIET SOFT Room service appropriate? Yes; Fluid consistency:  Thin  Diet effective now                   EDUCATION NEEDS:  Education needs have been addressed  Skin:  Skin Assessment: Reviewed RN Assessment  Last BM:  10/11  Height:  Ht Readings from Last 1 Encounters:  12/13/20 5\' 4"  (1.626 m)   Weight:  Wt Readings from Last 1 Encounters:  01/04/21 (!) 198 kg    Ideal Body Weight:  54.5 kg  BMI:  Body mass index is 74.93 kg/m.  Estimated Nutritional Needs:  Kcal:  2000-2200 kcal/d Protein:  100-110 g/d Fluid:  1.8-2 L/d   03/06/21, RD, LDN Clinical Dietitian RD pager # available in [facility]  After hours/weekend pager # available in [facility]

## 2021-01-05 DIAGNOSIS — S83105D Unspecified dislocation of left knee, subsequent encounter: Secondary | ICD-10-CM

## 2021-01-05 LAB — URINALYSIS, ROUTINE W REFLEX MICROSCOPIC
Bilirubin Urine: NEGATIVE
Glucose, UA: NEGATIVE mg/dL
Hgb urine dipstick: NEGATIVE
Ketones, ur: NEGATIVE mg/dL
Leukocytes,Ua: NEGATIVE
Nitrite: NEGATIVE
Protein, ur: NEGATIVE mg/dL
Specific Gravity, Urine: 1.025 (ref 1.005–1.030)
pH: 6 (ref 5.0–8.0)

## 2021-01-05 LAB — HEPATITIS C ANTIBODY: HCV Ab: 0.1 s/co ratio — AB (ref 0.0–0.9)

## 2021-01-05 LAB — CULTURE, BLOOD (ROUTINE X 2): Special Requests: ADEQUATE

## 2021-01-05 MED ORDER — HYDROMORPHONE HCL 1 MG/ML IJ SOLN
0.5000 mg | Freq: Once | INTRAMUSCULAR | Status: AC
  Administered 2021-01-05: 0.5 mg via INTRAVENOUS
  Filled 2021-01-05: qty 0.5

## 2021-01-05 MED ORDER — PROCHLORPERAZINE EDISYLATE 10 MG/2ML IJ SOLN
5.0000 mg | Freq: Four times a day (QID) | INTRAMUSCULAR | Status: AC | PRN
  Administered 2021-01-05 – 2021-01-06 (×2): 5 mg via INTRAVENOUS
  Filled 2021-01-05 (×2): qty 2

## 2021-01-05 MED ORDER — ONDANSETRON HCL 4 MG/2ML IJ SOLN
4.0000 mg | Freq: Four times a day (QID) | INTRAMUSCULAR | Status: DC | PRN
  Administered 2021-01-05 – 2021-01-15 (×5): 4 mg via INTRAVENOUS
  Filled 2021-01-05 (×6): qty 2

## 2021-01-05 MED ORDER — SODIUM CHLORIDE 0.9% FLUSH
3.0000 mL | Freq: Two times a day (BID) | INTRAVENOUS | Status: DC
  Administered 2021-01-05 – 2021-01-26 (×41): 3 mL via INTRAVENOUS

## 2021-01-05 MED ORDER — ONDANSETRON 4 MG PO TBDP
4.0000 mg | ORAL_TABLET | Freq: Four times a day (QID) | ORAL | Status: DC | PRN
  Administered 2021-01-15: 4 mg via ORAL
  Filled 2021-01-05: qty 1

## 2021-01-05 NOTE — Progress Notes (Addendum)
Physical Therapy Treatment Patient Details Name: Carla Little MRN: [id number] DOB: Aug 01, 1990 Today's Date: 01/05/2021   History of Present Illness Patient is a 30 y.o. female who presented to [facility] for intractable N/V on 9/17. ED labwork reveals e. coli UTI. Pt had recent hospital admission from 8/8-9/5 due to Lt knee dislocation which was reduced with fractures of the proximal fibula ligamentous avulsion laterally as well as medially off the medial femoral condyle and MRI showed complete ACL & PCL tears and MCL strain. That hospital admission was complicated by nausea, vomiting, tachycardia, UTI, and fecal impaction;. PMH significant for morbid obesity, GERD, fatty liver disease, depression. Patient now with Guillian-Barre syndrome    PT Comments    Patient appears with more down affect.  Assisted with bed mobility, having patient assist as able with UE's to reach for rails.   Assisted with A/AA exercises for both LE's. Patrient continues with profound weakness entire body and edema.  HR in 112-114 range on 2 L [place]    Recommendations for follow up therapy are one component of a multi-disciplinary discharge planning process, led by the attending physician.  Recommendations may be updated based on patient status, additional functional criteria and insurance authorization.  Follow Up Recommendations  SNF     Equipment Recommendations  Wheelchair cushion (measurements PT);Wheelchair (measurements PT)    Recommendations for Other Services       Precautions / Restrictions Precautions Precaution Comments: Back pain.  go slow with sitting in  somewhat chair position, can slide  down, be sure legs and feet are secure to foot board with folded blankets Other Brace: does not need KI at this time but may consider since opatient has more weakness due GBS     Mobility  Bed Mobility   Bed Mobility: Rolling Rolling: Max assist;Total assist;+2 for physical assistance;+2 for  safety/equipment         General bed mobility comments: assist with arms to reach for rails when rolling.    Transfers                    Ambulation/Gait                 Stairs             Wheelchair Mobility    Modified Rankin (Stroke Patients Only)       Balance                                            Cognition Arousal/Alertness: Awake/alert Behavior During Therapy: Flat affect                                   General Comments: patient very flat, not as verbal today.      Exercises General Exercises - Lower Extremity Ankle Circles/Pumps: AROM;Both;10 reps Quad Sets: AROM;Supine;Both;10 reps Short Arc QuadBarbaraann Boys;Both;10 reps;Supine Heel Slides: Right;10 reps;AAROM    General Comments        Pertinent Vitals/Pain Faces Pain Scale: Hurts whole lot Pain Location: back, Left knee Pain Descriptors / Indicators: Grimacing;Discomfort;Moaning Pain Intervention(s): Monitored during session;RN gave pain meds during session    Home Living                      Prior Function  PT Goals (current goals can now be found in the care plan section) Acute Rehab PT Goals Patient Stated Goal: to walk PT Goal Formulation: With patient Time For Goal Achievement: 01/19/21 Potential to Achieve Goals: Fair Additional Goals Additional Goal #1: (P) tolerated tilting to stand x 10 minutes at 40 degrees. Progress towards PT goals: Progressing toward goals    Frequency    Min 2X/week      PT Plan Current plan remains appropriate    Co-evaluation              AM-PAC PT "6 Clicks" Mobility   Outcome Measure    Help needed moving from lying on your back to sitting on the side of a flat bed without using bedrails?: A Lot Help needed moving to and from a bed to a chair (including a wheelchair)?: Total Help needed standing up from a chair using your arms (e.g., wheelchair or bedside  chair)?: Total Help needed to walk in hospital room?: Total Help needed climbing 3-5 steps with a railing? : Total 6 Click Score: 6    End of Session   Activity Tolerance: Patient limited by fatigue Patient left: in bed;with call bell/phone within reach Nurse Communication: Mobility status;Need for lift equipment PT Visit Diagnosis: Muscle weakness (generalized) (M62.81) Pain - Right/Left: Left Pain - part of body: Knee     Time: 2334-3568 PT Time Calculation (min) (ACUTE ONLY): 55 min  Charges:  $Therapeutic Activity: 23-37 mins $Neuromuscular Re-education: 23-37 mins                      Blanchard Kelch PT Acute Rehabilitation Services Pager [phone] Office [phone]    Rada Hay 01/05/2021, 12:51 PM

## 2021-01-05 NOTE — Progress Notes (Signed)
RT NOTE:  NIF -40  With good patient effort.

## 2021-01-05 NOTE — Progress Notes (Signed)
PROGRESS NOTE    Carla Little  DJT:[id number] DOB: [birth year] DOA: 12/09/2020 PCP: Patient, No Pcp Per (Inactive)   Brief Narrative: Carla Little is a 30 y.o. female with a history of morbid obesity, recent left knee dislocation. Patient presented secondary to weakness with presentation concerning for Guillain Barre syndrome. Neurology consulted and patient started on IVIG. She was also found to have vitamin B12 and folate deficiencies. Patient is not awaiting rehab options for discharge.   Assessment & Plan:   Principal Problem:   Intractable vomiting with nausea Active Problems:   Left knee dislocation   PUD (peptic ulcer disease)   Nonalcoholic steatohepatitis (NASH)   Class 3 obesity (HCC)   Sinus tachycardia   Hypokalemia   GERD (gastroesophageal reflux disease)   Iron deficiency anemia due to chronic blood loss   E. coli UTI   Guillain Barr syndrome (HCC)   Guillain Barre syndrome Neuropathy Neurology consulted and recommended IVIG, for which patient completed 5 day course. Also recommended treating confounding diagnoses of vitamin B12 and folate deficiency.  -Continue gabapentin 300 mg TID for paraesthesias; titrate up as able -Neurology (signed off 10/2): Vitamin B12 qweek for 4 weeks, then monthly Vitamin B12 injections.  Intractable nausea and vomiting Resolved.  Depression/anxiety Evaluated by psychiatry -Continue Remeron and Buspar  Shortness of breath Transient. Resolved.  Left knee dislocation Patient previously evaluated by orthopedic surgery with recommendation for hinge brace and to allow for joint scarring. Currently weight bearing as tolerated with limited flexion of left knee.  Sinus tachycardia This has been managed with metoprolol. Unsure of etiology. Possibly related to deconditioning. No concerning symptoms.  Iron deficiency anemia Related to heavy menstruation. Hemoglobin stable.  Elevated AST/ALT NAFLD AST/ALT continue to  trend upwards. No associated symptoms. Abdominal ultrasound (10/11) significant for fatty liver disease and cholelithiasis without cholecystitis. INR normal. Bilirubin trended down slightly last night.  -CMP in AM  B12 deficiency Folate deficiency B12 and folate of 153 and 3.2 respectively. Patient started on Vitamin B12 and folic acid supplementation  E. Coli UTI Completed treatment with Ceftriaxone and transitioned to Cefazolin.  Hypocalcemia Mild and likely related to hypoalbuminemia.  LE edema Possibly related to hypoalbuminemia. No evidence of kidney or heart impairment. Liver does have some disease; normal bilirubin. No ascites on ultrasound.  Possible oral candidiasis Treated with Diflucan IV. Seems to be resolved.  Positive blood culture result 1/4 samples significant for staphylococcus epidermidis. Unlikely active infection.  Morbid obesity Body mass index is 75.76 kg/m. Dietitian consulted. -Dietitian recommendations (10/12): Liberalize diet to regular to promote oral intake Will request food be chopped into bite sized pieces and dining services alert nursing staff when tray is delivered Nursing staff to assist with set-up and feeeding of meals Increase Ensure Enlive po to TID, each supplement provides 350 kcal and 20 grams of protein Continue MVI regimen  DVT prophylaxis: Lovenox Code Status:   Code Status: Full Code Family Communication: None at bedside Disposition Plan: Discharge to SNF when bed is available if remains stable.   Consultants:  Neurology Psychiatry Gastroenterology  Procedures:  None  Antimicrobials: Ceftriaxone Ancef Diflucan Flagyl    Subjective: Back pain. Paresthesias of bilateral legs. No appetite  Objective: Vitals:   01/04/21 2210 01/05/21 0405 01/05/21 0406 01/05/21 1214  BP: 136/84 120/76  127/79  Pulse: (!) 109 (!) 109  (!) 106  Resp: 18 18    Temp: 97.9 F (36.6 C) 98.9 F (37.2 C)  97.7 F (36.5 C)  TempSrc:  Axillary Oral  Oral  SpO2: 100% 100%  95%  Weight:   (!) 200.2 kg   Height:        Intake/Output Summary (Last 24 hours) at 01/05/2021 1230 Last data filed at 01/04/2021 2135 Gross per 24 hour  Intake --  Output 600 ml  Net -600 ml    Filed Weights   01/02/21 2103 01/04/21 0500 01/05/21 0406  Weight: (!) 194.1 kg (!) 198 kg (!) 200.2 kg    Examination:  General exam: Appears calm and comfortable  Respiratory system: Clear to auscultation. Respiratory effort normal. Cardiovascular system: S1 & S2 heard, RRR. No murmurs, rubs, gallops or clicks. Gastrointestinal system: Abdomen is obese, soft and nontender. No organomegaly or masses felt. Normal bowel sounds heard. Central nervous system: Alert and oriented. Decreased BLE sensation Musculoskeletal: Bilateral foot edema. Difficult to assess for whole body edema. No calf tenderness Skin: No cyanosis. No rashes Psychiatry: Judgement and insight appear normal. Flat affect    Data Reviewed: I have personally reviewed following labs and imaging studies  CBC Lab Results  Component Value Date   WBC 8.4 01/03/2021   RBC 2.82 (L) 01/03/2021   HGB 8.7 (L) 01/03/2021   HCT 29.7 (L) 01/03/2021   MCV 105.3 (H) 01/03/2021   MCH 30.9 01/03/2021   PLT 562 (H) 01/03/2021   MCHC 29.3 (L) 01/03/2021   RDW 22.2 (H) 01/03/2021   LYMPHSABS 2.0 01/03/2021   MONOABS 0.9 01/03/2021   EOSABS 0.0 01/03/2021   BASOSABS 0.0 01/03/2021     Last metabolic panel Lab Results  Component Value Date   NA 135 01/03/2021   K 3.7 01/03/2021   CL 98 01/03/2021   CO2 30 01/03/2021   BUN 8 01/03/2021   CREATININE 0.33 (L) 01/03/2021   GLUCOSE 104 (H) 01/03/2021   GFRNONAA >60 01/03/2021   GFRAA >60 05/16/2018   CALCIUM 8.7 (L) 01/03/2021   PHOS 2.9 12/14/2020   PROT 8.4 (H) 01/04/2021   ALBUMIN 2.7 (L) 01/04/2021   BILITOT 1.5 (H) 01/04/2021   ALKPHOS 89 01/04/2021   AST 406 (H) 01/04/2021   ALT 121 (H) 01/04/2021   ANIONGAP 7 01/03/2021     CBG (last 3)  No results for input(s): GLUCAP in the last 72 hours.   GFR: Estimated Creatinine Clearance: 181.6 mL/min (A) (by C-G formula based on SCr of 0.33 mg/dL (L)).  Coagulation Profile: Recent Labs  Lab 01/04/21 1428  INR 1.0    Recent Results (from the past 240 hour(s))  Culture, blood (routine x 2)     Status: Abnormal   Collection Time: 01/02/21  9:08 AM   Specimen: BLOOD LEFT HAND  Result Value Ref Range Status   Specimen Description   Final    BLOOD LEFT HAND Performed at [facility], [address], [place], [place] [address]    Special Requests   Final    BOTTLES DRAWN AEROBIC ONLY Blood Culture adequate volume Performed at [facility], [address], [place], [place] [address]    Culture  Setup Time   Final    GRAM POSITIVE COCCI IN CLUSTERS AEROBIC BOTTLE ONLY CRITICAL RESULT CALLED TO, READ BACK BY AND VERIFIED WITH: PHARMD DREW W. [phone] FCP    Culture (A)  Final    STAPHYLOCOCCUS EPIDERMIDIS THE SIGNIFICANCE OF ISOLATING THIS ORGANISM FROM A SINGLE SET OF BLOOD CULTURES WHEN MULTIPLE SETS ARE DRAWN IS UNCERTAIN. PLEASE NOTIFY THE MICROBIOLOGY DEPARTMENT WITHIN ONE WEEK IF  SPECIATION AND SENSITIVITIES ARE REQUIRED. Performed at [facility], [address], [place], [place] [address]    Report Status 01/05/2021 FINAL  Final  Culture, blood (routine x 2)     Status: None (Preliminary result)   Collection Time: 01/02/21  9:08 AM   Specimen: BLOOD RIGHT HAND  Result Value Ref Range Status   Specimen Description   Final    BLOOD RIGHT HAND Performed at [facility], [address] [place], [place] [address]    Special Requests   Final    BOTTLES DRAWN AEROBIC AND ANAEROBIC Blood Culture adequate volume Performed at [facility], [address], [place], [place] [address]    Culture   Final    NO GROWTH 3 DAYS Performed at [facility], [address], [place], [place] [address]    Report Status PENDING  Incomplete  Blood Culture ID Panel (Reflexed)     Status: Abnormal   Collection Time: 01/02/21  9:08 AM  Result Value Ref Range Status   Enterococcus faecalis NOT DETECTED NOT DETECTED Final   Enterococcus Faecium NOT DETECTED NOT DETECTED Final   Listeria monocytogenes NOT DETECTED NOT DETECTED Final   Staphylococcus species DETECTED (A) NOT DETECTED Final    Comment: CRITICAL RESULT CALLED TO, READ BACK BY AND VERIFIED WITH: PHARMD DREW W. [phone] [id number] FCP    Staphylococcus aureus (BCID) NOT DETECTED NOT DETECTED Final   Staphylococcus epidermidis DETECTED (A) NOT DETECTED Final    Comment: Methicillin (oxacillin) resistant coagulase negative staphylococcus. Possible blood culture contaminant (unless isolated from more than one blood culture draw or clinical case suggests pathogenicity). No antibiotic treatment is indicated for blood  culture contaminants. CRITICAL RESULT CALLED TO, READ BACK BY AND VERIFIED WITH: PHARMD DREW W. [phone] FCP    Staphylococcus lugdunensis NOT DETECTED NOT DETECTED Final   Streptococcus species NOT DETECTED NOT DETECTED Final   Streptococcus agalactiae NOT DETECTED NOT DETECTED Final   Streptococcus pneumoniae NOT DETECTED NOT DETECTED Final   Streptococcus pyogenes NOT DETECTED NOT DETECTED Final   A.calcoaceticus-baumannii NOT DETECTED NOT DETECTED Final   Bacteroides fragilis NOT DETECTED NOT DETECTED Final   Enterobacterales NOT DETECTED NOT DETECTED Final   Enterobacter cloacae complex NOT DETECTED NOT DETECTED Final   Escherichia coli NOT DETECTED NOT DETECTED Final   Klebsiella aerogenes NOT DETECTED NOT DETECTED Final   Klebsiella oxytoca NOT DETECTED NOT DETECTED Final   Klebsiella pneumoniae NOT DETECTED NOT DETECTED Final   Proteus species NOT DETECTED NOT DETECTED Final   Salmonella species NOT DETECTED NOT DETECTED Final   Serratia marcescens NOT DETECTED NOT DETECTED Final    Haemophilus influenzae NOT DETECTED NOT DETECTED Final   Neisseria meningitidis NOT DETECTED NOT DETECTED Final   Pseudomonas aeruginosa NOT DETECTED NOT DETECTED Final   Stenotrophomonas maltophilia NOT DETECTED NOT DETECTED Final   Candida albicans NOT DETECTED NOT DETECTED Final   Candida auris NOT DETECTED NOT DETECTED Final   Candida glabrata NOT DETECTED NOT DETECTED Final   Candida krusei NOT DETECTED NOT DETECTED Final   Candida parapsilosis NOT DETECTED NOT DETECTED Final   Candida tropicalis NOT DETECTED NOT DETECTED Final   Cryptococcus neoformans/gattii NOT DETECTED NOT DETECTED Final   Methicillin resistance mecA/C DETECTED (A) NOT DETECTED Final    Comment: CRITICAL RESULT CALLED TO, READ BACK BY AND VERIFIED WITH: PHARMD DREW W. [phone] [id number] FCP Performed at [facility], [address] [place], [place] [address]  Radiology Studies: US Abdomen Limited RUQ (LIVER/GB)  Result Date: 01/03/2021 CLINICAL DATA:  Elevated liver function tests EXAM: ULTRASOUND ABDOMEN LIMITED RIGHT UPPER QUADRANT COMPARISON:  12/13/2020 FINDINGS: Gallbladder: Layering sludge is seen within the gallbladder, similar to prior examination. The gallbladder, however, is not distended, there is no gallbladder wall thickening, and no pericholecystic fluid is identified. The sonographic Eulah Pont sign is reportedly negative. Common bile duct: Diameter: 3 mm in proximal diameter Liver: Hepatic parenchymal echogenicity is markedly increased diffusely and there is poor acoustic through transmission in keeping with changes of moderate to severe hepatic steatosis. This appears similar to prior examination. No focal intrahepatic masses are seen and there is no intrahepatic biliary ductal dilation. Portal vein is patent on color Doppler imaging with normal direction of blood flow towards the liver. Other: None. IMPRESSION: Stable examination. Cholelithiasis without sonographic evidence of acute  cholecystitis. Moderate to severe hepatic steatosis. Electronically Signed   By: Helyn Numbers M.D.   On: 01/03/2021 22:55        Scheduled Meds:  bisacodyl  10 mg Rectal Once   busPIRone  5 mg Oral BID   cyanocobalamin  1,000 mcg Intramuscular Weekly   enoxaparin (LOVENOX) injection  50 mg Subcutaneous Q12H   feeding supplement  237 mL Oral TID BM   folic acid  1 mg Oral Daily   gabapentin  300 mg Oral Q8H   lidocaine  1 patch Transdermal Daily   magic mouthwash w/lidocaine  5 mL Oral TID AC   metoprolol tartrate  25 mg Oral BID   mirtazapine  15 mg Oral QHS   multivitamin  15 mL Oral Daily   pantoprazole  40 mg Oral BID   polyethylene glycol  17 g Oral Daily   senna-docusate  1 tablet Oral BID   sodium chloride flush  3 mL Intravenous Q12H   Continuous Infusions:  sodium chloride       LOS: 26 days     Jacquelin Hawking, MD [facility] 01/05/2021, 12:30 PM  If 7PM-7AM, please contact night-coverage [email or web address]

## 2021-01-05 NOTE — TOC Progression Note (Signed)
Transition of Care [facility]) - Progression Note    Patient Details  Name: Carla Little MRN: [id number] Date of Birth: 18-Sep-1990  Transition of Care [facility]) CM/SW Contact  Ida Rogue, [place] Phone Number: 01/05/2021, 8:00 AM  Clinical Narrative:   Financial navigator confirms that disability application went in to [facility] about three weeks ago-third full week in September. TOC will continue to follow during the course of hospitalization.     Expected Discharge Plan: Skilled Nursing Facility Barriers to Discharge: SNF Pending bed offer  Expected Discharge Plan and Services Expected Discharge Plan: Skilled Nursing Facility                                               Social Determinants of Health (SDOH) Interventions    Readmission Risk Interventions No flowsheet data found.

## 2021-01-05 NOTE — TOC Progression Note (Signed)
Transition of Care [facility]) - Progression Note    Patient Details  Name: Carla Little MRN: [id number] Date of Birth: 04-29-1990  Transition [facility]) CM/SW Contact  Armanda Heritage, RN Phone Number: 01/05/2021, 2:51 PM  Clinical Narrative:    CM resent referral to [facility] and spoke with admissions rep.  Facility to review referral, CM notified rep that LOG can be provided.    Expected Discharge Plan: Skilled Nursing Facility Barriers to Discharge: SNF Pending bed offer  Expected Discharge Plan and Services Expected Discharge Plan: Skilled Nursing Facility                                               Social Determinants of Health (SDOH) Interventions    Readmission Risk Interventions No flowsheet data found.

## 2021-01-06 ENCOUNTER — Inpatient Hospital Stay (HOSPITAL_COMMUNITY): Payer: Medicaid Other

## 2021-01-06 LAB — RESP PANEL BY RT-PCR (FLU A&B, COVID) ARPGX2
Influenza A by PCR: NEGATIVE
Influenza B by PCR: NEGATIVE
SARS Coronavirus 2 by RT PCR: NEGATIVE

## 2021-01-06 LAB — COMPREHENSIVE METABOLIC PANEL
ALT: 115 U/L — ABNORMAL HIGH (ref 0–44)
AST: 352 U/L — ABNORMAL HIGH (ref 15–41)
Albumin: 2.6 g/dL — ABNORMAL LOW (ref 3.5–5.0)
Alkaline Phosphatase: 76 U/L (ref 38–126)
Anion gap: 9 (ref 5–15)
BUN: 6 mg/dL (ref 6–20)
CO2: 28 mmol/L (ref 22–32)
Calcium: 8.6 mg/dL — ABNORMAL LOW (ref 8.9–10.3)
Chloride: 97 mmol/L — ABNORMAL LOW (ref 98–111)
Creatinine, Ser: <0.3 mg/dL — ABNORMAL LOW (ref 0.44–1.00)
Glucose, Bld: 97 mg/dL (ref 70–99)
Potassium: 3.6 mmol/L (ref 3.5–5.1)
Sodium: 134 mmol/L — ABNORMAL LOW (ref 135–145)
Total Bilirubin: 1.6 mg/dL — ABNORMAL HIGH (ref 0.3–1.2)
Total Protein: 7.9 g/dL (ref 6.5–8.1)

## 2021-01-06 LAB — HEPATITIS PANEL, ACUTE
Hep A IgM: NONREACTIVE
Hep B C IgM: NONREACTIVE
Hepatitis B Surface Ag: NONREACTIVE

## 2021-01-06 LAB — PREGNANCY, URINE: Preg Test, Ur: NEGATIVE

## 2021-01-06 MED ORDER — PREGABALIN 50 MG PO CAPS
50.0000 mg | ORAL_CAPSULE | Freq: Three times a day (TID) | ORAL | Status: DC
  Administered 2021-01-06 – 2021-01-09 (×11): 50 mg via ORAL
  Filled 2021-01-06 (×11): qty 1

## 2021-01-06 MED ORDER — SODIUM CHLORIDE 0.9 % IV SOLN
6.2500 mg | Freq: Once | INTRAVENOUS | Status: AC
  Administered 2021-01-06: 6.25 mg via INTRAVENOUS
  Filled 2021-01-06 (×5): qty 0.25

## 2021-01-06 MED ORDER — SODIUM CHLORIDE 0.45 % IV SOLN: INTRAVENOUS | Status: DC

## 2021-01-06 MED ORDER — MORPHINE SULFATE (PF) 2 MG/ML IV SOLN
2.0000 mg | INTRAVENOUS | Status: DC | PRN
  Administered 2021-01-06: 4 mg via INTRAVENOUS
  Administered 2021-01-06: 2 mg via INTRAVENOUS
  Administered 2021-01-06 – 2021-01-07 (×3): 4 mg via INTRAVENOUS
  Filled 2021-01-06: qty 1
  Filled 2021-01-06: qty 2
  Filled 2021-01-06: qty 1
  Filled 2021-01-06 (×2): qty 2
  Filled 2021-01-06: qty 1

## 2021-01-06 MED ORDER — PROCHLORPERAZINE EDISYLATE 10 MG/2ML IJ SOLN
5.0000 mg | Freq: Four times a day (QID) | INTRAMUSCULAR | Status: AC | PRN
Start: 2021-01-06 — End: 2021-01-06
  Administered 2021-01-06 (×2): 5 mg via INTRAVENOUS
  Filled 2021-01-06 (×2): qty 2

## 2021-01-06 MED ORDER — IOHEXOL 350 MG/ML SOLN
100.0000 mL | Freq: Once | INTRAVENOUS | Status: AC | PRN
  Administered 2021-01-06: 100 mL via INTRAVENOUS

## 2021-01-06 NOTE — Progress Notes (Signed)
PROGRESS NOTE    Carla Little  LPF:[id number] DOB: Aug 28, 1990 DOA: 12/09/2020 PCP: Patient, No Pcp Per (Inactive)   Brief Narrative: Carla Little is a 30 y.o. female with a history of morbid obesity, recent left knee dislocation. Patient presented secondary to weakness with presentation concerning for Guillain Barre syndrome. Neurology consulted and patient started on IVIG. She was also found to have vitamin B12 and folate deficiencies. Patient is not awaiting rehab options for discharge.   Assessment & Plan:   Principal Problem:   Intractable vomiting with nausea Active Problems:   Left knee dislocation   PUD (peptic ulcer disease)   Nonalcoholic steatohepatitis (NASH)   Class 3 obesity (HCC)   Sinus tachycardia   Hypokalemia   GERD (gastroesophageal reflux disease)   Iron deficiency anemia due to chronic blood loss   E. coli UTI   Guillain Barr syndrome (HCC)   Guillain Barre syndrome Neuropathy Neurology consulted and recommended IVIG, for which patient completed 5 day course. Also recommended treating confounding diagnoses of vitamin B12 and folate deficiency.  -Discontinue gabapentin and start Lyrica 50 mg TID -Neurology (signed off 10/2): Vitamin B12 qweek for 4 weeks, then monthly Vitamin B12 injections.  Intractable nausea and vomiting Initially resolved, now returned. Associated generalized abdominal pain. Last bowel movement was last night -Abdominal x-ray; if non-diagnostic, will likely obtain CT abdomen/pelvis -If imaging negative, may need GI consult -1/2 NS @ 75 ml/hr while vomiting  Depression/anxiety Evaluated by psychiatry -Continue Remeron and Buspar  Shortness of breath Transient. Resolved.  Left knee dislocation Patient previously evaluated by orthopedic surgery with recommendation for hinge brace and to allow for joint scarring. Currently weight bearing as tolerated with limited flexion of left knee.  Sinus tachycardia This has  been managed with metoprolol. Unsure of etiology. Possibly related to deconditioning. No concerning symptoms.  Iron deficiency anemia Related to heavy menstruation. Hemoglobin stable.  Elevated AST/ALT NAFLD AST/ALT continue to trend upwards. No associated symptoms. Abdominal ultrasound (10/11) significant for fatty liver disease and cholelithiasis without cholecystitis. INR normal. Bilirubin stable and AST/ALT now trending down. Some abdominal pain starting last night  -CMP daily   B12 deficiency Folate deficiency B12 and folate of 153 and 3.2 respectively. Patient started on Vitamin B12 and folic acid supplementation  E. Coli UTI Completed treatment with Ceftriaxone and transitioned to Cefazolin.  Hypoxia overnight Likely patient has OSA and/or OHS judging by symptoms and body habitus. Currently managed with oxygen overnight while asleep  Hypocalcemia Mild and likely related to hypoalbuminemia.  LE edema Possibly related to hypoalbuminemia. No evidence of kidney or heart impairment. Liver does have some disease; normal bilirubin. No ascites on ultrasound.  Possible oral candidiasis Treated with Diflucan IV. Seems to be resolved.  Positive blood culture result 1/4 samples significant for staphylococcus epidermidis. Unlikely active infection.  Morbid obesity Body mass index is 74.47 kg/m. Dietitian consulted. -Dietitian recommendations (10/12): Liberalize diet to regular to promote oral intake Will request food be chopped into bite sized pieces and dining services alert nursing staff when tray is delivered Nursing staff to assist with set-up and feeeding of meals Increase Ensure Enlive po to TID, each supplement provides 350 kcal and 20 grams of protein Continue MVI regimen  DVT prophylaxis: Lovenox Code Status:   Code Status: Full Code Family Communication: None at bedside Disposition Plan: Discharge to SNF when bed is available if remains stable.   Consultants:   Neurology Psychiatry Gastroenterology  Procedures:  None  Antimicrobials: Ceftriaxone Ancef Diflucan  Flagyl    Subjective: Abdominal pain with associated nausea and vomiting last night. Bowel movement yesterday as well. Continued abdominal pain and nausea this morning. Antiemetics not helping much.  Objective: Vitals:   01/05/21 0406 01/05/21 1214 01/05/21 2035 01/06/21 0427  BP:  127/79 128/86 (!) 137/93  Pulse:  (!) 106 (!) 103 (!) 108  Resp:   20 16  Temp:  97.7 F (36.5 C) 98.4 F (36.9 C) 98.6 F (37 C)  TempSrc:  Oral Oral Oral  SpO2:  95% 97% 98%  Weight: (!) 200.2 kg   (!) 196.8 kg  Height:        Intake/Output Summary (Last 24 hours) at 01/06/2021 0908 Last data filed at 01/06/2021 6269 Gross per 24 hour  Intake 0 ml  Output 550 ml  Net -550 ml    Filed Weights   01/04/21 0500 01/05/21 0406 01/06/21 0427  Weight: (!) 198 kg (!) 200.2 kg (!) 196.8 kg    Examination:  General exam: Appears calm and comfortable Respiratory system: Clear to auscultation. Respiratory effort normal. Cardiovascular system: S1 & S2 heard, RRR. No murmurs, rubs, gallops or clicks. Gastrointestinal system: Abdomen is nondistended, soft and mildly diffusely tender. No organomegaly or masses felt. Normal bowel sounds heard. Central nervous system: Alert and oriented. No focal neurological deficits. Musculoskeletal: Foot edema. No calf tenderness Skin: No cyanosis. No rashes Psychiatry: Judgement and insight appear normal. Depressed seeming mood with flat affect.     Data Reviewed: I have personally reviewed following labs and imaging studies  CBC Lab Results  Component Value Date   WBC 8.4 01/03/2021   RBC 2.82 (L) 01/03/2021   HGB 8.7 (L) 01/03/2021   HCT 29.7 (L) 01/03/2021   MCV 105.3 (H) 01/03/2021   MCH 30.9 01/03/2021   PLT 562 (H) 01/03/2021   MCHC 29.3 (L) 01/03/2021   RDW 22.2 (H) 01/03/2021   LYMPHSABS 2.0 01/03/2021   MONOABS 0.9 01/03/2021   EOSABS  0.0 01/03/2021   BASOSABS 0.0 01/03/2021     Last metabolic panel Lab Results  Component Value Date   NA 134 (L) 01/06/2021   K 3.6 01/06/2021   CL 97 (L) 01/06/2021   CO2 28 01/06/2021   BUN 6 01/06/2021   CREATININE <0.30 (L) 01/06/2021   GLUCOSE 97 01/06/2021   GFRNONAA NOT CALCULATED 01/06/2021   GFRAA >60 05/16/2018   CALCIUM 8.6 (L) 01/06/2021   PHOS 2.9 12/14/2020   PROT 7.9 01/06/2021   ALBUMIN 2.6 (L) 01/06/2021   BILITOT 1.6 (H) 01/06/2021   ALKPHOS 76 01/06/2021   AST 352 (H) 01/06/2021   ALT 115 (H) 01/06/2021   ANIONGAP 9 01/06/2021    CBG (last 3)  No results for input(s): GLUCAP in the last 72 hours.   GFR: CrCl cannot be calculated (This lab value cannot be used to calculate CrCl because it is not a number: <0.30).  Coagulation Profile: Recent Labs  Lab 01/04/21 1428  INR 1.0     Recent Results (from the past 240 hour(s))  Culture, blood (routine x 2)     Status: Abnormal   Collection Time: 01/02/21  9:08 AM   Specimen: BLOOD LEFT HAND  Result Value Ref Range Status   Specimen Description   Final    BLOOD LEFT HAND Performed at [facility], [address], [place], [place] [address]    Special Requests   Final    BOTTLES DRAWN AEROBIC ONLY Blood Culture adequate volume Performed at [facility], [address], [place], [place] [address]    Culture  Setup Time   Final    GRAM POSITIVE COCCI IN CLUSTERS AEROBIC BOTTLE ONLY CRITICAL RESULT CALLED TO, READ BACK BY AND VERIFIED WITH: PHARMD DREW W. [phone] FCP    Culture (A)  Final    STAPHYLOCOCCUS EPIDERMIDIS THE SIGNIFICANCE OF ISOLATING THIS ORGANISM FROM A SINGLE SET OF BLOOD CULTURES WHEN MULTIPLE SETS ARE DRAWN IS UNCERTAIN. PLEASE NOTIFY THE MICROBIOLOGY DEPARTMENT WITHIN ONE WEEK IF SPECIATION AND SENSITIVITIES ARE REQUIRED. Performed at [facility], [address] [place], [place] [address]    Report Status 01/05/2021 FINAL  Final   Culture, blood (routine x 2)     Status: None (Preliminary result)   Collection Time: 01/02/21  9:08 AM   Specimen: BLOOD RIGHT HAND  Result Value Ref Range Status   Specimen Description   Final    BLOOD RIGHT HAND Performed at [facility], [address], [place], [place] [address]    Special Requests   Final    BOTTLES DRAWN AEROBIC AND ANAEROBIC Blood Culture adequate volume Performed at [facility], [address], [place], [place] [address]    Culture   Final    NO GROWTH 4 DAYS Performed at [facility], [address], [place], [place] [address]    Report Status PENDING  Incomplete  Blood Culture ID Panel (Reflexed)     Status: Abnormal   Collection Time: 01/02/21  9:08 AM  Result Value Ref Range Status   Enterococcus faecalis NOT DETECTED NOT DETECTED Final   Enterococcus Faecium NOT DETECTED NOT DETECTED Final   Listeria monocytogenes NOT DETECTED NOT DETECTED Final   Staphylococcus species DETECTED (A) NOT DETECTED Final    Comment: CRITICAL RESULT CALLED TO, READ BACK BY AND VERIFIED WITH: PHARMD DREW W. [id number] FCP    Staphylococcus aureus (BCID) NOT DETECTED NOT DETECTED Final   Staphylococcus epidermidis DETECTED (A) NOT DETECTED Final    Comment: Methicillin (oxacillin) resistant coagulase negative staphylococcus. Possible blood culture contaminant (unless isolated from more than one blood culture draw or clinical case suggests pathogenicity). No antibiotic treatment is indicated for blood  culture contaminants. CRITICAL RESULT CALLED TO, READ BACK BY AND VERIFIED WITH: PHARMD DREW W. [id number] FCP    Staphylococcus lugdunensis NOT DETECTED NOT DETECTED Final   Streptococcus species NOT DETECTED NOT DETECTED Final   Streptococcus agalactiae NOT DETECTED NOT DETECTED Final   Streptococcus pneumoniae NOT DETECTED NOT DETECTED Final   Streptococcus pyogenes NOT DETECTED NOT DETECTED Final    A.calcoaceticus-baumannii NOT DETECTED NOT DETECTED Final   Bacteroides fragilis NOT DETECTED NOT DETECTED Final   Enterobacterales NOT DETECTED NOT DETECTED Final   Enterobacter cloacae complex NOT DETECTED NOT DETECTED Final   Escherichia coli NOT DETECTED NOT DETECTED Final   Klebsiella aerogenes NOT DETECTED NOT DETECTED Final   Klebsiella oxytoca NOT DETECTED NOT DETECTED Final   Klebsiella pneumoniae NOT DETECTED NOT DETECTED Final   Proteus species NOT DETECTED NOT DETECTED Final   Salmonella species NOT DETECTED NOT DETECTED Final   Serratia marcescens NOT DETECTED NOT DETECTED Final   Haemophilus influenzae NOT DETECTED NOT DETECTED Final   Neisseria meningitidis NOT DETECTED NOT DETECTED Final   Pseudomonas aeruginosa NOT DETECTED NOT DETECTED Final   Stenotrophomonas maltophilia NOT DETECTED NOT DETECTED Final   Candida albicans NOT DETECTED NOT DETECTED Final   Candida auris NOT DETECTED NOT DETECTED Final  Candida glabrata NOT DETECTED NOT DETECTED Final   Candida krusei NOT DETECTED NOT DETECTED Final   Candida parapsilosis NOT DETECTED NOT DETECTED Final   Candida tropicalis NOT DETECTED NOT DETECTED Final   Cryptococcus neoformans/gattii NOT DETECTED NOT DETECTED Final   Methicillin resistance mecA/C DETECTED (A) NOT DETECTED Final    Comment: CRITICAL RESULT CALLED TO, READ BACK BY AND VERIFIED WITH: PHARMD DREW W. [phone] FCP Performed at [facility], [address] [place], [place] [address]         Radiology Studies: No results found.      Scheduled Meds:  bisacodyl  10 mg Rectal Once   busPIRone  5 mg Oral BID   cyanocobalamin  1,000 mcg Intramuscular Weekly   enoxaparin (LOVENOX) injection  50 mg Subcutaneous Q12H   feeding supplement  237 mL Oral TID BM   folic acid  1 mg Oral Daily   gabapentin  300 mg Oral Q8H   lidocaine  1 patch Transdermal Daily   magic mouthwash w/lidocaine  5 mL Oral TID AC   metoprolol tartrate  25 mg Oral  BID   mirtazapine  15 mg Oral QHS   multivitamin  15 mL Oral Daily   pantoprazole  40 mg Oral BID   polyethylene glycol  17 g Oral Daily   senna-docusate  1 tablet Oral BID   sodium chloride flush  3 mL Intravenous Q12H   Continuous Infusions:  sodium chloride       LOS: 27 days     Jacquelin Hawking, MD [facility] 01/06/2021, 9:08 AM  If 7PM-7AM, please contact night-coverage [email or web address]

## 2021-01-06 NOTE — Progress Notes (Signed)
RT NOTE:  NIF -40 VC 1.7L  Good patient effort considering patient not feeling well from being sick over night.

## 2021-01-06 NOTE — Progress Notes (Signed)
Chaplain attempted follow up visit with patient but she was asleep.   Please contact chaplain if she's awake and desiring visit today. Rev. Lynnell Chad Pager [phone]

## 2021-01-06 NOTE — Progress Notes (Signed)
SLP Cancellation Note  Patient Details Name: Carla Little MRN: [id number] DOB: 1990-05-09   Cancelled treatment:       Reason Eval/Treat Not Completed: Other (comment) (Pt in CT - undergoing abdomen imaging for potential ileus- NT reports pt was vomiting today, will continue efforts)  [name], MS [facility] SLP Acute Rehab Services Office [phone] Pager [phone]  [name] 01/06/2021, 4:45 PM

## 2021-01-07 LAB — CULTURE, BLOOD (ROUTINE X 2)
Culture: NO GROWTH
Special Requests: ADEQUATE

## 2021-01-07 LAB — COMPREHENSIVE METABOLIC PANEL
ALT: 118 U/L — ABNORMAL HIGH (ref 0–44)
AST: 403 U/L — ABNORMAL HIGH (ref 15–41)
Albumin: 2.8 g/dL — ABNORMAL LOW (ref 3.5–5.0)
Alkaline Phosphatase: 86 U/L (ref 38–126)
Anion gap: 7 (ref 5–15)
BUN: 8 mg/dL (ref 6–20)
CO2: 29 mmol/L (ref 22–32)
Calcium: 8.7 mg/dL — ABNORMAL LOW (ref 8.9–10.3)
Chloride: 97 mmol/L — ABNORMAL LOW (ref 98–111)
Creatinine, Ser: 0.33 mg/dL — ABNORMAL LOW (ref 0.44–1.00)
GFR, Estimated: >60 mL/min (ref >60)
Glucose, Bld: 105 mg/dL — ABNORMAL HIGH (ref 70–99)
Potassium: 3.8 mmol/L (ref 3.5–5.1)
Sodium: 133 mmol/L — ABNORMAL LOW (ref 135–145)
Total Bilirubin: 1.5 mg/dL — ABNORMAL HIGH (ref 0.3–1.2)
Total Protein: 7.9 g/dL (ref 6.5–8.1)

## 2021-01-07 MED ORDER — HYDROCORTISONE ACETATE 25 MG RE SUPP
25.0000 mg | Freq: Two times a day (BID) | RECTAL | Status: DC
  Administered 2021-01-08 – 2021-01-22 (×8): 25 mg via RECTAL
  Filled 2021-01-07 (×40): qty 1

## 2021-01-07 NOTE — Progress Notes (Signed)
PROGRESS NOTE    Carla Little  HWK:[id number] DOB: 1990/10/02 DOA: 12/09/2020 PCP: Patient, No Pcp Per (Inactive)   Brief Narrative: Carla Little is a 30 y.o. female with a history of morbid obesity, recent left knee dislocation. Patient presented secondary to weakness with presentation concerning for Guillain Barre syndrome. Neurology consulted and patient started on IVIG. She was also found to have vitamin B12 and folate deficiencies. Patient is now awaiting rehab options for discharge.   Assessment & Plan:   Principal Problem:   Intractable vomiting with nausea Active Problems:   Left knee dislocation   PUD (peptic ulcer disease)   Nonalcoholic steatohepatitis (NASH)   Class 3 obesity (HCC)   Sinus tachycardia   Hypokalemia   GERD (gastroesophageal reflux disease)   Iron deficiency anemia due to chronic blood loss   E. coli UTI   Guillain Barr syndrome (HCC)   Guillain Barre syndrome Neuropathy Neurology consulted and recommended IVIG, for which patient completed 5 day course. Also recommended treating confounding diagnoses of vitamin B12 and folate deficiency.  -Continue Lyrica 50 mg TID -Neurology (signed off 10/2): Vitamin B12 qweek for 4 weeks, then monthly Vitamin B12 injections.  Intractable nausea and vomiting Initially resolved, recurrent. Associated generalized abdominal pain. Improved again. -Discontinue IV fluids  Rectal inflammation Seen on CT imaging. Rectal thickening extending into rectosigmoid junction. Patient does mention pain with having bowel movements/wiping but otherwise did not have symptoms. She reports history of no anal related sexual activity. -GI consult  Depression/anxiety Evaluated by psychiatry -Continue Remeron and Buspar  Shortness of breath Transient. Resolved.  Left knee dislocation Patient previously evaluated by orthopedic surgery with recommendation for hinge brace and to allow for joint scarring. Currently  weight bearing as tolerated with limited flexion of left knee.  Sinus tachycardia This has been managed with metoprolol. Unsure of etiology. Possibly related to deconditioning. No concerning symptoms. -Discontinue telemetry  Iron deficiency anemia Related to heavy menstruation. Hemoglobin stable.  Elevated AST/ALT NAFLD AST/ALT continue to trend upwards. No associated symptoms. Abdominal ultrasound (10/11) significant for fatty liver disease and cholelithiasis without cholecystitis. INR normal. Bilirubin stable and AST/ALT now trending down. Some abdominal pain starting last night  -CMP daily -GI consult  B12 deficiency Folate deficiency B12 and folate of 153 and 3.2 respectively. Patient started on Vitamin B12 and folic acid supplementation  E. Coli UTI Completed treatment with Ceftriaxone and transitioned to Cefazolin.  Overnight hypoxia Likely patient has OSA and/or OHS judging by symptoms and body habitus. Currently managed with oxygen overnight while asleep. CT abdomen/pelvis significant for evidence of hypoventilation. -Trial CPAP qhs  Hypocalcemia Mild and likely related to hypoalbuminemia.  LE edema Possibly related to hypoalbuminemia. No evidence of kidney or heart impairment. Liver does have some disease; normal bilirubin. No ascites on ultrasound.  Possible oral candidiasis Treated with Diflucan IV. Seems to be resolved.  Positive blood culture result 1/4 samples significant for staphylococcus epidermidis. Unlikely active infection.  Morbid obesity Body mass index is 75.91 kg/m. Dietitian consulted. -Dietitian recommendations (10/12): Liberalize diet to regular to promote oral intake Will request food be chopped into bite sized pieces and dining services alert nursing staff when tray is delivered Nursing staff to assist with set-up and feeeding of meals Increase Ensure Enlive po to TID, each supplement provides 350 kcal and 20 grams of protein Continue MVI  regimen  DVT prophylaxis: Lovenox Code Status:   Code Status: Full Code Family Communication: None at bedside Disposition Plan: Discharge to SNF  when bed is available if remains stable.   Consultants:  Neurology Psychiatry Gastroenterology  Procedures:  None  Antimicrobials: Ceftriaxone Ancef Diflucan Flagyl    Subjective: No vomiting overnight. Feels well this morning. We discussed her lack of oral intake and issue is mostly lack of appetite. Remeron has not helped much. She is willing to try to eat even when not hungry to help with nutrition  Objective: Vitals:   01/06/21 1425 01/06/21 2202 01/07/21 0500 01/07/21 0522  BP: (!) 131/92 (!) 145/97  (!) 144/80  Pulse: 96 95  (!) 107  Resp: 19 20  20   Temp: 98.9 F (37.2 C) 98.7 F (37.1 C)  98.9 F (37.2 C)  TempSrc: Oral Oral  Oral  SpO2: 96% 100%  100%  Weight:   (!) 200.6 kg   Height:        Intake/Output Summary (Last 24 hours) at 01/07/2021 0944 Last data filed at 01/07/2021 0503 Gross per 24 hour  Intake 1667.76 ml  Output 700 ml  Net 967.76 ml    Filed Weights   01/05/21 0406 01/06/21 0427 01/07/21 0500  Weight: (!) 200.2 kg (!) 196.8 kg (!) 200.6 kg    Examination:  General exam: Appears calm and comfortable Respiratory system: Clear to auscultation. Respiratory effort normal. Cardiovascular system: S1 & S2 heard, normal rhythm with slightly fast rate. No murmurs, rubs, gallops or clicks. Gastrointestinal system: Abdomen is nondistended, soft and nontender. No organomegaly or masses felt. Normal bowel sounds heard. Central nervous system: Alert and oriented. No focal neurological deficits. Musculoskeletal: Edematous. No calf tenderness Skin: No cyanosis. No rashes Psychiatry: Judgement and insight appear normal. Blunt affect    Data Reviewed: I have personally reviewed following labs and imaging studies  CBC Lab Results  Component Value Date   WBC 8.4 01/03/2021   RBC 2.82 (L) 01/03/2021    HGB 8.7 (L) 01/03/2021   HCT 29.7 (L) 01/03/2021   MCV 105.3 (H) 01/03/2021   MCH 30.9 01/03/2021   PLT 562 (H) 01/03/2021   MCHC 29.3 (L) 01/03/2021   RDW 22.2 (H) 01/03/2021   LYMPHSABS 2.0 01/03/2021   MONOABS 0.9 01/03/2021   EOSABS 0.0 01/03/2021   BASOSABS 0.0 01/03/2021     Last metabolic panel Lab Results  Component Value Date   NA 133 (L) 01/07/2021   K 3.8 01/07/2021   CL 97 (L) 01/07/2021   CO2 29 01/07/2021   BUN 8 01/07/2021   CREATININE 0.33 (L) 01/07/2021   GLUCOSE 105 (H) 01/07/2021   GFRNONAA >60 01/07/2021   GFRAA >60 05/16/2018   CALCIUM 8.7 (L) 01/07/2021   PHOS 2.9 12/14/2020   PROT 7.9 01/07/2021   ALBUMIN 2.8 (L) 01/07/2021   BILITOT 1.5 (H) 01/07/2021   ALKPHOS 86 01/07/2021   AST 403 (H) 01/07/2021   ALT 118 (H) 01/07/2021   ANIONGAP 7 01/07/2021    CBG (last 3)  No results for input(s): GLUCAP in the last 72 hours.   GFR: Estimated Creatinine Clearance: 181.9 mL/min (A) (by C-G formula based on SCr of 0.33 mg/dL (L)).  Coagulation Profile: Recent Labs  Lab 01/04/21 1428  INR 1.0     Recent Results (from the past 240 hour(s))  Culture, blood (routine x 2)     Status: Abnormal   Collection Time: 01/02/21  9:08 AM   Specimen: BLOOD LEFT HAND  Result Value Ref Range Status   Specimen Description   Final    BLOOD LEFT HAND Performed at [facility], [address] [place], [place] [address]    Special Requests   Final    BOTTLES DRAWN AEROBIC ONLY Blood Culture adequate volume Performed at [facility], [address], [place], [place] [address]    Culture  Setup Time   Final    GRAM POSITIVE COCCI IN CLUSTERS AEROBIC BOTTLE ONLY CRITICAL RESULT CALLED TO, READ BACK BY AND VERIFIED WITH: PHARMD DREW W. [phone] [id number] FCP    Culture (A)  Final    STAPHYLOCOCCUS EPIDERMIDIS THE SIGNIFICANCE OF ISOLATING THIS ORGANISM FROM A SINGLE SET OF BLOOD CULTURES WHEN MULTIPLE SETS ARE DRAWN IS  UNCERTAIN. PLEASE NOTIFY THE MICROBIOLOGY DEPARTMENT WITHIN ONE WEEK IF SPECIATION AND SENSITIVITIES ARE REQUIRED. Performed at [facility], [address], [place], [place] [address]    Report Status 01/05/2021 FINAL  Final  Culture, blood (routine x 2)     Status: None   Collection Time: 01/02/21  9:08 AM   Specimen: BLOOD RIGHT HAND  Result Value Ref Range Status   Specimen Description   Final    BLOOD RIGHT HAND Performed at [facility], [address], [place], [place] [address]    Special Requests   Final    BOTTLES DRAWN AEROBIC AND ANAEROBIC Blood Culture adequate volume Performed at [facility], [address] [place], [place] [address]    Culture   Final    NO GROWTH 5 DAYS Performed at [facility], [address], [place], [place] [address]    Report Status 01/07/2021 FINAL  Final  Blood Culture ID Panel (Reflexed)     Status: Abnormal   Collection Time: 01/02/21  9:08 AM  Result Value Ref Range Status   Enterococcus faecalis NOT DETECTED NOT DETECTED Final   Enterococcus Faecium NOT DETECTED NOT DETECTED Final   Listeria monocytogenes NOT DETECTED NOT DETECTED Final   Staphylococcus species DETECTED (A) NOT DETECTED Final    Comment: CRITICAL RESULT CALLED TO, READ BACK BY AND VERIFIED WITH: PHARMD DREW W. [phone] [id number] FCP    Staphylococcus aureus (BCID) NOT DETECTED NOT DETECTED Final   Staphylococcus epidermidis DETECTED (A) NOT DETECTED Final    Comment: Methicillin (oxacillin) resistant coagulase negative staphylococcus. Possible blood culture contaminant (unless isolated from more than one blood culture draw or clinical case suggests pathogenicity). No antibiotic treatment is indicated for blood  culture contaminants. CRITICAL RESULT CALLED TO, READ BACK BY AND VERIFIED WITH: PHARMD DREW W. [id number] FCP    Staphylococcus lugdunensis NOT DETECTED NOT DETECTED Final   Streptococcus species NOT DETECTED NOT  DETECTED Final   Streptococcus agalactiae NOT DETECTED NOT DETECTED Final   Streptococcus pneumoniae NOT DETECTED NOT DETECTED Final   Streptococcus pyogenes NOT DETECTED NOT DETECTED Final   A.calcoaceticus-baumannii NOT DETECTED NOT DETECTED Final   Bacteroides fragilis NOT DETECTED NOT DETECTED Final   Enterobacterales NOT DETECTED NOT DETECTED Final   Enterobacter cloacae complex NOT DETECTED NOT DETECTED Final   Escherichia coli NOT DETECTED NOT DETECTED Final   Klebsiella aerogenes NOT DETECTED NOT DETECTED Final   Klebsiella oxytoca NOT DETECTED NOT DETECTED Final   Klebsiella pneumoniae NOT DETECTED NOT DETECTED Final   Proteus species NOT DETECTED NOT DETECTED Final   Salmonella species NOT DETECTED NOT DETECTED Final   Serratia marcescens NOT DETECTED NOT DETECTED Final   Haemophilus influenzae NOT DETECTED NOT DETECTED Final   Neisseria meningitidis NOT DETECTED NOT DETECTED Final   Pseudomonas aeruginosa NOT DETECTED NOT DETECTED  Final   Stenotrophomonas maltophilia NOT DETECTED NOT DETECTED Final   Candida albicans NOT DETECTED NOT DETECTED Final   Candida auris NOT DETECTED NOT DETECTED Final   Candida glabrata NOT DETECTED NOT DETECTED Final   Candida krusei NOT DETECTED NOT DETECTED Final   Candida parapsilosis NOT DETECTED NOT DETECTED Final   Candida tropicalis NOT DETECTED NOT DETECTED Final   Cryptococcus neoformans/gattii NOT DETECTED NOT DETECTED Final   Methicillin resistance mecA/C DETECTED (A) NOT DETECTED Final    Comment: CRITICAL RESULT CALLED TO, READ BACK BY AND VERIFIED WITH: PHARMD DREW W. [phone] [id number] FCP Performed at [facility], [address] [place], [place] [address]   Resp Panel by RT-PCR (Flu A&B, Covid) Nasopharyngeal Swab     Status: None   Collection Time: 01/06/21 10:09 AM   Specimen: Nasopharyngeal Swab; Nasopharyngeal(NP) swabs in vial transport medium  Result Value Ref Range Status   SARS Coronavirus 2 by RT PCR NEGATIVE  NEGATIVE Final    Comment: (NOTE) SARS-CoV-2 target nucleic acids are NOT DETECTED.  The SARS-CoV-2 RNA is generally detectable in upper respiratory specimens during the acute phase of infection. The lowest concentration of SARS-CoV-2 viral copies this assay can detect is 138 copies/mL. A negative result does not preclude SARS-Cov-2 infection and should not be used as the sole basis for treatment or other patient management decisions. A negative result may occur with  improper specimen collection/handling, submission of specimen other than nasopharyngeal swab, presence of viral mutation(s) within the areas targeted by this assay, and inadequate number of viral copies(<138 copies/mL). A negative result must be combined with clinical observations, patient history, and epidemiological information. The expected result is Negative.  Fact Sheet for Patients:  BloggerCourse.com  Fact Sheet for Healthcare Providers:  SeriousBroker.it  This test is no t yet approved or cleared by the [place] FDA and  has been authorized for detection and/or diagnosis of SARS-CoV-2 by FDA under an Emergency Use Authorization (EUA). This EUA will remain  in effect (meaning this test can be used) for the duration of the COVID-19 declaration under Section 564(b)(1) of the Act, 21 U.S.C.section 360bbb-3(b)(1), unless the authorization is terminated  or revoked sooner.       Influenza A by PCR NEGATIVE NEGATIVE Final   Influenza B by PCR NEGATIVE NEGATIVE Final    Comment: (NOTE) The Xpert Xpress SARS-CoV-2/FLU/RSV plus assay is intended as an aid in the diagnosis of influenza from Nasopharyngeal swab specimens and should not be used as a sole basis for treatment. Nasal washings and aspirates are unacceptable for Xpert Xpress SARS-CoV-2/FLU/RSV testing.  Fact Sheet for Patients: BloggerCourse.com  Fact Sheet for Healthcare  Providers: SeriousBroker.it  This test is not yet approved or cleared by the [place] FDA and has been authorized for detection and/or diagnosis of SARS-CoV-2 by FDA under an Emergency Use Authorization (EUA). This EUA will remain in effect (meaning this test can be used) for the duration of the COVID-19 declaration under Section 564(b)(1) of the Act, 21 U.S.C. section 360bbb-3(b)(1), unless the authorization is terminated or revoked.  Performed at [facility], [address], [place], [place] [address]         Radiology Studies: CT ABDOMEN PELVIS W CONTRAST  Result Date: 01/06/2021 CLINICAL DATA:  Abdominal pain, nausea and vomiting. Bowel obstruction suspected. EXAM: CT ABDOMEN AND PELVIS WITH CONTRAST TECHNIQUE: Multidetector CT imaging of the abdomen and pelvis was performed using the standard protocol following bolus administration of intravenous contrast.  CONTRAST:  OMNIPAQUE IOHEXOL 350 MG/ML SOLN COMPARISON:  Ultrasound January 03, 2021.  CT December 09, 2020 FINDINGS: Lower chest: Hypoventilatory change in the dependent lungs. Hepatobiliary: Hepatomegaly with diffuse hepatic steatosis. Gallbladder is unremarkable. No biliary ductal dilation. Pancreas: No pancreatic ductal dilatation or surrounding inflammatory changes. Spleen: Within normal limits. Adrenals/Urinary Tract: Adrenal glands are unremarkable. Kidneys are normal, without renal calculi, solid enhancing lesion, or hydronephrosis. Bladder is unremarkable for degree of distension. Stomach/Bowel: Stomach is decompressed otherwise unremarkable. No pathologic dilation of small or large bowel. Appendix appears normal. Terminal ileum appears normal. Rectal wall thickening extending into the rectosigmoid junction. Vascular/Lymphatic: No abdominal aortic aneurysm. No pathologically enlarged abdominal or pelvic lymph nodes. Reproductive: Uterus and bilateral adnexa are  unremarkable. Other: No abdominal wall hernia or abnormality. No abdominopelvic ascites. Sequela of subcutaneous injections in the anterior abdominal wall. Musculoskeletal: No acute or significant osseous findings. IMPRESSION: 1. Rectal wall thickening extending into the rectosigmoid junction, which may reflect infectious or inflammatory proctitis. 2. Hepatomegaly with diffuse hepatic steatosis. Electronically Signed   By: Maudry Mayhew M.D.   On: 01/06/2021 19:29   DG Abd Portable 1V  Result Date: 01/06/2021 CLINICAL DATA:  Abdominal pain, nausea, vomiting EXAM: PORTABLE ABDOMEN - 1 VIEW COMPARISON:  None. FINDINGS: Examination is limited by body habitus. Within this limitation, there are distended loops of small bowel in the central abdomen, largest loops measuring up to 5.7 cm. There is gas present in the colon to the rectum. No obvious free air in the abdomen. IMPRESSION: Examination is limited by body habitus. Within this limitation, there are distended loops of small bowel in the central abdomen, largest loops measuring up to 5.7 cm. Six there is gas present in the colon to the rectum. Findings are most consistent with ileus. No obvious free air in the abdomen. Electronically Signed   By: Jearld Lesch M.D.   On: 01/06/2021 12:33        Scheduled Meds:  bisacodyl  10 mg Rectal Once   busPIRone  5 mg Oral BID   cyanocobalamin  1,000 mcg Intramuscular Weekly   enoxaparin (LOVENOX) injection  50 mg Subcutaneous Q12H   feeding supplement  237 mL Oral TID BM   folic acid  1 mg Oral Daily   lidocaine  1 patch Transdermal Daily   magic mouthwash w/lidocaine  5 mL Oral TID AC   metoprolol tartrate  25 mg Oral BID   mirtazapine  15 mg Oral QHS   multivitamin  15 mL Oral Daily   pantoprazole  40 mg Oral BID   polyethylene glycol  17 g Oral Daily   pregabalin  50 mg Oral TID   senna-docusate  1 tablet Oral BID   sodium chloride flush  3 mL Intravenous Q12H   Continuous Infusions:  sodium  chloride 75 mL/hr at 01/07/21 0332   sodium chloride       LOS: 28 days     Jacquelin Hawking, MD [facility] 01/07/2021, 9:44 AM  If 7PM-7AM, please contact night-coverage [email or web address]

## 2021-01-07 NOTE — Progress Notes (Signed)
Patient is nervous about closterphobia. Patient does not want to wear CPAP for tonight. RN aware. Patient is wearing oxygen and is in no distress.

## 2021-01-07 NOTE — Progress Notes (Signed)
NIF -50 FVC 1.4

## 2021-01-08 ENCOUNTER — Inpatient Hospital Stay (HOSPITAL_COMMUNITY): Payer: Medicaid Other

## 2021-01-08 DIAGNOSIS — M79605 Pain in left leg: Secondary | ICD-10-CM

## 2021-01-08 DIAGNOSIS — R609 Edema, unspecified: Secondary | ICD-10-CM

## 2021-01-08 DIAGNOSIS — M79604 Pain in right leg: Secondary | ICD-10-CM

## 2021-01-08 LAB — CBC
HCT: 30.8 % — ABNORMAL LOW (ref 36.0–46.0)
Hemoglobin: 9.1 g/dL — ABNORMAL LOW (ref 12.0–15.0)
MCH: 29.4 pg (ref 26.0–34.0)
MCHC: 29.5 g/dL — ABNORMAL LOW (ref 30.0–36.0)
MCV: 99.7 fL (ref 80.0–100.0)
Platelets: 452 10*3/uL — ABNORMAL HIGH (ref 150–400)
RBC: 3.09 MIL/uL — ABNORMAL LOW (ref 3.87–5.11)
RDW: 20.9 % — ABNORMAL HIGH (ref 11.5–15.5)
WBC: 7.6 10*3/uL (ref 4.0–10.5)
nRBC: 0 % (ref 0.0–0.2)

## 2021-01-08 LAB — COMPREHENSIVE METABOLIC PANEL
ALT: 121 U/L — ABNORMAL HIGH (ref 0–44)
AST: 407 U/L — ABNORMAL HIGH (ref 15–41)
Albumin: 2.9 g/dL — ABNORMAL LOW (ref 3.5–5.0)
Alkaline Phosphatase: 77 U/L (ref 38–126)
Anion gap: 7 (ref 5–15)
BUN: 7 mg/dL (ref 6–20)
CO2: 30 mmol/L (ref 22–32)
Calcium: 8.8 mg/dL — ABNORMAL LOW (ref 8.9–10.3)
Chloride: 97 mmol/L — ABNORMAL LOW (ref 98–111)
Creatinine, Ser: <0.3 mg/dL — ABNORMAL LOW (ref 0.44–1.00)
Glucose, Bld: 90 mg/dL (ref 70–99)
Potassium: 3.4 mmol/L — ABNORMAL LOW (ref 3.5–5.1)
Sodium: 134 mmol/L — ABNORMAL LOW (ref 135–145)
Total Bilirubin: 1.7 mg/dL — ABNORMAL HIGH (ref 0.3–1.2)
Total Protein: 8 g/dL (ref 6.5–8.1)

## 2021-01-08 LAB — SEDIMENTATION RATE: Sed Rate: 90 mm/hr — ABNORMAL HIGH (ref 0–22)

## 2021-01-08 LAB — C-REACTIVE PROTEIN: CRP: 5.7 mg/dL — ABNORMAL HIGH (ref <1.0)

## 2021-01-08 MED ORDER — POTASSIUM CHLORIDE 20 MEQ PO PACK
40.0000 meq | PACK | Freq: Once | ORAL | Status: AC
  Administered 2021-01-08: 40 meq via ORAL
  Filled 2021-01-08: qty 2

## 2021-01-08 MED ORDER — MIRTAZAPINE 15 MG PO TABS
30.0000 mg | ORAL_TABLET | Freq: Every day | ORAL | Status: DC
  Administered 2021-01-08 – 2021-01-11 (×4): 30 mg via ORAL
  Filled 2021-01-08 (×4): qty 2

## 2021-01-08 MED ORDER — DIPHENHYDRAMINE HCL 12.5 MG/5ML PO ELIX
12.5000 mg | ORAL_SOLUTION | Freq: Four times a day (QID) | ORAL | Status: DC | PRN
  Administered 2021-01-08 – 2021-01-25 (×3): 12.5 mg via ORAL
  Filled 2021-01-08 (×4): qty 5

## 2021-01-08 NOTE — Progress Notes (Signed)
NIF -40 FVC 1.6L

## 2021-01-08 NOTE — Plan of Care (Signed)

## 2021-01-08 NOTE — Progress Notes (Signed)
Bilateral lower extremity venous duplex has been completed. Preliminary results can be found in CV Proc through chart review.   01/08/21 9:43 AM Olen Cordial RVT

## 2021-01-08 NOTE — Progress Notes (Signed)
Pt continues to refuse CPAP QHS.  

## 2021-01-08 NOTE — Progress Notes (Signed)
PROGRESS NOTE    Carla Little  EBR:[id number] DOB: 05/12/1990 DOA: 12/09/2020 PCP: Patient, No Pcp Per (Inactive)   Brief Narrative: Carla Little is a 30 y.o. female with a history of morbid obesity, recent left knee dislocation. Patient presented secondary to weakness with presentation concerning for Guillain Barre syndrome. Neurology consulted and patient started on IVIG. She was also found to have vitamin B12 and folate deficiencies. Patient is now awaiting rehab options for discharge.   Assessment & Plan:   Principal Problem:   Intractable vomiting with nausea Active Problems:   Left knee dislocation   PUD (peptic ulcer disease)   Nonalcoholic steatohepatitis (NASH)   Class 3 obesity (HCC)   Sinus tachycardia   Hypokalemia   GERD (gastroesophageal reflux disease)   Iron deficiency anemia due to chronic blood loss   E. coli UTI   Guillain Barr syndrome ([place])   Guillain Barre syndrome Neuropathy Neurology consulted and recommended IVIG, for which patient completed 5 day course. Also recommended treating confounding diagnoses of vitamin B12 and folate deficiency.  -Continue Lyrica 50 mg TID -Neurology (signed off 10/2): Vitamin B12 qweek for 4 weeks, then monthly Vitamin B12 injections.  Intractable nausea and vomiting Initially resolved, recurrent. Associated generalized abdominal pain. Improved again.  Rectal inflammation Seen on CT imaging. Rectal thickening extending into rectosigmoid junction. Patient does mention pain with having bowel movements/wiping but otherwise did not have symptoms. She reports history of no anal related sexual activity. No systemic symptoms concerning for infection. -GI consulted. Recommended Anusol suppositories. Pending today -CRP/ESR  Depression/anxiety Evaluated by psychiatry -Continue Buspar 5 mg BID -Increase from Remeron 15 mg qHS to Remeron 30 mg qHS due to lack of response; titrate up q2 weeks as  needed/tolerated  Shortness of breath Transient. Resolved.  Left knee dislocation Patient previously evaluated by orthopedic surgery with recommendation for hinge brace and to allow for joint scarring. Currently weight bearing as tolerated with limited flexion of left knee.  Sinus tachycardia This has been managed with metoprolol. Unsure of etiology. Possibly related to deconditioning. No concerning symptoms.  Iron deficiency anemia Related to heavy menstruation. Hemoglobin stable.  Elevated AST/ALT NAFLD AST/ALT continue to trend upwards. No associated symptoms. Abdominal ultrasound (10/11) significant for fatty liver disease and cholelithiasis without cholecystitis. INR normal. Bilirubin stable and AST/ALT now stable. -CMP daily -GI consult  B12 deficiency Folate deficiency B12 and folate of 153 and 3.2 respectively. Patient started on Vitamin G88 and folic acid supplementation  E. Coli UTI Completed treatment with Ceftriaxone and transitioned to Cefazolin.  Overnight hypoxia Likely patient has OSA and/or OHS judging by symptoms and body habitus. Currently managed with oxygen overnight while asleep. CT abdomen/pelvis significant for evidence of hypoventilation. Attempted to trial CPAP but patient declined treatment.  Hypocalcemia Mild and likely related to hypoalbuminemia.  Hypokalemia -Potassium supplementation  LE edema Possibly related to hypoalbuminemia. No evidence of kidney or heart impairment. Liver does have some disease; normal bilirubin. No ascites on ultrasound. Could trial Lasix, however patient already has MASD in perineal area which may worsen. Plan would be to avoid foley placement.  Bilateral leg pain Low suspicion for acute DVT, however pain is apparently new onset. Difficult to assess from examination secondary to body habitus. -BLE venous duplex  Possible oral candidiasis Treated with Diflucan IV. Seems to be resolved.  Positive blood culture  result 1/4 samples significant for staphylococcus epidermidis. Unlikely active infection.  Morbid obesity Body mass index is 75.91 kg/m. Dietitian consulted. -Dietitian recommendations (  10/12): Liberalize diet to regular to promote oral intake Will request food be chopped into bite sized pieces and dining services alert nursing staff when tray is delivered Nursing staff to assist with set-up and feeeding of meals Increase Ensure Enlive po to TID, each supplement provides 350 kcal and 20 grams of protein Continue MVI regimen   DVT prophylaxis: Lovenox Code Status:   Code Status: Full Code Family Communication: None at bedside Disposition Plan: Discharge to SNF when bed is available if remains stable.   Consultants:  Neurology Psychiatry Gastroenterology  Procedures:  None  Antimicrobials: Ceftriaxone Ancef Diflucan Flagyl    Subjective: Patient reports eating some food yesterday. Not sleeping well. She also reports no onset bilateral lower leg/calf pain which she is managing by requesting prescribed as needed analgesics.  Objective: Vitals:   01/07/21 1350 01/07/21 2124 01/07/21 2124 01/08/21 0630  BP: 131/83 127/78 127/78 (!) 146/95  Pulse: 95 97 92 92  Resp: _0 Temp: (!) 97.4 F (36.3 C)  98 F (36.7 C) 98.5 F (36.9 C)  TempSrc:   Oral   SpO2: 99%  93% 99%  Weight:      Height:        Intake/Output Summary (Last 24 hours) at 01/08/2021 1030 Last data filed at 01/08/2021 0600 Gross per 24 hour  Intake 3218.05 ml  Output --  Net 3218.05 ml    Filed Weights   01/05/21 0406 01/06/21 0427 01/07/21 0500  Weight: (!) 200.2 kg (!) 196.8 kg (!) 200.6 kg    Examination:  General exam: Appears calm and comfortable Respiratory system: Clear to auscultation. Respiratory effort normal. Cardiovascular system: S1 & S2 heard, RRR. No murmurs, rubs, gallops or clicks. Gastrointestinal system: Obese abdomen is otherwise not seemingly distended, soft and  nontender. No organomegaly or masses felt. Normal bowel sounds heard. Central nervous system: Alert and oriented. No focal neurological deficits. Musculoskeletal: Diffuse edema. No calf tenderness Skin: No cyanosis. No rashes Psychiatry: Judgement and insight appear normal. Mood & affect appropriate.     Data Reviewed: I have personally reviewed following labs and imaging studies  CBC Lab Results  Component Value Date   WBC 7.6 01/08/2021   RBC 3.09 (L) 01/08/2021   HGB 9.1 (L) 01/08/2021   HCT 30.8 (L) 01/08/2021   MCV 99.7 01/08/2021   MCH 29.4 01/08/2021   PLT 452 (H) 01/08/2021   MCHC 29.5 (L) 01/08/2021   RDW 20.9 (H) 01/08/2021   LYMPHSABS 2.0 01/03/2021   MONOABS 0.9 01/03/2021   EOSABS 0.0 01/03/2021   BASOSABS 0.0 15/40/0867     Last metabolic panel Lab Results  Component Value Date   NA 134 (L) 01/08/2021   K 3.4 (L) 01/08/2021   CL 97 (L) 01/08/2021   CO2 30 01/08/2021   BUN 7 01/08/2021   CREATININE <0.30 (L) 01/08/2021   GLUCOSE 90 01/08/2021   GFRNONAA NOT CALCULATED 01/08/2021   GFRAA >60 05/16/2018   CALCIUM 8.8 (L) 01/08/2021   PHOS 2.9 12/14/2020   PROT 8.0 01/08/2021   ALBUMIN 2.9 (L) 01/08/2021   BILITOT 1.7 (H) 01/08/2021   ALKPHOS 77 01/08/2021   AST 407 (H) 01/08/2021   ALT 121 (H) 01/08/2021   ANIONGAP 7 01/08/2021    CBG (last 3)  No results for input(s): GLUCAP in the last 72 hours.   GFR: CrCl cannot be calculated (This lab value cannot be used to calculate CrCl because it is not a number: <0.30).  Coagulation Profile: Recent  Labs  Lab 01/04/21 1428  INR 1.0     Recent Results (from the past 240 hour(s))  Culture, blood (routine x 2)     Status: Abnormal   Collection Time: 01/02/21  9:08 AM   Specimen: BLOOD LEFT HAND  Result Value Ref Range Status   Specimen Description   Final    BLOOD LEFT HAND Performed at [facility] [address] [place], [place] [address]    Special Requests   Final     BOTTLES DRAWN AEROBIC ONLY Blood Culture adequate volume Performed at [facility] [address], [place], [place] [address]    Culture  Setup Time   Final    GRAM POSITIVE COCCI IN CLUSTERS AEROBIC BOTTLE ONLY CRITICAL RESULT CALLED TO, READ BACK BY AND VERIFIED WITH: PHARMD DREW W. [phone] FCP    Culture (A)  Final    STAPHYLOCOCCUS EPIDERMIDIS THE SIGNIFICANCE OF ISOLATING THIS ORGANISM FROM A SINGLE SET OF BLOOD CULTURES WHEN MULTIPLE SETS ARE DRAWN IS UNCERTAIN. PLEASE NOTIFY THE MICROBIOLOGY DEPARTMENT WITHIN ONE WEEK IF SPECIATION AND SENSITIVITIES ARE REQUIRED. Performed at [facility], [place] [address], [place], [place] [address]    Report Status 01/05/2021 FINAL  Final  Culture, blood (routine x 2)     Status: None   Collection Time: 01/02/21  9:08 AM   Specimen: BLOOD RIGHT HAND  Result Value Ref Range Status   Specimen Description   Final    BLOOD RIGHT HAND Performed at [facility] [address], [place], [place] [address]    Special Requests   Final    BOTTLES DRAWN AEROBIC AND ANAEROBIC Blood Culture adequate volume Performed at [facility] [address], [place], [place] [address]    Culture   Final    NO GROWTH 5 DAYS Performed at [facility], [place] [address], [place], [place] [address]    Report Status 01/07/2021 FINAL  Final  Blood Culture ID Panel (Reflexed)     Status: Abnormal   Collection Time: 01/02/21  9:08 AM  Result Value Ref Range Status   Enterococcus faecalis NOT DETECTED NOT DETECTED Final   Enterococcus Faecium NOT DETECTED NOT DETECTED Final   Listeria monocytogenes NOT DETECTED NOT DETECTED Final   Staphylococcus species DETECTED (A) NOT DETECTED Final    Comment: CRITICAL RESULT CALLED TO, READ BACK BY AND VERIFIED WITH: PHARMD DREW W. [phone] FCP    Staphylococcus aureus (BCID) NOT DETECTED NOT DETECTED Final   Staphylococcus epidermidis DETECTED (A) NOT  DETECTED Final    Comment: Methicillin (oxacillin) resistant coagulase negative staphylococcus. Possible blood culture contaminant (unless isolated from more than one blood culture draw or clinical case suggests pathogenicity). No antibiotic treatment is indicated for blood  culture contaminants. CRITICAL RESULT CALLED TO, READ BACK BY AND VERIFIED WITH: PHARMD DREW W. [phone] FCP    Staphylococcus lugdunensis NOT DETECTED NOT DETECTED Final   Streptococcus species NOT DETECTED NOT DETECTED Final   Streptococcus agalactiae NOT DETECTED NOT DETECTED Final   Streptococcus pneumoniae NOT DETECTED NOT DETECTED Final   Streptococcus pyogenes NOT DETECTED NOT DETECTED Final   A.calcoaceticus-baumannii NOT DETECTED NOT DETECTED Final   Bacteroides fragilis NOT DETECTED NOT DETECTED Final   Enterobacterales NOT DETECTED NOT DETECTED Final   Enterobacter cloacae complex NOT DETECTED NOT DETECTED Final   Escherichia coli NOT DETECTED NOT DETECTED Final   Klebsiella aerogenes NOT DETECTED NOT DETECTED Final  Klebsiella oxytoca NOT DETECTED NOT DETECTED Final   Klebsiella pneumoniae NOT DETECTED NOT DETECTED Final   Proteus species NOT DETECTED NOT DETECTED Final   Salmonella species NOT DETECTED NOT DETECTED Final   Serratia marcescens NOT DETECTED NOT DETECTED Final   Haemophilus influenzae NOT DETECTED NOT DETECTED Final   Neisseria meningitidis NOT DETECTED NOT DETECTED Final   Pseudomonas aeruginosa NOT DETECTED NOT DETECTED Final   Stenotrophomonas maltophilia NOT DETECTED NOT DETECTED Final   Candida albicans NOT DETECTED NOT DETECTED Final   Candida auris NOT DETECTED NOT DETECTED Final   Candida glabrata NOT DETECTED NOT DETECTED Final   Candida krusei NOT DETECTED NOT DETECTED Final   Candida parapsilosis NOT DETECTED NOT DETECTED Final   Candida tropicalis NOT DETECTED NOT DETECTED Final   Cryptococcus neoformans/gattii NOT DETECTED NOT DETECTED Final   Methicillin resistance  mecA/C DETECTED (A) NOT DETECTED Final    Comment: CRITICAL RESULT CALLED TO, READ BACK BY AND VERIFIED WITH: PHARMD DREW W. [phone] FCP Performed at [facility], [address], [place], [place] [address]   Resp Panel by RT-PCR (Flu A&B, Covid) Nasopharyngeal Swab     Status: None   Collection Time: 01/06/21 10:09 AM   Specimen: Nasopharyngeal Swab; Nasopharyngeal(NP) swabs in vial transport medium  Result Value Ref Range Status   SARS Coronavirus 2 by RT PCR NEGATIVE NEGATIVE Final    Comment: (NOTE) SARS-CoV-2 target nucleic acids are NOT DETECTED.  The SARS-CoV-2 RNA is generally detectable in upper respiratory specimens during the acute phase of infection. The lowest concentration of SARS-CoV-2 viral copies this assay can detect is 138 copies/mL. A negative result does not preclude SARS-Cov-2 infection and should not be used as the sole basis for treatment or other patient management decisions. A negative result may occur with  improper specimen collection/handling, submission of specimen other than nasopharyngeal swab, presence of viral mutation(s) within the areas targeted by this assay, and inadequate number of viral copies(<138 copies/mL). A negative result must be combined with clinical observations, patient history, and epidemiological information. The expected result is Negative.  Fact Sheet for Patients:  EntrepreneurPulse.com.au  Fact Sheet for Healthcare Providers:  IncredibleEmployment.be  This test is no t yet approved or cleared by the [place] FDA and  has been authorized for detection and/or diagnosis of SARS-CoV-2 by FDA under an Emergency Use Authorization (EUA). This EUA will remain  in effect (meaning this test can be used) for the duration of the COVID-19 declaration under Section 564(b)(1) of the Act, 21 U.S.C.section 360bbb-3(b)(1), unless the authorization is terminated  or revoked sooner.        Influenza A by PCR NEGATIVE NEGATIVE Final   Influenza B by PCR NEGATIVE NEGATIVE Final    Comment: (NOTE) The Xpert Xpress SARS-CoV-2/FLU/RSV plus assay is intended as an aid in the diagnosis of influenza from Nasopharyngeal swab specimens and should not be used as a sole basis for treatment. Nasal washings and aspirates are unacceptable for Xpert Xpress SARS-CoV-2/FLU/RSV testing.  Fact Sheet for Patients: EntrepreneurPulse.com.au  Fact Sheet for Healthcare Providers: IncredibleEmployment.be  This test is not yet approved or cleared by the [place] FDA and has been authorized for detection and/or diagnosis of SARS-CoV-2 by FDA under an Emergency Use Authorization (EUA). This EUA will remain in effect (meaning this test can be used) for the duration of the COVID-19 declaration under Section 564(b)(1) of the Act, 21 U.S.C. section 360bbb-3(b)(1), unless the authorization is terminated or revoked.  Performed at  [facility], [place] [address] [place], [place] [address]         Radiology Studies: CT ABDOMEN PELVIS W CONTRAST  Result Date: 01/06/2021 CLINICAL DATA:  Abdominal pain, nausea and vomiting. Bowel obstruction suspected. EXAM: CT ABDOMEN AND PELVIS WITH CONTRAST TECHNIQUE: Multidetector CT imaging of the abdomen and pelvis was performed using the standard protocol following bolus administration of intravenous contrast. CONTRAST:  150m OMNIPAQUE IOHEXOL 350 MG/ML SOLN COMPARISON:  Ultrasound January 03, 2021.  CT December 09, 2020 FINDINGS: Lower chest: Hypoventilatory change in the dependent lungs. Hepatobiliary: Hepatomegaly with diffuse hepatic steatosis. Gallbladder is unremarkable. No biliary ductal dilation. Pancreas: No pancreatic ductal dilatation or surrounding inflammatory changes. Spleen: Within normal limits. Adrenals/Urinary Tract: Adrenal glands are unremarkable. Kidneys are normal, without renal  calculi, solid enhancing lesion, or hydronephrosis. Bladder is unremarkable for degree of distension. Stomach/Bowel: Stomach is decompressed otherwise unremarkable. No pathologic dilation of small or large bowel. Appendix appears normal. Terminal ileum appears normal. Rectal wall thickening extending into the rectosigmoid junction. Vascular/Lymphatic: No abdominal aortic aneurysm. No pathologically enlarged abdominal or pelvic lymph nodes. Reproductive: Uterus and bilateral adnexa are unremarkable. Other: No abdominal wall hernia or abnormality. No abdominopelvic ascites. Sequela of subcutaneous injections in the anterior abdominal wall. Musculoskeletal: No acute or significant osseous findings. IMPRESSION: 1. Rectal wall thickening extending into the rectosigmoid junction, which may reflect infectious or inflammatory proctitis. 2. Hepatomegaly with diffuse hepatic steatosis. Electronically Signed   By: JDahlia BailiffM.D.   On: 01/06/2021 19:29   VAS UK[place]LOWER EXTREMITY VENOUS (DVT)  Result Date: 01/08/2021  Lower Venous DVT Study Patient Name:  Carla Little Date of Exam:   01/08/2021 Medical Rec #: [id number]         Accession #:    [id number] of Birth: [birth year]        Patient Gender: F Patient Age:   89years [facility]      VAS [place]OWER EXTREMITY VENOUS (DVT) Referring Phys: Ameira Alessandrini --------------------------------------------------------------------------------  Indications: Edema.  Risk Factors: None identified. Limitations: Body habitus and poor ultrasound/tissue interface. Performing Technologist: GOliver HumRVT  Examination Guidelines: A complete evaluation includes B-mode imaging, spectral Doppler, color Doppler, and power Doppler as needed of all accessible portions of each vessel. Bilateral testing is considered an integral part of a complete examination. Limited examinations for reoccurring indications may be performed as noted. The reflux  portion of the exam is performed with the patient in reverse Trendelenburg.  +---------+---------------+---------+-----------+----------+-------------------+ RIGHT    CompressibilityPhasicitySpontaneityPropertiesThrombus Aging      +---------+---------------+---------+-----------+----------+-------------------+ CFV      Full           Yes      Yes                                      +---------+---------------+---------+-----------+----------+-------------------+ SFJ      Full                                                             +---------+---------------+---------+-----------+----------+-------------------+ FV Prox  Full                                                             +---------+---------------+---------+-----------+----------+-------------------+  FV Mid                  Yes      Yes                                      +---------+---------------+---------+-----------+----------+-------------------+ FV Distal               Yes      Yes                                      +---------+---------------+---------+-----------+----------+-------------------+ PFV      Full                                                             +---------+---------------+---------+-----------+----------+-------------------+ POP      Full           Yes      Yes                                      +---------+---------------+---------+-----------+----------+-------------------+ PTV      Full                                                             +---------+---------------+---------+-----------+----------+-------------------+ PERO                                                  Not well visualized +---------+---------------+---------+-----------+----------+-------------------+   +---------+---------------+---------+-----------+----------+-------------------+ LEFT     CompressibilityPhasicitySpontaneityPropertiesThrombus Aging       +---------+---------------+---------+-----------+----------+-------------------+ CFV      Full           Yes      Yes                                      +---------+---------------+---------+-----------+----------+-------------------+ SFJ      Full                                                             +---------+---------------+---------+-----------+----------+-------------------+ FV Prox  Full                                                             +---------+---------------+---------+-----------+----------+-------------------+ FV Mid  Yes      Yes                                      +---------+---------------+---------+-----------+----------+-------------------+ FV Distal               Yes      Yes                                      +---------+---------------+---------+-----------+----------+-------------------+ PFV      Full                                                             +---------+---------------+---------+-----------+----------+-------------------+ POP      Full           Yes      Yes                                      +---------+---------------+---------+-----------+----------+-------------------+ PTV                                                   Not well visualized +---------+---------------+---------+-----------+----------+-------------------+ PERO                                                  Not well visualized +---------+---------------+---------+-----------+----------+-------------------+     Summary: RIGHT: - There is no evidence of deep vein thrombosis in the lower extremity. However, portions of this examination were limited- see technologist comments above.  - No cystic structure found in the popliteal fossa.  LEFT: - There is no evidence of deep vein thrombosis in the lower extremity. However, portions of this examination were limited- see technologist comments above.  - No cystic  structure found in the popliteal fossa.  *See table(s) above for measurements and observations. Electronically signed by Monica Martinez MD on 01/08/2021 at 10:05:43 AM.    Final         Scheduled Meds:  busPIRone  5 mg Oral BID   cyanocobalamin  1,000 mcg Intramuscular Weekly   enoxaparin (LOVENOX) injection  50 mg Subcutaneous Q12H   feeding supplement  237 mL Oral TID BM   folic acid  1 mg Oral Daily   hydrocortisone  25 mg Rectal BID   lidocaine  1 patch Transdermal Daily   magic mouthwash w/lidocaine  5 mL Oral TID AC   metoprolol tartrate  25 mg Oral BID   mirtazapine  15 mg Oral QHS   multivitamin  15 mL Oral Daily   pantoprazole  40 mg Oral BID   polyethylene glycol  17 g Oral Daily   potassium chloride  40 mEq Oral Once   pregabalin  50 mg Oral TID   senna-docusate  1 tablet Oral BID   sodium chloride flush  3  mL Intravenous Q12H   Continuous Infusions:  sodium chloride 10 mL/hr at 01/07/21 1710     LOS: 29 days     Cordelia Poche, MD [facility] 01/08/2021, 10:30 AM  If 7PM-7AM, please contact night-coverage [email or web address]

## 2021-01-09 LAB — COMPREHENSIVE METABOLIC PANEL
ALT: 122 U/L — ABNORMAL HIGH (ref 0–44)
AST: 374 U/L — ABNORMAL HIGH (ref 15–41)
Albumin: 2.9 g/dL — ABNORMAL LOW (ref 3.5–5.0)
Alkaline Phosphatase: 74 U/L (ref 38–126)
Anion gap: 6 (ref 5–15)
BUN: <5 mg/dL — ABNORMAL LOW (ref 6–20)
CO2: 28 mmol/L (ref 22–32)
Calcium: 8.6 mg/dL — ABNORMAL LOW (ref 8.9–10.3)
Chloride: 98 mmol/L (ref 98–111)
Creatinine, Ser: <0.3 mg/dL — ABNORMAL LOW (ref 0.44–1.00)
Glucose, Bld: 98 mg/dL (ref 70–99)
Potassium: 3.8 mmol/L (ref 3.5–5.1)
Sodium: 132 mmol/L — ABNORMAL LOW (ref 135–145)
Total Bilirubin: 1.5 mg/dL — ABNORMAL HIGH (ref 0.3–1.2)
Total Protein: 7.9 g/dL (ref 6.5–8.1)

## 2021-01-09 MED ORDER — POLYETHYLENE GLYCOL 3350 17 G PO PACK
17.0000 g | PACK | Freq: Every day | ORAL | Status: DC
  Administered 2021-01-15 – 2021-01-22 (×3): 17 g via ORAL
  Filled 2021-01-09 (×12): qty 1

## 2021-01-09 MED ORDER — PEG 3350-KCL-NA BICARB-NACL 420 G PO SOLR: 4000.0000 mL | Freq: Once | ORAL | Status: DC

## 2021-01-09 MED ORDER — FLEET ENEMA 7-19 GM/118ML RE ENEM
1.0000 | ENEMA | Freq: Once | RECTAL | Status: AC
  Administered 2021-01-10: 1 via RECTAL

## 2021-01-09 MED ORDER — POLYETHYLENE GLYCOL 3350 17 G PO PACK
17.0000 g | PACK | Freq: Once | ORAL | Status: AC
  Administered 2021-01-10: 17 g via ORAL
  Filled 2021-01-09: qty 1

## 2021-01-09 MED ORDER — FLEET ENEMA 7-19 GM/118ML RE ENEM
1.0000 | ENEMA | Freq: Once | RECTAL | Status: AC
  Administered 2021-01-10: 1 via RECTAL
  Filled 2021-01-09: qty 1

## 2021-01-09 NOTE — Progress Notes (Addendum)
Physical Therapy Treatment Patient Details Name: Carla Little MRN: [id number] DOB: 1990-07-16 Today's Date: 01/09/2021   History of Present Illness Patient is a 30 y.o. female who presented to [facility] for intractable N/V on 9/17. ED labwork reveals e. coli UTI. Pt had recent hospital admission from 8/8-9/5 due to Lt knee dislocation which was reduced with fractures of the proximal fibula ligamentous avulsion laterally as well as medially off the medial femoral condyle and MRI showed complete ACL & PCL tears and MCL strain. That hospital admission was complicated by nausea, vomiting, tachycardia, UTI, and fecal impaction;. PMH significant for morbid obesity, GERD, fatty liver disease, depression. Patient now with Guillian-Barre syndrome    PT Comments    Today's PT treatment focused on bed mobility/rolling, UE coordination, trunk control and unsupported sitting, and AAROM of B Les. Pt able to perform ankle pumps and AAROM hip ABD/knee flexion, but asking therapist "am I doing it" due to poor sensation and proprioception. Pt will benefit from continued skilled PT to increase their independence and maximize safety with mobility.     Recommendations for follow up therapy are one component of a multi-disciplinary discharge planning process, led by the attending physician.  Recommendations may be updated based on patient status, additional functional criteria and insurance authorization.  Follow Up Recommendations  SNF     Equipment Recommendations  Wheelchair cushion (measurements PT);Wheelchair (measurements PT)    Recommendations for Other Services       Precautions / Restrictions Precautions Precautions: Fall Precaution Comments: Back pain.  go slow with sitting in  somewhat chair position, can slide  down, be sure legs and feet are secure to foot board with folded blankets Restrictions Weight Bearing Restrictions: No LLE Weight Bearing: Weight bearing as tolerated     Mobility   Bed Mobility Overal bed mobility: Needs Assistance Bed Mobility: Rolling Rolling: Max assist;Total assist;+2 for physical assistance;+2 for safety/equipment         General bed mobility comments: MAX A +2 for rolling L/R to assist with pericare, hand over hand facilitation for grabbing handrails, pt able to maintain sidelying with use of UEs with MIN assist to maintain at hips. Noted grip strength to decrease with prolonged time.    Transfers                 General transfer comment: pt positioned in chair position for session.  Ambulation/Gait                 Stairs             Wheelchair Mobility    Modified Rankin (Stroke Patients Only)       Balance Overall balance assessment: Needs assistance Sitting-balance support: Bilateral upper extremity supported Sitting balance-Leahy Scale: Poor                                      Cognition Arousal/Alertness: Awake/alert Behavior During Therapy: Flat affect Overall Cognitive Status: Within Functional Limits for tasks assessed                                 General Comments: patient very flat, not very verbal today. Improved toward end of session      Exercises General Exercises - Lower Extremity Ankle Circles/Pumps: AROM;Both;10 reps (chair position) Heel Slides: AAROM;Both;10 reps (chair position) Hip ABduction/ADduction: AAROM;Both;10 reps (  chair position) Other Exercises Other Exercises: UE coordination with controlled finger to nose x5 each side, observed intention tremor R>L. Pt able to grip and hold cup of water during session as well. Other Exercises: UE bedrail pull forward- assist for hand placement and lateral weight shift to reach rail and pt cued for forward weight shifiting and use of UEs with MOD A+2 to bring back off of bed for unsupported sitting. Pt able to maintain with assist for ~20s-30s max this session.    General Comments General comments (skin  integrity, edema, etc.): pt reporting decreased sensation in fingers and LEs compared to normal, pt with intact sensation to light touch on lateral side of B LEs, but decreased medially. Pt able to feel pressure sensation in B LEs.      Pertinent Vitals/Pain Pain Assessment: Faces Faces Pain Scale: Hurts a little bit Pain Location: back Pain Descriptors / Indicators: Discomfort Pain Intervention(s): Limited activity within patient's tolerance;Monitored during session;Repositioned    Home Living                      Prior Function            PT Goals (current goals can now be found in the care plan section) Acute Rehab PT Goals Patient Stated Goal: to walk PT Goal Formulation: With patient Time For Goal Achievement: 01/19/21 Potential to Achieve Goals: Fair Progress towards PT goals: Progressing toward goals    Frequency    Min 2X/week      PT Plan Current plan remains appropriate    Co-evaluation              AM-PAC PT "6 Clicks" Mobility   Outcome Measure  Help needed turning from your back to your side while in a flat bed without using bedrails?: Total Help needed moving from lying on your back to sitting on the side of a flat bed without using bedrails?: Total Help needed moving to and from a bed to a chair (including a wheelchair)?: Total Help needed standing up from a chair using your arms (e.g., wheelchair or bedside chair)?: Total Help needed to walk in hospital room?: Total Help needed climbing 3-5 steps with a railing? : Total 6 Click Score: 6    End of Session   Activity Tolerance: Patient tolerated treatment well Patient left: in bed;with call bell/phone within reach Nurse Communication: Mobility status PT Visit Diagnosis: Muscle weakness (generalized) (M62.81)     Time: 6948-5462 PT Time Calculation (min) (ACUTE ONLY): 32 min  Charges:  $Therapeutic Exercise: 8-22 mins $Therapeutic Activity: 8-22 mins                     Lyman Speller PT, DPT  Acute Rehabilitation Services  Office [phone]  01/09/2021, 6:28 PM

## 2021-01-09 NOTE — Consult Note (Signed)
Referring Provider: [facility] Primary Care Physician:  Patient, No Pcp Per (Inactive) Primary Gastroenterologist:  Althia Forts  Reason for Consultation:  Intractable vomiting with nausea, abnormal CT scan  HPI: Carla Little is a 30 y.o. female medical history significant of class III obesity, depression, GERD, nonalcoholic steatosis presents for intractable nausea and vomiting.  Patient was previously admitted to hospital 10/2020 for a knee dislocation after a mechanical fall with MCL strain and ACL/PCL rupture. She began having nausea and vomiting and underwent EGD with Dr. Collene Mares 11/19/20: LA Grade B reflux esophagitis with no bleeding. One small non-bleeding gastric ulcer in the antrum-biopsy showed reactive gastropathy, negative for H. Pylori. Normal examined duodenum. She was sent home.  For this admission, patient originally presented to ED with weakness and presentation concerning for Guillain Barre Syndrome. Patient was started on IVIG. She was still having nausea and vomiting. States she used to take NSAIDs daily, but has stopped since her EGD. She denies constipation/diarrhea. Denies melena/hematochezia. Denies unintentional weight loss. CT scan showed rectal wall thickening. Patient states her maternal aunt has a history of rectal cancer. Unknown exact age, but patient states she was "older."  No previous history of colonoscopy.  Past Medical History:  Diagnosis Date   Class 3 obesity 12/09/2020   Depression    GERD (gastroesophageal reflux disease)    Nonalcoholic steatohepatitis (NASH) 12/09/2020   Obesity    PUD (peptic ulcer disease) 12/09/2020    Past Surgical History:  Procedure Laterality Date   BIOPSY  11/19/2020   Procedure: BIOPSY;  Surgeon: Juanita Craver, MD;  Location: [facility] ENDOSCOPY;  Service: Endoscopy;;   ESOPHAGOGASTRODUODENOSCOPY (EGD) WITH PROPOFOL N/A 11/19/2020   Procedure: ESOPHAGOGASTRODUODENOSCOPY (EGD) WITH PROPOFOL;  Surgeon: Juanita Craver, MD;  Location: [facility]  ENDOSCOPY;  Service: Endoscopy;  Laterality: N/A;    Prior to Admission medications   Medication Sig Start Date End Date Taking? Authorizing Provider  cholecalciferol (VITAMIN D) 25 MCG tablet Take 2 tablets (2,000 Units total) by mouth daily. 11/28/20   Shawna Clamp, MD  methocarbamol (ROBAXIN) 500 MG tablet Take 1 tablet (500 mg total) by mouth every 6 (six) hours as needed for muscle spasms. 11/28/20   Shawna Clamp, MD  metoprolol tartrate (LOPRESSOR) 25 MG tablet Take 0.5 tablets (12.5 mg total) by mouth 2 (two) times daily. 11/28/20   Shawna Clamp, MD  ondansetron (ZOFRAN ODT) 4 MG disintegrating tablet Take 1 tablet (4 mg total) by mouth every 8 (eight) hours as needed for nausea or vomiting. 11/29/20   Charlann Lange, PA-C  ondansetron (ZOFRAN) 4 MG tablet Take 1 tablet (4 mg total) by mouth every 6 (six) hours as needed for nausea. Patient not taking: Reported on 11/29/2020 11/28/20   Shawna Clamp, MD  oxyCODONE (OXY IR/ROXICODONE) 5 MG immediate release tablet Take 1 tablet (5 mg total) by mouth every 4 (four) hours as needed for moderate pain (pain score 4-6). 11/29/20   Isla Pence, MD  promethazine (PHENERGAN) 25 MG suppository Place 1 suppository (25 mg total) rectally every 6 (six) hours as needed for nausea or vomiting. 11/29/20   Charlann Lange, PA-C    Scheduled Meds:  busPIRone  5 mg Oral BID   cyanocobalamin  1,000 mcg Intramuscular Weekly   enoxaparin (LOVENOX) injection  50 mg Subcutaneous Q12H   feeding supplement  237 mL Oral TID BM   folic acid  1 mg Oral Daily   hydrocortisone  25 mg Rectal BID   lidocaine  1 patch Transdermal Daily   magic mouthwash  w/lidocaine  5 mL Oral TID AC   metoprolol tartrate  25 mg Oral BID   mirtazapine  30 mg Oral QHS   multivitamin  15 mL Oral Daily   pantoprazole  40 mg Oral BID   polyethylene glycol  17 g Oral Daily   pregabalin  50 mg Oral TID   senna-docusate  1 tablet Oral BID   sodium chloride flush  3 mL Intravenous Q12H    Continuous Infusions:  sodium chloride 10 mL/hr at 01/07/21 1710   PRN Meds:.sodium chloride, acetaminophen **OR** acetaminophen, diphenhydrAMINE, menthol-cetylpyridinium, methocarbamol, ondansetron **OR** ondansetron (ZOFRAN) IV, oxyCODONE, phenol  Allergies as of 12/09/2020 - Review Complete 11/29/2020  Allergen Reaction Noted   Azithromycin Shortness Of Breath and Nausea And Vomiting 05/16/2018    Family History  Problem Relation Age of Onset   Hypertension Other     Social History   Socioeconomic History   Marital status: Single    Spouse name: Not on file   Number of children: Not on file   Years of education: Not on file   Highest education level: Not on file  Occupational History   Not on file  Tobacco Use   Smoking status: Never   Smokeless tobacco: Never  Substance and Sexual Activity   Alcohol use: Yes   Drug use: No   Sexual activity: Not on file  Other Topics Concern   Not on file  Social History Narrative   Not on file   Social Determinants of Health   Financial Resource Strain: Not on file  Food Insecurity: Not on file  Transportation Needs: Not on file  Physical Activity: Not on file  Stress: Not on file  Social Connections: Not on file  Intimate Partner Violence: Not on file    Review of Systems: Review of Systems  Constitutional:  Negative for chills and fever.  HENT:  Negative for congestion and sore throat.   Eyes:  Negative for pain and redness.  Respiratory:  Negative for cough and shortness of breath.   Cardiovascular:  Negative for chest pain and palpitations.  Gastrointestinal:  Positive for nausea and vomiting. Negative for abdominal pain, blood in stool, constipation, diarrhea, heartburn and melena.  Genitourinary:  Negative for flank pain and hematuria.  Musculoskeletal:  Negative for falls and joint pain.  Skin:  Negative for itching and rash.  Neurological:  Negative for seizures and loss of consciousness.   Psychiatric/Behavioral:  Negative for substance abuse. The patient is not nervous/anxious.     Physical Exam:Physical Exam Constitutional:      General: She is not in acute distress.    Appearance: She is obese. She is not ill-appearing.  HENT:     Head: Normocephalic and atraumatic.     Nose: Nose normal. No congestion.     Mouth/Throat:     Mouth: Mucous membranes are moist.     Pharynx: Oropharynx is clear.  Eyes:     General: No scleral icterus.    Extraocular Movements: Extraocular movements intact.     Conjunctiva/sclera: Conjunctivae normal.  Cardiovascular:     Rate and Rhythm: Regular rhythm. Tachycardia present.  Pulmonary:     Effort: Pulmonary effort is normal. No respiratory distress.  Abdominal:     General: Abdomen is flat. Bowel sounds are normal. There is no distension.     Palpations: Abdomen is soft. There is no mass.     Tenderness: There is no abdominal tenderness. There is no guarding or rebound.  Hernia: No hernia is present.  Musculoskeletal:        General: No swelling. Normal range of motion.     Cervical back: Normal range of motion and neck supple.  Skin:    General: Skin is warm and dry.  Neurological:     General: No focal deficit present.     Mental Status: She is alert and oriented to person, place, and time.  Psychiatric:        Mood and Affect: Mood normal.        Behavior: Behavior normal.        Thought Content: Thought content normal.        Judgment: Judgment normal.    Vital signs: Vitals:   01/08/21 2042 01/09/21 0410  BP: 122/70 127/72  Pulse: (!) 104 (!) 109  Resp: 20 18  Temp: 98.2 F (36.8 C) 99.1 F (37.3 C)  SpO2: 94% 95%   Last BM Date: 01/08/21    GI:  Lab Results: Recent Labs    01/08/21 0455  WBC 7.6  HGB 9.1*  HCT 30.8*  PLT 452*   BMET Recent Labs    01/07/21 0759 01/08/21 0455 01/09/21 0508  NA 133* 134* 132*  K 3.8 3.4* 3.8  CL 97* 97* 98  CO2 _0 GLUCOSE 105* 90 98  BUN 8 7 <5*   CREATININE 0.33* <0.30* <0.30*  CALCIUM 8.7* 8.8* 8.6*   LFT Recent Labs    01/09/21 0508  PROT 7.9  ALBUMIN 2.9*  AST 374*  ALT 122*  ALKPHOS 74  BILITOT 1.5*   PT/INR No results for input(s): LABPROT, INR in the last 72 hours.   Studies/Results: VAS [place] LOWER EXTREMITY VENOUS (DVT)  Result Date: 01/08/2021  Lower Venous DVT Study Patient Name:  JAYDALYN DEMATTIA  Date of Exam:   01/08/2021 Medical Rec #: [id number]          Accession #:    [id number] Date of Birth: 1990-06-14         Patient Gender: F Patient Age:   67 years Exam Location:  [facility] Procedure:      VAS [place] LOWER EXTREMITY VENOUS (DVT) Referring Phys: RALPH NETTEY --------------------------------------------------------------------------------  Indications: Edema.  Risk Factors: None identified. Limitations: Body habitus and poor ultrasound/tissue interface. Performing Technologist: Oliver Hum RVT  Examination Guidelines: A complete evaluation includes B-mode imaging, spectral Doppler, color Doppler, and power Doppler as needed of all accessible portions of each vessel. Bilateral testing is considered an integral part of a complete examination. Limited examinations for reoccurring indications may be performed as noted. The reflux portion of the exam is performed with the patient in reverse Trendelenburg.  +---------+---------------+---------+-----------+----------+-------------------+ RIGHT    CompressibilityPhasicitySpontaneityPropertiesThrombus Aging      +---------+---------------+---------+-----------+----------+-------------------+ CFV      Full           Yes      Yes                                      +---------+---------------+---------+-----------+----------+-------------------+ SFJ      Full                                                             +---------+---------------+---------+-----------+----------+-------------------+  FV Prox  Full                                                              +---------+---------------+---------+-----------+----------+-------------------+ FV Mid                  Yes      Yes                                      +---------+---------------+---------+-----------+----------+-------------------+ FV Distal               Yes      Yes                                      +---------+---------------+---------+-----------+----------+-------------------+ PFV      Full                                                             +---------+---------------+---------+-----------+----------+-------------------+ POP      Full           Yes      Yes                                      +---------+---------------+---------+-----------+----------+-------------------+ PTV      Full                                                             +---------+---------------+---------+-----------+----------+-------------------+ PERO                                                  Not well visualized +---------+---------------+---------+-----------+----------+-------------------+   +---------+---------------+---------+-----------+----------+-------------------+ LEFT     CompressibilityPhasicitySpontaneityPropertiesThrombus Aging      +---------+---------------+---------+-----------+----------+-------------------+ CFV      Full           Yes      Yes                                      +---------+---------------+---------+-----------+----------+-------------------+ SFJ      Full                                                             +---------+---------------+---------+-----------+----------+-------------------+ FV Prox  Full                                                             +---------+---------------+---------+-----------+----------+-------------------+  FV Mid                  Yes      Yes                                       +---------+---------------+---------+-----------+----------+-------------------+ FV Distal               Yes      Yes                                      +---------+---------------+---------+-----------+----------+-------------------+ PFV      Full                                                             +---------+---------------+---------+-----------+----------+-------------------+ POP      Full           Yes      Yes                                      +---------+---------------+---------+-----------+----------+-------------------+ PTV                                                   Not well visualized +---------+---------------+---------+-----------+----------+-------------------+ PERO                                                  Not well visualized +---------+---------------+---------+-----------+----------+-------------------+     Summary: RIGHT: - There is no evidence of deep vein thrombosis in the lower extremity. However, portions of this examination were limited- see technologist comments above.  - No cystic structure found in the popliteal fossa.  LEFT: - There is no evidence of deep vein thrombosis in the lower extremity. However, portions of this examination were limited- see technologist comments above.  - No cystic structure found in the popliteal fossa.  *See table(s) above for measurements and observations. Electronically signed by Monica Martinez MD on 01/08/2021 at 10:05:43 AM.    Final     Impression: Intractable vomiting with nausea - EGD 8/27: gastric ulcer, negative for H. Pylori. Gastritis. - RUQ [place]: cholelithiasis.  Rectal inflammation, Abnormal CT - CT ab/pelvis with contrast 10/14: rectal wall thickening extending into the rectosigmoid junction with may reflect infectious or inflammatory proctitis. - CRP 5.7, ESR 90  Elevated LFTs - AST 374/ALT 122/ Alk Phos 74 (AST trending down) -T. Bili 1.5 - [place] RUQ 10/11: moderate to severe  fatty liver, cholelithiasis without acute cholecystitis  Vitamin B and Folate deficiency (macrocytic anemia) - HGB 9.1 (improved from 8.7 yesterday)  Plan: With patient's intractable nausea and vomiting, she will likely not be able to tolerate prep for a colonoscopy. As well as her inability to ambulate effectively, a colon prep will be difficult for her. Will  plan on flexible sigmoidoscopy to evaluate rectal wall inflammation in addition to EGD to check healing of gastric ulcer.  Plan for EGD/Flex Sigmoidoscopy tomorrow. I thoroughly discussed the procedures to include nature, alternatives, benefits, and risks including but not limited to bleeding, perforation, infection, anesthesia/cardiac and pulmonary complications. Patient provides understanding and gave verbal consent to proceed.   Continue Protonix 40 mg IV BID.   Will do enema at 7am and then again at 9-10am in addition to [place].  After procedure tomorrow, patient could possibly benefit from consult to surgery for cholelithiasis as the etiology of intractable nausea and vomiting, whether it be inpatient or outpatient consultation.   [facility] will follow.     LOS: 30 days   Reisa Coppola Radford Pax  PA-C 01/09/2021, 8:08 AM  Contact #  [phone]

## 2021-01-09 NOTE — Progress Notes (Signed)
PROGRESS NOTE    Carla Little  KGM:[id number] DOB: 15-Aug-1990 DOA: 12/09/2020 PCP: Patient, No Pcp Per (Inactive)   Brief Narrative: Carla Little is a 30 y.o. female with a history of morbid obesity, recent left knee dislocation. Patient presented secondary to weakness with presentation concerning for Guillain Barre syndrome. Neurology consulted and patient started on IVIG. She was also found to have vitamin B12 and folate deficiencies. Patient is now awaiting rehab options for discharge.   Assessment & Plan:   Principal Problem:   Intractable vomiting with nausea Active Problems:   Left knee dislocation   PUD (peptic ulcer disease)   Nonalcoholic steatohepatitis (NASH)   Class 3 obesity (HCC)   Sinus tachycardia   Hypokalemia   GERD (gastroesophageal reflux disease)   Iron deficiency anemia due to chronic blood loss   E. coli UTI   Guillain Barr syndrome (HCC)   Guillain Barre syndrome Neuropathy Neurology consulted and recommended IVIG, for which patient completed 5 day course. Also recommended treating confounding diagnoses of vitamin B12 and folate deficiency.  -Continue Lyrica 50 mg TID -Neurology (signed off 10/2): Vitamin B12 qweek for 4 weeks, then monthly Vitamin B12 injections.  Intractable nausea and vomiting Initially resolved, recurrent. Associated generalized abdominal pain. In setting of known gastritis. Improved again at this time. GI consulted -GI recommendations: plan for EGD  Rectal inflammation Seen on CT imaging. Rectal thickening extending into rectosigmoid junction. Patient does mention pain with having bowel movements/wiping but otherwise did not have symptoms. She reports history of no anal related sexual activity. No systemic symptoms concerning for infection. -GI consulted. Anusol suppositories, flexible sigmoidoscopy  Depression/anxiety Evaluated by psychiatry -Continue Buspar 5 mg BID -Continue Remeron 30 mg qHS due to lack of  response; titrate up q2 weeks as needed/tolerated  Shortness of breath Transient. Resolved.  Left knee dislocation Patient previously evaluated by orthopedic surgery with recommendation for hinge brace and to allow for joint scarring. Currently weight bearing as tolerated with limited flexion of left knee.  Sinus tachycardia This has been managed with metoprolol. Unsure of etiology. Possibly related to deconditioning. No concerning symptoms.  Iron deficiency anemia Related to heavy menstruation. Hemoglobin stable.  Elevated AST/ALT NAFLD Hyperbilirubinemia AST/ALT with initial trend upwards. No associated symptoms. Abdominal ultrasound (10/11) significant for fatty liver disease and cholelithiasis without cholecystitis. INR normal. Bilirubin stable and AST/ALT now stable. GI consulted. -CMP daily  B12 deficiency Folate deficiency B12 and folate of 153 and 3.2 respectively. Patient started on Vitamin B12 and folic acid supplementation  E. Coli UTI Completed treatment with Ceftriaxone and transitioned to Cefazolin.  Overnight hypoxia Likely patient has OSA and/or OHS judging by symptoms and body habitus. Currently managed with oxygen overnight while asleep. CT abdomen/pelvis significant for evidence of hypoventilation. Attempted to trial CPAP but patient declined treatment.  Hypocalcemia Mild and likely related to hypoalbuminemia.  Hypokalemia -Potassium supplementation as needed  LE edema Possibly related to hypoalbuminemia. No evidence of kidney or heart impairment. Liver does have some disease; normal bilirubin. No ascites on ultrasound. Could trial Lasix, however patient already has MASD in perineal area which may worsen. Plan would be to avoid foley placement.  Bilateral leg pain Low suspicion for acute DVT, however pain is apparently new onset. Difficult to assess from examination secondary to body habitus. Venous duplex was negative for acute DVT, bilaterally.  Possible  oral candidiasis Treated with Diflucan IV. Seems to be resolved.  Positive blood culture result 1/4 samples significant for staphylococcus epidermidis. Unlikely  active infection.  Morbid obesity Body mass index is 76.06 kg/m. Dietitian consulted. -Dietitian recommendations (10/12): Liberalize diet to regular to promote oral intake Will request food be chopped into bite sized pieces and dining services alert nursing staff when tray is delivered Nursing staff to assist with set-up and feeeding of meals Increase Ensure Enlive po to TID, each supplement provides 350 kcal and 20 grams of protein Continue MVI regimen   DVT prophylaxis: Lovenox Code Status:   Code Status: Full Code Family Communication: None at bedside Disposition Plan: Discharge to SNF when bed is available if remains stable.   Consultants:  Neurology Psychiatry Gastroenterology  Procedures:  None  Antimicrobials: Ceftriaxone Ancef Diflucan Flagyl    Subjective: No issues overnight. Hoping to have procedures deferred because its her [birth year] tomorrow. She states that her leg numbness is slightly worse.  Objective: Vitals:   01/08/21 2042 01/09/21 0410 01/09/21 1109 01/09/21 1214  BP: 122/70 127/72  (!) 145/96  Pulse: (!) 104 (!) 109 (!) 111 95  Resp: 20 18  20   Temp: 98.2 F (36.8 C) 99.1 F (37.3 C)  97.9 F (36.6 C)  TempSrc: Oral Oral  Oral  SpO2: 94% 95% 98% 97%  Weight:  (!) 201 kg    Height:        Intake/Output Summary (Last 24 hours) at 01/09/2021 1237 Last data filed at 01/09/2021 1041 Gross per 24 hour  Intake 600 ml  Output --  Net 600 ml    Filed Weights   01/06/21 0427 01/07/21 0500 01/09/21 0410  Weight: (!) 196.8 kg (!) 200.6 kg (!) 201 kg    Examination:  General exam: Appears calm and comfortable Respiratory system: Clear to auscultation. Respiratory effort normal. Cardiovascular system: S1 & S2 heard, RRR. No murmurs, rubs, gallops or clicks. Gastrointestinal  system: Abdomen is obese but seems nondistended, soft and nontender. No organomegaly or masses felt. Normal bowel sounds heard. Central nervous system: Alert and oriented. No focal neurological deficits. Musculoskeletal: BLE. No calf tenderness Skin: No cyanosis. No rashes Psychiatry: Judgement and insight appear normal. Mood & affect appropriate.    Data Reviewed: I have personally reviewed following labs and imaging studies  CBC Lab Results  Component Value Date   WBC 7.6 01/08/2021   RBC 3.09 (L) 01/08/2021   HGB 9.1 (L) 01/08/2021   HCT 30.8 (L) 01/08/2021   MCV 99.7 01/08/2021   MCH 29.4 01/08/2021   PLT 452 (H) 01/08/2021   MCHC 29.5 (L) 01/08/2021   RDW 20.9 (H) 01/08/2021   LYMPHSABS 2.0 01/03/2021   MONOABS 0.9 01/03/2021   EOSABS 0.0 01/03/2021   BASOSABS 0.0 01/03/2021     Last metabolic panel Lab Results  Component Value Date   NA 132 (L) 01/09/2021   K 3.8 01/09/2021   CL 98 01/09/2021   CO2 28 01/09/2021   BUN <5 (L) 01/09/2021   CREATININE <0.30 (L) 01/09/2021   GLUCOSE 98 01/09/2021   GFRNONAA NOT CALCULATED 01/09/2021   GFRAA >60 05/16/2018   CALCIUM 8.6 (L) 01/09/2021   PHOS 2.9 12/14/2020   PROT 7.9 01/09/2021   ALBUMIN 2.9 (L) 01/09/2021   BILITOT 1.5 (H) 01/09/2021   ALKPHOS 74 01/09/2021   AST 374 (H) 01/09/2021   ALT 122 (H) 01/09/2021   ANIONGAP 6 01/09/2021    CBG (last 3)  No results for input(s): GLUCAP in the last 72 hours.   GFR: CrCl cannot be calculated (This lab value cannot be used to calculate CrCl because it  is not a number: <0.30).  Coagulation Profile: Recent Labs  Lab 01/04/21 1428  INR 1.0     Recent Results (from the past 240 hour(s))  Culture, blood (routine x 2)     Status: Abnormal   Collection Time: 01/02/21  9:08 AM   Specimen: BLOOD LEFT HAND  Result Value Ref Range Status   Specimen Description   Final    BLOOD LEFT HAND Performed at [facility], [address], [place],  [place] [address]    Special Requests   Final    BOTTLES DRAWN AEROBIC ONLY Blood Culture adequate volume Performed at [facility], [address], [place], [place] [address]    Culture  Setup Time   Final    GRAM POSITIVE COCCI IN CLUSTERS AEROBIC BOTTLE ONLY CRITICAL RESULT CALLED TO, READ BACK BY AND VERIFIED WITH: PHARMD DREW W. [id number] FCP    Culture (A)  Final    STAPHYLOCOCCUS EPIDERMIDIS THE SIGNIFICANCE OF ISOLATING THIS ORGANISM FROM A SINGLE SET OF BLOOD CULTURES WHEN MULTIPLE SETS ARE DRAWN IS UNCERTAIN. PLEASE NOTIFY THE MICROBIOLOGY DEPARTMENT WITHIN ONE WEEK IF SPECIATION AND SENSITIVITIES ARE REQUIRED. Performed at [facility], [address] [place], [place] [address]    Report Status 01/05/2021 FINAL  Final  Culture, blood (routine x 2)     Status: None   Collection Time: 01/02/21  9:08 AM   Specimen: BLOOD RIGHT HAND  Result Value Ref Range Status   Specimen Description   Final    BLOOD RIGHT HAND Performed at [facility], [address], [place], [place] [address]    Special Requests   Final    BOTTLES DRAWN AEROBIC AND ANAEROBIC Blood Culture adequate volume Performed at [facility], [address] [place], [place] [address]    Culture   Final    NO GROWTH 5 DAYS Performed at [facility], [address], [place], [place] [address]    Report Status 01/07/2021 FINAL  Final  Blood Culture ID Panel (Reflexed)     Status: Abnormal   Collection Time: 01/02/21  9:08 AM  Result Value Ref Range Status   Enterococcus faecalis NOT DETECTED NOT DETECTED Final   Enterococcus Faecium NOT DETECTED NOT DETECTED Final   Listeria monocytogenes NOT DETECTED NOT DETECTED Final   Staphylococcus species DETECTED (A) NOT DETECTED Final    Comment: CRITICAL RESULT CALLED TO, READ BACK BY AND VERIFIED WITH: PHARMD DREW W. [phone] FCP    Staphylococcus aureus (BCID) NOT DETECTED NOT DETECTED Final    Staphylococcus epidermidis DETECTED (A) NOT DETECTED Final    Comment: Methicillin (oxacillin) resistant coagulase negative staphylococcus. Possible blood culture contaminant (unless isolated from more than one blood culture draw or clinical case suggests pathogenicity). No antibiotic treatment is indicated for blood  culture contaminants. CRITICAL RESULT CALLED TO, READ BACK BY AND VERIFIED WITH: PHARMD DREW W. [phone] FCP    Staphylococcus lugdunensis NOT DETECTED NOT DETECTED Final   Streptococcus species NOT DETECTED NOT DETECTED Final   Streptococcus agalactiae NOT DETECTED NOT DETECTED Final   Streptococcus pneumoniae NOT DETECTED NOT DETECTED Final   Streptococcus pyogenes NOT DETECTED NOT DETECTED Final   A.calcoaceticus-baumannii NOT DETECTED NOT DETECTED Final   Bacteroides fragilis NOT DETECTED NOT DETECTED Final   Enterobacterales NOT DETECTED NOT DETECTED Final   Enterobacter cloacae complex NOT DETECTED NOT DETECTED Final   Escherichia coli NOT DETECTED NOT DETECTED Final  Klebsiella aerogenes NOT DETECTED NOT DETECTED Final   Klebsiella oxytoca NOT DETECTED NOT DETECTED Final   Klebsiella pneumoniae NOT DETECTED NOT DETECTED Final   Proteus species NOT DETECTED NOT DETECTED Final   Salmonella species NOT DETECTED NOT DETECTED Final   Serratia marcescens NOT DETECTED NOT DETECTED Final   Haemophilus influenzae NOT DETECTED NOT DETECTED Final   Neisseria meningitidis NOT DETECTED NOT DETECTED Final   Pseudomonas aeruginosa NOT DETECTED NOT DETECTED Final   Stenotrophomonas maltophilia NOT DETECTED NOT DETECTED Final   Candida albicans NOT DETECTED NOT DETECTED Final   Candida auris NOT DETECTED NOT DETECTED Final   Candida glabrata NOT DETECTED NOT DETECTED Final   Candida krusei NOT DETECTED NOT DETECTED Final   Candida parapsilosis NOT DETECTED NOT DETECTED Final   Candida tropicalis NOT DETECTED NOT DETECTED Final   Cryptococcus neoformans/gattii NOT DETECTED NOT  DETECTED Final   Methicillin resistance mecA/C DETECTED (A) NOT DETECTED Final    Comment: CRITICAL RESULT CALLED TO, READ BACK BY AND VERIFIED WITH: PHARMD DREW W. [phone] FCP Performed at [facility], [address], [place], [place] [address]   Resp Panel by RT-PCR (Flu A&B, Covid) Nasopharyngeal Swab     Status: None   Collection Time: 01/06/21 10:09 AM   Specimen: Nasopharyngeal Swab; Nasopharyngeal(NP) swabs in vial transport medium  Result Value Ref Range Status   SARS Coronavirus 2 by RT PCR NEGATIVE NEGATIVE Final    Comment: (NOTE) SARS-CoV-2 target nucleic acids are NOT DETECTED.  The SARS-CoV-2 RNA is generally detectable in upper respiratory specimens during the acute phase of infection. The lowest concentration of SARS-CoV-2 viral copies this assay can detect is 138 copies/mL. A negative result does not preclude SARS-Cov-2 infection and should not be used as the sole basis for treatment or other patient management decisions. A negative result may occur with  improper specimen collection/handling, submission of specimen other than nasopharyngeal swab, presence of viral mutation(s) within the areas targeted by this assay, and inadequate number of viral copies(<138 copies/mL). A negative result must be combined with clinical observations, patient history, and epidemiological information. The expected result is Negative.  Fact Sheet for Patients:  BloggerCourse.com  Fact Sheet for Healthcare Providers:  SeriousBroker.it  This test is no t yet approved or cleared by the [place] FDA and  has been authorized for detection and/or diagnosis of SARS-CoV-2 by FDA under an Emergency Use Authorization (EUA). This EUA will remain  in effect (meaning this test can be used) for the duration of the COVID-19 declaration under Section 564(b)(1) of the Act, 21 U.S.C.section 360bbb-3(b)(1), unless the authorization is  terminated  or revoked sooner.       Influenza A by PCR NEGATIVE NEGATIVE Final   Influenza B by PCR NEGATIVE NEGATIVE Final    Comment: (NOTE) The Xpert Xpress SARS-CoV-2/FLU/RSV plus assay is intended as an aid in the diagnosis of influenza from Nasopharyngeal swab specimens and should not be used as a sole basis for treatment. Nasal washings and aspirates are unacceptable for Xpert Xpress SARS-CoV-2/FLU/RSV testing.  Fact Sheet for Patients: BloggerCourse.com  Fact Sheet for Healthcare Providers: SeriousBroker.it  This test is not yet approved or cleared by the [place] FDA and has been authorized for detection and/or diagnosis of SARS-CoV-2 by FDA under an Emergency Use Authorization (EUA). This EUA will remain in effect (meaning this test can be used) for the duration of the COVID-19 declaration under Section 564(b)(1) of the Act, 21 U.S.C. section 360bbb-3(b)(1), unless  the authorization is terminated or revoked.  Performed at [facility], [address], [place], [place] [address]         Radiology Studies: VAS [place] LOWER EXTREMITY VENOUS (DVT)  Result Date: 01/08/2021  Lower Venous DVT Study Patient Name:  ROMAYNE TICAS  Date of Exam:   01/08/2021 Medical Rec #: [id number]          Accession #:    [id number] Date of Birth: 08-Sep-1990         Patient Gender: F Patient Age:   53 years Exam Location:  [facility] Procedure:      VAS [place] LOWER EXTREMITY VENOUS (DVT) Referring Phys: Zeus Marquis --------------------------------------------------------------------------------  Indications: Edema.  Risk Factors: None identified. Limitations: Body habitus and poor ultrasound/tissue interface. Performing Technologist: Chanda Busing RVT  Examination Guidelines: A complete evaluation includes B-mode imaging, spectral Doppler, color Doppler, and power Doppler as needed of all accessible portions  of each vessel. Bilateral testing is considered an integral part of a complete examination. Limited examinations for reoccurring indications may be performed as noted. The reflux portion of the exam is performed with the patient in reverse Trendelenburg.  +---------+---------------+---------+-----------+----------+-------------------+ RIGHT    CompressibilityPhasicitySpontaneityPropertiesThrombus Aging      +---------+---------------+---------+-----------+----------+-------------------+ CFV      Full           Yes      Yes                                      +---------+---------------+---------+-----------+----------+-------------------+ SFJ      Full                                                             +---------+---------------+---------+-----------+----------+-------------------+ FV Prox  Full                                                             +---------+---------------+---------+-----------+----------+-------------------+ FV Mid                  Yes      Yes                                      +---------+---------------+---------+-----------+----------+-------------------+ FV Distal               Yes      Yes                                      +---------+---------------+---------+-----------+----------+-------------------+ PFV      Full                                                             +---------+---------------+---------+-----------+----------+-------------------+  POP      Full           Yes      Yes                                      +---------+---------------+---------+-----------+----------+-------------------+ PTV      Full                                                             +---------+---------------+---------+-----------+----------+-------------------+ PERO                                                  Not well visualized  +---------+---------------+---------+-----------+----------+-------------------+   +---------+---------------+---------+-----------+----------+-------------------+ LEFT     CompressibilityPhasicitySpontaneityPropertiesThrombus Aging      +---------+---------------+---------+-----------+----------+-------------------+ CFV      Full           Yes      Yes                                      +---------+---------------+---------+-----------+----------+-------------------+ SFJ      Full                                                             +---------+---------------+---------+-----------+----------+-------------------+ FV Prox  Full                                                             +---------+---------------+---------+-----------+----------+-------------------+ FV Mid                  Yes      Yes                                      +---------+---------------+---------+-----------+----------+-------------------+ FV Distal               Yes      Yes                                      +---------+---------------+---------+-----------+----------+-------------------+ PFV      Full                                                             +---------+---------------+---------+-----------+----------+-------------------+ POP      Full  Yes      Yes                                      +---------+---------------+---------+-----------+----------+-------------------+ PTV                                                   Not well visualized +---------+---------------+---------+-----------+----------+-------------------+ PERO                                                  Not well visualized +---------+---------------+---------+-----------+----------+-------------------+     Summary: RIGHT: - There is no evidence of deep vein thrombosis in the lower extremity. However, portions of this examination were limited- see technologist  comments above.  - No cystic structure found in the popliteal fossa.  LEFT: - There is no evidence of deep vein thrombosis in the lower extremity. However, portions of this examination were limited- see technologist comments above.  - No cystic structure found in the popliteal fossa.  *See table(s) above for measurements and observations. Electronically signed by Sherald Hess MD on 01/08/2021 at 10:05:43 AM.    Final         Scheduled Meds:  busPIRone  5 mg Oral BID   cyanocobalamin  1,000 mcg Intramuscular Weekly   enoxaparin (LOVENOX) injection  50 mg Subcutaneous Q12H   feeding supplement  237 mL Oral TID BM   folic acid  1 mg Oral Daily   hydrocortisone  25 mg Rectal BID   lidocaine  1 patch Transdermal Daily   magic mouthwash w/lidocaine  5 mL Oral TID AC   metoprolol tartrate  25 mg Oral BID   mirtazapine  30 mg Oral QHS   multivitamin  15 mL Oral Daily   pantoprazole  40 mg Oral BID   polyethylene glycol  17 g Oral Daily   [START ON 01/10/2021] polyethylene glycol  17 g Oral Once   pregabalin  50 mg Oral TID   senna-docusate  1 tablet Oral BID   sodium chloride flush  3 mL Intravenous Q12H   [START ON 01/10/2021] sodium phosphate  1 enema Rectal Once   [START ON 01/10/2021] sodium phosphate  1 enema Rectal Once   Continuous Infusions:  sodium chloride 10 mL/hr at 01/07/21 1710     LOS: 30 days     Jacquelin Hawking, MD [facility] 01/09/2021, 12:37 PM  If 7PM-7AM, please contact night-coverage [email or web address]

## 2021-01-09 NOTE — Progress Notes (Signed)
NIF -40  FVC 1.4L

## 2021-01-10 ENCOUNTER — Inpatient Hospital Stay (HOSPITAL_COMMUNITY): Payer: Medicaid Other | Admitting: Certified Registered"

## 2021-01-10 ENCOUNTER — Encounter (HOSPITAL_COMMUNITY)
Admission: EM | Disposition: A | Discharge status: Skilled Nursing Facility | Payer: Self-pay | Source: Home / Self Care | Attending: Family Medicine

## 2021-01-10 ENCOUNTER — Encounter (HOSPITAL_COMMUNITY): Payer: Self-pay | Admitting: Internal Medicine

## 2021-01-10 HISTORY — PX: FLEXIBLE SIGMOIDOSCOPY: SHX5431

## 2021-01-10 HISTORY — PX: ESOPHAGOGASTRODUODENOSCOPY: SHX5428

## 2021-01-10 LAB — COMPREHENSIVE METABOLIC PANEL
ALT: 128 U/L — ABNORMAL HIGH (ref 0–44)
AST: 377 U/L — ABNORMAL HIGH (ref 15–41)
Albumin: 2.9 g/dL — ABNORMAL LOW (ref 3.5–5.0)
Alkaline Phosphatase: 77 U/L (ref 38–126)
Anion gap: 9 (ref 5–15)
BUN: 7 mg/dL (ref 6–20)
CO2: 28 mmol/L (ref 22–32)
Calcium: 8.9 mg/dL (ref 8.9–10.3)
Chloride: 98 mmol/L (ref 98–111)
Creatinine, Ser: 0.3 mg/dL — ABNORMAL LOW (ref 0.44–1.00)
GFR, Estimated: >60 mL/min (ref >60)
Glucose, Bld: 102 mg/dL — ABNORMAL HIGH (ref 70–99)
Potassium: 3.3 mmol/L — ABNORMAL LOW (ref 3.5–5.1)
Sodium: 135 mmol/L (ref 135–145)
Total Bilirubin: 1.6 mg/dL — ABNORMAL HIGH (ref 0.3–1.2)
Total Protein: 8.3 g/dL — ABNORMAL HIGH (ref 6.5–8.1)

## 2021-01-10 SURGERY — EGD (ESOPHAGOGASTRODUODENOSCOPY)
Anesthesia: Monitor Anesthesia Care

## 2021-01-10 SURGERY — SIGMOIDOSCOPY, FLEXIBLE
Anesthesia: Monitor Anesthesia Care

## 2021-01-10 MED ORDER — PREGABALIN 75 MG PO CAPS
75.0000 mg | ORAL_CAPSULE | Freq: Three times a day (TID) | ORAL | Status: DC
  Administered 2021-01-10 – 2021-01-14 (×14): 75 mg via ORAL
  Filled 2021-01-10 (×14): qty 1

## 2021-01-10 MED ORDER — POTASSIUM CHLORIDE CRYS ER 20 MEQ PO TBCR
40.0000 meq | EXTENDED_RELEASE_TABLET | Freq: Once | ORAL | Status: AC
  Administered 2021-01-10: 40 meq via ORAL
  Filled 2021-01-10: qty 2

## 2021-01-10 MED ORDER — LACTATED RINGERS IV SOLN
INTRAVENOUS | Status: DC | PRN
Start: 2021-01-10 — End: 2021-01-10

## 2021-01-10 MED ORDER — PROPOFOL 500 MG/50ML IV EMUL
INTRAVENOUS | Status: DC | PRN
  Administered 2021-01-10: 100 ug/kg/min via INTRAVENOUS

## 2021-01-10 MED ORDER — SODIUM CHLORIDE 0.9 % IV SOLN: INTRAVENOUS | Status: DC

## 2021-01-10 MED ORDER — PROPOFOL 10 MG/ML IV BOLUS
INTRAVENOUS | Status: AC
  Filled 2021-01-10: qty 20

## 2021-01-10 NOTE — Anesthesia Preprocedure Evaluation (Addendum)
Anesthesia Evaluation  Patient identified by MRN, date of birth, ID band Patient awake    Reviewed: Allergy & Precautions, H&P , NPO status , Patient's Chart, lab work & pertinent test results  Airway Mallampati: III  TM Distance: >3 FB Neck ROM: Full    Dental no notable dental hx. (+) Teeth Intact, Dental Advisory Given   Pulmonary neg pulmonary ROS,    Pulmonary exam normal breath sounds clear to auscultation       Cardiovascular negative cardio ROS   Rhythm:Regular Rate:Tachycardia     Neuro/Psych Depression negative neurological ROS     GI/Hepatic PUD, GERD  ,(+) Hepatitis -  Endo/Other  Morbid obesity  Renal/GU negative Renal ROS  negative genitourinary   Musculoskeletal   Abdominal   Peds  Hematology  (+) Blood dyscrasia, anemia ,   Anesthesia Other Findings   Reproductive/Obstetrics negative OB ROS                            Anesthesia Physical Anesthesia Plan  ASA: 3  Anesthesia Plan: MAC   Post-op Pain Management:    Induction: Intravenous  PONV Risk Score and Plan: 2 and Propofol infusion  Airway Management Planned: Simple Face Mask  Additional Equipment:   Intra-op Plan:   Post-operative Plan:   Informed Consent: I have reviewed the patients History and Physical, chart, labs and discussed the procedure including the risks, benefits and alternatives for the proposed anesthesia with the patient or authorized representative who has indicated his/her understanding and acceptance.     Dental advisory given  Plan Discussed with: CRNA  Anesthesia Plan Comments:         Anesthesia Quick Evaluation

## 2021-01-10 NOTE — Anesthesia Procedure Notes (Signed)
Procedure Name: MAC Date/Time: 01/10/2021 1:21 PM Performed by: Niel Hummer, CRNA Pre-anesthesia Checklist: Patient identified, Emergency Drugs available, Suction available and Patient being monitored Oxygen Delivery Method: Simple face mask

## 2021-01-10 NOTE — Progress Notes (Signed)
Occupational Therapy Treatment Patient Details Name: Carla Little MRN: [id number] DOB: 11/07/1990 Today's Date: 01/10/2021   History of present illness Patient is a 30 y.o. female who presented to [facility] for intractable N/V on 9/17. ED labwork reveals e. coli UTI. Pt had recent hospital admission from 8/8-9/5 due to Lt knee dislocation which was reduced with fractures of the proximal fibula ligamentous avulsion laterally as well as medially off the medial femoral condyle and MRI showed complete ACL & PCL tears and MCL strain. That hospital admission was complicated by nausea, vomiting, tachycardia, UTI, and fecal impaction;. PMH significant for morbid obesity, GERD, fatty liver disease, depression. Patient now with Guillian-Barre syndrome   OT comments  Patient is set for EGD later today. Patient was noted to have continued edema in BUE more in LUE than RUE.patient was positioned with pillows under BUE and educated nursing to maintain. Nurse and nursing tech verbalized and demonstrated understanding.  Patient continues to have decreased strength in RUE than LUE. Patient was able to demonstrate ability to use phone to send text message to friend during session with increased time. Patient completed hand grasp strengthening with blue stress ball. Patient's discharge plan remains appropriate at this time. OT will continue to follow acutely.     Recommendations for follow up therapy are one component of a multi-disciplinary discharge planning process, led by the attending physician.  Recommendations may be updated based on patient status, additional functional criteria and insurance authorization.    Follow Up Recommendations  SNF    Equipment Recommendations  None recommended by OT    Recommendations for Other Services      Precautions / Restrictions Precautions Precautions: Fall Precaution Comments: Back pain.  go slow with sitting in  somewhat chair position, can slide  down, be sure legs  and feet are secure to foot board with folded blankets Required Braces or Orthoses: Knee Immobilizer - Left Knee Immobilizer - Left: On at all times Other Brace: does not need KI at this time but may consider since opatient has more weakness due GBS Restrictions Weight Bearing Restrictions: No LLE Weight Bearing: Weight bearing as tolerated Other Position/Activity Restrictions: PT Orders from Maureen Ralphs, MD; "WBAT Left LE, only up with a walker, does not need brace at this standpoint"       Mobility Bed Mobility Overal bed mobility: Needs Assistance             General bed mobility comments: patient was X2 to adjust trunk in bed with patient noted to have lateral lean to L side.    Transfers                      Balance                                           ADL either performed or assessed with clinical judgement   ADL Overall ADL's : Needs assistance/impaired Eating/Feeding: Bed level;Minimal assistance Eating/Feeding Details (indicate cue type and reason): patient was able to take small sips with min A from miralax cup per nursing need with LUE.                                   General ADL Comments: nursing was educated on keeping pillows under BUE to reduce edema  in hands to maitnain patients ability to use hands for choice tasks and ADLs. nurse and NT verbalzied and demonstrated understanding.     Vision Patient Visual Report: No change from baseline     Perception     Praxis      Cognition Arousal/Alertness: Awake/alert Behavior During Therapy: Flat affect Overall Cognitive Status: Within Functional Limits for tasks assessed                                 General Comments: patient was flat today not very talkative        Exercises Other Exercises Other Exercises: patient participated in UE ROM to increase use for ADLs and choice tasks. patient was provided with larger stress ball with hand  hels assist to attempt to complete whole hand grasping tasks.   Shoulder Instructions       General Comments      Pertinent Vitals/ Pain       Faces Pain Scale: Hurts a little bit Pain Location: back Pain Descriptors / Indicators: Discomfort Pain Intervention(s): Limited activity within patient's tolerance;Monitored during session;Repositioned  Home Living                                          Prior Functioning/Environment              Frequency  Min 2X/week        Progress Toward Goals  OT Goals(current goals can now be found in the care plan section)     Acute Rehab OT Goals Patient Stated Goal: to walk  Plan      Co-evaluation                 AM-PAC OT "6 Clicks" Daily Activity     Outcome Measure   Help from another person eating meals?: A Little Help from another person taking care of personal grooming?: A Lot Help from another person toileting, which includes using toliet, bedpan, or urinal?: Total Help from another person bathing (including washing, rinsing, drying)?: A Lot Help from another person to put on and taking off regular upper body clothing?: Total Help from another person to put on and taking off regular lower body clothing?: Total 6 Click Score: 10    End of Session    OT Visit Diagnosis: Other abnormalities of gait and mobility (R26.89);History of falling (Z91.81);Pain;Muscle weakness (generalized) (M62.81)   Activity Tolerance Patient tolerated treatment well   Patient Left in bed;with call bell/phone within reach;with bed alarm set   Nurse Communication Other (comment) (nurse cleared patient to participate in session)        Time: 7619-5093 OT Time Calculation (min): 27 min  Charges: OT General Charges $OT Visit: 1 Visit OT Treatments $Therapeutic Activity: 23-37 mins  Sharyn Blitz OTR/L, MS Acute Rehabilitation Department Office# [phone] Pager# [phone]   Chalmers Guest  Tiler Brandis 01/10/2021, 9:15 AM

## 2021-01-10 NOTE — Progress Notes (Signed)
[facility] Progress Note  Carla Little 30 y.o. 1990-03-28  CC:   Intractable vomiting with nausea, abnormal CT scan   Subjective: Today is patient's birthday. She states she had an enema this morning and since she already had that done she would like to go ahead and proceed with the procedure today instead of waiting until tomorrow. Denies nausea, vomiting. Endorses abdominal pain since receiving enema.  ROS : Review of Systems  Cardiovascular:  Negative for chest pain and palpitations.  Gastrointestinal:  Positive for abdominal pain. Negative for blood in stool, constipation, diarrhea, heartburn, melena, nausea and vomiting.     Objective: Vital signs in last 24 hours: Vitals:   01/09/21 2035 01/10/21 0339  BP: 116/67 (!) 143/70  Pulse: 100 (!) 105  Resp: 16 14  Temp: 98.4 F (36.9 C) 98.2 F (36.8 C)  SpO2: 100% 97%    Physical Exam:  General:  Alert, cooperative, no distress, appears stated age  Head:  Normocephalic, without obvious abnormality, atraumatic  Eyes:  Anicteric sclera, EOM's intact  Lungs:   Clear to auscultation bilaterally, respirations unlabored  Heart:  Regular rate and rhythm, S1, S2 normal  Abdomen:   Soft, non-tender, bowel sounds active all four quadrants,  no masses,     Lab Results: Recent Labs    01/09/21 0508 01/10/21 0439  NA 132* 135  K 3.8 3.3*  CL 98 98  CO2 28 28  GLUCOSE 98 102*  BUN <5* 7  CREATININE <0.30* 0.30*  CALCIUM 8.6* 8.9   Recent Labs    01/09/21 0508 01/10/21 0439  AST 374* 377*  ALT 122* 128*  ALKPHOS 74 77  BILITOT 1.5* 1.6*  PROT 7.9 8.3*  ALBUMIN 2.9* 2.9*   Recent Labs    01/08/21 0455  WBC 7.6  HGB 9.1*  HCT 30.8*  MCV 99.7  PLT 452*   No results for input(s): LABPROT, INR in the last 72 hours.    Assessment Intractable vomiting with nausea - EGD 8/27: gastric ulcer, negative for H. Pylori. Gastritis. - RUQ [place]: cholelithiasis.   Rectal inflammation, Abnormal CT - CT  ab/pelvis with contrast 10/14: rectal wall thickening extending into the rectosigmoid junction with may reflect infectious or inflammatory proctitis. - CRP 5.7, ESR 90   Elevated LFTs - AST 374/ALT 122/ Alk Phos 74 (AST trending down) -T. Bili 1.5 - [place] RUQ 10/11: moderate to severe fatty liver, cholelithiasis without acute cholecystitis   Vitamin B and Folate deficiency (macrocytic anemia) - HGB 9.1 (improved from 8.7 yesterday)  Plan: Plan for EGD/Flex sig today. I thoroughly discussed the procedures to include nature, alternatives, benefits, and risks including but not limited to bleeding, perforation, infection, anesthesia/cardiac and pulmonary complications. Patient provides understanding and gave verbal consent to proceed.   Will obtain urine drug screen to r/o cannaboid hyperemesis syndrome.  Continue Protonix 40 mg IV BID.  If procedure is negative, recommend surgical consult.   [facility] will follow.    Tikesha Mort Radford Pax PA-C 01/10/2021, 8:26 AM  Contact #  [phone]

## 2021-01-10 NOTE — Progress Notes (Signed)
Speech Language Pathology Treatment: Dysphagia  Patient Details Name: Carla Little MRN: [id number] DOB: October 11, 1990 Today's Date: 01/10/2021 Time: 3419-3790 SLP Time Calculation (min) (ACUTE ONLY): 25 min  Assessment / Plan / Recommendation Clinical Impression  Today is pt's birthday and skilled SLP follow up indicated for compensation strategy effectiveness and adequacy of po intake. Pt underwent endoscopy today due to recent n/v -no ulcer was noted and pt was placed back on a soft/thin diet.    Pt with birthday cake at her bedisde - of which she was willing to consume.  Pt consumed approximately 1/4 of her cake and 3 ounces of gingerale t/o the session.  She was feeding herself with her right hand but then admitted to being tired from her procedure today - therefore SLP fed pt via fork but had her consume liquids via straw independently.    Minimally prolonged rotary mastication noted but no significant oral retention nor indications of aspiration.    Pt reports poor appetite as largest source of lack of intake. She does advise that she has tried to eat food prior to drinking Ensure but states she will make more effort.  [place] did benefit from moderate cue as to compensation strategy to swish and swallow with water for full oral clearance of minimal particulates of cake. SLP encouraged pt regarding her progress with swallowing!   Pt has made excellent progress in her swallowing ability.    Will follow up briefly to assure adequate airway protection with po, readiness for dietary advancement and compensation strategy use helpful/indicated. Pt agreeable to plan.      HPI HPI: Patient is a 30 year old female with morbid obesity, nonalcoholic fatty liver disease, GERD, depression, recently was hospitalized following a mechanical fall with a left knee dislocation, resultant MCL strain, ACL and PCL rupture then complicated by nausea vomiting, tachycardia, UTI and fecal impaction during the  hospitalization. Progressive weakness during this hospitalization. 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      SLP Plan  Continue with current plan of care      Recommendations for follow up therapy are one component of a multi-disciplinary discharge planning process, led by the attending physician.  Recommendations may be updated based on patient status, additional functional criteria and insurance authorization.    Recommendations  Diet recommendations: Dysphagia 3 (mechanical soft);Thin liquid Liquids provided via: Straw;Cup Medication Administration: Other (Comment) (as tolerated) Compensations: Follow solids with liquid;Lingual sweep for clearance of pocketing;Other (Comment) Postural Changes and/or Swallow Maneuvers: Seated upright 90 degrees;Upright 30-60 min after meal                Oral Care Recommendations: Oral care BID SLP Visit Diagnosis: Dysphagia, oral phase (R13.11) Plan: Continue with current plan of care       GO              Carla Infante, MS [facility] SLP Acute Rehab Services Office [phone] Pager [phone]   Carla Little  01/10/2021, 8:03 PM

## 2021-01-10 NOTE — Op Note (Signed)
[facility] Patient Name: Carla Little Procedure Date: 01/10/2021 MRN: [id number] Attending MD: Kathi Der , MD Date of Birth: 12/21/1990 CSN: [id number] Age: 30 Admit Type: Inpatient Procedure:                Flexible Sigmoidoscopy Indications:              Abnormal CT of the GI tract Providers:                Kathi Der, MD, Fransisca Connors, Beryle Beams, Technician, Salley Scarlet, Technician Referring MD:              Medicines:                Sedation Administered by an Anesthesia Professional Complications:            No immediate complications. Estimated Blood Loss:     Estimated blood loss was minimal. Procedure:                Pre-Anesthesia Assessment:                           - Prior to the procedure, a History and Physical                            was performed, and patient medications and                            allergies were reviewed. The patient's tolerance of                            previous anesthesia was also reviewed. The risks                            and benefits of the procedure and the sedation                            options and risks were discussed with the patient.                            All questions were answered, and informed consent                            was obtained. Prior Anticoagulants: The patient has                            taken no previous anticoagulant or antiplatelet                            agents. ASA Grade Assessment: III - A patient with                            severe systemic disease. After reviewing the risks  and benefits, the patient was deemed in                            satisfactory condition to undergo the procedure.                           After obtaining informed consent, the scope was                            passed under direct vision. The GIF-H190 ([id number])                            Olympus endoscope was  introduced through the anus                            and advanced to the 20 cm from the anal verge. The                            flexible sigmoidoscopy was technically difficult                            and complex due to poor bowel prep with stool                            present. The quality of the bowel preparation was                            poor. Scope In: Scope Out: Findings:      The perianal and digital rectal examinations were normal.      Extensive amounts of solid stool was found in the rectum and in the       recto-sigmoid colon, interfering with visualization.      Internal hemorrhoids were found during retroflexion. The hemorrhoids       were small. Impression:               - Preparation of the colon was poor.                           - Stool in the rectum and in the recto-sigmoid                            colon.                           - Internal hemorrhoids.                           - No specimens collected. Moderate Sedation:      Moderate (conscious) sedation was personally administered by an       anesthesia professional. The following parameters were monitored: oxygen       saturation, heart rate, blood pressure, and response to care. Recommendation:           - Return patient to hospital ward for ongoing care.                           -  Soft diet.                           - Continue present medications. Procedure Code(s):        --- Professional ---                           [phone], Sigmoidoscopy, flexible; diagnostic,                            including collection of specimen(s) by brushing or                            washing, when performed (separate procedure) Diagnosis Code(s):        --- Professional ---                           K64.8, Other hemorrhoids                           R93.3, Abnormal findings on diagnostic imaging of                            other parts of digestive tract CPT copyright 2019 American Medical Association.  All rights reserved. The codes documented in this report are preliminary and upon coder review may  be revised to meet current compliance requirements. Kathi Der, MD Kathi Der, MD 01/10/2021 1:53:15 PM Number of Addenda: 0

## 2021-01-10 NOTE — Progress Notes (Signed)
Chaplain completing order for a Scientist, water quality to visit with [place] again.  [place] enjoys company and visits with others.  Chaplain will follow-up today.    01/10/21 1250  Companion Ecologist  Recommended  Hours needed 4 hours  Companion reason Emotional support  Merchant navy officer (For Healthcare)  Does Patient Have a Medical Advance Directive? No  Would patient like information on creating a medical advance directive? No - Patient declined

## 2021-01-10 NOTE — Op Note (Signed)
[facility] Patient Name: Carla Little Procedure Date: 01/10/2021 MRN: [id number] Attending MD: Kathi Der , MD Date of Birth: 1990/04/22 CSN: [id number] Age: 30 Admit Type: Inpatient Procedure:                Upper GI endoscopy Indications:              Follow-up of gastric ulcer, Follow-up of                            esophagitis, Nausea with vomiting Providers:                Kathi Der, MD, Fransisca Connors, Beryle Beams, Technician, Salley Scarlet, Technician,                            Yevonne Pax, CRNA Referring MD:              Medicines:                Sedation Administered by an Anesthesia Professional Complications:            No immediate complications. Estimated Blood Loss:     Estimated blood loss: none. Procedure:                Pre-Anesthesia Assessment:                           - Prior to the procedure, a History and Physical                            was performed, and patient medications and                            allergies were reviewed. The patient's tolerance of                            previous anesthesia was also reviewed. The risks                            and benefits of the procedure and the sedation                            options and risks were discussed with the patient.                            All questions were answered, and informed consent                            was obtained. Prior Anticoagulants: The patient has                            taken no previous anticoagulant or antiplatelet                            agents.  ASA Grade Assessment: III - A patient with                            severe systemic disease. After reviewing the risks                            and benefits, the patient was deemed in                            satisfactory condition to undergo the procedure.                           After obtaining informed consent, the endoscope was                             passed under direct vision. Throughout the                            procedure, the patient's blood pressure, pulse, and                            oxygen saturations were monitored continuously. The                            GIF-H190 ([id number]) Olympus endoscope was introduced                            through the mouth, and advanced to the second part                            of duodenum. The upper GI endoscopy was performed                            with moderate difficulty due to the patient's body                            habitus. The patient tolerated the procedure. Scope In: Scope Out: Findings:      The Z-line was regular and was found 39 cm from the incisors.      No gross lesions were noted in the entire examined stomach.      The duodenal bulb, first portion of the duodenum and second portion of       the duodenum were normal. Impression:               - Z-line regular, 39 cm from the incisors.                           - No gross lesions in the stomach.                           - Normal duodenal bulb, first portion of the                            duodenum and second portion  of the duodenum.                           - No specimens collected. Moderate Sedation:      Moderate (conscious) sedation was personally administered by an       anesthesia professional. The following parameters were monitored: oxygen       saturation, heart rate, blood pressure, and response to care. Recommendation:           - Perform a flexible sigmoidoscopy today. Procedure Code(s):        --- Professional ---                           [phone], Esophagogastroduodenoscopy, flexible,                            transoral; diagnostic, including collection of                            specimen(s) by brushing or washing, when performed                            (separate procedure) Diagnosis Code(s):        --- Professional ---                           K25.9, Gastric ulcer,  unspecified as acute or                            chronic, without hemorrhage or perforation                           K20.90, Esophagitis, unspecified without bleeding                           R11.2, Nausea with vomiting, unspecified CPT copyright 2019 American Medical Association. All rights reserved. The codes documented in this report are preliminary and upon coder review may  be revised to meet current compliance requirements. Kathi Der, MD Kathi Der, MD 01/10/2021 1:42:51 PM Number of Addenda: 0

## 2021-01-10 NOTE — Anesthesia Postprocedure Evaluation (Signed)
Anesthesia Post Note  Patient: Carla Little  Procedure(s) Performed: ESOPHAGOGASTRODUODENOSCOPY (EGD) FLEXIBLE SIGMOIDOSCOPY     Patient location during evaluation: Endoscopy Anesthesia Type: MAC Level of consciousness: awake and alert Pain management: pain level controlled Vital Signs Assessment: post-procedure vital signs reviewed and stable Respiratory status: spontaneous breathing, nonlabored ventilation, respiratory function stable and patient connected to nasal cannula oxygen Cardiovascular status: stable and blood pressure returned to baseline Postop Assessment: no apparent nausea or vomiting Anesthetic complications: no   No notable events documented.  Last Vitals:  Vitals:   01/10/21 1350 01/10/21 1400  BP: 127/82 (!) 136/93  Pulse: (!) 106 (!) 106  Resp: 15 (!) 25  Temp:    SpO2: 97% 98%    Last Pain:  Vitals:   01/10/21 1350  TempSrc:   PainSc: 0-No pain                 Daniele Dillow,W. EDMOND

## 2021-01-10 NOTE — Progress Notes (Signed)
   01/10/21 1427  Assess: MEWS Score  Temp 98.5 F (36.9 C)  BP (!) 129/51  Pulse Rate (!) 111  Resp 20  Level of Consciousness Alert  SpO2 100 %  O2 Device Nasal Cannula  O2 Flow Rate (L/min) 2 L/min  Assess: MEWS Score  MEWS Temp 0  MEWS Systolic 0  MEWS Pulse 2  MEWS RR 0  MEWS LOC 0  MEWS Score 2  MEWS Score Color Yellow  Assess: if the MEWS score is Yellow or Red  Were vital signs taken at a resting state? Yes  Focused Assessment No change from prior assessment  Does the patient meet 2 or more of the SIRS criteria? No  Does the patient have a confirmed or suspected source of infection? No  Provider and Rapid Response Notified? No  MEWS guidelines implemented *See Row Information* Yes  Treat  MEWS Interventions Administered scheduled meds/treatments  Pain Scale 0-10  Pain Score 0  Breathing 0  Negative Vocalization 0  Facial Expression 0  Body Language 0  Consolability 0  PAINAD Score 0  Take Vital Signs  Increase Vital Sign Frequency  Yellow: Q 2hr X 2 then Q 4hr X 2, if remains yellow, continue Q 4hrs  Escalate  MEWS: Escalate Yellow: discuss with charge nurse/RN and consider discussing with provider and RRT  Notify: Charge Nurse/RN  Name of Charge Nurse/RN Notified Britta Mccreedy, RN  Date Charge Nurse/RN Notified 01/10/21  Time Charge Nurse/RN Notified 1432  Notify: Provider  Provider Name/Title Dr. Caleb Popp  Date Provider Notified 01/10/21  Time Provider Notified 1432  Notification Type Page  Notification Reason Change in status (Elevated HR)  Provider response Other (Comment) (Awaiting response)  Date of Provider Response 01/10/21  Time of Provider Response 1433  Document  Patient Outcome Other (Comment) (Continue to monitor)  Progress note created (see row info) Yes  Assess: SIRS CRITERIA  SIRS Temperature  0  SIRS Pulse 1  SIRS Respirations  0  SIRS WBC 0  SIRS Score Sum  1    Pt in yellow mews, MD notified. Charge nurse notified.

## 2021-01-10 NOTE — Progress Notes (Signed)
-  40 NIF- FVC 1.5

## 2021-01-10 NOTE — Transfer of Care (Signed)
Immediate Anesthesia Transfer of Care Note  Patient: Carla Little  Procedure(s) Performed: ESOPHAGOGASTRODUODENOSCOPY (EGD) FLEXIBLE SIGMOIDOSCOPY  Patient Location: PACU  Anesthesia Type:MAC  Level of Consciousness: awake, alert  and oriented  Airway & Oxygen Therapy: Patient Spontanous Breathing and Patient connected to face mask oxygen  Post-op Assessment: Report given to RN and Post -op Vital signs reviewed and stable  Post vital signs: Reviewed and stable  Last Vitals:  Vitals Value Taken Time  BP 137/80 01/10/21 1340  Temp    Pulse 106 01/10/21 1341  Resp    SpO2 100 % 01/10/21 1341  Vitals shown include unvalidated device data.  Last Pain:  Vitals:   01/10/21 1254  TempSrc: Oral  PainSc: 0-No pain      Patients Stated Pain Goal: 0 (24/26/83 4196)  Complications: No notable events documented.

## 2021-01-10 NOTE — Progress Notes (Signed)
PROGRESS NOTE    Carla Little  QQP:[id number] DOB: Sep 05, 1990 DOA: 12/09/2020 PCP: Patient, No Pcp Per (Inactive)   Brief Narrative: Carla Little is a 30 y.o. female with a history of morbid obesity, recent left knee dislocation. Patient presented secondary to weakness with presentation concerning for Guillain Barre syndrome. Neurology consulted and patient started on IVIG. She was also found to have vitamin B12 and folate deficiencies. Patient is now awaiting rehab options for discharge.   Assessment & Plan:   Principal Problem:   Intractable vomiting with nausea Active Problems:   Left knee dislocation   PUD (peptic ulcer disease)   Nonalcoholic steatohepatitis (NASH)   Class 3 obesity (HCC)   Sinus tachycardia   Hypokalemia   GERD (gastroesophageal reflux disease)   Iron deficiency anemia due to chronic blood loss   E. coli UTI   Guillain Barr syndrome (HCC)   Guillain Barre syndrome Neuropathy Neurology consulted and recommended IVIG, for which patient completed 5 day course. Also recommended treating confounding diagnoses of vitamin B12 and folate deficiency.  -Increase to Lyrica 75 mg TID -Neurology (signed off 10/2): Vitamin B12 qweek for 4 weeks, then monthly Vitamin B12 injections.  Intractable nausea and vomiting Initially resolved, recurrent. Associated generalized abdominal pain. In setting of known gastritis. Improved again at this time. GI consulted -GI recommendations: plan for EGD today  Rectal inflammation Seen on CT imaging. Rectal thickening extending into rectosigmoid junction. Patient does mention pain with having bowel movements/wiping but otherwise did not have symptoms. She reports history of no anal related sexual activity. No systemic symptoms concerning for infection. -GI consulted. Anusol suppositories, flexible sigmoidoscopy today  Depression/anxiety Evaluated by psychiatry -Continue Buspar 5 mg BID -Continue Remeron 30 mg qHS  due to lack of response; titrate up q2 weeks as needed/tolerated  Shortness of breath Transient. Resolved.  Left knee dislocation Patient previously evaluated by orthopedic surgery with recommendation for hinge brace and to allow for joint scarring. Currently weight bearing as tolerated with limited flexion of left knee.  Sinus tachycardia This has been managed with metoprolol. Unsure of etiology. Possibly related to deconditioning. No concerning symptoms.  Iron deficiency anemia Related to heavy menstruation. Hemoglobin stable.  Elevated AST/ALT NAFLD Hyperbilirubinemia AST/ALT with initial trend upwards. No associated symptoms. Abdominal ultrasound (10/11) significant for fatty liver disease and cholelithiasis without cholecystitis. INR normal. Bilirubin stable and AST/ALT now stable but elevated. GI consulted. -CMP daily  B12 deficiency Folate deficiency B12 and folate of 153 and 3.2 respectively. Patient started on Vitamin B12 and folic acid supplementation  E. Coli UTI Completed treatment with Ceftriaxone and transitioned to Cefazolin.  Overnight hypoxia Likely patient has OSA and/or OHS judging by symptoms and body habitus. Currently managed with oxygen overnight while asleep. CT abdomen/pelvis significant for evidence of hypoventilation. Attempted to trial CPAP but patient declined treatment.  Hypocalcemia Mild and likely related to hypoalbuminemia.  Hypokalemia -Potassium supplementation as needed  LE edema Possibly related to hypoalbuminemia. No evidence of kidney or heart impairment. Liver does have some disease; normal bilirubin. No ascites on ultrasound. Could trial Lasix, however patient already has MASD in perineal area which may worsen. Plan would be to avoid foley placement.  Bilateral leg pain Low suspicion for acute DVT, however pain is apparently new onset. Difficult to assess from examination secondary to body habitus. Venous duplex was negative for acute  DVT, bilaterally.  Possible oral candidiasis Treated with Diflucan IV. Seems to be resolved.  Positive blood culture result 1/4 samples  significant for staphylococcus epidermidis. Unlikely active infection.  Morbid obesity Body mass index is 75.23 kg/m. Dietitian consulted. -Dietitian recommendations (10/12): Liberalize diet to regular to promote oral intake Will request food be chopped into bite sized pieces and dining services alert nursing staff when tray is delivered Nursing staff to assist with set-up and feeeding of meals Increase Ensure Enlive po to TID, each supplement provides 350 kcal and 20 grams of protein Continue MVI regimen   DVT prophylaxis: Lovenox Code Status:   Code Status: Full Code Family Communication: None at bedside Disposition Plan: Discharge to SNF when bed is available if remains stable.   Consultants:  Neurology Psychiatry Gastroenterology  Procedures:  None  Antimicrobials: Ceftriaxone Ancef Diflucan Flagyl    Subjective: Worked with PT yesterday and her bed was manipulated into a chair formation. She tolerated it well per her report. She did not get up on her feet. Still with numbness of her legs.   Objective: Vitals:   01/09/21 2035 01/10/21 0339 01/10/21 0447 01/10/21 1254  BP: 116/67 (!) 143/70  136/83  Pulse: 100 (!) 105  (!) 105  Resp: 16 14  (!) 25  Temp: 98.4 F (36.9 C) 98.2 F (36.8 C)  98.6 F (37 C)  TempSrc: Oral Oral  Oral  SpO2: 100% 97%  95%  Weight:   (!) 198.8 kg (!) 198.8 kg  Height:    5\' 4"  (1.626 m)    Intake/Output Summary (Last 24 hours) at 01/10/2021 1309 Last data filed at 01/09/2021 2200 Gross per 24 hour  Intake 360 ml  Output --  Net 360 ml    Filed Weights   01/09/21 0410 01/10/21 0447 01/10/21 1254  Weight: (!) 201 kg (!) 198.8 kg (!) 198.8 kg    Examination:  General exam: Appears calm and comfortable  Respiratory system: Clear to auscultation. Respiratory effort  normal. Cardiovascular system: S1 & S2 heard, RRR. No murmurs, rubs, gallops or clicks. Gastrointestinal system: Abdomen is nondistended, soft and nontender. No organomegaly or masses felt. Normal bowel sounds heard. Central nervous system: Alert and oriented. No focal neurological deficits. Musculoskeletal: BLE edema. No calf tenderness Skin: No cyanosis. No rashes Psychiatry: Judgement and insight appear normal.  Data Reviewed: I have personally reviewed following labs and imaging studies  CBC Lab Results  Component Value Date   WBC 7.6 01/08/2021   RBC 3.09 (L) 01/08/2021   HGB 9.1 (L) 01/08/2021   HCT 30.8 (L) 01/08/2021   MCV 99.7 01/08/2021   MCH 29.4 01/08/2021   PLT 452 (H) 01/08/2021   MCHC 29.5 (L) 01/08/2021   RDW 20.9 (H) 01/08/2021   LYMPHSABS 2.0 01/03/2021   MONOABS 0.9 01/03/2021   EOSABS 0.0 01/03/2021   BASOSABS 0.0 01/03/2021     Last metabolic panel Lab Results  Component Value Date   NA 135 01/10/2021   K 3.3 (L) 01/10/2021   CL 98 01/10/2021   CO2 28 01/10/2021   BUN 7 01/10/2021   CREATININE 0.30 (L) 01/10/2021   GLUCOSE 102 (H) 01/10/2021   GFRNONAA >60 01/10/2021   GFRAA >60 05/16/2018   CALCIUM 8.9 01/10/2021   PHOS 2.9 12/14/2020   PROT 8.3 (H) 01/10/2021   ALBUMIN 2.9 (L) 01/10/2021   BILITOT 1.6 (H) 01/10/2021   ALKPHOS 77 01/10/2021   AST 377 (H) 01/10/2021   ALT 128 (H) 01/10/2021   ANIONGAP 9 01/10/2021    CBG (last 3)  No results for input(s): GLUCAP in the last 72 hours.   GFR:  Estimated Creatinine Clearance: 179 mL/min (A) (by C-G formula based on SCr of 0.3 mg/dL (L)).  Coagulation Profile: Recent Labs  Lab 01/04/21 1428  INR 1.0     Recent Results (from the past 240 hour(s))  Culture, blood (routine x 2)     Status: Abnormal   Collection Time: 01/02/21  9:08 AM   Specimen: BLOOD LEFT HAND  Result Value Ref Range Status   Specimen Description   Final    BLOOD LEFT HAND Performed at [facility], [address], [place], [place] [address]    Special Requests   Final    BOTTLES DRAWN AEROBIC ONLY Blood Culture adequate volume Performed at [facility] [place], [address] [place], [place] [address]    Culture  Setup Time   Final    GRAM POSITIVE COCCI IN CLUSTERS AEROBIC BOTTLE ONLY CRITICAL RESULT CALLED TO, READ BACK BY AND VERIFIED WITH: PHARMD DREW W. [id number] FCP    Culture (A)  Final    STAPHYLOCOCCUS EPIDERMIDIS THE SIGNIFICANCE OF ISOLATING THIS ORGANISM FROM A SINGLE SET OF BLOOD CULTURES WHEN MULTIPLE SETS ARE DRAWN IS UNCERTAIN. PLEASE NOTIFY THE MICROBIOLOGY DEPARTMENT WITHIN ONE WEEK IF SPECIATION AND SENSITIVITIES ARE REQUIRED. Performed at [facility], [address], [place], [place] [address]    Report Status 01/05/2021 FINAL  Final  Culture, blood (routine x 2)     Status: None   Collection Time: 01/02/21  9:08 AM   Specimen: BLOOD RIGHT HAND  Result Value Ref Range Status   Specimen Description   Final    BLOOD RIGHT HAND Performed at [facility], [address] [place], [place] [address]    Special Requests   Final    BOTTLES DRAWN AEROBIC AND ANAEROBIC Blood Culture adequate volume Performed at [facility], [address], [place], [place] [address]    Culture   Final    NO GROWTH 5 DAYS Performed at [facility], [address] [place], [place] [address]    Report Status 01/07/2021 FINAL  Final  Blood Culture ID Panel (Reflexed)     Status: Abnormal   Collection Time: 01/02/21  9:08 AM  Result Value Ref Range Status   Enterococcus faecalis NOT DETECTED NOT DETECTED Final   Enterococcus Faecium NOT DETECTED NOT DETECTED Final   Listeria monocytogenes NOT DETECTED NOT DETECTED Final   Staphylococcus species DETECTED (A) NOT DETECTED Final    Comment: CRITICAL RESULT CALLED TO, READ BACK BY AND VERIFIED WITH: PHARMD DREW W. [phone] [id number] FCP    Staphylococcus aureus  (BCID) NOT DETECTED NOT DETECTED Final   Staphylococcus epidermidis DETECTED (A) NOT DETECTED Final    Comment: Methicillin (oxacillin) resistant coagulase negative staphylococcus. Possible blood culture contaminant (unless isolated from more than one blood culture draw or clinical case suggests pathogenicity). No antibiotic treatment is indicated for blood  culture contaminants. CRITICAL RESULT CALLED TO, READ BACK BY AND VERIFIED WITH: PHARMD DREW W. [phone] [id number] FCP    Staphylococcus lugdunensis NOT DETECTED NOT DETECTED Final   Streptococcus species NOT DETECTED NOT DETECTED Final   Streptococcus agalactiae NOT DETECTED NOT DETECTED Final   Streptococcus pneumoniae NOT DETECTED NOT DETECTED Final   Streptococcus pyogenes NOT DETECTED NOT DETECTED Final   A.calcoaceticus-baumannii NOT DETECTED NOT DETECTED Final   Bacteroides fragilis NOT DETECTED NOT DETECTED Final   Enterobacterales NOT DETECTED NOT DETECTED Final   Enterobacter cloacae complex NOT DETECTED NOT DETECTED Final  Escherichia coli NOT DETECTED NOT DETECTED Final   Klebsiella aerogenes NOT DETECTED NOT DETECTED Final   Klebsiella oxytoca NOT DETECTED NOT DETECTED Final   Klebsiella pneumoniae NOT DETECTED NOT DETECTED Final   Proteus species NOT DETECTED NOT DETECTED Final   Salmonella species NOT DETECTED NOT DETECTED Final   Serratia marcescens NOT DETECTED NOT DETECTED Final   Haemophilus influenzae NOT DETECTED NOT DETECTED Final   Neisseria meningitidis NOT DETECTED NOT DETECTED Final   Pseudomonas aeruginosa NOT DETECTED NOT DETECTED Final   Stenotrophomonas maltophilia NOT DETECTED NOT DETECTED Final   Candida albicans NOT DETECTED NOT DETECTED Final   Candida auris NOT DETECTED NOT DETECTED Final   Candida glabrata NOT DETECTED NOT DETECTED Final   Candida krusei NOT DETECTED NOT DETECTED Final   Candida parapsilosis NOT DETECTED NOT DETECTED Final   Candida tropicalis NOT DETECTED NOT DETECTED Final    Cryptococcus neoformans/gattii NOT DETECTED NOT DETECTED Final   Methicillin resistance mecA/C DETECTED (A) NOT DETECTED Final    Comment: CRITICAL RESULT CALLED TO, READ BACK BY AND VERIFIED WITH: PHARMD DREW W. [phone] FCP Performed at [facility], [address] [place], [place] [address]   Resp Panel by RT-PCR (Flu A&B, Covid) Nasopharyngeal Swab     Status: None   Collection Time: 01/06/21 10:09 AM   Specimen: Nasopharyngeal Swab; Nasopharyngeal(NP) swabs in vial transport medium  Result Value Ref Range Status   SARS Coronavirus 2 by RT PCR NEGATIVE NEGATIVE Final    Comment: (NOTE) SARS-CoV-2 target nucleic acids are NOT DETECTED.  The SARS-CoV-2 RNA is generally detectable in upper respiratory specimens during the acute phase of infection. The lowest concentration of SARS-CoV-2 viral copies this assay can detect is 138 copies/mL. A negative result does not preclude SARS-Cov-2 infection and should not be used as the sole basis for treatment or other patient management decisions. A negative result may occur with  improper specimen collection/handling, submission of specimen other than nasopharyngeal swab, presence of viral mutation(s) within the areas targeted by this assay, and inadequate number of viral copies(<138 copies/mL). A negative result must be combined with clinical observations, patient history, and epidemiological information. The expected result is Negative.  Fact Sheet for Patients:  BloggerCourse.com  Fact Sheet for Healthcare Providers:  SeriousBroker.it  This test is no t yet approved or cleared by the [place] FDA and  has been authorized for detection and/or diagnosis of SARS-CoV-2 by FDA under an Emergency Use Authorization (EUA). This EUA will remain  in effect (meaning this test can be used) for the duration of the COVID-19 declaration under Section 564(b)(1) of the Act,  21 U.S.C.section 360bbb-3(b)(1), unless the authorization is terminated  or revoked sooner.       Influenza A by PCR NEGATIVE NEGATIVE Final   Influenza B by PCR NEGATIVE NEGATIVE Final    Comment: (NOTE) The Xpert Xpress SARS-CoV-2/FLU/RSV plus assay is intended as an aid in the diagnosis of influenza from Nasopharyngeal swab specimens and should not be used as a sole basis for treatment. Nasal washings and aspirates are unacceptable for Xpert Xpress SARS-CoV-2/FLU/RSV testing.  Fact Sheet for Patients: BloggerCourse.com  Fact Sheet for Healthcare Providers: SeriousBroker.it  This test is not yet approved or cleared by the [place] FDA and has been authorized for detection and/or diagnosis of SARS-CoV-2 by FDA under an Emergency Use Authorization (EUA). This EUA will remain in effect (meaning this test can be used) for the duration of the COVID-19 declaration under Section  564(b)(1) of the Act, 21 U.S.C. section 360bbb-3(b)(1), unless the authorization is terminated or revoked.  Performed at [facility], [address] [place], [place] [address]         Radiology Studies: No results found.      Scheduled Meds:  [MAR Hold] busPIRone  5 mg Oral BID   [MAR Hold] cyanocobalamin  1,000 mcg Intramuscular Weekly   [MAR Hold] enoxaparin (LOVENOX) injection  50 mg Subcutaneous Q12H   [MAR Hold] feeding supplement  237 mL Oral TID BM   [MAR Hold] folic acid  1 mg Oral Daily   [MAR Hold] hydrocortisone  25 mg Rectal BID   [MAR Hold] lidocaine  1 patch Transdermal Daily   [MAR Hold] magic mouthwash w/lidocaine  5 mL Oral TID AC   [MAR Hold] metoprolol tartrate  25 mg Oral BID   [MAR Hold] mirtazapine  30 mg Oral QHS   [MAR Hold] multivitamin  15 mL Oral Daily   [MAR Hold] pantoprazole  40 mg Oral BID   [MAR Hold] polyethylene glycol  17 g Oral Daily   [MAR Hold] pregabalin  50 mg Oral TID   [MAR  Hold] senna-docusate  1 tablet Oral BID   [MAR Hold] sodium chloride flush  3 mL Intravenous Q12H   Continuous Infusions:  [MAR Hold] sodium chloride 10 mL/hr at 01/07/21 1710   sodium chloride       LOS: 31 days     Jacquelin Hawking, MD [facility] 01/10/2021, 1:09 PM  If 7PM-7AM, please contact night-coverage [email or web address]

## 2021-01-11 DIAGNOSIS — R601 Generalized edema: Secondary | ICD-10-CM

## 2021-01-11 LAB — CBC
HCT: 30.3 % — ABNORMAL LOW (ref 36.0–46.0)
Hemoglobin: 9 g/dL — ABNORMAL LOW (ref 12.0–15.0)
MCH: 29.5 pg (ref 26.0–34.0)
MCHC: 29.7 g/dL — ABNORMAL LOW (ref 30.0–36.0)
MCV: 99.3 fL (ref 80.0–100.0)
Platelets: 491 10*3/uL — ABNORMAL HIGH (ref 150–400)
RBC: 3.05 MIL/uL — ABNORMAL LOW (ref 3.87–5.11)
RDW: 20.5 % — ABNORMAL HIGH (ref 11.5–15.5)
WBC: 8.8 10*3/uL (ref 4.0–10.5)
nRBC: 0 % (ref 0.0–0.2)

## 2021-01-11 LAB — COMPREHENSIVE METABOLIC PANEL
ALT: 121 U/L — ABNORMAL HIGH (ref 0–44)
AST: 363 U/L — ABNORMAL HIGH (ref 15–41)
Albumin: 2.8 g/dL — ABNORMAL LOW (ref 3.5–5.0)
Alkaline Phosphatase: 73 U/L (ref 38–126)
Anion gap: 7 (ref 5–15)
BUN: 6 mg/dL (ref 6–20)
CO2: 30 mmol/L (ref 22–32)
Calcium: 8.8 mg/dL — ABNORMAL LOW (ref 8.9–10.3)
Chloride: 99 mmol/L (ref 98–111)
Creatinine, Ser: <0.3 mg/dL — ABNORMAL LOW (ref 0.44–1.00)
Glucose, Bld: 100 mg/dL — ABNORMAL HIGH (ref 70–99)
Potassium: 3.5 mmol/L (ref 3.5–5.1)
Sodium: 136 mmol/L (ref 135–145)
Total Bilirubin: 1.4 mg/dL — ABNORMAL HIGH (ref 0.3–1.2)
Total Protein: 8 g/dL (ref 6.5–8.1)

## 2021-01-11 MED ORDER — ADULT MULTIVITAMIN W/MINERALS CH
1.0000 | ORAL_TABLET | Freq: Every day | ORAL | Status: DC
  Administered 2021-01-11 – 2021-01-26 (×15): 1 via ORAL
  Filled 2021-01-11 (×15): qty 1

## 2021-01-11 MED ORDER — FUROSEMIDE 10 MG/ML IJ SOLN
40.0000 mg | Freq: Once | INTRAMUSCULAR | Status: AC
  Administered 2021-01-11: 40 mg via INTRAVENOUS
  Filled 2021-01-11: qty 4

## 2021-01-11 NOTE — Progress Notes (Signed)
[facility] Progress Note  Carla Little 30 y.o. 01/22/1991  CC:  Intractable vomiting with nausea, abnormal CT scan   Subjective: Patient states she has had some abdominal pain since the procedure. Had a BM last night. Denies nausea/vomiting.  ROS : Review of Systems  Cardiovascular:  Negative for chest pain and palpitations.  Gastrointestinal:  Positive for abdominal pain. Negative for blood in stool, constipation, diarrhea, heartburn, melena, nausea and vomiting.     Objective: Vital signs in last 24 hours: Vitals:   01/11/21 0242 01/11/21 0447  BP: 116/69 (!) 108/57  Pulse: (!) 110 (!) 104  Resp: 18 18  Temp: 98.1 F (36.7 C) 99.8 F (37.7 C)  SpO2: 96% 97%    Physical Exam:  General:  Alert, cooperative, no distress, morbidly obese  Head:  Normocephalic, without obvious abnormality, atraumatic  Eyes:  Anicteric sclera, EOM's intact  Lungs:   Clear to auscultation bilaterally, respirations unlabored  Heart:  Regular rate and rhythm, S1, S2 normal  Abdomen:   Soft, mild diffuse tenderness, bowel sounds active all four quadrants,  no masses,     Lab Results: Recent Labs    01/10/21 0439 01/11/21 0458  NA 135 136  K 3.3* 3.5  CL 98 99  CO2 28 30  GLUCOSE 102* 100*  BUN 7 6  CREATININE 0.30* <0.30*  CALCIUM 8.9 8.8*   Recent Labs    01/10/21 0439 01/11/21 0458  AST 377* 363*  ALT 128* 121*  ALKPHOS 77 73  BILITOT 1.6* 1.4*  PROT 8.3* 8.0  ALBUMIN 2.9* 2.8*   Recent Labs    01/11/21 0458  WBC 8.8  HGB 9.0*  HCT 30.3*  MCV 99.3  PLT 491*   No results for input(s): LABPROT, INR in the last 72 hours.    Assessment Intractable vomiting with nausea - RUQ [place]: cholelithiasis. - EGD 10/18: no gross lesions in stomach, normal duodenum, no specimens collected.   Rectal inflammation, Abnormal CT - CT ab/pelvis with contrast 10/14: rectal wall thickening extending into the rectosigmoid junction with may reflect infectious or  inflammatory proctitis. - CRP 5.7, ESR 90 - Flex sig 10/19: prep was poor, extensive stool in rectum limited visualization, no specimens collected. Internal hemorrhoids.   Elevated LFTs - AST 363/ALT 121/ Alk Phos 73(AST trending down) -T. Bili 1.4 - [place] RUQ 10/11: moderate to severe fatty liver, cholelithiasis without acute cholecystitis   Vitamin B and Folate deficiency (macrocytic anemia) - HGB 9.1 (improved from 8.7 yesterday)   Plan: EGD unrevealing, nausea and vomiting have resolved.  Flexible sigmoidoscopy was unrevealing due to poor prep. Patient could benefit from outpatient repeat flexible sigmoidoscopy to further investigate abnormal CT scan findings.  Continue supportive care.  Also, recommend outpatient follow up with surgery regarding cholelithiasis if nausea and vomiting return.  [facility] will sign off. Please contact us if we can be of any further assistance during this hospital stay.   Garnette Scheuermann PA-C 01/11/2021, 11:23 AM  Contact #  [phone]

## 2021-01-11 NOTE — Plan of Care (Signed)

## 2021-01-11 NOTE — Progress Notes (Signed)
Nutrition Follow-up  DOCUMENTATION CODES:   Morbid obesity  INTERVENTION:   -Ensure Plus PO TID, each provides 350 kcals and 13g protein  -Magic cup BID with meals, each supplement provides 290 kcal and 9 grams of protein   -Multivitamin with minerals daily  NUTRITION DIAGNOSIS:   Inadequate oral intake related to poor appetite (difficulty with self-feeding) as evidenced by meal completion < 25%, per patient/family report.  Ongoing.  GOAL:   Patient will meet greater than or equal to 90% of their needs  Progressing.  MONITOR:   PO intake, Supplement acceptance, Weight trends  REASON FOR ASSESSMENT:   Consult Assessment of nutrition requirement/status  ASSESSMENT:   30 y.o. female with history of class III obesity, depression, GERD, nonalcoholic steatohepatitis, and PUD presented to ED 9/16 after being dc 9/5 after a month long admission with complaints of worsening nausea and vomiting at home.  10/18: s/p EGD, flex sigmoidoscopy  Patient states she still isn't eating that much but states she drinks her Ensures (currently ordered for TID). Pt requires feeding assistance. She is willing to try Magic Cups as well for additional kcals and protein.   Admission weight: 427 lbs. Current weight: 434 lbs  I/Os: +1.1L since 10/5 UOP: 800 ml x 24 hrs  Medications: folic acid, IV Lasix, Magic mouthwash w/ lidocaine, Remeron  Labs reviewed.  NUTRITION - FOCUSED PHYSICAL EXAM:  No depletions identified. May be masked by morbid obesity.   Diet Order:   Diet Order             DIET DYS 3 Room service appropriate? Yes; Fluid consistency: Thin  Diet effective now                   EDUCATION NEEDS:   Education needs have been addressed  Skin:  Skin Assessment: Reviewed RN Assessment  Last BM:  10/19  Height:   Ht Readings from Last 1 Encounters:  01/10/21 5\' 4"  (1.626 m)    Weight:   Wt Readings from Last 1 Encounters:  01/11/21 (!) 197.3 kg     Ideal Body Weight:  54.5 kg  BMI:  Body mass index is 74.66 kg/m.  Estimated Nutritional Needs:   Kcal:  2000-2200 kcal/d  Protein:  100-110 g/d  Fluid:  1.8-2 L/d  01/13/21, MS, RD, LDN Inpatient Clinical Dietitian Contact information available via Amion

## 2021-01-11 NOTE — Progress Notes (Signed)
Physical Therapy Treatment Patient Details Name: Carla Little MRN: [id number] DOB: 25-Apr-1990 Today's Date: 01/11/2021   History of Present Illness Patient is a 30 y.o. female who presented to [facility] for intractable N/V on 9/17. ED labwork reveals e. coli UTI. Pt had recent hospital admission from 8/8-9/5 due to Lt knee dislocation which was reduced with fractures of the proximal fibula ligamentous avulsion laterally as well as medially off the medial femoral condyle and MRI showed complete ACL & PCL tears and MCL strain. That hospital admission was complicated by nausea, vomiting, tachycardia, UTI, and fecal impaction;. PMH significant for morbid obesity, GERD, fatty liver disease, depression. Patient now with Guillian-Barre syndrome    PT Comments    The patient participated in sitting upright in bed chair position x 30' without a rest break and no back support. Patient participated  in Neuromuscular reed exercises while sitting upright. Patient demonstrates trunk control. At times listing to the right but able to reposition to midline.  Continue tilting toward standing next visit if tolerated.and deemed safe.   Recommendations for follow up therapy are one component of a multi-disciplinary discharge planning process, led by the attending physician.  Recommendations may be updated based on patient status, additional functional criteria and insurance authorization.  Follow Up Recommendations  SNF     Equipment Recommendations  Wheelchair cushion (measurements PT);Wheelchair (measurements PT)    Recommendations for Other Services       Precautions / Restrictions Precautions Precautions: Fall Other Brace: does not need KI at this time but may consider since opatient has more weakness due GBS     Mobility  Bed Mobility       General bed mobility comments: patient positioned in bed, slowly raised HOB to max 65* and lowered legs so knees bent at ~ 40*. Patient assisted once to lean  forward, reaching for rails to assist self. Patient sat upright without a rest x 30 ".  ( Have not attempted swinging legs over bed edge  and sitting on side).  Transfers Overall transfer level: Needs assistance                  Ambulation/Gait                 Stairs             Wheelchair Mobility    Modified Rankin (Stroke Patients Only)       Balance Overall balance assessment: Needs assistance Sitting-balance support: No upper extremity supported Sitting balance-Leahy Scale: Fair (in bed/chair position) Sitting balance - Comments: once patient leaned forward, HOB dropped. Patient sat freely x 30". Patient able to rotate trunk, lean forward and raise back up to stretch back.                                    Cognition Arousal/Alertness: Awake/alert Behavior During Therapy: WFL for tasks assessed/performed Overall Cognitive Status: Within Functional Limits for tasks assessed                                 General Comments: much perkier and interactive and conversive. States'I am trying hard and I am going to beat this."      Exercises General Exercises - Upper Extremity Shoulder Flexion: AAROM;Both;10 reps;Seated Shoulder Extension: Strengthening;10 reps;Seated;Theraband Theraband Level (Shoulder Extension): Level 2 (Red) Elbow Flexion: Strengthening;Both;10 reps;Seated;Theraband  Theraband Level (Elbow Flexion): Level 2 (Red) Elbow Extension: Strengthening;Both;10 reps;Seated;Theraband Theraband Level (Elbow Extension): Level 2 (Red) General Exercises - Lower Extremity Ankle Circles/Pumps: AROM;Both;10 reps;Strengthening;Supine Quad Sets: AROM;Both;Supine Short Arc Quad: AAROM;10 reps;Both;Supine Heel Slides: AAROM;Both;10 reps;Supine Hip ABduction/ADduction: AAROM;Both;10 reps;Supine Other Exercises Other Exercises: PNF  x 10 each UE diagonals with assistance Other Exercises: Balloon bat x 10 out( ext. , catch  ball x 5_difficulty to throw.  bat ballon up x 109elb ext.) Other Exercises: trunk rotation x 5 to each side and forward stretch, reaching UE's forward toward feet. maintained x 1 minute intervals x 5.    General Comments        Pertinent Vitals/Pain Faces Pain Scale: Hurts a little bit Pain Location: reports no back pain when stretching while sitting Pain Descriptors / Indicators: Discomfort Pain Intervention(s): Premedicated before session    Home Living                      Prior Function            PT Goals (current goals can now be found in the care plan section) Progress towards PT goals: Progressing toward goals    Frequency    Min 2X/week      PT Plan Current plan remains appropriate    Co-evaluation              AM-PAC PT "6 Clicks" Mobility   Outcome Measure  Help needed turning from your back to your side while in a flat bed without using bedrails?: Total Help needed moving from lying on your back to sitting on the side of a flat bed without using bedrails?: Total Help needed moving to and from a bed to a chair (including a wheelchair)?: Total Help needed standing up from a chair using your arms (e.g., wheelchair or bedside chair)?: Total Help needed to walk in hospital room?: Total Help needed climbing 3-5 steps with a railing? : Total 6 Click Score: 6    End of Session   Activity Tolerance: Patient tolerated treatment well Patient left: in bed;with call bell/phone within reach Nurse Communication: Mobility status;Need for lift equipment PT Visit Diagnosis: Muscle weakness (generalized) (M62.81);Other symptoms and signs involving the nervous system (R29.898) Pain - Right/Left: Left Pain - part of body: Knee     Time: 1540-1620 PT Time Calculation (min) (ACUTE ONLY): 40 min  Charges:  $Therapeutic Activity: 8-22 mins $Neuromuscular Re-education: 23-37 mins                     Blanchard Kelch PT Acute Rehabilitation Services Pager  [phone] Office [phone]    Rada Hay 01/11/2021, 5:04 PM

## 2021-01-11 NOTE — Progress Notes (Addendum)
Physical Therapy Treatment Patient Details Name: Carla Little MRN: [id number] DOB: 1991-02-19 Today's Date: 01/11/2021   History of Present Illness Patient is a 30 y.o. female who presented to [facility] for intractable N/V on 9/17. ED labwork reveals e. coli UTI. Pt had recent hospital admission from 8/8-9/5 due to Lt knee dislocation which was reduced with fractures of the proximal fibula ligamentous avulsion laterally as well as medially off the medial femoral condyle and MRI showed complete ACL & PCL tears and MCL strain. That hospital admission was complicated by nausea, vomiting, tachycardia, UTI, and fecal impaction;. PMH significant for morbid obesity, GERD, fatty liver disease, depression. Patient now with Guillian-Barre syndrome    PT Comments    Patient continues with significant edema of all extremeities. Have not been able to place clogs on feet for stability when sitting in bed upright due to edema. MD in and noted.  Patient participated in rolling and positioning in bed.  Performed NMR exercises for U and LE.  Very difficult to move legs due to gravity and body habitus and weight of legs.  Patient reports that she cannot really tell when she is moving the feet or legs if not able to visualize.   Tends to  position right leg in ER and knee flexed, difficulty in assisting internal rotation positioning.   Continue sitting in bed chair position next visit. Patient asked what her chances are to walk again. Encouraged patient that progress is slow but attainable. Patient asked what severity her illness is . Again, explained that  PT notes slow gains in strength and for her to keep moving limbs as much as she can muster.  Recommendations for follow up therapy are one component of a multi-disciplinary discharge planning process, led by the attending physician.  Recommendations may be updated based on patient status, additional functional criteria and insurance authorization.  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 Patient does initiate rolling to back.    Transfers                    Ambulation/Gait                 Stairs             Wheelchair Mobility    Modified Rankin (Stroke Patients Only)       Balance                                            Cognition Arousal/Alertness: Awake/alert Behavior During Therapy: Flat affect Overall Cognitive Status: Within Functional Limits for tasks assessed  General Comments: patient flat affect, appears somewhat sleepy? meds. Did perk up when working on exercises.      Exercises General Exercises - Upper Extremity Shoulder Flexion: AAROM;10 reps (seated upright in bed) Elbow Flexion: 15 reps;Both;Supine;AROM General Exercises - Lower Extremity Ankle Circles/Pumps: AROM;Both;10 reps;Strengthening;Supine Quad Sets: AROM;Both;Supine Short Arc Quad: AAROM;10 reps;Both;Supine Heel Slides: AAROM;Both;10 reps;Supine Hip ABduction/ADduction: AAROM;Both;10 reps;Supine Other Exercises Other Exercises: PNF  x 10 each UE diagonals with assistance    General Comments        Pertinent Vitals/Pain Faces Pain Scale: Hurts a little bit Pain Location: left knee. Pain Descriptors / Indicators:  Discomfort Pain Intervention(s): Monitored during session;Premedicated before session    Home Living                      Prior Function            PT Goals (current goals can now be found in the care plan section) Progress towards PT goals: Progressing toward goals    Frequency    Min 2X/week      PT Plan Current plan remains appropriate    Co-evaluation              AM-PAC PT "6 Clicks" Mobility   Outcome Measure  Help needed turning from your back to your side while in a flat bed without using bedrails?: Total Help needed moving from lying on your back to sitting on the side of a flat bed without using bedrails?: Total Help needed moving to and from a bed to a chair (including a wheelchair)?: Total Help needed standing up from a chair using your arms (e.g., wheelchair or bedside chair)?: Total Help needed to walk in hospital room?: Total Help needed climbing 3-5 steps with a railing? : Total 6 Click Score: 6    End of Session   Activity Tolerance: Patient tolerated treatment well Patient left: in bed;with call bell/phone within reach Nurse Communication: Mobility status PT Visit Diagnosis: Muscle weakness (generalized) (M62.81);Other symptoms and signs involving the nervous system (R29.898) Pain - Right/Left: Left Pain - part of body: Knee     Time: 6468-0321 PT Time Calculation (min) (ACUTE ONLY): 60 min  Charges:  $Therapeutic Activity: 23-37 mins $Neuromuscular Re-education: 23-37 mins                     Blanchard Kelch PT Acute Rehabilitation Services Pager [phone] Office [phone]    Rada Hay 01/11/2021, 4:47 PM

## 2021-01-11 NOTE — Progress Notes (Signed)
The patient is injury-free, afebrile, alert, and oriented X 4. Vital signs were within the baseline during this shift. She complained of generalized pain. Pt denies chest pain, SOB, nausea, vomiting, dizziness, signs or symptoms of bleeding or infection or acute changes during this shift. We will continue to monitor and work toward achieving the care plan goals.

## 2021-01-11 NOTE — Progress Notes (Signed)
PROGRESS NOTE    Carla Little  XTG:[id number] DOB: [birth year] DOA: 12/09/2020 PCP: Patient, No Pcp Per (Inactive)   Brief Narrative: Carla Little is a 30 y.o. female with a history of morbid obesity, recent left knee dislocation. Patient presented secondary to weakness with presentation concerning for Guillain Barre syndrome. Neurology consulted and patient started on IVIG. She was also found to have vitamin B12 and folate deficiencies. Patient is now awaiting rehab options for discharge.  Developed nausea, vomiting, rectal pain/abnormality on CT, [facility] consulted and s/p colonoscopy and EGD 10/18.   Assessment & Plan:   Principal Problem:   Intractable vomiting with nausea Active Problems:   Left knee dislocation   PUD (peptic ulcer disease)   Nonalcoholic steatohepatitis (NASH)   Class 3 obesity (HCC)   Sinus tachycardia   Hypokalemia   GERD (gastroesophageal reflux disease)   Iron deficiency anemia due to chronic blood loss   E. coli UTI   Guillain Barr syndrome (HCC)   Guillain Barre syndrome Neuropathy Neurology consulted and completed IVIG x5 days as per recommendations. Also recommended treating confounding diagnoses of vitamin B12 and folate deficiency.  -Increased to Lyrica 75 mg TID -Neurology (signed off 10/2): Vitamin B12 qweek for 4 weeks, then monthly Vitamin B12 injections. - Consider outpatient follow-up with neurology.  Intractable nausea and vomiting Initially resolved, recurrent. Associated generalized abdominal pain. In setting of known gastritis. Improved again at this time. GI consulted -[facility] consulted, s/p EGD 10/18 without any acute abnormalities noted. - Diet as tolerated.  Rectal inflammation Seen on CT imaging. Rectal thickening extending into rectosigmoid junction. Patient does mention pain with having bowel movements/wiping but otherwise did not have symptoms. She reports history of no anal related sexual activity. No systemic  symptoms concerning for infection. -GI consulted. Anusol suppositories, s/p flexible sigmoidoscopy which was not an adequate prep but did not show any significant abnormalities.  May need a repeat study as outpatient.  Depression/anxiety Evaluated by psychiatry -Continue Buspar 5 mg BID -Continue Remeron 30 mg qHS due to lack of response; titrate up q2 weeks as needed/tolerated  Shortness of breath Transient. Resolved.  Left knee dislocation Patient previously evaluated by orthopedic surgery with recommendation for hinge brace and to allow for joint scarring. Currently weight bearing as tolerated with limited flexion of left knee.  Sinus tachycardia This has been managed with metoprolol. Unsure of etiology. Possibly related to deconditioning. No concerning symptoms.  Iron deficiency anemia Related to heavy menstruation. Hemoglobin stable in the 9 g range..  Elevated AST/ALT NAFLD Hyperbilirubinemia AST/ALT with initial trend upwards. No associated symptoms. Abdominal ultrasound (10/11) significant for fatty liver disease and cholelithiasis without cholecystitis. INR normal. Bilirubin stable and AST/ALT now stable but elevated. GI consulted. -AST and ALT stable.  Periodic follow-up with labs and follow-up as outpatient.  B12 deficiency Folate deficiency B12 and folate of 153 and 3.2 respectively. Patient started on Vitamin B12 and folic acid supplementation  E. Coli UTI Completed treatment with Ceftriaxone and transitioned to Cefazolin.  Overnight hypoxia Likely patient has OSA and/or OHS judging by symptoms and body habitus. Currently managed with oxygen overnight while asleep. CT abdomen/pelvis significant for evidence of hypoventilation. Attempted to trial CPAP but patient declined treatment. Could consider nocturnal oxygen saturation monitoring.  Hypocalcemia Mild and likely related to hypoalbuminemia.  Hypokalemia -Potassium supplementation as needed  LE  edema/anasarca Possibly related to hypoalbuminemia. No evidence of kidney or heart impairment. Liver does have some disease; normal bilirubin. No ascites on  ultrasound. Significant bilateral upper extremity pitting edema.  DC IV fluids.  Initiation of IV Lasix 40 mg daily for a couple days, will dose on a daily basis.  Bilateral leg pain Low suspicion for acute DVT, however pain is apparently new onset. Difficult to assess from examination secondary to body habitus. Venous duplex was negative for acute DVT, bilaterally.  Possible oral candidiasis Treated with Diflucan IV.  Resolved.  Positive blood culture result 1/4 samples significant for staphylococcus epidermidis. Unlikely active infection.  Morbid obesity Body mass index is 74.66 kg/m. Dietitian consulted. -Dietitian recommendations (10/12): Liberalize diet to regular to promote oral intake Will request food be chopped into bite sized pieces and dining services alert nursing staff when tray is delivered Nursing staff to assist with set-up and feeeding of meals Increase Ensure Enlive po to TID, each supplement provides 350 kcal and 20 grams of protein Continue MVI regimen   DVT prophylaxis: Lovenox Code Status:   Code Status: Full Code Family Communication: None at bedside Disposition Plan: DC to SNF pending bed.  Discussed with TOC during morning rounds.   Consultants:  Neurology Psychiatry Gastroenterology  Procedures:  None  Antimicrobials: Ceftriaxone Ancef Diflucan Flagyl    Subjective: Evaluated patient along with PT in the room.  Patient reports last BM yesterday.  Denies rectal bleeding or pain.  Tolerating diet without nausea or vomiting.  Denies any complaints.  PT however has noticed edema especially in upper extremities and puffiness compared to prior evaluation in August.  Objective: Vitals:   01/10/21 1827 01/10/21 2217 01/11/21 0242 01/11/21 0447  BP: 126/78 122/68 116/69 (!) 108/57  Pulse: (!)  109 (!) 103 (!) 110 (!) 104  Resp: 18 20 18 18   Temp: 98.6 F (37 C) 98.1 F (36.7 C) 98.1 F (36.7 C) 99.8 F (37.7 C)  TempSrc:  Oral Oral Oral  SpO2: 98% 100% 96% 97%  Weight:    (!) 197.3 kg  Height:        Intake/Output Summary (Last 24 hours) at 01/11/2021 1008 Last data filed at 01/10/2021 1342 Gross per 24 hour  Intake 100 ml  Output --  Net 100 ml   Filed Weights   01/10/21 0447 01/10/21 1254 01/11/21 0447  Weight: (!) 198.8 kg (!) 198.8 kg (!) 197.3 kg    Examination:  General exam: Pleasant young female, very morbidly obese, lying comfortably propped up in bed without distress. Respiratory system: Clear to auscultation.  No increased work of breathing. Cardiovascular system: S1 & S2 heard, RRR. No murmurs, rubs, gallops or clicks.  No pedal edema but has pitting bilateral upper extremity edema up to elbows and may be 1 proximally but hard to appreciate due to body habitus.  No leg edema but may have sacral edema which is hard to examine given her body habitus. Gastrointestinal system: Abdomen is nondistended, soft and nontender. No organomegaly or masses felt. Normal bowel sounds heard. Central nervous system: Alert and oriented. No focal neurological deficits. Musculoskeletal: Moves all extremities symmetrically. Skin: No cyanosis. No rashes Psychiatry: Judgement and insight appear normal.  Data Reviewed: I have personally reviewed following labs and imaging studies  CBC Lab Results  Component Value Date   WBC 8.8 01/11/2021   RBC 3.05 (L) 01/11/2021   HGB 9.0 (L) 01/11/2021   HCT 30.3 (L) 01/11/2021   MCV 99.3 01/11/2021   MCH 29.5 01/11/2021   PLT 491 (H) 01/11/2021   MCHC 29.7 (L) 01/11/2021   RDW 20.5 (H) 01/11/2021   LYMPHSABS  2.0 01/03/2021   MONOABS 0.9 01/03/2021   EOSABS 0.0 01/03/2021   BASOSABS 0.0 01/03/2021     Last metabolic panel Lab Results  Component Value Date   NA 136 01/11/2021   K 3.5 01/11/2021   CL 99 01/11/2021   CO2  30 01/11/2021   BUN 6 01/11/2021   CREATININE <0.30 (L) 01/11/2021   GLUCOSE 100 (H) 01/11/2021   GFRNONAA NOT CALCULATED 01/11/2021   GFRAA >60 05/16/2018   CALCIUM 8.8 (L) 01/11/2021   PHOS 2.9 12/14/2020   PROT 8.0 01/11/2021   ALBUMIN 2.8 (L) 01/11/2021   BILITOT 1.4 (H) 01/11/2021   ALKPHOS 73 01/11/2021   AST 363 (H) 01/11/2021   ALT 121 (H) 01/11/2021   ANIONGAP 7 01/11/2021    CBG (last 3)  No results for input(s): GLUCAP in the last 72 hours.   GFR: CrCl cannot be calculated (This lab value cannot be used to calculate CrCl because it is not a number: <0.30).  Coagulation Profile: Recent Labs  Lab 01/04/21 1428  INR 1.0    Recent Results (from the past 240 hour(s))  Culture, blood (routine x 2)     Status: Abnormal   Collection Time: 01/02/21  9:08 AM   Specimen: BLOOD LEFT HAND  Result Value Ref Range Status   Specimen Description   Final    BLOOD LEFT HAND Performed at [facility], [address] [place], [place] [address]    Special Requests   Final    BOTTLES DRAWN AEROBIC ONLY Blood Culture adequate volume Performed at [facility], [address], [place], [place] [address]    Culture  Setup Time   Final    GRAM POSITIVE COCCI IN CLUSTERS AEROBIC BOTTLE ONLY CRITICAL RESULT CALLED TO, READ BACK BY AND VERIFIED WITH: PHARMD DREW W. [phone] [id number] FCP    Culture (A)  Final    STAPHYLOCOCCUS EPIDERMIDIS THE SIGNIFICANCE OF ISOLATING THIS ORGANISM FROM A SINGLE SET OF BLOOD CULTURES WHEN MULTIPLE SETS ARE DRAWN IS UNCERTAIN. PLEASE NOTIFY THE MICROBIOLOGY DEPARTMENT WITHIN ONE WEEK IF SPECIATION AND SENSITIVITIES ARE REQUIRED. Performed at [facility], [address], [place], [place] [address]    Report Status 01/05/2021 FINAL  Final  Culture, blood (routine x 2)     Status: None   Collection Time: 01/02/21  9:08 AM   Specimen: BLOOD RIGHT HAND  Result Value Ref Range Status   Specimen Description   Final     BLOOD RIGHT HAND Performed at [facility], [address], [place], [place] [address]    Special Requests   Final    BOTTLES DRAWN AEROBIC AND ANAEROBIC Blood Culture adequate volume Performed at [facility], [address] [place], [place] [address]    Culture   Final    NO GROWTH 5 DAYS Performed at [facility], [address], [place], [place] [address]    Report Status 01/07/2021 FINAL  Final  Blood Culture ID Panel (Reflexed)     Status: Abnormal   Collection Time: 01/02/21  9:08 AM  Result Value Ref Range Status   Enterococcus faecalis NOT DETECTED NOT DETECTED Final   Enterococcus Faecium NOT DETECTED NOT DETECTED Final   Listeria monocytogenes NOT DETECTED NOT DETECTED Final   Staphylococcus species DETECTED (A) NOT DETECTED Final    Comment: CRITICAL RESULT CALLED TO, READ BACK BY AND VERIFIED WITH: PHARMD DREW W. [id number] FCP    Staphylococcus  aureus (BCID) NOT DETECTED NOT DETECTED Final   Staphylococcus epidermidis DETECTED (A) NOT DETECTED Final    Comment: Methicillin (oxacillin) resistant coagulase negative staphylococcus. Possible blood culture contaminant (unless isolated from more than one blood culture draw or clinical case suggests pathogenicity). No antibiotic treatment is indicated for blood  culture contaminants. CRITICAL RESULT CALLED TO, READ BACK BY AND VERIFIED WITH: PHARMD DREW W. [id number] [id number] FCP    Staphylococcus lugdunensis NOT DETECTED NOT DETECTED Final   Streptococcus species NOT DETECTED NOT DETECTED Final   Streptococcus agalactiae NOT DETECTED NOT DETECTED Final   Streptococcus pneumoniae NOT DETECTED NOT DETECTED Final   Streptococcus pyogenes NOT DETECTED NOT DETECTED Final   A.calcoaceticus-baumannii NOT DETECTED NOT DETECTED Final   Bacteroides fragilis NOT DETECTED NOT DETECTED Final   Enterobacterales NOT DETECTED NOT DETECTED Final   Enterobacter cloacae complex NOT DETECTED NOT  DETECTED Final   Escherichia coli NOT DETECTED NOT DETECTED Final   Klebsiella aerogenes NOT DETECTED NOT DETECTED Final   Klebsiella oxytoca NOT DETECTED NOT DETECTED Final   Klebsiella pneumoniae NOT DETECTED NOT DETECTED Final   Proteus species NOT DETECTED NOT DETECTED Final   Salmonella species NOT DETECTED NOT DETECTED Final   Serratia marcescens NOT DETECTED NOT DETECTED Final   Haemophilus influenzae NOT DETECTED NOT DETECTED Final   Neisseria meningitidis NOT DETECTED NOT DETECTED Final   Pseudomonas aeruginosa NOT DETECTED NOT DETECTED Final   Stenotrophomonas maltophilia NOT DETECTED NOT DETECTED Final   Candida albicans NOT DETECTED NOT DETECTED Final   Candida auris NOT DETECTED NOT DETECTED Final   Candida glabrata NOT DETECTED NOT DETECTED Final   Candida krusei NOT DETECTED NOT DETECTED Final   Candida parapsilosis NOT DETECTED NOT DETECTED Final   Candida tropicalis NOT DETECTED NOT DETECTED Final   Cryptococcus neoformans/gattii NOT DETECTED NOT DETECTED Final   Methicillin resistance mecA/C DETECTED (A) NOT DETECTED Final    Comment: CRITICAL RESULT CALLED TO, READ BACK BY AND VERIFIED WITH: PHARMD DREW W. [phone] FCP Performed at [facility], [address], [place], [place] [address]   Resp Panel by RT-PCR (Flu A&B, Covid) Nasopharyngeal Swab     Status: None   Collection Time: 01/06/21 10:09 AM   Specimen: Nasopharyngeal Swab; Nasopharyngeal(NP) swabs in vial transport medium  Result Value Ref Range Status   SARS Coronavirus 2 by RT PCR NEGATIVE NEGATIVE Final    Comment: (NOTE) SARS-CoV-2 target nucleic acids are NOT DETECTED.  The SARS-CoV-2 RNA is generally detectable in upper respiratory specimens during the acute phase of infection. The lowest concentration of SARS-CoV-2 viral copies this assay can detect is 138 copies/mL. A negative result does not preclude SARS-Cov-2 infection and should not be used as the sole basis for treatment or other  patient management decisions. A negative result may occur with  improper specimen collection/handling, submission of specimen other than nasopharyngeal swab, presence of viral mutation(s) within the areas targeted by this assay, and inadequate number of viral copies(<138 copies/mL). A negative result must be combined with clinical observations, patient history, and epidemiological information. The expected result is Negative.  Fact Sheet for Patients:  BloggerCourse.com  Fact Sheet for Healthcare Providers:  SeriousBroker.it  This test is no t yet approved or cleared by the [place] FDA and  has been authorized for detection and/or diagnosis of SARS-CoV-2 by FDA under an Emergency Use Authorization (EUA). This EUA will remain  in effect (meaning this test can be used) for the duration of the COVID-19 declaration  under Section 564(b)(1) of the Act, 21 U.S.C.section 360bbb-3(b)(1), unless the authorization is terminated  or revoked sooner.       Influenza A by PCR NEGATIVE NEGATIVE Final   Influenza B by PCR NEGATIVE NEGATIVE Final    Comment: (NOTE) The Xpert Xpress SARS-CoV-2/FLU/RSV plus assay is intended as an aid in the diagnosis of influenza from Nasopharyngeal swab specimens and should not be used as a sole basis for treatment. Nasal washings and aspirates are unacceptable for Xpert Xpress SARS-CoV-2/FLU/RSV testing.  Fact Sheet for Patients: BloggerCourse.com  Fact Sheet for Healthcare Providers: SeriousBroker.it  This test is not yet approved or cleared by the [place] FDA and has been authorized for detection and/or diagnosis of SARS-CoV-2 by FDA under an Emergency Use Authorization (EUA). This EUA will remain in effect (meaning this test can be used) for the duration of the COVID-19 declaration under Section 564(b)(1) of the Act, 21 U.S.C. section  360bbb-3(b)(1), unless the authorization is terminated or revoked.  Performed at [facility], [address] [place], [place] [address]         Radiology Studies: No results found.      Scheduled Meds:  busPIRone  5 mg Oral BID   cyanocobalamin  1,000 mcg Intramuscular Weekly   enoxaparin (LOVENOX) injection  50 mg Subcutaneous Q12H   feeding supplement  237 mL Oral TID BM   folic acid  1 mg Oral Daily   hydrocortisone  25 mg Rectal BID   lidocaine  1 patch Transdermal Daily   magic mouthwash w/lidocaine  5 mL Oral TID AC   metoprolol tartrate  25 mg Oral BID   mirtazapine  30 mg Oral QHS   multivitamin  15 mL Oral Daily   pantoprazole  40 mg Oral BID   polyethylene glycol  17 g Oral Daily   pregabalin  75 mg Oral TID   senna-docusate  1 tablet Oral BID   sodium chloride flush  3 mL Intravenous Q12H   Continuous Infusions:  sodium chloride 10 mL/hr at 01/07/21 1710   sodium chloride       LOS: 32 days    Marcellus Scott, MD, [place], [facility]. [facility]  To contact the attending provider between 7A-7P or the covering provider during after hours 7P-7A, please log into the web site [email or web address] and access using universal  password for that web site. If you do not have the password, please call the hospital operator.

## 2021-01-12 LAB — COMPREHENSIVE METABOLIC PANEL
ALT: 127 U/L — ABNORMAL HIGH (ref 0–44)
AST: 360 U/L — ABNORMAL HIGH (ref 15–41)
Albumin: 2.9 g/dL — ABNORMAL LOW (ref 3.5–5.0)
Alkaline Phosphatase: 76 U/L (ref 38–126)
Anion gap: 9 (ref 5–15)
BUN: 6 mg/dL (ref 6–20)
CO2: 29 mmol/L (ref 22–32)
Calcium: 8.9 mg/dL (ref 8.9–10.3)
Chloride: 96 mmol/L — ABNORMAL LOW (ref 98–111)
Creatinine, Ser: 0.35 mg/dL — ABNORMAL LOW (ref 0.44–1.00)
GFR, Estimated: >60 mL/min (ref >60)
Glucose, Bld: 92 mg/dL (ref 70–99)
Potassium: 3.9 mmol/L (ref 3.5–5.1)
Sodium: 134 mmol/L — ABNORMAL LOW (ref 135–145)
Total Bilirubin: 1.7 mg/dL — ABNORMAL HIGH (ref 0.3–1.2)
Total Protein: 8.1 g/dL (ref 6.5–8.1)

## 2021-01-12 MED ORDER — ESCITALOPRAM OXALATE 10 MG PO TABS
10.0000 mg | ORAL_TABLET | Freq: Every day | ORAL | Status: DC
  Administered 2021-01-13: 10 mg via ORAL
  Filled 2021-01-12 (×2): qty 1

## 2021-01-12 MED ORDER — LIP MEDEX EX OINT
TOPICAL_OINTMENT | CUTANEOUS | Status: AC
  Filled 2021-01-12: qty 7

## 2021-01-12 MED ORDER — FUROSEMIDE 10 MG/ML IJ SOLN
40.0000 mg | Freq: Every day | INTRAMUSCULAR | Status: DC
  Administered 2021-01-12 – 2021-01-13 (×2): 40 mg via INTRAVENOUS
  Filled 2021-01-12 (×2): qty 4

## 2021-01-12 NOTE — Progress Notes (Signed)
   01/12/21 0005  Assess: MEWS Score  Temp 98 F (36.7 C)  BP 135/81  Pulse Rate (!) 118  Resp 16  Level of Consciousness Alert  SpO2 94 %  O2 Device Nasal Cannula  Assess: MEWS Score  MEWS Temp 0  MEWS Systolic 0  MEWS Pulse 2  MEWS RR 0  MEWS LOC 0  MEWS Score 2  MEWS Score Color Yellow  Assess: if the MEWS score is Yellow or Red  Were vital signs taken at a resting state? Yes  Focused Assessment No change from prior assessment  Does the patient meet 2 or more of the SIRS criteria? No  Does the patient have a confirmed or suspected source of infection? No  Provider and Rapid Response Notified? No  MEWS guidelines implemented *See Row Information* Yes  Take Vital Signs  Increase Vital Sign Frequency  Yellow: Q 2hr X 2 then Q 4hr X 2, if remains yellow, continue Q 4hrs  Escalate  MEWS: Escalate Yellow: discuss with charge nurse/RN and consider discussing with provider and RRT  Notify: Charge Nurse/RN  Name of Charge Nurse/RN Notified Vera  Date Charge Nurse/RN Notified 01/12/21  Time Charge Nurse/RN Notified 0005  Notify: Provider  Provider Name/Title Reuel Boom, NP  Date Provider Notified 01/12/21  Time Provider Notified 0030  Notification Type Page  Notification Reason Other (Comment) (MEWs)  Provider response No new orders  Date of Provider Response 01/12/21  Time of Provider Response 0100  Assess: SIRS CRITERIA  SIRS Temperature  0  SIRS Pulse 1  SIRS Respirations  0  SIRS WBC 0  SIRS Score Sum  1

## 2021-01-12 NOTE — Progress Notes (Addendum)
Occupational Therapy Treatment Patient Details Name: Carla Little MRN: [id number] DOB: Jul 13, 1990 Today's Date: 01/12/2021   History of present illness Patient is a 30 y.o. female who presented to [facility] for intractable N/V on 9/17. ED labwork reveals e. coli UTI. Pt had recent hospital admission from 8/8-9/5 due to Lt knee dislocation which was reduced with fractures of the proximal fibula ligamentous avulsion laterally as well as medially off the medial femoral condyle and MRI showed complete ACL & PCL tears and MCL strain. That hospital admission was complicated by nausea, vomiting, tachycardia, UTI, and fecal impaction;. PMH significant for morbid obesity, GERD, fatty liver disease, depression. Patient now with Guillian-Barre syndrome   OT comments  Treatment focused on improving self feeding. Patient required min assist for set up, collecting dropped food and twice to get food in to mouth. Patient exhibited 3 episodes of gagging with chicken in mouth that reduced with smaller pieces. Patient able to use right hand to grasp food and bring in to mouth. Used visual aide (cell phone camera in selfie mode) for patient to be able to see food near mouth to assist with getting food in mouth easier - which worked better. Working on getting patient a Ship broker. Patient did well with small sippy cup with two handles for drinking with minimal spillage. Overall patient able to feed self approx 75% of meal with min assist. She continues to be limited by impaired sensation and global weakness. Needs rest breaks during meal and consistent moving of food pieces so that she can reach. She does best when she can see what she is grasping due to visual compensation for sensory deficits and when she can't has the most difficulty. Cont POC.   Recommendations for follow up therapy are one component of a multi-disciplinary discharge planning process, led by the attending physician.  Recommendations may be updated based on  patient status, additional functional criteria and insurance authorization.    Follow Up Recommendations  SNF    Equipment Recommendations  None recommended by OT    Recommendations for Other Services      Precautions / Restrictions Precautions Precautions: Fall Precaution Comments: Back pain.  go slow with sitting in  somewhat chair position, can slide  down, be sure legs and feet are secure to foot board with folded blankets Required Braces or Orthoses: Knee Immobilizer - Left Other Brace: does not need KI at this time but may consider since opatient has more weakness due GBS Restrictions Weight Bearing Restrictions: No LLE Weight Bearing: Weight bearing as tolerated Other Position/Activity Restrictions: PT Orders from Maureen Ralphs, MD; "WBAT Left LE, only up with a walker, does not need brace at this standpoint"       Mobility Bed Mobility                    Transfers                      Balance                                           ADL either performed or assessed with clinical judgement   ADL Overall ADL's : Needs assistance/impaired Eating/Feeding: Minimal assistance;Bed level;Set up Eating/Feeding Details (indicate cue type and reason): Patient performed feeding task at bed level needing set up. Min assist at times to assist with locating food  when dropped and 2 times to assist with getting food in mouth. patient predominantly used right hand to grasp food - chicken pieces and fries. Patient limtied by sensory deficitsi n bilateral hands and mouth. Slow mastification with 3 episodes of gagging episodes which was reduced by eating smaller pieces of food. therapist provided patient with sippy cup with two handles that patient was able to use with both hands to successfully drink from with minimal spillage. use of cell phone camera to use as a mirror to assist with feeding - patient did better getting food in to mouth with visual aide.                                          Vision Patient Visual Report: No change from baseline     Perception     Praxis      Cognition Arousal/Alertness: Awake/alert Behavior During Therapy: WFL for tasks assessed/performed Overall Cognitive Status: Within Functional Limits for tasks assessed                                          Exercises     Shoulder Instructions       General Comments      Pertinent Vitals/ Pain       Pain Assessment: Faces Faces Pain Scale: Hurts little more Pain Location: L knee Pain Descriptors / Indicators: Discomfort Pain Intervention(s): Monitored during session  Home Living                                          Prior Functioning/Environment              Frequency  Min 2X/week        Progress Toward Goals  OT Goals(current goals can now be found in the care plan section)  Progress towards OT goals: Progressing toward goals  Acute Rehab OT Goals Patient Stated Goal: get better OT Goal Formulation: With patient Time For Goal Achievement: 01/15/21 Potential to Achieve Goals: Fair  Plan Discharge plan remains appropriate    Co-evaluation          OT goals addressed during session: ADL's and self-care      AM-PAC OT "6 Clicks" Daily Activity     Outcome Measure   Help from another person eating meals?: A Little Help from another person taking care of personal grooming?: A Lot Help from another person toileting, which includes using toliet, bedpan, or urinal?: Total Help from another person bathing (including washing, rinsing, drying)?: A Lot Help from another person to put on and taking off regular upper body clothing?: Total Help from another person to put on and taking off regular lower body clothing?: Total 6 Click Score: 10    End of Session    OT Visit Diagnosis: Other abnormalities of gait and mobility (R26.89);History of falling (Z91.81);Pain;Muscle  weakness (generalized) (M62.81)   Activity Tolerance Patient tolerated treatment well   Patient Left in bed;with call bell/phone within reach   Nurse Communication  (monitor patient with feeding)        Time: 9326-7124 OT Time Calculation (min): 65 min  Charges: OT General Charges $OT Visit: 1 Visit OT Treatments $  Self Care/Home Management : 53-67 mins  Waldron Session, OTR/L Acute Care Rehab Services  Office [phone] Pager: [phone]   Kelli Churn 01/12/2021, 3:23 PM

## 2021-01-12 NOTE — Progress Notes (Signed)
NIF (best of 3 attempts) > -40 FVC (best of 3 attempts) 1.4 L

## 2021-01-12 NOTE — Progress Notes (Signed)
Physical Therapy Treatment Patient Details Name: Carla Little MRN: [id number] DOB: 1990-12-20 Today's Date: 01/12/2021   History of Present Illness Patient is a 30 y.o. female who presented to [facility] for intractable N/V on 9/17. ED labwork reveals e. coli UTI. Pt had recent hospital admission from 8/8-9/5 due to Lt knee dislocation which was reduced with fractures of the proximal fibula ligamentous avulsion laterally as well as medially off the medial femoral condyle and MRI showed complete ACL & PCL tears and MCL strain. That hospital admission was complicated by nausea, vomiting, tachycardia, UTI, and fecal impaction;. PMH significant for morbid obesity, GERD, fatty liver disease, depression. Patient now with Guillian-Barre syndrome    PT Comments    Patient motivated to participate in therapy. Patient did participate x  70 minutes in mobility , sitting and NMR exercises.   Patient noted with improved LE activity, especially when sitting more upright. Continues with decreased sensation and proprioception.  Patient placed in tilt position, legs positioned much better, able to get legs closer. Both ankles still tend to invert with increased weight, patient lacks proprioception. Tilted to  standing at 40* x 5 minutes. 37 % weight bwear. Patient  worked on  bilateral knee extensions. Noted to begin to slide down with lack of sufficient knee support so  taken out of standing tilt.  Placed in bed chair position, . Patient able to bring self in forward sitting with min assist and remained  with back unsupported x 35". While sitting, performed strengthening with theraband, trunk forward flexion to reach feet and apply lotion to legs, performed trunk rotations ,neck rotations and shoulder shrugs.  Continue with tilting trials and progress to sitting on bed edge as deemed safe.  Secure chat with Dr. Sula Soda, Harland German and R physician who will assess patient 01/14/21.   Recommendations for follow up  therapy are one component of a multi-disciplinary discharge planning process, led by the attending physician.  Recommendations may be updated based on patient status, additional functional criteria and insurance authorization.  Follow Up Recommendations  SNF     Equipment Recommendations  Rolling walker with 5" wheels    Recommendations for Other Services       Precautions / Restrictions Precautions Precautions: Fall Precaution Comments: LE's remain very weak, not supporting in tilt to stand. Required Braces or Orthoses: Knee Immobilizer - Left Other Brace: does not need KI at this time but may consider since opatient has more weakness due GBS Restrictions Weight Bearing Restrictions: No LLE Weight Bearing: Weight bearing as tolerated Other Position/Activity Restrictions: PT Orders from Maureen Ralphs, MD; "WBAT Left LE, only up with a walker, does not need brace at this standpoint"     Mobility  Bed Mobility   Bed Mobility: Rolling Rolling: Max assist;+2 for physical assistance;+2 for safety/equipment              Transfers                    Ambulation/Gait                 Stairs             Wheelchair Mobility    Modified Rankin (Stroke Patients Only)       Balance Overall balance assessment: Needs assistance Sitting-balance support: No upper extremity supported (in bed chair position) Sitting balance-Leahy Scale: Fair (in bed chair position) Sitting balance - Comments: once patient leaned forward, HOB dropped. Patient sat freely x 35".  Patient able to rotate trunk, lean forward and raise back up to stretch back.                                    Cognition Arousal/Alertness: Awake/alert Behavior During Therapy: Flat affect Overall Cognitive Status: Within Functional Limits for tasks assessed                                 General Comments: much perkier and interactive and conversive. States'I am trying  hard and I am going to beat this."      Exercises General Exercises - Upper Extremity Shoulder Flexion: AAROM;Both;10 reps;Seated Shoulder Extension: Strengthening;10 reps;Seated;Theraband Shoulder ABduction: AAROM;10 reps;Both Elbow Flexion: Strengthening;Both;10 reps;Seated;Theraband Elbow Extension: Strengthening;Both;10 reps;Seated;Theraband Other Exercises Other Exercises: trunk rotation x 5 to each side and forward stretch, reaching UE's forward toward feet. maintained x 1 minute intervals x 5.    General Comments        Pertinent Vitals/Pain Pain Assessment: No/denies pain Faces Pain Scale: Hurts little more Pain Location: L knee Pain Descriptors / Indicators: Discomfort Pain Intervention(s): Monitored during session    Home Living                      Prior Function            PT Goals (current goals can now be found in the care plan section) Acute Rehab PT Goals Patient Stated Goal: get better Progress towards PT goals: Progressing toward goals    Frequency    Min 2X/week      PT Plan Current plan remains appropriate    Co-evaluation       OT goals addressed during session: ADL's and self-care      AM-PAC PT "6 Clicks" Mobility   Outcome Measure  Help needed turning from your back to your side while in a flat bed without using bedrails?: Total Help needed moving from lying on your back to sitting on the side of a flat bed without using bedrails?: Total Help needed moving to and from a bed to a chair (including a wheelchair)?: Total Help needed standing up from a chair using your arms (e.g., wheelchair or bedside chair)?: Total Help needed to walk in hospital room?: Total Help needed climbing 3-5 steps with a railing? : Total 6 Click Score: 6    End of Session   Activity Tolerance: Patient tolerated treatment well Patient left: in bed;with call bell/phone within reach Nurse Communication: Mobility status;Need for lift equipment PT  Visit Diagnosis: Muscle weakness (generalized) (M62.81);Other symptoms and signs involving the nervous system (R29.898)     Time: 9562-1308 PT Time Calculation (min) (ACUTE ONLY): 83 min  Charges:  $Therapeutic Activity: 23-37 mins $Neuromuscular Re-education: 38-52 mins                    Blanchard Kelch PT Acute Rehabilitation Services Pager [phone] Office [phone]   Rada Hay 01/12/2021, 4:40 PM

## 2021-01-12 NOTE — Progress Notes (Signed)
Notified NP Garner Nash about Patients' tachy heart rate during the night.  He stated that it's been like like and there's note referring to it so just monitor it.

## 2021-01-12 NOTE — Progress Notes (Signed)
Chaplain did follow-up visit with patient.  It was her birthday two days ago.  This chaplain tried to visit then but she was sleeping. Today she was wide awake and had another visitor who had just left the room.  The patient reported with sincere joy that the hospital staff had helped her to fully celebrate (after she had her colonscopy)   She named all the gifts she had received as well as cake, cards and balloons. When asked what she wanted most for her birthday, she replied "I want to walk...but I know that will take time." She talked about the fall she took at [facility] in September that led to her hospitalizations and declining health.  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   Chaplain asked about the persons named on her FaceSheet.  She explained that none of the three listed could come to see her. Bea Graff (an older woman) is a family friend who has difficulty walking. Elmarie Shiley Rodman Pickle is a friend who works two jobs to support herself. Prentiss Bells a cousin, lives out of state.  She is NOK.  Chaplain continued to listen.  Patient loves the visitor -Rhonda--who's  staff in Pathology who comes to visit her when she has a free moment.  All these visits are very meaningful to the patient because otherwise she would feel isolated.  Speaking of her birthday party "I haven't felt that much love in a long, long, long time. Chaplain will continue to visit when she's at [facility]  Rev. Lynnell Chad Pager [phone]

## 2021-01-12 NOTE — Progress Notes (Signed)
PROGRESS NOTE    Carla Little  ZJI:[id number] DOB: Nov 16, 1990 DOA: 12/09/2020 PCP: Patient, No Pcp Per (Inactive)   Brief Narrative: Carla Little is a 30 y.o. female with a history of morbid obesity, recent left knee dislocation. Patient presented secondary to weakness with presentation concerning for Guillain Barre syndrome. Neurology consulted and patient started on IVIG. She was also found to have vitamin B12 and folate deficiencies. Patient is now awaiting rehab options for discharge.  Developed nausea, vomiting, rectal pain/abnormality on CT, [facility] consulted and s/p colonoscopy and EGD 10/18.  Multifactorial anasarca, treating with IV Lasix.   Assessment & Plan:   Principal Problem:   Intractable vomiting with nausea Active Problems:   Left knee dislocation   PUD (peptic ulcer disease)   Nonalcoholic steatohepatitis (NASH)   Class 3 obesity (HCC)   Sinus tachycardia   Hypokalemia   GERD (gastroesophageal reflux disease)   Iron deficiency anemia due to chronic blood loss   E. coli UTI   Guillain Barr syndrome (HCC)   Guillain Barre syndrome Neuropathy Neurology consulted and completed IVIG x5 days as per recommendations. Also recommended treating confounding diagnoses of vitamin B12 and folate deficiency.  -Increased to Lyrica 75 mg TID -Neurology (signed off 10/2): Vitamin B12 qweek for 4 weeks, then monthly Vitamin B12 injections. - Consider outpatient follow-up with neurology. - As per PT input on 10/20, Dr. Alease Frame from [facility] has been consulted and is planning to see her on 10/21.  Patient is not a good CIR candidate due to lack of support.  Dr. Dalene Carrow can follow-up outpatient.  Intractable nausea and vomiting GI evaluated, signed off.  Now resolved. [name] consulted, s/p EGD 10/18 without any acute abnormalities noted. - Diet as tolerated.  Rectal inflammation Seen on CT imaging. Rectal thickening extending into rectosigmoid junction.  Patient does mention pain with having bowel movements/wiping but otherwise did not have symptoms. She reports history of no anal related sexual activity. No systemic symptoms concerning for infection. -GI consulted. Anusol suppositories, s/p flexible sigmoidoscopy which was not an adequate prep but did not show any significant abnormalities.  May need a repeat study as outpatient.  I discussed with Dr.Brahmbhatt, [facility] on 10/19 and he suggested that there was no need for repeating the sigmoidoscopy unless she develops rectal bleeding.  He did not see any major lesions.  Depression/anxiety Evaluated by psychiatry -Continue Buspar 5 mg BID -Continue Remeron 30 mg qHS due to lack of response; titrate up q2 weeks as needed/tolerated - Given her very morbid obesity, risk of weight gain with Remeron-discontinued, start her on Lexapro 10 Mg daily.  Shortness of breath Transient. Resolved.  Left knee dislocation Patient previously evaluated by orthopedic surgery with recommendation for hinge brace and to allow for joint scarring. Currently weight bearing as tolerated with limited flexion of left knee.  Sinus tachycardia This has been managed with metoprolol. Unsure of etiology. Possibly related to deconditioning. No concerning symptoms.  Iron deficiency anemia Related to heavy menstruation. Hemoglobin stable in the 9 g range..  Elevated AST/ALT NAFLD Hyperbilirubinemia AST/ALT with initial trend upwards. No associated symptoms. Abdominal ultrasound (10/11) significant for fatty liver disease and cholelithiasis without cholecystitis. INR normal. Bilirubin stable and AST/ALT now stable but elevated. GI consulted. -AST and ALT stable.  Periodic follow-up with labs and follow-up as outpatient.  B12 deficiency Folate deficiency B12 and folate of 153 and 3.2 respectively. Patient started on Vitamin B12 and folic acid supplementation  E. Coli UTI  Completed treatment with Ceftriaxone and  transitioned to Cefazolin.  Overnight hypoxia Likely patient has OSA and/or OHS judging by symptoms and body habitus. Currently managed with oxygen overnight while asleep. CT abdomen/pelvis significant for evidence of hypoventilation. Attempted to trial CPAP but patient declined treatment. Could consider nocturnal oxygen saturation monitoring.  Hypocalcemia Mild and likely related to hypoalbuminemia.  Hypokalemia -Potassium supplementation as needed  LE edema/anasarca Possibly related to hypoalbuminemia. No evidence of kidney or heart impairment. Liver does have some disease; normal bilirubin. No ascites on ultrasound. Significant bilateral upper extremity pitting edema.  DC IV fluids.  Initiation of IV Lasix 40 mg daily for a couple days, will dose on a daily basis.  Reportedly had good urine output.  Edema is slightly better but still had quite alkalotic.  Bilateral leg pain Low suspicion for acute DVT, however pain is apparently new onset. Difficult to assess from examination secondary to body habitus. Venous duplex was negative for acute DVT, bilaterally.  Possible oral candidiasis Treated with Diflucan IV.  Resolved.  Positive blood culture result 1/4 samples significant for staphylococcus epidermidis. Unlikely active infection.  Morbid obesity Body mass index is 86.28 kg/m. Dietitian consulted. -Dietitian recommendations (10/12): Liberalize diet to regular to promote oral intake Will request food be chopped into bite sized pieces and dining services alert nursing staff when tray is delivered Nursing staff to assist with set-up and feeeding of meals Increase Ensure Enlive po to TID, each supplement provides 350 kcal and 20 grams of protein Continue MVI regimen   DVT prophylaxis: Lovenox Code Status:   Code Status: Full Code Family Communication: None at bedside Disposition Plan: DC to SNF pending bed.  Discussed with TOC during morning rounds.   Consultants:   Neurology Psychiatry Gastroenterology  Procedures:  None  Antimicrobials: Ceftriaxone Ancef Diflucan Flagyl    Subjective: Denies complaints.  No dyspnea.  Reports that her swelling of hands is somewhat better.  Urinated well.  Objective: Vitals:   01/12/21 0342 01/12/21 0500 01/12/21 0600 01/12/21 0743  BP: (!) 135/49   127/75  Pulse: (!) 122 (!) 117  (!) 113  Resp: 17   16  Temp: 99 F (37.2 C)   98.1 F (36.7 C)  TempSrc:    Oral  SpO2: 92%   100%  Weight:   (!) 228 kg   Height:        Intake/Output Summary (Last 24 hours) at 01/12/2021 1115 Last data filed at 01/12/2021 0700 Gross per 24 hour  Intake 636 ml  Output 2350 ml  Net -1714 ml   Filed Weights   01/10/21 1254 01/11/21 0447 01/12/21 0600  Weight: (!) 198.8 kg (!) 197.3 kg (!) 228 kg    Examination:  General exam: Pleasant young female, very morbidly obese, lying comfortably propped up in bed without distress. Respiratory system: Clear to auscultation.  No increased work of breathing. Cardiovascular system: S1 & S2 heard, RRR. No murmurs, rubs, gallops or clicks.  Heart to clearly appreciate JVD and lower extremity edema due to her body habitus.  Definitely has bilateral upper extremity pitting edema, marginally better compared to yesterday. Gastrointestinal system: Abdomen is nondistended, soft and nontender. No organomegaly or masses felt. Normal bowel sounds heard. Central nervous system: Alert and oriented. No focal neurological deficits. Musculoskeletal: Moves all extremities symmetrically Skin: No cyanosis. No rashes Psychiatry: Judgement and insight appear normal.  Data Reviewed: I have personally reviewed following labs and imaging studies  CBC Lab Results  Component Value Date  WBC 8.8 01/11/2021   RBC 3.05 (L) 01/11/2021   HGB 9.0 (L) 01/11/2021   HCT 30.3 (L) 01/11/2021   MCV 99.3 01/11/2021   MCH 29.5 01/11/2021   PLT 491 (H) 01/11/2021   MCHC 29.7 (L) 01/11/2021   RDW 20.5  (H) 01/11/2021   LYMPHSABS 2.0 01/03/2021   MONOABS 0.9 01/03/2021   EOSABS 0.0 01/03/2021   BASOSABS 0.0 01/03/2021     Last metabolic panel Lab Results  Component Value Date   NA 134 (L) 01/12/2021   K 3.9 01/12/2021   CL 96 (L) 01/12/2021   CO2 29 01/12/2021   BUN 6 01/12/2021   CREATININE 0.35 (L) 01/12/2021   GLUCOSE 92 01/12/2021   GFRNONAA >60 01/12/2021   GFRAA >60 05/16/2018   CALCIUM 8.9 01/12/2021   PHOS 2.9 12/14/2020   PROT 8.1 01/12/2021   ALBUMIN 2.9 (L) 01/12/2021   BILITOT 1.7 (H) 01/12/2021   ALKPHOS 76 01/12/2021   AST 360 (H) 01/12/2021   ALT 127 (H) 01/12/2021   ANIONGAP 9 01/12/2021    CBG (last 3)  No results for input(s): GLUCAP in the last 72 hours.   GFR: Estimated Creatinine Clearance: 197.6 mL/min (A) (by C-G formula based on SCr of 0.35 mg/dL (L)).  Coagulation Profile: No results for input(s): INR, PROTIME in the last 168 hours.   Recent Results (from the past 240 hour(s))  Resp Panel by RT-PCR (Flu A&B, Covid) Nasopharyngeal Swab     Status: None   Collection Time: 01/06/21 10:09 AM   Specimen: Nasopharyngeal Swab; Nasopharyngeal(NP) swabs in vial transport medium  Result Value Ref Range Status   SARS Coronavirus 2 by RT PCR NEGATIVE NEGATIVE Final    Comment: (NOTE) SARS-CoV-2 target nucleic acids are NOT DETECTED.  The SARS-CoV-2 RNA is generally detectable in upper respiratory specimens during the acute phase of infection. The lowest concentration of SARS-CoV-2 viral copies this assay can detect is 138 copies/mL. A negative result does not preclude SARS-Cov-2 infection and should not be used as the sole basis for treatment or other patient management decisions. A negative result may occur with  improper specimen collection/handling, submission of specimen other than nasopharyngeal swab, presence of viral mutation(s) within the areas targeted by this assay, and inadequate number of viral copies(<138 copies/mL). A negative  result must be combined with clinical observations, patient history, and epidemiological information. The expected result is Negative.  Fact Sheet for Patients:  BloggerCourse.com  Fact Sheet for Healthcare Providers:  SeriousBroker.it  This test is no t yet approved or cleared by the [place] FDA and  has been authorized for detection and/or diagnosis of SARS-CoV-2 by FDA under an Emergency Use Authorization (EUA). This EUA will remain  in effect (meaning this test can be used) for the duration of the COVID-19 declaration under Section 564(b)(1) of the Act, 21 U.S.C.section 360bbb-3(b)(1), unless the authorization is terminated  or revoked sooner.       Influenza A by PCR NEGATIVE NEGATIVE Final   Influenza B by PCR NEGATIVE NEGATIVE Final    Comment: (NOTE) The Xpert Xpress SARS-CoV-2/FLU/RSV plus assay is intended as an aid in the diagnosis of influenza from Nasopharyngeal swab specimens and should not be used as a sole basis for treatment. Nasal washings and aspirates are unacceptable for Xpert Xpress SARS-CoV-2/FLU/RSV testing.  Fact Sheet for Patients: BloggerCourse.com  Fact Sheet for Healthcare Providers: SeriousBroker.it  This test is not yet approved or cleared by the [place] and has been authorized for  detection and/or diagnosis of SARS-CoV-2 by FDA under an Emergency Use Authorization (EUA). This EUA will remain in effect (meaning this test can be used) for the duration of the COVID-19 declaration under Section 564(b)(1) of the Act, 21 U.S.C. section 360bbb-3(b)(1), unless the authorization is terminated or revoked.  Performed at [facility], [address], [place], [place] [address]         Radiology Studies: No results found.      Scheduled Meds:  busPIRone  5 mg Oral BID   cyanocobalamin  1,000 mcg  Intramuscular Weekly   enoxaparin (LOVENOX) injection  50 mg Subcutaneous Q12H   feeding supplement  237 mL Oral TID BM   folic acid  1 mg Oral Daily   hydrocortisone  25 mg Rectal BID   lidocaine  1 patch Transdermal Daily   magic mouthwash w/lidocaine  5 mL Oral TID AC   metoprolol tartrate  25 mg Oral BID   mirtazapine  30 mg Oral QHS   multivitamin with minerals  1 tablet Oral Daily   pantoprazole  40 mg Oral BID   polyethylene glycol  17 g Oral Daily   pregabalin  75 mg Oral TID   senna-docusate  1 tablet Oral BID   sodium chloride flush  3 mL Intravenous Q12H   Continuous Infusions:  sodium chloride 10 mL/hr at 01/07/21 1710     LOS: 33 days    Marcellus Scott, MD, [place], [facility]. [facility]  To contact the attending provider between 7A-7P or the covering provider during after hours 7P-7A, please log into the web site [email or web address] and access using universal [place] password for that web site. If you do not have the password, please call the hospital operator.

## 2021-01-13 LAB — BASIC METABOLIC PANEL
Anion gap: 8 (ref 5–15)
BUN: 6 mg/dL (ref 6–20)
CO2: 32 mmol/L (ref 22–32)
Calcium: 8.8 mg/dL — ABNORMAL LOW (ref 8.9–10.3)
Chloride: 93 mmol/L — ABNORMAL LOW (ref 98–111)
Creatinine, Ser: 0.35 mg/dL — ABNORMAL LOW (ref 0.44–1.00)
GFR, Estimated: >60 mL/min (ref >60)
Glucose, Bld: 101 mg/dL — ABNORMAL HIGH (ref 70–99)
Potassium: 3.5 mmol/L (ref 3.5–5.1)
Sodium: 133 mmol/L — ABNORMAL LOW (ref 135–145)

## 2021-01-13 MED ORDER — POTASSIUM CHLORIDE CRYS ER 20 MEQ PO TBCR
40.0000 meq | EXTENDED_RELEASE_TABLET | Freq: Once | ORAL | Status: AC
  Administered 2021-01-13: 40 meq via ORAL
  Filled 2021-01-13: qty 2

## 2021-01-13 NOTE — Progress Notes (Signed)
Physical Therapy Treatment Patient Details Name: Carla Little MRN: [id number] DOB: 08-20-1990 Today's Date: 01/13/2021   History of Present Illness Patient is a 30 y.o. female who presented to [facility] for intractable N/V on 9/17. ED labwork reveals e. coli UTI. Pt had recent hospital admission from 8/8-9/5 due to Lt knee dislocation which was reduced with fractures of the proximal fibula ligamentous avulsion laterally as well as medially off the medial femoral condyle and MRI showed complete ACL & PCL tears and MCL strain. That hospital admission was complicated by nausea, vomiting, tachycardia, UTI, and fecal impaction;. PMH significant for morbid obesity, GERD, fatty liver disease, depression. Patient now with Guillian-Barre syndrome    PT Comments    Patient reports feeling sore all over, " I am going to work through it." . Patient placed in sitting position in bed. Assisted patient  to move legs tobed edge and over to support in  the recliner. Patient able to remain sitting  upright with close min guard no back support. support at back x 20 minutes. Able to stretch and reach  down towards feet x 10. HR 114.  Assisted legs and trunk back  to supine.  Continue stand/tilting opportunities sitting  bedside/bed chair and  NMR exercises.   Recommendations for follow up therapy are one component of a multi-disciplinary discharge planning process, led by the attending physician.  Recommendations may be updated based on patient status, additional functional criteria and insurance authorization.  Follow Up Recommendations  SNF     Equipment Recommendations  Wheelchair (measurements PT);Wheelchair cushion (measurements PT)    Recommendations for Other Services       Precautions / Restrictions Precautions Precautions: Fall Precaution Comments: LE's remain very weak, not supporting in tilt to stand. Other Brace: does not need KI at this time but may consider since opatient has more weakness  due GBS     Mobility  Bed Mobility   Bed Mobility: Rolling Rolling: Max assist;+2 for physical assistance;+2 for safety/equipment         General bed mobility comments: Placed patient in seated upright bed position. with 2 persons moving legs and 1 at trunk, assisted moving legs ariound  and over bed edge, using bed pads to  scoot around. Feet placed in recliner for support. Patient sat  x 20 minutes without balance loss, did  not x 1 list posteriorly. + 3 to return to supine and reposition.    Transfers                    Ambulation/Gait                 Stairs             Wheelchair Mobility    Modified Rankin (Stroke Patients Only)       Balance Overall balance assessment: Needs assistance Sitting-balance support: No upper extremity supported Sitting balance-Leahy Scale: Fair Sitting balance - Comments: once seated on bed edge, sat x 20 minutes rocking trunk to sides. leaning forward toward feet and able to sit back upright.                                    Cognition Arousal/Alertness: Awake/alert Behavior During Therapy: Flat affect  General Comments: not as  perky, states she feels sore all over but will push through it.      Exercises      General Comments        Pertinent Vitals/Pain Faces Pain Scale: Hurts even more Pain Location: all over" I haven't had my meds since 430. Pain Descriptors / Indicators: Discomfort;Grimacing Pain Intervention(s): Monitored during session;Patient requesting pain meds-RN notified    Home Living                      Prior Function            PT Goals (current goals can now be found in the care plan section) Progress towards PT goals: Progressing toward goals    Frequency    Min 2X/week      PT Plan Current plan remains appropriate    Co-evaluation              AM-PAC PT "6 Clicks" Mobility   Outcome  Measure  Help needed turning from your back to your side while in a flat bed without using bedrails?: Total Help needed moving from lying on your back to sitting on the side of a flat bed without using bedrails?: Total Help needed moving to and from a bed to a chair (including a wheelchair)?: Total Help needed standing up from a chair using your arms (e.g., wheelchair or bedside chair)?: Total Help needed to walk in hospital room?: Total Help needed climbing 3-5 steps with a railing? : Total 6 Click Score: 6    End of Session   Activity Tolerance: Patient tolerated treatment well Patient left: in bed;with call bell/phone within reach Nurse Communication: Mobility status;Need for lift equipment PT Visit Diagnosis: Muscle weakness (generalized) (M62.81);Other symptoms and signs involving the nervous system (R29.898) Pain - part of body: Shoulder     Time: 8295-6213 PT Time Calculation (min) (ACUTE ONLY): 53 min  Charges:  $Therapeutic Activity: 53-67 mins                     Blanchard Kelch PT Acute Rehabilitation Services Pager [phone] Office [phone]    Rada Hay 01/13/2021, 4:38 PM

## 2021-01-13 NOTE — Progress Notes (Signed)
RT NOTE:  VC 1.5L NIF -40  Good pt effort.

## 2021-01-13 NOTE — Progress Notes (Signed)
PROGRESS NOTE    Carla Little  ACZ:[id number] DOB: 01/11/1991 DOA: 12/09/2020 PCP: Patient, No Pcp Per (Inactive)   Brief Narrative: Carla Little is a 30 y.o. female with a history of morbid obesity, recent left knee dislocation. Patient presented secondary to weakness with presentation concerning for Guillain Barre syndrome. Neurology consulted and patient started on IVIG. She was also found to have vitamin B12 and folate deficiencies. Patient is now awaiting rehab options for discharge.  Developed nausea, vomiting, rectal pain/abnormality on CT, [facility] consulted and s/p colonoscopy and EGD 10/18.  Multifactorial anasarca, treating with IV Lasix.   Assessment & Plan:   Principal Problem:   Intractable vomiting with nausea Active Problems:   Left knee dislocation   PUD (peptic ulcer disease)   Nonalcoholic steatohepatitis (NASH)   Class 3 obesity (HCC)   Sinus tachycardia   Hypokalemia   GERD (gastroesophageal reflux disease)   Iron deficiency anemia due to chronic blood loss   E. coli UTI   Guillain Barr syndrome (HCC)   Guillain Barre syndrome Neuropathy Neurology consulted and completed IVIG x5 days as per recommendations. Also recommended treating confounding diagnoses of vitamin B12 and folate deficiency.  -Increased to Lyrica 75 mg TID -Neurology (signed off 10/2): Vitamin B12 qweek for 4 weeks, then monthly Vitamin B12 injections. - Consider outpatient follow-up with neurology. - As per PT input on 10/20, Dr. Alease Frame from [facility] has been consulted and is planning to see her on 10/22.  Patient is not a good CIR candidate due to lack of support.  Dr. Dalene Carrow can follow-up outpatient. -As per nursing, PT input and mild observation, patient is very motivated to get better and has been participating in therapies and making gradual progress.  Reportedly was able to set up at edge of bed along with therapy/nursing today.  Seen exercising with the resistant  bands with her hands this morning.  Intractable nausea and vomiting Now resolved. [name] consulted, s/p EGD 10/18 without any acute abnormalities noted. - Diet as tolerated.  Rectal inflammation Seen on CT imaging. Rectal thickening extending into rectosigmoid junction. Patient does mention pain with having bowel movements/wiping but otherwise did not have symptoms. She reports history of no anal related sexual activity. No systemic symptoms concerning for infection. -GI consulted. Anusol suppositories, s/p flexible sigmoidoscopy which was not an adequate prep but did not show any significant abnormalities.  May need a repeat study as outpatient.  I discussed with Dr.Brahmbhatt, [facility] on 10/19 and he suggested that there was no need for repeating the sigmoidoscopy unless she develops rectal bleeding.  He did not see any major lesions.  Depression/anxiety Evaluated by psychiatry -Continue Buspar 5 mg BID - Given her very morbid obesity, risk of weight gain with Remeron-discontinued, start her on Lexapro 10 Mg daily.  Also Remeron was not helping with her decreased appetite. - Patient initially reluctant to start Lexapro.  Reports that she had been on BuSpar, Lexapro and as needed Vistaril in the past and Lexapro made her feel like an "zombie".  Also stated that Lexapro was making her eat a lot. - Willing to retry Lexapro again. - We will also get psychiatry input regarding better management of her depression and anxiety.  Shortness of breath Transient. Resolved.  Left knee dislocation Patient previously evaluated by orthopedic surgery with recommendation for hinge brace and to allow for joint scarring. Currently weight bearing as tolerated with limited flexion of left knee.  Sinus tachycardia This has been  managed with metoprolol. Unsure of etiology. Possibly related to deconditioning. No concerning symptoms.  Iron deficiency anemia Related to heavy menstruation. Hemoglobin stable  in the 9 g range..  Elevated AST/ALT NAFLD Hyperbilirubinemia AST/ALT with initial trend upwards. No associated symptoms. Abdominal ultrasound (10/11) significant for fatty liver disease and cholelithiasis without cholecystitis. INR normal. Bilirubin stable and AST/ALT now stable but elevated. GI consulted. -AST and ALT stable.  Periodic follow-up with labs and follow-up as outpatient.  B12 deficiency Folate deficiency B12 and folate of 153 and 3.2 respectively. Patient started on Vitamin B12 and folic acid supplementation  E. Coli UTI Completed treatment with Ceftriaxone and transitioned to Cefazolin.  Overnight hypoxia Likely patient has OSA and/or OHS judging by symptoms and body habitus. Currently managed with oxygen overnight while asleep. CT abdomen/pelvis significant for evidence of hypoventilation. Attempted to trial CPAP but patient declined treatment. Could consider nocturnal oxygen saturation monitoring.  Hypocalcemia Mild and likely related to hypoalbuminemia.  Hypokalemia -Potassium supplementation as needed  LE edema/anasarca Possibly related to hypoalbuminemia. No evidence of kidney or heart impairment. Liver does have some disease; normal bilirubin. No ascites on ultrasound. Significant bilateral upper extremity pitting edema.  DC IV fluids. - Still has significant third spacing/anasarca which is gradually improving.  Continue IV Lasix 40 mg daily as long as BMP remains okay.  Bilateral leg pain Low suspicion for acute DVT, however pain is apparently new onset. Difficult to assess from examination secondary to body habitus. Venous duplex was negative for acute DVT, bilaterally.  Possible oral candidiasis Treated with Diflucan IV.  Resolved.  Positive blood culture result 1/4 samples significant for staphylococcus epidermidis. Unlikely active infection.  Morbid obesity Body mass index is 72.43 kg/m. Dietitian consulted. -Dietitian recommendations  (10/12): Liberalize diet to regular to promote oral intake Will request food be chopped into bite sized pieces and dining services alert nursing staff when tray is delivered Nursing staff to assist with set-up and feeeding of meals Increase Ensure Enlive po to TID, each supplement provides 350 kcal and 20 grams of protein Continue MVI regimen   DVT prophylaxis: Lovenox Code Status:   Code Status: Full Code Family Communication: None at bedside Disposition Plan: DC to SNF pending bed.  Discussed with TOC during morning rounds.   Consultants:  Neurology Psychiatry Gastroenterology  Procedures:  None  Antimicrobials: Ceftriaxone Ancef Diflucan Flagyl    Subjective: Hand and leg swelling improving and wants to continue Lasix.  Motivated to continue physical therapy.  Denies any other complaints.  Rest of med history as noted above today.  Objective: Vitals:   01/12/21 1132 01/12/21 2100 01/13/21 0506 01/13/21 0543  BP: 123/84 126/72  131/89  Pulse: (!) 105 100  (!) 102  Resp: 16 18  20   Temp: 98.2 F (36.8 C) 98.4 F (36.9 C)  98.9 F (37.2 C)  TempSrc: Oral Oral  Oral  SpO2: 95% 100%  95%  Weight:   (!) 191.5 kg   Height:   5' 4.02" (1.626 m)    No intake or output data in the 24 hours ending 01/13/21 1432  Filed Weights   01/11/21 0447 01/12/21 0600 01/13/21 0506  Weight: (!) 197.3 kg (!) 228 kg (!) 191.5 kg    Examination:  General exam: Pleasant young female, very morbidly obese, lying comfortably propped up in bed without distress.  Working on resistant bands with both her hands. Respiratory system: Clear to auscultation.  No increased work of breathing. Cardiovascular system: S1 & S2 heard, RRR. No  murmurs, rubs, gallops or clicks.  Heart to clearly appreciate JVD and lower extremity edema due to her body habitus.  Still with significant bilateral upper extremity/especially dorsum of hand pitting edema and some leg edema.   Gastrointestinal system: Abdomen  is nondistended, soft and nontender. No organomegaly or masses felt. Normal bowel sounds heard. Central nervous system: Alert and oriented. No focal neurological deficits. Musculoskeletal: Moves all extremities symmetrically Skin: No cyanosis. No rashes Psychiatry: Judgement and insight appear normal.  Data Reviewed: I have personally reviewed following labs and imaging studies  CBC Lab Results  Component Value Date   WBC 8.8 01/11/2021   RBC 3.05 (L) 01/11/2021   HGB 9.0 (L) 01/11/2021   HCT 30.3 (L) 01/11/2021   MCV 99.3 01/11/2021   MCH 29.5 01/11/2021   PLT 491 (H) 01/11/2021   MCHC 29.7 (L) 01/11/2021   RDW 20.5 (H) 01/11/2021   LYMPHSABS 2.0 01/03/2021   MONOABS 0.9 01/03/2021   EOSABS 0.0 01/03/2021   BASOSABS 0.0 01/03/2021     Last metabolic panel Lab Results  Component Value Date   NA 133 (L) 01/13/2021   K 3.5 01/13/2021   CL 93 (L) 01/13/2021   CO2 32 01/13/2021   BUN 6 01/13/2021   CREATININE 0.35 (L) 01/13/2021   GLUCOSE 101 (H) 01/13/2021   GFRNONAA >60 01/13/2021   GFRAA >60 05/16/2018   CALCIUM 8.8 (L) 01/13/2021   PHOS 2.9 12/14/2020   PROT 8.1 01/12/2021   ALBUMIN 2.9 (L) 01/12/2021   BILITOT 1.7 (H) 01/12/2021   ALKPHOS 76 01/12/2021   AST 360 (H) 01/12/2021   ALT 127 (H) 01/12/2021   ANIONGAP 8 01/13/2021    CBG (last 3)  No results for input(s): GLUCAP in the last 72 hours.   GFR: Estimated Creatinine Clearance: 174.4 mL/min (A) (by C-G formula based on SCr of 0.35 mg/dL (L)).  Coagulation Profile: No results for input(s): INR, PROTIME in the last 168 hours.   Recent Results (from the past 240 hour(s))  Resp Panel by RT-PCR (Flu A&B, Covid) Nasopharyngeal Swab     Status: None   Collection Time: 01/06/21 10:09 AM   Specimen: Nasopharyngeal Swab; Nasopharyngeal(NP) swabs in vial transport medium  Result Value Ref Range Status   SARS Coronavirus 2 by RT PCR NEGATIVE NEGATIVE Final    Comment: (NOTE) SARS-CoV-2 target nucleic acids  are NOT DETECTED.  The SARS-CoV-2 RNA is generally detectable in upper respiratory specimens during the acute phase of infection. The lowest concentration of SARS-CoV-2 viral copies this assay can detect is 138 copies/mL. A negative result does not preclude SARS-Cov-2 infection and should not be used as the sole basis for treatment or other patient management decisions. A negative result may occur with  improper specimen collection/handling, submission of specimen other than nasopharyngeal swab, presence of viral mutation(s) within the areas targeted by this assay, and inadequate number of viral copies(<138 copies/mL). A negative result must be combined with clinical observations, patient history, and epidemiological information. The expected result is Negative.  Fact Sheet for Patients:  BloggerCourse.com  Fact Sheet for Healthcare Providers:  SeriousBroker.it  This test is no t yet approved or cleared by the [place] FDA and  has been authorized for detection and/or diagnosis of SARS-CoV-2 by FDA under an Emergency Use Authorization (EUA). This EUA will remain  in effect (meaning this test can be used) for the duration of the COVID-19 declaration under Section 564(b)(1) of the Act, 21 U.S.C.section 360bbb-3(b)(1), unless the authorization is terminated  or revoked sooner.       Influenza A by PCR NEGATIVE NEGATIVE Final   Influenza B by PCR NEGATIVE NEGATIVE Final    Comment: (NOTE) The Xpert Xpress SARS-CoV-2/FLU/RSV plus assay is intended as an aid in the diagnosis of influenza from Nasopharyngeal swab specimens and should not be used as a sole basis for treatment. Nasal washings and aspirates are unacceptable for Xpert Xpress SARS-CoV-2/FLU/RSV testing.  Fact Sheet for Patients: BloggerCourse.com  Fact Sheet for Healthcare Providers: SeriousBroker.it  This test is  not yet approved or cleared by the [place] FDA and has been authorized for detection and/or diagnosis of SARS-CoV-2 by FDA under an Emergency Use Authorization (EUA). This EUA will remain in effect (meaning this test can be used) for the duration of the COVID-19 declaration under Section 564(b)(1) of the Act, 21 U.S.C. section 360bbb-3(b)(1), unless the authorization is terminated or revoked.  Performed at [facility], [address], [place], [place] [address]         Radiology Studies: No results found.      Scheduled Meds:  busPIRone  5 mg Oral BID   cyanocobalamin  1,000 mcg Intramuscular Weekly   enoxaparin (LOVENOX) injection  50 mg Subcutaneous Q12H   escitalopram  10 mg Oral Daily   feeding supplement  237 mL Oral TID BM   folic acid  1 mg Oral Daily   furosemide  40 mg Intravenous Daily   hydrocortisone  25 mg Rectal BID   lidocaine  1 patch Transdermal Daily   magic mouthwash w/lidocaine  5 mL Oral TID AC   metoprolol tartrate  25 mg Oral BID   multivitamin with minerals  1 tablet Oral Daily   pantoprazole  40 mg Oral BID   polyethylene glycol  17 g Oral Daily   pregabalin  75 mg Oral TID   senna-docusate  1 tablet Oral BID   sodium chloride flush  3 mL Intravenous Q12H   Continuous Infusions:  sodium chloride 10 mL/hr at 01/07/21 1710     LOS: 34 days    Marcellus Scott, MD, [place], [facility]. [facility]  To contact the attending provider between 7A-7P or the covering provider during after hours 7P-7A, please log into the web site [email or web address] and access using universal [place] password for that web site. If you do not have the password, please call the hospital operator.

## 2021-01-13 NOTE — Progress Notes (Signed)
Pt. Unable to tolerate weaning off od oxygen. O2 sat dropped to 80. Placed back on 2L.

## 2021-01-13 NOTE — Progress Notes (Signed)
Overnight pulse oximetry study started.  Pt on room air with vital signs charted.

## 2021-01-13 NOTE — TOC Progression Note (Signed)
Transition of Care [facility]) - Progression Note    Patient Details  Name: Carla Little MRN: [id number] Date of Birth: Nov 01, 1990  [facility] CM/SW Contact  Ida Rogue, [place] Phone Number: 01/13/2021, 12:43 PM  Clinical Narrative:   Spoke with patient, who updated me on financial needs, and let me know that she had her phone disability interview with [facility] yesterday. Submitted a request for financial help via [facility] Patient Assistance Fund per suggestion of Marisa Sprinkles, Visual merchandiser. CSW is continuing to work with supervisor to find rehab bed for patient. TOC will continue to follow during the course of hospitalization.     Expected Discharge Plan: Skilled Nursing Facility Barriers to Discharge: SNF Pending bed offer  Expected Discharge Plan and Services Expected Discharge Plan: Skilled Nursing Facility                                               Social Determinants of Health (SDOH) Interventions    Readmission Risk Interventions No flowsheet data found.

## 2021-01-13 NOTE — Progress Notes (Addendum)
   01/13/21 1400  Clinical Encounter Type  Visited With Patient  Visit Type Follow-up  Referral From Chaplain  Consult/Referral To Chaplain  Spiritual Encounters  Spiritual Needs Sacred text;Prayer;Ritual;Emotional  Stress Factors  Patient Stress Factors Loss;Loss of control;Major life changes;Health changes  Family Stress Factors None identified  Chaplain met with patient at bedside this afternoon.  Carla Little was alert and eager to talk.  She expressed her life narrative and described how she is surprised by her sudden health deterioration and her newly diagnosis.  She expressed her spiritual strength in her yearning to do more physical therapy and improve towards her ability to walk and gain inner emotional strength to do so.    When Chaplain asked her "what keeps you going/" she replied with an exuberant statement: " I am determined to walk!" She spoke of her theologically and spiritual challenges on her new health changes. Chaplain provided an opportunity for Carla Little to [employer] and spiritual strength from her given name. She stated that she now understood that "my name [name] and I can do what I want eventually."   No existential distress was exhibited at this visit.    Patient spoke of the strength she garners from her mother "[name]" and her Aunt "[name]" and what they meant to her.  Carla Little made progress in vocalizing her inner strength and guidance fromher family that have passed.  She described how she also gains great strength from the staff at the hospital who encourage her to progress every day "even when Im having a bd day they really help me."   She requested prayer and Chaplain prayed with her and anointed her with oil.  Chaplain ended visit with a departing prayer.

## 2021-01-14 LAB — BASIC METABOLIC PANEL
Anion gap: 5 (ref 5–15)
BUN: 7 mg/dL (ref 6–20)
CO2: 35 mmol/L — ABNORMAL HIGH (ref 22–32)
Calcium: 8.9 mg/dL (ref 8.9–10.3)
Chloride: 93 mmol/L — ABNORMAL LOW (ref 98–111)
Creatinine, Ser: 0.38 mg/dL — ABNORMAL LOW (ref 0.44–1.00)
GFR, Estimated: >60 mL/min (ref >60)
Glucose, Bld: 100 mg/dL — ABNORMAL HIGH (ref 70–99)
Potassium: 4 mmol/L (ref 3.5–5.1)
Sodium: 133 mmol/L — ABNORMAL LOW (ref 135–145)

## 2021-01-14 MED ORDER — POTASSIUM CHLORIDE CRYS ER 20 MEQ PO TBCR
20.0000 meq | EXTENDED_RELEASE_TABLET | Freq: Every day | ORAL | Status: DC
  Administered 2021-01-14: 20 meq via ORAL
  Filled 2021-01-14: qty 1

## 2021-01-14 MED ORDER — BUPROPION HCL ER (XL) 150 MG PO TB24
150.0000 mg | ORAL_TABLET | Freq: Every day | ORAL | Status: DC
  Administered 2021-01-14 – 2021-01-26 (×13): 150 mg via ORAL
  Filled 2021-01-14 (×13): qty 1

## 2021-01-14 MED ORDER — FUROSEMIDE 10 MG/ML IJ SOLN
40.0000 mg | Freq: Two times a day (BID) | INTRAMUSCULAR | Status: DC
  Administered 2021-01-14 – 2021-01-15 (×4): 40 mg via INTRAVENOUS
  Filled 2021-01-14 (×4): qty 4

## 2021-01-14 NOTE — Consult Note (Signed)
I   [facility] Face-to-Face Psychiatry Consult   Reason for Consult: Assistance with medication management.  Prolonged hospital stay.  Anxiety and depression.  Very morbid obesity.  Remeron stopped due to concern for weight gain. Referring Physician:  Dr. Roselee Nova  Patient Identification: Carla Little MRN:  [id number] Principal Diagnosis: Intractable vomiting with nausea Diagnosis:  Principal Problem:   Intractable vomiting with nausea Active Problems:   Left knee dislocation   PUD (peptic ulcer disease)   Nonalcoholic steatohepatitis (NASH)   Class 3 obesity (HCC)   Sinus tachycardia   Hypokalemia   GERD (gastroesophageal reflux disease)   Iron deficiency anemia due to chronic blood loss   E. coli UTI   Guillain Barr syndrome (HCC)   Total Time spent with patient: 30 minutes  Subjective:   Carla Little is a 30 y.o. female patient admitted with nausea and vomiting, myalgia and worsening b generalized pain.  pt was found to have albuminocytologic dissociation and diagnosed with Guillain-Barre syndrome. Psychiatry was consulted previously during hospitalization and  recommended buspar 5 mg BID for gastric emptying and anxiety. Patient has been compliant with medications since then  Patient seen and chart reviewed. Patient is found laying in bed in NAD. She is calm, cooperative, and pleasant. She describes her mood as "ok" and rates it 8/10 (10 being the best)). She reports associated depressive sx of  difficulty with sleep, decreased energy and decreased appetite. She denies hopelessness, concentration difficulties. Patient describes enjoying her visit with the chaplain. She expresses excitement when discussing working with PT/OT and sitting on the side of the bed for the first time yesterday since hospitalization.   Discussed medication; after discussing r/b patient is agreeable to trial of wellbutrin. She denies SI/HI/AVH.  HPI:  Patient is a 30 year old female with morbid  obesity, nonalcoholic fatty liver disease, GERD, depression, recently was hospitalized following a mechanical fall with a left knee dislocation, resultant MCL strain, ACL and PCL rupture then complicated by nausea vomiting, tachycardia, UTI and fecal impaction during the hospitalization. Patient had an endoscopy which noted small antral ulcer on 8/27, RUQ ultrasound was unremarkable.  Patient presented to ED with multiple episodes of nausea, vomiting, aches and pains everywhere. In ED, afebrile, mildly tachycardic, hypertensive.   Past Psychiatric History: History of depression and suicidal ideations, last admitted to OV in 2020. She denies any actual suicide attempts. She currently has no outpatient psychiatric providers, but is interested in services. Denies any self harm behaviors.   Risk to Self: Denies Risk to Others:  Denies Prior Inpatient Therapy:   [facility] 2020, for suicidal ideations and depression Prior Outpatient Therapy:    Past Medical History:  Past Medical History:  Diagnosis Date   Class 3 obesity (HCC) 12/09/2020   Depression    GERD (gastroesophageal reflux disease)    Nonalcoholic steatohepatitis (NASH) 12/09/2020   Obesity    PUD (peptic ulcer disease) 12/09/2020    Past Surgical History:  Procedure Laterality Date   BIOPSY  11/19/2020   Procedure: BIOPSY;  Surgeon: Charna Elizabeth, MD;  Location: [facility];  Service: Endoscopy;;   ESOPHAGOGASTRODUODENOSCOPY N/A 01/10/2021   Procedure: ESOPHAGOGASTRODUODENOSCOPY (EGD);  Surgeon: Kathi Der, MD;  Location: [facility];  Service: Gastroenterology;  Laterality: N/A;   ESOPHAGOGASTRODUODENOSCOPY (EGD) WITH PROPOFOL N/A 11/19/2020   Procedure: ESOPHAGOGASTRODUODENOSCOPY (EGD) WITH PROPOFOL;  Surgeon: Charna Elizabeth, MD;  Location: [facility];  Service: Endoscopy;  Laterality: N/A;   FLEXIBLE SIGMOIDOSCOPY N/A 01/10/2021   Procedure: FLEXIBLE SIGMOIDOSCOPY;  Surgeon: Levora Angel,  Darcus Austin, MD;  Location: [facility]  END[facility];  Service: Gastroenterology;  Laterality: N/A;   Family History:  Family History  Problem Relation Age of Onset   Hypertension Other    Family Psychiatric  History: Denies Social History:  Social History   Substance and Sexual Activity  Alcohol Use Yes     Social History   Substance and Sexual Activity  Drug Use No    Social History   Socioeconomic History   Marital status: Single    Spouse name: Not on file   Number of children: Not on file   Years of education: Not on file   Highest education level: Not on file  Occupational History   Not on file  Tobacco Use   Smoking status: Never   Smokeless tobacco: Never  Substance and Sexual Activity   Alcohol use: Yes   Drug use: No   Sexual activity: Not on file  Other Topics Concern   Not on file  Social History Narrative   Not on file   Social Determinants of Health   Financial Resource Strain: Not on file  Food Insecurity: Not on file  Transportation Needs: Not on file  Physical Activity: Not on file  Stress: Not on file  Social Connections: Not on file   Additional Social History:    Allergies:   Allergies  Allergen Reactions   Azithromycin Shortness Of Breath and Nausea And Vomiting    Labs:  Results for orders placed or performed during the hospital encounter of 12/09/20 (from the past 48 hour(s))  Basic metabolic panel     Status: Abnormal   Collection Time: 01/13/21  5:05 AM  Result Value Ref Range   Sodium 133 (L) 135 - 145 mmol/L   Potassium 3.5 3.5 - 5.1 mmol/L   Chloride 93 (L) 98 - 111 mmol/L   CO2 32 22 - 32 mmol/L   Glucose, Bld 101 (H) 70 - 99 mg/dL    Comment: Glucose reference range applies only to samples taken after fasting for at least 8 hours.   BUN 6 6 - 20 mg/dL   Creatinine, Ser 4.31 (L) 0.44 - 1.00 mg/dL   Calcium 8.8 (L) 8.9 - 10.3 mg/dL   GFR, Estimated >54 >00 mL/min    Comment: (NOTE) Calculated using the CKD-EPI Creatinine Equation (2021)    Anion gap 8 5 -  15    Comment: Performed at [facility], [address], [place], [place] [address]  Basic metabolic panel     Status: Abnormal   Collection Time: 01/14/21  6:04 AM  Result Value Ref Range   Sodium 133 (L) 135 - 145 mmol/L   Potassium 4.0 3.5 - 5.1 mmol/L   Chloride 93 (L) 98 - 111 mmol/L   CO2 35 (H) 22 - 32 mmol/L   Glucose, Bld 100 (H) 70 - 99 mg/dL    Comment: Glucose reference range applies only to samples taken after fasting for at least 8 hours.   BUN 7 6 - 20 mg/dL   Creatinine, Ser 9.50 (L) 0.44 - 1.00 mg/dL   Calcium 8.9 8.9 - 93.2 mg/dL   GFR, Estimated >67 >12 mL/min    Comment: (NOTE) Calculated using the CKD-EPI Creatinine Equation (2021)    Anion gap 5 5 - 15    Comment: Performed at [facility], [address], [place], [place] [address]    Current Facility-Administered Medications  Medication Dose Route Frequency Provider Last Rate Last Admin  0.9 %  sodium chloride infusion   Intravenous PRN Luiz Iron, NP 10 mL/hr at 01/07/21 1710 Infusion Verify at 01/07/21 1710   acetaminophen (TYLENOL) tablet 650 mg  650 mg Oral Q6H PRN Bobette Mo, MD   650 mg at 01/02/21 0263   Or   acetaminophen (TYLENOL) suppository 650 mg  650 mg Rectal Q6H PRN Bobette Mo, MD       buPROPion (WELLBUTRIN XL) 24 hr tablet 150 mg  150 mg Oral Daily Estella Husk, MD   150 mg at 01/14/21 1640   busPIRone (BUSPAR) tablet 5 mg  5 mg Oral BID Maryagnes Amos, FNP   5 mg at 01/14/21 1149   cyanocobalamin ((VITAMIN B-12)) injection 1,000 mcg  1,000 mcg Intramuscular Weekly Rejeana Brock, MD   1,000 mcg at 01/08/21 1039   diphenhydrAMINE (BENADRYL) 12.5 MG/5ML elixir 12.5 mg  12.5 mg Oral Q6H PRN Narda Bonds, MD   12.5 mg at 01/08/21 0824   enoxaparin (LOVENOX) injection 50 mg  50 mg Subcutaneous Q12H Myrtie Neither, MD   50 mg at 01/14/21 1155   feeding supplement (ENSURE ENLIVE / ENSURE PLUS) liquid 237 mL   237 mL Oral TID BM Narda Bonds, MD   237 mL at 01/14/21 1500   folic acid (FOLVITE) tablet 1 mg  1 mg Oral Daily Danford Bad, RPH   1 mg at 01/14/21 1148   furosemide (LASIX) injection 40 mg  40 mg Intravenous BID Marcellus Scott D, MD   40 mg at 01/14/21 1206   hydrocortisone (ANUSOL-HC) suppository 25 mg  25 mg Rectal BID Narda Bonds, MD   25 mg at 01/14/21 1147   lidocaine (LIDODERM) 5 % 1 patch  1 patch Transdermal Daily Pokhrel, Laxman, MD   1 patch at 01/14/21 1154   magic mouthwash w/lidocaine  5 mL Oral TID AC Rai, Ripudeep K, MD   5 mL at 01/14/21 1147   menthol-cetylpyridinium (CEPACOL) lozenge 3 mg  1 lozenge Oral PRN Audrea Muscat T, NP   3 mg at 12/14/20 1855   methocarbamol (ROBAXIN) tablet 750 mg  750 mg Oral Q6H PRN Narda Bonds, MD   750 mg at 01/14/21 1148   metoprolol tartrate (LOPRESSOR) tablet 25 mg  25 mg Oral BID Myrtie Neither, MD   25 mg at 01/14/21 1148   multivitamin with minerals tablet 1 tablet  1 tablet Oral Daily Elease Etienne, MD   1 tablet at 01/13/21 1036   ondansetron (ZOFRAN-ODT) disintegrating tablet 4 mg  4 mg Oral Q6H PRN Narda Bonds, MD       Or   ondansetron [facility]) injection 4 mg  4 mg Intravenous Q6H PRN Narda Bonds, MD   4 mg at 01/13/21 1353   oxyCODONE (ROXICODONE) 5 MG/5ML solution 5-10 mg  5-10 mg Oral Q4H PRN Narda Bonds, MD   10 mg at 01/14/21 1630   pantoprazole (PROTONIX) EC tablet 40 mg  40 mg Oral BID Danford Bad, RPH   40 mg at 01/14/21 1149   phenol (CHLORASEPTIC) mouth spray 1 spray  1 spray Mouth/Throat PRN Rai, Ripudeep K, MD       polyethylene glycol (MIRALAX / GLYCOLAX) packet 17 g  17 g Oral Daily McMichael, Bayley M, PA-C       potassium chloride SA (KLOR-CON) CR tablet 20 mEq  20 mEq Oral Daily Elease Etienne, MD   20 mEq at 01/14/21  1640   pregabalin (LYRICA) capsule 75 mg  75 mg Oral TID Narda Bonds, MD   75 mg at 01/14/21 1640   senna-docusate (Senokot-S) tablet 1 tablet  1 tablet Oral  BID Rodolph Bong, MD   1 tablet at 01/14/21 1149   sodium chloride flush (NS) 0.9 % injection 3 mL  3 mL Intravenous Q12H Narda Bonds, MD   3 mL at 01/14/21 1156    Musculoskeletal: Strength & Muscle Tone: within normal limits Gait & Station: normal Patient leans: N/A            Psychiatric Specialty Exam:  Presentation  General Appearance: Appropriate for Environment; Casual  Eye Contact:Good  Speech:Clear and Coherent; Normal Rate  Speech Volume:Normal  Handedness:Right   Mood and Affect  Mood:Euthymic  Affect:Appropriate; Congruent   Thought Process  Thought Processes:Goal Directed; Coherent; Linear  Descriptions of Associations:Intact  Orientation:Full (Time, Place and Person)  Thought Content:Logical  History of Schizophrenia/Schizoaffective disorder:No data recorded Duration of Psychotic Symptoms:No data recorded Hallucinations:Hallucinations: None  Ideas of Reference:None  Suicidal Thoughts:Suicidal Thoughts: No  Homicidal Thoughts:Homicidal Thoughts: No   Sensorium  Memory:Immediate Good; Remote Good; Recent Good  Judgment:Good  Insight:Good   Executive Functions  Concentration:Good  Attention Span:Good  Recall:Good  Fund of Knowledge:Good  Language:Good   Psychomotor Activity  Psychomotor Activity:Psychomotor Activity: Normal   Assets  Assets:Communication Skills; Desire for Improvement; Social Support   Sleep  Sleep:Sleep: Fair   Physical Exam: Physical Exam Vitals and nursing note reviewed.  Constitutional:      General: She is awake.     Appearance: Normal appearance. She is morbidly obese.  HENT:     Head: Normocephalic.  Neurological:     General: No focal deficit present.     Mental Status: She is alert and oriented to person, place, and time. Mental status is at baseline.  Psychiatric:        Mood and Affect: Mood is depressed.        Behavior: Behavior normal. Behavior is cooperative.         Thought Content: Thought content normal.        Judgment: Judgment normal.   Review of Systems  Musculoskeletal:  Positive for joint pain, myalgias and neck pain.  Neurological:  Positive for weakness.  Psychiatric/Behavioral:  Positive for depression. Negative for hallucinations, memory loss, substance abuse and suicidal ideas. The patient is nervous/anxious. The patient does not have insomnia.   All other systems reviewed and are negative. Blood pressure 131/79, pulse (!) 102, temperature (!) 97.5 F (36.4 C), temperature source Oral, resp. rate 18, height 5' 4.02" (1.626 m), weight (!) 194.5 kg, last menstrual period 12/09/2020, SpO2 98 %. Body mass index is 73.57 kg/m.  Treatment Plan Summary: 30  yo female with a history of depression and anxiety who is currently admitted with guillian barre syndrome. Psychiatry consulted previously in hospitalization for assistance and recommended buspar 5 mg BID. Psychiatry re-consulted 10/21 for continued low mood. Patient agreeable to trial of wellbutrin as below   Recommendations: -continue buspar 5 mg BID for anxiety and gastric emptying -start wellbutrin 150 mg XL daily for mood -continue visits by chaplain as appears to be helpful for patient's mood and morale  -Patient will be a great candidate for intensive outpatient, once medically stable. -Consult for inpatient rehab has been placed, patient will also receive some neuropsychological services if accepted to CIR.   -discussed updated medication recommendations with primary team via secure chat and  ordered medications as per request.   -psychiatry to follow up tomorrow  Disposition: No evidence of imminent risk to self or others at present.   Patient does not meet criteria for psychiatric inpatient admission. Supportive therapy provided about ongoing stressors. Refer to IOP.  Estella Husk, MD Attending psychiatrist 01/14/2021 6:07 PM

## 2021-01-14 NOTE — Progress Notes (Signed)
Physical Therapy Treatment Patient Details Name: Carla Little MRN: [id number] DOB: 1990-05-16 Today's Date: 01/14/2021   History of Present Illness Patient is a 30 y.o. female who presented to [facility] for intractable N/V on 9/17. ED labwork reveals e. coli UTI. Pt had recent hospital admission from 8/8-9/5 due to Lt knee dislocation which was reduced with fractures of the proximal fibula ligamentous avulsion laterally as well as medially off the medial femoral condyle and MRI showed complete ACL & PCL tears and MCL strain. That hospital admission was complicated by nausea, vomiting, tachycardia, UTI, and fecal impaction;. PMH significant for morbid obesity, GERD, fatty liver disease, depression. Patient now with Guillian-Barre syndrome    PT Comments    Patient reports not feeling well, Noted  mildly diaphoretic / BP 125/89 hr118, sitting upright 128/93 HR 119.  Assisted in sitting in bed chair position. Patient having more difficulty with balance and leaning  forward, appears with more edema of the extremities and certainly abdomen today. Patient performed UE exercises but not with the vigor as in past few days in therapy.  Patient to meetwith Dr.  Carlis Abbott From [facility] to learn more about GBS and progress expected.  Recommendations for follow up therapy are one component of a multi-disciplinary discharge planning process, led by the attending physician.  Recommendations may be updated based on patient status, additional functional criteria and insurance authorization.  Follow Up Recommendations  SNF     Equipment Recommendations  Wheelchair (measurements PT);Wheelchair cushion (measurements PT)    Recommendations for Other Services       Precautions / Restrictions Precautions Precautions: Fall Precaution Comments: LE's remain very weak, not supporting in tilt to stand. Other Brace: does not need KI at this time but may consider since opatient has more weakness due GBS      Mobility  Bed Mobility               General bed mobility comments: Placed patient in seated upright bed position with legs down. Required max assistnace to lean forward, unable to lean as far, appears with increased edema of abdomen and leg and arms today , hindering Sat upright x 20 minutes.    Transfers                    Ambulation/Gait                 Stairs             Wheelchair Mobility    Modified Rankin (Stroke Patients Only)       Balance Overall balance assessment: Needs assistance Sitting-balance support: No upper extremity supported Sitting balance-Leahy Scale: Fair Sitting balance - Comments: little more difficulty due to edema of abdomen                                    Cognition Arousal/Alertness: Awake/alert Behavior During Therapy: Flat affect Overall Cognitive Status: Within Functional Limits for tasks assessed                                 General Comments: not as  perky, states she feels bad      Exercises General Exercises - Upper Extremity Shoulder Flexion: AAROM;Both;10 reps;Seated Shoulder Extension: Strengthening;10 reps;Seated;Theraband Theraband Level (Shoulder Extension): Level 2 (Red) Shoulder ABduction: AAROM;10 reps;Both Elbow Flexion: Strengthening;Both;10 reps;Seated;Theraband  Elbow Extension: Strengthening;Both;10 reps;Seated;Theraband Theraband Level (Elbow Extension): Level 2 (Red)    General Comments        Pertinent Vitals/Pain Faces Pain Scale: Hurts even more Pain Location: all over" Pain Descriptors / Indicators: Aching;Sore    Home Living                      Prior Function            PT Goals (current goals can now be found in the care plan section) Progress towards PT goals: Progressing toward goals    Frequency           PT Plan Current plan remains appropriate    Co-evaluation              AM-PAC PT "6 Clicks"  Mobility   Outcome Measure  Help needed turning from your back to your side while in a flat bed without using bedrails?: Total Help needed moving from lying on your back to sitting on the side of a flat bed without using bedrails?: Total Help needed moving to and from a bed to a chair (including a wheelchair)?: Total Help needed standing up from a chair using your arms (e.g., wheelchair or bedside chair)?: Total Help needed to walk in hospital room?: Total Help needed climbing 3-5 steps with a railing? : Total 6 Click Score: 6    End of Session Equipment Utilized During Treatment: Gait belt Activity Tolerance: Patient tolerated treatment well Patient left: in bed;with call bell/phone within reach Nurse Communication: Mobility status;Need for lift equipment PT Visit Diagnosis: Muscle weakness (generalized) (M62.81);Other symptoms and signs involving the nervous system (R29.898)     Time: 0867-6195 PT Time Calculation (min) (ACUTE ONLY): 28 min  Charges:  $Therapeutic Activity: 23-37 mins                     Blanchard Kelch PT Acute Rehabilitation Services Pager [phone] Office [phone]    Rada Hay 01/14/2021, 5:25 PM

## 2021-01-14 NOTE — Progress Notes (Signed)
   01/14/21 0520  Assess: MEWS Score  Temp 99.1 F (37.3 C)  BP 130/79  Pulse Rate (!) 115  Resp 18  SpO2 98 %  O2 Device Nasal Cannula  Assess: MEWS Score  MEWS Temp 0  MEWS Systolic 0  MEWS Pulse 2  MEWS RR 0  MEWS LOC 0  MEWS Score 2  MEWS Score Color Yellow  Assess: if the MEWS score is Yellow or Red  Were vital signs taken at a resting state? Yes  Focused Assessment No change from prior assessment  Does the patient meet 2 or more of the SIRS criteria? Yes  Does the patient have a confirmed or suspected source of infection? No  Provider and Rapid Response Notified? No  MEWS guidelines implemented *See Row Information* Yes  Treat  MEWS Interventions Administered scheduled meds/treatments;Other (Comment) (O2 Flow rate increased to 2 L)  Pain Scale 0-10  Pain Score Asleep  3rd Pain Site  Faces Pain Scale 0  Take Vital Signs  Increase Vital Sign Frequency  Yellow: Q 2hr X 2 then Q 4hr X 2, if remains yellow, continue Q 4hrs  Escalate  MEWS: Escalate Yellow: discuss with charge nurse/RN and consider discussing with provider and RRT  Notify: Charge Nurse/RN  Name of Charge Nurse/RN Notified Irving Burton RN  Date Charge Nurse/RN Notified 01/14/21  Time Charge Nurse/RN Notified [phone]  Document  Patient Outcome Stabilized after interventions  Progress note created (see row info) Yes  Assess: SIRS CRITERIA  SIRS Temperature  0  SIRS Pulse 1  SIRS Respirations  0  SIRS WBC 0  SIRS Score Sum  1

## 2021-01-14 NOTE — Progress Notes (Signed)
Re-attempting O2 wean off. Patient on 1L currently O2 sat 94.

## 2021-01-14 NOTE — Progress Notes (Signed)
O2 rate increased to 2L.

## 2021-01-14 NOTE — Consult Note (Addendum)
Physical Medicine and Rehabilitation Consult Reason for Consult: GBS Referring Therapist: Blanchard Kelch, PT   HPI: Carla Little is a 30 y.o. female with a PMH of morbid obesity, GERD, fatty liver disease, and depression, who presented to [facility] with intractable nausea and vomiting on 12/10/20 and was found to have an E. Coli UTI. She was previously admitted from 8/8 to 9/5 with left knee dislocation, fractures of the proximal fibula with ligamentous avulsion laterally and medially off the medial femoral condyle. MRI showed complete ACL and PCL tears and MCL strain. Her hospital admission was complicated by nausea, vomiting, tachycardia, UTI, and fecal impaction. 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 She was also found to have vitamin B12 and folate deficiencies. She underwent colonoscopy and EGD on 10/18 for nausea, vomiting, and rectal pain with no abnormalities noted. She developed multifactorial anasarca which is being treated with IV Lasix. PM&R was consulted by PT Blanchard Kelch for patient education regarding GBS.    Review of Systems  Constitutional: Negative.   HENT: Negative.    Eyes: Negative.   Respiratory:  Positive for shortness of breath.   Cardiovascular:  Positive for leg swelling.  Gastrointestinal: Negative.   Genitourinary: Negative.   Musculoskeletal:        Bilateral leg pain  Skin: Negative.   Neurological:  Positive for weakness.  Endo/Heme/Allergies: Negative.   Psychiatric/Behavioral:  Positive for depression.   Past Medical History:  Diagnosis Date   Class 3 obesity (HCC) 12/09/2020   Depression    GERD (gastroesophageal reflux disease)    Nonalcoholic steatohepatitis (NASH) 12/09/2020   Obesity    PUD (peptic ulcer disease) 12/09/2020   Past Surgical History:  Procedure Laterality Date   BIOPSY  11/19/2020   Procedure: BIOPSY;  Surgeon: Charna Elizabeth, MD;   Location: [facility];  Service: Endoscopy;;   ESOPHAGOGASTRODUODENOSCOPY N/A 01/10/2021   Procedure: ESOPHAGOGASTRODUODENOSCOPY (EGD);  Surgeon: Kathi Der, MD;  Location: [facility];  Service: Gastroenterology;  Laterality: N/A;   ESOPHAGOGASTRODUODENOSCOPY (EGD) WITH PROPOFOL N/A 11/19/2020   Procedure: ESOPHAGOGASTRODUODENOSCOPY (EGD) WITH PROPOFOL;  Surgeon: Charna Elizabeth, MD;  Location: [facility];  Service: Endoscopy;  Laterality: N/A;   FLEXIBLE SIGMOIDOSCOPY N/A 01/10/2021   Procedure: FLEXIBLE SIGMOIDOSCOPY;  Surgeon: Kathi Der, MD;  Location: [facility];  Service: Gastroenterology;  Laterality: N/A;   Family History  Problem Relation Age of Onset   Hypertension Other    Social History:  reports that she has never smoked. She has never used smokeless tobacco. She reports current alcohol use. She reports that she does not use drugs. Allergies:  Allergies  Allergen Reactions   Azithromycin Shortness Of Breath and Nausea And Vomiting   Medications Prior to Admission  Medication Sig Dispense Refill   cholecalciferol (VITAMIN D) 25 MCG tablet Take 2 tablets (2,000 Units total) by mouth daily. 30 tablet 1   methocarbamol (ROBAXIN) 500 MG tablet Take 1 tablet (500 mg total) by mouth every 6 (six) hours as needed for muscle spasms. 20 tablet 0   metoprolol tartrate (LOPRESSOR) 25 MG tablet Take 0.5 tablets (12.5 mg total) by mouth 2 (two) times daily. 60 tablet 1   ondansetron (ZOFRAN ODT) 4 MG disintegrating tablet Take 1 tablet (4 mg total) by mouth every 8 (eight) hours as needed for nausea or vomiting. 20 tablet 0   ondansetron (ZOFRAN) 4 MG tablet Take 1 tablet (4 mg total) by mouth every 6 (  six) hours as needed for nausea. (Patient not taking: Reported on 11/29/2020) 20 tablet 0   oxyCODONE (OXY IR/ROXICODONE) 5 MG immediate release tablet Take 1 tablet (5 mg total) by mouth every 4 (four) hours as needed for moderate pain (pain score 4-6). 10 tablet 0    promethazine (PHENERGAN) 25 MG suppository Place 1 suppository (25 mg total) rectally every 6 (six) hours as needed for nausea or vomiting. 12 each 0    Home: Home Living Family/patient expects to be discharged to:: Private residence Living Arrangements: Alone Available Help at Discharge: Friend(s), Available PRN/intermittently Type of Home: Apartment Home Access: Level entry Home Layout: One level Bathroom Shower/Tub: Engineer, manufacturing systems: Standard Bathroom Accessibility: Yes Home Equipment: Bedside commode, Environmental consultant - 2 wheels, Adaptive equipment Adaptive Equipment: Sock aid Additional Comments: pt's cousin was supposed to stay ~2 weeks after she discharged from the hospital but she only stayed about 4 days due to disagreements. Pt reports cousin was trying to pull on her brace and tha the metal posts on the brace began to protrude through the fabric. She had to take it off and has just been trying to keep her knee straight. She has no other family/friends to help. Pt also reports her bed is still too high for her and she has to crawl up in bed on her stomach.  Functional History: Prior Function Level of Independence: Needs assistance Gait / Transfers Assistance Needed: pt reports she was walking with her walker the length of her apartment about 2-3x/day. ADL's / Homemaking Assistance Needed: pt has been doing sink baths since her last hospital stay due to fear of falling in shower/tub. she is scared she will be too fatigeud or slip standing in shower and knows she can't getup from sitting in the tub. Reports predominantly getting to [facility] only Functional Status:  Mobility: Bed Mobility Overal bed mobility: Needs Assistance Bed Mobility: Rolling Rolling: Max assist, +2 for physical assistance, +2 for safety/equipment Sidelying to sit: Max assist, +2 for safety/equipment, +2 for physical assistance, Total assist Supine to sit: HOB elevated, +2 for physical assistance, Min  assist Sit to supine: +2 for physical assistance, Total assist General bed mobility comments: Placed patient in seated upright bed position. with 2 persons moving legs and 1 at trunk, assisted moving legs ariound  and over bed edge, using bed pads to  scoot around. Feet placed in recliner for support. Patient sat  x 20 minutes without balance loss, did  not x 1 list posteriorly. + 3 to return to supine and reposition. Transfers Overall transfer level: Needs assistance Equipment used: Rolling walker (2 wheeled) Transfer via Lift Equipment: [employer] Transfers: Sit to/from Stand, [employer] Transfers Sit to Stand: +2 safety/equipment, +2 physical assistance, Min assist Stand pivot transfers: Mod assist, Min assist, +2 safety/equipment, +2 physical assistance General transfer comment: pt positioned in chair position for session.      ADL: ADL Overall ADL's : Needs assistance/impaired Eating/Feeding: Minimal assistance, Bed level, Set up Eating/Feeding Details (indicate cue type and reason): Patient performed feeding task at bed level needing set up. Min assist at times to assist with locating food when dropped and 2 times to assist with getting food in mouth. patient predominantly used right hand to grasp food - chicken pieces and fries. Patient limtied by sensory deficitsi n bilateral hands and mouth. Slow mastification with 3 episodes of gagging episodes which was reduced by eating smaller pieces of food. therapist provided patient with sippy cup with two handles  that patient was able to use with both hands to successfully drink from with minimal spillage. use of cell phone camera to use as a mirror to assist with feeding - patient did better getting food in to mouth with visual aide. Grooming: Oral care, Bed level, Maximal assistance, Wash/dry face, Wash/dry hands Grooming Details (indicate cue type and reason): Patient able to use yonger to assist with getting particles out of her mouth.  Assistance needed to brush teeth and get larger chicken pieces out of her mouth. Mod assist to wash face. Mod assist to wash hands with quality. Upper Body Bathing: Minimal assistance, Sitting, Bed level Lower Body Bathing: Maximal assistance, Bed level, Sitting/lateral leans Upper Body Dressing : Sitting, Bed level, Set up Lower Body Dressing: Total assistance, Sitting/lateral leans, Bed level Toilet Transfer: BSC, Minimal assistance, RW, +2 for safety/equipment Toileting- Clothing Manipulation and Hygiene: Total assistance, Sit to/from stand, +2 for safety/equipment Toileting - Clothing Manipulation Details (indicate cue type and reason): 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 Also limited by body habitus General ADL Comments: nursing was educated on keeping pillows under BUE to reduce edema in hands to maitnain patients ability to use hands for choice tasks and ADLs. nurse and NT verbalzied and demonstrated understanding.  Cognition: Cognition Overall Cognitive Status: Within Functional Limits for tasks assessed Arousal/Alertness: Awake/alert Orientation Level: Oriented X4 Cognition Arousal/Alertness: Awake/alert Behavior During Therapy: Flat affect Overall Cognitive Status: Within Functional Limits for tasks assessed General Comments: not as  perky, states she feels sore all over but will push through it.  Blood pressure 137/86, pulse (!) 111, temperature 98.7 F (37.1 C), temperature source Oral, resp. rate 18, height 5' 4.02" (1.626 m), weight (!) 194.5 kg, last menstrual period 12/09/2020, SpO2 97 %. Physical Exam Gen: no distress, normal appearing, BMI 73.57 HEENT: oral mucosa pink and moist, NCAT, [place] in place Cardio: Tachycardic Chest: normal effort, normal rate of breathing Abd: soft, non-distended Ext: +severe anasarca Psych: pleasant, normal affect Skin: intact Neuro: Musculoskeletal: 3/5 DF and PF  bilaterally- range limited by anasarca, unable to elevate legs off bed due to weakness/anasarca, sensation intact  Results for orders placed or performed during the hospital encounter of 12/09/20 (from the past 24 hour(s))  Basic metabolic panel     Status: Abnormal   Collection Time: 01/14/21  6:04 AM  Result Value Ref Range   Sodium 133 (L) 135 - 145 mmol/L   Potassium 4.0 3.5 - 5.1 mmol/L   Chloride 93 (L) 98 - 111 mmol/L   CO2 35 (H) 22 - 32 mmol/L   Glucose, Bld 100 (H) 70 - 99 mg/dL   BUN 7 6 - 20 mg/dL   Creatinine, Ser 5.36 (L) 0.44 - 1.00 mg/dL   Calcium 8.9 8.9 - 64.4 mg/dL   GFR, Estimated >03 >47 mL/min   Anion gap 5 5 - 15   No results found.  Assessment/Plan 1) GBS -Therapy and medical notes reviewed. She is working on Airline pilot exercises. Lower extremities remain weak and HR is up to 114 with exercise. -s/p 5 days IVIG -Lyrica has been increased to 75mg  TID -continued on B12 for 4 weeks with monthly B12 injections thereafter -will schedule for outpatient follow-up with me  -discussed what GBS is and her prognosis, answered all her questions, and provided with our clinic phone number.   2) Pain -she is receiving Lyrica 75mg  TID -oxycodone 5mg  q4Hprn -robaxin 500mg   q6H prn -all above medicines are being used regularly. -she is not sure if lyrica is helping and it could be contributing to swelling- will discuss with Dr. Waymon Amato tapering and if pain increases, then we know it is helping and can increase back to current dose -provided with a pain relief journal and Clydie Braun from PT will assist her in writing it in it.   3) Anasarca -will discuss with Dr. Waymon Amato IV albumin   70 minutes spent in review of patient's chart, communication with patient regarding her GBS, typical treatment options, prognosis, pain, anasarca, weight, follow-up; providing with a pain relief journal; communicating with patient's physical therapist Clydie Braun and attending Dr.  Waymon Amato.   Horton Chin, MD 01/14/2021

## 2021-01-14 NOTE — Progress Notes (Signed)
NIF -30 FVC 1.2 L

## 2021-01-14 NOTE — Progress Notes (Signed)
PROGRESS NOTE    DEONDRIA PURYEAR  PPI:[id number] DOB: December 03, 1990 DOA: 12/09/2020 PCP: Patient, No Pcp Per (Inactive)   Brief Narrative: BETTI GOODENOW is a 30 y.o. female with a history of morbid obesity, recent left knee dislocation. Patient presented secondary to weakness with presentation concerning for Guillain Barre syndrome. Neurology consulted and patient started on IVIG. She was also found to have vitamin B12 and folate deficiencies. Patient is now awaiting rehab options for discharge.  Developed nausea, vomiting, rectal pain/abnormality on CT, [facility] consulted and s/p colonoscopy and EGD 10/18.  Multifactorial anasarca, treating with IV Lasix.   Assessment & Plan:   Principal Problem:   Intractable vomiting with nausea Active Problems:   Left knee dislocation   PUD (peptic ulcer disease)   Nonalcoholic steatohepatitis (NASH)   Class 3 obesity (HCC)   Sinus tachycardia   Hypokalemia   GERD (gastroesophageal reflux disease)   Iron deficiency anemia due to chronic blood loss   E. coli UTI   Guillain Barr syndrome (HCC)   Guillain Barre syndrome Neuropathy Neurology consulted and completed IVIG x5 days as per recommendations. Also recommended treating confounding diagnoses of vitamin B12 and folate deficiency.  -Increased to Lyrica 75 mg TID -Neurology (signed off 10/2): Vitamin B12 qweek for 4 weeks, then monthly Vitamin B12 injections. - Consider outpatient follow-up with neurology. - As per PT input on 10/20, Dr. Alease Frame from [facility] has been consulted and is planning to see her on 10/22.  Patient is not a good CIR candidate due to lack of support.  Dr. Dalene Carrow can follow-up outpatient. -As per nursing, PT input and mild observation, patient is very motivated to get better and has been participating in therapies and making gradual progress.  Reportedly was able to sit up at edge of bed along with therapy/nursing on 10/21.  Intractable nausea and  vomiting Now resolved. [name] consulted, s/p EGD 10/18 without any acute abnormalities noted.  Rectal inflammation Seen on CT imaging. Rectal thickening extending into rectosigmoid junction. Patient does mention pain with having bowel movements/wiping but otherwise did not have symptoms. She reports history of no anal related sexual activity. No systemic symptoms concerning for infection. -GI consulted. Anusol suppositories, s/p flexible sigmoidoscopy which was not an adequate prep but did not show any significant abnormalities.  May need a repeat study as outpatient.  I discussed with Dr.Brahmbhatt, [facility] on 10/19 and he suggested that there was no need for repeating the sigmoidoscopy unless she develops rectal bleeding.  He did not see any major lesions.  Depression/anxiety Evaluated by psychiatry -Continue Buspar 5 mg BID - Given her very morbid obesity and risk of weight gain, Remeron which was started during this admission was discontinued.  Lexapro was started only for a day which patient was reluctant to take because she has taken it in the past and made her feel like a zombie.  She had been on BuSpar, Lexapro and as needed Vistaril in the past per her report.  Also Lexapro had made her eat a lot. - Thereby consulted psychiatry who recommended discontinuing Lexapro.  Today they have initiated Wellbutrin 150 Mg daily and will follow.  Their input is appreciated.  Shortness of breath Transient. Resolved.  Left knee dislocation Patient previously evaluated by orthopedic surgery with recommendation for hinge brace and to allow for joint scarring. Currently weight bearing as tolerated with limited flexion of left knee.  Sinus tachycardia This has been managed with metoprolol. Unsure of etiology. Possibly  related to deconditioning. No concerning symptoms.  Iron deficiency anemia Related to heavy menstruation. Hemoglobin stable in the 9 g range..  Elevated  AST/ALT NAFLD Hyperbilirubinemia AST/ALT with initial trend upwards. No associated symptoms. Abdominal ultrasound (10/11) significant for fatty liver disease and cholelithiasis without cholecystitis. INR normal. Bilirubin stable and AST/ALT now stable but elevated. GI consulted. -AST and ALT stable.  Periodic follow-up with labs and follow-up as outpatient.  B12 deficiency Folate deficiency B12 and folate of 153 and 3.2 respectively. Patient started on Vitamin B12 and folic acid supplementation  E. Coli UTI Completed treatment with Ceftriaxone and transitioned to Cefazolin.  Overnight hypoxia Likely patient has OSA and/or OHS judging by symptoms and body habitus. Currently managed with oxygen overnight while asleep. CT abdomen/pelvis significant for evidence of hypoventilation. Attempted to trial CPAP but patient declined treatment. Nocturnal oxygen saturation on room air showed hypoxia in the 80s and was placed back on oxygen.  Hypocalcemia Mild and likely related to hypoalbuminemia.  Hypokalemia -Potassium supplementation as needed  LE edema/anasarca Possibly related to hypoalbuminemia. No evidence of kidney or heart impairment. Liver does have some disease; normal bilirubin. No ascites on ultrasound. Significant bilateral upper extremity pitting edema.  DC IV fluids. - Still has significant third spacing/anasarca which is gradually improving.  Still with significant edema.  Renal panel looks okay.  Increase Lasix to 40 mg IV twice daily.  Also added potassium 20 M EQ daily.  Bilateral leg pain Low suspicion for acute DVT, however pain is apparently new onset. Difficult to assess from examination secondary to body habitus. Venous duplex was negative for acute DVT, bilaterally.  Possible oral candidiasis Treated with Diflucan IV.  Resolved.  Positive blood culture result 1/4 samples significant for staphylococcus epidermidis. Unlikely active infection.  Morbid obesity Body mass  index is 73.57 kg/m. Dietitian consulted. -Dietitian recommendations (10/12): Liberalize diet to regular to promote oral intake Will request food be chopped into bite sized pieces and dining services alert nursing staff when tray is delivered Nursing staff to assist with set-up and feeeding of meals Increase Ensure Enlive po to TID, each supplement provides 350 kcal and 20 grams of protein Continue MVI regimen   DVT prophylaxis: Lovenox Code Status:   Code Status: Full Code Family Communication: None at bedside Disposition Plan: DC to SNF pending bed.  Discussed with TOC during morning rounds.   Consultants:  Neurology Psychiatry Gastroenterology  Procedures:  None  Antimicrobials: Ceftriaxone Ancef Diflucan Flagyl    Subjective: No new complaints.  Reports swelling is gradually improving.  States that she has been in good spirits for the last few days.  Objective: Vitals:   01/14/21 0520 01/14/21 0722 01/14/21 0913 01/14/21 1317  BP: 130/79 126/84 137/86 125/88  Pulse: (!) 115 (!) 111 (!) 111 (!) 118  Resp: 18 18 18 18   Temp: 99.1 F (37.3 C) 98.6 F (37 C) 98.7 F (37.1 C) 99.1 F (37.3 C)  TempSrc: Oral Oral Oral Oral  SpO2: 98% 98% 97% 96%  Weight:      Height:        Intake/Output Summary (Last 24 hours) at 01/14/2021 1526 Last data filed at 01/13/2021 1922 Gross per 24 hour  Intake 354 ml  Output 750 ml  Net -396 ml    Filed Weights   01/12/21 0600 01/13/21 0506 01/14/21 0500  Weight: (!) 228 kg (!) 191.5 kg (!) 194.5 kg    Examination:  General exam: Pleasant young female, very morbidly obese, lying comfortably propped up  in bed without distress.   Respiratory system: Clear to auscultation.  No increased work of breathing. Cardiovascular system: S1 & S2 heard, RRR. No murmurs, rubs, gallops or clicks.  Heart to clearly appreciate JVD and lower extremity edema due to her body habitus.  Remains with significant bilateral upper extremity  edema. Gastrointestinal system: Abdomen is nondistended, soft and nontender. No organomegaly or masses felt. Normal bowel sounds heard. Central nervous system: Alert and oriented. No focal neurological deficits. Musculoskeletal: Moves all extremities symmetrically Skin: No cyanosis. No rashes Psychiatry: Judgement and insight appear normal.  Data Reviewed: I have personally reviewed following labs and imaging studies  CBC Lab Results  Component Value Date   WBC 8.8 01/11/2021   RBC 3.05 (L) 01/11/2021   HGB 9.0 (L) 01/11/2021   HCT 30.3 (L) 01/11/2021   MCV 99.3 01/11/2021   MCH 29.5 01/11/2021   PLT 491 (H) 01/11/2021   MCHC 29.7 (L) 01/11/2021   RDW 20.5 (H) 01/11/2021   LYMPHSABS 2.0 01/03/2021   MONOABS 0.9 01/03/2021   EOSABS 0.0 01/03/2021   BASOSABS 0.0 01/03/2021     Last metabolic panel Lab Results  Component Value Date   NA 133 (L) 01/14/2021   K 4.0 01/14/2021   CL 93 (L) 01/14/2021   CO2 35 (H) 01/14/2021   BUN 7 01/14/2021   CREATININE 0.38 (L) 01/14/2021   GLUCOSE 100 (H) 01/14/2021   GFRNONAA >60 01/14/2021   GFRAA >60 05/16/2018   CALCIUM 8.9 01/14/2021   PHOS 2.9 12/14/2020   PROT 8.1 01/12/2021   ALBUMIN 2.9 (L) 01/12/2021   BILITOT 1.7 (H) 01/12/2021   ALKPHOS 76 01/12/2021   AST 360 (H) 01/12/2021   ALT 127 (H) 01/12/2021   ANIONGAP 5 01/14/2021    CBG (last 3)  No results for input(s): GLUCAP in the last 72 hours.   GFR: Estimated Creatinine Clearance: 176.3 mL/min (A) (by C-G formula based on SCr of 0.38 mg/dL (L)).  Coagulation Profile: No results for input(s): INR, PROTIME in the last 168 hours.   Recent Results (from the past 240 hour(s))  Resp Panel by RT-PCR (Flu A&B, Covid) Nasopharyngeal Swab     Status: None   Collection Time: 01/06/21 10:09 AM   Specimen: Nasopharyngeal Swab; Nasopharyngeal(NP) swabs in vial transport medium  Result Value Ref Range Status   SARS Coronavirus 2 by RT PCR NEGATIVE NEGATIVE Final     Comment: (NOTE) SARS-CoV-2 target nucleic acids are NOT DETECTED.  The SARS-CoV-2 RNA is generally detectable in upper respiratory specimens during the acute phase of infection. The lowest concentration of SARS-CoV-2 viral copies this assay can detect is 138 copies/mL. A negative result does not preclude SARS-Cov-2 infection and should not be used as the sole basis for treatment or other patient management decisions. A negative result may occur with  improper specimen collection/handling, submission of specimen other than nasopharyngeal swab, presence of viral mutation(s) within the areas targeted by this assay, and inadequate number of viral copies(<138 copies/mL). A negative result must be combined with clinical observations, patient history, and epidemiological information. The expected result is Negative.  Fact Sheet for Patients:  BloggerCourse.com  Fact Sheet for Healthcare Providers:  SeriousBroker.it  This test is no t yet approved or cleared by the [place] FDA and  has been authorized for detection and/or diagnosis of SARS-CoV-2 by FDA under an Emergency Use Authorization (EUA). This EUA will remain  in effect (meaning this test can be used) for the duration of the COVID-19  declaration under Section 564(b)(1) of the Act, 21 U.S.C.section 360bbb-3(b)(1), unless the authorization is terminated  or revoked sooner.       Influenza A by PCR NEGATIVE NEGATIVE Final   Influenza B by PCR NEGATIVE NEGATIVE Final    Comment: (NOTE) The Xpert Xpress SARS-CoV-2/FLU/RSV plus assay is intended as an aid in the diagnosis of influenza from Nasopharyngeal swab specimens and should not be used as a sole basis for treatment. Nasal washings and aspirates are unacceptable for Xpert Xpress SARS-CoV-2/FLU/RSV testing.  Fact Sheet for Patients: BloggerCourse.com  Fact Sheet for Healthcare  Providers: SeriousBroker.it  This test is not yet approved or cleared by the [place] FDA and has been authorized for detection and/or diagnosis of SARS-CoV-2 by FDA under an Emergency Use Authorization (EUA). This EUA will remain in effect (meaning this test can be used) for the duration of the COVID-19 declaration under Section 564(b)(1) of the Act, 21 U.S.C. section 360bbb-3(b)(1), unless the authorization is terminated or revoked.  Performed at [facility], [address], [place], [place] [address]         Radiology Studies: No results found.      Scheduled Meds:  buPROPion  150 mg Oral Daily   busPIRone  5 mg Oral BID   cyanocobalamin  1,000 mcg Intramuscular Weekly   enoxaparin (LOVENOX) injection  50 mg Subcutaneous Q12H   feeding supplement  237 mL Oral TID BM   folic acid  1 mg Oral Daily   furosemide  40 mg Intravenous BID   hydrocortisone  25 mg Rectal BID   lidocaine  1 patch Transdermal Daily   magic mouthwash w/lidocaine  5 mL Oral TID AC   metoprolol tartrate  25 mg Oral BID   multivitamin with minerals  1 tablet Oral Daily   pantoprazole  40 mg Oral BID   polyethylene glycol  17 g Oral Daily   pregabalin  75 mg Oral TID   senna-docusate  1 tablet Oral BID   sodium chloride flush  3 mL Intravenous Q12H   Continuous Infusions:  sodium chloride 10 mL/hr at 01/07/21 1710     LOS: 35 days    Marcellus Scott, MD, [place], [facility]. [facility]  To contact the attending provider between 7A-7P or the covering provider during after hours 7P-7A, please log into the web site [email or web address] and access using universal [place] password for that web site. If you do not have the password, please call the hospital operator.

## 2021-01-15 LAB — BASIC METABOLIC PANEL
Anion gap: 8 (ref 5–15)
BUN: 9 mg/dL (ref 6–20)
CO2: 35 mmol/L — ABNORMAL HIGH (ref 22–32)
Calcium: 9.1 mg/dL (ref 8.9–10.3)
Chloride: 90 mmol/L — ABNORMAL LOW (ref 98–111)
Creatinine, Ser: 0.36 mg/dL — ABNORMAL LOW (ref 0.44–1.00)
GFR, Estimated: >60 mL/min (ref >60)
Glucose, Bld: 112 mg/dL — ABNORMAL HIGH (ref 70–99)
Potassium: 3.2 mmol/L — ABNORMAL LOW (ref 3.5–5.1)
Sodium: 133 mmol/L — ABNORMAL LOW (ref 135–145)

## 2021-01-15 MED ORDER — POTASSIUM CHLORIDE CRYS ER 20 MEQ PO TBCR: 30.0000 meq | EXTENDED_RELEASE_TABLET | Freq: Two times a day (BID) | ORAL | Status: DC

## 2021-01-15 MED ORDER — ALUM & MAG HYDROXIDE-SIMETH 200-200-20 MG/5ML PO SUSP: 30.0000 mL | Freq: Four times a day (QID) | ORAL | Status: DC | PRN

## 2021-01-15 MED ORDER — ALBUMIN HUMAN 25 % IV SOLN
25.0000 g | Freq: Once | INTRAVENOUS | Status: AC
  Administered 2021-01-15: 25 g via INTRAVENOUS
  Filled 2021-01-15: qty 100

## 2021-01-15 MED ORDER — PREGABALIN 50 MG PO CAPS
50.0000 mg | ORAL_CAPSULE | Freq: Three times a day (TID) | ORAL | Status: DC
  Administered 2021-01-15 – 2021-01-17 (×6): 50 mg via ORAL
  Filled 2021-01-15 (×7): qty 1

## 2021-01-15 MED ORDER — OXYCODONE HCL 5 MG PO TABS
10.0000 mg | ORAL_TABLET | Freq: Once | ORAL | Status: AC
Start: 2021-01-15 — End: 2021-01-15
  Administered 2021-01-15: 10 mg via ORAL
  Filled 2021-01-15: qty 2

## 2021-01-15 MED ORDER — PROCHLORPERAZINE EDISYLATE 10 MG/2ML IJ SOLN
10.0000 mg | Freq: Four times a day (QID) | INTRAMUSCULAR | Status: AC | PRN
Start: 2021-01-15 — End: 2021-01-16
  Administered 2021-01-15 – 2021-01-16 (×2): 10 mg via INTRAVENOUS
  Filled 2021-01-15 (×2): qty 2

## 2021-01-15 MED ORDER — POTASSIUM CHLORIDE CRYS ER 20 MEQ PO TBCR
40.0000 meq | EXTENDED_RELEASE_TABLET | Freq: Two times a day (BID) | ORAL | Status: DC
  Administered 2021-01-15 – 2021-01-26 (×23): 40 meq via ORAL
  Filled 2021-01-15 (×24): qty 2

## 2021-01-15 NOTE — Progress Notes (Signed)
NIF= -40 ( best of 3) FVC= 1.3L (best of 3)

## 2021-01-15 NOTE — Consult Note (Signed)
I   [facility] Face-to-Face Psychiatry Consult   Reason for Consult: Assistance with medication management.  Prolonged hospital stay.  Anxiety and depression.  Very morbid obesity.  Remeron stopped due to concern for weight gain. Referring Physician:  Dr. Roselee Nova  Patient Identification: Carla Little MRN:  [id number] Principal Diagnosis: Intractable vomiting with nausea Diagnosis:  Principal Problem:   Intractable vomiting with nausea Active Problems:   Left knee dislocation   PUD (peptic ulcer disease)   Nonalcoholic steatohepatitis (NASH)   Class 3 obesity (HCC)   Sinus tachycardia   Hypokalemia   GERD (gastroesophageal reflux disease)   Iron deficiency anemia due to chronic blood loss   E. coli UTI   Guillain Barr syndrome (HCC)   Total Time spent with patient: 15 minutes  Subjective:   Carla Little is a 30 y.o. female patient admitted with nausea and vomiting, myalgia and worsening b generalized pain.  pt was found to have albuminocytologic dissociation and diagnosed with Guillain-Barre syndrome. Psychiatry was consulted previously during hospitalization and  recommended buspar 5 mg BID for gastric emptying and anxiety. Psychiatry was re-consulted for continued low mood and 10/22 and recommended wellbutrin 150 mg XL.   Patient seen and chart reviewed- no acute events overnight. Patient interviewed bedside this afternoon. Patient appeared to be sleeping upon entering room but was easily awakened and amenable to participate in interview. Today she describes her mood as " not too good" which she attributes to experiencing nausea and vomiting yesterday which led to poor sleep. She denies SI/HI/AVH. She denies any noticeable SE/AE to starting wellbutrin and is agreeable to continue at this time . Patient has expressed concern and hesitancy with psychotropic medications due to lexapro causing her to feel "like a zombie" in the past.       HPI:  Patient is a 30 year old female  with morbid obesity, nonalcoholic fatty liver disease, GERD, depression, recently was hospitalized following a mechanical fall with a left knee dislocation, resultant MCL strain, ACL and PCL rupture then complicated by nausea vomiting, tachycardia, UTI and fecal impaction during the hospitalization. Patient had an endoscopy which noted small antral ulcer on 8/27, RUQ ultrasound was unremarkable.  Patient presented to ED with multiple episodes of nausea, vomiting, aches and pains everywhere. In ED, afebrile, mildly tachycardic, hypertensive.   Past Psychiatric History: History of depression and suicidal ideations, last admitted to OV in 2020. She denies any actual suicide attempts. She currently has no outpatient psychiatric providers, but is interested in services. Denies any self harm behaviors.   Risk to Self: Denies Risk to Others:  Denies Prior Inpatient Therapy:   [facility] 2020, for suicidal ideations and depression Prior Outpatient Therapy:    Past Medical History:  Past Medical History:  Diagnosis Date   Class 3 obesity (HCC) 12/09/2020   Depression    GERD (gastroesophageal reflux disease)    Nonalcoholic steatohepatitis (NASH) 12/09/2020   Obesity    PUD (peptic ulcer disease) 12/09/2020    Past Surgical History:  Procedure Laterality Date   BIOPSY  11/19/2020   Procedure: BIOPSY;  Surgeon: Charna Elizabeth, MD;  Location: [facility];  Service: Endoscopy;;   ESOPHAGOGASTRODUODENOSCOPY N/A 01/10/2021   Procedure: ESOPHAGOGASTRODUODENOSCOPY (EGD);  Surgeon: Kathi Der, MD;  Location: [facility];  Service: Gastroenterology;  Laterality: N/A;   ESOPHAGOGASTRODUODENOSCOPY (EGD) WITH PROPOFOL N/A 11/19/2020   Procedure: ESOPHAGOGASTRODUODENOSCOPY (EGD) WITH PROPOFOL;  Surgeon: Charna Elizabeth, MD;  Location: [facility];  Service: Endoscopy;  Laterality: N/A;   FLEXIBLE SIGMOIDOSCOPY  N/A 01/10/2021   Procedure: FLEXIBLE SIGMOIDOSCOPY;  Surgeon: Kathi Der, MD;   Location: [facility];  Service: Gastroenterology;  Laterality: N/A;   Family History:  Family History  Problem Relation Age of Onset   Hypertension Other    Family Psychiatric  History: Denies Social History:  Social History   Substance and Sexual Activity  Alcohol Use Yes     Social History   Substance and Sexual Activity  Drug Use No    Social History   Socioeconomic History   Marital status: Single    Spouse name: Not on file   Number of children: Not on file   Years of education: Not on file   Highest education level: Not on file  Occupational History   Not on file  Tobacco Use   Smoking status: Never   Smokeless tobacco: Never  Substance and Sexual Activity   Alcohol use: Yes   Drug use: No   Sexual activity: Not on file  Other Topics Concern   Not on file  Social History Narrative   Not on file   Social Determinants of Health   Financial Resource Strain: Not on file  Food Insecurity: Not on file  Transportation Needs: Not on file  Physical Activity: Not on file  Stress: Not on file  Social Connections: Not on file   Additional Social History:    Allergies:   Allergies  Allergen Reactions   Azithromycin Shortness Of Breath and Nausea And Vomiting    Labs:  Results for orders placed or performed during the hospital encounter of 12/09/20 (from the past 48 hour(s))  Basic metabolic panel     Status: Abnormal   Collection Time: 01/14/21  6:04 AM  Result Value Ref Range   Sodium 133 (L) 135 - 145 mmol/L   Potassium 4.0 3.5 - 5.1 mmol/L   Chloride 93 (L) 98 - 111 mmol/L   CO2 35 (H) 22 - 32 mmol/L   Glucose, Bld 100 (H) 70 - 99 mg/dL    Comment: Glucose reference range applies only to samples taken after fasting for at least 8 hours.   BUN 7 6 - 20 mg/dL   Creatinine, Ser 7.06 (L) 0.44 - 1.00 mg/dL   Calcium 8.9 8.9 - 23.7 mg/dL   GFR, Estimated >62 >83 mL/min    Comment: (NOTE) Calculated using the CKD-EPI Creatinine Equation (2021)     Anion gap 5 5 - 15    Comment: Performed at [facility], [address], [place], [place] [address]  Basic metabolic panel     Status: Abnormal   Collection Time: 01/15/21  5:32 AM  Result Value Ref Range   Sodium 133 (L) 135 - 145 mmol/L   Potassium 3.2 (L) 3.5 - 5.1 mmol/L    Comment: DELTA CHECK NOTED   Chloride 90 (L) 98 - 111 mmol/L   CO2 35 (H) 22 - 32 mmol/L   Glucose, Bld 112 (H) 70 - 99 mg/dL    Comment: Glucose reference range applies only to samples taken after fasting for at least 8 hours.   BUN 9 6 - 20 mg/dL   Creatinine, Ser 1.60 (L) 0.44 - 1.00 mg/dL   Calcium 9.1 8.9 - 73.7 mg/dL   GFR, Estimated >10 >62 mL/min    Comment: (NOTE) Calculated using the CKD-EPI Creatinine Equation (2021)    Anion gap 8 5 - 15    Comment: Performed at [facility], [address] [place], [place]  [address]    Current Facility-Administered Medications  Medication Dose Route Frequency Provider Last Rate Last Admin   0.9 %  sodium chloride infusion   Intravenous PRN Luiz Iron, NP 10 mL/hr at 01/07/21 1710 Infusion Verify at 01/07/21 1710   acetaminophen (TYLENOL) tablet 650 mg  650 mg Oral Q6H PRN Bobette Mo, MD   650 mg at 01/02/21 6045   Or   acetaminophen (TYLENOL) suppository 650 mg  650 mg Rectal Q6H PRN Bobette Mo, MD       buPROPion (WELLBUTRIN XL) 24 hr tablet 150 mg  150 mg Oral Daily Estella Husk, MD   150 mg at 01/15/21 1005   busPIRone (BUSPAR) tablet 5 mg  5 mg Oral BID Maryagnes Amos, FNP   5 mg at 01/15/21 1004   cyanocobalamin ((VITAMIN B-12)) injection 1,000 mcg  1,000 mcg Intramuscular Weekly Rejeana Brock, MD   1,000 mcg at 01/15/21 1006   diphenhydrAMINE (BENADRYL) 12.5 MG/5ML elixir 12.5 mg  12.5 mg Oral Q6H PRN Narda Bonds, MD   12.5 mg at 01/08/21 0824   enoxaparin (LOVENOX) injection 50 mg  50 mg Subcutaneous Q12H Myrtie Neither, MD   50 mg at 01/15/21 1214   feeding  supplement (ENSURE ENLIVE / ENSURE PLUS) liquid 237 mL  237 mL Oral TID BM Narda Bonds, MD   237 mL at 01/15/21 1005   folic acid (FOLVITE) tablet 1 mg  1 mg Oral Daily Danford Bad, RPH   1 mg at 01/15/21 1005   furosemide (LASIX) injection 40 mg  40 mg Intravenous BID Marcellus Scott D, MD   40 mg at 01/15/21 1034   hydrocortisone (ANUSOL-HC) suppository 25 mg  25 mg Rectal BID Narda Bonds, MD   25 mg at 01/15/21 1007   lidocaine (LIDODERM) 5 % 1 patch  1 patch Transdermal Daily Pokhrel, Laxman, MD   1 patch at 01/15/21 1007   magic mouthwash w/lidocaine  5 mL Oral TID AC Rai, Ripudeep K, MD   5 mL at 01/15/21 1215   menthol-cetylpyridinium (CEPACOL) lozenge 3 mg  1 lozenge Oral PRN Audrea Muscat T, NP   3 mg at 12/14/20 1855   methocarbamol (ROBAXIN) tablet 750 mg  750 mg Oral Q6H PRN Narda Bonds, MD   750 mg at 01/15/21 0650   metoprolol tartrate (LOPRESSOR) tablet 25 mg  25 mg Oral BID Myrtie Neither, MD   25 mg at 01/15/21 1004   multivitamin with minerals tablet 1 tablet  1 tablet Oral Daily Elease Etienne, MD   1 tablet at 01/15/21 1004   ondansetron (ZOFRAN-ODT) disintegrating tablet 4 mg  4 mg Oral Q6H PRN Narda Bonds, MD   4 mg at 01/15/21 1005   Or   ondansetron (ZOFRAN) injection 4 mg  4 mg Intravenous Q6H PRN Narda Bonds, MD   4 mg at 01/13/21 1353   oxyCODONE (Oxy IR/ROXICODONE) immediate release tablet 10 mg  10 mg Oral Once Marcellus Scott D, MD       oxyCODONE (ROXICODONE) 5 MG/5ML solution 5-10 mg  5-10 mg Oral Q4H PRN Narda Bonds, MD   10 mg at 01/15/21 0930   pantoprazole (PROTONIX) EC tablet 40 mg  40 mg Oral BID Danford Bad, RPH   40 mg at 01/15/21 1004   phenol (CHLORASEPTIC) mouth spray 1 spray  1 spray Mouth/Throat PRN Rai, Delene Ruffini, MD       polyethylene  glycol (MIRALAX / GLYCOLAX) packet 17 g  17 g Oral Daily Boone Master M, PA-C   17 g at 01/15/21 1007   potassium chloride SA (KLOR-CON) CR tablet 40 mEq  40 mEq Oral BID  Marcellus Scott D, MD   40 mEq at 01/15/21 1004   pregabalin (LYRICA) capsule 50 mg  50 mg Oral TID Elease Etienne, MD   50 mg at 01/15/21 1004   senna-docusate (Senokot-S) tablet 1 tablet  1 tablet Oral BID Rodolph Bong, MD   1 tablet at 01/15/21 1004   sodium chloride flush (NS) 0.9 % injection 3 mL  3 mL Intravenous Q12H Narda Bonds, MD   3 mL at 01/15/21 1008    Musculoskeletal: Strength & Muscle Tone: within normal limits Gait & Station: normal Patient leans: N/A            Psychiatric Specialty Exam:  Presentation  General Appearance: Appropriate for Environment; Casual  Eye Contact:Good  Speech:Clear and Coherent; Normal Rate  Speech Volume:Normal  Handedness:Right   Mood and Affect  Mood:-- ("not too good")  Affect:Appropriate; Congruent   Thought Process  Thought Processes:Coherent; Goal Directed; Linear  Descriptions of Associations:Intact  Orientation:Full (Time, Place and Person)  Thought Content:Logical  History of Schizophrenia/Schizoaffective disorder:No data recorded Duration of Psychotic Symptoms:No data recorded Hallucinations:Hallucinations: None  Ideas of Reference:None  Suicidal Thoughts:Suicidal Thoughts: No  Homicidal Thoughts:Homicidal Thoughts: No   Sensorium  Memory:Immediate Good; Recent Good; Remote Good  Judgment:Good  Insight:Good   Executive Functions  Concentration:Good  Attention Span:Good  Recall:Good  Fund of Knowledge:Good  Language:Good   Psychomotor Activity  Psychomotor Activity:Psychomotor Activity: Normal   Assets  Assets:Communication Skills; Desire for Improvement; Social Support   Sleep  Sleep:Sleep: Poor   Physical Exam: Physical Exam Vitals and nursing note reviewed.  Constitutional:      General: She is awake.     Appearance: She is obese.  HENT:     Head: Normocephalic.  Neurological:     Mental Status: She is alert and oriented to person, place, and time.   Psychiatric:        Mood and Affect: Mood is depressed.        Behavior: Behavior normal. Behavior is cooperative.        Thought Content: Thought content normal.        Judgment: Judgment normal.   Review of Systems  Neurological:  Positive for weakness.  Psychiatric/Behavioral:  Positive for depression. Negative for hallucinations, memory loss, substance abuse and suicidal ideas. The patient is nervous/anxious. The patient does not have insomnia.   All other systems reviewed and are negative. Blood pressure 123/72, pulse (!) 118, temperature 97.7 F (36.5 C), temperature source Oral, resp. rate 20, height 5' 4.02" (1.626 m), weight (!) 194.5 kg, last menstrual period 12/09/2020, SpO2 99 %. Body mass index is 73.57 kg/m.  Treatment Plan Summary: 30  yo female with a history of depression and anxiety who is currently admitted with guillian barre syndrome. Psychiatry consulted previously in hospitalization for assistance and recommended buspar 5 mg BID. Psychiatry re-consulted 10/21 for continued low mood. Patient tolerated wellbutrin 150 mg XL without any immediate issues. No changes at this time   Recommendations: -continue buspar 5 mg BID for anxiety and gastric emptying -continue wellbutrin 150 mg XL daily for mood -continue visits by chaplain as appears to be helpful for patient's mood and morale with prolonged hospitalization.   -Patient will be a great candidate for intensive outpatient, once  medically stable. -Consult for inpatient rehab has been placed, patient will also receive some neuropsychological services if accepted to CIR.   -discussed updated medication recommendations with primary team via secure chat and ordered medications as per request.   - Psychiatry will follow up in a couple days to see how patient is tolerating medication and likely sign off.  Disposition: No evidence of imminent risk to self or others at present.   Patient does not meet criteria for  psychiatric inpatient admission. Supportive therapy provided about ongoing stressors. Refer to IOP.  Estella Husk, MD Attending psychiatrist 01/15/2021 2:32 PM

## 2021-01-15 NOTE — Progress Notes (Signed)
   01/15/21 0549  Assess: MEWS Score  Temp 98.4 F (36.9 C)  BP 116/87  Pulse Rate (!) 117  Resp 20  Level of Consciousness Alert  SpO2 95 %  O2 Device Nasal Cannula  Assess: MEWS Score  MEWS Temp 0  MEWS Systolic 0  MEWS Pulse 2  MEWS RR 0  MEWS LOC 0  MEWS Score 2  MEWS Score Color Yellow  Assess: if the MEWS score is Yellow or Red  Were vital signs taken at a resting state? Yes  Focused Assessment No change from prior assessment  Does the patient meet 2 or more of the SIRS criteria? Yes  Does the patient have a confirmed or suspected source of infection? No  Provider and Rapid Response Notified? No  MEWS guidelines implemented *See Row Information* Yes  Treat  MEWS Interventions Escalated (See documentation below)  Pain Scale 0-10  Pain Score 2  Escalate  MEWS: Escalate Yellow: discuss with charge nurse/RN and consider discussing with provider and RRT  Notify: Charge Nurse/RN  Name of Charge Nurse/RN Notified Debbie RN  Date Charge Nurse/RN Notified 01/15/21  Time Charge Nurse/RN Notified [phone]  Document  Patient Outcome Stabilized after interventions  Progress note created (see row info) Yes  Assess: SIRS CRITERIA  SIRS Temperature  0  SIRS Pulse 1  SIRS Respirations  0  SIRS WBC 0  SIRS Score Sum  1

## 2021-01-15 NOTE — Progress Notes (Signed)
PROGRESS NOTE    Carla Little  YKD:[id number] DOB: 12/28/1990 DOA: 12/09/2020 PCP: Patient, No Pcp Per (Inactive)   Brief Narrative: Carla Little is a 30 y.o. female with a history of morbid obesity, recent left knee dislocation. Patient presented secondary to weakness with presentation concerning for Guillain Barre syndrome. Neurology consulted and patient started on IVIG. She was also found to have vitamin B12 and folate deficiencies. Patient is now awaiting rehab options for discharge.  Developed nausea, vomiting, rectal pain/abnormality on CT, [facility] consulted and s/p colonoscopy and EGD 10/18.  Multifactorial anasarca, treating with IV Lasix.   Assessment & Plan:   Principal Problem:   Intractable vomiting with nausea Active Problems:   Left knee dislocation   PUD (peptic ulcer disease)   Nonalcoholic steatohepatitis (NASH)   Class 3 obesity (HCC)   Sinus tachycardia   Hypokalemia   GERD (gastroesophageal reflux disease)   Iron deficiency anemia due to chronic blood loss   E. coli UTI   Guillain Barr syndrome (HCC)   Guillain Barre syndrome Neuropathy Neurology consulted and completed IVIG x5 days as per recommendations. Also recommended treating confounding diagnoses of vitamin B12 and folate deficiency.  -As per recommendations by Dr. Carlis Abbott, [facility], will reduce Lyrica from 75 Mg to 50 Mg 3 times daily and monitor since this also can contribute to swelling and not sure if it is helping her. -Neurology (signed off 10/2): Vitamin B12 qweek for 4 weeks, then monthly Vitamin B12 injections. - Consider outpatient follow-up with neurology. - Dr. Alease Frame from [facility] input 10/22 appreciated.  Recommended reducing Lyrica which can contribute to swelling and adding IV albumin for anasarca. -As per nursing, PT input and mild observation, patient is very motivated to get better and has been participating in therapies and making gradual progress.  Reportedly was  able to sit up at edge of bed along with therapy/nursing on 10/21.  Intractable nausea and vomiting Now resolved. [name] consulted, s/p EGD 10/18 without any acute abnormalities noted. - Per nursing, had an episode of vomiting this morning.  Rectal inflammation Seen on CT imaging. Rectal thickening extending into rectosigmoid junction. Patient does mention pain with having bowel movements/wiping but otherwise did not have symptoms. She reports history of no anal related sexual activity. No systemic symptoms concerning for infection. -GI consulted. Anusol suppositories, s/p flexible sigmoidoscopy which was not an adequate prep but did not show any significant abnormalities.  May need a repeat study as outpatient.  I discussed with Dr.Brahmbhatt, [facility] on 10/19 and he suggested that there was no need for repeating the sigmoidoscopy unless she develops rectal bleeding.  He did not see any major lesions.  Depression/anxiety Evaluated by psychiatry -Continue Buspar 5 mg BID - Given her very morbid obesity and risk of weight gain, Remeron which was started during this admission was discontinued.  Lexapro was started only for a day which patient was reluctant to take because she has taken it in the past and made her feel like a zombie.  She had been on BuSpar, Lexapro and as needed Vistaril in the past per her report.  Also Lexapro had made her eat a lot. - Thereby consulted psychiatry who recommended discontinuing Lexapro.  Wellbutrin XL 150 mg initiated 10/22, patient tolerating thus far, continuing.  Per psychiatry follow-up today, will follow-up again in a couple of days.  Shortness of breath Transient. Resolved.  Left knee dislocation Patient previously evaluated by orthopedic surgery with recommendation for hinge  brace and to allow for joint scarring. Currently weight bearing as tolerated with limited flexion of left knee.  Sinus tachycardia This has been managed with metoprolol. Unsure  of etiology. Possibly related to deconditioning. No concerning symptoms.  Iron deficiency anemia Related to heavy menstruation. Hemoglobin stable in the 9 g range..  Elevated AST/ALT NAFLD Hyperbilirubinemia AST/ALT with initial trend upwards. No associated symptoms. Abdominal ultrasound (10/11) significant for fatty liver disease and cholelithiasis without cholecystitis. INR normal. Bilirubin stable and AST/ALT now stable but elevated. GI consulted. -AST and ALT stable.  Periodic follow-up with labs and follow-up as outpatient.  B12 deficiency Folate deficiency B12 and folate of 153 and 3.2 respectively. Patient started on Vitamin B12 and folic acid supplementation  E. Coli UTI Completed treatment with Ceftriaxone and transitioned to Cefazolin.  Overnight hypoxia Likely patient has OSA and/or OHS judging by symptoms and body habitus. Currently managed with oxygen overnight while asleep. CT abdomen/pelvis significant for evidence of hypoventilation. Attempted to trial CPAP but patient declined treatment. Nocturnal oxygen saturation on room air showed hypoxia in the 80s and was placed back on oxygen.  Hypocalcemia Mild and likely related to hypoalbuminemia.  Hypokalemia -Potassium supplementation as needed  LE edema/anasarca Possibly related to hypoalbuminemia. No evidence of kidney or heart impairment. Liver does have some disease; normal bilirubin. No ascites on ultrasound. Significant bilateral upper extremity pitting edema.  DC IV fluids. - Still has significant third spacing/anasarca which is gradually improving.  Still with significant edema.  Renal panel looks okay.  Increase Lasix to 40 mg IV twice daily.  Also added potassium 20 M EQ daily.  Added IV albumin 25 g today to assist with third space fluid mobilization.  Bilateral leg pain Low suspicion for acute DVT, however pain is apparently new onset. Difficult to assess from examination secondary to body habitus. Venous  duplex was negative for acute DVT, bilaterally.  Possible oral candidiasis Treated with Diflucan IV.  Resolved.  Positive blood culture result 1/4 samples significant for staphylococcus epidermidis. Unlikely active infection.  Morbid obesity Body mass index is 73.57 kg/m. Dietitian consulted. -Dietitian recommendations (10/12): Liberalize diet to regular to promote oral intake Will request food be chopped into bite sized pieces and dining services alert nursing staff when tray is delivered Nursing staff to assist with set-up and feeeding of meals Increase Ensure Enlive po to TID, each supplement provides 350 kcal and 20 grams of protein Continue MVI regimen   DVT prophylaxis: Lovenox Code Status:   Code Status: Full Code Family Communication: None at bedside Disposition Plan: DC to SNF pending bed.  Discussed with TOC during morning rounds.   Consultants:  Neurology Psychiatry Gastroenterology  Procedures:  None  Antimicrobials: Ceftriaxone Ancef Diflucan Flagyl    Subjective: States that her lower body more than upper body swelling seems to be improving.  After had seen her, RN reported that she had an episode of nausea and vomiting.  Objective: Vitals:   01/15/21 0700 01/15/21 0759 01/15/21 0949 01/15/21 1400  BP:  131/84 123/72 132/75  Pulse: (!) 115 (!) 119 (!) 118 (!) 109  Resp: 20 19 20 18   Temp:  98.4 F (36.9 C) 97.7 F (36.5 C) (!) 97.5 F (36.4 C)  TempSrc:   Oral   SpO2: 95% 96% 99% 98%  Weight:      Height:        Intake/Output Summary (Last 24 hours) at 01/15/2021 1500 Last data filed at 01/15/2021 1400 Gross per 24 hour  Intake 480  ml  Output 4000 ml  Net -3520 ml    Filed Weights   01/12/21 0600 01/13/21 0506 01/14/21 0500  Weight: (!) 228 kg (!) 191.5 kg (!) 194.5 kg    Examination:  General exam: Pleasant young female, very morbidly obese, lying comfortably propped up in bed without distress.   Respiratory system: Clear to  auscultation.  No increased work of breathing. Cardiovascular system: S1 & S2 heard, RRR. No murmurs, rubs, gallops or clicks.  Hard to clearly appreciate JVD.  Upper extremity edema is decreasing but still quite a bit.  Now also seen 1+ pitting bilateral leg edema. Gastrointestinal system: Abdomen is nondistended, soft and nontender. No organomegaly or masses felt. Normal bowel sounds heard. Central nervous system: Alert and oriented. No focal neurological deficits. Musculoskeletal: Moves all extremities symmetrically Skin: No cyanosis. No rashes Psychiatry: Judgement and insight appear normal.  Appeared to be in good spirits when I saw her this morning.  Data Reviewed: I have personally reviewed following labs and imaging studies  CBC Lab Results  Component Value Date   WBC 8.8 01/11/2021   RBC 3.05 (L) 01/11/2021   HGB 9.0 (L) 01/11/2021   HCT 30.3 (L) 01/11/2021   MCV 99.3 01/11/2021   MCH 29.5 01/11/2021   PLT 491 (H) 01/11/2021   MCHC 29.7 (L) 01/11/2021   RDW 20.5 (H) 01/11/2021   LYMPHSABS 2.0 01/03/2021   MONOABS 0.9 01/03/2021   EOSABS 0.0 01/03/2021   BASOSABS 0.0 01/03/2021     Last metabolic panel Lab Results  Component Value Date   NA 133 (L) 01/15/2021   K 3.2 (L) 01/15/2021   CL 90 (L) 01/15/2021   CO2 35 (H) 01/15/2021   BUN 9 01/15/2021   CREATININE 0.36 (L) 01/15/2021   GLUCOSE 112 (H) 01/15/2021   GFRNONAA >60 01/15/2021   GFRAA >60 05/16/2018   CALCIUM 9.1 01/15/2021   PHOS 2.9 12/14/2020   PROT 8.1 01/12/2021   ALBUMIN 2.9 (L) 01/12/2021   BILITOT 1.7 (H) 01/12/2021   ALKPHOS 76 01/12/2021   AST 360 (H) 01/12/2021   ALT 127 (H) 01/12/2021   ANIONGAP 8 01/15/2021    CBG (last 3)  No results for input(s): GLUCAP in the last 72 hours.   GFR: Estimated Creatinine Clearance: 176.3 mL/min (A) (by C-G formula based on SCr of 0.36 mg/dL (L)).  Coagulation Profile: No results for input(s): INR, PROTIME in the last 168 hours.   Recent Results  (from the past 240 hour(s))  Resp Panel by RT-PCR (Flu A&B, Covid) Nasopharyngeal Swab     Status: None   Collection Time: 01/06/21 10:09 AM   Specimen: Nasopharyngeal Swab; Nasopharyngeal(NP) swabs in vial transport medium  Result Value Ref Range Status   SARS Coronavirus 2 by RT PCR NEGATIVE NEGATIVE Final    Comment: (NOTE) SARS-CoV-2 target nucleic acids are NOT DETECTED.  The SARS-CoV-2 RNA is generally detectable in upper respiratory specimens during the acute phase of infection. The lowest concentration of SARS-CoV-2 viral copies this assay can detect is 138 copies/mL. A negative result does not preclude SARS-Cov-2 infection and should not be used as the sole basis for treatment or other patient management decisions. A negative result may occur with  improper specimen collection/handling, submission of specimen other than nasopharyngeal swab, presence of viral mutation(s) within the areas targeted by this assay, and inadequate number of viral copies(<138 copies/mL). A negative result must be combined with clinical observations, patient history, and epidemiological information. The expected result is Negative.  Fact Sheet for Patients:  BloggerCourse.com  Fact Sheet for Healthcare Providers:  SeriousBroker.it  This test is no t yet approved or cleared by the [place] FDA and  has been authorized for detection and/or diagnosis of SARS-CoV-2 by FDA under an Emergency Use Authorization (EUA). This EUA will remain  in effect (meaning this test can be used) for the duration of the COVID-19 declaration under Section 564(b)(1) of the Act, 21 U.S.C.section 360bbb-3(b)(1), unless the authorization is terminated  or revoked sooner.       Influenza A by PCR NEGATIVE NEGATIVE Final   Influenza B by PCR NEGATIVE NEGATIVE Final    Comment: (NOTE) The Xpert Xpress SARS-CoV-2/FLU/RSV plus assay is intended as an aid in the  diagnosis of influenza from Nasopharyngeal swab specimens and should not be used as a sole basis for treatment. Nasal washings and aspirates are unacceptable for Xpert Xpress SARS-CoV-2/FLU/RSV testing.  Fact Sheet for Patients: BloggerCourse.com  Fact Sheet for Healthcare Providers: SeriousBroker.it  This test is not yet approved or cleared by the [place] FDA and has been authorized for detection and/or diagnosis of SARS-CoV-2 by FDA under an Emergency Use Authorization (EUA). This EUA will remain in effect (meaning this test can be used) for the duration of the COVID-19 declaration under Section 564(b)(1) of the Act, 21 U.S.C. section 360bbb-3(b)(1), unless the authorization is terminated or revoked.  Performed at [facility], [address], [place], [place] [address]         Radiology Studies: No results found.      Scheduled Meds:  buPROPion  150 mg Oral Daily   busPIRone  5 mg Oral BID   cyanocobalamin  1,000 mcg Intramuscular Weekly   enoxaparin (LOVENOX) injection  50 mg Subcutaneous Q12H   feeding supplement  237 mL Oral TID BM   folic acid  1 mg Oral Daily   furosemide  40 mg Intravenous BID   hydrocortisone  25 mg Rectal BID   lidocaine  1 patch Transdermal Daily   magic mouthwash w/lidocaine  5 mL Oral TID AC   metoprolol tartrate  25 mg Oral BID   multivitamin with minerals  1 tablet Oral Daily   oxyCODONE  10 mg Oral Once   pantoprazole  40 mg Oral BID   polyethylene glycol  17 g Oral Daily   potassium chloride  40 mEq Oral BID   pregabalin  50 mg Oral TID   senna-docusate  1 tablet Oral BID   sodium chloride flush  3 mL Intravenous Q12H   Continuous Infusions:  sodium chloride 10 mL/hr at 01/07/21 1710     LOS: 36 days    Marcellus Scott, MD, [place]. [facility]  To contact the attending provider between 7A-7P or the covering provider during after hours  7P-7A, please log into the web site [email or web address] and access using universal [place] password for that web site. If you do not have the password, please call the hospital operator.

## 2021-01-16 LAB — COMPREHENSIVE METABOLIC PANEL
ALT: 123 U/L — ABNORMAL HIGH (ref 0–44)
AST: 350 U/L — ABNORMAL HIGH (ref 15–41)
Albumin: 3.5 g/dL (ref 3.5–5.0)
Alkaline Phosphatase: 87 U/L (ref 38–126)
Anion gap: 6 (ref 5–15)
BUN: 9 mg/dL (ref 6–20)
CO2: 40 mmol/L — ABNORMAL HIGH (ref 22–32)
Calcium: 9.4 mg/dL (ref 8.9–10.3)
Chloride: 90 mmol/L — ABNORMAL LOW (ref 98–111)
Creatinine, Ser: <0.3 mg/dL — ABNORMAL LOW (ref 0.44–1.00)
Glucose, Bld: 106 mg/dL — ABNORMAL HIGH (ref 70–99)
Potassium: 3.2 mmol/L — ABNORMAL LOW (ref 3.5–5.1)
Sodium: 136 mmol/L (ref 135–145)
Total Bilirubin: 1.6 mg/dL — ABNORMAL HIGH (ref 0.3–1.2)
Total Protein: 9.1 g/dL — ABNORMAL HIGH (ref 6.5–8.1)

## 2021-01-16 LAB — CBC
HCT: 32.1 % — ABNORMAL LOW (ref 36.0–46.0)
Hemoglobin: 9.5 g/dL — ABNORMAL LOW (ref 12.0–15.0)
MCH: 29.1 pg (ref 26.0–34.0)
MCHC: 29.6 g/dL — ABNORMAL LOW (ref 30.0–36.0)
MCV: 98.2 fL (ref 80.0–100.0)
Platelets: 523 10*3/uL — ABNORMAL HIGH (ref 150–400)
RBC: 3.27 MIL/uL — ABNORMAL LOW (ref 3.87–5.11)
RDW: 19.9 % — ABNORMAL HIGH (ref 11.5–15.5)
WBC: 10.1 10*3/uL (ref 4.0–10.5)
nRBC: 0 % (ref 0.0–0.2)

## 2021-01-16 MED ORDER — FUROSEMIDE 10 MG/ML IJ SOLN
40.0000 mg | Freq: Two times a day (BID) | INTRAMUSCULAR | Status: DC
  Administered 2021-01-17 – 2021-01-26 (×19): 40 mg via INTRAVENOUS
  Filled 2021-01-16 (×20): qty 4

## 2021-01-16 NOTE — Progress Notes (Signed)
Physical Therapy Treatment Patient Details Name: Carla Little MRN: [id number] DOB: November 21, 1990 Today's Date: 01/16/2021   History of Present Illness Patient is a 30 y.o. female who presented to [facility] for intractable N/V on 9/17. ED labwork reveals e. coli UTI. Pt had recent hospital admission from 8/8-9/5 due to Lt knee dislocation which was reduced with fractures of the proximal fibula ligamentous avulsion laterally as well as medially off the medial femoral condyle and MRI showed complete ACL & PCL tears and MCL strain. That hospital admission was complicated by nausea, vomiting, tachycardia, UTI, and fecal impaction;. PMH significant for morbid obesity, GERD, fatty liver disease, depression. Patient now with Guillian-Barre syndrome    PT Comments    Patient reports onset of vomiting. Assisted with rolling to each side x 2 with 2 total assistance.  Patient  with slightly less tight edema of abdomen and  extremities.  Continue  tilting to stand and sitting as tolerated.   Recommendations for follow up therapy are one component of a multi-disciplinary discharge planning process, led by the attending physician.  Recommendations may be updated based on patient status, additional functional criteria and insurance authorization.  Follow Up Recommendations  Skilled nursing-short term rehab (<3 hours/day)     Assistance Recommended at Discharge    Equipment Recommendations  Wheelchair (measurements PT);Wheelchair cushion (measurements PT)    Recommendations for Other Services       Precautions / Restrictions Precautions Precautions: Fall Precaution Comments: LE's remain very weak, not supporting in tilt to stand. Other Brace: does not need KI at this time but may consider since opatient has more weakness due GBS     Mobility  Bed Mobility   Bed Mobility: Rolling Rolling: Max assist;+2 for physical assistance;+2 for safety/equipment         General bed mobility comments: patient   having more pain, more difficulty in reaching for rails when rolling. rolled to each  side x  2 for washup and linens.    Transfers                        Ambulation/Gait                 Stairs             Wheelchair Mobility    Modified Rankin (Stroke Patients Only)       Balance                                            Cognition Arousal/Alertness: Awake/alert Behavior During Therapy: Flat affect                                   General Comments: reports having emesis since yesterday.        Exercises      General Comments        Pertinent Vitals/Pain Faces Pain Scale: Hurts whole lot Pain Descriptors / Indicators: Aching;Sore Pain Intervention(s): Limited activity within patient's tolerance;Monitored during session;Patient requesting pain meds-RN notified    Home Living                          Prior Function            PT Goals (current goals  can now be found in the care plan section) Progress towards PT goals: Progressing toward goals    Frequency    Min 2X/week      PT Plan Current plan remains appropriate    Co-evaluation              AM-PAC PT "6 Clicks" Mobility   Outcome Measure  Help needed turning from your back to your side while in a flat bed without using bedrails?: Total Help needed moving from lying on your back to sitting on the side of a flat bed without using bedrails?: Total Help needed moving to and from a bed to a chair (including a wheelchair)?: Total Help needed standing up from a chair using your arms (e.g., wheelchair or bedside chair)?: Total Help needed to walk in hospital room?: Total Help needed climbing 3-5 steps with a railing? : Total 6 Click Score: 6    End of Session   Activity Tolerance: Patient limited by pain;Treatment limited secondary to medical complications (Comment) Patient left: in bed;with call bell/phone within  reach;with nursing/sitter in room Nurse Communication: Mobility status;Need for lift equipment PT Visit Diagnosis: Muscle weakness (generalized) (M62.81);Other symptoms and signs involving the nervous system (R29.898) Pain - Right/Left: Left Pain - part of body: Knee     Time: 0940-1005 PT Time Calculation (min) (ACUTE ONLY): 25 min  Charges:  $Therapeutic Activity: 23-37 mins                    Blanchard Kelch PT Acute Rehabilitation Services Pager [phone] Office [phone]   Rada Hay 01/16/2021, 4:47 PM

## 2021-01-16 NOTE — Progress Notes (Signed)
PROGRESS NOTE    Carla Little  VEL:[id number] DOB: Dec 28, 1990 DOA: 12/09/2020 PCP: Patient, No Pcp Per (Inactive)   Brief Narrative: Carla Little is a 30 y.o. female with a history of morbid obesity, recent left knee dislocation. Patient presented secondary to weakness with presentation concerning for Guillain Barre syndrome. Neurology consulted and patient started on IVIG. She was also found to have vitamin B12 and folate deficiencies. Patient is now awaiting rehab options for discharge.  Developed nausea, vomiting, rectal pain/abnormality on CT, [facility] consulted and s/p colonoscopy and EGD 10/18.  Multifactorial anasarca, treating with IV Lasix.   Assessment & Plan:   Principal Problem:   Intractable vomiting with nausea Active Problems:   Left knee dislocation   PUD (peptic ulcer disease)   Nonalcoholic steatohepatitis (NASH)   Class 3 obesity (HCC)   Sinus tachycardia   Hypokalemia   GERD (gastroesophageal reflux disease)   Iron deficiency anemia due to chronic blood loss   E. coli UTI   Guillain Barr syndrome (HCC)   Guillain Barre syndrome Neuropathy Neurology consulted and completed IVIG x5 days as per recommendations. Also recommended treating confounding diagnoses of vitamin B12 and folate deficiency.  -As per recommendations by Dr. Carlis Abbott, [facility], will reduce Lyrica from 75 Mg to 50 Mg 3 times daily and monitor since this also can contribute to swelling and not sure if it is helping her. -Neurology (signed off 10/2): Vitamin B12 qweek for 4 weeks, then monthly Vitamin B12 injections. - Consider outpatient follow-up with neurology. - Dr. Alease Frame from [facility] input 10/22 appreciated.  Recommended reducing Lyrica which can contribute to swelling and adding IV albumin for anasarca. -As per nursing, PT input and mild observation, patient is very motivated to get better and has been participating in therapies and making gradual progress.  Reportedly was  able to sit up at edge of bed along with therapy/nursing on 10/21.  Intractable nausea and vomiting -[facility] consulted, s/p EGD 10/18 without any acute abnormalities noted. - Nausea and vomiting had resolved for a few days and then had multiple episodes again on 10/23, unclear etiology. - Better today.  Tolerating her meds and some diet this morning.  Per nursing, Compazine helping her.  Rectal inflammation Seen on CT imaging. Rectal thickening extending into rectosigmoid junction. Patient does mention pain with having bowel movements/wiping but otherwise did not have symptoms. She reports history of no anal related sexual activity. No systemic symptoms concerning for infection. -GI consulted. Anusol suppositories, s/p flexible sigmoidoscopy which was not an adequate prep but did not show any significant abnormalities.  May need a repeat study as outpatient.  I discussed with Dr.Brahmbhatt, [facility] on 10/19 and he suggested that there was no need for repeating the sigmoidoscopy unless she develops rectal bleeding.  He did not see any major lesions.  Depression/anxiety Evaluated by psychiatry -Continue Buspar 5 mg BID - Given her very morbid obesity and risk of weight gain, Remeron which was started during this admission was discontinued.  Lexapro was started only for a day which patient was reluctant to take because she has taken it in the past and made her feel like a zombie.  She had been on BuSpar, Lexapro and as needed Vistaril in the past per her report.  Also Lexapro had made her eat a lot. - Thereby consulted psychiatry who recommended discontinuing Lexapro.  Wellbutrin XL 150 mg initiated 10/22, patient tolerating thus far, continuing.  Per psychiatry follow-up today, will follow-up  again in a couple of days.  Shortness of breath Transient. Resolved.  Left knee dislocation Patient previously evaluated by orthopedic surgery with recommendation for hinge brace and to allow for joint  scarring. Currently weight bearing as tolerated with limited flexion of left knee.  Sinus tachycardia This has been managed with metoprolol. Unsure of etiology. Possibly related to deconditioning. No concerning symptoms.  Iron deficiency anemia Related to heavy menstruation. Hemoglobin stable in the 9 g range..  Elevated AST/ALT NAFLD Hyperbilirubinemia AST/ALT with initial trend upwards. No associated symptoms. Abdominal ultrasound (10/11) significant for fatty liver disease and cholelithiasis without cholecystitis. INR normal. Bilirubin stable and AST/ALT now stable but elevated. GI consulted. -AST and ALT stable.  Periodic follow-up with labs and follow-up as outpatient.  B12 deficiency Folate deficiency B12 and folate of 153 and 3.2 respectively. Patient started on Vitamin B12 and folic acid supplementation  E. Coli UTI Completed treatment with Ceftriaxone and transitioned to Cefazolin.  Overnight hypoxia Likely patient has OSA and/or OHS judging by symptoms and body habitus. Currently managed with oxygen overnight while asleep. CT abdomen/pelvis significant for evidence of hypoventilation. Attempted to trial CPAP but patient declined treatment. Nocturnal oxygen saturation on room air showed hypoxia in the 80s and was placed back on oxygen.  Hypocalcemia Mild and likely related to hypoalbuminemia.  Hypokalemia -Replace and follow potassium.  LE edema/anasarca Possibly related to hypoalbuminemia. No evidence of kidney or heart impairment. Liver does have some disease; normal bilirubin. No ascites on ultrasound. Significant bilateral upper extremity pitting edema.  DC IV fluids. - Treating with IV Lasix 40 mg twice daily.  Also received IV albumin on 10/23. - Due to recent nausea, vomiting, poor oral intake and contraction alkalosis, held IV Lasix for today.  Follow-up BMP in AM.  Bilateral leg pain Low suspicion for acute DVT, however pain is apparently new onset. Difficult  to assess from examination secondary to body habitus. Venous duplex was negative for acute DVT, bilaterally.  Possible oral candidiasis Treated with Diflucan IV.  Resolved.  Positive blood culture result 1/4 samples significant for staphylococcus epidermidis. Unlikely active infection.  Morbid obesity Body mass index is 86.24 kg/m. Dietitian consulted. -Dietitian recommendations (10/12): Liberalize diet to regular to promote oral intake Will request food be chopped into bite sized pieces and dining services alert nursing staff when tray is delivered Nursing staff to assist with set-up and feeeding of meals Increase Ensure Enlive po to TID, each supplement provides 350 kcal and 20 grams of protein Continue MVI regimen   DVT prophylaxis: Lovenox Code Status:   Code Status: Full Code Family Communication: None at bedside Disposition Plan: DC to SNF pending bed.  Discussed with TOC during morning rounds.   Consultants:  Neurology Psychiatry Gastroenterology  Procedures:  None  Antimicrobials: Ceftriaxone Ancef Diflucan Flagyl    Subjective: States that she had 3-4 episodes of nonbilious vomiting yesterday and unable to tolerate diet or meds.  Better this morning.  No nausea vomiting since morning.  Tolerated meds drank some Ensure.  Body swelling slowly improving.  Had BM this morning.  No abdominal pain.  Objective: Vitals:   01/16/21 0335 01/16/21 0500 01/16/21 0915 01/16/21 1504  BP: (!) 139/97  139/89 126/71  Pulse: 98  (!) 109 (!) 108  Resp: 20   20  Temp: 98.9 F (37.2 C)   99 F (37.2 C)  TempSrc: Axillary   Oral  SpO2: 98%   95%  Weight:  (!) 228 kg    Height:  Intake/Output Summary (Last 24 hours) at 01/16/2021 1644 Last data filed at 01/16/2021 0900 Gross per 24 hour  Intake --  Output 950 ml  Net -950 ml    Filed Weights   01/14/21 0500 01/15/21 2059 01/16/21 0500  Weight: (!) 194.5 kg (!) 228 kg (!) 228 kg    Examination:  General  exam: Pleasant young female, very morbidly obese, lying comfortably propped up in bed without distress.  Was watching something on her phone. Respiratory system: Clear to auscultation.  No increased work of breathing. Cardiovascular system: S1 & S2 heard, RRR. No murmurs, rubs, gallops or clicks.  Hard to clearly appreciate JVD.  Upper extremity and lower extremity edema are better but still has some edema.  Hard to determine exactly due to body habitus. Gastrointestinal system: Abdomen is nondistended, soft and nontender.  Normal bowel sounds heard. Central nervous system: Alert and oriented. No focal neurological deficits. Musculoskeletal: Moves all extremities symmetrically Skin: No cyanosis. No rashes Psychiatry: Judgement and insight appear normal.  Appeared to be in good spirits when I saw her this morning.  Data Reviewed: I have personally reviewed following labs and imaging studies  CBC Lab Results  Component Value Date   WBC 10.1 01/16/2021   RBC 3.27 (L) 01/16/2021   HGB 9.5 (L) 01/16/2021   HCT 32.1 (L) 01/16/2021   MCV 98.2 01/16/2021   MCH 29.1 01/16/2021   PLT 523 (H) 01/16/2021   MCHC 29.6 (L) 01/16/2021   RDW 19.9 (H) 01/16/2021   LYMPHSABS 2.0 01/03/2021   MONOABS 0.9 01/03/2021   EOSABS 0.0 01/03/2021   BASOSABS 0.0 01/03/2021     Last metabolic panel Lab Results  Component Value Date   NA 136 01/16/2021   K 3.2 (L) 01/16/2021   CL 90 (L) 01/16/2021   CO2 40 (H) 01/16/2021   BUN 9 01/16/2021   CREATININE <0.30 (L) 01/16/2021   GLUCOSE 106 (H) 01/16/2021   GFRNONAA NOT CALCULATED 01/16/2021   GFRAA >60 05/16/2018   CALCIUM 9.4 01/16/2021   PHOS 2.9 12/14/2020   PROT 9.1 (H) 01/16/2021   ALBUMIN 3.5 01/16/2021   BILITOT 1.6 (H) 01/16/2021   ALKPHOS 87 01/16/2021   AST 350 (H) 01/16/2021   ALT 123 (H) 01/16/2021   ANIONGAP 6 01/16/2021    CBG (last 3)  No results for input(s): GLUCAP in the last 72 hours.   GFR: CrCl cannot be calculated (This  lab value cannot be used to calculate CrCl because it is not a number: <0.30).  Coagulation Profile: No results for input(s): INR, PROTIME in the last 168 hours.   No results found for this or any previous visit (from the past 240 hour(s)).       Radiology Studies: No results found.      Scheduled Meds:  buPROPion  150 mg Oral Daily   busPIRone  5 mg Oral BID   cyanocobalamin  1,000 mcg Intramuscular Weekly   enoxaparin (LOVENOX) injection  50 mg Subcutaneous Q12H   feeding supplement  237 mL Oral TID BM   folic acid  1 mg Oral Daily   [START ON 01/17/2021] furosemide  40 mg Intravenous BID   hydrocortisone  25 mg Rectal BID   lidocaine  1 patch Transdermal Daily   magic mouthwash w/lidocaine  5 mL Oral TID AC   metoprolol tartrate  25 mg Oral BID   multivitamin with minerals  1 tablet Oral Daily   pantoprazole  40 mg Oral BID   polyethylene  glycol  17 g Oral Daily   potassium chloride  40 mEq Oral BID   pregabalin  50 mg Oral TID   senna-docusate  1 tablet Oral BID   sodium chloride flush  3 mL Intravenous Q12H   Continuous Infusions:  sodium chloride 10 mL/hr at 01/07/21 1710     LOS: 37 days    Marcellus Scott, MD, [place], [facility]. [facility]  To contact the attending provider between 7A-7P or the covering provider during after hours 7P-7A, please log into the web site [email or web address] and access using universal [place] password for that web site. If you do not have the password, please call the hospital operator.

## 2021-01-17 LAB — BASIC METABOLIC PANEL
Anion gap: 8 (ref 5–15)
BUN: 8 mg/dL (ref 6–20)
CO2: 35 mmol/L — ABNORMAL HIGH (ref 22–32)
Calcium: 9 mg/dL (ref 8.9–10.3)
Chloride: 92 mmol/L — ABNORMAL LOW (ref 98–111)
Creatinine, Ser: 0.42 mg/dL — ABNORMAL LOW (ref 0.44–1.00)
GFR, Estimated: >60 mL/min (ref >60)
Glucose, Bld: 103 mg/dL — ABNORMAL HIGH (ref 70–99)
Potassium: 4 mmol/L (ref 3.5–5.1)
Sodium: 135 mmol/L (ref 135–145)

## 2021-01-17 MED ORDER — PREGABALIN 75 MG PO CAPS
75.0000 mg | ORAL_CAPSULE | Freq: Three times a day (TID) | ORAL | Status: DC
  Administered 2021-01-17 – 2021-01-26 (×28): 75 mg via ORAL
  Filled 2021-01-17 (×29): qty 1

## 2021-01-17 MED ORDER — ALBUMIN HUMAN 25 % IV SOLN
25.0000 g | Freq: Once | INTRAVENOUS | Status: AC
  Administered 2021-01-17: 25 g via INTRAVENOUS
  Filled 2021-01-17: qty 100

## 2021-01-17 NOTE — Progress Notes (Signed)
Physical Therapy Treatment Patient Details Name: Carla Little MRN: [id number] DOB: 1990/05/03 Today's Date: 01/17/2021   History of Present Illness Patient is a 30 y.o. female who presented to [facility] for intractable N/V on 9/17. ED labwork reveals e. coli UTI. Pt had recent hospital admission from 8/8-9/5 due to Lt knee dislocation which was reduced with fractures of the proximal fibula ligamentous avulsion laterally as well as medially off the medial femoral condyle and MRI showed complete ACL & PCL tears and MCL strain. That hospital admission was complicated by nausea, vomiting, tachycardia, UTI, and fecal impaction;. PMH significant for morbid obesity, GERD, fatty liver disease, depression. Patient now with Guillian-Barre syndrome    PT Comments    The patient  assisted with rolling to each side, reaching for rails .  Placed bed in chair position, patient worked on sitting  trunk control and UE exercises. See details below.  Patient does appear with less tight edema of extremities.  B LE's remain profoundly weak, BUE's demonstrate improved strength. Continue tilting and balance sitting  and NMR. BP 131/82, HR 105, SPO2 on RA 91%. Replaced 2 l.  Recommendations for follow up therapy are one component of a multi-disciplinary discharge planning process, led by the attending physician.  Recommendations may be updated based on patient status, additional functional criteria and insurance authorization.  Follow Up Recommendations   SNF         Equipment Recommendations    WC/cushion   Recommendations for Other Services       Precautions / Restrictions Precautions Precautions: Fall Precaution Comments: LE's remain very weak, not supporting in tilt to stand.     Mobility  Bed Mobility   Bed Mobility: Rolling Rolling: Max assist;+2 for physical assistance;+2 for safety/equipment         General bed mobility comments: patient placed in  bed chair position  with tilted reverse  Trendelenberg for gravity assist to sit upright, foot of bed lowered. patient worked on trunk control, leaning forward , and other balance activities.    Transfers                        Ambulation/Gait                 Stairs             Wheelchair Mobility    Modified Rankin (Stroke Patients Only)       Balance   Sitting-balance support: No upper extremity supported Sitting balance-Leahy Scale: Fair Sitting balance - Comments: worked on  leaning forward , required  assistance to get to upright. patient sat upright for total of 25 " with 1 rest break                                    Cognition Arousal/Alertness: Awake/alert Behavior During Therapy: Flat affect;WFL for tasks assessed/performed Overall Cognitive Status: Within Functional Limits for tasks assessed                                          Exercises General Exercises - Upper Extremity Shoulder Flexion: (P) AAROM;Both;10 reps;Seated Shoulder Extension: Strengthening;10 reps;Seated Shoulder ABduction: AAROM;10 reps;Both;Supine Shoulder ADduction: AROM;Both;10 reps;Supine Shoulder Horizontal ABduction: AROM;Theraband;Both;Supine    General Comments        Pertinent Vitals/Pain  Pain Score: 7  Pain Location: legs Pain Descriptors / Indicators: Aching;Sore Pain Intervention(s): Monitored during session;Premedicated before session    Home Living                          Prior Function            PT Goals (current goals can now be found in the care plan section) Acute Rehab PT Goals Patient Stated Goal: get better Progress towards PT goals: Progressing toward goals    Frequency    Min 2X/week      PT Plan      Co-evaluation              AM-PAC PT "6 Clicks" Mobility   Outcome Measure  Help needed turning from your back to your side while in a flat bed without using bedrails?: Total Help needed moving from lying on  your back to sitting on the side of a flat bed without using bedrails?: Total Help needed moving to and from a bed to a chair (including a wheelchair)?: Total Help needed standing up from a chair using your arms (e.g., wheelchair or bedside chair)?: Total Help needed to walk in hospital room?: Total Help needed climbing 3-5 steps with a railing? : Total 6 Click Score: 6    End of Session   Activity Tolerance: Patient tolerated treatment well Patient left: in bed;with call bell/phone within reach Nurse Communication: Mobility status;Need for lift equipment PT Visit Diagnosis: Muscle weakness (generalized) (M62.81);Other symptoms and signs involving the nervous system (R29.898) Pain - Right/Left: Left Pain - part of body: Knee     Time: 1145-1230 PT Time Calculation (min) (ACUTE ONLY): 45 min  Charges:  $Therapeutic Activity: 8-22 mins $Neuromuscular Re-education: 23-37 mins                     Blanchard Kelch PT Acute Rehabilitation Services Pager [phone] Office [phone]    Rada Hay 01/17/2021, 3:05 PM

## 2021-01-17 NOTE — Progress Notes (Signed)
PROGRESS NOTE    Carla Little  ZSW:[id number] DOB: 1990/10/25 DOA: 12/09/2020 PCP: Patient, No Pcp Per (Inactive)   Brief Narrative: Carla Little is a 29 y.o. female with a history of morbid obesity, recent left knee dislocation. Patient presented secondary to weakness with presentation concerning for Guillain Barre syndrome. Neurology consulted and patient started on IVIG. She was also found to have vitamin B12 and folate deficiencies. Patient is now awaiting rehab options for discharge.  Developed nausea, vomiting, rectal pain/abnormality on CT, [facility] consulted and s/p colonoscopy and EGD 10/18.  Multifactorial anasarca, treating with IV Lasix.   Assessment & Plan:   Principal Problem:   Intractable vomiting with nausea Active Problems:   Left knee dislocation   PUD (peptic ulcer disease)   Nonalcoholic steatohepatitis (NASH)   Class 3 obesity (HCC)   Sinus tachycardia   Hypokalemia   GERD (gastroesophageal reflux disease)   Iron deficiency anemia due to chronic blood loss   E. coli UTI   Guillain Barr syndrome (HCC)   Guillain Barre syndrome Neuropathy Neurology consulted and completed IVIG x5 days as per recommendations. Also recommended treating confounding diagnoses of vitamin B12 and folate deficiency.  -As per recommendations by Dr. Carlis Abbott, PM &R, had reduced Lyrica (which could contribute to swelling) dose but reports worsening neuropathic pain and hence increased it back to prior dose of 75 Mg 3 times daily. -Neurology (signed off 10/2): Vitamin B12 qweek for 4 weeks, then monthly Vitamin B12 injections. - Consider outpatient follow-up with Neurology. - As per nursing, PT input and my observation, patient is very motivated to get better and has been participating in therapies and making gradual progress.    Intractable nausea and vomiting - [facility] consulted, s/p EGD 10/18 without any acute abnormalities noted. - Nausea and vomiting had resolved for  a few days and then had multiple episodes again on 10/23, unclear etiology. - Since then has improved again. Tolerating her meds and some diet this morning.  Per nursing, Compazine helping her.  Rectal inflammation - Seen on CT imaging. Rectal thickening extending into rectosigmoid junction. - Patient does mention pain with having bowel movements/wiping but otherwise did not have symptoms. She reports history of no anal related sexual activity. No systemic symptoms concerning for infection. - GI consulted. Anusol suppositories, s/p flexible sigmoidoscopy which was not an adequate prep but did not show any significant abnormalities.  May need a repeat study as outpatient.  I discussed with Dr.Brahmbhatt, [facility] on 10/19 and he suggested that there was no need for repeating the sigmoidoscopy unless she develops rectal bleeding.  He did not see any major lesions.  Depression/anxiety - Evaluated by psychiatry - Continue Buspar 5 mg BID - Given her very morbid obesity and risk of weight gain, Remeron which was started during this admission was discontinued.  Lexapro was started only for a day which patient was reluctant to take because she has taken it in the past and made her feel like a zombie.  She had been on BuSpar, Lexapro and as needed Vistaril in the past per her report.  Also Lexapro had made her eat a lot. - Thereby consulted psychiatry again and Lexapro discontinued. Wellbutrin XL 150 mg initiated 10/22, patient tolerating thus far, continuing.  Psychiatry will follow-up again in a couple of days.  Shortness of breath - Transient. Resolved.  Left knee dislocation - Patient previously evaluated by orthopedic surgery with recommendation for hinge brace and to allow for joint  scarring.  - Currently weight bearing as tolerated with limited flexion of left knee. - Has not been able to weight-bear during my watch for the last week.  Sinus tachycardia - This has been managed with metoprolol.  Unsure of etiology. Possibly related to deconditioning. No concerning symptoms.  Iron deficiency anemia - Related to heavy menstruation. Hemoglobin stable in the 9 g range..  Elevated AST/ALT NAFLD Hyperbilirubinemia AST/ALT with initial trend upwards. No associated symptoms. Abdominal ultrasound (10/11) significant for fatty liver disease and cholelithiasis without cholecystitis. INR normal. Bilirubin stable and AST/ALT now stable but elevated. GI consulted. -AST and ALT stable.  Periodic follow-up with labs and follow-up as outpatient.  B12 deficiency Folate deficiency B12 and folate of 153 and 3.2 respectively. Patient started on Vitamin B12 and folic acid supplementation  E. Coli UTI Completed treatment with Ceftriaxone and transitioned to Cefazolin.  Overnight hypoxia Likely patient has OSA and/or OHS judging by symptoms and body habitus. Currently managed with oxygen overnight while asleep. CT abdomen/pelvis significant for evidence of hypoventilation. Attempted to trial CPAP but patient declined treatment. Nocturnal oxygen saturation on room air showed hypoxia in the 80s and was placed back on oxygen.  Hypocalcemia Mild and likely related to hypoalbuminemia.  Hypokalemia -Replaced.  LE edema/anasarca - Possibly related to hypoalbuminemia. No evidence of kidney or heart impairment. Liver does have some disease; normal bilirubin. No ascites on ultrasound. - Treating with IV Lasix 40 mg twice daily.  Also received IV albumin on 10/23 and 10/25. - Monitor BMP closely.  Bilateral leg pain Low suspicion for acute DVT, however pain is apparently new onset. Difficult to assess from examination secondary to body habitus. Venous duplex was negative for acute DVT, bilaterally.  Possible oral candidiasis Treated with Diflucan IV.  Resolved.  Positive blood culture result 1/4 samples significant for staphylococcus epidermidis. Unlikely active infection.  Morbid obesity Body mass  index is 86.24 kg/m. Dietitian consulted. -Dietitian recommendations (10/12): Liberalize diet to regular to promote oral intake Will request food be chopped into bite sized pieces and dining services alert nursing staff when tray is delivered Nursing staff to assist with set-up and feeeding of meals Increase Ensure Enlive po to TID, each supplement provides 350 kcal and 20 grams of protein Continue MVI regimen   DVT prophylaxis: Lovenox Code Status:   Code Status: Full Code Family Communication: None at bedside Disposition Plan: DC to SNF pending bed and improvement in anasarca.  Discussed with TOC during morning rounds.   Consultants:  Neurology Psychiatry Gastroenterology  Procedures:  EGD  Antimicrobials: Ceftriaxone Ancef Diflucan Flagyl    Subjective: "Please increase my Lyrica to previous dose".  Reports worsening of pins and needle sensation and cramps in her legs.  Had occasional nausea and vomiting yesterday but better today.  Tolerated diet without nausea or vomiting.  Generalized swelling gradually improving.  Objective: Vitals:   01/17/21 0135 01/17/21 0340 01/17/21 0840 01/17/21 1333  BP:  117/77 123/79 129/76  Pulse:  100 (!) 109 95  Resp:  (!) 22 20 14   Temp:  99.7 F (37.6 C) 98.8 F (37.1 C) 98 F (36.7 C)  TempSrc:  Oral Oral Oral  SpO2:  94% 94% 97%  Weight: (!) 228 kg     Height:        Intake/Output Summary (Last 24 hours) at 01/17/2021 1333 Last data filed at 01/17/2021 1130 Gross per 24 hour  Intake 243 ml  Output 900 ml  Net -657 ml    01/19/2021  01/16/21 0500 01/16/21 2017 01/17/21 0135  Weight: (!) 228 kg (!) 228 kg (!) 228 kg    Examination:  General exam: Pleasant young female, very morbidly obese, lying comfortably propped up in bed without distress.   Respiratory system: Clear to auscultation.  No increased work of breathing. Cardiovascular system: S1 and S2 heard, RRR.  Unable to appreciate JVD due to body habitus.   Upper and lower extremity edema slowly decreasing.  Not tight anymore. Gastrointestinal system: Abdomen is nondistended, soft and nontender.  Normal bowel sounds heard. Central nervous system: Alert and oriented. No focal neurological deficits. Musculoskeletal: Moves all extremities symmetrically Skin: No cyanosis. No rashes Psychiatry: Judgement and insight appear normal.  Appeared to be in good spirits when I saw her this morning.  PT getting ready to work with her at bedside.  Data Reviewed: I have personally reviewed following labs and imaging studies  CBC Lab Results  Component Value Date   WBC 10.1 01/16/2021   RBC 3.27 (L) 01/16/2021   HGB 9.5 (L) 01/16/2021   HCT 32.1 (L) 01/16/2021   MCV 98.2 01/16/2021   MCH 29.1 01/16/2021   PLT 523 (H) 01/16/2021   MCHC 29.6 (L) 01/16/2021   RDW 19.9 (H) 01/16/2021   LYMPHSABS 2.0 01/03/2021   MONOABS 0.9 01/03/2021   EOSABS 0.0 01/03/2021   BASOSABS 0.0 01/03/2021     Last metabolic panel Lab Results  Component Value Date   NA 135 01/17/2021   K 4.0 01/17/2021   CL 92 (L) 01/17/2021   CO2 35 (H) 01/17/2021   BUN 8 01/17/2021   CREATININE 0.42 (L) 01/17/2021   GLUCOSE 103 (H) 01/17/2021   GFRNONAA >60 01/17/2021   GFRAA >60 05/16/2018   CALCIUM 9.0 01/17/2021   PHOS 2.9 12/14/2020   PROT 9.1 (H) 01/16/2021   ALBUMIN 3.5 01/16/2021   BILITOT 1.6 (H) 01/16/2021   ALKPHOS 87 01/16/2021   AST 350 (H) 01/16/2021   ALT 123 (H) 01/16/2021   ANIONGAP 8 01/17/2021    CBG (last 3)  No results for input(s): GLUCAP in the last 72 hours.   GFR: Estimated Creatinine Clearance: 197.6 mL/min (A) (by C-G formula based on SCr of 0.42 mg/dL (L)).  Coagulation Profile: No results for input(s): INR, PROTIME in the last 168 hours.   No results found for this or any previous visit (from the past 240 hour(s)).       Radiology Studies: No results found.      Scheduled Meds:  buPROPion  150 mg Oral Daily   busPIRone  5  mg Oral BID   cyanocobalamin  1,000 mcg Intramuscular Weekly   enoxaparin (LOVENOX) injection  50 mg Subcutaneous Q12H   feeding supplement  237 mL Oral TID BM   folic acid  1 mg Oral Daily   furosemide  40 mg Intravenous BID   hydrocortisone  25 mg Rectal BID   lidocaine  1 patch Transdermal Daily   magic mouthwash w/lidocaine  5 mL Oral TID AC   metoprolol tartrate  25 mg Oral BID   multivitamin with minerals  1 tablet Oral Daily   pantoprazole  40 mg Oral BID   polyethylene glycol  17 g Oral Daily   potassium chloride  40 mEq Oral BID   pregabalin  50 mg Oral TID   senna-docusate  1 tablet Oral BID   sodium chloride flush  3 mL Intravenous Q12H   Continuous Infusions:  sodium chloride 10 mL/hr at 01/07/21 1710  LOS: 38 days    Marcellus Scott, MD, [place], [facility]. [facility]  To contact the attending provider between 7A-7P or the covering provider during after hours 7P-7A, please log into the web site [email or web address] and access using universal [place] password for that web site. If you do not have the password, please call the hospital operator.

## 2021-01-17 NOTE — Progress Notes (Signed)
Chaplain came to check in on patient but she was sleeping.  Chaplain will follow-up.    01/17/21 1600  Clinical Encounter Type  Visited With Patient not available  Visit Type Follow-up

## 2021-01-18 LAB — BASIC METABOLIC PANEL
Anion gap: 10 (ref 5–15)
BUN: 10 mg/dL (ref 6–20)
CO2: 34 mmol/L — ABNORMAL HIGH (ref 22–32)
Calcium: 9.1 mg/dL (ref 8.9–10.3)
Chloride: 89 mmol/L — ABNORMAL LOW (ref 98–111)
Creatinine, Ser: 0.5 mg/dL (ref 0.44–1.00)
GFR, Estimated: >60 mL/min (ref >60)
Glucose, Bld: 104 mg/dL — ABNORMAL HIGH (ref 70–99)
Potassium: 3.4 mmol/L — ABNORMAL LOW (ref 3.5–5.1)
Sodium: 133 mmol/L — ABNORMAL LOW (ref 135–145)

## 2021-01-18 MED ORDER — ENSURE MAX PROTEIN PO LIQD
11.0000 [oz_av] | Freq: Every day | ORAL | Status: DC
  Administered 2021-01-18 – 2021-01-23 (×5): 11 [oz_av] via ORAL
  Filled 2021-01-18 (×9): qty 330

## 2021-01-18 MED ORDER — ENSURE ENLIVE PO LIQD
237.0000 mL | Freq: Two times a day (BID) | ORAL | Status: DC
  Administered 2021-01-18 – 2021-01-26 (×17): 237 mL via ORAL

## 2021-01-18 MED ORDER — PROSOURCE PLUS PO LIQD
30.0000 mL | Freq: Two times a day (BID) | ORAL | Status: DC
  Administered 2021-01-18 – 2021-01-25 (×14): 30 mL via ORAL
  Filled 2021-01-18 (×12): qty 30

## 2021-01-18 NOTE — Progress Notes (Signed)
PROGRESS NOTE    Carla Little  VOZ:[id number] DOB: [birth year] DOA: 12/09/2020 PCP: Patient, No Pcp Per (Inactive)   Brief Narrative: Carla Little is a 30 y.o. female with a history of morbid obesity, recent left knee dislocation. Patient presented secondary to weakness with presentation concerning for Guillain Barre syndrome. Neurology consulted and patient started on IVIG. She was also found to have vitamin B12 and folate deficiencies. Patient is now awaiting rehab options for discharge.   Assessment & Plan:   Principal Problem:   Intractable vomiting with nausea Active Problems:   Left knee dislocation   PUD (peptic ulcer disease)   Nonalcoholic steatohepatitis (NASH)   Class 3 obesity (HCC)   Sinus tachycardia   Hypokalemia   GERD (gastroesophageal reflux disease)   Iron deficiency anemia due to chronic blood loss   E. coli UTI   Guillain Barr syndrome (HCC)   Guillain Barre syndrome Neuropathy Neurology consulted and recommended IVIG, for which patient completed 5 day course. Also recommended treating confounding diagnoses of vitamin B12 and folate deficiency.  -Continue to Lyrica 75 mg TID -Neurology (signed off 10/2): Vitamin B12 qweek for 4 weeks, then monthly Vitamin B12 injections.  Intractable nausea and vomiting Initially resolved, recurrent. Associated generalized abdominal pain. In setting of known gastritis. GI consulted and patient underwent EGD on 10/18 without etiology. Improved. Recommendation for outpatient general surgery follow-up for cholelithiasis.  Rectal inflammation Seen on CT imaging. Rectal thickening extending into rectosigmoid junction. Patient does mention pain with having bowel movements/wiping but otherwise did not have symptoms. She reports history of no anal related sexual activity. No systemic symptoms concerning for infection. GI consulted and flexible sigmoidoscopy performed on 10/18 which was significant for poor prep and no  obvious lesions except for hemorrhoids. GI signed off 10/19.  Depression/anxiety Evaluated by psychiatry -Continue Buspar 5 mg BID -Switched from Remeron to Wellbutrin XL  Shortness of breath Transient. Resolved.  Left knee dislocation Patient previously evaluated by orthopedic surgery with recommendation for hinge brace and to allow for joint scarring. Currently weight bearing as tolerated with limited flexion of left knee.  Sinus tachycardia This has been managed with metoprolol. Unsure of etiology. Possibly related to deconditioning. No concerning symptoms.  Iron deficiency anemia Related to heavy menstruation. Hemoglobin stable.  Elevated AST/ALT NAFLD Hyperbilirubinemia AST/ALT with initial trend upwards. No associated symptoms. Abdominal ultrasound (10/11) significant for fatty liver disease and cholelithiasis without cholecystitis. INR normal. Bilirubin stable and AST/ALT now stable but elevated. GI consulted.  B12 deficiency Folate deficiency B12 and folate of 153 and 3.2 respectively. Patient started on Vitamin B12 and folic acid supplementation  E. Coli UTI Completed treatment with Ceftriaxone and transitioned to Cefazolin.  Overnight hypoxia Likely patient has OSA and/or OHS judging by symptoms and body habitus. Currently managed with oxygen overnight while asleep. CT abdomen/pelvis significant for evidence of hypoventilation. Attempted to trial CPAP but patient declined treatment.  Hypocalcemia Mild and likely related to hypoalbuminemia.  Hypokalemia -Potassium supplementation as needed  Anasarca Likely related to hypoalbuminemia. No evidence of kidney or heart impairment. Liver does have some disease; slightly elevated bilirubin. No ascites on ultrasound. Patient treated with Lasix with some improvement; unfortunately, there has been some contraction alkalosis -Use Lasix sparingly  Bilateral leg pain Low suspicion for acute DVT, however pain is apparently new  onset. Difficult to assess from examination secondary to body habitus. Venous duplex was negative for acute DVT, bilaterally.  Possible oral candidiasis Treated with Diflucan IV. Seems to be  resolved.  Positive blood culture result 1/4 samples significant for staphylococcus epidermidis. Unlikely active infection.  Morbid obesity Body mass index is 86.24 kg/m. Dietitian consulted. -Dietitian recommendations (10/12): Liberalize diet to regular to promote oral intake Will request food be chopped into bite sized pieces and dining services alert nursing staff when tray is delivered Nursing staff to assist with set-up and feeeding of meals Increase Ensure Enlive po to TID, each supplement provides 350 kcal and 20 grams of protein Continue MVI regimen   DVT prophylaxis: Lovenox Code Status:   Code Status: Full Code Family Communication: None at bedside Disposition Plan: Discharge to SNF when bed is available if remains stable.   Consultants:  Neurology Psychiatry Gastroenterology  Procedures:  None  Antimicrobials: Ceftriaxone Ancef Diflucan Flagyl    Subjective: No issues this morning.  Objective: Vitals:   01/17/21 1333 01/17/21 2037 01/18/21 0110 01/18/21 0359  BP: 129/76 124/70  (!) 131/93  Pulse: 95 100  95  Resp: 14 20  20   Temp: 98 F (36.7 C) 98.9 F (37.2 C)  98.8 F (37.1 C)  TempSrc: Oral Oral  Oral  SpO2: 97% 99%  95%  Weight:   (!) 228 kg   Height:        Intake/Output Summary (Last 24 hours) at 01/18/2021 1249 Last data filed at 01/18/2021 0445 Gross per 24 hour  Intake 300 ml  Output 1600 ml  Net -1300 ml   Filed Weights   01/16/21 2017 01/17/21 0135 01/18/21 0110  Weight: (!) 228 kg (!) 228 kg (!) 228 kg    Examination:  General exam: Appears calm and comfortable Respiratory system: Clear to auscultation. Respiratory effort normal. Cardiovascular system: S1 & S2 heard, RRR. No murmurs, rubs, gallops or clicks. Gastrointestinal  system: Abdomen is nondistended, soft and nontender. No organomegaly or masses felt. Normal bowel sounds heard. Central nervous system: Alert and oriented. No focal neurological deficits. Musculoskeletal: Edema. No calf tenderness Skin: No cyanosis. No rashes Psychiatry: Judgement and insight appear normal. Mood & affect appropriate.   Data Reviewed: I have personally reviewed following labs and imaging studies  CBC Lab Results  Component Value Date   WBC 10.1 01/16/2021   RBC 3.27 (L) 01/16/2021   HGB 9.5 (L) 01/16/2021   HCT 32.1 (L) 01/16/2021   MCV 98.2 01/16/2021   MCH 29.1 01/16/2021   PLT 523 (H) 01/16/2021   MCHC 29.6 (L) 01/16/2021   RDW 19.9 (H) 01/16/2021   LYMPHSABS 2.0 01/03/2021   MONOABS 0.9 01/03/2021   EOSABS 0.0 01/03/2021   BASOSABS 0.0 01/03/2021     Last metabolic panel Lab Results  Component Value Date   NA 133 (L) 01/18/2021   K 3.4 (L) 01/18/2021   CL 89 (L) 01/18/2021   CO2 34 (H) 01/18/2021   BUN 10 01/18/2021   CREATININE 0.50 01/18/2021   GLUCOSE 104 (H) 01/18/2021   GFRNONAA >60 01/18/2021   GFRAA >60 05/16/2018   CALCIUM 9.1 01/18/2021   PHOS 2.9 12/14/2020   PROT 9.1 (H) 01/16/2021   ALBUMIN 3.5 01/16/2021   BILITOT 1.6 (H) 01/16/2021   ALKPHOS 87 01/16/2021   AST 350 (H) 01/16/2021   ALT 123 (H) 01/16/2021   ANIONGAP 10 01/18/2021    CBG (last 3)  No results for input(s): GLUCAP in the last 72 hours.   GFR: Estimated Creatinine Clearance: 197.6 mL/min (by C-G formula based on SCr of 0.5 mg/dL).  Coagulation Profile: No results for input(s): INR, PROTIME in the last 168  hours.  No results found for this or any previous visit (from the past 240 hour(s)).       Radiology Studies: No results found.      Scheduled Meds:  buPROPion  150 mg Oral Daily   busPIRone  5 mg Oral BID   cyanocobalamin  1,000 mcg Intramuscular Weekly   enoxaparin (LOVENOX) injection  50 mg Subcutaneous Q12H   feeding supplement  237 mL Oral  TID BM   folic acid  1 mg Oral Daily   furosemide  40 mg Intravenous BID   hydrocortisone  25 mg Rectal BID   lidocaine  1 patch Transdermal Daily   magic mouthwash w/lidocaine  5 mL Oral TID AC   metoprolol tartrate  25 mg Oral BID   multivitamin with minerals  1 tablet Oral Daily   pantoprazole  40 mg Oral BID   polyethylene glycol  17 g Oral Daily   potassium chloride  40 mEq Oral BID   pregabalin  75 mg Oral TID   senna-docusate  1 tablet Oral BID   sodium chloride flush  3 mL Intravenous Q12H   Continuous Infusions:  sodium chloride 10 mL/hr at 01/07/21 1710     LOS: 39 days     Jacquelin Hawking, MD [facility] 01/18/2021, 12:49 PM  If 7PM-7AM, please contact night-coverage [email or web address]

## 2021-01-18 NOTE — Progress Notes (Signed)
Occupational Therapy Treatment Patient Details Name: Carla Little MRN: [id number] DOB: [birth year] Today's Date: 01/18/2021   History of present illness Patient is a 30 y.o. female who presented to [facility] for intractable N/V on 9/17. ED labwork reveals e. coli UTI. Pt had recent hospital admission from 8/8-9/5 due to Lt knee dislocation which was reduced with fractures of the proximal fibula ligamentous avulsion laterally as well as medially off the medial femoral condyle and MRI showed complete ACL & PCL tears and MCL strain. That hospital admission was complicated by nausea, vomiting, tachycardia, UTI, and fecal impaction;. PMH significant for morbid obesity, GERD, fatty liver disease, depression. Patient now with Guillian-Barre syndrome   OT comments  Patient was able to participate more in brushing teeth with electric tooth brush. Patient was set up with mirror to use toothbrush and floss picks to clear residual food from teeth with min A.patient was educated on importance of cleaning mouth to reduce risks of bacterial build up and aspiration. Patient verbalized understanding.  Patient was able to don new hospital gown with min A to clear over bilateral shoulders. Patient was noted to have reduced edema and increased ROM of BUE on this date. Patient was able to reach BUE overhead sitting in bed and sitting forwards in chair positioning with back off bed. Patient's discharge plan remains appropriate at this time. OT will continue to follow acutely.     Recommendations for follow up therapy are one component of a multi-disciplinary discharge planning process, led by the attending physician.  Recommendations may be updated based on patient status, additional functional criteria and insurance authorization.    Follow Up Recommendations  Skilled nursing-short term rehab (<3 hours/day)    Assistance Recommended at Discharge    Equipment Recommendations  None recommended by OT    Recommendations  for Other Services      Precautions / Restrictions Precautions Precautions: Fall Required Braces or Orthoses: Knee Immobilizer - Left Other Brace: does not need KI at this time but may consider since opatient has more weakness due GBS Restrictions Weight Bearing Restrictions: No LLE Weight Bearing: Weight bearing as tolerated Other Position/Activity Restrictions: PT Orders from Maureen Ralphs, MD; "WBAT Left LE, only up with a walker, does not need brace at this standpoint"       Mobility Bed Mobility Overal bed mobility: Needs Assistance Bed Mobility: Rolling Rolling: Max assist;+2 for physical assistance;+2 for safety/equipment         General bed mobility comments: patient was able to particiapte in chair position in bed with patient able to sit fowards with back off bed.    Transfers                         Balance                                           ADL either performed or assessed with clinical judgement   ADL Overall ADL's : Needs assistance/impaired     Grooming: Oral care;Minimal assistance;Sitting Grooming Details (indicate cue type and reason): patient was sitting up in chair position in bed. patient was using electric toothbrush for brushing teeth. patient was noted to have increased food build up in mouth. patient was able to use flood pick to remove bits from teeth with mirror on bedside table. patient was educated on importance of clearing  mouth after all intake to reduce risks of aspiration. nursing sent message about younker at this time.         Upper Body Dressing : Sitting;Bed level;Minimal assistance Upper Body Dressing Details (indicate cue type and reason): pulling up hospital gown with patient havign difficulty with clearing gown over bilateral shoulders.                   General ADL Comments: patient was able to don lotion on fronts of BLE while seated in chair position in room     Vision        Perception     Praxis      Cognition Arousal/Alertness: Awake/alert Behavior During Therapy: Flat affect;WFL for tasks assessed/performed Overall Cognitive Status: Within Functional Limits for tasks assessed                                            Exercises     Shoulder Instructions       General Comments      Pertinent Vitals/ Pain       Pain Assessment: No/denies pain  Home Living                                          Prior Functioning/Environment              Frequency  Min 2X/week        Progress Toward Goals  OT Goals(current goals can now be found in the care plan section)  Progress towards OT goals: Progressing toward goals     Plan Discharge plan remains appropriate    Co-evaluation    PT/OT/SLP Co-Evaluation/Treatment: Yes Reason for Co-Treatment: Complexity of the patient's impairments (multi-system involvement);For patient/therapist safety PT goals addressed during session: Mobility/safety with mobility OT goals addressed during session: ADL's and self-care      AM-PAC OT "6 Clicks" Daily Activity     Outcome Measure   Help from another person eating meals?: A Little Help from another person taking care of personal grooming?: A Little Help from another person toileting, which includes using toliet, bedpan, or urinal?: Total Help from another person bathing (including washing, rinsing, drying)?: A Lot Help from another person to put on and taking off regular upper body clothing?: Total Help from another person to put on and taking off regular lower body clothing?: Total 6 Click Score: 11    End of Session    OT Visit Diagnosis: Other abnormalities of gait and mobility (R26.89);History of falling (Z91.81);Pain;Muscle weakness (generalized) (M62.81)   Activity Tolerance Patient tolerated treatment well   Patient Left in bed;with call bell/phone within reach   Nurse Communication Other  (comment) (nurse cleared patient for session. nurse messaged about younker)        Time: 1350-1430 OT Time Calculation (min): 40 min  Charges: OT General Charges $OT Visit: 1 Visit OT Treatments $Self Care/Home Management : 23-37 mins  Sharyn Blitz OTR/L, MS Acute Rehabilitation Department Office# [phone] Pager# [phone]   Carla Little 01/18/2021, 4:10 PM

## 2021-01-18 NOTE — Progress Notes (Signed)
   01/18/21 1300  Mobility  Range of Motion/Exercises Right arm;Left arm  Mobility Response Tolerated well  Mobility performed by Mobility specialist  $Mobility charge 1 Mobility   Pt agreeable to do UE exercises. Conducted arm raises x10 per side, cross body pulls x10 each side, lateral arm abductions x10 each side, and arm forward abduction x5 each side. Tolerated well. She stated he likes the exercises and feels more motivated to do them with someone in the room with her. Pt also attempted to brush her teeth. Aided in applying toothpaste and guiding the brush into her mouth. Pt requested to brush teeth again tonight. NT notified of session and request.    Timoteo [employer] Mobility Specialist Acute Rehab Services Office: [phone]

## 2021-01-18 NOTE — Progress Notes (Signed)
Nutrition Follow-up  DOCUMENTATION CODES:   Morbid obesity  INTERVENTION:   -Ensure Enlive po BID, each supplement provides 350 kcal and 20 grams of protein  -Ensure MAX Protein po daily, each supplement provides 150 kcal and 30 grams of protein   -Will order 30 ml Prosource Plus BID, each supplement provides 100 kcal and 15 grams protein.    -Multivitamin with minerals daily  NUTRITION DIAGNOSIS:   Inadequate oral intake related to poor appetite (difficulty with self-feeding) as evidenced by meal completion < 25%, per patient/family report.  Ongoing.  GOAL:   Patient will meet greater than or equal to 90% of their needs  Not meeting.  MONITOR:   PO intake, Supplement acceptance, Weight trends  ASSESSMENT:   30 y.o. female with history of class III obesity, depression, GERD, nonalcoholic steatohepatitis, and PUD presented to ED 9/16 after being dc 9/5 after a month long admission with complaints of worsening nausea and vomiting at home.  10/18: s/p EGD, flex sigmoidoscopy  Spoke with SLP regarding this patient's supplement reliance and ability to tolerate more of her meals. SLP plans to see pt this week for possible diet upgrade.  Currently on dysphagia 3 diet, pt reports being denied certain foods she feels like eating on this diet. Again SLP to see pt prior to any change. Pt still would like meats chopped.  Patient continues to report poor appetite. States she was brought a cake for her birthday and she gave half of it away to staff and the rest she didn't touch.  Relies on Ensure supplements if not eating meals. Pt at most consuming 1-2 Ensure daily over the past few days. (At most providing ~700 kcals and 40g protein).  Will add Ensure Max supplement as third option for additional protein.  If starts eating her meals, can transition off  Ensure Enlive and just have Ensure Max. States she plans to eat a piece of toast today. Pt is not anywhere near meeting kcal or  protein needs so this is not contributing to increases in weight.  Admission weight: 399 lbs. Weight recorded as 502 lbs for that last 5 days.  Suspect last true weight was on 10/21 when she weighed 422 lbs.  I/Os: -6.5L since 10/12  Medications: Vitamin B-12 weekly, Folic acid, Lasix, Magic Mouthwash w/ lidocaine, Multivitamin with minerals daily, Miralax, KLOR-CON, Senokot  Labs reviewed: Low Na, K Glu 104  Diet Order:   Diet Order             DIET DYS 3 Room service appropriate? Yes; Fluid consistency: Thin  Diet effective now                   EDUCATION NEEDS:   Education needs have been addressed  Skin:  Skin Assessment: Reviewed RN Assessment  Last BM:  10/26 -type 6  Height:   Ht Readings from Last 1 Encounters:  01/13/21 5' 4.02" (1.626 m)    Weight:   Wt Readings from Last 1 Encounters:  01/18/21 (!) 228 kg    BMI:  Body mass index is 86.24 kg/m.  Estimated Nutritional Needs:   Kcal:  2000-2200 kcal/d  Protein:  100-110 g/d  Fluid:  1.8-2 L/d  Carla Franco, MS, RD, LDN Inpatient Clinical Dietitian Contact information available via Amion

## 2021-01-19 LAB — BASIC METABOLIC PANEL
Anion gap: 9 (ref 5–15)
BUN: 12 mg/dL (ref 6–20)
CO2: 35 mmol/L — ABNORMAL HIGH (ref 22–32)
Calcium: 9.3 mg/dL (ref 8.9–10.3)
Chloride: 92 mmol/L — ABNORMAL LOW (ref 98–111)
Creatinine, Ser: 0.36 mg/dL — ABNORMAL LOW (ref 0.44–1.00)
GFR, Estimated: >60 mL/min (ref >60)
Glucose, Bld: 110 mg/dL — ABNORMAL HIGH (ref 70–99)
Potassium: 3.7 mmol/L (ref 3.5–5.1)
Sodium: 136 mmol/L (ref 135–145)

## 2021-01-19 NOTE — Progress Notes (Addendum)
Physical Therapy Treatment-Late entry. Patient Details Name: Carla Little MRN: [id number] DOB: 07-15-1990 Today's Date: 01/19/2021   History of Present Illness Patient is a 30 y.o. female who presented to [facility] for intractable N/V on 9/17. ED labwork reveals e. coli UTI. Pt had recent hospital admission from 8/8-9/5 due to Lt knee dislocation which was reduced with fractures of the proximal fibula ligamentous avulsion laterally as well as medially off the medial femoral condyle and MRI showed complete ACL & PCL tears and MCL strain. That hospital admission was complicated by nausea, vomiting, tachycardia, UTI, and fecal impaction;. PMH significant for morbid obesity, GERD, fatty liver disease, depression. Patient now with Guillian-Barre syndrome    PT Comments    The Patient is noted to be jovial with staff  performing hygiene.  Noted much  decreased in edema of the extremities and abdomen. Able to place her Crocks on her feet!  Patient placed in  upright bed /chair position. Patient able to lean forward, reaching for rails with both UE's to self assist pulling to upright.  Patient sat upright x 30" without a rest break. Performed strengthening exercises with theraband and maintained upright balance.   Continue NMR exercises, sitting balance . Both LE's remain severly  weak but with slight improved strength in dorsiflexion. Sensation remains impaired in LE's and UE's but noted with much improved fine motor control of hands. Able to hold and use a tooth pick.  Recommendations for follow up therapy are one component of a multi-disciplinary discharge planning process, led by the attending physician.  Recommendations may be updated based on patient status, additional functional criteria and insurance authorization.  Follow Up Recommendations  Skilled nursing-short term rehab (<3 hours/day)Needs intensive rehab !!!     Assistance Recommended at Discharge Frequent or constant  Supervision/Assistance  Equipment Recommendations  Wheelchair (measurements PT);Wheelchair cushion (measurements PT)    Recommendations for Other Services       Precautions / Restrictions Precautions Precautions: Fall Precaution Comments: LE's remain very weak, not supporting in tilt to stand. Other Brace: does not need KI at this time but may consider since opatient has more weakness due GBS     Mobility  Bed Mobility               General bed mobility comments: patient was able to particiapte in chair position in bed with patient able to sit fowards with back off bed, Patient sat upright x 30"    Transfers                        Ambulation/Gait                 Stairs             Wheelchair Mobility    Modified Rankin (Stroke Patients Only)       Balance Overall balance assessment: Needs assistance Sitting-balance support: No upper extremity supported                                        Cognition                                                Exercises General Exercises - Upper Extremity Shoulder Flexion:  Strengthening;Both;10 reps;Theraband Theraband Level (Shoulder Flexion): Level 2 (Red) Shoulder Extension: Strengthening;10 reps;Seated Theraband Level (Shoulder Extension): Level 2 (Red) Shoulder ABduction: AAROM;10 reps;Both;Seated;Theraband Theraband Level (Shoulder Abduction): Level 2 (Red) Shoulder ADduction: Both;10 reps;Strengthening;Theraband Theraband Level (Shoulder Adduction): Level 2 (Red) Shoulder Horizontal ABduction: 10 reps;Strengthening;Both;Theraband Theraband Level (Shoulder Horizontal Abduction): Level 2 (Red) Shoulder Horizontal ADduction: Strengthening;10 reps;Seated;Theraband Theraband Level (Shoulder Horizontal Adduction): Level 2 (Red) Elbow Flexion: Both;Strengthening;10 reps;Theraband Theraband Level (Elbow Flexion): Level 2 (Red) Elbow Extension: Both;10  reps;Strengthening;Seated;Theraband Theraband Level (Elbow Extension): Level 2 (Red)    General Comments        Pertinent Vitals/Pain Faces Pain Scale: Hurts little more Pain Location: legs Pain Descriptors / Indicators: Aching;Sore Pain Intervention(s): Monitored during session;Premedicated before session    Home Living                          Prior Function            PT Goals (current goals can now be found in the care plan section) Acute Rehab PT Goals Time For Goal Achievement: 02/02/21 Potential to Achieve Goals: Fair Additional Goals Additional Goal #1: tolerated tilting to stand x 10 minutes at 45 degrees. Progress towards PT goals: Progressing toward goals    Frequency    Min 2X/week      PT Plan Current plan remains appropriate    Co-evaluation PT/OT/SLP Co-Evaluation/Treatment: Yes Reason for Co-Treatment: Complexity of the patient's impairments (multi-system involvement);For patient/therapist safety PT goals addressed during session: Mobility/safety with mobility OT goals addressed during session: ADL's and self-care      AM-PAC PT "6 Clicks" Mobility   Outcome Measure  Help needed turning from your back to your side while in a flat bed without using bedrails?: A Lot Help needed moving from lying on your back to sitting on the side of a flat bed without using bedrails?: A Lot Help needed moving to and from a bed to a chair (including a wheelchair)?: Total Help needed standing up from a chair using your arms (e.g., wheelchair or bedside chair)?: Total Help needed to walk in hospital room?: Total Help needed climbing 3-5 steps with a railing? : Total 6 Click Score: 8    End of Session   Activity Tolerance: Patient tolerated treatment well Patient left: in bed;with call bell/phone within reach Nurse Communication: Mobility status;Need for lift equipment PT Visit Diagnosis: Muscle weakness (generalized) (M62.81);Other symptoms and signs  involving the nervous system (R29.898) Pain - Right/Left: Left Pain - part of body: Knee     Time:  - 1350 143    Charges:     Thera act 8-22  NMR 8-22                   Blanchard Kelch PT Acute Rehabilitation Services Pager [phone] Office [phone]   Rada Hay 01/19/2021, 6:49 AM

## 2021-01-19 NOTE — Progress Notes (Signed)
PROGRESS NOTE    Carla Little  XBD:[id number] DOB: Feb 07, 1991 DOA: 12/09/2020 PCP: Patient, No Pcp Per (Inactive)   Brief Narrative: Carla Little is a 30 y.o. female with a history of morbid obesity, recent left knee dislocation. Patient presented secondary to weakness with presentation concerning for Guillain Barre syndrome. Neurology consulted and patient started on IVIG. She was also found to have vitamin B12 and folate deficiencies. Patient is now awaiting rehab options for discharge.   Assessment & Plan:   Principal Problem:   Intractable vomiting with nausea Active Problems:   Left knee dislocation   PUD (peptic ulcer disease)   Nonalcoholic steatohepatitis (NASH)   Class 3 obesity (HCC)   Sinus tachycardia   Hypokalemia   GERD (gastroesophageal reflux disease)   Iron deficiency anemia due to chronic blood loss   E. coli UTI   Guillain Barr syndrome (HCC)   Guillain Barre syndrome Neuropathy Neurology consulted and recommended IVIG, for which patient completed 5 day course. Also recommended treating confounding diagnoses of vitamin B12 and folate deficiency.  -Continue to Lyrica 75 mg TID -Neurology (signed off 10/2): Vitamin B12 qweek for 4 weeks, then monthly Vitamin B12 injections.  Intractable nausea and vomiting Initially resolved, recurrent. Associated generalized abdominal pain. In setting of known gastritis. GI consulted and patient underwent EGD on 10/18 without etiology. Improved. Recommendation for outpatient general surgery follow-up for cholelithiasis.  Rectal inflammation Seen on CT imaging. Rectal thickening extending into rectosigmoid junction. Patient does mention pain with having bowel movements/wiping but otherwise did not have symptoms. She reports history of no anal related sexual activity. No systemic symptoms concerning for infection. GI consulted and flexible sigmoidoscopy performed on 10/18 which was significant for poor prep and no  obvious lesions except for hemorrhoids. GI signed off 10/19.  Depression/anxiety Evaluated by psychiatry -Continue Buspar 5 mg BID -Switched from Remeron to Wellbutrin XL  Shortness of breath Transient. Resolved.  Left knee dislocation Patient previously evaluated by orthopedic surgery with recommendation for hinge brace and to allow for joint scarring. Currently weight bearing as tolerated with limited flexion of left knee.  Sinus tachycardia This has been managed with metoprolol. Unsure of etiology. Possibly related to deconditioning. No concerning symptoms.  Iron deficiency anemia Related to heavy menstruation. Hemoglobin stable.  Elevated AST/ALT NAFLD Hyperbilirubinemia AST/ALT with initial trend upwards. No associated symptoms. Abdominal ultrasound (10/11) significant for fatty liver disease and cholelithiasis without cholecystitis. INR normal. Bilirubin stable and AST/ALT now stable but elevated. GI consulted.  B12 deficiency Folate deficiency B12 and folate of 153 and 3.2 respectively. Patient started on Vitamin B12 and folic acid supplementation  E. Coli UTI Completed treatment with Ceftriaxone and transitioned to Cefazolin.  Overnight hypoxia Likely patient has OSA and/or OHS judging by symptoms and body habitus. Currently managed with oxygen overnight while asleep. CT abdomen/pelvis significant for evidence of hypoventilation. Attempted to trial CPAP but patient declined treatment.  Hypocalcemia Mild and likely related to hypoalbuminemia.  Hypokalemia -Potassium supplementation as needed  Anasarca Likely related to hypoalbuminemia. No evidence of kidney or heart impairment. Liver does have some disease; slightly elevated bilirubin. No ascites on ultrasound. Patient treated with Lasix with some improvement; unfortunately, there has been some contraction alkalosis -Continue Lasix for now; BMP daily while on Lasix. If recurring alkalosis, will  discontinue.  Bilateral leg pain Low suspicion for acute DVT, however pain is apparently new onset. Difficult to assess from examination secondary to body habitus. Venous duplex was negative for acute DVT, bilaterally.  Possible oral candidiasis Treated with Diflucan IV. Seems to be resolved.  Positive blood culture result 1/4 samples significant for staphylococcus epidermidis. Unlikely active infection.  Morbid obesity Body mass index is 68.65 kg/m. Dietitian consulted. -Dietitian recommendations (10/12): Liberalize diet to regular to promote oral intake Will request food be chopped into bite sized pieces and dining services alert nursing staff when tray is delivered Nursing staff to assist with set-up and feeeding of meals Increase Ensure Enlive po to TID, each supplement provides 350 kcal and 20 grams of protein Continue MVI regimen   DVT prophylaxis: Lovenox Code Status:   Code Status: Full Code Family Communication: None at bedside Disposition Plan: Discharge to SNF when bed is available if remains stable.   Consultants:  Neurology Psychiatry Gastroenterology  Procedures:  None  Antimicrobials: Ceftriaxone Ancef Diflucan Flagyl    Subjective: No concerns this morning. Got up in the chair position for her bed yesterday. Plan for up to bedside chair today.  Objective: Vitals:   01/18/21 0359 01/18/21 1100 01/19/21 0120 01/19/21 0521  BP: (!) 131/93 130/80 127/85 125/86  Pulse: 95 93 (!) 101 (!) 107  Resp: 20 20 20 20   Temp: 98.8 F (37.1 C) 98.8 F (37.1 C) 98.4 F (36.9 C) 99.4 F (37.4 C)  TempSrc: Oral Oral Oral   SpO2: 95% 100% 96% 97%  Weight:    (!) 181.5 kg  Height:        Intake/Output Summary (Last 24 hours) at 01/19/2021 1203 Last data filed at 01/18/2021 1800 Gross per 24 hour  Intake --  Output 350 ml  Net -350 ml    Filed Weights   01/17/21 0135 01/18/21 0110 01/19/21 0521  Weight: (!) 228 kg (!) 228 kg (!) 181.5 kg     Examination:  General exam: Appears calm and comfortable  Respiratory system: Clear to auscultation. Respiratory effort normal. Cardiovascular system: S1 & S2 heard, RRR. No murmurs, rubs, gallops or clicks. Gastrointestinal system: Abdomen is nondistended, soft and nontender. No organomegaly or masses felt. Normal bowel sounds heard. Central nervous system: Alert and oriented. No focal neurological deficits. Musculoskeletal:  No calf tenderness Skin: No cyanosis. No rashes Psychiatry: Judgement and insight appear normal. Mood & affect appropriate.   Data Reviewed: I have personally reviewed following labs and imaging studies  CBC Lab Results  Component Value Date   WBC 10.1 01/16/2021   RBC 3.27 (L) 01/16/2021   HGB 9.5 (L) 01/16/2021   HCT 32.1 (L) 01/16/2021   MCV 98.2 01/16/2021   MCH 29.1 01/16/2021   PLT 523 (H) 01/16/2021   MCHC 29.6 (L) 01/16/2021   RDW 19.9 (H) 01/16/2021   LYMPHSABS 2.0 01/03/2021   MONOABS 0.9 01/03/2021   EOSABS 0.0 01/03/2021   BASOSABS 0.0 01/03/2021     Last metabolic panel Lab Results  Component Value Date   NA 133 (L) 01/18/2021   K 3.4 (L) 01/18/2021   CL 89 (L) 01/18/2021   CO2 34 (H) 01/18/2021   BUN 10 01/18/2021   CREATININE 0.50 01/18/2021   GLUCOSE 104 (H) 01/18/2021   GFRNONAA >60 01/18/2021   GFRAA >60 05/16/2018   CALCIUM 9.1 01/18/2021   PHOS 2.9 12/14/2020   PROT 9.1 (H) 01/16/2021   ALBUMIN 3.5 01/16/2021   BILITOT 1.6 (H) 01/16/2021   ALKPHOS 87 01/16/2021   AST 350 (H) 01/16/2021   ALT 123 (H) 01/16/2021   ANIONGAP 10 01/18/2021    CBG (last 3)  No results for input(s): GLUCAP in the  last 72 hours.   GFR: Estimated Creatinine Clearance: 168 mL/min (by C-G formula based on SCr of 0.5 mg/dL).  Coagulation Profile: No results for input(s): INR, PROTIME in the last 168 hours.  No results found for this or any previous visit (from the past 240 hour(s)).       Radiology Studies: No results  found.      Scheduled Meds:  (feeding supplement) PROSource Plus  30 mL Oral BID BM   buPROPion  150 mg Oral Daily   busPIRone  5 mg Oral BID   cyanocobalamin  1,000 mcg Intramuscular Weekly   enoxaparin (LOVENOX) injection  50 mg Subcutaneous Q12H   feeding supplement  237 mL Oral BID BM   folic acid  1 mg Oral Daily   furosemide  40 mg Intravenous BID   hydrocortisone  25 mg Rectal BID   lidocaine  1 patch Transdermal Daily   magic mouthwash w/lidocaine  5 mL Oral TID AC   metoprolol tartrate  25 mg Oral BID   multivitamin with minerals  1 tablet Oral Daily   pantoprazole  40 mg Oral BID   polyethylene glycol  17 g Oral Daily   potassium chloride  40 mEq Oral BID   pregabalin  75 mg Oral TID   Ensure Max Protein  11 oz Oral Daily   senna-docusate  1 tablet Oral BID   sodium chloride flush  3 mL Intravenous Q12H   Continuous Infusions:  sodium chloride 10 mL/hr at 01/07/21 1710     LOS: 40 days     Jacquelin Hawking, MD [facility] 01/19/2021, 12:03 PM  If 7PM-7AM, please contact night-coverage [email or web address]

## 2021-01-19 NOTE — Progress Notes (Signed)
Speech Language Pathology Treatment: Dysphagia  Patient Details Name: Carla Little MRN: [id number] DOB: 12-Jun-1990 Today's Date: 01/19/2021 Time: 1530-1610 SLP Time Calculation (min) (ACUTE ONLY): 40 min  Assessment / Plan / Recommendation Clinical Impression  Pt today seen to assess po tolerance, readiness for dietary advancement and education re: precautions. Pt reports multiple reasons for poor intake including displeasue with hospital food, lack of appetite, and difficulty with cleaning mouth out after intake. She reports she consumes Ensures because she "actually likes the tase of them" and "they are easier to consume".  Advised pt again to attempt to eat solids before consuming Ensure.  To which pt states she will try.  Inquired as to diet limitation impacting pt's po - to which she advised she has been denied foods like toast and bacon.  Pt willing to consume her [name] chicken nuggets - consuming 3 of them.  She tends to take very large boluses *presumed due to work of getting food to mouth, with prolonged oral manipulation and mastication.  Retention of chicken noted more in right lateral sulci than left with pt awareness.   It took the pt approximately 40 minutes to eat 3 chicken nuggets - followed by prolonged amount of time for oral clearance of retention.  Suspect pt's work of eating due to [employer] Daft is contributing to her lack of solid food intake -and question component of motivation impacting her. Pt is continuing to make small amounts of progress re: her swallowing but benefits from frequent encouragement.    HPI HPI: Patient is a 30 year old female with morbid obesity, nonalcoholic fatty liver disease, GERD, depression, recently was hospitalized following a mechanical fall with a left knee dislocation, resultant MCL strain, ACL and PCL rupture then complicated by nausea vomiting, tachycardia, UTI and fecal impaction during the hospitalization. Progressive weakness during  this hospitalization. 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      SLP Plan  Continue with current plan of care      Recommendations for follow up therapy are one component of a multi-disciplinary discharge planning process, led by the attending physician.  Recommendations may be updated based on patient status, additional functional criteria and insurance authorization.    Recommendations  Liquids provided via: Straw;Cup Medication Administration: Other (Comment) (as tolerated) Compensations: Follow solids with liquid;Lingual sweep for clearance of pocketing;Other (Comment) (use puree/icecream, etc to help transit oral retention into phayrnx or finger sweep/oral suction to clear oral residuals) Postural Changes and/or Swallow Maneuvers: Seated upright 90 degrees;Upright 30-60 min after meal                Oral Care Recommendations: Oral care BID Follow up Recommendations: Inpatient Rehab SLP Visit Diagnosis: Dysphagia, oral phase (R13.11) Plan: Continue with current plan of care       GO              Carla Infante, MS [facility] SLP Acute Rehab Services Office [phone] Pager [phone]   Carla Little  01/19/2021, 6:02 PM

## 2021-01-19 NOTE — Progress Notes (Signed)
Speech Language Pathology Treatment: Dysphagia  Patient Details Name: Carla Little MRN: [id number] DOB: [birth year] Today's Date: 01/19/2021 Time: 9357-0177 SLP Time Calculation (min) (ACUTE ONLY): 33 min  Assessment / Plan / Recommendation Clinical Impression  Second session with pt with set up of oral suction for pt to use and implementation of effective compensation strategies - After extended time frame of pt not fully clearing her oral cavity despite efforts including oral swish and expectorate with water, dental brushing, consuming liquids (over approx at least 30 minute time frame), pt willing to consume ice cream to aid in oral clearance.  This was effective to clear last small parcel of chicken.  Pt made great effort today with her oral clearance following po intake. Advised advancing diet to regular to allow pt to choose food items she can manage/desire with strict aspiration precautions.  There are no indications of pharyngeal dysphagia from SLP observation today and pt gagged only once when using oral suction posterior in oral cavity.  Suspect xerostomia is contributing as well - thus liquids t/o meal and extra gravy/sauce may be helpful.  hopefully advancement will allow pt foods that she can more easily self feed to allow her extra time to eat *as staff will not be able to provide assist for length of SLP sessions today.  Pt with sensitive dentition - therefore will provide her with Sensodyne toothpaste to help with comfort.  Pt agreeable to plan using teach back and clarification of compensation strategies.    HPI HPI: Patient is a 30 year old female with morbid obesity, nonalcoholic fatty liver disease, GERD, depression, recently was hospitalized following a mechanical fall with a left knee dislocation, resultant MCL strain, ACL and PCL rupture then complicated by nausea vomiting, tachycardia, UTI and fecal impaction during the hospitalization. Progressive weakness during this  hospitalization. 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      SLP Plan  Continue with current plan of care      Recommendations for follow up therapy are one component of a multi-disciplinary discharge planning process, led by the attending physician.  Recommendations may be updated based on patient status, additional functional criteria and insurance authorization.    Recommendations  Diet recommendations: Regular;Thin liquid Liquids provided via: Straw;Cup Medication Administration: Other (Comment) (as tolerated) Compensations: Follow solids with liquid;Lingual sweep for clearance of pocketing;Other (Comment) (use puree/icecream, etc to help transit oral retention into phayrnx or finger sweep/oral suction to clear oral residuals) Postural Changes and/or Swallow Maneuvers: Seated upright 90 degrees;Upright 30-60 min after meal                Oral Care Recommendations:  (oral care after meals) Follow up Recommendations: Inpatient Rehab SLP Visit Diagnosis: Dysphagia, oral phase (R13.11) Plan: Continue with current plan of care       GO             Carla Infante, MS [facility] SLP Acute Rehab Services Office [phone] Pager [phone]    Carla Little  01/19/2021, 6:21 PM

## 2021-01-19 NOTE — Progress Notes (Signed)
Physical Therapy Treatment Patient Details Name: Carla Little MRN: [id number] DOB: 1991/01/17 Today's Date: 01/19/2021   History of Present Illness Patient is a 30 y.o. female who presented to [facility] for intractable N/V on 9/17. ED labwork reveals e. coli UTI. Pt had recent hospital admission from 8/8-9/5 due to Lt knee dislocation which was reduced with fractures of the proximal fibula ligamentous avulsion laterally as well as medially off the medial femoral condyle and MRI showed complete ACL & PCL tears and MCL strain. That hospital admission was complicated by nausea, vomiting, tachycardia, UTI, and fecal impaction;. PMH significant for morbid obesity, GERD, fatty liver disease, depression. Patient now with Guillian-Barre syndrome    PT Comments    Patient participated in UE exercises and  be mobility. Assisted to recliner via maxisky. Patient performed AA LE  exercises. Noted slight improvement in  active ankle and knee extensions.  Patient reports  feeling tingling and pins in lower legs. Feels therapists cold hands on the legs. The patient continues to  have less edema in  extremities and abdomen.  Recommendations for follow up therapy are one component of a multi-disciplinary discharge planning process, led by the attending physician.  Recommendations may be updated based on patient status, additional functional criteria and insurance authorization.  Follow Up Recommendations  Acute inpatient rehab (3hours/day)     Assistance Recommended at Discharge Frequent or constant Supervision/Assistance  Equipment Recommendations  Wheelchair (measurements PT);Wheelchair cushion (measurements PT)    Recommendations for Other Services       Precautions / Restrictions Precautions Precautions: Fall Precaution Comments: LE's remain very weak, not supporting in tilt to stand. Other Brace: does not need KI at this time but may consider since opatient has more weakness due GBS      Mobility  Bed Mobility   Bed Mobility: Rolling Rolling: Max assist;+2 for physical assistance;+2 for safety/equipment         General bed mobility comments: patient was able to particiapte  rolling x 3 each side, reaching for rails, using UE;s  to initiate rolling back onto back each side.    Transfers                   General transfer comment: transfer to recliner via Nurse, adult [place] (Stroke Patients Only)       Balance   Sitting-balance support: Bilateral upper extremity supported   Sitting balance - Comments: use UE's to pull forward to sit upright in recliner x 5 minutes.                                    Cognition Arousal/Alertness: Awake/alert Behavior During Therapy: Flat affect;WFL for tasks assessed/performed                                   General Comments: cheerful when participating in activities, patient remained  engaged in mobility and  therpay throughout  session.        Exercises General Exercises - Lower Extremity Ankle Circles/Pumps: AROM;Both;10 reps;Seated Long Arc Quad: AAROM;10 reps;Seated    General Comments  Pertinent Vitals/Pain Faces Pain Scale: Hurts whole lot Pain Location: legs Pain Descriptors / Indicators: Aching;Sore Pain Intervention(s): Monitored during session;Premedicated before session;Limited activity within patient's tolerance    Home Living                          Prior Function            PT Goals (current goals can now be found in the care plan section) Progress towards PT goals: Progressing toward goals    Frequency    Min 2X/week      PT Plan Current plan remains appropriate    Co-evaluation              AM-PAC PT "6 Clicks" Mobility   Outcome Measure  Help needed turning from your back to  your side while in a flat bed without using bedrails?: A Lot Help needed moving from lying on your back to sitting on the side of a flat bed without using bedrails?: A Lot Help needed moving to and from a bed to a chair (including a wheelchair)?: Total Help needed standing up from a chair using your arms (e.g., wheelchair or bedside chair)?: Total Help needed to walk in hospital room?: Total Help needed climbing 3-5 steps with a railing? : Total 6 Click Score: 8    End of Session Equipment Utilized During Treatment: Oxygen Activity Tolerance: Patient tolerated treatment well Patient left: in chair;with call bell/phone within reach;with family/visitor present Nurse Communication: Mobility status;Need for lift equipment PT Visit Diagnosis: Muscle weakness (generalized) (M62.81);Other symptoms and signs involving the nervous system (R29.898) Pain - Right/Left: Left Pain - part of body: Knee     Time: 1107-1200 PT Time Calculation (min) (ACUTE ONLY): 53 min  Charges:  $Therapeutic Activity: 23-37 mins $Neuromuscular Re-education: 23-37 mins                     Blanchard Kelch PT Acute Rehabilitation Services Pager [phone] Office [phone]    Rada Hay 01/19/2021, 2:39 PM

## 2021-01-19 NOTE — TOC Progression Note (Addendum)
Transition of Care [facility]) - Progression Note    Patient Details  Name: Carla Little MRN: [id number] Date of Birth: 12-Jun-1990  [facility] CM/SW Contact  Armanda Heritage, RN Phone Number: 01/19/2021, 12:50 PM  Clinical Narrative:    CM followed up with Lindaann Pascal regarding referral, facility has not made a decision at this time.  VM left for [facility] and spoke with Brayton Caves with [facility] [employer].  Per Velna Hatchet patient is likely over the maximum weight limit of the facility, however she will still present to administrator to see if there is any opportunity for an admission as a DTP.  [facility] reports they do not accept LOG patients. Call placed to [facility] and [facility], VM left.  Addendum: spoke with Whitney at [facility] and faxed referral over for review with an offer of a LOG.  Addendum 2: referral sent to [facility] for review.   Expected Discharge Plan: Skilled Nursing Facility Barriers to Discharge: SNF Pending bed offer  Expected Discharge Plan and Services Expected Discharge Plan: Skilled Nursing Facility                                               Social Determinants of Health (SDOH) Interventions    Readmission Risk Interventions No flowsheet data found.

## 2021-01-19 NOTE — Progress Notes (Signed)
Physical Therapy Treatment Patient Details Name: Carla Little MRN: [id number] DOB: 05-May-1990 Today's Date: 01/19/2021   History of Present Illness Patient is a 29 y.o. female who presented to [facility] for intractable N/V on 9/17. ED labwork reveals e. coli UTI. Pt had recent hospital admission from 8/8-9/5 due to Lt knee dislocation which was reduced with fractures of the proximal fibula ligamentous avulsion laterally as well as medially off the medial femoral condyle and MRI showed complete ACL & PCL tears and MCL strain. That hospital admission was complicated by nausea, vomiting, tachycardia, UTI, and fecal impaction;. PMH significant for morbid obesity, GERD, fatty liver disease, depression. Patient now with Guillian-Barre syndrome    PT Comments    Patient sat in recliner x 2.5 hours, woked on trunk control, repositioned legs in more dependency for comfort. Maxiky transfer to bed. Patient assisted with rolling x 2 . Continue NMR and mobility.  Recommendations for follow up therapy are one component of a multi-disciplinary discharge planning process, led by the attending physician.  Recommendations may be updated based on patient status, additional functional criteria and insurance authorization.  Follow Up Recommendations  Acute inpatient rehab (3hours/day)     Assistance Recommended at Discharge Frequent or constant Supervision/Assistance  Equipment Recommendations  Wheelchair (measurements PT);Wheelchair cushion (measurements PT)    Recommendations for Other Services       Precautions / Restrictions Precautions Precautions: Fall Precaution Comments: LE's remain very weak, not supporting in tilt to stand. Other Brace: does not need KI at this time but may consider since opatient has more weakness due GBS     Mobility  Bed Mobility   Bed Mobility: Rolling Rolling: Max assist;+2 for physical assistance;+2 for safety/equipment Sidelying to sit: Max assist;+2 for  safety/equipment;+2 for physical assistance;Total assist       General bed mobility comments: patient was able to particiapte  rolling x 2 each side, reaching for rails, using UE;s  to initiate rolling back onto back each side.    Transfers                   General transfer comment: transfer to bed via maxisky Transfer via Lift Equipment: [employer]  Ambulation/Gait                 Stairs             Wheelchair Mobility    Modified Rankin (Stroke Patients Only)       Balance   Sitting-balance support: Bilateral upper extremity supported   Sitting balance - Comments: patient able to lean forward in recliner. Patient sat x 2 hours in recliner.                                    Cognition Arousal/Alertness: Awake/alert Behavior During Therapy: Flat affect;WFL for tasks assessed/performed                                   General Comments: cheerful when participating in activities, patient remained  engaged in mobility and  therpay throughout  session.        Exercises General Exercises - Lower Extremity Ankle Circles/Pumps: AROM;Both;10 reps;Seated Long Arc Quad: AAROM;10 reps;Seated    General Comments        Pertinent Vitals/Pain Faces Pain Scale: Hurts little more Pain Location: legs Pain Descriptors / Indicators: Aching;Sore  Pain Intervention(s): Premedicated before session    Home Living                          Prior Function            PT Goals (current goals can now be found in the care plan section) Progress towards PT goals: Progressing toward goals    Frequency    Min 2X/week      PT Plan Current plan remains appropriate    Co-evaluation              AM-PAC PT "6 Clicks" Mobility   Outcome Measure  Help needed turning from your back to your side while in a flat bed without using bedrails?: A Lot Help needed moving from lying on your back to sitting on the side of a  flat bed without using bedrails?: A Lot Help needed moving to and from a bed to a chair (including a wheelchair)?: Total Help needed standing up from a chair using your arms (e.g., wheelchair or bedside chair)?: Total Help needed to walk in hospital room?: Total Help needed climbing 3-5 steps with a railing? : Total 6 Click Score: 8    End of Session Equipment Utilized During Treatment: Oxygen Activity Tolerance: Patient tolerated treatment well Patient left: in bed;with call bell/phone within reach Nurse Communication: Mobility status;Need for lift equipment PT Visit Diagnosis: Muscle weakness (generalized) (M62.81);Other symptoms and signs involving the nervous system (R29.898) Pain - Right/Left: Left Pain - part of body: Knee     Time: 1660-6301 PT Time Calculation (min) (ACUTE ONLY): 39 min  Charges:  $Therapeutic Activity: 23-37 mins $Neuromuscular Re-education: 23-37 mins                     Blanchard Kelch PT Acute Rehabilitation Services Pager [phone] Office [phone]    Rada Hay 01/19/2021, 3:06 PM

## 2021-01-20 LAB — BASIC METABOLIC PANEL
Anion gap: 11 (ref 5–15)
BUN: 12 mg/dL (ref 6–20)
CO2: 36 mmol/L — ABNORMAL HIGH (ref 22–32)
Calcium: 9.4 mg/dL (ref 8.9–10.3)
Chloride: 90 mmol/L — ABNORMAL LOW (ref 98–111)
Creatinine, Ser: 0.38 mg/dL — ABNORMAL LOW (ref 0.44–1.00)
GFR, Estimated: >60 mL/min (ref >60)
Glucose, Bld: 95 mg/dL (ref 70–99)
Potassium: 3.8 mmol/L (ref 3.5–5.1)
Sodium: 137 mmol/L (ref 135–145)

## 2021-01-20 NOTE — Progress Notes (Signed)
PROGRESS NOTE    Carla Little  OTL:[id number] DOB: 1990/04/11 DOA: 12/09/2020 PCP: Patient, No Pcp Per (Inactive)   Brief Narrative: Carla Little is a 30 y.o. female with a history of morbid obesity, recent left knee dislocation. Patient presented secondary to weakness with presentation concerning for Guillain Barre syndrome. Neurology consulted and patient started on IVIG. She was also found to have vitamin B12 and folate deficiencies. Patient is now awaiting rehab options for discharge.   Assessment & Plan:   Principal Problem:   Intractable vomiting with nausea Active Problems:   Left knee dislocation   PUD (peptic ulcer disease)   Nonalcoholic steatohepatitis (NASH)   Class 3 obesity (HCC)   Sinus tachycardia   Hypokalemia   GERD (gastroesophageal reflux disease)   Iron deficiency anemia due to chronic blood loss   E. coli UTI   Guillain Barr syndrome (HCC)   Guillain Barre syndrome Neuropathy Neurology consulted and recommended IVIG, for which patient completed 5 day course. Also recommended treating confounding diagnoses of vitamin B12 and folate deficiency.  -Continue to Lyrica 75 mg TID -Neurology (signed off 10/2): Vitamin B12 qweek for 4 weeks, then monthly Vitamin B12 injections.  Intractable nausea and vomiting Initially resolved, recurrent. Associated generalized abdominal pain. In setting of known gastritis. GI consulted and patient underwent EGD on 10/18 without etiology. Improved. Recommendation for outpatient general surgery follow-up for cholelithiasis.  Rectal inflammation Seen on CT imaging. Rectal thickening extending into rectosigmoid junction. Patient does mention pain with having bowel movements/wiping but otherwise did not have symptoms. She reports history of no anal related sexual activity. No systemic symptoms concerning for infection. GI consulted and flexible sigmoidoscopy performed on 10/18 which was significant for poor prep and no  obvious lesions except for hemorrhoids. GI signed off 10/19.  Depression/anxiety Evaluated by psychiatry -Continue Buspar 5 mg BID -Switched from Remeron to Wellbutrin XL  Shortness of breath Transient. Resolved.  Left knee dislocation Patient previously evaluated by orthopedic surgery with recommendation for hinge brace and to allow for joint scarring. Currently weight bearing as tolerated with limited flexion of left knee.  Sinus tachycardia This has been managed with metoprolol. Unsure of etiology. Possibly related to deconditioning. No concerning symptoms.  Iron deficiency anemia Related to heavy menstruation. Hemoglobin stable.  Elevated AST/ALT NAFLD Hyperbilirubinemia AST/ALT with initial trend upwards. No associated symptoms. Abdominal ultrasound (10/11) significant for fatty liver disease and cholelithiasis without cholecystitis. INR normal. Bilirubin stable and AST/ALT now stable but elevated. GI consulted.  B12 deficiency Folate deficiency B12 and folate of 153 and 3.2 respectively. Patient started on Vitamin B12 and folic acid supplementation  E. Coli UTI Completed treatment with Ceftriaxone and transitioned to Cefazolin.  Overnight hypoxia Likely patient has OSA and/or OHS judging by symptoms and body habitus. Currently managed with oxygen overnight while asleep. CT abdomen/pelvis significant for evidence of hypoventilation. Attempted to trial CPAP but patient declined treatment.  Hypocalcemia Mild and likely related to hypoalbuminemia.  Hypokalemia -Potassium supplementation as needed  Anasarca Likely related to hypoalbuminemia. No evidence of kidney or heart impairment. Liver does have some disease; slightly elevated bilirubin. No ascites on ultrasound. Patient treated with Lasix with some improvement; unfortunately, there has been some contraction alkalosis -Continue Lasix for now; BMP daily while on Lasix. If recurring alkalosis, will discontinue. -Daily  BMP  Epistaxis Possibly secondary to oxygen therapy. Unsure that she needs oxygen continuously during the day -CBC in AM -Try to limit oxygen use  Bilateral leg pain Low suspicion  for acute DVT, however pain is apparently new onset. Difficult to assess from examination secondary to body habitus. Venous duplex was negative for acute DVT, bilaterally.  Possible oral candidiasis Treated with Diflucan IV. Seems to be resolved.  Positive blood culture result 1/4 samples significant for staphylococcus epidermidis. Unlikely active infection.  Morbid obesity Body mass index is 70.24 kg/m. Dietitian consulted. -Dietitian recommendations (10/12): Liberalize diet to regular to promote oral intake Will request food be chopped into bite sized pieces and dining services alert nursing staff when tray is delivered Nursing staff to assist with set-up and feeeding of meals Increase Ensure Enlive po to TID, each supplement provides 350 kcal and 20 grams of protein Continue MVI regimen   DVT prophylaxis: Lovenox Code Status:   Code Status: Full Code Family Communication: None at bedside Disposition Plan: Discharge to SNF when bed is available if remains stable.   Consultants:  Neurology Psychiatry Gastroenterology  Procedures:  None  Antimicrobials: Ceftriaxone Ancef Diflucan Flagyl    Subjective: Was up in the chair for about 2 hours yesterday. Had a nose bleed overnight.  Objective: Vitals:   01/19/21 0521 01/19/21 1302 01/20/21 0900 01/20/21 1322  BP: 125/86 119/84  121/73  Pulse: (!) 107 96  97  Resp: 20 16  20   Temp: 99.4 F (37.4 C) 98.2 F (36.8 C)  97.8 F (36.6 C)  TempSrc:  Oral  Oral  SpO2: 97% 98%  100%  Weight: (!) 181.5 kg  (!) 185.7 kg   Height:        Intake/Output Summary (Last 24 hours) at 01/20/2021 1442 Last data filed at 01/20/2021 1052 Gross per 24 hour  Intake 8 ml  Output 1300 ml  Net -1292 ml    Filed Weights   01/18/21 0110 01/19/21  0521 01/20/21 0900  Weight: (!) 228 kg (!) 181.5 kg (!) 185.7 kg    Examination:  General exam: Appears calm and comfortable  Respiratory system: Clear to auscultation. Respiratory effort normal. Cardiovascular system: S1 & S2 heard, RRR. No murmurs, rubs, gallops or clicks. Gastrointestinal system: Abdomen is obese, soft and nontender. No organomegaly or masses felt. Normal bowel sounds heard. Central nervous system: Alert and oriented. No focal neurological deficits. Musculoskeletal: No calf tenderness Skin: No cyanosis. No rashes Psychiatry: Judgement and insight appear normal. Mood & affect appropriate.    Data Reviewed: I have personally reviewed following labs and imaging studies  CBC Lab Results  Component Value Date   WBC 10.1 01/16/2021   RBC 3.27 (L) 01/16/2021   HGB 9.5 (L) 01/16/2021   HCT 32.1 (L) 01/16/2021   MCV 98.2 01/16/2021   MCH 29.1 01/16/2021   PLT 523 (H) 01/16/2021   MCHC 29.6 (L) 01/16/2021   RDW 19.9 (H) 01/16/2021   LYMPHSABS 2.0 01/03/2021   MONOABS 0.9 01/03/2021   EOSABS 0.0 01/03/2021   BASOSABS 0.0 01/03/2021     Last metabolic panel Lab Results  Component Value Date   NA 137 01/20/2021   K 3.8 01/20/2021   CL 90 (L) 01/20/2021   CO2 36 (H) 01/20/2021   BUN 12 01/20/2021   CREATININE 0.38 (L) 01/20/2021   GLUCOSE 95 01/20/2021   GFRNONAA >60 01/20/2021   GFRAA >60 05/16/2018   CALCIUM 9.4 01/20/2021   PHOS 2.9 12/14/2020   PROT 9.1 (H) 01/16/2021   ALBUMIN 3.5 01/16/2021   BILITOT 1.6 (H) 01/16/2021   ALKPHOS 87 01/16/2021   AST 350 (H) 01/16/2021   ALT 123 (H) 01/16/2021  ANIONGAP 11 01/20/2021    CBG (last 3)  No results for input(s): GLUCAP in the last 72 hours.   GFR: Estimated Creatinine Clearance: 170.7 mL/min (A) (by C-G formula based on SCr of 0.38 mg/dL (L)).  Coagulation Profile: No results for input(s): INR, PROTIME in the last 168 hours.  No results found for this or any previous visit (from the past 240  hour(s)).       Radiology Studies: No results found.      Scheduled Meds:  (feeding supplement) PROSource Plus  30 mL Oral BID BM   buPROPion  150 mg Oral Daily   busPIRone  5 mg Oral BID   cyanocobalamin  1,000 mcg Intramuscular Weekly   enoxaparin (LOVENOX) injection  50 mg Subcutaneous Q12H   feeding supplement  237 mL Oral BID BM   folic acid  1 mg Oral Daily   furosemide  40 mg Intravenous BID   hydrocortisone  25 mg Rectal BID   lidocaine  1 patch Transdermal Daily   magic mouthwash w/lidocaine  5 mL Oral TID AC   metoprolol tartrate  25 mg Oral BID   multivitamin with minerals  1 tablet Oral Daily   pantoprazole  40 mg Oral BID   polyethylene glycol  17 g Oral Daily   potassium chloride  40 mEq Oral BID   pregabalin  75 mg Oral TID   Ensure Max Protein  11 oz Oral Daily   senna-docusate  1 tablet Oral BID   sodium chloride flush  3 mL Intravenous Q12H   Continuous Infusions:  sodium chloride 10 mL/hr at 01/07/21 1710     LOS: 41 days     Jacquelin Hawking, MD [facility] 01/20/2021, 2:42 PM  If 7PM-7AM, please contact night-coverage [email or web address]

## 2021-01-20 NOTE — Progress Notes (Signed)
Brought pt Sensodyne toothpaste for her dental sensitivity. Advised RN and requested pt use daily.  Thanks.  [name], MS [facility] SLP Acute Rehab Services Office [phone] Pager [phone]

## 2021-01-20 NOTE — Progress Notes (Signed)
Chaplain visited patient as follow-up. When she noted that another chaplain had attempted a visit, and she was sleeping, the patient said "Please wake me if you come by!"  Chaplain had conversation about patient's desire to watch "ASMR" videos YouTube.  She finds comfort and distraction in watching them. It soothes her. When asked if she hopes to have anyone visit her this weekend, she said "Noi, but i have my phone"   The patient was visited by a nurse during visit, who poured her OJ.   She said she didn't like to eat because the food wasn't good here, and made her nauseous.  She is drinking juices and ensure.  She says she's neither depressed or Ok right now. "Just kinda flat."  Patient asked for prayer, and chaplain prayed for her. Rev. Lynnell Chad Pager [phone]

## 2021-01-20 NOTE — Progress Notes (Signed)
Patient had nose bleed with large amounts of bright red blood and clots. Awaiting CBC results. Notified Garner Nash, NP. Will continue to monitor.

## 2021-01-20 NOTE — Progress Notes (Signed)
   01/20/21 1235  Mobility  Range of Motion/Exercises Left arm;Right arm  Mobility Response Tolerated well  Mobility performed by Mobility specialist  $Mobility charge 1 Mobility   Pt was agreeable to UE exercises this morning. She stated she was in some pain, and had not received her pain medication prior to the session. Pt persistent on doing exercises. Conducted lateral raises x5, cross body pulls x5, and rm extensions x5. All exercises were done bilaterally and with resistance band. Pt also completed arm extensions with balloon hits x15 each arm. Tolerated all exercises well. Pt brushed her teeth after the session with electric toothbrush. She did note that her hand were sore and painful more than usual today. Massaged pt's hands with some of her lotion, she was much appreciative. Notified RN of session, left pt in room with MD present.     Tim[employer]o [employer] Mobility Specialist Acute Rehab Services Office: [phone]

## 2021-01-21 LAB — BASIC METABOLIC PANEL
Anion gap: 9 (ref 5–15)
BUN: 14 mg/dL (ref 6–20)
CO2: 35 mmol/L — ABNORMAL HIGH (ref 22–32)
Calcium: 9.6 mg/dL (ref 8.9–10.3)
Chloride: 92 mmol/L — ABNORMAL LOW (ref 98–111)
Creatinine, Ser: 0.4 mg/dL — ABNORMAL LOW (ref 0.44–1.00)
GFR, Estimated: >60 mL/min (ref >60)
Glucose, Bld: 131 mg/dL — ABNORMAL HIGH (ref 70–99)
Potassium: 3.5 mmol/L (ref 3.5–5.1)
Sodium: 136 mmol/L (ref 135–145)

## 2021-01-21 LAB — CBC
HCT: 33 % — ABNORMAL LOW (ref 36.0–46.0)
Hemoglobin: 9.5 g/dL — ABNORMAL LOW (ref 12.0–15.0)
MCH: 28.4 pg (ref 26.0–34.0)
MCHC: 28.8 g/dL — ABNORMAL LOW (ref 30.0–36.0)
MCV: 98.5 fL (ref 80.0–100.0)
Platelets: 583 10*3/uL — ABNORMAL HIGH (ref 150–400)
RBC: 3.35 MIL/uL — ABNORMAL LOW (ref 3.87–5.11)
RDW: 19.8 % — ABNORMAL HIGH (ref 11.5–15.5)
WBC: 9.4 10*3/uL (ref 4.0–10.5)
nRBC: 0 % (ref 0.0–0.2)

## 2021-01-21 NOTE — Progress Notes (Signed)
PROGRESS NOTE    Carla Little  ZHG:[id number] DOB: Aug 01, 1990 DOA: 12/09/2020 PCP: Patient, No Pcp Per (Inactive)   Brief Narrative: Carla Little is a 30 y.o. female with a history of morbid obesity, recent left knee dislocation. Patient presented secondary to weakness with presentation concerning for Guillain Barre syndrome. Neurology consulted and patient started on IVIG. She was also found to have vitamin B12 and folate deficiencies. Patient is now awaiting rehab options for discharge.   Assessment & Plan:   Principal Problem:   Intractable vomiting with nausea Active Problems:   Left knee dislocation   PUD (peptic ulcer disease)   Nonalcoholic steatohepatitis (NASH)   Class 3 obesity (HCC)   Sinus tachycardia   Hypokalemia   GERD (gastroesophageal reflux disease)   Iron deficiency anemia due to chronic blood loss   E. coli UTI   Guillain Barr syndrome (HCC)   Guillain Barre syndrome Neuropathy Neurology consulted and recommended IVIG, for which patient completed 5 day course. Also recommended treating confounding diagnoses of vitamin B12 and folate deficiency.  -Continue to Lyrica 75 mg TID -Neurology (signed off 10/2): Vitamin B12 qweek for 4 weeks, then monthly Vitamin B12 injections.  Intractable nausea and vomiting Initially resolved, recurrent. Associated generalized abdominal pain. In setting of known gastritis. GI consulted and patient underwent EGD on 10/18 without etiology. Improved. Recommendation for outpatient general surgery follow-up for cholelithiasis.  Rectal inflammation Seen on CT imaging. Rectal thickening extending into rectosigmoid junction. Patient does mention pain with having bowel movements/wiping but otherwise did not have symptoms. She reports history of no anal related sexual activity. No systemic symptoms concerning for infection. GI consulted and flexible sigmoidoscopy performed on 10/18 which was significant for poor prep and no  obvious lesions except for hemorrhoids. GI signed off 10/19.  Depression/anxiety Evaluated by psychiatry -Continue Buspar 5 mg BID -Switched from Remeron to Wellbutrin XL  Shortness of breath Transient. Resolved.  Left knee dislocation Patient previously evaluated by orthopedic surgery with recommendation for hinge brace and to allow for joint scarring. Currently weight bearing as tolerated with limited flexion of left knee.  Sinus tachycardia This has been managed with metoprolol. Unsure of etiology. Possibly related to deconditioning. No concerning symptoms.  Iron deficiency anemia Related to heavy menstruation. Hemoglobin stable.  Elevated AST/ALT NAFLD Hyperbilirubinemia AST/ALT with initial trend upwards. No associated symptoms. Abdominal ultrasound (10/11) significant for fatty liver disease and cholelithiasis without cholecystitis. INR normal. Bilirubin stable and AST/ALT now stable but elevated. GI consulted.  B12 deficiency Folate deficiency B12 and folate of 153 and 3.2 respectively. Patient started on Vitamin B12 and folic acid supplementation  E. Coli UTI Completed treatment with Ceftriaxone and transitioned to Cefazolin.  Overnight hypoxia Likely patient has OSA and/or OHS judging by symptoms and body habitus. Currently managed with oxygen overnight while asleep. CT abdomen/pelvis significant for evidence of hypoventilation. Attempted to trial CPAP but patient declined treatment.  Hypocalcemia Mild and likely related to hypoalbuminemia.  Hypokalemia -Potassium supplementation as needed  Anasarca Likely related to hypoalbuminemia. No evidence of kidney or heart impairment. Liver does have some disease; slightly elevated bilirubin. No ascites on ultrasound. Patient treated with Lasix with some improvement; unfortunately, there has been some contraction alkalosis -Continue Lasix for now; BMP daily while on Lasix. If recurring alkalosis, will discontinue. -Daily  BMP  Epistaxis Possibly secondary to oxygen therapy. Unsure that she needs oxygen continuously during the day. Hemoglobin stable. -Try to limit oxygen use  Bilateral leg pain Low suspicion for  acute DVT, however pain is apparently new onset. Difficult to assess from examination secondary to body habitus. Venous duplex was negative for acute DVT, bilaterally.  Possible oral candidiasis Treated with Diflucan IV. Seems to be resolved.  Positive blood culture result 1/4 samples significant for staphylococcus epidermidis. Unlikely active infection.  Morbid obesity Body mass index is 73.87 kg/m. Dietitian consulted. -Dietitian recommendations (10/12): Liberalize diet to regular to promote oral intake Will request food be chopped into bite sized pieces and dining services alert nursing staff when tray is delivered Nursing staff to assist with set-up and feeeding of meals Increase Ensure Enlive po to TID, each supplement provides 350 kcal and 20 grams of protein Continue MVI regimen   DVT prophylaxis: Lovenox Code Status:   Code Status: Full Code Family Communication: None at bedside Disposition Plan: Discharge to SNF when bed is available if remains stable.   Consultants:  Neurology Psychiatry Gastroenterology  Procedures:  None  Antimicrobials: Ceftriaxone Ancef Diflucan Flagyl    Subjective: No issues noted overnight.  Objective: Vitals:   01/20/21 1322 01/20/21 2041 01/21/21 0500 01/21/21 0606  BP: 121/73 121/68  130/86  Pulse: 97 (!) 107  (!) 106  Resp: 20 20  16   Temp: 97.8 F (36.6 C) 98.2 F (36.8 C)  98 F (36.7 C)  TempSrc: Oral Oral  Oral  SpO2: 100% 98%  98%  Weight:   (!) 195.3 kg   Height:        Intake/Output Summary (Last 24 hours) at 01/21/2021 1217 Last data filed at 01/21/2021 0500 Gross per 24 hour  Intake --  Output 550 ml  Net -550 ml    Filed Weights   01/19/21 0521 01/20/21 0900 01/21/21 0500  Weight: (!) 181.5 kg (!) 185.7  kg (!) 195.3 kg    Examination:  General exam: Appears calm and comfortable   Data Reviewed: I have personally reviewed following labs and imaging studies  CBC Lab Results  Component Value Date   WBC 9.4 01/21/2021   RBC 3.35 (L) 01/21/2021   HGB 9.5 (L) 01/21/2021   HCT 33.0 (L) 01/21/2021   MCV 98.5 01/21/2021   MCH 28.4 01/21/2021   PLT 583 (H) 01/21/2021   MCHC 28.8 (L) 01/21/2021   RDW 19.8 (H) 01/21/2021   LYMPHSABS 2.0 01/03/2021   MONOABS 0.9 01/03/2021   EOSABS 0.0 01/03/2021   BASOSABS 0.0 01/03/2021     Last metabolic panel Lab Results  Component Value Date   NA 136 01/21/2021   K 3.5 01/21/2021   CL 92 (L) 01/21/2021   CO2 35 (H) 01/21/2021   BUN 14 01/21/2021   CREATININE 0.40 (L) 01/21/2021   GLUCOSE 131 (H) 01/21/2021   GFRNONAA >60 01/21/2021   GFRAA >60 05/16/2018   CALCIUM 9.6 01/21/2021   PHOS 2.9 12/14/2020   PROT 9.1 (H) 01/16/2021   ALBUMIN 3.5 01/16/2021   BILITOT 1.6 (H) 01/16/2021   ALKPHOS 87 01/16/2021   AST 350 (H) 01/16/2021   ALT 123 (H) 01/16/2021   ANIONGAP 9 01/21/2021    CBG (last 3)  No results for input(s): GLUCAP in the last 72 hours.   GFR: Estimated Creatinine Clearance: 176.7 mL/min (A) (by C-G formula based on SCr of 0.4 mg/dL (L)).  Coagulation Profile: No results for input(s): INR, PROTIME in the last 168 hours.  No results found for this or any previous visit (from the past 240 hour(s)).       Radiology Studies: No results found.  Scheduled Meds:  (feeding supplement) PROSource Plus  30 mL Oral BID BM   buPROPion  150 mg Oral Daily   busPIRone  5 mg Oral BID   cyanocobalamin  1,000 mcg Intramuscular Weekly   enoxaparin (LOVENOX) injection  50 mg Subcutaneous Q12H   feeding supplement  237 mL Oral BID BM   folic acid  1 mg Oral Daily   furosemide  40 mg Intravenous BID   hydrocortisone  25 mg Rectal BID   lidocaine  1 patch Transdermal Daily   magic mouthwash w/lidocaine  5 mL Oral TID  AC   metoprolol tartrate  25 mg Oral BID   multivitamin with minerals  1 tablet Oral Daily   pantoprazole  40 mg Oral BID   polyethylene glycol  17 g Oral Daily   potassium chloride  40 mEq Oral BID   pregabalin  75 mg Oral TID   Ensure Max Protein  11 oz Oral Daily   senna-docusate  1 tablet Oral BID   sodium chloride flush  3 mL Intravenous Q12H   Continuous Infusions:  sodium chloride 10 mL/hr at 01/07/21 1710     LOS: 42 days     Jacquelin Hawking, MD [facility] 01/21/2021, 12:17 PM  If 7PM-7AM, please contact night-coverage [email or web address]

## 2021-01-21 NOTE — Progress Notes (Signed)
   01/21/21 1958  Assess: MEWS Score  Temp 99.2 F (37.3 C)  BP 126/60  Pulse Rate (!) 116  Resp 20  SpO2 94 %  O2 Device Nasal Cannula  Assess: MEWS Score  MEWS Temp 0  MEWS Systolic 0  MEWS Pulse 2  MEWS RR 0  MEWS LOC 0  MEWS Score 2  MEWS Score Color Yellow  Assess: if the MEWS score is Yellow or Red  Were vital signs taken at a resting state? Yes  Focused Assessment No change from prior assessment  Does the patient meet 2 or more of the SIRS criteria? Yes  Does the patient have a confirmed or suspected source of infection? No  Provider and Rapid Response Notified? No  MEWS guidelines implemented *See Row Information* Yes  Treat  MEWS Interventions Administered scheduled meds/treatments;Other (Comment) (Oxygen 2L places back on)  Pain Scale 0-10  Pain Score 2  Interventions Patient refused interventions  Take Vital Signs  Increase Vital Sign Frequency  Yellow: Q 2hr X 2 then Q 4hr X 2, if remains yellow, continue Q 4hrs  Escalate  MEWS: Escalate Yellow: discuss with charge nurse/RN and consider discussing with provider and RRT (discussed with a second nurse Driss RN)  Notify: Charge Nurse/RN  Name of Charge Nurse/RN Notified Driss RN  Date Charge Nurse/RN Notified 01/21/21  Time Charge Nurse/RN Notified 2000  Document  Patient Outcome Stabilized after interventions  Progress note created (see row info) Yes  Assess: SIRS CRITERIA  SIRS Temperature  0  SIRS Pulse 1  SIRS Respirations  0  SIRS WBC 0  SIRS Score Sum  1

## 2021-01-21 NOTE — Progress Notes (Signed)
Occupational Therapy Treatment Patient Details Name: Carla Little MRN: [id number] DOB: July 12, 1990 Today's Date: 01/21/2021   History of present illness Patient is a 30 y.o. female who presented to [facility] for intractable N/V on 9/17. ED labwork reveals e. coli UTI. Pt had recent hospital admission from 8/8-9/5 due to Lt knee dislocation which was reduced with fractures of the proximal fibula ligamentous avulsion laterally as well as medially off the medial femoral condyle and MRI showed complete ACL & PCL tears and MCL strain. That hospital admission was complicated by nausea, vomiting, tachycardia, UTI, and fecal impaction;. PMH significant for morbid obesity, GERD, fatty liver disease, depression. Patient now with Guillian-Barre syndrome   OT comments  Patient's session focused on [facility] and bilateral hand coordination to increase patients ability to use RUE during functional tasks with theraputty. Patient tolerated session well. Patient's discharge plan remains appropriate at this time. OT will continue to follow acutely.     Recommendations for follow up therapy are one component of a multi-disciplinary discharge planning process, led by the attending physician.  Recommendations may be updated based on patient status, additional functional criteria and insurance authorization.    Follow Up Recommendations  Skilled nursing-short term rehab (<3 hours/day)    Assistance Recommended at Discharge    Equipment Recommendations  None recommended by OT    Recommendations for Other Services      Precautions / Restrictions Precautions Precautions: Fall Required Braces or Orthoses: Knee Immobilizer - Left Other Brace: does not need KI at this time but may consider since opatient has more weakness due GBS Restrictions Weight Bearing Restrictions: No LLE Weight Bearing: Weight bearing as tolerated Other Position/Activity Restrictions: PT Orders from Maureen Ralphs, MD; "WBAT Left LE, only up with a  walker, does not need brace at this standpoint"       Mobility Bed Mobility                    Transfers                         Balance                                           ADL either performed or assessed with clinical judgement   ADL                                               Vision       Perception     Praxis      Cognition                                                  Exercises Other Exercises Other Exercises: patient participated in [facility] challanges with theraputty to encourage [facility] on RUE. patient was able to practice two challanges with theraputty during this session. HEP was left in room with super soft theraputty Other Exercises: patient used theraputty to encouarge bilateral hand coordination to increase use of dominant RUE during activities. patient was able to stabilize game items to be picked up with RUE to  take time to work on pincer graps with RUE. patient required increased time to participate in task.   Shoulder Instructions       General Comments      Pertinent Vitals/ Pain          Home Living                                          Prior Functioning/Environment              Frequency  Min 2X/week        Progress Toward Goals  OT Goals(current goals can now be found in the care plan section)  Progress towards OT goals: Progressing toward goals  Acute Rehab OT Goals OT Goal Formulation: With patient Time For Goal Achievement: 02/04/21 Potential to Achieve Goals: Fair  Plan Discharge plan remains appropriate    Co-evaluation                 AM-PAC OT "6 Clicks" Daily Activity     Outcome Measure   Help from another person eating meals?: A Little Help from another person taking care of personal grooming?: A Little Help from another person toileting, which includes using toliet, bedpan, or urinal?: Total Help  from another person bathing (including washing, rinsing, drying)?: A Lot Help from another person to put on and taking off regular upper body clothing?: Total Help from another person to put on and taking off regular lower body clothing?: Total 6 Click Score: 11    End of Session    OT Visit Diagnosis: Other abnormalities of gait and mobility (R26.89);History of falling (Z91.81);Pain;Muscle weakness (generalized) (M62.81)   Activity Tolerance Patient tolerated treatment well   Patient Left in bed;with call bell/phone within reach   Nurse Communication Other (comment) (nurse was present for some of session)        Time: 1426-1510 OT Time Calculation (min): 44 min  Charges: OT General Charges $OT Visit: 1 Visit OT Treatments $Therapeutic Activity: 38-52 mins  Sharyn Blitz OTR/L, MS Acute Rehabilitation Department Office# [phone] Pager# [phone]   Chalmers Guest Reece Mcbroom 01/21/2021, 4:30 PM

## 2021-01-21 NOTE — Progress Notes (Signed)
Mobility Specialist - Progress Note    01/21/21 1407  Mobility  Head of Bed Elevated  HOB greater than 30  Activity Turned to right side;Turned to left side  Range of Motion/Exercises Left arm;Right arm  Level of Assistance Modified independent, requires aide device or extra time  Assistive Device None  Minutes Sat in Chair 20 minutes  Mobility Sit up in bed/chair position for meals  Mobility Response Tolerated well  Mobility performed by Mobility specialist  $Mobility charge 1 Mobility   Upon entry pt was agreeable to complete UE exercises. Pt's bed positioned to help her sit up and then pt completed x10 lateral arm raises, cross body raises, bicep curls, and overhead arm raises on each arm. Pt requested to brush teeth during session and used wash cloth to wash face once completed. Pt used lotion to apply to arms and hands and required assistance from mobility specialist to lotion legs and feet. Pt then required clean up during session and received assistance from RN and 2 NT's to help with clean up and changed pt's gown. Pt was left in bed with call bell at side.   Arliss Journey Mobility Specialist Acute Rehabilitation Services Phone: [phone] 01/21/21, 2:13 PM

## 2021-01-22 LAB — BASIC METABOLIC PANEL
Anion gap: 9 (ref 5–15)
BUN: 13 mg/dL (ref 6–20)
CO2: 36 mmol/L — ABNORMAL HIGH (ref 22–32)
Calcium: 9.5 mg/dL (ref 8.9–10.3)
Chloride: 92 mmol/L — ABNORMAL LOW (ref 98–111)
Creatinine, Ser: 0.37 mg/dL — ABNORMAL LOW (ref 0.44–1.00)
GFR, Estimated: >60 mL/min (ref >60)
Glucose, Bld: 105 mg/dL — ABNORMAL HIGH (ref 70–99)
Potassium: 3.5 mmol/L (ref 3.5–5.1)
Sodium: 137 mmol/L (ref 135–145)

## 2021-01-22 LAB — CBC
HCT: 31.4 % — ABNORMAL LOW (ref 36.0–46.0)
Hemoglobin: 9.4 g/dL — ABNORMAL LOW (ref 12.0–15.0)
MCH: 28.6 pg (ref 26.0–34.0)
MCHC: 29.9 g/dL — ABNORMAL LOW (ref 30.0–36.0)
MCV: 95.4 fL (ref 80.0–100.0)
Platelets: 561 10*3/uL — ABNORMAL HIGH (ref 150–400)
RBC: 3.29 MIL/uL — ABNORMAL LOW (ref 3.87–5.11)
RDW: 19.7 % — ABNORMAL HIGH (ref 11.5–15.5)
WBC: 11.6 10*3/uL — ABNORMAL HIGH (ref 4.0–10.5)
nRBC: 0 % (ref 0.0–0.2)

## 2021-01-22 MED ORDER — CYANOCOBALAMIN 1000 MCG/ML IJ SOLN: 1000.0000 ug | INTRAMUSCULAR | Status: DC

## 2021-01-22 NOTE — Progress Notes (Signed)
PROGRESS NOTE    Carla Little  [id number] DOB: [birth year] DOA: 12/09/2020 PCP: Patient, No Pcp Per (Inactive)   Brief Narrative: Carla Little is a 30 y.o. female with a history of morbid obesity, recent left knee dislocation. Patient presented secondary to weakness with presentation concerning for Guillain Barre syndrome. Neurology consulted and patient started on IVIG. She was also found to have vitamin B12 and folate deficiencies. Patient is now awaiting rehab options for discharge.   Assessment & Plan:   Principal Problem:   Intractable vomiting with nausea Active Problems:   Left knee dislocation   PUD (peptic ulcer disease)   Nonalcoholic steatohepatitis (NASH)   Class 3 obesity (HCC)   Sinus tachycardia   Hypokalemia   GERD (gastroesophageal reflux disease)   Iron deficiency anemia due to chronic blood loss   E. coli UTI   Guillain Barr syndrome ([place])   Guillain Barre syndrome Neuropathy Neurology consulted and recommended IVIG, for which patient completed 5 day course. Also recommended treating confounding diagnoses of vitamin B12 and folate deficiency.  -Continue to Lyrica 75 mg TID -Vitamin B12 injections q monthly  Intractable nausea and vomiting Initially resolved, recurrent. Associated generalized abdominal pain. In setting of known gastritis. GI consulted and patient underwent EGD on 10/18 without etiology. Improved. Recommendation for outpatient general surgery follow-up for cholelithiasis.  Rectal inflammation Seen on CT imaging. Rectal thickening extending into rectosigmoid junction. Patient does mention pain with having bowel movements/wiping but otherwise did not have symptoms. She reports history of no anal related sexual activity. No systemic symptoms concerning for infection. GI consulted and flexible sigmoidoscopy performed on 10/18 which was significant for poor prep and no obvious lesions except for hemorrhoids. GI signed off  10/19.  Depression/anxiety Evaluated by psychiatry -Continue Buspar 5 mg BID -Switched from Remeron to Wellbutrin XL  Shortness of breath Transient. Resolved.  Left knee dislocation Patient previously evaluated by orthopedic surgery with recommendation for hinge brace and to allow for joint scarring. Currently weight bearing as tolerated with limited flexion of left knee.  Sinus tachycardia This has been managed with metoprolol. Unsure of etiology. Possibly related to deconditioning. No concerning symptoms.  Iron deficiency anemia Related to heavy menstruation. Hemoglobin stable.  Elevated AST/ALT NAFLD Hyperbilirubinemia AST/ALT with initial trend upwards. No associated symptoms. Abdominal ultrasound (10/11) significant for fatty liver disease and cholelithiasis without cholecystitis. INR normal. Bilirubin stable and AST/ALT now stable but elevated. GI consulted.  B12 deficiency Folate deficiency B12 and folate of 153 and 3.2 respectively. Patient started on Vitamin [id number] and folic acid supplementation  E. Coli UTI Completed treatment with Ceftriaxone and transitioned to Cefazolin.  Overnight hypoxia Likely patient has OSA and/or OHS judging by symptoms and body habitus. Currently managed with oxygen overnight while asleep. CT abdomen/pelvis significant for evidence of hypoventilation. Attempted to trial CPAP but patient declined treatment.  Hypocalcemia Mild and likely related to hypoalbuminemia.  Hypokalemia -Potassium supplementation as needed  Anasarca Likely related to hypoalbuminemia. No evidence of kidney or heart impairment. Liver does have some disease; slightly elevated bilirubin. No ascites on ultrasound. Patient treated with Lasix with some improvement; unfortunately, there has been some contraction alkalosis -Continue Lasix for now; BMP daily while on Lasix. If recurring alkalosis, will discontinue. -Daily BMP  Epistaxis Possibly secondary to oxygen therapy.  Unsure that she needs oxygen continuously during the day. Hemoglobin stable. -Try to limit oxygen use  Bilateral leg pain Low suspicion for acute DVT, however pain is apparently new onset. Difficult to  assess from examination secondary to body habitus. Venous duplex was negative for acute DVT, bilaterally.  Possible oral candidiasis Treated with Diflucan IV. Seems to be resolved.  Positive blood culture result 1/4 samples significant for staphylococcus epidermidis. Unlikely active infection.  Morbid obesity Body mass index is 69.06 kg/m. Dietitian consulted. -Dietitian recommendations (10/12): Liberalize diet to regular to promote oral intake Will request food be chopped into bite sized pieces and dining services alert nursing staff when tray is delivered Nursing staff to assist with set-up and feeeding of meals Increase Ensure Enlive po to TID, each supplement provides 350 kcal and 20 grams of protein Continue MVI regimen   DVT prophylaxis: Lovenox Code Status:   Code Status: Full Code Family Communication: None at bedside Disposition Plan: Discharge to SNF when bed is available if remains stable.   Consultants:  Neurology Psychiatry Gastroenterology  Procedures:  None  Antimicrobials: Ceftriaxone Ancef Diflucan Flagyl    Subjective: No concerns this morning. Plan for PT tomorrow.  Objective: Vitals:   01/22/21 0003 01/22/21 0321 01/22/21 0336 01/22/21 0811  BP: 120/77 125/79  118/85  Pulse: 76 (!) 103  (!) 105  Resp: 14 20  16   Temp: 98.5 F (36.9 C) 97.6 F (36.4 C)  98.2 F (36.8 C)  TempSrc: Oral Oral    SpO2: 92% 99%  99%  Weight:   (!) 182.6 kg   Height:        Intake/Output Summary (Last 24 hours) at 01/22/2021 1226 Last data filed at 01/21/2021 2015 Gross per 24 hour  Intake --  Output 400 ml  Net -400 ml    Filed Weights   01/20/21 0900 01/21/21 0500 01/22/21 0336  Weight: (!) 185.7 kg (!) 195.3 kg (!) 182.6 kg     Examination:  General: Well appearing, no distress   Data Reviewed: I have personally reviewed following labs and imaging studies  CBC Lab Results  Component Value Date   WBC 11.6 (H) 01/22/2021   RBC 3.29 (L) 01/22/2021   HGB 9.4 (L) 01/22/2021   HCT 31.4 (L) 01/22/2021   MCV 95.4 01/22/2021   MCH 28.6 01/22/2021   PLT 561 (H) 01/22/2021   MCHC 29.9 (L) 01/22/2021   RDW 19.7 (H) 01/22/2021   LYMPHSABS 2.0 01/03/2021   MONOABS 0.9 01/03/2021   EOSABS 0.0 01/03/2021   BASOSABS 0.0 99991111     Last metabolic panel Lab Results  Component Value Date   NA 137 01/22/2021   K 3.5 01/22/2021   CL 92 (L) 01/22/2021   CO2 36 (H) 01/22/2021   BUN 13 01/22/2021   CREATININE 0.37 (L) 01/22/2021   GLUCOSE 105 (H) 01/22/2021   GFRNONAA >60 01/22/2021   GFRAA >60 05/16/2018   CALCIUM 9.5 01/22/2021   PHOS 2.9 12/14/2020   PROT 9.1 (H) 01/16/2021   ALBUMIN 3.5 01/16/2021   BILITOT 1.6 (H) 01/16/2021   ALKPHOS 87 01/16/2021   AST 350 (H) 01/16/2021   ALT 123 (H) 01/16/2021   ANIONGAP 9 01/22/2021    CBG (last 3)  No results for input(s): GLUCAP in the last 72 hours.   GFR: Estimated Creatinine Clearance: 168.8 mL/min (A) (by C-G formula based on SCr of 0.37 mg/dL (L)).  Coagulation Profile: No results for input(s): INR, PROTIME in the last 168 hours.  No results found for this or any previous visit (from the past 240 hour(s)).       Radiology Studies: No results found.      Scheduled Meds:  (feeding  supplement) PROSource Plus  30 mL Oral BID BM   buPROPion  150 mg Oral Daily   busPIRone  5 mg Oral BID   enoxaparin (LOVENOX) injection  50 mg Subcutaneous Q12H   feeding supplement  237 mL Oral BID BM   folic acid  1 mg Oral Daily   furosemide  40 mg Intravenous BID   hydrocortisone  25 mg Rectal BID   lidocaine  1 patch Transdermal Daily   magic mouthwash w/lidocaine  5 mL Oral TID AC   metoprolol tartrate  25 mg Oral BID   multivitamin with  minerals  1 tablet Oral Daily   pantoprazole  40 mg Oral BID   polyethylene glycol  17 g Oral Daily   potassium chloride  40 mEq Oral BID   pregabalin  75 mg Oral TID   Ensure Max Protein  11 oz Oral Daily   senna-docusate  1 tablet Oral BID   sodium chloride flush  3 mL Intravenous Q12H   Continuous Infusions:  sodium chloride 10 mL/hr at 01/07/21 1710     LOS: 43 days     Jacquelin Hawking, MD [facility] 01/22/2021, 12:26 PM  If 7PM-7AM, please contact night-coverage [email or web address]

## 2021-01-23 LAB — BASIC METABOLIC PANEL
Anion gap: 8 (ref 5–15)
BUN: 15 mg/dL (ref 6–20)
CO2: 35 mmol/L — ABNORMAL HIGH (ref 22–32)
Calcium: 9.4 mg/dL (ref 8.9–10.3)
Chloride: 92 mmol/L — ABNORMAL LOW (ref 98–111)
Creatinine, Ser: <0.3 mg/dL — ABNORMAL LOW (ref 0.44–1.00)
Glucose, Bld: 104 mg/dL — ABNORMAL HIGH (ref 70–99)
Potassium: 3.8 mmol/L (ref 3.5–5.1)
Sodium: 135 mmol/L (ref 135–145)

## 2021-01-23 LAB — CBC
HCT: 31.7 % — ABNORMAL LOW (ref 36.0–46.0)
Hemoglobin: 9.6 g/dL — ABNORMAL LOW (ref 12.0–15.0)
MCH: 28.7 pg (ref 26.0–34.0)
MCHC: 30.3 g/dL (ref 30.0–36.0)
MCV: 94.9 fL (ref 80.0–100.0)
Platelets: 528 10*3/uL — ABNORMAL HIGH (ref 150–400)
RBC: 3.34 MIL/uL — ABNORMAL LOW (ref 3.87–5.11)
RDW: 19.7 % — ABNORMAL HIGH (ref 11.5–15.5)
WBC: 10.2 10*3/uL (ref 4.0–10.5)
nRBC: 0 % (ref 0.0–0.2)

## 2021-01-23 NOTE — TOC Progression Note (Signed)
Transition of Care [facility]) - Progression Note    Patient Details  Name: LAURIAN EDRINGTON MRN: [id number] Date of Birth: [birth year]  Transition [facility] CM/SW Contact  Armanda Heritage, RN Phone Number: 01/23/2021, 12:34 PM  Clinical Narrative:    Follow up calls placed to [facility] (facility has not reached a decision yet), [facility] (VM left) and [facility] (admissions rep is out to day with no VM options).  TOC will continue facility search.   Expected Discharge Plan: Skilled Nursing Facility Barriers to Discharge: SNF Pending bed offer  Expected Discharge Plan and Services Expected Discharge Plan: Skilled Nursing Facility                                               Social Determinants of Health (SDOH) Interventions    Readmission Risk Interventions No flowsheet data found.

## 2021-01-23 NOTE — Progress Notes (Signed)
Physical Therapy Treatment Patient Details Name: Carla Little MRN: [id number] DOB: 1990-07-07 Today's Date: 01/23/2021   History of Present Illness Patient is a 30 y.o. female who presented to [facility] for intractable N/V on 9/17. ED labwork reveals e. coli UTI. Pt had recent hospital admission from 8/8-9/5 due to Lt knee dislocation which was reduced with fractures of the proximal fibula ligamentous avulsion laterally as well as medially off the medial femoral condyle and MRI showed complete ACL & PCL tears and MCL strain. That hospital admission was complicated by nausea, vomiting, tachycardia, UTI, and fecal impaction;. PMH significant for morbid obesity, GERD, fatty liver disease, depression. Patient now with Guillian-Barre syndrome    PT Comments    Patient  reports feeling "stiff." Just received pain meds but patient stated" I will work through it."  Patient assisted with rolling to each side for pericare.  Patient with noted decreased edema in all extremities and abdomen.  Patient worked on sitting forward, maintaining unsupported sitting  x 30 ' with 2 short rest breaks. Patient  brushed teeth sitting upright. Patient with noted decreased manipulation of tooth paste tube and cap  but uses electric brush well.   Noted holding 2 cups but spilling 1 while  spitting in the other.  Patient reports that  her sensation in  limbs and face remain impaired. Did not feel cold water on abdomen and leg when spilled.    Patient tolerated total  40 minutes of activity, exercises with theraband and sitting upright in bed. Plan stand tilt  next visit if tolerated.  Patient with improved rotations of the legs and quad contractions but difficulty with hip flexion with body habitus and gravity.  Recommendations for follow up therapy are one component of a multi-disciplinary discharge planning process, led by the attending physician.  Recommendations may be updated based on patient status, additional  functional criteria and insurance authorization.  Follow Up Recommendations  Skilled nursing-short term rehab (<3 hours/day)     Assistance Recommended at Discharge Frequent or constant Supervision/Assistance  Equipment Recommendations  Wheelchair (measurements PT);Wheelchair cushion (measurements PT)    Recommendations for Other Services       Precautions / Restrictions Precautions Precautions: Fall Precaution Comments: LE's remain very weak, not supporting in tilt to stand. Other Brace: does not need KI at this time but may consider since opatient has more weakness due GBS     Mobility  Bed Mobility   Bed Mobility: Rolling Rolling: Max assist;+2 for physical assistance;+2 for safety/equipment Sidelying to sit: Max assist;+2 for safety/equipment;+2 for physical assistance;Total assist Supine to sit: HOB elevated;+2 for physical assistance;Min assist     General bed mobility comments: patient was able to particiapte  rolling x 2 each side, reaching for rails, using UE;s  to initiate rolling back onto back each side.    Transfers                        Ambulation/Gait                 Stairs             Wheelchair Mobility    Modified Rankin (Stroke Patients Only)       Balance       Sitting balance - Comments: worked on balance in bed chair  upright posuion x 30 minutes with 2 rest breaks  Cognition Arousal/Alertness: Awake/alert Behavior During Therapy: Flat affect;WFL for tasks assessed/performed                                   General Comments: appears less cheerful, reports stiffness and pain( just got medication)        Exercises General Exercises - Upper Extremity Shoulder Flexion: Strengthening;Both;10 reps;Theraband;Seated Theraband Level (Shoulder Flexion): Level 2 (Red) Shoulder Extension: Strengthening;10 reps;Seated Theraband Level (Shoulder  Extension): Level 2 (Red) Shoulder ADduction: Both;10 reps;Strengthening;Theraband Shoulder Horizontal ABduction: 10 reps;Strengthening;Both;Theraband General Exercises - Lower Extremity Ankle Circles/Pumps: AROM;AAROM;Both;15 reps Other Exercises Other Exercises: trunk rotation x 5 to each side and forward stretch, reaching UE's forward toward feet. maintained x 1 minute intervals x 5.    General Comments        Pertinent Vitals/Pain Faces Pain Scale: Hurts even more Pain Location: legs, hands, back Pain Descriptors / Indicators: Tightness Pain Intervention(s): Monitored during session;RN gave pain meds during session    Home Living                          Prior Function            PT Goals (current goals can now be found in the care plan section) Progress towards PT goals: Progressing toward goals    Frequency    Min 2X/week      PT Plan Current plan remains appropriate    Co-evaluation              AM-PAC PT "6 Clicks" Mobility   Outcome Measure  Help needed turning from your back to your side while in a flat bed without using bedrails?: A Lot Help needed moving from lying on your back to sitting on the side of a flat bed without using bedrails?: A Lot Help needed moving to and from a bed to a chair (including a wheelchair)?: Total Help needed standing up from a chair using your arms (e.g., wheelchair or bedside chair)?: Total Help needed to walk in hospital room?: Total Help needed climbing 3-5 steps with a railing? : Total 6 Click Score: 8    End of Session Equipment Utilized During Treatment: Oxygen Activity Tolerance: Patient tolerated treatment well Patient left: in bed;with call bell/phone within reach Nurse Communication: Mobility status;Need for lift equipment PT Visit Diagnosis: Muscle weakness (generalized) (M62.81);Other symptoms and signs involving the nervous system (R29.898) Pain - Right/Left: Left Pain - part of body: Knee      Time: 1209-1320 PT Time Calculation (min) (ACUTE ONLY): 71 min  Charges:  $Therapeutic Exercise: 8-22 mins $Therapeutic Activity: 23-37 mins $Neuromuscular Re-education: 23-37 mins                     .Carla Little PT Acute Rehabilitation Services Pager [phone] Office [phone]    Carla Little 01/23/2021, 3:10 PM

## 2021-01-23 NOTE — Progress Notes (Signed)
PROGRESS NOTE    Carla Little  [id number] DOB: [birth year] DOA: 12/09/2020 PCP: Patient, No Pcp Per (Inactive)   Brief Narrative: Carla Little is a 30 y.o. female with a history of morbid obesity, recent left knee dislocation. Patient presented secondary to weakness with presentation concerning for Guillain Barre syndrome. Neurology consulted and patient started on IVIG. She was also found to have vitamin B12 and folate deficiencies. Patient is now awaiting rehab options for discharge.   Assessment & Plan:   Principal Problem:   Intractable vomiting with nausea Active Problems:   Left knee dislocation   PUD (peptic ulcer disease)   Nonalcoholic steatohepatitis (NASH)   Class 3 obesity (HCC)   Sinus tachycardia   Hypokalemia   GERD (gastroesophageal reflux disease)   Iron deficiency anemia due to chronic blood loss   E. coli UTI   Guillain Barr syndrome ([place])   Guillain Barre syndrome Neuropathy Neurology consulted and recommended IVIG, for which patient completed 5 day course. Also recommended treating confounding diagnoses of vitamin B12 and folate deficiency.  -Continue to Lyrica 75 mg TID -Vitamin B12 injections q monthly  Intractable nausea and vomiting Initially resolved, recurrent. Associated generalized abdominal pain. In setting of known gastritis. GI consulted and patient underwent EGD on 10/18 without etiology. Improved. Recommendation for outpatient general surgery follow-up for cholelithiasis.  Rectal inflammation Seen on CT imaging. Rectal thickening extending into rectosigmoid junction. Patient does mention pain with having bowel movements/wiping but otherwise did not have symptoms. She reports history of no anal related sexual activity. No systemic symptoms concerning for infection. GI consulted and flexible sigmoidoscopy performed on 10/18 which was significant for poor prep and no obvious lesions except for hemorrhoids. GI signed off  10/19.  Depression/anxiety Evaluated by psychiatry -Continue Buspar 5 mg BID -Switched from Remeron to Wellbutrin XL  Shortness of breath Transient. Resolved.  Left knee dislocation Patient previously evaluated by orthopedic surgery with recommendation for hinge brace and to allow for joint scarring. Currently weight bearing as tolerated with limited flexion of left knee.  Sinus tachycardia This has been managed with metoprolol. Unsure of etiology. Possibly related to deconditioning. No concerning symptoms.  Iron deficiency anemia Related to heavy menstruation. Hemoglobin stable.  Elevated AST/ALT NAFLD Hyperbilirubinemia AST/ALT with initial trend upwards. No associated symptoms. Abdominal ultrasound (10/11) significant for fatty liver disease and cholelithiasis without cholecystitis. INR normal. Bilirubin stable and AST/ALT now stable but elevated. GI consulted.  B12 deficiency Folate deficiency B12 and folate of 153 and 3.2 respectively. Patient started on Vitamin 123456 and folic acid supplementation  E. Coli UTI Completed treatment with Ceftriaxone and transitioned to Cefazolin.  Overnight hypoxia Likely patient has OSA and/or OHS judging by symptoms and body habitus. Currently managed with oxygen overnight while asleep. CT abdomen/pelvis significant for evidence of hypoventilation. Attempted to trial CPAP but patient declined treatment.  Hypocalcemia Mild and likely related to hypoalbuminemia.  Hypokalemia -Potassium supplementation as needed  Anasarca Likely related to hypoalbuminemia. No evidence of kidney or heart impairment. Liver does have some disease; slightly elevated bilirubin. No ascites on ultrasound. Patient treated with Lasix with some improvement; unfortunately, there has been some contraction alkalosis -Continue Lasix for now; BMP daily while on Lasix. If recurring alkalosis, will discontinue. -Daily BMP  Epistaxis Possibly secondary to oxygen therapy.  Unsure that she needs oxygen continuously during the day. Hemoglobin stable. -Try to limit oxygen use  Bilateral leg pain Low suspicion for acute DVT, however pain is apparently new onset. Difficult to  assess from examination secondary to body habitus. Venous duplex was negative for acute DVT, bilaterally.  Possible oral candidiasis Treated with Diflucan IV. Seems to be resolved.  Positive blood culture result 1/4 samples significant for staphylococcus epidermidis. Unlikely active infection.  Morbid obesity Body mass index is 68.8 kg/m. Dietitian consulted. -Dietitian recommendations (10/12): Liberalize diet to regular to promote oral intake Will request food be chopped into bite sized pieces and dining services alert nursing staff when tray is delivered Nursing staff to assist with set-up and feeeding of meals Increase Ensure Enlive po to TID, each supplement provides 350 kcal and 20 grams of protein Continue MVI regimen   DVT prophylaxis: Lovenox Code Status:   Code Status: Full Code Family Communication: None at bedside Disposition Plan: Discharge to SNF when bed is available if remains stable.   Consultants:  Neurology Psychiatry Gastroenterology  Procedures:  None  Antimicrobials: Ceftriaxone Ancef Diflucan Flagyl    Subjective: No issues overnight. Ready for therapy this afternoon  Objective: Vitals:   01/22/21 1404 01/22/21 2100 01/23/21 0500 01/23/21 0616  BP: 116/68 119/84  119/72  Pulse: 96 (!) 105  (!) 101  Resp: 19 20  20   Temp: 98.2 F (36.8 C) 99.2 F (37.3 C)  99.1 F (37.3 C)  TempSrc: Oral Oral  Oral  SpO2: 94% 98%  99%  Weight:   (!) 181.9 kg   Height:        Intake/Output Summary (Last 24 hours) at 01/23/2021 1335 Last data filed at 01/23/2021 0500 Gross per 24 hour  Intake --  Output 550 ml  Net -550 ml    Filed Weights   01/21/21 0500 01/22/21 0336 01/23/21 0500  Weight: (!) 195.3 kg (!) 182.6 kg (!) 181.9 kg     Examination:  General exam: Appears calm and comfortable Respiratory system: Clear to auscultation. Respiratory effort normal. Cardiovascular system: S1 & S2 heard, RRR. No murmurs, rubs, gallops or clicks. Gastrointestinal system: Obese but abdomen appears to be nondistended, soft and nontender. No organomegaly or masses felt. Normal bowel sounds heard. Central nervous system: Alert and oriented. No focal neurological deficits. Musculoskeletal: BLE edema. No calf tenderness Skin: No cyanosis. No rashes Psychiatry: Judgement and insight appear normal. Mood & affect appropriate.    Data Reviewed: I have personally reviewed following labs and imaging studies  CBC Lab Results  Component Value Date   WBC 10.2 01/23/2021   RBC 3.34 (L) 01/23/2021   HGB 9.6 (L) 01/23/2021   HCT 31.7 (L) 01/23/2021   MCV 94.9 01/23/2021   MCH 28.7 01/23/2021   PLT 528 (H) 01/23/2021   MCHC 30.3 01/23/2021   RDW 19.7 (H) 01/23/2021   LYMPHSABS 2.0 01/03/2021   MONOABS 0.9 01/03/2021   EOSABS 0.0 01/03/2021   BASOSABS 0.0 01/03/2021     Last metabolic panel Lab Results  Component Value Date   NA 135 01/23/2021   K 3.8 01/23/2021   CL 92 (L) 01/23/2021   CO2 35 (H) 01/23/2021   BUN 15 01/23/2021   CREATININE <0.30 (L) 01/23/2021   GLUCOSE 104 (H) 01/23/2021   GFRNONAA NOT CALCULATED 01/23/2021   GFRAA >60 05/16/2018   CALCIUM 9.4 01/23/2021   PHOS 2.9 12/14/2020   PROT 9.1 (H) 01/16/2021   ALBUMIN 3.5 01/16/2021   BILITOT 1.6 (H) 01/16/2021   ALKPHOS 87 01/16/2021   AST 350 (H) 01/16/2021   ALT 123 (H) 01/16/2021   ANIONGAP 8 01/23/2021    CBG (last 3)  No results for input(s):  GLUCAP in the last 72 hours.   GFR: CrCl cannot be calculated (This lab value cannot be used to calculate CrCl because it is not a number: <0.30).  Coagulation Profile: No results for input(s): INR, PROTIME in the last 168 hours.  No results found for this or any previous visit (from the past 240  hour(s)).       Radiology Studies: No results found.      Scheduled Meds:  (feeding supplement) PROSource Plus  30 mL Oral BID BM   buPROPion  150 mg Oral Daily   busPIRone  5 mg Oral BID   [START ON 01/29/2021] cyanocobalamin  1,000 mcg Intramuscular Q30 days   enoxaparin (LOVENOX) injection  50 mg Subcutaneous Q12H   feeding supplement  237 mL Oral BID BM   folic acid  1 mg Oral Daily   furosemide  40 mg Intravenous BID   hydrocortisone  25 mg Rectal BID   lidocaine  1 patch Transdermal Daily   magic mouthwash w/lidocaine  5 mL Oral TID AC   metoprolol tartrate  25 mg Oral BID   multivitamin with minerals  1 tablet Oral Daily   pantoprazole  40 mg Oral BID   polyethylene glycol  17 g Oral Daily   potassium chloride  40 mEq Oral BID   pregabalin  75 mg Oral TID   Ensure Max Protein  11 oz Oral Daily   senna-docusate  1 tablet Oral BID   sodium chloride flush  3 mL Intravenous Q12H   Continuous Infusions:  sodium chloride 10 mL/hr at 01/07/21 1710     LOS: 44 days     Jacquelin Hawking, MD [facility] 01/23/2021, 1:35 PM  If 7PM-7AM, please contact night-coverage [email or web address]

## 2021-01-24 LAB — CBC
HCT: 32 % — ABNORMAL LOW (ref 36.0–46.0)
Hemoglobin: 9.6 g/dL — ABNORMAL LOW (ref 12.0–15.0)
MCH: 28.7 pg (ref 26.0–34.0)
MCHC: 30 g/dL (ref 30.0–36.0)
MCV: 95.5 fL (ref 80.0–100.0)
Platelets: 582 10*3/uL — ABNORMAL HIGH (ref 150–400)
RBC: 3.35 MIL/uL — ABNORMAL LOW (ref 3.87–5.11)
RDW: 19.7 % — ABNORMAL HIGH (ref 11.5–15.5)
WBC: 11.5 10*3/uL — ABNORMAL HIGH (ref 4.0–10.5)
nRBC: 0 % (ref 0.0–0.2)

## 2021-01-24 NOTE — Progress Notes (Signed)
PROGRESS NOTE    [name]  [id number] DOB: 04/11/1990 DOA: 12/09/2020 PCP: Patient, No Pcp Per (Inactive)   Brief Narrative: Carla Little is a 30 y.o. female with a history of morbid obesity, recent left knee dislocation. Patient presented secondary to weakness with presentation concerning for Guillain Barre syndrome. Neurology consulted and patient started on IVIG. She was also found to have vitamin B12 and folate deficiencies. Patient is now awaiting rehab options for discharge.   Assessment & Plan:   Principal Problem:   Intractable vomiting with nausea Active Problems:   Left knee dislocation   PUD (peptic ulcer disease)   Nonalcoholic steatohepatitis (NASH)   Class 3 obesity (HCC)   Sinus tachycardia   Hypokalemia   GERD (gastroesophageal reflux disease)   Iron deficiency anemia due to chronic blood loss   E. coli UTI   Guillain Barr syndrome ([place])   Guillain Barre syndrome Neuropathy Neurology consulted and recommended IVIG, for which patient completed 5 day course. Also recommended treating confounding diagnoses of vitamin B12 and folate deficiency.  -Continue to Lyrica 75 mg TID -Vitamin B12 injections q monthly  Intractable nausea and vomiting Initially resolved, recurrent. Associated generalized abdominal pain. In setting of known gastritis. GI consulted and patient underwent EGD on 10/18 without etiology. Improved. Recommendation for outpatient general surgery follow-up for cholelithiasis.  Rectal inflammation Seen on CT imaging. Rectal thickening extending into rectosigmoid junction. Patient does mention pain with having bowel movements/wiping but otherwise did not have symptoms. She reports history of no anal related sexual activity. No systemic symptoms concerning for infection. GI consulted and flexible sigmoidoscopy performed on 10/18 which was significant for poor prep and no obvious lesions except for hemorrhoids. GI signed off  10/19.  Depression/anxiety Evaluated by psychiatry -Continue Buspar 5 mg BID -Switched from Remeron to Wellbutrin XL  Shortness of breath Transient. Resolved.  Left knee dislocation Patient previously evaluated by orthopedic surgery with recommendation for hinge brace and to allow for joint scarring. Currently weight bearing as tolerated with limited flexion of left knee.  Sinus tachycardia This has been managed with metoprolol. Unsure of etiology. Possibly related to deconditioning. No concerning symptoms.  Iron deficiency anemia Related to heavy menstruation. Hemoglobin stable.  Elevated AST/ALT NAFLD Hyperbilirubinemia AST/ALT with initial trend upwards. No associated symptoms. Abdominal ultrasound (10/11) significant for fatty liver disease and cholelithiasis without cholecystitis. INR normal. Bilirubin stable and AST/ALT now stable but elevated. GI consulted.  B12 deficiency Folate deficiency B12 and folate of 153 and 3.2 respectively. Patient started on Vitamin 123456 and folic acid supplementation  E. Coli UTI Completed treatment with Ceftriaxone and transitioned to Cefazolin.  Overnight hypoxia Likely patient has OSA and/or OHS judging by symptoms and body habitus. Currently managed with oxygen overnight while asleep. CT abdomen/pelvis significant for evidence of hypoventilation. Attempted to trial CPAP but patient declined treatment.  Hypocalcemia Mild and likely related to hypoalbuminemia.  Hypokalemia -Potassium supplementation as needed  Anasarca Likely related to hypoalbuminemia. No evidence of kidney or heart impairment. Liver does have some disease; slightly elevated bilirubin. No ascites on ultrasound. Patient treated with Lasix with some improvement; unfortunately, there has been some contraction alkalosis -Continue Lasix for now; BMP daily while on Lasix. If recurring alkalosis, will discontinue. -Daily BMP  Epistaxis Possibly secondary to oxygen therapy.  Unsure that she needs oxygen continuously during the day. Hemoglobin stable. -Try to limit oxygen use  Bilateral leg pain Low suspicion for acute DVT, however pain is apparently new onset. Difficult to  assess from examination secondary to body habitus. Venous duplex was negative for acute DVT, bilaterally.  Possible oral candidiasis Treated with Diflucan IV. Seems to be resolved.  Positive blood culture result 1/4 samples significant for staphylococcus epidermidis. Unlikely active infection.  Morbid obesity Body mass index is 68.91 kg/m. Dietitian consulted. -Dietitian recommendations (10/12): Liberalize diet to regular to promote oral intake Will request food be chopped into bite sized pieces and dining services alert nursing staff when tray is delivered Nursing staff to assist with set-up and feeeding of meals Increase Ensure Enlive po to TID, each supplement provides 350 kcal and 20 grams of protein Continue MVI regimen   DVT prophylaxis: Lovenox Code Status:   Code Status: Full Code Family Communication: None at bedside Disposition Plan: Discharge to SNF when bed is available if remains stable.   Consultants:  Neurology Psychiatry Gastroenterology  Procedures:  None  Antimicrobials: Ceftriaxone Ancef Diflucan Flagyl    Subjective: No issues overnight  Objective: Vitals:   01/23/21 1330 01/23/21 2029 01/24/21 0233 01/24/21 0355  BP: 117/75 121/88  111/78  Pulse: 96 100  100  Resp: 20 20  20   Temp: 99.3 F (37.4 C) 98.3 F (36.8 C)  98.3 F (36.8 C)  TempSrc: Oral Oral  Axillary  SpO2: 99% 96%  97%  Weight:  (!) 182.2 kg (!) 182.2 kg   Height:        Intake/Output Summary (Last 24 hours) at 01/24/2021 1157 Last data filed at 01/24/2021 0446 Gross per 24 hour  Intake --  Output 500 ml  Net -500 ml    Filed Weights   01/23/21 0500 01/23/21 2029 01/24/21 0233  Weight: (!) 181.9 kg (!) 182.2 kg (!) 182.2 kg    Examination:  General exam:  Appears calm and comfortable   Data Reviewed: I have personally reviewed following labs and imaging studies  CBC Lab Results  Component Value Date   WBC 11.5 (H) 01/24/2021   RBC 3.35 (L) 01/24/2021   HGB 9.6 (L) 01/24/2021   HCT 32.0 (L) 01/24/2021   MCV 95.5 01/24/2021   MCH 28.7 01/24/2021   PLT 582 (H) 01/24/2021   MCHC 30.0 01/24/2021   RDW 19.7 (H) 01/24/2021   LYMPHSABS 2.0 01/03/2021   MONOABS 0.9 01/03/2021   EOSABS 0.0 01/03/2021   BASOSABS 0.0 01/03/2021     Last metabolic panel Lab Results  Component Value Date   NA 135 01/23/2021   K 3.8 01/23/2021   CL 92 (L) 01/23/2021   CO2 35 (H) 01/23/2021   BUN 15 01/23/2021   CREATININE <0.30 (L) 01/23/2021   GLUCOSE 104 (H) 01/23/2021   GFRNONAA NOT CALCULATED 01/23/2021   GFRAA >60 05/16/2018   CALCIUM 9.4 01/23/2021   PHOS 2.9 12/14/2020   PROT 9.1 (H) 01/16/2021   ALBUMIN 3.5 01/16/2021   BILITOT 1.6 (H) 01/16/2021   ALKPHOS 87 01/16/2021   AST 350 (H) 01/16/2021   ALT 123 (H) 01/16/2021   ANIONGAP 8 01/23/2021    CBG (last 3)  No results for input(s): GLUCAP in the last 72 hours.   GFR: CrCl cannot be calculated (This lab value cannot be used to calculate CrCl because it is not a number: <0.30).  Coagulation Profile: No results for input(s): INR, PROTIME in the last 168 hours.  No results found for this or any previous visit (from the past 240 hour(s)).       Radiology Studies: No results found.      Scheduled Meds:  (feeding  supplement) PROSource Plus  30 mL Oral BID BM   buPROPion  150 mg Oral Daily   busPIRone  5 mg Oral BID   [START ON 01/29/2021] cyanocobalamin  1,000 mcg Intramuscular Q30 days   enoxaparin (LOVENOX) injection  50 mg Subcutaneous Q12H   feeding supplement  237 mL Oral BID BM   folic acid  1 mg Oral Daily   furosemide  40 mg Intravenous BID   hydrocortisone  25 mg Rectal BID   lidocaine  1 patch Transdermal Daily   magic mouthwash w/lidocaine  5 mL Oral TID AC    metoprolol tartrate  25 mg Oral BID   multivitamin with minerals  1 tablet Oral Daily   pantoprazole  40 mg Oral BID   polyethylene glycol  17 g Oral Daily   potassium chloride  40 mEq Oral BID   pregabalin  75 mg Oral TID   Ensure Max Protein  11 oz Oral Daily   senna-docusate  1 tablet Oral BID   sodium chloride flush  3 mL Intravenous Q12H   Continuous Infusions:  sodium chloride 10 mL/hr at 01/07/21 1710     LOS: 45 days     Cordelia Poche, MD [facility] 01/24/2021, 11:57 AM  If 7PM-7AM, please contact night-coverage [email or web address]

## 2021-01-24 NOTE — Progress Notes (Signed)
Physical Therapy Treatment Patient Details Name: Carla Little MRN: [id number] DOB: 1991-01-09 Today's Date: 01/24/2021   History of Present Illness Patient is a 30 y.o. female who presented to [facility] for intractable N/V on 9/17. ED labwork reveals e. coli UTI. Pt had recent hospital admission from 8/8-9/5 due to Lt knee dislocation which was reduced with fractures of the proximal fibula ligamentous avulsion laterally as well as medially off the medial femoral condyle and MRI showed complete ACL & PCL tears and MCL strain. That hospital admission was complicated by nausea, vomiting, tachycardia, UTI, and fecal impaction;. PMH significant for morbid obesity, GERD, fatty liver disease, depression. Patient now with Guillian-Barre syndrome    PT Comments    Patient  with nursing being washed up. While on Left side, assisted with gravilty eliminated knee flexion and extension x 30. Patient demonstrates 2+/5 knee flex and ext on right. Assisted with AARO strength hip flexion- 2/5 gravity eliminated.   Placed  Crocks on feet. Placed blanket rolls under knees to prevent hyperextension(her joints are hyper moble      Bed straps in place, bed tilted  in stand/tilt position from 35* for 3 minutes, 45* for 7 mins, 48* for 9 mins. Patient able to perform knee extensions briefly  while in tilted standing. Noted ankles did not invert and legs remained in position through out.Patient tilted x 20 minutes total with  73-98 pounds WB per calculated by bed. Continue NMR, sidelying LE exercises , tilting and sitting. Patient to decide activity for next visit.  Patient also performed orange theraband exercises while intilted position.  BP 123/85, 110 HR on 2 L [place].      Recommendations for follow up therapy are one component of a multi-disciplinary discharge planning process, led by the attending physician.  Recommendations may be updated based on patient status, additional functional criteria and insurance  authorization.  Follow Up Recommendations  Skilled nursing-short term rehab (<3 hours/day)     Assistance Recommended at Discharge Frequent or constant Supervision/Assistance  Equipment Recommendations  Wheelchair (measurements PT);Wheelchair cushion (measurements PT)    Recommendations for Other Services       Precautions / Restrictions Precautions Precautions: Fall Precaution Comments: LE's remain very weak, Other Brace: does not need KI at this time but may consider since opatient has more weakness due GBS     Mobility  Bed Mobility   Bed Mobility: Rolling Rolling: Max assist;+2 for physical assistance;+2 for safety/equipment         General bed mobility comments: patient initiating reaching for rail and assists with pulling  on rail to roll to each side. Start Time: 1159 Angle:  (35,  42 then 48) Total Minutes in Angle: 20 minutes Patient Response: Cooperative  Transfers                        Ambulation/Gait                 Stairs             Wheelchair Mobility    Modified Rankin (Stroke Patients Only)       Balance                                            Cognition   Behavior During Therapy: Flat affect;WFL for tasks assessed/performed  General Comments: intermittent cheerful and flat        Exercises Other Exercises Other Exercises: in left sidelying, AAROM Knee flexion and extension x 30, Hip f;exion x 10. Other Exercises: trunk rotation x 5 to each side and forward stretch, reaching UE's forward toward feet. maintained x 1 minute intervals x 5.    General Comments        Pertinent Vitals/Pain Faces Pain Scale: Hurts even more Pain Location: knees Pain Descriptors / Indicators: Discomfort Pain Intervention(s): Monitored during session;Premedicated before session;Repositioned    Home Living                          Prior Function             PT Goals (current goals can now be found in the care plan section) Progress towards PT goals: Progressing toward goals    Frequency    Min 2X/week      PT Plan Current plan remains appropriate    Co-evaluation              AM-PAC PT "6 Clicks" Mobility   Outcome Measure  Help needed turning from your back to your side while in a flat bed without using bedrails?: A Lot Help needed moving from lying on your back to sitting on the side of a flat bed without using bedrails?: A Lot Help needed moving to and from a bed to a chair (including a wheelchair)?: Total Help needed standing up from a chair using your arms (e.g., wheelchair or bedside chair)?: Total Help needed to walk in hospital room?: Total Help needed climbing 3-5 steps with a railing? : Total 6 Click Score: 8    End of Session Equipment Utilized During Treatment: Oxygen Activity Tolerance: Patient tolerated treatment well Patient left: in bed;with call bell/phone within reach Nurse Communication: Mobility status;Need for lift equipment PT Visit Diagnosis: Muscle weakness (generalized) (M62.81);Other symptoms and signs involving the nervous system (R29.898) Pain - Right/Left: Right Pain - part of body: Knee     Time: 1132-1230 PT Time Calculation (min) (ACUTE ONLY): 58 min  Charges:  $Therapeutic Exercise: 8-22 mins $Therapeutic Activity: 23-37 mins $Neuromuscular Re-education: 8-22 mins                     Blanchard Kelch PT Acute Rehabilitation Services Pager [phone] Office [phone]    Rada Hay 01/24/2021, 1:53 PM

## 2021-01-25 LAB — BASIC METABOLIC PANEL
Anion gap: 9 (ref 5–15)
BUN: 13 mg/dL (ref 6–20)
CO2: 32 mmol/L (ref 22–32)
Calcium: 9.3 mg/dL (ref 8.9–10.3)
Chloride: 93 mmol/L — ABNORMAL LOW (ref 98–111)
Creatinine, Ser: 0.31 mg/dL — ABNORMAL LOW (ref 0.44–1.00)
GFR, Estimated: >60 mL/min (ref >60)
Glucose, Bld: 130 mg/dL — ABNORMAL HIGH (ref 70–99)
Potassium: 3.4 mmol/L — ABNORMAL LOW (ref 3.5–5.1)
Sodium: 134 mmol/L — ABNORMAL LOW (ref 135–145)

## 2021-01-25 LAB — CBC
HCT: 31.9 % — ABNORMAL LOW (ref 36.0–46.0)
Hemoglobin: 9.4 g/dL — ABNORMAL LOW (ref 12.0–15.0)
MCH: 28.1 pg (ref 26.0–34.0)
MCHC: 29.5 g/dL — ABNORMAL LOW (ref 30.0–36.0)
MCV: 95.5 fL (ref 80.0–100.0)
Platelets: 587 10*3/uL — ABNORMAL HIGH (ref 150–400)
RBC: 3.34 MIL/uL — ABNORMAL LOW (ref 3.87–5.11)
RDW: 19.6 % — ABNORMAL HIGH (ref 11.5–15.5)
WBC: 12.6 10*3/uL — ABNORMAL HIGH (ref 4.0–10.5)
nRBC: 0 % (ref 0.0–0.2)

## 2021-01-25 MED ORDER — PROSOURCE PLUS PO LIQD
30.0000 mL | Freq: Three times a day (TID) | ORAL | Status: DC
  Administered 2021-01-25 – 2021-01-26 (×5): 30 mL via ORAL
  Filled 2021-01-25 (×5): qty 30

## 2021-01-25 NOTE — Progress Notes (Signed)
PROGRESS NOTE    Carla Little  ZOX:[id number] DOB: 07/23/1990 DOA: 12/09/2020 PCP: Patient, No Pcp Per (Inactive)   Brief Narrative: Carla Little is a 30 y.o. female with a history of morbid obesity, recent left knee dislocation. Patient presented secondary to weakness with presentation concerning for Guillain Barre syndrome. Neurology consulted and patient started on IVIG. She was also found to have vitamin B12 and folate deficiencies. Patient is now awaiting rehab options for discharge.   Assessment & Plan:   Principal Problem:   Intractable vomiting with nausea Active Problems:   Left knee dislocation   PUD (peptic ulcer disease)   Nonalcoholic steatohepatitis (NASH)   Class 3 obesity (HCC)   Sinus tachycardia   Hypokalemia   GERD (gastroesophageal reflux disease)   Iron deficiency anemia due to chronic blood loss   E. coli UTI   Guillain Barr syndrome (HCC)   Guillain Barre syndrome Neuropathy Neurology consulted and recommended IVIG, for which patient completed 5 day course. Also recommended treating confounding diagnoses of vitamin B12 and folate deficiency.  -Continue to Lyrica 75 mg TID -Vitamin B12 injections q monthly  Intractable nausea and vomiting Initially resolved, recurrent. Associated generalized abdominal pain. In setting of known gastritis. GI consulted and patient underwent EGD on 10/18 without etiology. Improved. Recommendation for outpatient general surgery follow-up for cholelithiasis.  Rectal inflammation Seen on CT imaging. Rectal thickening extending into rectosigmoid junction. Patient does mention pain with having bowel movements/wiping but otherwise did not have symptoms. She reports history of no anal related sexual activity. No systemic symptoms concerning for infection. GI consulted and flexible sigmoidoscopy performed on 10/18 which was significant for poor prep and no obvious lesions except for hemorrhoids. GI signed off  10/19.  Depression/anxiety Evaluated by psychiatry -Continue Buspar 5 mg BID -Switched from Remeron to Wellbutrin XL  Shortness of breath Transient. Resolved.  Left knee dislocation Patient previously evaluated by orthopedic surgery with recommendation for hinge brace and to allow for joint scarring. Currently weight bearing as tolerated with limited flexion of left knee.  Sinus tachycardia This has been managed with metoprolol. Unsure of etiology. Possibly related to deconditioning. No concerning symptoms.  Iron deficiency anemia Related to heavy menstruation. Hemoglobin stable.  Elevated AST/ALT NAFLD Hyperbilirubinemia AST/ALT with initial trend upwards. No associated symptoms. Abdominal ultrasound (10/11) significant for fatty liver disease and cholelithiasis without cholecystitis. INR normal. Bilirubin stable and AST/ALT now stable but elevated. GI consulted.  B12 deficiency Folate deficiency B12 and folate of 153 and 3.2 respectively. Patient started on Vitamin B12 and folic acid supplementation  E. Coli UTI Completed treatment with Ceftriaxone and transitioned to Cefazolin.  Overnight hypoxia Likely patient has OSA and/or OHS judging by symptoms and body habitus. Currently managed with oxygen overnight while asleep. CT abdomen/pelvis significant for evidence of hypoventilation. Attempted to trial CPAP but patient declined treatment.  Hypocalcemia Mild and likely related to hypoalbuminemia.  Hypokalemia -Potassium supplementation as needed  Anasarca Likely related to hypoalbuminemia. No evidence of kidney or heart impairment. Liver does have some disease; slightly elevated bilirubin. No ascites on ultrasound. Patient treated with Lasix with some improvement; unfortunately, there has been some contraction alkalosis -Continue Lasix for now; BMP daily while on Lasix. If recurring alkalosis, will discontinue. -Daily BMP; pending today  Epistaxis Possibly secondary to  oxygen therapy. Unsure that she needs oxygen continuously during the day. Hemoglobin stable. -Try to limit oxygen use  Bilateral leg pain Low suspicion for acute DVT, however pain is apparently new onset.  Difficult to assess from examination secondary to body habitus. Venous duplex was negative for acute DVT, bilaterally.  Possible oral candidiasis Treated with Diflucan IV. Seems to be resolved.  Positive blood culture result 1/4 samples significant for staphylococcus epidermidis. Unlikely active infection.  Morbid obesity Body mass index is 69.29 kg/m. Dietitian consulted. -Dietitian recommendations (10/12): Liberalize diet to regular to promote oral intake Will request food be chopped into bite sized pieces and dining services alert nursing staff when tray is delivered Nursing staff to assist with set-up and feeeding of meals Increase Ensure Enlive po to TID, each supplement provides 350 kcal and 20 grams of protein Continue MVI regimen   DVT prophylaxis: Lovenox Code Status:   Code Status: Full Code Family Communication: None at bedside Disposition Plan: Discharge to SNF when bed is available if remains stable.   Consultants:  Neurology Psychiatry Gastroenterology  Procedures:  None  Antimicrobials: Ceftriaxone Ancef Diflucan Flagyl    Subjective: No issues noted overnight  Objective: Vitals:   01/24/21 1348 01/24/21 1952 01/25/21 0230 01/25/21 0339  BP: 128/85 116/63  114/62  Pulse: (!) 110 (!) 101  100  Resp:  20  20  Temp:  97.7 F (36.5 C)  99.3 F (37.4 C)  TempSrc:  Oral  Oral  SpO2:  98%  95%  Weight:  (!) 183.2 kg (!) 183.2 kg   Height:        Intake/Output Summary (Last 24 hours) at 01/25/2021 0953 Last data filed at 01/25/2021 0339 Gross per 24 hour  Intake --  Output 550 ml  Net -550 ml    Filed Weights   01/24/21 0233 01/24/21 1952 01/25/21 0230  Weight: (!) 182.2 kg (!) 183.2 kg (!) 183.2 kg    Examination:  General: Well  appearing, no distress   Data Reviewed: I have personally reviewed following labs and imaging studies  CBC Lab Results  Component Value Date   WBC 12.6 (H) 01/25/2021   RBC 3.34 (L) 01/25/2021   HGB 9.4 (L) 01/25/2021   HCT 31.9 (L) 01/25/2021   MCV 95.5 01/25/2021   MCH 28.1 01/25/2021   PLT 587 (H) 01/25/2021   MCHC 29.5 (L) 01/25/2021   RDW 19.6 (H) 01/25/2021   LYMPHSABS 2.0 01/03/2021   MONOABS 0.9 01/03/2021   EOSABS 0.0 01/03/2021   BASOSABS 0.0 99991111     Last metabolic panel Lab Results  Component Value Date   NA 135 01/23/2021   K 3.8 01/23/2021   CL 92 (L) 01/23/2021   CO2 35 (H) 01/23/2021   BUN 15 01/23/2021   CREATININE <0.30 (L) 01/23/2021   GLUCOSE 104 (H) 01/23/2021   GFRNONAA NOT CALCULATED 01/23/2021   GFRAA >60 05/16/2018   CALCIUM 9.4 01/23/2021   PHOS 2.9 12/14/2020   PROT 9.1 (H) 01/16/2021   ALBUMIN 3.5 01/16/2021   BILITOT 1.6 (H) 01/16/2021   ALKPHOS 87 01/16/2021   AST 350 (H) 01/16/2021   ALT 123 (H) 01/16/2021   ANIONGAP 8 01/23/2021    CBG (last 3)  No results for input(s): GLUCAP in the last 72 hours.   GFR: CrCl cannot be calculated (This lab value cannot be used to calculate CrCl because it is not a number: <0.30).  Coagulation Profile: No results for input(s): INR, PROTIME in the last 168 hours.  No results found for this or any previous visit (from the past 240 hour(s)).       Radiology Studies: No results found.      Scheduled Meds:  (  feeding supplement) PROSource Plus  30 mL Oral BID BM   buPROPion  150 mg Oral Daily   busPIRone  5 mg Oral BID   [START ON 01/29/2021] cyanocobalamin  1,000 mcg Intramuscular Q30 days   enoxaparin (LOVENOX) injection  50 mg Subcutaneous Q12H   feeding supplement  237 mL Oral BID BM   folic acid  1 mg Oral Daily   furosemide  40 mg Intravenous BID   hydrocortisone  25 mg Rectal BID   lidocaine  1 patch Transdermal Daily   magic mouthwash w/lidocaine  5 mL Oral TID AC    metoprolol tartrate  25 mg Oral BID   multivitamin with minerals  1 tablet Oral Daily   pantoprazole  40 mg Oral BID   polyethylene glycol  17 g Oral Daily   potassium chloride  40 mEq Oral BID   pregabalin  75 mg Oral TID   Ensure Max Protein  11 oz Oral Daily   senna-docusate  1 tablet Oral BID   sodium chloride flush  3 mL Intravenous Q12H   Continuous Infusions:  sodium chloride 10 mL/hr at 01/07/21 1710     LOS: 46 days     Cordelia Poche, MD [facility] 01/25/2021, 9:53 AM  If 7PM-7AM, please contact night-coverage [email or web address]

## 2021-01-25 NOTE — Progress Notes (Signed)
Occupational Therapy Treatment Patient Details Name: Carla Little MRN: [id number] DOB: 1990-04-21 Today's Date: 01/25/2021   History of present illness Patient is a 30 y.o. female who presented to [facility] for intractable N/V on 9/17. ED labwork reveals e. coli UTI. Pt had recent hospital admission from 8/8-9/5 due to Lt knee dislocation which was reduced with fractures of the proximal fibula ligamentous avulsion laterally as well as medially off the medial femoral condyle and MRI showed complete ACL & PCL tears and MCL strain. That hospital admission was complicated by nausea, vomiting, tachycardia, UTI, and fecal impaction;. PMH significant for morbid obesity, GERD, fatty liver disease, depression. Patient now with Guillian-Barre syndrome   OT comments  Treatment focused on use of upper extremities for fine and gross motor activities and overall strengthening via use. Patient continues to be limited by sensory impairments. Right thumb ROM decreased compared to left and provided with stretch. Encouraged patient to stretch thumb and perform ball squeezes while in room at rest.   Recommendations for follow up therapy are one component of a multi-disciplinary discharge planning process, led by the attending physician.  Recommendations may be updated based on patient status, additional functional criteria and insurance authorization.    Follow Up Recommendations  Skilled nursing-short term rehab (<3 hours/day)    Assistance Recommended at Discharge    Equipment Recommendations  None recommended by OT    Recommendations for Other Services      Precautions / Restrictions Precautions Precautions: Fall Precaution Comments: LE's remain very weak, Required Braces or Orthoses: Knee Immobilizer - Left Other Brace: does not need KI at this time but may consider since opatient has more weakness due GBS Restrictions Weight Bearing Restrictions: No LLE Weight Bearing: Weight bearing as  tolerated Other Position/Activity Restrictions: PT Orders from Maureen Ralphs, MD; "WBAT Left LE, only up with a walker, does not need brace at this standpoint"       Mobility Bed Mobility                    Transfers                         Balance                                           ADL either performed or assessed with clinical judgement   ADL                                               Vision Patient Visual Report: No change from baseline     Perception     Praxis      Cognition Arousal/Alertness: Awake/alert Behavior During Therapy: WFL for tasks assessed/performed Overall Cognitive Status: Within Functional Limits for tasks assessed                                            Exercises Other Exercises Other Exercises: patient participated in [facility] challanges with theraputty to encourage [facility] using pincer pinch - grasping putty balls with left hand and right hand using index finger and thumb only. On right hand  grasped with thumb and middle finger. Ball squeezes x 20 each hand. Gross motor coordination task with each hand to push bubbles on bubble fidget toy with patient having to reach, sustain arm position and push bubbles.   Shoulder Instructions       General Comments      Pertinent Vitals/ Pain       Pain Assessment: No/denies pain  Home Living                                          Prior Functioning/Environment              Frequency  Min 2X/week        Progress Toward Goals  OT Goals(current goals can now be found in the care plan section)  Progress towards OT goals: Progressing toward goals  Acute Rehab OT Goals OT Goal Formulation: With patient Time For Goal Achievement: 02/04/21 Potential to Achieve Goals: Fair  Plan Discharge plan remains appropriate    Co-evaluation                 AM-PAC OT "6 Clicks" Daily Activity      Outcome Measure   Help from another person eating meals?: A Little Help from another person taking care of personal grooming?: A Little Help from another person toileting, which includes using toliet, bedpan, or urinal?: Total Help from another person bathing (including washing, rinsing, drying)?: A Lot Help from another person to put on and taking off regular upper body clothing?: Total Help from another person to put on and taking off regular lower body clothing?: Total 6 Click Score: 11    End of Session    OT Visit Diagnosis: Other abnormalities of gait and mobility (R26.89);History of falling (Z91.81);Pain;Muscle weakness (generalized) (M62.81)   Activity Tolerance Patient tolerated treatment well   Patient Left in bed;with call bell/phone within reach   Nurse Communication Mobility status        Time: 1287-8676 OT Time Calculation (min): 23 min  Charges: OT General Charges $OT Visit: 1 Visit OT Treatments $Neuromuscular Re-education: 23-37 mins  Shanon Becvar, OTR/L Acute Care Rehab Services  Office [phone] Pager: [phone]   Kelli Churn 01/25/2021, 4:13 PM

## 2021-01-25 NOTE — Progress Notes (Signed)
Nutrition Follow-up  DOCUMENTATION CODES:   Morbid obesity  INTERVENTION:   -Prosource Plus PO TID, each provides 100 kcals and 15g protein  -Ensure Enlive po BID, each supplement provides 350 kcal and 20 grams of protein  -Multivitamin with minerals daily  -D/c Ensure Max -pt refusing  NUTRITION DIAGNOSIS:   Inadequate oral intake related to poor appetite (difficulty with self-feeding) as evidenced by meal completion < 25%, per patient/family report.  Ongoing.  GOAL:   Patient will meet greater than or equal to 90% of their needs  Meeting 45% with supplements  MONITOR:   PO intake, Supplement acceptance, Weight trends  ASSESSMENT:   30 y.o. female with history of class III obesity, depression, GERD, nonalcoholic steatohepatitis, and PUD presented to ED 9/16 after being dc 9/5 after a month long admission with complaints of worsening nausea and vomiting at home.  10/18: s/p EGD, flex sigmoidoscopy  Patient now on regular diet consistency. Drinking primarily Ensure BID and Prosource Plus BID (total of 900 kcals, 70g protein). Does not like Ensure Max and is refusing this supplement so will d/c.  Admission weight:399 lbs Current weight: 403 lbs   I/Os: -14.7L since 10/19  Medications: Folic acid, Lasix, Magic mouthwash w/ lidocaine, Multivitamin with minerals daily, KLOR-CON, Senokot  Labs reviewed: Low Na, K  Diet Order:   Diet Order             Diet regular Room service appropriate? Yes; Fluid consistency: Thin  Diet effective now                   EDUCATION NEEDS:   Education needs have been addressed  Skin:  Skin Assessment: Reviewed RN Assessment  Last BM:  11/1 -type 5&6  Height:   Ht Readings from Last 1 Encounters:  01/13/21 5' 4.02" (1.626 m)    Weight:   Wt Readings from Last 1 Encounters:  01/25/21 (!) 183.2 kg    Ideal Body Weight:  54.5 kg  BMI:  Body mass index is 69.29 kg/m.  Estimated Nutritional Needs:   Kcal:   2000-2200 kcal/d  Protein:  100-110 g/d  Fluid:  1.8-2 L/d  Tilda Franco, MS, RD, LDN Inpatient Clinical Dietitian Contact information available via Amion

## 2021-01-25 NOTE — Progress Notes (Signed)
Physical Therapy Treatment Patient Details Name: Carla Little MRN: [id number] DOB: [birth year] Today's Date: 01/25/2021   History of Present Illness Patient is a 30 y.o. female who presented to [facility] for intractable N/V on 9/17. ED labwork reveals e. coli UTI. Pt had recent hospital admission from 8/8-9/5 due to Lt knee dislocation which was reduced with fractures of the proximal fibula ligamentous avulsion laterally as well as medially off the medial femoral condyle and MRI showed complete ACL & PCL tears and MCL strain. That hospital admission was complicated by nausea, vomiting, tachycardia, UTI, and fecal impaction;. PMH significant for morbid obesity, GERD, fatty liver disease, depression. Patient now with Guillian-Barre syndrome    PT Comments    Patient  worked with therapy x 60" performing NMR exercises, sitting in upright bed position then on side of bed with feet supported. Patient sat upright x total of 40 minutes,patient  performed pulling to upright position x 5, UE exercises and trunk rottions. Continue PT.   Recommendations for follow up therapy are one component of a multi-disciplinary discharge planning process, led by the attending physician.  Recommendations may be updated based on patient status, additional functional criteria and insurance authorization.  Follow Up Recommendations  Skilled nursing-short term rehab (<3 hours/day)     Assistance Recommended at Discharge Frequent or constant Supervision/Assistance  Equipment Recommendations  Wheelchair (measurements PT);Wheelchair cushion (measurements PT)    Recommendations for Other Services       Precautions / Restrictions Precautions Precautions: Fall Precaution Comments: LE's remain very weak, Required Braces or Orthoses: Knee Immobilizer - Left Other Brace: does not need KI at this time but may consider since opatient has more weakness due GBS Restrictions Weight Bearing Restrictions: No LLE Weight Bearing:  Weight bearing as tolerated Other Position/Activity Restrictions: PT Orders from Maureen Ralphs, MD; "WBAT Left LE, only up with a walker, does not need brace at this standpoint"     Mobility  Bed Mobility   Bed Mobility: Rolling Rolling: Max assist;+2 for physical assistance;+2 for safety/equipment   Supine to sit: HOB elevated;+2 for physical assistance;Min assist     General bed mobility comments: patient initiating reaching for rail and assists with pulling  on rail to roll to each side. In bed chair position, patient  able to pull self forward with min assistance away from bed. performed x 5. with 3 assist, turned patient around to sit on bed edge x 15 minutes, feet supported on recliner.    Transfers                        Ambulation/Gait                 Stairs             Wheelchair Mobility    Modified Rankin (Stroke Patients Only)       Balance       Sitting balance - Comments: worked on balance in bed chair  upright position   ans seated on bed edge x 35 minutes  total, no rest breaks                                    Cognition Arousal/Alertness: Awake/alert Behavior During Therapy: WFL for tasks assessed/performed Overall Cognitive Status: Within Functional Limits for tasks assessed  Exercises General Exercises - Lower Extremity Long Arc Quad: AROM;Both;20 reps;Seated Other Exercises Other Exercises: patient participated in [facility] challanges with theraputty to encourage [facility] using pincer pinch - grasping putty balls with left hand and right hand using index finger and thumb only. On right hand grasped with thumb and middle finger. Ball squeezes x 20 each hand. Gross motor coordination task with each hand to push bubbles on bubble fidget toy with patient having to reach, sustain arm position and push bubbles.    General Comments        Pertinent Vitals/Pain Pain  Assessment: No/denies pain Faces Pain Scale: Hurts even more Pain Location: knees Pain Descriptors / Indicators: Discomfort Pain Intervention(s): Premedicated before session;Repositioned    Home Living                          Prior Function            PT Goals (current goals can now be found in the care plan section) Acute Rehab PT Goals Patient Stated Goal: get better Progress towards PT goals: Progressing toward goals    Frequency    Min 2X/week      PT Plan Current plan remains appropriate    Co-evaluation              AM-PAC PT "6 Clicks" Mobility   Outcome Measure  Help needed turning from your back to your side while in a flat bed without using bedrails?: A Lot Help needed moving from lying on your back to sitting on the side of a flat bed without using bedrails?: A Lot Help needed moving to and from a bed to a chair (including a wheelchair)?: Total Help needed standing up from a chair using your arms (e.g., wheelchair or bedside chair)?: Total Help needed to walk in hospital room?: Total Help needed climbing 3-5 steps with a railing? : Total 6 Click Score: 8    End of Session   Activity Tolerance: Patient tolerated treatment well Patient left: in bed;with call bell/phone within reach Nurse Communication: Mobility status;Need for lift equipment PT Visit Diagnosis: Muscle weakness (generalized) (M62.81);Other symptoms and signs involving the nervous system (R29.898) Pain - Right/Left: Right Pain - part of body: Knee     Time: 1107-1230 PT Time Calculation (min) (ACUTE ONLY): 83 min  Charges:  $Therapeutic Activity: 23-37 mins $Neuromuscular Re-education: 23-37 mins $Self Care/Home Management: 8-22                     ,Tresa Endo PT Acute Rehabilitation Services Pager [phone] Office [phone]    Claretha Cooper 01/25/2021, 5:20 PM

## 2021-01-25 NOTE — Progress Notes (Signed)
   01/25/21 1200  Mobility  Range of Motion/Exercises Active;Right arm;Left arm  $Mobility charge 1 Mobility   Upon entering the room, pt stated PT would be in shortly. While waiting for PT, aided pt in brushing her teeth. Pt did very well with this task today. She did note that her legs and hands were a bit painful today, but believes this is secondary to working hard during her PT session yesterday. Once finished, PT was present in room and was ready to begin session.   Timoteo [employer] Mobility Specialist Acute Rehab Services Office: [phone]

## 2021-01-26 ENCOUNTER — Encounter (HOSPITAL_COMMUNITY): Payer: Self-pay | Admitting: Internal Medicine

## 2021-01-26 LAB — BASIC METABOLIC PANEL
Anion gap: 10 (ref 5–15)
BUN: 13 mg/dL (ref 6–20)
CO2: 32 mmol/L (ref 22–32)
Calcium: 9.3 mg/dL (ref 8.9–10.3)
Chloride: 91 mmol/L — ABNORMAL LOW (ref 98–111)
Creatinine, Ser: 0.37 mg/dL — ABNORMAL LOW (ref 0.44–1.00)
GFR, Estimated: >60 mL/min (ref >60)
Glucose, Bld: 131 mg/dL — ABNORMAL HIGH (ref 70–99)
Potassium: 3.4 mmol/L — ABNORMAL LOW (ref 3.5–5.1)
Sodium: 133 mmol/L — ABNORMAL LOW (ref 135–145)

## 2021-01-26 LAB — RESP PANEL BY RT-PCR (FLU A&B, COVID) ARPGX2
Influenza A by PCR: NEGATIVE
Influenza B by PCR: NEGATIVE
SARS Coronavirus 2 by RT PCR: NEGATIVE

## 2021-01-26 LAB — MAGNESIUM: Magnesium: 2 mg/dL (ref 1.7–2.4)

## 2021-01-26 MED ORDER — PANTOPRAZOLE SODIUM 40 MG PO TBEC: 40.0000 mg | DELAYED_RELEASE_TABLET | Freq: Two times a day (BID) | ORAL | Status: DC

## 2021-01-26 MED ORDER — BUPROPION HCL ER (XL) 150 MG PO TB24: 150.0000 mg | ORAL_TABLET | Freq: Every day | ORAL | Status: DC

## 2021-01-26 MED ORDER — BUSPIRONE HCL 5 MG PO TABS: 5.0000 mg | ORAL_TABLET | Freq: Two times a day (BID) | ORAL | Status: DC

## 2021-01-26 MED ORDER — POLYETHYLENE GLYCOL 3350 17 G PO PACK: 17.0000 g | PACK | Freq: Every day | ORAL | Status: DC

## 2021-01-26 MED ORDER — METOPROLOL TARTRATE 25 MG PO TABS: 25.0000 mg | ORAL_TABLET | Freq: Two times a day (BID) | ORAL | 1 refills | Status: DC

## 2021-01-26 MED ORDER — OXYCODONE HCL 5 MG/5ML PO SOLN: 5.0000 mg | Freq: Three times a day (TID) | ORAL | 0 refills | Status: AC | PRN

## 2021-01-26 MED ORDER — SENNOSIDES-DOCUSATE SODIUM 8.6-50 MG PO TABS: 1.0000 | ORAL_TABLET | Freq: Every evening | ORAL | Status: DC | PRN

## 2021-01-26 MED ORDER — PREGABALIN 75 MG PO CAPS: 75.0000 mg | ORAL_CAPSULE | Freq: Three times a day (TID) | ORAL | 0 refills | Status: DC

## 2021-01-26 MED ORDER — ACETAMINOPHEN 325 MG PO TABS: 650.0000 mg | ORAL_TABLET | Freq: Four times a day (QID) | ORAL | Status: AC | PRN

## 2021-01-26 MED ORDER — FOLIC ACID 1 MG PO TABS
1.0000 mg | ORAL_TABLET | Freq: Every day | ORAL | Status: AC
Start: 2021-01-27 — End: ?

## 2021-01-26 MED ORDER — CYANOCOBALAMIN 1000 MCG/ML IJ SOLN: 1000.0000 ug | INTRAMUSCULAR | 0 refills | Status: AC

## 2021-01-26 MED ORDER — LIDOCAINE 5 % EX PTCH: 1.0000 | MEDICATED_PATCH | Freq: Every day | CUTANEOUS | Status: DC

## 2021-01-26 NOTE — Progress Notes (Signed)
Physical Therapy Treatment Patient Details Name: Carla Little MRN: [id number] DOB: Oct 31, 1990 Today's Date: 01/26/2021   History of Present Illness Patient is a 30 y.o. female who presented to [facility] for intractable N/V on 9/17. ED labwork reveals e. coli UTI. Pt had recent hospital admission from 8/8-9/5 due to Lt knee dislocation which was reduced with fractures of the proximal fibula ligamentous avulsion laterally as well as medially off the medial femoral condyle and MRI showed complete ACL & PCL tears and MCL strain. That hospital admission was complicated by nausea, vomiting, tachycardia, UTI, and fecal impaction;. PMH significant for morbid obesity, GERD, fatty liver disease, depression. Patient now with Guillian-Barre syndrome    PT Comments    Patient chose to work on tilt bed standing and sitting upright. Patient's  shoes placed, legs positioned as best to tolerate WB. Straps placed and bed tilted to 40*. Patient tolerated  x 10 minutes. Patient wporked on knee extension. Patient reports increased back pain so time was shortened and returned to supine.  Patient  then assisted to sitting upright in bed, legs dependent at ~ 30 *. Patient  worked on trunk control, rotations and sat unassisted x 30".  PT to continue on  tilting/standing, bed mobility and sitting balance, MNR exercises.  Recommendations for follow up therapy are one component of a multi-disciplinary discharge planning process, led by the attending physician.  Recommendations may be updated based on patient status, additional functional criteria and insurance authorization.  Follow Up Recommendations  Skilled nursing-short term rehab (<3 hours/day)     Assistance Recommended at Discharge Frequent or constant Supervision/Assistance  Equipment Recommendations  Wheelchair (measurements PT);Wheelchair cushion (measurements PT)    Recommendations for Other Services       Precautions / Restrictions  Precautions Precautions: Fall Precaution Comments: LE's remain very weak, maxisky OOB Other Brace: does not need KI at this time but may consider since opatient has more weakness due GBS     Mobility  Bed Mobility   Bed Mobility: Rolling Rolling: Max assist;+2 for physical assistance;+2 for safety/equipment   Supine to sit: HOB elevated;+2 for physical assistance;Min assist Sit to supine: Supervision   General bed mobility comments: After tilting  to stand x 10 minutes, Hob raised to 50*. Assisted patient  to lean forward, more difficulty due to reports of back pain. Start Time: 1137 Angle: 40 degrees Total Minutes in Angle: 10 minutes  Transfers                        Ambulation/Gait                 Stairs             Wheelchair Mobility    Modified Rankin (Stroke Patients Only)       Balance   Sitting-balance support: No upper extremity supported Sitting balance-Leahy Scale: Fair Sitting balance - Comments: once sitting upright, patient sat balanced and weight shifting to each side and leaning forward to reach feet. patient sat with no back support  x 30 minutes, performed UE exercises.                                    Cognition Arousal/Alertness: Awake/alert Behavior During Therapy: WFL for tasks assessed/performed;Flat affect Overall Cognitive Status: Within Functional Limits for tasks assessed  General Comments: intermittent cheerful and flat, expresses concern for going to a facility        Exercises      General Comments        Pertinent Vitals/Pain Faces Pain Scale: Hurts even more Pain Location: skin to touch all over and back when intilt stand position at 45* Pain Descriptors / Indicators: Pins and needles;Tingling;Aching;Spasm Pain Intervention(s): Monitored during session;Premedicated before session    Home Living                          Prior  Function            PT Goals (current goals can now be found in the care plan section) Acute Rehab PT Goals Potential to Achieve Goals: Fair Progress towards PT goals: Progressing toward goals    Frequency    Min 2X/week      PT Plan Current plan remains appropriate    Co-evaluation              AM-PAC PT "6 Clicks" Mobility   Outcome Measure  Help needed turning from your back to your side while in a flat bed without using bedrails?: A Lot Help needed moving from lying on your back to sitting on the side of a flat bed without using bedrails?: A Lot Help needed moving to and from a bed to a chair (including a wheelchair)?: Total Help needed standing up from a chair using your arms (e.g., wheelchair or bedside chair)?: Total Help needed to walk in hospital room?: Total Help needed climbing 3-5 steps with a railing? : Total 6 Click Score: 8    End of Session Equipment Utilized During Treatment: Oxygen Activity Tolerance: Patient tolerated treatment well Patient left: in bed;with call bell/phone within reach Nurse Communication: Mobility status;Need for lift equipment PT Visit Diagnosis: Muscle weakness (generalized) (M62.81);Other symptoms and signs involving the nervous system (R29.898) Pain - Right/Left: Right Pain - part of body: Knee     Time: 1610-9604 PT Time Calculation (min) (ACUTE ONLY): 75 min  Charges:  $Therapeutic Exercise: 8-22 mins $Therapeutic Activity: 23-37 mins $Neuromuscular Re-education: 23-37 mins                   Blanchard Kelch PT Acute Rehabilitation Services Pager [phone] Office [phone]    Rada Hay 01/26/2021, 1:55 PM

## 2021-01-26 NOTE — Progress Notes (Signed)
Report given to [facility] at facility.

## 2021-01-26 NOTE — Discharge Summary (Signed)
Physician Discharge Summary  Carla Little:[id number] DOB: 11-14-1990 DOA: 12/09/2020  PCP: Patient, No Pcp Per (Inactive)  Admit date: 12/09/2020 Discharge date: 01/26/2021  Discharge disposition: SNF   Recommendations for Outpatient Follow-Up:   Follow-up with physician at the nursing home within 3 days of discharge Follow-up with pulmonologist as an outpatient for sleep study  Discharge Diagnosis:   Principal Problem:   Intractable vomiting with nausea Active Problems:   Left knee dislocation   PUD (peptic ulcer disease)   Nonalcoholic steatohepatitis (NASH)   Class 3 obesity (HCC)   Sinus tachycardia   Hypokalemia   GERD (gastroesophageal reflux disease)   Iron deficiency anemia due to chronic blood loss   E. coli UTI   Guillain Barr syndrome (HCC)    Discharge Condition: Stable.  Diet recommendation:  Diet Order             Diet - low sodium heart healthy           Diet regular Room service appropriate? Yes; Fluid consistency: Thin  Diet effective now                     Code Status: Full Code     Hospital Course:   [name] is a 30 year old woman with medical history significant for morbid obesity, recent left knee dislocation s/p mechanical fall (hospitalized for a month), depression, GERD, nonalcoholic steatohepatitis, gastric ulcer, who presented to the hospital with generalized weakness, nausea and vomiting.    She was found to have Guillain Barr syndrome and peripheral neuropathy, and she was treated with IV immunoglobulins.  She was also treated with empiric IV antibiotics for acute E. coli UTI.  Rectal inflammation was discovered on CT abdomen and pelvis.  She underwent EGD and flexible sigmoidoscopy and internal hemorrhoids were detected on colonoscopy.  She was started on vitamin B12 injection and folic acid tablets for vitamin B12 deficiency and folate deficiency respectively.  She also had anasarca  that was treated  with IV Lasix.  She also started on buspirone and Wellbutrin for depression and anxiety.  She had nocturnal hypoxemia.  OSA and OHS suspected because of morbid obesity.  CPAP had been recommended but she declined treatment.  Outpatient follow-up with pulmonologist is strongly recommended for sleep study.  She was tolerating room air during my encounter.  PT and OT recommended discharge to SNF.  Her condition has improved and she is deemed stable for discharge to SNF today.    Medical Consultants:   Gastroenterologist Psychiatrist Neurologist   Discharge Exam:    Vitals:   01/25/21 2015 01/26/21 0130 01/26/21 0407 01/26/21 1340  BP: 124/86  130/89 117/79  Pulse: 100  100 (!) 106  Resp: 20  20   Temp: 98.6 F (37 C)  99.3 F (37.4 C) 98.7 F (37.1 C)  TempSrc: Oral  Oral Oral  SpO2: 98%  98% 96%  Weight: (!) 185.2 kg (!) 185.2 kg    Height:         GEN: NAD SKIN: Warm and dry EYES: No pallor or icterus ENT: MMM CV: RRR PULM: CTA B ABD: soft, obese, NT, +BS CNS: AAO x 3, Power in bilateral upper extremities is 4/5.  Power in bilateral lower extremities is 2/5 EXT: No edema or tenderness   The results of significant diagnostics from this hospitalization (including imaging, microbiology, ancillary and laboratory) are listed below for reference.     Procedures and Diagnostic Studies:  CT Angio Abd/Pel W and/or Wo Contrast  Result Date: 12/09/2020 CLINICAL DATA:  Concern for chronic mesenteric ischemia. EXAM: CTA ABDOMEN AND PELVIS WITHOUT AND WITH CONTRAST TECHNIQUE: Multidetector CT imaging of the abdomen and pelvis was performed using the standard protocol during bolus administration of intravenous contrast. Multiplanar reconstructed images and MIPs were obtained and reviewed to evaluate the vascular anatomy. CONTRAST:  142mL OMNIPAQUE IOHEXOL 350 MG/ML SOLN COMPARISON:  CT abdomen and pelvis 06/08/2016. FINDINGS: VASCULAR Aorta: Normal caliber aorta without aneurysm,  dissection, vasculitis or significant stenosis. Celiac: Patent without evidence of aneurysm, dissection, vasculitis or significant stenosis. SMA: Patent without evidence of aneurysm, dissection, vasculitis or significant stenosis. Renals: Both renal arteries are patent without evidence of aneurysm, dissection, vasculitis, fibromuscular dysplasia or significant stenosis. IMA: Patent without evidence of aneurysm, dissection, vasculitis or significant stenosis. Inflow: Patent without evidence of aneurysm, dissection, vasculitis or significant stenosis. Proximal Outflow: Bilateral common femoral and visualized portions of the superficial and profunda femoral arteries are patent without evidence of aneurysm, dissection, vasculitis or significant stenosis. Veins: No obvious venous abnormality within the limitations of this arterial phase study. Review of the MIP images confirms the above findings. NON-VASCULAR Lower chest: No acute abnormality. Hepatobiliary: Liver is enlarged. There is diffuse fatty infiltration of the liver. No calcified gallstones are seen. There is no biliary ductal dilatation identified. Pancreas: Unremarkable. No pancreatic ductal dilatation or surrounding inflammatory changes. Spleen: Normal in size without focal abnormality. Adrenals/Urinary Tract: Adrenal glands are unremarkable. Kidneys are normal, without renal calculi, focal lesion, or hydronephrosis. Bladder is unremarkable. Stomach/Bowel: Stomach is within normal limits. Appendix appears normal. No evidence of bowel wall thickening, distention, or inflammatory changes. Lymphatic: No significant vascular findings are present. No enlarged abdominal or pelvic lymph nodes. Reproductive: Uterus and bilateral adnexa are unremarkable. Other: No abdominal wall hernia or abnormality. No abdominopelvic ascites. Musculoskeletal: No acute or significant osseous findings. IMPRESSION: VASCULAR 1. No acute vascular pathology identified. NON-VASCULAR 1. No  acute localizing process in the abdomen or pelvis. 2. Hepatomegaly and hepatic steatosis. Electronically Signed   By: Ronney Asters M.D.   On: 12/09/2020 19:22     Labs:   Basic Metabolic Panel: Recent Labs  Lab 01/21/21 0522 01/22/21 0530 01/23/21 0517 01/25/21 1019 01/26/21 0454  NA 136 137 135 134* 133*  K 3.5 3.5 3.8 3.4* 3.4*  CL 92* 92* 92* 93* 91*  CO2 35* 36* 35* 32 32  GLUCOSE 131* 105* 104* 130* 131*  BUN 14 13 15 13 13   CREATININE 0.40* 0.37* <0.30* 0.31* 0.37*  CALCIUM 9.6 9.5 9.4 9.3 9.3  MG  --   --   --   --  2.0   GFR Estimated Creatinine Clearance: 170.4 mL/min (A) (by C-G formula based on SCr of 0.37 mg/dL (L)). Liver Function Tests: No results for input(s): AST, ALT, ALKPHOS, BILITOT, PROT, ALBUMIN in the last 168 hours. No results for input(s): LIPASE, AMYLASE in the last 168 hours. No results for input(s): AMMONIA in the last 168 hours. Coagulation profile No results for input(s): INR, PROTIME in the last 168 hours.  CBC: Recent Labs  Lab 01/21/21 0522 01/22/21 0530 01/23/21 0517 01/24/21 0527 01/25/21 0505  WBC 9.4 11.6* 10.2 11.5* 12.6*  HGB 9.5* 9.4* 9.6* 9.6* 9.4*  HCT 33.0* 31.4* 31.7* 32.0* 31.9*  MCV 98.5 95.4 94.9 95.5 95.5  PLT 583* 561* 528* 582* 587*   Cardiac Enzymes: No results for input(s): CKTOTAL, CKMB, CKMBINDEX, TROPONINI in the last 168 hours. BNP: Invalid input(s):  POCBNP CBG: No results for input(s): GLUCAP in the last 168 hours. D-Dimer No results for input(s): DDIMER in the last 72 hours. Hgb A1c No results for input(s): HGBA1C in the last 72 hours. Lipid Profile No results for input(s): CHOL, HDL, LDLCALC, TRIG, CHOLHDL, LDLDIRECT in the last 72 hours. Thyroid function studies No results for input(s): TSH, T4TOTAL, T3FREE, THYROIDAB in the last 72 hours.  Invalid input(s): FREET3 Anemia work up No results for input(s): VITAMINB12, FOLATE, FERRITIN, TIBC, IRON, RETICCTPCT in the last 72  hours. Microbiology No results found for this or any previous visit (from the past 240 hour(s)).   Discharge Instructions:   Discharge Instructions     Ambulatory referral to Physical Medicine Rehab   Complete by: As directed    GBS hospital follow-up   Diet - low sodium heart healthy   Complete by: As directed    Discharge wound care:   Complete by: As directed    Keep wounds clean and dry   Increase activity slowly   Complete by: As directed       Allergies as of 01/26/2021       Reactions   Azithromycin Shortness Of Breath, Nausea And Vomiting        Medication List     STOP taking these medications    ondansetron 4 MG disintegrating tablet Commonly known as: Zofran ODT   ondansetron 4 MG tablet Commonly known as: ZOFRAN   oxyCODONE 5 MG immediate release tablet Commonly known as: Oxy IR/ROXICODONE Replaced by: oxyCODONE 5 MG/5ML solution   promethazine 25 MG suppository Commonly known as: PHENERGAN       TAKE these medications    acetaminophen 325 MG tablet Commonly known as: TYLENOL Take 2 tablets (650 mg total) by mouth every 6 (six) hours as needed for mild pain (or Fever >/= 101).   buPROPion 150 MG 24 hr tablet Commonly known as: WELLBUTRIN XL Take 1 tablet (150 mg total) by mouth daily. Start taking on: January 27, 2021   busPIRone 5 MG tablet Commonly known as: BUSPAR Take 1 tablet (5 mg total) by mouth 2 (two) times daily.   cyanocobalamin 1000 MCG/ML injection Commonly known as: (VITAMIN B-12) Inject 1 mL (1,000 mcg total) into the muscle every 30 (thirty) days. Start taking on: November 6, 123456   folic acid 1 MG tablet Commonly known as: FOLVITE Take 1 tablet (1 mg total) by mouth daily. Start taking on: January 27, 2021   lidocaine 5 % Commonly known as: LIDODERM Place 1 patch onto the skin daily. Remove & Discard patch within 12 hours or as directed by MD Start taking on: January 27, 2021   methocarbamol 500 MG  tablet Commonly known as: ROBAXIN Take 1 tablet (500 mg total) by mouth every 6 (six) hours as needed for muscle spasms.   metoprolol tartrate 25 MG tablet Commonly known as: LOPRESSOR Take 1 tablet (25 mg total) by mouth 2 (two) times daily. What changed: how much to take   oxyCODONE 5 MG/5ML solution Commonly known as: ROXICODONE Take 5 mLs (5 mg total) by mouth every 8 (eight) hours as needed for up to 5 days for moderate pain. Replaces: oxyCODONE 5 MG immediate release tablet   pantoprazole 40 MG tablet Commonly known as: PROTONIX Take 1 tablet (40 mg total) by mouth 2 (two) times daily.   polyethylene glycol 17 g packet Commonly known as: MIRALAX / GLYCOLAX Take 17 g by mouth daily. Start taking on: January 27, 2021  pregabalin 75 MG capsule Commonly known as: LYRICA Take 1 capsule (75 mg total) by mouth 3 (three) times daily.   senna-docusate 8.6-50 MG tablet Commonly known as: Senokot-S Take 1 tablet by mouth at bedtime as needed for mild constipation.   Vitamin D3 25 MCG tablet Commonly known as: Vitamin D Take 2 tablets (2,000 Units total) by mouth daily.               Discharge Care Instructions  (From admission, onward)           Start     Ordered   01/26/21 0000  Discharge wound care:       Comments: Keep wounds clean and dry   01/26/21 1355               If you experience worsening of your admission symptoms, develop shortness of breath, life threatening emergency, suicidal or homicidal thoughts you must seek medical attention immediately by calling 911 or calling your MD immediately  if symptoms less severe.   You must read complete instructions/literature along with all the possible adverse reactions/side effects for all the medicines you take and that have been prescribed to you. Take any new medicines after you have completely understood and accept all the possible adverse reactions/side effects.    Please note   You were cared  for by a hospitalist during your hospital stay. If you have any questions about your discharge medications or the care you received while you were in the hospital after you are discharged, you can call the unit and asked to speak with the hospitalist on call if the hospitalist that took care of you is not available. Once you are discharged, your primary care physician will handle any further medical issues. Please note that NO REFILLS for any discharge medications will be authorized once you are discharged, as it is imperative that you return to your primary care physician (or establish a relationship with a primary care physician if you do not have one) for your aftercare needs so that they can reassess your need for medications and monitor your lab values.       Time coordinating discharge: 37 minutes  Signed:  Joell Buerger  [facility] 01/26/2021, 1:55 PM   Pager on [email or web address]. If 7PM-7AM, please contact night-coverage at [email or web address]

## 2021-01-26 NOTE — Progress Notes (Signed)
Patient discharge via stretcher transport by [facility]. Discharge instruction given to transporters. Patients belongings given to transports.

## 2021-01-26 NOTE — Progress Notes (Signed)
Called [facility] facility to give nurse to nurse report. Nurse unavailable for report. This RN left name and call back number for nurse. PIV removed. AVS printed and placed in envelope for facility use. VSS. SpO2 93% on room air; at rest. Pt educated on breathing techniques. Pt verbalized understanding with teach back method.

## 2021-01-26 NOTE — Progress Notes (Signed)
Inputting a recommendation for a Scientist, water quality.  [place] [employer] [place].    01/26/21 1300  Companion Sitter  [employer]  Recommended  Hours needed 4 hours  Companion reason Emotional support

## 2021-01-26 NOTE — Progress Notes (Signed)
Physical Therapy Treatment Patient Details Name: Carla Little MRN: [id number] DOB: 1990-09-11 Today's Date: 01/26/2021   History of Present Illness Patient is a 30 y.o. female who presented to [facility] for intractable N/V on 9/17. ED labwork reveals e. coli UTI. Pt had recent hospital admission from 8/8-9/5 due to Lt knee dislocation which was reduced with fractures of the proximal fibula ligamentous avulsion laterally as well as medially off the medial femoral condyle and MRI showed complete ACL & PCL tears and MCL strain. That hospital admission was complicated by nausea, vomiting, tachycardia, UTI, and fecal impaction;. PMH significant for morbid obesity, GERD, fatty liver disease, depression. Patient now with Guillian-Barre syndrome    PT Comments    The patient is supine, each leg abducted to bed edge. Provided patient with a leg lifter to self assist sliding legs toward adduction. Patient manipulated  hands through loop with difficulty but performed. Patient 's HOB at ~ 40 degress. Patient able to reach leg loop over each foot,separately, and pull with both hands, reaching over with opposite hand to self assist using both extremities sliding each leg toward midline.    Recommendations for follow up therapy are one component of a multi-disciplinary discharge planning process, led by the attending physician.  Recommendations may be updated based on patient status, additional functional criteria and insurance authorization.  Follow Up Recommendations  Skilled nursing-short term rehab (<3 hours/day)     Assistance Recommended at Discharge Frequent or constant Supervision/Assistance  Equipment Recommendations  Wheelchair (measurements PT);Wheelchair cushion (measurements PT)    Recommendations for Other Services       Precautions / Restrictions Precautions Precautions: Fall Precaution Comments: LE's remain very weak, maxisky OOB Other Brace: does not need KI at this time but may  consider since opatient has more weakness due GBS     Mobility                           Balance                                            Cognition Arousal/Alertness: Awake/alert Behavior During Therapy: WFL for tasks assessed/performed;Flat affect                                   General Comments: intermittent cheerful and flat, expresses concern for going to a facility        Exercises      General Comments        Pertinent Vitals/Pain Faces Pain Scale: Hurts even more Pain Location: skin to touch all over Pain Descriptors / Indicators: Pins and needles;Tingling Pain Intervention(s): Monitored during session    Home Living                          Prior Function            PT Goals (current goals can now be found in the care plan section) Progress towards PT goals: Progressing toward goals    Frequency    Min 2X/week      PT Plan Current plan remains appropriate    Co-evaluation              AM-PAC PT "6 Clicks" Mobility   Outcome  Measure  Help needed turning from your back to your side while in a flat bed without using bedrails?: A Lot Help needed moving from lying on your back to sitting on the side of a flat bed without using bedrails?: A Lot Help needed moving to and from a bed to a chair (including a wheelchair)?: Total Help needed standing up from a chair using your arms (e.g., wheelchair or bedside chair)?: Total Help needed to walk in hospital room?: Total Help needed climbing 3-5 steps with a railing? : Total 6 Click Score: 8    End of Session Equipment Utilized During Treatment: Oxygen Activity Tolerance: Patient tolerated treatment well Patient left: in bed;with call bell/phone within reach Nurse Communication: Mobility status;Need for lift equipment PT Visit Diagnosis: Muscle weakness (generalized) (M62.81);Other symptoms and signs involving the nervous system [id number])      Time: [id number] PT Time Calculation (min) (ACUTE ONLY): 15 min  Charges:  $Self Care/Home Management: [place] Pager [phone] Office [phone]    Claretha Cooper 01/26/2021, 1:38 PM

## 2021-01-26 NOTE — Progress Notes (Signed)
Mobility Specialist - Progress Note    01/26/21 1459  Mobility  Activity Repositioned in chair  Range of Motion/Exercises Active;Right arm;Left arm  Level of Assistance Standby assist, set-up cues, supervision of patient - no hands on  Assistive Device None  Mobility Sit up in bed/chair position for meals  Mobility Response Tolerated well  Mobility performed by Mobility specialist  $Mobility charge 1 Mobility   Pt participated in 5 arms raises and 5 lateral raises prior to discussing d/c plans. Pt emotional at this time as she prepares for d/c, but tolerated session well. Pt was encouraged to complete UE exercises. Session ended early due to staff visits and preparing pt for d/c. Pt left in room with RN and NT in room and call bell at side.  Arliss Journey Mobility Specialist Acute Rehabilitation Services Phone: [phone] 01/26/21, 3:03 PM

## 2021-01-26 NOTE — TOC Transition Note (Signed)
Transition of Care [facility]) - CM/SW Discharge Note   Patient Details  Name: Carla Little MRN: [id number] Date of Birth: 05-Oct-1990  Transition [facility] [place] CM/SW Contact:  Ida Rogue, LCSW Phone Number: 01/26/2021, 2:27 PM   Clinical Narrative:   Leadership has been working behind the scenes to find a bed for this patient with the promise of letter of guarantee as Ms Kang is MCD pending. Actually, disability is pending but patient states she got a letter this week stating she had been approved for MCD. [facility] in [place] made bed offer for today.  Patient informed.  She states she has a way to get her belongings to [place]. She and I tried to call a cousin to let them know of plan.  No answer and unable to leave a message.  PTAR arranged.  Nursing, please call report to [phone]. Room A22.  TOC sign off.    Final next level of care: Skilled Nursing Facility Barriers to Discharge: Barriers Resolved   Patient Goals and CMS Choice        Discharge Placement                       Discharge Plan and Services                                     Social Determinants of Health (SDOH) Interventions     Readmission Risk Interventions No flowsheet data found.

## 2021-02-24 ENCOUNTER — Institutional Professional Consult (permissible substitution): Payer: Self-pay | Admitting: Pulmonary Disease

## 2021-03-29 ENCOUNTER — Institutional Professional Consult (permissible substitution): Payer: Self-pay | Admitting: Pulmonary Disease

## 2021-04-09 ENCOUNTER — Emergency Department (HOSPITAL_COMMUNITY)
Admission: EM | Admit: 2021-04-09 | Discharge: 2021-04-10 | Disposition: A | Discharge status: Home / Self Care | Payer: Medicaid Other | Attending: Student | Admitting: Student

## 2021-04-09 ENCOUNTER — Other Ambulatory Visit: Payer: Self-pay

## 2021-04-09 ENCOUNTER — Encounter (HOSPITAL_COMMUNITY): Payer: Self-pay | Admitting: Emergency Medicine

## 2021-04-09 DIAGNOSIS — Z8616 Personal history of COVID-19: Secondary | ICD-10-CM | POA: Insufficient documentation

## 2021-04-09 DIAGNOSIS — U071 COVID-19: Secondary | ICD-10-CM | POA: Insufficient documentation

## 2021-04-09 DIAGNOSIS — R112 Nausea with vomiting, unspecified: Secondary | ICD-10-CM | POA: Diagnosis present

## 2021-04-09 DIAGNOSIS — Z79899 Other long term (current) drug therapy: Secondary | ICD-10-CM | POA: Insufficient documentation

## 2021-04-09 DIAGNOSIS — R000 Tachycardia, unspecified: Secondary | ICD-10-CM | POA: Diagnosis not present

## 2021-04-09 HISTORY — DX: Guillain-Barre syndrome: G61.0

## 2021-04-09 LAB — CBC WITH DIFFERENTIAL/PLATELET
Abs Immature Granulocytes: 0.03 10*3/uL (ref 0.00–0.07)
Basophils Absolute: 0 10*3/uL (ref 0.0–0.1)
Basophils Relative: 0 %
Eosinophils Absolute: 0 10*3/uL (ref 0.0–0.5)
Eosinophils Relative: 0 %
HCT: 37.4 % (ref 36.0–46.0)
Hemoglobin: 12 g/dL (ref 12.0–15.0)
Immature Granulocytes: 0 %
Lymphocytes Relative: 34 %
Lymphs Abs: 2.6 10*3/uL (ref 0.7–4.0)
MCH: 26.8 pg (ref 26.0–34.0)
MCHC: 32.1 g/dL (ref 30.0–36.0)
MCV: 83.5 fL (ref 80.0–100.0)
Monocytes Absolute: 0.7 10*3/uL (ref 0.1–1.0)
Monocytes Relative: 9 %
Neutro Abs: 4.2 10*3/uL (ref 1.7–7.7)
Neutrophils Relative %: 57 %
Platelets: 359 10*3/uL (ref 150–400)
RBC: 4.48 MIL/uL (ref 3.87–5.11)
RDW: 21.1 % — ABNORMAL HIGH (ref 11.5–15.5)
WBC: 7.6 10*3/uL (ref 4.0–10.5)
nRBC: 0 % (ref 0.0–0.2)

## 2021-04-09 LAB — COMPREHENSIVE METABOLIC PANEL
ALT: 49 U/L — ABNORMAL HIGH (ref 0–44)
AST: 228 U/L — ABNORMAL HIGH (ref 15–41)
Albumin: 2.9 g/dL — ABNORMAL LOW (ref 3.5–5.0)
Alkaline Phosphatase: 81 U/L (ref 38–126)
Anion gap: 9 (ref 5–15)
BUN: 5 mg/dL — ABNORMAL LOW (ref 6–20)
CO2: 28 mmol/L (ref 22–32)
Calcium: 8.2 mg/dL — ABNORMAL LOW (ref 8.9–10.3)
Chloride: 98 mmol/L (ref 98–111)
Creatinine, Ser: 0.48 mg/dL (ref 0.44–1.00)
GFR, Estimated: >60 mL/min (ref >60)
Glucose, Bld: 90 mg/dL (ref 70–99)
Potassium: 3.7 mmol/L (ref 3.5–5.1)
Sodium: 135 mmol/L (ref 135–145)
Total Bilirubin: 1.2 mg/dL (ref 0.3–1.2)
Total Protein: 7.3 g/dL (ref 6.5–8.1)

## 2021-04-09 LAB — RESP PANEL BY RT-PCR (FLU A&B, COVID) ARPGX2
Influenza A by PCR: NEGATIVE
Influenza B by PCR: NEGATIVE
SARS Coronavirus 2 by RT PCR: POSITIVE — AB

## 2021-04-09 LAB — LIPASE, BLOOD: Lipase: 24 U/L (ref 11–51)

## 2021-04-09 MED ORDER — ONDANSETRON HCL 4 MG/2ML IJ SOLN
4.0000 mg | Freq: Once | INTRAMUSCULAR | Status: AC
  Administered 2021-04-09: 4 mg via INTRAVENOUS
  Filled 2021-04-09: qty 2

## 2021-04-09 MED ORDER — SODIUM CHLORIDE 0.9 % IV BOLUS
2000.0000 mL | Freq: Once | INTRAVENOUS | Status: AC
Start: 2021-04-09 — End: 2021-04-09
  Administered 2021-04-09: 2000 mL via INTRAVENOUS

## 2021-04-09 MED ORDER — ONDANSETRON 4 MG PO TBDP: 4.0000 mg | ORAL_TABLET | Freq: Three times a day (TID) | ORAL | 0 refills | Status: DC | PRN

## 2021-04-09 MED ORDER — MORPHINE SULFATE (PF) 4 MG/ML IV SOLN
4.0000 mg | Freq: Once | INTRAVENOUS | Status: AC
  Administered 2021-04-09: 4 mg via INTRAVENOUS
  Filled 2021-04-09: qty 1

## 2021-04-09 NOTE — ED Provider Notes (Signed)
[facility] EMERGENCY DEPARTMENT Provider Note   CSN: [id number] Arrival date & time: 04/09/21  1352     History  Chief Complaint  Patient presents with   Emesis    Carla Little is a 31 y.o. female.  31 year old female presents today for evaluation of abdominal pain, nausea, and vomiting since Wednesday following recent COVID diagnosis.  Patient reports Sunday she came down with headache, shortness of breath which have improved but she requested to be tested for COVID which she initially tested negative on Sunday but then on Wednesday during routine COVID check at her facility she tested positive.  She reports since then she has been nauseous and vomiting and has been unable to keep any food down.  She reports she has been able to drink some water but has not been taking her home medications.  She denies fever, chills.  She reports abdominal pain which she describes as soreness from the vomiting.  Denies current shortness of breath, chest pain, palpitations, lightheadedness.  She describes her emesis as nonbloody and nonbilious.  She reports she was given Zofran at the rehab facility however was not the disintegrating tablet and she was unable to keep it down.  The history is provided by the patient. No language interpreter was used.      Home Medications Prior to Admission medications   Medication Sig Start Date End Date Taking? Authorizing Provider  acetaminophen (TYLENOL) 325 MG tablet Take 2 tablets (650 mg total) by mouth every 6 (six) hours as needed for mild pain (or Fever >/= 101). 01/26/21   Jennye Boroughs, MD  buPROPion (WELLBUTRIN XL) 150 MG 24 hr tablet Take 1 tablet (150 mg total) by mouth daily. 01/27/21   Jennye Boroughs, MD  busPIRone (BUSPAR) 5 MG tablet Take 1 tablet (5 mg total) by mouth 2 (two) times daily. 01/26/21   Jennye Boroughs, MD  cholecalciferol (VITAMIN D) 25 MCG tablet Take 2 tablets (2,000 Units total) by mouth daily. 11/28/20   Shawna Clamp, MD   cyanocobalamin (,VITAMIN B-12,) 1000 MCG/ML injection Inject 1 mL (1,000 mcg total) into the muscle every 30 (thirty) days. 01/29/21   Jennye Boroughs, MD  folic acid (FOLVITE) 1 MG tablet Take 1 tablet (1 mg total) by mouth daily. 01/27/21   Jennye Boroughs, MD  lidocaine (LIDODERM) 5 % Place 1 patch onto the skin daily. Remove & Discard patch within 12 hours or as directed by MD 01/27/21   Jennye Boroughs, MD  methocarbamol (ROBAXIN) 500 MG tablet Take 1 tablet (500 mg total) by mouth every 6 (six) hours as needed for muscle spasms. 11/28/20   Shawna Clamp, MD  metoprolol tartrate (LOPRESSOR) 25 MG tablet Take 1 tablet (25 mg total) by mouth 2 (two) times daily. 01/26/21   Jennye Boroughs, MD  pantoprazole (PROTONIX) 40 MG tablet Take 1 tablet (40 mg total) by mouth 2 (two) times daily. 01/26/21   Jennye Boroughs, MD  polyethylene glycol (MIRALAX / GLYCOLAX) 17 g packet Take 17 g by mouth daily. 01/27/21   Jennye Boroughs, MD  pregabalin (LYRICA) 75 MG capsule Take 1 capsule (75 mg total) by mouth 3 (three) times daily. 01/26/21   Jennye Boroughs, MD  senna-docusate (SENOKOT-S) 8.6-50 MG tablet Take 1 tablet by mouth at bedtime as needed for mild constipation. 01/26/21   Jennye Boroughs, MD      Allergies    Azithromycin    Review of Systems   Review of Systems  Constitutional:  Negative for activity change, chills  and fever.  Respiratory:  Negative for shortness of breath.   Cardiovascular:  Negative for chest pain and palpitations.  Gastrointestinal:  Positive for abdominal pain, nausea and vomiting. Negative for anal bleeding, constipation and diarrhea.  Neurological:  Negative for weakness, light-headedness and headaches.  All other systems reviewed and are negative.  Physical Exam Updated Vital Signs BP 102/72 (BP Location: Left Arm)    Pulse (!) 101    Temp 98.5 F (36.9 C) (Oral)    Resp 20    Ht '5\' 4"'  (1.626 m)    Wt (!) 185.2 kg    LMP 04/09/2021 (Exact Date)    SpO2 99%    BMI 70.08 kg/m   Physical Exam Vitals and nursing note reviewed.  Constitutional:      General: She is not in acute distress.    Appearance: Normal appearance. She is not ill-appearing.  HENT:     Head: Normocephalic and atraumatic.     Nose: Nose normal.  Eyes:     General: No scleral icterus.    Extraocular Movements: Extraocular movements intact.     Conjunctiva/sclera: Conjunctivae normal.  Cardiovascular:     Rate and Rhythm: Regular rhythm. Tachycardia present.     Pulses: Normal pulses.     Heart sounds: Normal heart sounds.  Pulmonary:     Effort: Pulmonary effort is normal. No respiratory distress.     Breath sounds: Normal breath sounds. No wheezing or rales.  Abdominal:     General: There is no distension.     Palpations: Abdomen is soft.     Tenderness: There is no abdominal tenderness. There is no guarding.  Musculoskeletal:        General: Normal range of motion.     Cervical back: Normal range of motion.     Right lower leg: No edema.     Left lower leg: No edema.  Skin:    General: Skin is warm and dry.  Neurological:     General: No focal deficit present.     Mental Status: She is alert. Mental status is at baseline.    ED Results / Procedures / Treatments   Labs (all labs ordered are listed, but only abnormal results are displayed) Labs Reviewed  CBC WITH DIFFERENTIAL/PLATELET - Abnormal; Notable for the following components:      Result Value   RDW 21.1 (*)    All other components within normal limits  COMPREHENSIVE METABOLIC PANEL - Abnormal; Notable for the following components:   BUN 5 (*)    Calcium 8.2 (*)    Albumin 2.9 (*)    AST 228 (*)    ALT 49 (*)    All other components within normal limits  LIPASE, BLOOD    EKG None  Radiology No results found.  Procedures Procedures    Medications Ordered in ED Medications  ondansetron (ZOFRAN) injection 4 mg (4 mg Intravenous Given 04/09/21 1535)  sodium chloride 0.9 % bolus 2,000 mL (2,000 mLs  Intravenous New Bag/Given 04/09/21 1541)    ED Course/ Medical Decision Making/ A&P Clinical Course as of 04/09/21 1753  Sun Apr 09, 2021  1751 Patient with some improvement in symptoms since arrival.  Abdomen remains benign on exam.  Work-up without leukocytosis, anemia.  Lipase within normal limits.  Electrolytes within normal limits.  Mild transaminitis however this is consistent with her previous labs and nonalcoholic fatty liver disease.  Patient has not had any emesis since arrival.  Awaiting respiratory panel. [AA]  Clinical Course User Index [AA] Evlyn Courier, PA-C                           Medical Decision Making  Medical Decision Making / ED Course   This patient presents to the ED for concern of persistent nausea and vomiting, this involves an extensive number of treatment options, and is a complaint that carries with it a high risk of complications and morbidity.  The differential diagnosis includes viral gastroenteritis, acute intra-abdominal process, COVID  MDM: 31 year old female presents today for evaluation of nausea and vomiting of 5-day duration.  Patient reports she tested positive for COVID on Wednesday.  Endorses decreased p.o. intake.  Work-up significant for electrolytes which are within normal limits, preserved renal function, elevated AST and ALT without hyperbilirubinemia or alk phos elevation, CBC without leukocytosis or anemia.  Lipase within normal limits.  Patient on reevaluation following IV fluids and Zofran does report some improvement in her symptoms.  She has not had an episode of emesis in the emergency room.  She has tolerated p.o. intake in the emergency room with emesis.  Discussed plan with discharge with Zofran.  Patient voices understanding and is in agreement with plan.  Abdomen benign on exam and without leukocytosis, fever.  Low concern for acute intra-abdominal process.   Lab Tests: -I ordered, reviewed, and interpreted labs.   The pertinent  results include:   Labs Reviewed  CBC WITH DIFFERENTIAL/PLATELET - Abnormal; Notable for the following components:      Result Value   RDW 21.1 (*)    All other components within normal limits  COMPREHENSIVE METABOLIC PANEL - Abnormal; Notable for the following components:   BUN 5 (*)    Calcium 8.2 (*)    Albumin 2.9 (*)    AST 228 (*)    ALT 49 (*)    All other components within normal limits  LIPASE, BLOOD      EKG  EKG Interpretation  Date/Time:    Ventricular Rate:    PR Interval:    QRS Duration:   QT Interval:    QTC Calculation:   R Axis:     Text Interpretation:          Medicines ordered and prescription drug management: Meds ordered this encounter  Medications   ondansetron (ZOFRAN) injection 4 mg   sodium chloride 0.9 % bolus 2,000 mL    -I have reviewed the patients home medicines and have made adjustments as needed  Cardiac Monitoring: The patient was maintained on a cardiac monitor.  I personally viewed and interpreted the cardiac monitored which showed an underlying rhythm of: Sinus tachycardia initially improved to normal sinus rhythm following IV hydration.  Reevaluation: After the interventions noted above, I reevaluated the patient and found that they have :improved Patient noticed some improvement in her symptoms.  Abdomen remains benign.  Patient without episodes of emesis in the emergency room.  Co morbidities that complicate the patient evaluation  Past Medical History:  Diagnosis Date   Class 3 obesity ([place]) 12/09/2020   Depression    GERD (gastroesophageal reflux disease)    Guillain Barr syndrome ([facility])    Nonalcoholic steatohepatitis ([facility]) 12/09/2020   Obesity    PUD (peptic ulcer disease) 12/09/2020      Dispostion: Patient is appropriate for discharge.  Medication sent into patient's choice of pharmacy.  Return precautions discussed.  Patient voices understanding and is in agreement with plan.  Plan  of care discussed with  Dr. Melina Copa.   Final Clinical Impression(s) / ED Diagnoses Final diagnoses:  [id number]    Rx / DC Orders ED Discharge Orders          Ordered    ondansetron (ZOFRAN-ODT) 4 MG disintegrating tablet  Every 8 hours PRN        04/09/21 1916              Evlyn Courier, PA-C 04/09/21 1921    Hayden Rasmussen, MD 04/10/21 [phone]

## 2021-04-09 NOTE — Discharge Instructions (Signed)
Your work-up in the emergency room was reassuring.  You are COVID-positive.  He received IV hydration with 2 L bolus in the emergency room as well as Zofran.  You are able to tolerate drinking water and did not have an episode of vomiting in the emergency room.  I have sent in Zofran to your pharmacy.  If you have worsening symptoms and are unable to tolerate food or drink despite taking Zofran please return to the emergency room.  Otherwise this is likely secondary to COVID infection and this can take up to 5 days to run its course.

## 2021-04-09 NOTE — ED Triage Notes (Signed)
Pt to the from [facility] with nausea and vomiting for the last 5 days.  Pt has been unable to take her home medication.

## 2021-04-11 ENCOUNTER — Inpatient Hospital Stay (HOSPITAL_COMMUNITY)
Admission: EM | Admit: 2021-04-11 | Discharge: 2021-04-17 | DRG: 177 | Disposition: A | Discharge status: Skilled Nursing Facility | Payer: Medicaid Other | Source: Skilled Nursing Facility | Attending: Internal Medicine | Admitting: Internal Medicine

## 2021-04-11 ENCOUNTER — Emergency Department (HOSPITAL_COMMUNITY): Payer: Medicaid Other

## 2021-04-11 ENCOUNTER — Other Ambulatory Visit: Payer: Self-pay

## 2021-04-11 ENCOUNTER — Encounter (HOSPITAL_COMMUNITY): Payer: Self-pay

## 2021-04-11 DIAGNOSIS — E66813 Obesity, class 3: Secondary | ICD-10-CM | POA: Diagnosis present

## 2021-04-11 DIAGNOSIS — F419 Anxiety disorder, unspecified: Secondary | ICD-10-CM | POA: Diagnosis present

## 2021-04-11 DIAGNOSIS — E876 Hypokalemia: Secondary | ICD-10-CM | POA: Diagnosis present

## 2021-04-11 DIAGNOSIS — L89312 Pressure ulcer of right buttock, stage 2: Secondary | ICD-10-CM | POA: Diagnosis present

## 2021-04-11 DIAGNOSIS — K7581 Nonalcoholic steatohepatitis (NASH): Secondary | ICD-10-CM | POA: Diagnosis present

## 2021-04-11 DIAGNOSIS — J1282 Pneumonia due to coronavirus disease 2019: Secondary | ICD-10-CM | POA: Diagnosis present

## 2021-04-11 DIAGNOSIS — Z881 Allergy status to other antibiotic agents status: Secondary | ICD-10-CM

## 2021-04-11 DIAGNOSIS — Z6841 Body Mass Index (BMI) 40.0 and over, adult: Secondary | ICD-10-CM

## 2021-04-11 DIAGNOSIS — U071 COVID-19: Principal | ICD-10-CM | POA: Diagnosis present

## 2021-04-11 DIAGNOSIS — Z8669 Personal history of other diseases of the nervous system and sense organs: Secondary | ICD-10-CM

## 2021-04-11 DIAGNOSIS — K5900 Constipation, unspecified: Secondary | ICD-10-CM | POA: Diagnosis present

## 2021-04-11 DIAGNOSIS — N390 Urinary tract infection, site not specified: Secondary | ICD-10-CM | POA: Diagnosis present

## 2021-04-11 DIAGNOSIS — J9601 Acute respiratory failure with hypoxia: Secondary | ICD-10-CM

## 2021-04-11 DIAGNOSIS — L89322 Pressure ulcer of left buttock, stage 2: Secondary | ICD-10-CM | POA: Diagnosis present

## 2021-04-11 DIAGNOSIS — E860 Dehydration: Secondary | ICD-10-CM | POA: Diagnosis present

## 2021-04-11 DIAGNOSIS — G610 Guillain-Barre syndrome: Secondary | ICD-10-CM | POA: Diagnosis present

## 2021-04-11 DIAGNOSIS — F32A Depression, unspecified: Secondary | ICD-10-CM | POA: Diagnosis present

## 2021-04-11 DIAGNOSIS — K219 Gastro-esophageal reflux disease without esophagitis: Secondary | ICD-10-CM | POA: Diagnosis present

## 2021-04-11 DIAGNOSIS — Z79899 Other long term (current) drug therapy: Secondary | ICD-10-CM

## 2021-04-11 DIAGNOSIS — R112 Nausea with vomiting, unspecified: Secondary | ICD-10-CM | POA: Diagnosis present

## 2021-04-11 LAB — CBC WITH DIFFERENTIAL/PLATELET
Abs Immature Granulocytes: 0.03 10*3/uL (ref 0.00–0.07)
Basophils Absolute: 0 10*3/uL (ref 0.0–0.1)
Basophils Relative: 0 %
Eosinophils Absolute: 0 10*3/uL (ref 0.0–0.5)
Eosinophils Relative: 0 %
HCT: 35 % — ABNORMAL LOW (ref 36.0–46.0)
Hemoglobin: 11.2 g/dL — ABNORMAL LOW (ref 12.0–15.0)
Immature Granulocytes: 0 %
Lymphocytes Relative: 33 %
Lymphs Abs: 2.7 10*3/uL (ref 0.7–4.0)
MCH: 26.6 pg (ref 26.0–34.0)
MCHC: 32 g/dL (ref 30.0–36.0)
MCV: 83.1 fL (ref 80.0–100.0)
Monocytes Absolute: 0.7 10*3/uL (ref 0.1–1.0)
Monocytes Relative: 8 %
Neutro Abs: 4.7 10*3/uL (ref 1.7–7.7)
Neutrophils Relative %: 59 %
Platelets: 369 10*3/uL (ref 150–400)
RBC: 4.21 MIL/uL (ref 3.87–5.11)
RDW: 21 % — ABNORMAL HIGH (ref 11.5–15.5)
WBC: 8.1 10*3/uL (ref 4.0–10.5)
nRBC: 0 % (ref 0.0–0.2)

## 2021-04-11 MED ORDER — SODIUM CHLORIDE 0.9 % IV SOLN
25.0000 mg | Freq: Once | INTRAVENOUS | Status: AC
  Administered 2021-04-11: 25 mg via INTRAVENOUS
  Filled 2021-04-11: qty 1

## 2021-04-11 MED ORDER — PROMETHAZINE HCL 25 MG/ML IJ SOLN
INTRAMUSCULAR | Status: AC
  Filled 2021-04-11: qty 1

## 2021-04-11 MED ORDER — LACTATED RINGERS IV BOLUS
1000.0000 mL | Freq: Once | INTRAVENOUS | Status: AC
  Administered 2021-04-11: 1000 mL via INTRAVENOUS

## 2021-04-11 NOTE — ED Provider Notes (Signed)
[facility] EMERGENCY DEPARTMENT Provider Note   CSN: [id number] Arrival date & time: 04/11/21  1929     History  Chief Complaint  Patient presents with   Hypoxia    COVID    Carla Little is a 31 y.o. female.  HPI Patient presents for shortness of breath, hypoxia, and persistent nausea and vomiting.  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  She underwent IVIG therapy and has had persistent nausea that is suspected to be secondary to gastroparesis.  She has ongoing weakness in her lower extremities and has required rehab facility placement.  She reports that she has since been able to get to edge of bed but has not yet been able to stand.  She was seen in the emergency department 2 days ago for nausea and vomiting.  At that time, she was diagnosed with COVID-19.  She reports that she has had persistent nausea, vomiting, and p.o. intolerance.  Additionally, she developed hypoxia today.  Patient does have access to supplemental oxygen at her facility.  Currently, she endorses shortness of breath and continued nausea.    Home Medications Prior to Admission medications   Medication Sig Start Date End Date Taking? Authorizing Provider  acetaminophen (TYLENOL) 325 MG tablet Take 2 tablets (650 mg total) by mouth every 6 (six) hours as needed for mild pain (or Fever >/= 101). 01/26/21  Yes Jennye Boroughs, MD  buPROPion [facility] SR) 150 MG 12 hr tablet Take 150 mg by mouth daily.   Yes [provider]  busPIRone (BUSPAR) 5 MG tablet Take 1 tablet (5 mg total) by mouth 2 (two) times daily. 01/26/21  Yes Jennye Boroughs, MD  cholecalciferol (VITAMIN D) 25 MCG tablet Take 2 tablets (2,000 Units total) by mouth daily. 11/28/20  Yes Shawna Clamp, MD  cyanocobalamin (,VITAMIN B-12,) 1000 MCG/ML injection Inject 1 mL (1,000 mcg total) into the muscle every 30 (thirty) days. 01/29/21  Yes Jennye Boroughs, MD  diphenhydrAMINE  (BENADRYL) 25 MG tablet Take 25 mg by mouth every 8 (eight) hours as needed for itching.   Yes [provider]  divalproex (DEPAKOTE SPRINKLE) 125 MG capsule Take 125 mg by mouth 2 (two) times daily.   Yes [provider]  folic acid (FOLVITE) 1 MG tablet Take 1 tablet (1 mg total) by mouth daily. 01/27/21  Yes Jennye Boroughs, MD  hydrocortisone cream 1 % Apply 1 application topically 3 (three) times daily.   Yes [provider]  lidocaine (LIDODERM) 5 % Place 1 patch onto the skin daily. Remove & Discard patch within 12 hours or as directed by MD 01/27/21  Yes Jennye Boroughs, MD  melatonin 5 MG TABS Take 10 mg by mouth at bedtime.   Yes [provider]  methocarbamol (ROBAXIN) 500 MG tablet Take 1 tablet (500 mg total) by mouth every 6 (six) hours as needed for muscle spasms. 11/28/20  Yes Shawna Clamp, MD  metoprolol tartrate (LOPRESSOR) 25 MG tablet Take 1 tablet (25 mg total) by mouth 2 (two) times daily. 01/26/21  Yes Jennye Boroughs, MD  omeprazole (PRILOSEC) 20 MG capsule Take 20 mg by mouth in the morning and at bedtime.   Yes [provider]  ondansetron (ZOFRAN-ODT) 4 MG disintegrating tablet Take 1 tablet (4 mg total) by mouth every 8 (eight) hours as needed for nausea or vomiting. 04/09/21  Yes Ali, Amjad, PA-C  oxyCODONE (OXY IR/ROXICODONE) 5 MG immediate release tablet Take 5  mg by mouth every 8 (eight) hours as needed for severe pain.   Yes [provider]  polyethylene glycol (MIRALAX / GLYCOLAX) 17 g packet Take 17 g by mouth daily. 01/27/21  Yes Jennye Boroughs, MD  pregabalin (LYRICA) 75 MG capsule Take 1 capsule (75 mg total) by mouth 3 (three) times daily. 01/26/21  Yes Jennye Boroughs, MD  senna-docusate (SENOKOT-S) 8.6-50 MG tablet Take 1 tablet by mouth at bedtime as needed for mild constipation. 01/26/21  Yes Jennye Boroughs, MD  buPROPion (WELLBUTRIN XL) 150 MG 24 hr tablet Take 1 tablet (150 mg total) by mouth daily. Patient not  taking: Reported on 04/12/2021 01/27/21   Jennye Boroughs, MD  pantoprazole (PROTONIX) 40 MG tablet Take 1 tablet (40 mg total) by mouth 2 (two) times daily. Patient not taking: Reported on 04/12/2021 01/26/21   Jennye Boroughs, MD      Allergies    Azithromycin    Review of Systems   Review of Systems  Constitutional:  Positive for fatigue. Negative for chills and fever.  HENT:  Negative for ear pain and sore throat.   Eyes:  Negative for pain and visual disturbance.  Respiratory:  Positive for shortness of breath. Negative for cough, wheezing and stridor.   Cardiovascular:  Negative for chest pain and palpitations.  Gastrointestinal:  Positive for nausea and vomiting. Negative for abdominal distention, abdominal pain and diarrhea.  Genitourinary:  Negative for dysuria, hematuria and pelvic pain.  Musculoskeletal:  Negative for arthralgias, back pain, myalgias and neck pain.  Skin:  Negative for color change and rash.  Neurological:  Positive for weakness (Chronic). Negative for dizziness, seizures, syncope, light-headedness and headaches.  Hematological:  Does not bruise/bleed easily.  All other systems reviewed and are negative.  Physical Exam Updated Vital Signs BP 111/72 (BP Location: Right Arm)    Pulse 97    Temp 98.3 F (36.8 C) (Oral)    Resp 18    LMP 04/09/2021 (Exact Date)    SpO2 91%  Physical Exam Vitals and nursing note reviewed.  Constitutional:      General: She is not in acute distress.    Appearance: Normal appearance. She is well-developed. She is obese. She is not ill-appearing, toxic-appearing or diaphoretic.  HENT:     Head: Normocephalic and atraumatic.     Right Ear: External ear normal.     Left Ear: External ear normal.     Nose: Nose normal.     Mouth/Throat:     Mouth: Mucous membranes are moist.     Pharynx: Oropharynx is clear.  Eyes:     General: No scleral icterus.    Extraocular Movements: Extraocular movements intact.     Conjunctiva/sclera:  Conjunctivae normal.  Cardiovascular:     Rate and Rhythm: Regular rhythm. Tachycardia present.     Heart sounds: No murmur heard. Pulmonary:     Effort: Pulmonary effort is normal. No respiratory distress.     Breath sounds: Normal breath sounds.  Abdominal:     Palpations: Abdomen is soft.     Tenderness: There is no abdominal tenderness.  Musculoskeletal:        General: No swelling or deformity.     Cervical back: Normal range of motion and neck supple. No rigidity or tenderness.  Skin:    General: Skin is warm and dry.     Capillary Refill: Capillary refill takes less than 2 seconds.     Coloration: Skin is not jaundiced or pale.  Neurological:  Mental Status: She is alert and oriented to person, place, and time. Mental status is at baseline.  Psychiatric:        Mood and Affect: Mood normal.        Behavior: Behavior normal.    ED Results / Procedures / Treatments   Labs (all labs ordered are listed, but only abnormal results are displayed) Labs Reviewed  CBC WITH DIFFERENTIAL/PLATELET - Abnormal; Notable for the following components:      Result Value   Hemoglobin 11.2 (*)    HCT 35.0 (*)    RDW 21.0 (*)    All other components within normal limits  COMPREHENSIVE METABOLIC PANEL - Abnormal; Notable for the following components:   Potassium 2.5 (*)    BUN <5 (*)    Creatinine, Ser <0.30 (*)    Calcium 8.0 (*)    Albumin 2.7 (*)    AST 173 (*)    Total Bilirubin 1.9 (*)    All other components within normal limits  MAGNESIUM - Abnormal; Notable for the following components:   Magnesium 1.6 (*)    All other components within normal limits  URINALYSIS, ROUTINE W REFLEX MICROSCOPIC - Abnormal; Notable for the following components:   Color, Urine STRAW (*)    Glucose, UA 100 (*)    Hgb urine dipstick LARGE (*)    Bilirubin Urine MODERATE (*)    Protein, ur 30 (*)    All other components within normal limits  URINALYSIS, MICROSCOPIC (REFLEX) - Abnormal; Notable  for the following components:   Bacteria, UA MANY (*)    All other components within normal limits  CREATININE, SERUM - Abnormal; Notable for the following components:   Creatinine, Ser 0.33 (*)    All other components within normal limits  C-REACTIVE PROTEIN - Abnormal; Notable for the following components:   CRP 3.8 (*)    All other components within normal limits  CBC WITH DIFFERENTIAL/PLATELET - Abnormal; Notable for the following components:   Hemoglobin 11.4 (*)    HCT 34.9 (*)    RDW 21.0 (*)    All other components within normal limits  CBC WITH DIFFERENTIAL/PLATELET - Abnormal; Notable for the following components:   Hemoglobin 10.6 (*)    HCT 34.5 (*)    MCH 25.0 (*)    RDW 21.1 (*)    Platelets 449 (*)    All other components within normal limits  COMPREHENSIVE METABOLIC PANEL - Abnormal; Notable for the following components:   Glucose, Bld 115 (*)    BUN <5 (*)    Creatinine, Ser 0.32 (*)    Calcium 8.4 (*)    Albumin 2.8 (*)    AST 211 (*)    Total Bilirubin 1.6 (*)    All other components within normal limits  C-REACTIVE PROTEIN - Abnormal; Notable for the following components:   CRP 2.7 (*)    All other components within normal limits  POTASSIUM - Abnormal; Notable for the following components:   Potassium 3.3 (*)    All other components within normal limits  MRSA NEXT GEN BY PCR, NASAL  PROCALCITONIN  D-DIMER, QUANTITATIVE  FERRITIN  D-DIMER, QUANTITATIVE  FERRITIN  MAGNESIUM  MAGNESIUM  CBC  CBC WITH DIFFERENTIAL/PLATELET  COMPREHENSIVE METABOLIC PANEL  C-REACTIVE PROTEIN  D-DIMER, QUANTITATIVE  FERRITIN    EKG EKG Interpretation  Date/Time:  Wednesday April 12 2021 01:02:34 EST Ventricular Rate:  107 PR Interval:  131 QRS Duration: 89 QT Interval:  346 QTC Calculation: 462  R Axis:   13 Text Interpretation: Sinus tachycardia Low voltage, precordial leads Consider anterior infarct Borderline T abnormalities, inferior leads Confirmed by  Orpah Greek [phone]) on 04/12/2021 1:09:06 AM  Radiology CT Angio Chest Pulmonary Embolism (PE) W or WO Contrast  Result Date: 04/12/2021 CLINICAL DATA:  Concern for pulmonary embolism. EXAM: CT ANGIOGRAPHY CHEST WITH CONTRAST TECHNIQUE: Multidetector CT imaging of the chest was performed using the standard protocol during bolus administration of intravenous contrast. Multiplanar CT image reconstructions and MIPs were obtained to evaluate the vascular anatomy. RADIATION DOSE REDUCTION: This exam was performed according to the departmental dose-optimization program which includes automated exposure control, adjustment of the mA and/or kV according to patient size and/or use of iterative reconstruction technique. CONTRAST:  190mL OMNIPAQUE IOHEXOL 350 MG/ML SOLN COMPARISON:  CT dated 11/03/2020 and chest radiograph dated 04/12/2021. FINDINGS: Evaluation of this exam is limited due to respiratory motion artifact. Cardiovascular: Top-normal cardiac size. No pericardial effusion. The thoracic aorta is unremarkable. Evaluation of the pulmonary arteries is very limited due to severe respiratory motion artifact. No large or central pulmonary artery embolus identified. Mediastinum/Nodes: No hilar or mediastinal adenopathy. The esophagus is grossly unremarkable. No mediastinal fluid collection. Lungs/Pleura: Bilateral patchy airspace opacities most concerning for multilobar pneumonia. No pleural effusion pneumothorax. The central airways are patent. Upper Abdomen: Severe fatty liver. Musculoskeletal: No chest wall abnormality. No acute or significant osseous findings. Review of the MIP images confirms the above findings. IMPRESSION: 1. No CT evidence of central pulmonary artery embolus. 2. Multifocal pneumonia. Clinical correlation and follow-up to resolution recommended. 3. Severe fatty liver. Electronically Signed   By: Anner Crete M.D.   On: 04/12/2021 02:21   DG Chest Port 1 View  Result Date:  04/12/2021 CLINICAL DATA:  Shortness of breath. EXAM: PORTABLE CHEST 1 VIEW COMPARISON:  Chest radiograph dated 01/02/2021. FINDINGS: Shallow inspiration. There is mild cardiomegaly with mild vascular congestion. Left upper lobe streaky densities may represent vascular congestion. Developing infiltrate is not excluded. Clinical correlation is recommended. No focal consolidation, pleural effusion, or pneumothorax. No acute osseous pathology. IMPRESSION: 1. Mild cardiomegaly with mild vascular congestion. 2. Left upper lobe streaky densities may represent vascular congestion versus developing infiltrate. Electronically Signed   By: Anner Crete M.D.   On: 04/12/2021 00:38    Procedures Procedures    Medications Ordered in ED Medications  potassium chloride 10 mEq in 100 mL IVPB (0 mEq Intravenous Stopped 04/12/21 0649)  buPROPion (WELLBUTRIN SR) 12 hr tablet 150 mg (150 mg Oral Given 04/13/21 1136)  busPIRone (BUSPAR) tablet 5 mg (5 mg Oral Given 04/13/21 1135)  pantoprazole (PROTONIX) EC tablet 40 mg (40 mg Oral Given 04/13/21 1713)  senna-docusate (Senokot-S) tablet 1 tablet (1 tablet Oral Patient Refused/Not Given [id number])  folic acid (FOLVITE) tablet 1 mg (1 mg Oral Given 04/13/21 1137)  melatonin tablet 9 mg (9 mg Oral Given 04/12/21 2143)  divalproex (DEPAKOTE SPRINKLE) capsule 125 mg (125 mg Oral Given 04/13/21 1136)  pregabalin (LYRICA) capsule 75 mg (75 mg Oral Given 04/13/21 1713)  lidocaine (LIDODERM) 5 % 1 patch (1 patch Transdermal Patch Applied 04/13/21 1145)  methocarbamol (ROBAXIN) tablet 500 mg (has no administration in time range)  metoprolol tartrate (LOPRESSOR) tablet 12.5 mg (12.5 mg Oral Given 04/13/21 1136)  sorbitol, milk of mag, mineral oil, glycerin (SMOG) enema (960 mLs Rectal Patient Refused/Not Given 04/12/21 1520)  oxyCODONE (Oxy IR/ROXICODONE) immediate release tablet 5 mg (5 mg Oral Given 04/13/21 1221)  methylPREDNISolone sodium succinate (SOLU-MEDROL)  125 mg/2 mL  injection 92.5 mg (92.5 mg Intravenous Given 04/13/21 1158)    Followed by  predniSONE (DELTASONE) tablet 50 mg (has no administration in time range)  ondansetron (ZOFRAN) tablet 4 mg ( Oral See Alternative 04/12/21 1248)    Or  ondansetron (ZOFRAN) injection 4 mg (4 mg Intravenous Given 04/12/21 1248)  guaiFENesin-dextromethorphan (ROBITUSSIN DM) 100-10 MG/5ML syrup 10 mL (10 mLs Oral Given 04/13/21 1221)  acetaminophen (TYLENOL) tablet 650 mg (has no administration in time range)  potassium chloride (KLOR-CON) packet 40 mEq (40 mEq Oral Given 04/13/21 1146)  remdesivir 100 mg in sodium chloride 0.9 % 100 mL IVPB (100 mg Intravenous New Bag/Given 04/12/21 1357)    Followed by  remdesivir 100 mg in sodium chloride 0.9 % 100 mL IVPB (100 mg Intravenous New Bag/Given 04/13/21 1221)  enoxaparin (LOVENOX) injection 90 mg (90 mg Subcutaneous Given 04/13/21 1135)  chlorpheniramine-HYDROcodone 10-8 MG/5ML suspension 5 mL (has no administration in time range)  polyethylene glycol (MIRALAX / GLYCOLAX) packet 17 g (17 g Oral Not Given 04/13/21 1145)  bisacodyl (DULCOLAX) suppository 10 mg (10 mg Rectal Patient Refused/Not Given 04/12/21 1825)  albuterol (VENTOLIN HFA) 108 (90 Base) MCG/ACT inhaler 1 puff (has no administration in time range)  albuterol (VENTOLIN HFA) 108 (90 Base) MCG/ACT inhaler 2 puff (has no administration in time range)  hydrocortisone (ANUSOL-HC) suppository 25 mg (25 mg Rectal Given 04/13/21 1458)  lidocaine (XYLOCAINE) 5 % ointment (has no administration in time range)  lactated ringers bolus 1,000 mL (0 mLs Intravenous Stopped 04/12/21 0148)  promethazine (PHENERGAN) 25 mg in sodium chloride 0.9 % 50 mL IVPB (0 mg Intravenous Stopped 04/11/21 2356)  magnesium sulfate IVPB 2 g 50 mL (0 g Intravenous Stopped 04/12/21 0204)  iohexol (OMNIPAQUE) 350 MG/ML injection 100 mL (100 mLs Intravenous Contrast Given 04/12/21 0158)  ondansetron (ZOFRAN) injection 4 mg (4 mg Intravenous Given 04/12/21 0139)   dexamethasone (DECADRON) injection 10 mg (10 mg Intravenous Given 04/12/21 0345)    ED Course/ Medical Decision Making/ A&P                           Medical Decision Making Amount and/or Complexity of Data Reviewed Labs: ordered. Radiology: ordered.  Risk Prescription drug management. Decision regarding hospitalization.   This patient presents to the ED for concern of shortness of breath and hypoxia, this involves an extensive number of treatment options, and is a complaint that carries with it a high risk of complications and morbidity.  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  The pertinent results include: Lab work pending at time of signout   Cardiac Monitoring:  The patient was maintained on a cardiac monitor.  I personally viewed and interpreted the cardiac monitored which showed an underlying rhythm of: Sinus rhythm   Medicines ordered and prescription drug management:  I ordered medication including IV fluids for p.o. intolerance Reevaluation of the patient after these medicines showed that the patient improved I have reviewed the patients home medicines and have made adjustments as needed   Problem List / ED Course:  Patient is a 31 year old female who unfortunately had Guillain-Barr syndrome last  fall and continues to have chronic lower extremity weakness from this.  She does reside in a rehab facility.  She was diagnosed with COVID-19 2 days ago and reports that she has shortness of breath and hypoxia today.  She also has had ongoing issues with nausea, vomiting, and p.o.  intolerance, which she states have continued.  On arrival in the ED, she was tachycardic.  IV fluids were given for dehydration secondary to p.o. intolerance.  Patient's breathing is mildly labored.  This is likely multifactorial given habitus/pickwickian syndrome and COVID-19.  While on room air, SPO2 would fluctuate between 85% to 95%.  She was encouraged to take intermittent deep breaths to avoid atelectasis.  Patient's lab work was delayed due to difficulty obtaining IV access.  I did obtain left AC access under ultrasound guidance.  At this time, care of patient was signed out to oncoming ED provider.   Reevaluation:  After the interventions noted above, I reevaluated the patient and found that they have :improved   Social Determinants of Health:  Despite her young age, patient unfortunately requires skilled nursing care at this time due to recent Guillain-Barr syndrome.  Immobility puts her at risk for developing worsening chronic conditions.   Dispostion:  After consideration of the diagnostic results and the patients response to treatment, I feel that the patent would benefit from reassessment.          Final Clinical Impression(s) / ED Diagnoses Final diagnoses:  COVID-19  Acute respiratory failure with hypoxia [facility]    Rx / DC Orders ED Discharge Orders     None         Godfrey Pick, MD 04/13/21 [phone]

## 2021-04-11 NOTE — ED Triage Notes (Addendum)
[facility] . COVID + on the 15th. Cc of hypoxia. At [facility] . Pt is 93% on 2l. Intermittently wears it at the facility.   Pt also c/o chronic leg pain and nausea. States that she has been having trouble keeping food  down. When she was here the other day they were giving her zofran and she did it did not help that she needs phenergan and compazine.

## 2021-04-11 NOTE — ED Notes (Addendum)
Multiple attempts to obtain IV access made, this RN tried with ultrasound with no success. EDP made aware.

## 2021-04-12 ENCOUNTER — Institutional Professional Consult (permissible substitution): Payer: Self-pay | Admitting: Pulmonary Disease

## 2021-04-12 ENCOUNTER — Emergency Department (HOSPITAL_COMMUNITY): Payer: Medicaid Other

## 2021-04-12 DIAGNOSIS — Z8669 Personal history of other diseases of the nervous system and sense organs: Secondary | ICD-10-CM | POA: Diagnosis not present

## 2021-04-12 DIAGNOSIS — G610 Guillain-Barre syndrome: Secondary | ICD-10-CM

## 2021-04-12 DIAGNOSIS — F32A Depression, unspecified: Secondary | ICD-10-CM | POA: Diagnosis present

## 2021-04-12 DIAGNOSIS — E876 Hypokalemia: Secondary | ICD-10-CM

## 2021-04-12 DIAGNOSIS — K7581 Nonalcoholic steatohepatitis (NASH): Secondary | ICD-10-CM | POA: Diagnosis present

## 2021-04-12 DIAGNOSIS — L89322 Pressure ulcer of left buttock, stage 2: Secondary | ICD-10-CM | POA: Diagnosis present

## 2021-04-12 DIAGNOSIS — K5900 Constipation, unspecified: Secondary | ICD-10-CM | POA: Diagnosis present

## 2021-04-12 DIAGNOSIS — L89312 Pressure ulcer of right buttock, stage 2: Secondary | ICD-10-CM | POA: Diagnosis present

## 2021-04-12 DIAGNOSIS — J1282 Pneumonia due to coronavirus disease 2019: Secondary | ICD-10-CM

## 2021-04-12 DIAGNOSIS — Z6841 Body Mass Index (BMI) 40.0 and over, adult: Secondary | ICD-10-CM | POA: Diagnosis not present

## 2021-04-12 DIAGNOSIS — Z881 Allergy status to other antibiotic agents status: Secondary | ICD-10-CM | POA: Diagnosis not present

## 2021-04-12 DIAGNOSIS — N390 Urinary tract infection, site not specified: Secondary | ICD-10-CM | POA: Diagnosis present

## 2021-04-12 DIAGNOSIS — U071 COVID-19: Principal | ICD-10-CM

## 2021-04-12 DIAGNOSIS — K219 Gastro-esophageal reflux disease without esophagitis: Secondary | ICD-10-CM | POA: Diagnosis present

## 2021-04-12 DIAGNOSIS — E860 Dehydration: Secondary | ICD-10-CM | POA: Diagnosis present

## 2021-04-12 DIAGNOSIS — Z79899 Other long term (current) drug therapy: Secondary | ICD-10-CM | POA: Diagnosis not present

## 2021-04-12 DIAGNOSIS — J9601 Acute respiratory failure with hypoxia: Secondary | ICD-10-CM | POA: Diagnosis present

## 2021-04-12 DIAGNOSIS — F419 Anxiety disorder, unspecified: Secondary | ICD-10-CM | POA: Diagnosis present

## 2021-04-12 DIAGNOSIS — R112 Nausea with vomiting, unspecified: Secondary | ICD-10-CM | POA: Diagnosis present

## 2021-04-12 LAB — URINALYSIS, ROUTINE W REFLEX MICROSCOPIC
Glucose, UA: 100 mg/dL — AB
Ketones, ur: NEGATIVE mg/dL
Leukocytes,Ua: NEGATIVE
Nitrite: NEGATIVE
Protein, ur: 30 mg/dL — AB
Specific Gravity, Urine: 1.02 (ref 1.005–1.030)
pH: 6.5 (ref 5.0–8.0)

## 2021-04-12 LAB — CBC WITH DIFFERENTIAL/PLATELET
Abs Immature Granulocytes: 0.03 10*3/uL (ref 0.00–0.07)
Basophils Absolute: 0 10*3/uL (ref 0.0–0.1)
Basophils Relative: 0 %
Eosinophils Absolute: 0 10*3/uL (ref 0.0–0.5)
Eosinophils Relative: 0 %
HCT: 34.9 % — ABNORMAL LOW (ref 36.0–46.0)
Hemoglobin: 11.4 g/dL — ABNORMAL LOW (ref 12.0–15.0)
Immature Granulocytes: 1 %
Lymphocytes Relative: 23 %
Lymphs Abs: 1.2 10*3/uL (ref 0.7–4.0)
MCH: 27.2 pg (ref 26.0–34.0)
MCHC: 32.7 g/dL (ref 30.0–36.0)
MCV: 83.3 fL (ref 80.0–100.0)
Monocytes Absolute: 0.2 10*3/uL (ref 0.1–1.0)
Monocytes Relative: 3 %
Neutro Abs: 3.7 10*3/uL (ref 1.7–7.7)
Neutrophils Relative %: 73 %
Platelets: 374 10*3/uL (ref 150–400)
RBC: 4.19 MIL/uL (ref 3.87–5.11)
RDW: 21 % — ABNORMAL HIGH (ref 11.5–15.5)
WBC: 5.1 10*3/uL (ref 4.0–10.5)
nRBC: 0 % (ref 0.0–0.2)

## 2021-04-12 LAB — FERRITIN: Ferritin: 32 ng/mL (ref 11–307)

## 2021-04-12 LAB — COMPREHENSIVE METABOLIC PANEL
ALT: 37 U/L (ref 0–44)
AST: 173 U/L — ABNORMAL HIGH (ref 15–41)
Albumin: 2.7 g/dL — ABNORMAL LOW (ref 3.5–5.0)
Alkaline Phosphatase: 78 U/L (ref 38–126)
Anion gap: 10 (ref 5–15)
BUN: <5 mg/dL — ABNORMAL LOW (ref 6–20)
CO2: 26 mmol/L (ref 22–32)
Calcium: 8 mg/dL — ABNORMAL LOW (ref 8.9–10.3)
Chloride: 102 mmol/L (ref 98–111)
Creatinine, Ser: <0.3 mg/dL — ABNORMAL LOW (ref 0.44–1.00)
Glucose, Bld: 77 mg/dL (ref 70–99)
Potassium: 2.5 mmol/L — CL (ref 3.5–5.1)
Sodium: 138 mmol/L (ref 135–145)
Total Bilirubin: 1.9 mg/dL — ABNORMAL HIGH (ref 0.3–1.2)
Total Protein: 6.8 g/dL (ref 6.5–8.1)

## 2021-04-12 LAB — MAGNESIUM
Magnesium: 1.6 mg/dL — ABNORMAL LOW (ref 1.7–2.4)
Magnesium: 1.9 mg/dL (ref 1.7–2.4)

## 2021-04-12 LAB — URINALYSIS, MICROSCOPIC (REFLEX): RBC / HPF: >50 RBC/hpf (ref 0–5)

## 2021-04-12 LAB — D-DIMER, QUANTITATIVE: D-Dimer, Quant: 0.39 ug/mL-FEU (ref 0.00–0.50)

## 2021-04-12 LAB — POTASSIUM: Potassium: 3.3 mmol/L — ABNORMAL LOW (ref 3.5–5.1)

## 2021-04-12 LAB — CREATININE, SERUM
Creatinine, Ser: 0.33 mg/dL — ABNORMAL LOW (ref 0.44–1.00)
GFR, Estimated: >60 mL/min (ref >60)

## 2021-04-12 LAB — PROCALCITONIN: Procalcitonin: <0.1 ng/mL

## 2021-04-12 LAB — C-REACTIVE PROTEIN: CRP: 3.8 mg/dL — ABNORMAL HIGH (ref <1.0)

## 2021-04-12 MED ORDER — OXYCODONE HCL 5 MG PO TABS
5.0000 mg | ORAL_TABLET | Freq: Three times a day (TID) | ORAL | Status: DC | PRN
  Administered 2021-04-12 – 2021-04-16 (×10): 5 mg via ORAL
  Filled 2021-04-12 (×10): qty 1

## 2021-04-12 MED ORDER — ONDANSETRON HCL 4 MG/2ML IJ SOLN
4.0000 mg | Freq: Once | INTRAMUSCULAR | Status: AC
  Administered 2021-04-12: 4 mg via INTRAVENOUS
  Filled 2021-04-12: qty 2

## 2021-04-12 MED ORDER — ALBUTEROL SULFATE HFA 108 (90 BASE) MCG/ACT IN AERS
2.0000 | INHALATION_SPRAY | Freq: Four times a day (QID) | RESPIRATORY_TRACT | Status: DC
  Administered 2021-04-12 (×2): 2 via RESPIRATORY_TRACT
  Filled 2021-04-12: qty 6.7

## 2021-04-12 MED ORDER — ONDANSETRON HCL 4 MG/2ML IJ SOLN
4.0000 mg | Freq: Four times a day (QID) | INTRAMUSCULAR | Status: DC | PRN
  Administered 2021-04-12 – 2021-04-17 (×5): 4 mg via INTRAVENOUS
  Filled 2021-04-12 (×5): qty 2

## 2021-04-12 MED ORDER — ALBUTEROL SULFATE HFA 108 (90 BASE) MCG/ACT IN AERS
2.0000 | INHALATION_SPRAY | Freq: Three times a day (TID) | RESPIRATORY_TRACT | Status: DC
  Administered 2021-04-13: 2 via RESPIRATORY_TRACT

## 2021-04-12 MED ORDER — METHYLPREDNISOLONE SODIUM SUCC 125 MG IJ SOLR
0.5000 mg/kg | Freq: Two times a day (BID) | INTRAMUSCULAR | Status: AC
  Administered 2021-04-12 – 2021-04-15 (×6): 92.5 mg via INTRAVENOUS
  Filled 2021-04-12 (×6): qty 2

## 2021-04-12 MED ORDER — METHOCARBAMOL 500 MG PO TABS: 500.0000 mg | ORAL_TABLET | Freq: Four times a day (QID) | ORAL | Status: DC | PRN

## 2021-04-12 MED ORDER — SENNOSIDES-DOCUSATE SODIUM 8.6-50 MG PO TABS
1.0000 | ORAL_TABLET | Freq: Two times a day (BID) | ORAL | Status: DC
  Administered 2021-04-12 – 2021-04-13 (×2): 1 via ORAL
  Filled 2021-04-12 (×9): qty 1

## 2021-04-12 MED ORDER — SODIUM CHLORIDE 0.9 % IV SOLN: 100.0000 mg | Freq: Every day | INTRAVENOUS | Status: DC

## 2021-04-12 MED ORDER — DEXAMETHASONE SODIUM PHOSPHATE 10 MG/ML IJ SOLN
10.0000 mg | Freq: Once | INTRAMUSCULAR | Status: AC
Start: 2021-04-12 — End: 2021-04-12
  Administered 2021-04-12: 10 mg via INTRAVENOUS
  Filled 2021-04-12: qty 1

## 2021-04-12 MED ORDER — BISACODYL 10 MG RE SUPP
10.0000 mg | Freq: Once | RECTAL | Status: DC
  Filled 2021-04-12: qty 1

## 2021-04-12 MED ORDER — ENOXAPARIN SODIUM 100 MG/ML IJ SOSY: 90.0000 mg | PREFILLED_SYRINGE | Freq: Every day | INTRAMUSCULAR | Status: DC

## 2021-04-12 MED ORDER — SORBITOL 70 % SOLN
960.0000 mL | TOPICAL_OIL | Freq: Once | ORAL | Status: DC
  Filled 2021-04-12: qty 473

## 2021-04-12 MED ORDER — NIRMATRELVIR/RITONAVIR (PAXLOVID)TABLET
3.0000 | ORAL_TABLET | Freq: Two times a day (BID) | ORAL | Status: DC
  Filled 2021-04-12 (×2): qty 30

## 2021-04-12 MED ORDER — ENOXAPARIN SODIUM 100 MG/ML IJ SOSY
100.0000 mg | PREFILLED_SYRINGE | Freq: Every day | INTRAMUSCULAR | Status: DC
  Filled 2021-04-12: qty 1

## 2021-04-12 MED ORDER — IOHEXOL 350 MG/ML SOLN
100.0000 mL | Freq: Once | INTRAVENOUS | Status: AC | PRN
  Administered 2021-04-12: 100 mL via INTRAVENOUS

## 2021-04-12 MED ORDER — GUAIFENESIN-DM 100-10 MG/5ML PO SYRP
10.0000 mL | ORAL_SOLUTION | ORAL | Status: DC | PRN
  Administered 2021-04-13 – 2021-04-16 (×5): 10 mL via ORAL
  Filled 2021-04-12 (×5): qty 10

## 2021-04-12 MED ORDER — POTASSIUM CHLORIDE CRYS ER 20 MEQ PO TBCR
40.0000 meq | EXTENDED_RELEASE_TABLET | Freq: Once | ORAL | Status: DC
  Filled 2021-04-12: qty 2

## 2021-04-12 MED ORDER — SODIUM CHLORIDE 0.9 % IV SOLN
100.0000 mg | Freq: Every day | INTRAVENOUS | Status: DC
  Administered 2021-04-13 – 2021-04-15 (×3): 100 mg via INTRAVENOUS
  Filled 2021-04-12 (×3): qty 20

## 2021-04-12 MED ORDER — DIVALPROEX SODIUM 125 MG PO CSDR
125.0000 mg | DELAYED_RELEASE_CAPSULE | Freq: Two times a day (BID) | ORAL | Status: DC
  Administered 2021-04-12 – 2021-04-17 (×10): 125 mg via ORAL
  Filled 2021-04-12 (×11): qty 1

## 2021-04-12 MED ORDER — ENOXAPARIN SODIUM 100 MG/ML IJ SOSY
90.0000 mg | PREFILLED_SYRINGE | Freq: Every day | INTRAMUSCULAR | Status: DC
  Administered 2021-04-12 – 2021-04-15 (×4): 90 mg via SUBCUTANEOUS
  Filled 2021-04-12 (×4): qty 1

## 2021-04-12 MED ORDER — PREGABALIN 75 MG PO CAPS
75.0000 mg | ORAL_CAPSULE | Freq: Three times a day (TID) | ORAL | Status: DC
  Administered 2021-04-12 – 2021-04-17 (×15): 75 mg via ORAL
  Filled 2021-04-12 (×15): qty 1

## 2021-04-12 MED ORDER — BUSPIRONE HCL 5 MG PO TABS
5.0000 mg | ORAL_TABLET | Freq: Two times a day (BID) | ORAL | Status: DC
  Administered 2021-04-12 – 2021-04-17 (×10): 5 mg via ORAL
  Filled 2021-04-12 (×11): qty 1

## 2021-04-12 MED ORDER — LIDOCAINE 5 % EX PTCH
1.0000 | MEDICATED_PATCH | Freq: Every day | CUTANEOUS | Status: DC
  Administered 2021-04-12 – 2021-04-17 (×6): 1 via TRANSDERMAL
  Filled 2021-04-12 (×6): qty 1

## 2021-04-12 MED ORDER — FOLIC ACID 1 MG PO TABS
1.0000 mg | ORAL_TABLET | Freq: Every day | ORAL | Status: DC
  Administered 2021-04-13 – 2021-04-17 (×5): 1 mg via ORAL
  Filled 2021-04-12 (×6): qty 1

## 2021-04-12 MED ORDER — HYDROCOD POLI-CHLORPHE POLI ER 10-8 MG/5ML PO SUER: 5.0000 mL | Freq: Two times a day (BID) | ORAL | Status: DC | PRN

## 2021-04-12 MED ORDER — POLYETHYLENE GLYCOL 3350 17 G PO PACK
17.0000 g | PACK | Freq: Every day | ORAL | Status: DC
  Filled 2021-04-12: qty 1

## 2021-04-12 MED ORDER — PANTOPRAZOLE SODIUM 40 MG PO TBEC
40.0000 mg | DELAYED_RELEASE_TABLET | Freq: Two times a day (BID) | ORAL | Status: DC
  Administered 2021-04-12 – 2021-04-17 (×10): 40 mg via ORAL
  Filled 2021-04-12 (×10): qty 1

## 2021-04-12 MED ORDER — MAGNESIUM SULFATE 2 GM/50ML IV SOLN
2.0000 g | Freq: Once | INTRAVENOUS | Status: AC
  Administered 2021-04-12: 2 g via INTRAVENOUS
  Filled 2021-04-12: qty 50

## 2021-04-12 MED ORDER — POTASSIUM CHLORIDE 10 MEQ/100ML IV SOLN
10.0000 meq | INTRAVENOUS | Status: AC
  Administered 2021-04-12 (×3): 10 meq via INTRAVENOUS
  Filled 2021-04-12 (×3): qty 100

## 2021-04-12 MED ORDER — POLYETHYLENE GLYCOL 3350 17 G PO PACK
17.0000 g | PACK | Freq: Two times a day (BID) | ORAL | Status: DC
  Administered 2021-04-12 – 2021-04-17 (×5): 17 g via ORAL
  Filled 2021-04-12 (×8): qty 1

## 2021-04-12 MED ORDER — POTASSIUM CHLORIDE 20 MEQ PO PACK
40.0000 meq | PACK | Freq: Two times a day (BID) | ORAL | Status: DC
  Administered 2021-04-12 – 2021-04-17 (×11): 40 meq via ORAL
  Filled 2021-04-12 (×12): qty 2

## 2021-04-12 MED ORDER — MELATONIN 3 MG PO TABS
9.0000 mg | ORAL_TABLET | Freq: Every day | ORAL | Status: DC
  Administered 2021-04-12 – 2021-04-16 (×5): 9 mg via ORAL
  Filled 2021-04-12 (×5): qty 3

## 2021-04-12 MED ORDER — METOPROLOL TARTRATE 25 MG PO TABS
12.5000 mg | ORAL_TABLET | Freq: Two times a day (BID) | ORAL | Status: DC
  Administered 2021-04-12 – 2021-04-17 (×9): 12.5 mg via ORAL
  Filled 2021-04-12 (×11): qty 1

## 2021-04-12 MED ORDER — ONDANSETRON HCL 4 MG PO TABS: 4.0000 mg | ORAL_TABLET | Freq: Four times a day (QID) | ORAL | Status: DC | PRN

## 2021-04-12 MED ORDER — BUPROPION HCL ER (SR) 150 MG PO TB12
150.0000 mg | ORAL_TABLET | Freq: Every day | ORAL | Status: DC
  Administered 2021-04-13 – 2021-04-17 (×5): 150 mg via ORAL
  Filled 2021-04-12 (×6): qty 1

## 2021-04-12 MED ORDER — HYDROCOD POLST-CPM POLST ER 10-8 MG/5ML PO SUER: 5.0000 mL | Freq: Two times a day (BID) | ORAL | Status: DC | PRN

## 2021-04-12 MED ORDER — SODIUM CHLORIDE 0.9 % IV SOLN
100.0000 mg | INTRAVENOUS | Status: AC
  Administered 2021-04-12 (×2): 100 mg via INTRAVENOUS
  Filled 2021-04-12 (×2): qty 20

## 2021-04-12 MED ORDER — SODIUM CHLORIDE 0.9 % IV SOLN: 200.0000 mg | Freq: Once | INTRAVENOUS | Status: DC

## 2021-04-12 MED ORDER — ACETAMINOPHEN 325 MG PO TABS: 650.0000 mg | ORAL_TABLET | Freq: Four times a day (QID) | ORAL | Status: DC | PRN

## 2021-04-12 MED ORDER — PREDNISONE 20 MG PO TABS
50.0000 mg | ORAL_TABLET | Freq: Every day | ORAL | Status: DC
  Administered 2021-04-15: 50 mg via ORAL
  Filled 2021-04-12: qty 1

## 2021-04-12 NOTE — ED Notes (Signed)
Patient transported to CT 

## 2021-04-12 NOTE — Progress Notes (Signed)
Attempted to do SMOG enema, had resistance. Patient only able to hold 20-30 mL of enema. Patient stated she is unable to handle enema. MD Memon made aware. New orders placed.

## 2021-04-12 NOTE — Progress Notes (Deleted)
History and Physical    Carla Little [id number] DOB: 1990-05-22 DOA: 04/11/2021  PCP: Patient, No Pcp Per (Inactive)  Patient coming from: Skilled nursing facility  I have personally briefly reviewed patient's old medical records in [place]  Chief Complaint: Shortness of breath, hypoxia  HPI: Carla Little is a 31 y.o. female with medical history significant of recent prolonged hospitalization where she was treated for left knee dislocation.  She also developed Guillain-Barr syndrome and was treated with IVIG.  Due to persistent weakness, she was discharged to skilled nursing facility for rehab.  She reports that she is in but has made slow progress and is now able to sit up on the side of the bed.  She is not able to stand as of yet.  She reports that she has had nausea, vomiting and cough for the past week.  Since yesterday, she had increasing shortness of breath.  She did not have any fever.  When checked yesterday, she was noted to be hypoxic.  She reports having difficulty keeping anything down.  She reports being diagnosed with COVID-19 approximately 1 week ago.  She was seen in the ER at that time and discharged back to skilled facility after being treated supportively with IV fluids and antiemetics.  ED Course: Noted to be hypoxic on room air, down to 85% and started on supplemental oxygen.  Her vitals were otherwise stable.  CT of the chest was negative for pulmonary embolus, but did note multifocal pneumonia.  She was also noted to be significantly hypokalemic and hypomagnesemic.  Review of Systems: As per HPI otherwise 10 point review of systems negative.    Past Medical History:  Diagnosis Date   Class 3 obesity ([place]) 12/09/2020   Depression    GERD (gastroesophageal reflux disease)    Guillain Barr syndrome (HCC)    Nonalcoholic steatohepatitis (NASH) 12/09/2020   Obesity    PUD (peptic ulcer disease) 12/09/2020    Past Surgical History:  Procedure  Laterality Date   BIOPSY  11/19/2020   Procedure: BIOPSY;  Surgeon: Juanita Craver, MD;  Location: [facility];  Service: Endoscopy;;   ESOPHAGOGASTRODUODENOSCOPY N/A 01/10/2021   Procedure: ESOPHAGOGASTRODUODENOSCOPY (EGD);  Surgeon: Otis Brace, MD;  Location: [facility];  Service: Gastroenterology;  Laterality: N/A;   ESOPHAGOGASTRODUODENOSCOPY (EGD) WITH PROPOFOL N/A 11/19/2020   Procedure: ESOPHAGOGASTRODUODENOSCOPY (EGD) WITH PROPOFOL;  Surgeon: Juanita Craver, MD;  Location: [facility];  Service: Endoscopy;  Laterality: N/A;   FLEXIBLE SIGMOIDOSCOPY N/A 01/10/2021   Procedure: FLEXIBLE SIGMOIDOSCOPY;  Surgeon: Otis Brace, MD;  Location: [facility];  Service: Gastroenterology;  Laterality: N/A;    Social History:  reports that she has never smoked. She has never used smokeless tobacco. She reports current alcohol use. She reports that she does not use drugs.  Allergies  Allergen Reactions   Azithromycin Shortness Of Breath and Nausea And Vomiting    Family History  Problem Relation Age of Onset   Hypertension Other      Prior to Admission medications   Medication Sig Start Date End Date Taking? Authorizing Provider  acetaminophen (TYLENOL) 325 MG tablet Take 2 tablets (650 mg total) by mouth every 6 (six) hours as needed for mild pain (or Fever >/= 101). 01/26/21  Yes Jennye Boroughs, MD  buPROPion [facility] SR) 150 MG 12 hr tablet Take 150 mg by mouth daily.   Yes [provider]  busPIRone (BUSPAR) 5 MG tablet Take 1 tablet (5 mg total) by mouth 2 (two) times  daily. 01/26/21  Yes Jennye Boroughs, MD  cholecalciferol (VITAMIN D) 25 MCG tablet Take 2 tablets (2,000 Units total) by mouth daily. 11/28/20  Yes Shawna Clamp, MD  cyanocobalamin (,VITAMIN B-12,) 1000 MCG/ML injection Inject 1 mL (1,000 mcg total) into the muscle every 30 (thirty) days. 01/29/21  Yes Jennye Boroughs, MD  diphenhydrAMINE (BENADRYL) 25 MG tablet Take 25 mg by mouth every 8 (eight)  hours as needed for itching.   Yes [provider]  divalproex (DEPAKOTE SPRINKLE) 125 MG capsule Take 125 mg by mouth 2 (two) times daily.   Yes [provider]  folic acid (FOLVITE) 1 MG tablet Take 1 tablet (1 mg total) by mouth daily. 01/27/21  Yes Jennye Boroughs, MD  hydrocortisone cream 1 % Apply 1 application topically 3 (three) times daily.   Yes [provider]  lidocaine (LIDODERM) 5 % Place 1 patch onto the skin daily. Remove & Discard patch within 12 hours or as directed by MD 01/27/21  Yes Jennye Boroughs, MD  melatonin 5 MG TABS Take 10 mg by mouth at bedtime.   Yes [provider]  methocarbamol (ROBAXIN) 500 MG tablet Take 1 tablet (500 mg total) by mouth every 6 (six) hours as needed for muscle spasms. 11/28/20  Yes Shawna Clamp, MD  metoprolol tartrate (LOPRESSOR) 25 MG tablet Take 1 tablet (25 mg total) by mouth 2 (two) times daily. 01/26/21  Yes Jennye Boroughs, MD  omeprazole (PRILOSEC) 20 MG capsule Take 20 mg by mouth in the morning and at bedtime.   Yes [provider]  ondansetron (ZOFRAN-ODT) 4 MG disintegrating tablet Take 1 tablet (4 mg total) by mouth every 8 (eight) hours as needed for nausea or vomiting. 04/09/21  Yes Ali, Amjad, PA-C  oxyCODONE (OXY IR/ROXICODONE) 5 MG immediate release tablet Take 5 mg by mouth every 8 (eight) hours as needed for severe pain.   Yes [provider]  polyethylene glycol (MIRALAX / GLYCOLAX) 17 g packet Take 17 g by mouth daily. 01/27/21  Yes Jennye Boroughs, MD  pregabalin (LYRICA) 75 MG capsule Take 1 capsule (75 mg total) by mouth 3 (three) times daily. 01/26/21  Yes Jennye Boroughs, MD  senna-docusate (SENOKOT-S) 8.6-50 MG tablet Take 1 tablet by mouth at bedtime as needed for mild constipation. 01/26/21  Yes Jennye Boroughs, MD  buPROPion (WELLBUTRIN XL) 150 MG 24 hr tablet Take 1 tablet (150 mg total) by mouth daily. Patient not taking: Reported on 04/12/2021 01/27/21   Jennye Boroughs, MD   pantoprazole (PROTONIX) 40 MG tablet Take 1 tablet (40 mg total) by mouth 2 (two) times daily. Patient not taking: Reported on 04/12/2021 01/26/21   Jennye Boroughs, MD    Physical Exam: Vitals:   04/12/21 0907 04/12/21 1300 04/12/21 1517 04/12/21 1714  BP: 104/73 113/83  129/78  Pulse: (!) 102 (!) 107  99  Resp: 18 18  (!) 24  Temp: 98.8 F (37.1 C) 98 F (36.7 C)  98.3 F (36.8 C)  TempSrc: Oral Oral  Oral  SpO2: 96% 97% 96% 100%    Constitutional: NAD, calm, comfortable Eyes: PERRL, lids and conjunctivae normal ENMT: Mucous membranes are moist. Posterior pharynx clear of any exudate or lesions.Normal dentition.  Neck: normal, supple, no masses, no thyromegaly Respiratory: clear to auscultation bilaterally, no wheezing, no crackles. Normal respiratory effort. No accessory muscle use.  Cardiovascular: Regular rate and rhythm, no murmurs / rubs / gallops. No extremity edema. 2+ pedal pulses. No carotid bruits.  Abdomen: no tenderness, no  masses palpated. No hepatosplenomegaly. Bowel sounds positive.  Musculoskeletal: no clubbing / cyanosis. No joint deformity upper and lower extremities. Good ROM, no contractures. Normal muscle tone.  Skin: no rashes, lesions, ulcers. No induration Neurologic: CN 2-12 grossly intact.  She is globally weak, no focal deficits Psychiatric: Normal judgment and insight. Alert and oriented x 3. Normal mood.    Labs on Admission: I have personally reviewed following labs and imaging studies  CBC: Recent Labs  Lab 04/09/21 1517 04/11/21 2301 04/12/21 1207  WBC 7.6 8.1 5.1  NEUTROABS 4.2 4.7 3.7  HGB 12.0 11.2* 11.4*  HCT 37.4 35.0* 34.9*  MCV 83.5 83.1 83.3  PLT 359 369 [id number]   Basic Metabolic Panel: Recent Labs  Lab 04/09/21 1517 04/11/21 2301 04/12/21 1207 04/12/21 1446  NA 135 138  --   --   K 3.7 2.5*  --  3.3*  CL 98 102  --   --   CO2 28 26  --   --   GLUCOSE 90 77  --   --   BUN 5* <5*  --   --   CREATININE 0.48 <0.30* 0.33*  --    CALCIUM 8.2* 8.0*  --   --   MG  --  1.6*  --  1.9   GFR: Estimated Creatinine Clearance: 170.4 mL/min (A) (by C-G formula based on SCr of 0.33 mg/dL (L)). Liver Function Tests: Recent Labs  Lab 04/09/21 1517 04/11/21 2301  AST 228* 173*  ALT 49* 37  ALKPHOS 81 78  BILITOT 1.2 1.9*  PROT 7.3 6.8  ALBUMIN 2.9* 2.7*   Recent Labs  Lab 04/09/21 1517  LIPASE 24   No results for input(s): AMMONIA in the last 168 hours. Coagulation Profile: No results for input(s): INR, PROTIME in the last 168 hours. Cardiac Enzymes: No results for input(s): CKTOTAL, CKMB, CKMBINDEX, TROPONINI in the last 168 hours. BNP (last 3 results) No results for input(s): PROBNP in the last 8760 hours. HbA1C: No results for input(s): HGBA1C in the last 72 hours. CBG: No results for input(s): GLUCAP in the last 168 hours. Lipid Profile: No results for input(s): CHOL, HDL, LDLCALC, TRIG, CHOLHDL, LDLDIRECT in the last 72 hours. Thyroid Function Tests: No results for input(s): TSH, T4TOTAL, FREET4, T3FREE, THYROIDAB in the last 72 hours. Anemia Panel: Recent Labs    04/12/21 1207  FERRITIN 32   Urine analysis:    Component Value Date/Time   COLORURINE STRAW (A) 04/12/2021 0226   APPEARANCEUR CLEAR 04/12/2021 0226   LABSPEC 1.020 04/12/2021 0226   PHURINE 6.5 04/12/2021 0226   GLUCOSEU 100 (A) 04/12/2021 0226   HGBUR LARGE (A) 04/12/2021 0226   BILIRUBINUR MODERATE (A) 04/12/2021 0226   KETONESUR NEGATIVE 04/12/2021 0226   PROTEINUR 30 (A) 04/12/2021 0226   UROBILINOGEN 1.0 10/27/2013 2116   NITRITE NEGATIVE 04/12/2021 0226   LEUKOCYTESUR NEGATIVE 04/12/2021 0226    Radiological Exams on Admission: CT Angio Chest Pulmonary Embolism (PE) W or WO Contrast  Result Date: 04/12/2021 CLINICAL DATA:  Concern for pulmonary embolism. EXAM: CT ANGIOGRAPHY CHEST WITH CONTRAST TECHNIQUE: Multidetector CT imaging of the chest was performed using the standard protocol during bolus administration of  intravenous contrast. Multiplanar CT image reconstructions and MIPs were obtained to evaluate the vascular anatomy. RADIATION DOSE REDUCTION: This exam was performed according to the departmental dose-optimization program which includes automated exposure control, adjustment of the mA and/or kV according to patient size and/or use of iterative reconstruction technique. CONTRAST:  130mL OMNIPAQUE  IOHEXOL 350 MG/ML SOLN COMPARISON:  CT dated 11/03/2020 and chest radiograph dated 04/12/2021. FINDINGS: Evaluation of this exam is limited due to respiratory motion artifact. Cardiovascular: Top-normal cardiac size. No pericardial effusion. The thoracic aorta is unremarkable. Evaluation of the pulmonary arteries is very limited due to severe respiratory motion artifact. No large or central pulmonary artery embolus identified. Mediastinum/Nodes: No hilar or mediastinal adenopathy. The esophagus is grossly unremarkable. No mediastinal fluid collection. Lungs/Pleura: Bilateral patchy airspace opacities most concerning for multilobar pneumonia. No pleural effusion pneumothorax. The central airways are patent. Upper Abdomen: Severe fatty liver. Musculoskeletal: No chest wall abnormality. No acute or significant osseous findings. Review of the MIP images confirms the above findings. IMPRESSION: 1. No CT evidence of central pulmonary artery embolus. 2. Multifocal pneumonia. Clinical correlation and follow-up to resolution recommended. 3. Severe fatty liver. Electronically Signed   By: Elgie Collard M.D.   On: 04/12/2021 02:21   DG Chest Port 1 View  Result Date: 04/12/2021 CLINICAL DATA:  Shortness of breath. EXAM: PORTABLE CHEST 1 VIEW COMPARISON:  Chest radiograph dated 01/02/2021. FINDINGS: Shallow inspiration. There is mild cardiomegaly with mild vascular congestion. Left upper lobe streaky densities may represent vascular congestion. Developing infiltrate is not excluded. Clinical correlation is recommended. No focal  consolidation, pleural effusion, or pneumothorax. No acute osseous pathology. IMPRESSION: 1. Mild cardiomegaly with mild vascular congestion. 2. Left upper lobe streaky densities may represent vascular congestion versus developing infiltrate. Electronically Signed   By: Elgie Collard M.D.   On: 04/12/2021 00:38    EKG: Independently reviewed.  Sinus rhythm without acute ischemic changes  Assessment/Plan Principal Problem:   Pneumonia due to COVID-19 virus Active Problems:   Nonalcoholic steatohepatitis (NASH)   Class 3 obesity (HCC)   Hypokalemia   GERD (gastroesophageal reflux disease)   Depression   Guillain Barr syndrome (HCC)   Hypomagnesemia     Acute respiratory failure with hypoxia -Secondary to COVID-19 pneumonia -Currently on 3 L of oxygen -We will try and wean off as tolerated -Of note, during her last hospitalization, she was noted to have nocturnal hypoxia and was recommended to have outpatient sleep study  COVID-19 pneumonia -Since she would be considered high risk due to her comorbidities, will treat with IV remdesivir -Started on steroids since she is hypoxic -Continue albuterol and Mucinex -Follow inflammatory markers -Procalcitonin negative  Nausea and vomiting -She did have similar episodes during her last hospitalization, but reports improvement -Her current symptoms recurred over the past week -Treat supportively with antiemetics -We will keep on clear liquids for now  Constipation -Reports that she has not had a bowel movement in approximately a week -We will start on MiraLAX -Patient was offered enema/suppository, but she reports that she cannot tolerate this due to pain in her rectal area. -On her previous admission, she was noted to have proctitis.  Hypokalemia/hypomagnesemia -Related to vomiting and decreased p.o. intake -This is being replaced  Depression/anxiety -Continue outpatient dose of Wellbutrin and BuSpar -She is also on  Depakote  Morbid obesity -Increased risk of morbidity and mortality with her current illness.  Recent Guillain-Barr diagnosis -Continue physical therapy  DVT prophylaxis: Lovenox Code Status: Full code Family Communication: Patient wishes that her cousin, Merton Border be her power of attorney.  She did not feel was necessary for me to call Lyla Son for an update at this time Disposition Plan: Return to skilled nursing facility on discharge Consults called:   Admission status: Inpatient, telemetry  Erick Blinks MD [facility]  If 7PM-7AM, please contact night-coverage [email or web address]   04/12/2021, 7:51 PM

## 2021-04-12 NOTE — ED Notes (Signed)
Potassium of 2.5 reported to Dr.Pollina

## 2021-04-12 NOTE — Progress Notes (Signed)
Patient complained of nausea, given IV Zofran. Went to reassess patients nausea was decreased, noted patient eating lunch. Crushed patients PO meds attempted to give medications., patient took a sip of water then spit medication out. Patient stated she has not been able to tolerate taking PO meds. MD Memon made aware.

## 2021-04-12 NOTE — H&P (Signed)
History and Physical    Carla Little [id number] DOB: May 13, 1990 DOA: 04/11/2021  PCP: Patient, No Pcp Per (Inactive)  Patient coming from: Skilled nursing facility  I have personally briefly reviewed patient's old medical records in [place]  Chief Complaint: Shortness of breath, hypoxia  HPI: Carla Little is a 31 y.o. female with medical history significant of recent prolonged hospitalization where she was treated for left knee dislocation.  She also developed Guillain-Barr syndrome and was treated with IVIG.  Due to persistent weakness, she was discharged to skilled nursing facility for rehab.  She reports that she is in but has made slow progress and is now able to sit up on the side of the bed.  She is not able to stand as of yet.  She reports that she has had nausea, vomiting and cough for the past week.  Since yesterday, she had increasing shortness of breath.  She did not have any fever.  When checked yesterday, she was noted to be hypoxic.  She reports having difficulty keeping anything down.  She reports being diagnosed with COVID-19 approximately 1 week ago.  She was seen in the ER at that time and discharged back to skilled facility after being treated supportively with IV fluids and antiemetics.  ED Course: Noted to be hypoxic on room air, down to 85% and started on supplemental oxygen.  Her vitals were otherwise stable.  CT of the chest was negative for pulmonary embolus, but did note multifocal pneumonia.  She was also noted to be significantly hypokalemic and hypomagnesemic.  Review of Systems: As per HPI otherwise 10 point review of systems negative.    Past Medical History:  Diagnosis Date   Class 3 obesity ([place]) 12/09/2020   Depression    GERD (gastroesophageal reflux disease)    Guillain Barr syndrome (HCC)    Nonalcoholic steatohepatitis (NASH) 12/09/2020   Obesity    PUD (peptic ulcer disease) 12/09/2020    Past Surgical History:  Procedure  Laterality Date   BIOPSY  11/19/2020   Procedure: BIOPSY;  Surgeon: Juanita Craver, MD;  Location: [facility];  Service: Endoscopy;;   ESOPHAGOGASTRODUODENOSCOPY N/A 01/10/2021   Procedure: ESOPHAGOGASTRODUODENOSCOPY (EGD);  Surgeon: Otis Brace, MD;  Location: [facility];  Service: Gastroenterology;  Laterality: N/A;   ESOPHAGOGASTRODUODENOSCOPY (EGD) WITH PROPOFOL N/A 11/19/2020   Procedure: ESOPHAGOGASTRODUODENOSCOPY (EGD) WITH PROPOFOL;  Surgeon: Juanita Craver, MD;  Location: [facility];  Service: Endoscopy;  Laterality: N/A;   FLEXIBLE SIGMOIDOSCOPY N/A 01/10/2021   Procedure: FLEXIBLE SIGMOIDOSCOPY;  Surgeon: Otis Brace, MD;  Location: [facility];  Service: Gastroenterology;  Laterality: N/A;    Social History:  reports that she has never smoked. She has never used smokeless tobacco. She reports current alcohol use. She reports that she does not use drugs.  Allergies  Allergen Reactions   Azithromycin Shortness Of Breath and Nausea And Vomiting    Family History  Problem Relation Age of Onset   Hypertension Other      Prior to Admission medications   Medication Sig Start Date End Date Taking? Authorizing Provider  acetaminophen (TYLENOL) 325 MG tablet Take 2 tablets (650 mg total) by mouth every 6 (six) hours as needed for mild pain (or Fever >/= 101). 01/26/21  Yes Jennye Boroughs, MD  buPROPion [facility] SR) 150 MG 12 hr tablet Take 150 mg by mouth daily.   Yes [provider]  busPIRone (BUSPAR) 5 MG tablet Take 1 tablet (5 mg total) by mouth 2 (two) times  daily. 01/26/21  Yes Jennye Boroughs, MD  cholecalciferol (VITAMIN D) 25 MCG tablet Take 2 tablets (2,000 Units total) by mouth daily. 11/28/20  Yes Shawna Clamp, MD  cyanocobalamin (,VITAMIN B-12,) 1000 MCG/ML injection Inject 1 mL (1,000 mcg total) into the muscle every 30 (thirty) days. 01/29/21  Yes Jennye Boroughs, MD  diphenhydrAMINE (BENADRYL) 25 MG tablet Take 25 mg by mouth every 8 (eight)  hours as needed for itching.   Yes [provider]  divalproex (DEPAKOTE SPRINKLE) 125 MG capsule Take 125 mg by mouth 2 (two) times daily.   Yes [provider]  folic acid (FOLVITE) 1 MG tablet Take 1 tablet (1 mg total) by mouth daily. 01/27/21  Yes Jennye Boroughs, MD  hydrocortisone cream 1 % Apply 1 application topically 3 (three) times daily.   Yes [provider]  lidocaine (LIDODERM) 5 % Place 1 patch onto the skin daily. Remove & Discard patch within 12 hours or as directed by MD 01/27/21  Yes Jennye Boroughs, MD  melatonin 5 MG TABS Take 10 mg by mouth at bedtime.   Yes [provider]  methocarbamol (ROBAXIN) 500 MG tablet Take 1 tablet (500 mg total) by mouth every 6 (six) hours as needed for muscle spasms. 11/28/20  Yes Shawna Clamp, MD  metoprolol tartrate (LOPRESSOR) 25 MG tablet Take 1 tablet (25 mg total) by mouth 2 (two) times daily. 01/26/21  Yes Jennye Boroughs, MD  omeprazole (PRILOSEC) 20 MG capsule Take 20 mg by mouth in the morning and at bedtime.   Yes [provider]  ondansetron (ZOFRAN-ODT) 4 MG disintegrating tablet Take 1 tablet (4 mg total) by mouth every 8 (eight) hours as needed for nausea or vomiting. 04/09/21  Yes Ali, Amjad, PA-C  oxyCODONE (OXY IR/ROXICODONE) 5 MG immediate release tablet Take 5 mg by mouth every 8 (eight) hours as needed for severe pain.   Yes [provider]  polyethylene glycol (MIRALAX / GLYCOLAX) 17 g packet Take 17 g by mouth daily. 01/27/21  Yes Jennye Boroughs, MD  pregabalin (LYRICA) 75 MG capsule Take 1 capsule (75 mg total) by mouth 3 (three) times daily. 01/26/21  Yes Jennye Boroughs, MD  senna-docusate (SENOKOT-S) 8.6-50 MG tablet Take 1 tablet by mouth at bedtime as needed for mild constipation. 01/26/21  Yes Jennye Boroughs, MD  buPROPion (WELLBUTRIN XL) 150 MG 24 hr tablet Take 1 tablet (150 mg total) by mouth daily. Patient not taking: Reported on 04/12/2021 01/27/21   Jennye Boroughs, MD   pantoprazole (PROTONIX) 40 MG tablet Take 1 tablet (40 mg total) by mouth 2 (two) times daily. Patient not taking: Reported on 04/12/2021 01/26/21   Jennye Boroughs, MD    Physical Exam: Vitals:   04/12/21 1300 04/12/21 1517 04/12/21 1714 04/12/21 2043  BP: 113/83  129/78   Pulse: (!) 107  99   Resp: 18  (!) 24   Temp: 98 F (36.7 C)  98.3 F (36.8 C)   TempSrc: Oral  Oral   SpO2: 97% 96% 100% 95%    Constitutional: NAD, calm, comfortable Eyes: PERRL, lids and conjunctivae normal ENMT: Mucous membranes are moist. Posterior pharynx clear of any exudate or lesions.Normal dentition.  Neck: normal, supple, no masses, no thyromegaly Respiratory: clear to auscultation bilaterally, no wheezing, no crackles. Normal respiratory effort. No accessory muscle use.  Cardiovascular: Regular rate and rhythm, no murmurs / rubs / gallops. No extremity edema. 2+ pedal pulses. No carotid bruits.  Abdomen: no tenderness, no masses palpated. No hepatosplenomegaly.  Bowel sounds positive.  Musculoskeletal: no clubbing / cyanosis. No joint deformity upper and lower extremities. Good ROM, no contractures. Normal muscle tone.  Skin: no rashes, lesions, ulcers. No induration Neurologic: CN 2-12 grossly intact.  She is globally weak, no focal deficits Psychiatric: Normal judgment and insight. Alert and oriented x 3. Normal mood.    Labs on Admission: I have personally reviewed following labs and imaging studies  CBC: Recent Labs  Lab 04/09/21 1517 04/11/21 2301 04/12/21 1207  WBC 7.6 8.1 5.1  NEUTROABS 4.2 4.7 3.7  HGB 12.0 11.2* 11.4*  HCT 37.4 35.0* 34.9*  MCV 83.5 83.1 83.3  PLT 359 369 [id number]   Basic Metabolic Panel: Recent Labs  Lab 04/09/21 1517 04/11/21 2301 04/12/21 1207 04/12/21 1446  NA 135 138  --   --   K 3.7 2.5*  --  3.3*  CL 98 102  --   --   CO2 28 26  --   --   GLUCOSE 90 77  --   --   BUN 5* <5*  --   --   CREATININE 0.48 <0.30* 0.33*  --   CALCIUM 8.2* 8.0*  --   --   MG   --  1.6*  --  1.9   GFR: Estimated Creatinine Clearance: 170.4 mL/min (A) (by C-G formula based on SCr of 0.33 mg/dL (L)). Liver Function Tests: Recent Labs  Lab 04/09/21 1517 04/11/21 2301  AST 228* 173*  ALT 49* 37  ALKPHOS 81 78  BILITOT 1.2 1.9*  PROT 7.3 6.8  ALBUMIN 2.9* 2.7*   Recent Labs  Lab 04/09/21 1517  LIPASE 24   No results for input(s): AMMONIA in the last 168 hours. Coagulation Profile: No results for input(s): INR, PROTIME in the last 168 hours. Cardiac Enzymes: No results for input(s): CKTOTAL, CKMB, CKMBINDEX, TROPONINI in the last 168 hours. BNP (last 3 results) No results for input(s): PROBNP in the last 8760 hours. HbA1C: No results for input(s): HGBA1C in the last 72 hours. CBG: No results for input(s): GLUCAP in the last 168 hours. Lipid Profile: No results for input(s): CHOL, HDL, LDLCALC, TRIG, CHOLHDL, LDLDIRECT in the last 72 hours. Thyroid Function Tests: No results for input(s): TSH, T4TOTAL, FREET4, T3FREE, THYROIDAB in the last 72 hours. Anemia Panel: Recent Labs    04/12/21 1207  FERRITIN 32   Urine analysis:    Component Value Date/Time   COLORURINE STRAW (A) 04/12/2021 0226   APPEARANCEUR CLEAR 04/12/2021 0226   LABSPEC 1.020 04/12/2021 0226   PHURINE 6.5 04/12/2021 0226   GLUCOSEU 100 (A) 04/12/2021 0226   HGBUR LARGE (A) 04/12/2021 0226   BILIRUBINUR MODERATE (A) 04/12/2021 0226   KETONESUR NEGATIVE 04/12/2021 0226   PROTEINUR 30 (A) 04/12/2021 0226   UROBILINOGEN 1.0 10/27/2013 2116   NITRITE NEGATIVE 04/12/2021 0226   LEUKOCYTESUR NEGATIVE 04/12/2021 0226    Radiological Exams on Admission: CT Angio Chest Pulmonary Embolism (PE) W or WO Contrast  Result Date: 04/12/2021 CLINICAL DATA:  Concern for pulmonary embolism. EXAM: CT ANGIOGRAPHY CHEST WITH CONTRAST TECHNIQUE: Multidetector CT imaging of the chest was performed using the standard protocol during bolus administration of intravenous contrast. Multiplanar CT  image reconstructions and MIPs were obtained to evaluate the vascular anatomy. RADIATION DOSE REDUCTION: This exam was performed according to the departmental dose-optimization program which includes automated exposure control, adjustment of the mA and/or kV according to patient size and/or use of iterative reconstruction technique. CONTRAST:  122mL OMNIPAQUE IOHEXOL 350 MG/ML SOLN  COMPARISON:  CT dated 11/03/2020 and chest radiograph dated 04/12/2021. FINDINGS: Evaluation of this exam is limited due to respiratory motion artifact. Cardiovascular: Top-normal cardiac size. No pericardial effusion. The thoracic aorta is unremarkable. Evaluation of the pulmonary arteries is very limited due to severe respiratory motion artifact. No large or central pulmonary artery embolus identified. Mediastinum/Nodes: No hilar or mediastinal adenopathy. The esophagus is grossly unremarkable. No mediastinal fluid collection. Lungs/Pleura: Bilateral patchy airspace opacities most concerning for multilobar pneumonia. No pleural effusion pneumothorax. The central airways are patent. Upper Abdomen: Severe fatty liver. Musculoskeletal: No chest wall abnormality. No acute or significant osseous findings. Review of the MIP images confirms the above findings. IMPRESSION: 1. No CT evidence of central pulmonary artery embolus. 2. Multifocal pneumonia. Clinical correlation and follow-up to resolution recommended. 3. Severe fatty liver. Electronically Signed   By: Anner Crete M.D.   On: 04/12/2021 02:21   DG Chest Port 1 View  Result Date: 04/12/2021 CLINICAL DATA:  Shortness of breath. EXAM: PORTABLE CHEST 1 VIEW COMPARISON:  Chest radiograph dated 01/02/2021. FINDINGS: Shallow inspiration. There is mild cardiomegaly with mild vascular congestion. Left upper lobe streaky densities may represent vascular congestion. Developing infiltrate is not excluded. Clinical correlation is recommended. No focal consolidation, pleural effusion, or  pneumothorax. No acute osseous pathology. IMPRESSION: 1. Mild cardiomegaly with mild vascular congestion. 2. Left upper lobe streaky densities may represent vascular congestion versus developing infiltrate. Electronically Signed   By: Anner Crete M.D.   On: 04/12/2021 00:38    EKG: Independently reviewed.  Sinus rhythm without acute ischemic changes  Assessment/Plan Principal Problem:   Pneumonia due to COVID-19 virus Active Problems:   Nonalcoholic steatohepatitis (NASH)   Class 3 obesity (HCC)   Hypokalemia   GERD (gastroesophageal reflux disease)   Depression   Guillain Barr syndrome (HCC)   Hypomagnesemia     Acute respiratory failure with hypoxia -Secondary to COVID-19 pneumonia -Currently on 3 L of oxygen -We will try and wean off as tolerated -Of note, during her last hospitalization, she was noted to have nocturnal hypoxia and was recommended to have outpatient sleep study  COVID-19 pneumonia -Since she would be considered high risk due to her comorbidities, will treat with IV remdesivir -Started on steroids since she is hypoxic -Continue albuterol and Mucinex -Follow inflammatory markers -Procalcitonin negative  Nausea and vomiting -She did have similar episodes during her last hospitalization, but reports improvement -Her current symptoms recurred over the past week -Treat supportively with antiemetics -We will keep on clear liquids for now  Constipation -Reports that she has not had a bowel movement in approximately a week -We will start on MiraLAX -Patient was offered enema/suppository, but she reports that she cannot tolerate this due to pain in her rectal area. -On her previous admission, she was noted to have proctitis.  Hypokalemia/hypomagnesemia -Related to vomiting and decreased p.o. intake -This is being replaced  Depression/anxiety -Continue outpatient dose of Wellbutrin and BuSpar -She is also on Depakote  Morbid obesity -Increased risk  of morbidity and mortality with her current illness.  Recent Guillain-Barr diagnosis -Continue physical therapy  DVT prophylaxis: Lovenox Code Status: Full code Family Communication: Patient wishes that her cousin, Johnny Bridge be her power of attorney.  She did not feel was necessary for me to call [facility] for an update at this time Disposition Plan: Return to skilled nursing facility on discharge Consults called:   Admission status: Inpatient, telemetry  Kathie Dike MD [facility]   If 7PM-7AM, please contact  night-coverage [email or web address]   04/12/2021, 8:58 PM

## 2021-04-12 NOTE — Progress Notes (Signed)
Patient complained of weakness, pale in color after attempting to have a bowel movement. Vital signs: T-98.3, P-99, R-24, BP-129/78, O2-100% at 3 liters. Patients color has come back some, patient had a small amount of blood unable to determine if its rectal or wound related. MD Memon made aware. No new orders.

## 2021-04-12 NOTE — ED Provider Notes (Signed)
Signed out to me by Dr. Doren Custard.  Patient with new oxygen requirement.  Patient desaturates into the low to mid 80s on room air at rest.  Patient with recent COVID diagnosis.  CT angiography performed because of increased risk for PE.  No PE is seen but she does have multifocal pneumonia that would be consistent with COVID-19.  Additionally, patient hypomagnesemic and hypokalemic, both replenished.  Will admit to hospital for further management.   Orpah Greek, MD 04/12/21 [phone]

## 2021-04-12 NOTE — ED Notes (Signed)
Dr Adefeso at bedside. 

## 2021-04-12 NOTE — ED Notes (Signed)
Attempted report x1. 

## 2021-04-13 DIAGNOSIS — J9601 Acute respiratory failure with hypoxia: Secondary | ICD-10-CM

## 2021-04-13 LAB — COMPREHENSIVE METABOLIC PANEL
ALT: 41 U/L (ref 0–44)
AST: 211 U/L — ABNORMAL HIGH (ref 15–41)
Albumin: 2.8 g/dL — ABNORMAL LOW (ref 3.5–5.0)
Alkaline Phosphatase: 82 U/L (ref 38–126)
Anion gap: 10 (ref 5–15)
BUN: <5 mg/dL — ABNORMAL LOW (ref 6–20)
CO2: 25 mmol/L (ref 22–32)
Calcium: 8.4 mg/dL — ABNORMAL LOW (ref 8.9–10.3)
Chloride: 104 mmol/L (ref 98–111)
Creatinine, Ser: 0.32 mg/dL — ABNORMAL LOW (ref 0.44–1.00)
GFR, Estimated: >60 mL/min (ref >60)
Glucose, Bld: 115 mg/dL — ABNORMAL HIGH (ref 70–99)
Potassium: 3.8 mmol/L (ref 3.5–5.1)
Sodium: 139 mmol/L (ref 135–145)
Total Bilirubin: 1.6 mg/dL — ABNORMAL HIGH (ref 0.3–1.2)
Total Protein: 6.7 g/dL (ref 6.5–8.1)

## 2021-04-13 LAB — CBC WITH DIFFERENTIAL/PLATELET
Abs Immature Granulocytes: 0.04 10*3/uL (ref 0.00–0.07)
Basophils Absolute: 0 10*3/uL (ref 0.0–0.1)
Basophils Relative: 0 %
Eosinophils Absolute: 0 10*3/uL (ref 0.0–0.5)
Eosinophils Relative: 0 %
HCT: 34.5 % — ABNORMAL LOW (ref 36.0–46.0)
Hemoglobin: 10.6 g/dL — ABNORMAL LOW (ref 12.0–15.0)
Immature Granulocytes: 1 %
Lymphocytes Relative: 23 %
Lymphs Abs: 1.4 10*3/uL (ref 0.7–4.0)
MCH: 25 pg — ABNORMAL LOW (ref 26.0–34.0)
MCHC: 30.7 g/dL (ref 30.0–36.0)
MCV: 81.4 fL (ref 80.0–100.0)
Monocytes Absolute: 0.3 10*3/uL (ref 0.1–1.0)
Monocytes Relative: 4 %
Neutro Abs: 4.3 10*3/uL (ref 1.7–7.7)
Neutrophils Relative %: 72 %
Platelets: 449 10*3/uL — ABNORMAL HIGH (ref 150–400)
RBC: 4.24 MIL/uL (ref 3.87–5.11)
RDW: 21.1 % — ABNORMAL HIGH (ref 11.5–15.5)
WBC: 6.1 10*3/uL (ref 4.0–10.5)
nRBC: 0 % (ref 0.0–0.2)

## 2021-04-13 LAB — C-REACTIVE PROTEIN: CRP: 2.7 mg/dL — ABNORMAL HIGH (ref <1.0)

## 2021-04-13 LAB — FERRITIN: Ferritin: 34 ng/mL (ref 11–307)

## 2021-04-13 LAB — MAGNESIUM: Magnesium: 1.9 mg/dL (ref 1.7–2.4)

## 2021-04-13 LAB — D-DIMER, QUANTITATIVE: D-Dimer, Quant: 0.46 ug/mL-FEU (ref 0.00–0.50)

## 2021-04-13 MED ORDER — ALBUTEROL SULFATE HFA 108 (90 BASE) MCG/ACT IN AERS: 1.0000 | INHALATION_SPRAY | Freq: Four times a day (QID) | RESPIRATORY_TRACT | Status: DC | PRN

## 2021-04-13 MED ORDER — LIDOCAINE 5 % EX OINT
TOPICAL_OINTMENT | Freq: Every day | CUTANEOUS | Status: DC | PRN
  Filled 2021-04-13: qty 35.44

## 2021-04-13 MED ORDER — ALBUTEROL SULFATE HFA 108 (90 BASE) MCG/ACT IN AERS
2.0000 | INHALATION_SPRAY | Freq: Two times a day (BID) | RESPIRATORY_TRACT | Status: DC
  Administered 2021-04-13 – 2021-04-16 (×7): 2 via RESPIRATORY_TRACT

## 2021-04-13 MED ORDER — HYDROCORTISONE ACETATE 25 MG RE SUPP
25.0000 mg | Freq: Two times a day (BID) | RECTAL | Status: DC
  Administered 2021-04-13 – 2021-04-16 (×3): 25 mg via RECTAL
  Filled 2021-04-13 (×9): qty 1

## 2021-04-13 NOTE — Evaluation (Signed)
Physical Therapy Evaluation Patient Details Name: Carla Little MRN: [id number] DOB: 08/03/1990 Today's Date: 04/13/2021  History of Present Illness  Carla Little is a 31 y.o. female with medical history significant of recent prolonged hospitalization where she was treated for left knee dislocation.  She also developed Guillain-Barr syndrome and was treated with IVIG.  Due to persistent weakness, she was discharged to skilled nursing facility for rehab.  She reports that she is in but has made slow progress and is now able to sit up on the side of the bed.  She is not able to stand as of yet.  She reports that she has had nausea, vomiting and cough for the past week.  Since yesterday, she had increasing shortness of breath.  She did not have any fever.  When checked yesterday, she was noted to be hypoxic.  She reports having difficulty keeping anything down.  She reports being diagnosed with COVID-19 approximately 1 week ago.  She was seen in the ER at that time and discharged back to skilled facility after being treated supportively with IV fluids and antiemetics.   Clinical Impression  Patient demonstrates slow labored movement for sitting up at bedside with most difficulty moving LLE due to c/o severe knee pain, had to raise HOB and use bed rail, once seated at bedside able to extend knees against gravity, but unable to attempt sit to stands due to weakness and left knee pain.  Patient put back to bed with 2 person assist and patient demonstrates good return for using BUE and RLE to help reposition self with bed in head down position.  Patient will benefit from continued skilled physical therapy in hospital and recommended venue below to increase strength, balance, endurance for safe ADLs and gait.         Recommendations for follow up therapy are one component of a multi-disciplinary discharge planning process, led by the attending physician.  Recommendations may be updated based on  patient status, additional functional criteria and insurance authorization.  Follow Up Recommendations Skilled nursing-short term rehab (<3 hours/day) (Bariatric rehab center if possible)    Assistance Recommended at Discharge Intermittent Supervision/Assistance  Patient can return home with the following  A lot of help with walking and/or transfers;A lot of help with bathing/dressing/bathroom;Assistance with cooking/housework    Equipment Recommendations None recommended by PT  Recommendations for Other Services       Functional Status Assessment Patient has had a recent decline in their functional status and demonstrates the ability to make significant improvements in function in a reasonable and predictable amount of time.     Precautions / Restrictions Precautions Precautions: Fall Restrictions Weight Bearing Restrictions: No      Mobility  Bed Mobility Overal bed mobility: Needs Assistance Bed Mobility: Rolling, Sidelying to Sit Rolling: Max assist Sidelying to sit: Mod assist, Max assist, HOB elevated   Sit to supine: Max assist   General bed mobility comments: slow labored movement and limited mostly due to c/o severe pain left knee with movement    Transfers                        Ambulation/Gait                  Stairs            Wheelchair Mobility    Modified Rankin (Stroke Patients Only)       Balance Overall balance assessment: Needs assistance  Sitting-balance support: Feet supported, No upper extremity supported Sitting balance-Leahy Scale: Fair Sitting balance - Comments: poor to fair seated EOB                                     Pertinent Vitals/Pain Pain Assessment Pain Assessment: 0-10 Pain Score: 6  Pain Location: L knee Pain Descriptors / Indicators: Throbbing Pain Intervention(s): Limited activity within patient's tolerance, Monitored during session, Repositioned    Home Living  Family/patient expects to be discharged to:: Private residence Living Arrangements: Alone Available Help at Discharge: Friend(s);Available PRN/intermittently Type of Home: Apartment Home Access: Level entry       Home Layout: One level   Additional Comments: Pt coming form a SNF    Prior Function Prior Level of Function : Needs assist       Physical Assist : Mobility (physical);ADLs (physical) Mobility (physical): Bed mobility;Transfers;Gait;Stairs ADLs (physical): Grooming;Bathing;Dressing;Toileting;IADLs Mobility Comments: was ambulatory since fall and dislocation of left knee, since at SNF pt has needed a lift for transfers and physical assist for bed mobility. ADLs Comments: Pt was maximally assisted by SNF staff for ADL's per pt report.     Hand Dominance   Dominant Hand: Right    Extremity/Trunk Assessment   Upper Extremity Assessment Upper Extremity Assessment: Defer to OT evaluation    Lower Extremity Assessment Lower Extremity Assessment: Generalized weakness    Cervical / Trunk Assessment Cervical / Trunk Assessment: Normal  Communication   Communication: No difficulties  Cognition Arousal/Alertness: Awake/alert Behavior During Therapy: WFL for tasks assessed/performed Overall Cognitive Status: Within Functional Limits for tasks assessed                                          General Comments      Exercises     Assessment/Plan    PT Assessment Patient needs continued PT services  PT Problem List Decreased strength;Decreased activity tolerance;Decreased balance;Decreased mobility       PT Treatment Interventions DME instruction;Gait training;Functional mobility training;Therapeutic activities;Therapeutic exercise;Patient/family education;Wheelchair mobility training;Balance training    PT Goals (Current goals can be found in the Care Plan section)  Acute Rehab PT Goals Patient Stated Goal: return home after rehab PT Goal  Formulation: With patient Time For Goal Achievement: 04/27/21 Potential to Achieve Goals: Good    Frequency Min 3X/week     Co-evaluation PT/OT/SLP Co-Evaluation/Treatment: Yes Reason for Co-Treatment: Complexity of the patient's impairments (multi-system involvement);To address functional/ADL transfers PT goals addressed during session: Mobility/safety with mobility;Balance;Proper use of DME OT goals addressed during session: ADL's and self-care       AM-PAC PT "6 Clicks" Mobility  Outcome Measure Help needed turning from your back to your side while in a flat bed without using bedrails?: A Lot Help needed moving from lying on your back to sitting on the side of a flat bed without using bedrails?: A Lot Help needed moving to and from a bed to a chair (including a wheelchair)?: Total Help needed standing up from a chair using your arms (e.g., wheelchair or bedside chair)?: Total Help needed to walk in hospital room?: Total Help needed climbing 3-5 steps with a railing? : Total 6 Click Score: 8    End of Session   Activity Tolerance: Patient tolerated treatment well;Patient limited by fatigue;Patient limited by pain Patient  left: in bed;with call bell/phone within reach Nurse Communication: Mobility status PT Visit Diagnosis: Unsteadiness on feet (R26.81);Other abnormalities of gait and mobility (R26.89);Muscle weakness (generalized) (M62.81)    Time: [id number] PT Time Calculation (min) (ACUTE ONLY): 28 min   Charges:   PT Evaluation $PT Eval Moderate Complexity: 1 Mod PT Treatments $Therapeutic Activity: 23-37 mins        1:52 PM, 04/13/21 Lonell Grandchild, MPT Physical Therapist with [facility] [phone] office [phone] mobile phone

## 2021-04-13 NOTE — Plan of Care (Signed)
°  Problem: Acute Rehab OT Goals (only OT should resolve) Goal: Pt. Will Perform Grooming Flowsheets (Taken 04/13/2021 1249) Pt Will Perform Grooming:  sitting  with min assist Goal: Pt. Will Perform Upper Body Bathing Flowsheets (Taken 04/13/2021 1249) Pt Will Perform Upper Body Bathing:  sitting  with min assist  with adaptive equipment Goal: Pt. Will Perform Lower Body Bathing Flowsheets (Taken 04/13/2021 1249) Pt Will Perform Lower Body Bathing:  with mod assist  bed level  with adaptive equipment Goal: Pt. Will Perform Upper Body Dressing Flowsheets (Taken 04/13/2021 1249) Pt Will Perform Upper Body Dressing:  with min assist  sitting  with adaptive equipment Goal: Pt. Will Perform Lower Body Dressing Flowsheets (Taken 04/13/2021 1249) Pt Will Perform Lower Body Dressing:  with mod assist  sitting/lateral leans  bed level  with adaptive equipment Goal: Pt. Will Transfer To Toilet Flowsheets (Taken 04/13/2021 1249) Pt Will Transfer to Toilet:  with max assist  with mod assist  stand pivot transfer Goal: Pt/Caregiver Will Perform Home Exercise Program Flowsheets (Taken 04/13/2021 1249) Pt/caregiver will Perform Home Exercise Program:  Increased ROM  Increased strength  Both right and left upper extremity  With minimal assist  Isatou Agredano OT, MOT

## 2021-04-13 NOTE — TOC Progression Note (Signed)
Transition of Care [facility]) - Progression Note    Patient Details  Name: Carla Little MRN: [id number] Date of Birth: [birth year]  Transition of Care [facility]) CM/SW Contact  Boneta Lucks, RN Phone Number: 04/13/2021, 3:12 PM  Clinical Narrative:  Patient from [facility]. PT continues to recommend SNF. TOC to follow with plans to discharge back to [facility].   Expected Discharge Plan: Skilled Nursing Facility Barriers to Discharge: Continued Medical Work up

## 2021-04-13 NOTE — Progress Notes (Signed)
PROGRESS NOTE    Carla Little  [id number] DOB: [birth year] DOA: 04/11/2021 PCP: Patient, No Pcp Per (Inactive)    Brief Narrative:  31 year old female with recent diagnosis of Guillain-Barr syndrome, morbid obesity, currently a resident of a skilled nursing facility for rehab, admitted to the hospital with shortness of breath and hypoxia.  Found to have COVID-19 pneumonia.   Assessment & Plan:   Principal Problem:   Pneumonia due to COVID-19 virus Active Problems:   Nonalcoholic steatohepatitis (NASH)   Class 3 obesity (HCC)   Hypokalemia   GERD (gastroesophageal reflux disease)   Depression   Guillain Barr syndrome (HCC)   Hypomagnesemia   Acute respiratory failure with hypoxia -Secondary to COVID-19 pneumonia -Requiring 3 L of oxygen on admission -Currently has been weaned down to room air -Of note, during her last hospitalization, she was noted to have nocturnal hypoxia and was recommended to have outpatient sleep study   COVID-19 pneumonia -Since she would be considered high risk due to her comorbidities, will treat with IV remdesivir -Started on steroids since she is hypoxic -Continue albuterol and Mucinex -Inflammatory markers mildly elevated on admission, but trending down -Procalcitonin negative -D-dimer negative   Nausea and vomiting -She did have similar episodes during her last hospitalization, but reports improvement -Her current symptoms recurred over the past week -Treat supportively with antiemetics -Appears to be tolerating clear liquids, will advance to full liquids   Constipation -Reports that she has not had a bowel movement in over a week -She has been started on MiraLAX twice a day -She has had difficulty tolerating smog enema -On her previous admission, she was noted to have proctitis. -She had flex sig performed in 12/2020 that showed internal hemorrhoids and large amounts of stool -Start the patient on Anusol suppositories -We  will try tapwater enema today   Hypokalemia/hypomagnesemia -Related to vomiting and decreased p.o. intake -This is being replaced   Depression/anxiety -Continue outpatient dose of Wellbutrin and BuSpar -She is also on Depakote   Morbid obesity -Increased risk of morbidity and mortality with her current illness.   Recent Guillain-Barr diagnosis -Continue physical therapy     DVT prophylaxis:   Lovenox  Code Status: Full code Family Communication: Discussed with patient Disposition Plan: Status is: Inpatient  Remains inpatient appropriate because: Management of COVID-19 pneumonia, nausea and vomiting         Consultants:    Procedures:    Antimicrobials:      Subjective: Feels overall nausea and vomiting is improving.  Tolerating clear liquids.  Shortness of breath does feel better today.  She has been off oxygen since last night.  She continues to have cough.  She is not had a bowel movement as of yet.  Complains of significant soreness in her rectal area after trying to have enema yesterday.  Objective: Vitals:   04/13/21 0438 04/13/21 0751 04/13/21 1305 04/13/21 2001  BP: 103/74  111/72   Pulse: 88  97   Resp:   18   Temp:   98.3 F (36.8 C)   TempSrc:   Oral   SpO2: 93% 94% 91% 95%    Intake/Output Summary (Last 24 hours) at 04/13/2021 2032 Last data filed at 04/13/2021 1805 Gross per 24 hour  Intake 803.27 ml  Output 700 ml  Net 103.27 ml   There were no vitals filed for this visit.  Examination:  General exam: Appears calm and comfortable  Respiratory system: Clear to auscultation. Respiratory effort normal. Cardiovascular system: S1 &  S2 heard, RRR. No JVD, murmurs, rubs, gallops or clicks. No pedal edema. Gastrointestinal system: Abdomen is nondistended, soft and nontender. No organomegaly or masses felt. Normal bowel sounds heard. Central nervous system: Alert and oriented. No focal neurological deficits. Extremities: Symmetric 5 x 5  power. Skin: No rashes, lesions or ulcers Psychiatry: Judgement and insight appear normal. Mood & affect appropriate.     Data Reviewed: I have personally reviewed following labs and imaging studies  CBC: Recent Labs  Lab 04/09/21 1517 04/11/21 2301 04/12/21 1207 04/13/21 0601  WBC 7.6 8.1 5.1 6.1  NEUTROABS 4.2 4.7 3.7 4.3  HGB 12.0 11.2* 11.4* 10.6*  HCT 37.4 35.0* 34.9* 34.5*  MCV 83.5 83.1 83.3 81.4  PLT 359 369 374 449*   Basic Metabolic Panel: Recent Labs  Lab 04/09/21 1517 04/11/21 2301 04/12/21 1207 04/12/21 1446 04/13/21 0601  NA 135 138  --   --  139  K 3.7 2.5*  --  3.3* 3.8  CL 98 102  --   --  104  CO2 28 26  --   --  25  GLUCOSE 90 77  --   --  115*  BUN 5* <5*  --   --  <5*  CREATININE 0.48 <0.30* 0.33*  --  0.32*  CALCIUM 8.2* 8.0*  --   --  8.4*  MG  --  1.6*  --  1.9 1.9   GFR: Estimated Creatinine Clearance: 170.4 mL/min (A) (by C-G formula based on SCr of 0.32 mg/dL (L)). Liver Function Tests: Recent Labs  Lab 04/09/21 1517 04/11/21 2301 04/13/21 0601  AST 228* 173* 211*  ALT 49* 37 41  ALKPHOS 81 78 82  BILITOT 1.2 1.9* 1.6*  PROT 7.3 6.8 6.7  ALBUMIN 2.9* 2.7* 2.8*   Recent Labs  Lab 04/09/21 1517  LIPASE 24   No results for input(s): AMMONIA in the last 168 hours. Coagulation Profile: No results for input(s): INR, PROTIME in the last 168 hours. Cardiac Enzymes: No results for input(s): CKTOTAL, CKMB, CKMBINDEX, TROPONINI in the last 168 hours. BNP (last 3 results) No results for input(s): PROBNP in the last 8760 hours. HbA1C: No results for input(s): HGBA1C in the last 72 hours. CBG: No results for input(s): GLUCAP in the last 168 hours. Lipid Profile: No results for input(s): CHOL, HDL, LDLCALC, TRIG, CHOLHDL, LDLDIRECT in the last 72 hours. Thyroid Function Tests: No results for input(s): TSH, T4TOTAL, FREET4, T3FREE, THYROIDAB in the last 72 hours. Anemia Panel: Recent Labs    04/12/21 1207 04/13/21 0601   FERRITIN 32 34   Sepsis Labs: Recent Labs  Lab 04/12/21 1207  PROCALCITON <0.10    Recent Results (from the past 240 hour(s))  Resp Panel by RT-PCR (Flu A&B, Covid) Nasopharyngeal Swab     Status: Abnormal   Collection Time: 04/09/21  5:14 PM   Specimen: Nasopharyngeal Swab; Nasopharyngeal(NP) swabs in vial transport medium  Result Value Ref Range Status   SARS Coronavirus 2 by RT PCR POSITIVE (A) NEGATIVE Final    Comment: (NOTE) SARS-CoV-2 target nucleic acids are DETECTED.  The SARS-CoV-2 RNA is generally detectable in upper respiratory specimens during the acute phase of infection. Positive results are indicative of the presence of the identified virus, but do not rule out bacterial infection or co-infection with other pathogens not detected by the test. Clinical correlation with patient history and other diagnostic information is necessary to determine patient infection status. The expected result is Negative.  Fact Sheet for Patients: BloggerCourse.comhttps://www.fda.gov/media/152166/download  Fact Sheet for Healthcare Providers: SeriousBroker.it  This test is not yet approved or cleared by the [place] FDA and  has been authorized for detection and/or diagnosis of SARS-CoV-2 by FDA under an Emergency Use Authorization (EUA).  This EUA will remain in effect (meaning this test can be used) for the duration of  the COVID-19 declaration under Section 564(b)(1) of the A ct, 21 U.S.C. section 360bbb-3(b)(1), unless the authorization is terminated or revoked sooner.     Influenza A by PCR NEGATIVE NEGATIVE Final   Influenza B by PCR NEGATIVE NEGATIVE Final    Comment: (NOTE) The Xpert Xpress SARS-CoV-2/FLU/RSV plus assay is intended as an aid in the diagnosis of influenza from Nasopharyngeal swab specimens and should not be used as a sole basis for treatment. Nasal washings and aspirates are unacceptable for Xpert Xpress  SARS-CoV-2/FLU/RSV testing.  Fact Sheet for Patients: BloggerCourse.com  Fact Sheet for Healthcare Providers: SeriousBroker.it  This test is not yet approved or cleared by the [place] FDA and has been authorized for detection and/or diagnosis of SARS-CoV-2 by FDA under an Emergency Use Authorization (EUA). This EUA will remain in effect (meaning this test can be used) for the duration of the COVID-19 declaration under Section 564(b)(1) of the Act, 21 U.S.C. section 360bbb-3(b)(1), unless the authorization is terminated or revoked.  Performed at [facility], [address] [place], [place] [address]          Radiology Studies: CT Angio Chest Pulmonary Embolism (PE) W or WO Contrast  Result Date: 04/12/2021 CLINICAL DATA:  Concern for pulmonary embolism. EXAM: CT ANGIOGRAPHY CHEST WITH CONTRAST TECHNIQUE: Multidetector CT imaging of the chest was performed using the standard protocol during bolus administration of intravenous contrast. Multiplanar CT image reconstructions and MIPs were obtained to evaluate the vascular anatomy. RADIATION DOSE REDUCTION: This exam was performed according to the departmental dose-optimization program which includes automated exposure control, adjustment of the mA and/or kV according to patient size and/or use of iterative reconstruction technique. CONTRAST:  OMNIPAQUE IOHEXOL 350 MG/ML SOLN COMPARISON:  CT dated 11/03/2020 and chest radiograph dated 04/12/2021. FINDINGS: Evaluation of this exam is limited due to respiratory motion artifact. Cardiovascular: Top-normal cardiac size. No pericardial effusion. The thoracic aorta is unremarkable. Evaluation of the pulmonary arteries is very limited due to severe respiratory motion artifact. No large or central pulmonary artery embolus identified. Mediastinum/Nodes: No hilar or mediastinal adenopathy. The esophagus is grossly unremarkable. No mediastinal  fluid collection. Lungs/Pleura: Bilateral patchy airspace opacities most concerning for multilobar pneumonia. No pleural effusion pneumothorax. The central airways are patent. Upper Abdomen: Severe fatty liver. Musculoskeletal: No chest wall abnormality. No acute or significant osseous findings. Review of the MIP images confirms the above findings. IMPRESSION: 1. No CT evidence of central pulmonary artery embolus. 2. Multifocal pneumonia. Clinical correlation and follow-up to resolution recommended. 3. Severe fatty liver. Electronically Signed   By: Elgie Collard M.D.   On: 04/12/2021 02:21   DG Chest Port 1 View  Result Date: 04/12/2021 CLINICAL DATA:  Shortness of breath. EXAM: PORTABLE CHEST 1 VIEW COMPARISON:  Chest radiograph dated 01/02/2021. FINDINGS: Shallow inspiration. There is mild cardiomegaly with mild vascular congestion. Left upper lobe streaky densities may represent vascular congestion. Developing infiltrate is not excluded. Clinical correlation is recommended. No focal consolidation, pleural effusion, or pneumothorax. No acute osseous pathology. IMPRESSION: 1. Mild cardiomegaly with mild vascular congestion. 2. Left upper lobe streaky densities may represent vascular congestion versus developing infiltrate. Electronically Signed   By:  Elgie CollardArash  Radparvar M.D.   On: 04/12/2021 00:38        Scheduled Meds:  albuterol  2 puff Inhalation BID   bisacodyl  10 mg Rectal Once   buPROPion  150 mg Oral Daily   busPIRone  5 mg Oral BID   divalproex  125 mg Oral BID   enoxaparin (LOVENOX) injection  90 mg Subcutaneous Daily   folic acid  1 mg Oral Daily   hydrocortisone  25 mg Rectal BID   lidocaine  1 patch Transdermal Daily   melatonin  9 mg Oral QHS   methylPREDNISolone (SOLU-MEDROL) injection  0.5 mg/kg Intravenous Q12H   Followed by   Melene Muller[START ON 04/15/2021] predniSONE  50 mg Oral Daily   metoprolol tartrate  12.5 mg Oral BID   pantoprazole  40 mg Oral BID AC   polyethylene glycol   17 g Oral BID   potassium chloride  40 mEq Oral BID   pregabalin  75 mg Oral TID   senna-docusate  1 tablet Oral BID   sorbitol, milk of mag, mineral oil, glycerin (SMOG) enema  960 mL Rectal Once   Continuous Infusions:  remdesivir 100 mg in NS 100 mL 100 mg (04/13/21 1221)     LOS: 1 day    Time spent: 35mins    Erick BlinksJehanzeb Najma Bozarth, MD [facility]   If 7PM-7AM, please contact night-coverage [email or web address]  04/13/2021, 8:32 PM

## 2021-04-13 NOTE — Plan of Care (Signed)
°  Problem: Acute Rehab PT Goals(only PT should resolve) Goal: Pt will Roll Supine to Side Outcome: Progressing Flowsheets (Taken 04/13/2021 1354) Pt will Roll Supine to Side:  with mod assist  with min assist Goal: Pt Will Go Supine/Side To Sit Outcome: Progressing Flowsheets (Taken 04/13/2021 1354) Pt will go Supine/Side to Sit:  with minimal assist  with moderate assist Goal: Pt Will Go Sit To Supine/Side Outcome: Progressing Flowsheets (Taken 04/13/2021 1354) Pt will go Sit to Supine/Side:  with moderate assist  with minimal assist Goal: Patient Will Perform Sitting Balance Outcome: Progressing Flowsheets (Taken 04/13/2021 1354) Patient will perform sitting balance:  with supervision  with min guard assist Goal: Patient Will Transfer Sit To/From Stand Outcome: Progressing Flowsheets (Taken 04/13/2021 1354) Patient will transfer sit to/from stand: with maximum assist   1:55 PM, 04/13/21 Lonell Grandchild, MPT Physical Therapist with [facility] [phone] office [phone] mobile phone

## 2021-04-13 NOTE — Evaluation (Signed)
Occupational Therapy Evaluation Patient Details Name: Carla Little MRN: [id number] DOB: December 09, 1990 Today's Date: 04/13/2021   History of Present Illness Carla Little is a 31 y.o. female with medical history significant of recent prolonged hospitalization where she was treated for left knee dislocation.  She also developed Guillain-Barr syndrome and was treated with IVIG.  Due to persistent weakness, she was discharged to skilled nursing facility for rehab.  She reports that she is in but has made slow progress and is now able to sit up on the side of the bed.  She is not able to stand as of yet.  She reports that she has had nausea, vomiting and cough for the past week.  Since yesterday, she had increasing shortness of breath.  She did not have any fever.  When checked yesterday, she was noted to be hypoxic.  She reports having difficulty keeping anything down.  She reports being diagnosed with COVID-19 approximately 1 week ago.  She was seen in the ER at that time and discharged back to skilled facility after being treated supportively with IV fluids and antiemetics.   Clinical Impression   Pt agreeable to OT and PT co-evaluation. Pt demonstrates significant weakness needing HOB elevated and mod to max A for rolling and sidelying to sit bed mobility. Pt reports needing a mechanical lift while at the SNF. Pt demonstrated limited B UE strength to ~50% available range of motion against gravity with normal fine motor and gross motor coordination. Pt was motivated to participate but fatigued quickly while seated at EOB. Pt would benefit from bariatric rehab if possible due to limitations in therapy as a result of pt's size. Pt will benefit from continued OT in the hospital and recommended venue below to increase strength, balance, and endurance for safe ADL's.        Recommendations for follow up therapy are one component of a multi-disciplinary discharge planning process, led by the attending  physician.  Recommendations may be updated based on patient status, additional functional criteria and insurance authorization.   Follow Up Recommendations  Skilled nursing-short term rehab (<3 hours/day) (Bariatric center if possible.)    Assistance Recommended at Discharge Frequent or constant Supervision/Assistance  Patient can return home with the following Two people to help with walking and/or transfers;A lot of help with bathing/dressing/bathroom;Assistance with cooking/housework;Assist for transportation;Help with stairs or ramp for entrance    Functional Status Assessment  Patient has had a recent decline in their functional status and demonstrates the ability to make significant improvements in function in a reasonable and predictable amount of time.  Equipment Recommendations  None recommended by OT           Precautions / Restrictions Precautions Precautions: Fall Restrictions Weight Bearing Restrictions: No      Mobility Bed Mobility Overal bed mobility: Needs Assistance Bed Mobility: Rolling, Sidelying to Sit, Sit to Supine Rolling: Max assist Sidelying to sit: Mod assist, Max assist, HOB elevated   Sit to supine: Max assist   General bed mobility comments: Very labored movement with significant assist.    Transfers                          Balance Overall balance assessment: Needs assistance Sitting-balance support: Single extremity supported, Feet supported Sitting balance-Leahy Scale: Fair Sitting balance - Comments: poor to fair seated EOB  ADL either performed or assessed with clinical judgement   ADL Overall ADL's : Needs assistance/impaired     Grooming: Moderate assistance;Maximal assistance;Bed level   Upper Body Bathing: Minimal assistance;Moderate assistance;Sitting   Lower Body Bathing: Total assistance;Maximal assistance;Bed level   Upper Body Dressing : Minimal  assistance;Sitting   Lower Body Dressing: Total assistance;Bed level   Toilet Transfer: Total assistance             General ADL Comments: Pt is very limited by weakness and obesity. Pt demonstrates quick fatigue during functional activities.     Vision Baseline Vision/History: 0 No visual deficits Ability to See in Adequate Light: 0 Adequate Patient Visual Report: No change from baseline Vision Assessment?: No apparent visual deficits                Pertinent Vitals/Pain Pain Assessment Pain Assessment: 0-10 Pain Score: 6  Pain Location: L knee Pain Descriptors / Indicators: Throbbing Pain Intervention(s): Limited activity within patient's tolerance, Monitored during session, Repositioned     Hand Dominance Right   Extremity/Trunk Assessment Upper Extremity Assessment Upper Extremity Assessment: Generalized weakness (Able to achieve [facility] A/ROM of shoulder while supine with HOB elevated. When seated at EOB pt was at ~50% of available range for shoulder flexion. WFL fine and gross motor coordination.)   Lower Extremity Assessment Lower Extremity Assessment: Defer to PT evaluation       Communication Communication Communication: No difficulties   Cognition Arousal/Alertness: Awake/alert Behavior During Therapy: WFL for tasks assessed/performed Overall Cognitive Status: Within Functional Limits for tasks assessed                                                        Home Living Family/patient expects to be discharged to:: Skilled nursing facility                                 Additional Comments: Pt coming form a SNF      Prior Functioning/Environment Prior Level of Function : Needs assist       Physical Assist : Mobility (physical);ADLs (physical) Mobility (physical): Bed mobility;Transfers;Gait;Stairs ADLs (physical): Grooming;Bathing;Dressing;Toileting;IADLs Mobility Comments: While at the SNF pt has needed a  lift for transfers and physical assist for bed mobility. ADLs Comments: Pt was maximally assisted by SNF staff for ADL's per pt report.        OT Problem List: Decreased strength;Decreased range of motion;Decreased activity tolerance;Impaired balance (sitting and/or standing);Obesity      OT Treatment/Interventions: Self-care/ADL training;Therapeutic exercise;Therapeutic activities;Patient/family education;Balance training;Energy conservation    OT Goals(Current goals can be found in the care plan section) Acute Rehab OT Goals Patient Stated Goal: Continue rehab. OT Goal Formulation: With patient Time For Goal Achievement: 04/27/21 Potential to Achieve Goals: Fair  OT Frequency: Min 2X/week    Co-evaluation PT/OT/SLP Co-Evaluation/Treatment: Yes Reason for Co-Treatment: Complexity of the patient's impairments (multi-system involvement)   OT goals addressed during session: ADL's and self-care                       End of Session    Activity Tolerance: Patient tolerated treatment well Patient left: in bed;with call bell/phone within reach  OT Visit Diagnosis: Unsteadiness on feet (R26.81);Other abnormalities of gait and mobility (  R26.89);Muscle weakness (generalized) (M62.81);Other symptoms and signs involving the nervous system [id number])                Time: [id number] OT Time Calculation (min): 26 min Charges:  OT General Charges $OT Visit: 1 Visit OT Evaluation $OT Eval Moderate Complexity: 1 Mod  Rosiland Sen OT, MOT  Larey Seat 04/13/2021, 12:45 PM

## 2021-04-14 LAB — CBC WITH DIFFERENTIAL/PLATELET
Abs Immature Granulocytes: 0.05 10*3/uL (ref 0.00–0.07)
Basophils Absolute: 0 10*3/uL (ref 0.0–0.1)
Basophils Relative: 0 %
Eosinophils Absolute: 0 10*3/uL (ref 0.0–0.5)
Eosinophils Relative: 0 %
HCT: 35.6 % — ABNORMAL LOW (ref 36.0–46.0)
Hemoglobin: 11.5 g/dL — ABNORMAL LOW (ref 12.0–15.0)
Immature Granulocytes: 1 %
Lymphocytes Relative: 18 %
Lymphs Abs: 1.5 10*3/uL (ref 0.7–4.0)
MCH: 26.9 pg (ref 26.0–34.0)
MCHC: 32.3 g/dL (ref 30.0–36.0)
MCV: 83.4 fL (ref 80.0–100.0)
Monocytes Absolute: 0.4 10*3/uL (ref 0.1–1.0)
Monocytes Relative: 5 %
Neutro Abs: 6.3 10*3/uL (ref 1.7–7.7)
Neutrophils Relative %: 76 %
Platelets: 547 10*3/uL — ABNORMAL HIGH (ref 150–400)
RBC: 4.27 MIL/uL (ref 3.87–5.11)
RDW: 21.7 % — ABNORMAL HIGH (ref 11.5–15.5)
WBC: 8.2 10*3/uL (ref 4.0–10.5)
nRBC: 0 % (ref 0.0–0.2)

## 2021-04-14 LAB — COMPREHENSIVE METABOLIC PANEL
ALT: 60 U/L — ABNORMAL HIGH (ref 0–44)
AST: 307 U/L — ABNORMAL HIGH (ref 15–41)
Albumin: 3 g/dL — ABNORMAL LOW (ref 3.5–5.0)
Alkaline Phosphatase: 92 U/L (ref 38–126)
Anion gap: 10 (ref 5–15)
BUN: <5 mg/dL — ABNORMAL LOW (ref 6–20)
CO2: 25 mmol/L (ref 22–32)
Calcium: 9 mg/dL (ref 8.9–10.3)
Chloride: 104 mmol/L (ref 98–111)
Creatinine, Ser: 0.35 mg/dL — ABNORMAL LOW (ref 0.44–1.00)
GFR, Estimated: >60 mL/min (ref >60)
Glucose, Bld: 121 mg/dL — ABNORMAL HIGH (ref 70–99)
Potassium: 4.1 mmol/L (ref 3.5–5.1)
Sodium: 139 mmol/L (ref 135–145)
Total Bilirubin: 1.8 mg/dL — ABNORMAL HIGH (ref 0.3–1.2)
Total Protein: 7.1 g/dL (ref 6.5–8.1)

## 2021-04-14 LAB — C-REACTIVE PROTEIN: CRP: 1.6 mg/dL — ABNORMAL HIGH (ref <1.0)

## 2021-04-14 LAB — D-DIMER, QUANTITATIVE: D-Dimer, Quant: 0.37 ug/mL-FEU (ref 0.00–0.50)

## 2021-04-14 LAB — FERRITIN: Ferritin: 39 ng/mL (ref 11–307)

## 2021-04-14 MED ORDER — LINACLOTIDE 145 MCG PO CAPS
290.0000 ug | ORAL_CAPSULE | Freq: Every day | ORAL | Status: DC
  Administered 2021-04-14 – 2021-04-17 (×4): 290 ug via ORAL
  Filled 2021-04-14 (×4): qty 2

## 2021-04-14 NOTE — Progress Notes (Signed)
Patient complained of pain in her rectum, she said "it's feels something is stock in there and can't come out". This RN offers to dig stim her rectum to get some of the BM out but patient refused. Patient educated. We continue to monitor.

## 2021-04-14 NOTE — Progress Notes (Signed)
PROGRESS NOTE    Carla Little  [id number] DOB: Jul 14, 1990 DOA: 04/11/2021 PCP: Patient, No Pcp Per (Inactive)    Brief Narrative:  31 year old female with recent diagnosis of Guillain-Barr syndrome, morbid obesity, currently a resident of a skilled nursing facility for rehab, admitted to the hospital with shortness of breath and hypoxia.  Found to have COVID-19 pneumonia.   Assessment & Plan:   Principal Problem:   Pneumonia due to COVID-19 virus Active Problems:   Nonalcoholic steatohepatitis (NASH)   Class 3 obesity (HCC)   Hypokalemia   GERD (gastroesophageal reflux disease)   Depression   Guillain Barr syndrome (HCC)   Hypomagnesemia   Acute respiratory failure with hypoxia -Secondary to COVID-19 pneumonia -Requiring 3 L of oxygen on admission -Currently has been weaned down to room air -Of note, during her last hospitalization, she was noted to have nocturnal hypoxia and was recommended to have outpatient sleep study   COVID-19 pneumonia -Since she would be considered high risk due to her comorbidities, will treat with IV remdesivir -Started on steroids since she is hypoxic -Continue albuterol and Mucinex -Inflammatory markers mildly elevated on admission, but trending down -Procalcitonin negative -D-dimer negative   Nausea and vomiting -She did have similar episodes during her last hospitalization, but reports improvement -Her current symptoms recurred over the past week -Treat supportively with antiemetics -Appears to be tolerating clear liquids, will advance to full liquids   Constipation -Reports that she has not had a bowel movement in over a week -She has been started on MiraLAX twice a day -She has had difficulty tolerating smog enema -On her previous admission, she was noted to have proctitis. -She had flex sig performed in 12/2020 that showed internal hemorrhoids and large amounts of stool -Start the patient on Anusol  suppositories -Started on Linzess today -We will try and coordinate with nursing staff to see if she can be manually disimpacted today.  This was discussed with the patient was also agreeable   Hypokalemia/hypomagnesemia -Related to vomiting and decreased p.o. intake -This is being replaced   Depression/anxiety -Continue outpatient dose of Wellbutrin and BuSpar -She is also on Depakote   Morbid obesity -Increased risk of morbidity and mortality with her current illness.   Recent Guillain-Barr diagnosis -Continue physical therapy     DVT prophylaxis:   Lovenox  Code Status: Full code Family Communication: Discussed with patient Disposition Plan: Status is: Inpatient  Remains inpatient appropriate because: Management of COVID-19 pneumonia, nausea and vomiting         Consultants:    Procedures:    Antimicrobials:      Subjective: She is feeling nauseous today.  She has not had any vomiting.  Notes bowel movement as of yet.  She has not had a bowel movement yet.  Had difficulty tolerating tapwater enema yesterday.  Notes indicate that several nurses offered to manually disimpact, but patient refused.  She says that she did have the urge to possibly pass her stool overnight.  She did try straining, but it does not appear that she had any significant stool output.  Objective: Vitals:   04/14/21 0750 04/14/21 1223 04/14/21 1647 04/14/21 1950  BP:  117/72    Pulse:  99    Resp:  18    Temp:  98.2 F (36.8 C)    TempSrc:  Oral    SpO2: 92% 92%  91%  Weight:   (!) 185.2 kg   Height:   5\' 5"  (1.651 m)  Intake/Output Summary (Last 24 hours) at 04/14/2021 2004 Last data filed at 04/14/2021 1937 Gross per 24 hour  Intake 0.11 ml  Output 600 ml  Net -599.89 ml   Filed Weights   04/14/21 1647  Weight: (!) 185.2 kg    Examination:  General exam: Appears calm and comfortable  Respiratory system: Clear to auscultation. Respiratory effort  normal. Cardiovascular system: S1 & S2 heard, RRR. No JVD, murmurs, rubs, gallops or clicks. No pedal edema. Gastrointestinal system: Abdomen is nondistended, soft and nontender. No organomegaly or masses felt. Normal bowel sounds heard. Central nervous system: Alert and oriented. No focal neurological deficits. Extremities: Symmetric 5 x 5 power. Skin: No rashes, lesions or ulcers Psychiatry: Judgement and insight appear normal. Mood & affect appropriate.     Data Reviewed: I have personally reviewed following labs and imaging studies  CBC: Recent Labs  Lab 04/09/21 1517 04/11/21 2301 04/12/21 1207 04/13/21 0601 04/14/21 0519  WBC 7.6 8.1 5.1 6.1 8.2  NEUTROABS 4.2 4.7 3.7 4.3 6.3  HGB 12.0 11.2* 11.4* 10.6* 11.5*  HCT 37.4 35.0* 34.9* 34.5* 35.6*  MCV 83.5 83.1 83.3 81.4 83.4  PLT 359 369 374 449* [id number]*   Basic Metabolic Panel: Recent Labs  Lab 04/09/21 1517 04/11/21 2301 04/12/21 1207 04/12/21 1446 04/13/21 0601 04/14/21 0519  NA 135 138  --   --  139 139  K 3.7 2.5*  --  3.3* 3.8 4.1  CL 98 102  --   --  104 104  CO2 28 26  --   --  25 25  GLUCOSE 90 77  --   --  115* 121*  BUN 5* <5*  --   --  <5* <5*  CREATININE 0.48 <0.30* 0.33*  --  0.32* 0.35*  CALCIUM 8.2* 8.0*  --   --  8.4* 9.0  MG  --  1.6*  --  1.9 1.9  --    GFR: Estimated Creatinine Clearance: 172.6 mL/min (A) (by C-G formula based on SCr of 0.35 mg/dL (L)). Liver Function Tests: Recent Labs  Lab 04/09/21 1517 04/11/21 2301 04/13/21 0601 04/14/21 0519  AST 228* 173* 211* 307*  ALT 49* 37 41 60*  ALKPHOS 81 78 82 92  BILITOT 1.2 1.9* 1.6* 1.8*  PROT 7.3 6.8 6.7 7.1  ALBUMIN 2.9* 2.7* 2.8* 3.0*   Recent Labs  Lab 04/09/21 1517  LIPASE 24   No results for input(s): AMMONIA in the last 168 hours. Coagulation Profile: No results for input(s): INR, PROTIME in the last 168 hours. Cardiac Enzymes: No results for input(s): CKTOTAL, CKMB, CKMBINDEX, TROPONINI in the last 168 hours. BNP  (last 3 results) No results for input(s): PROBNP in the last 8760 hours. HbA1C: No results for input(s): HGBA1C in the last 72 hours. CBG: No results for input(s): GLUCAP in the last 168 hours. Lipid Profile: No results for input(s): CHOL, HDL, LDLCALC, TRIG, CHOLHDL, LDLDIRECT in the last 72 hours. Thyroid Function Tests: No results for input(s): TSH, T4TOTAL, FREET4, T3FREE, THYROIDAB in the last 72 hours. Anemia Panel: Recent Labs    04/13/21 0601 04/14/21 0519  FERRITIN 34 39   Sepsis Labs: Recent Labs  Lab 04/12/21 1207  PROCALCITON <0.10    Recent Results (from the past 240 hour(s))  Resp Panel by RT-PCR (Flu A&B, Covid) Nasopharyngeal Swab     Status: Abnormal   Collection Time: 04/09/21  5:14 PM   Specimen: Nasopharyngeal Swab; Nasopharyngeal(NP) swabs in vial transport medium  Result Value Ref Range  Status   SARS Coronavirus 2 by RT PCR POSITIVE (A) NEGATIVE Final    Comment: (NOTE) SARS-CoV-2 target nucleic acids are DETECTED.  The SARS-CoV-2 RNA is generally detectable in upper respiratory specimens during the acute phase of infection. Positive results are indicative of the presence of the identified virus, but do not rule out bacterial infection or co-infection with other pathogens not detected by the test. Clinical correlation with patient history and other diagnostic information is necessary to determine patient infection status. The expected result is Negative.  Fact Sheet for Patients: EntrepreneurPulse.com.au  Fact Sheet for Healthcare Providers: IncredibleEmployment.be  This test is not yet approved or cleared by the [place] FDA and  has been authorized for detection and/or diagnosis of SARS-CoV-2 by FDA under an Emergency Use Authorization (EUA).  This EUA will remain in effect (meaning this test can be used) for the duration of  the COVID-19 declaration under Section 564(b)(1) of the A ct, 21 U.S.C.  section 360bbb-3(b)(1), unless the authorization is terminated or revoked sooner.     Influenza A by PCR NEGATIVE NEGATIVE Final   Influenza B by PCR NEGATIVE NEGATIVE Final    Comment: (NOTE) The Xpert Xpress SARS-CoV-2/FLU/RSV plus assay is intended as an aid in the diagnosis of influenza from Nasopharyngeal swab specimens and should not be used as a sole basis for treatment. Nasal washings and aspirates are unacceptable for Xpert Xpress SARS-CoV-2/FLU/RSV testing.  Fact Sheet for Patients: EntrepreneurPulse.com.au  Fact Sheet for Healthcare Providers: IncredibleEmployment.be  This test is not yet approved or cleared by the [place] FDA and has been authorized for detection and/or diagnosis of SARS-CoV-2 by FDA under an Emergency Use Authorization (EUA). This EUA will remain in effect (meaning this test can be used) for the duration of the COVID-19 declaration under Section 564(b)(1) of the Act, 21 U.S.C. section 360bbb-3(b)(1), unless the authorization is terminated or revoked.  Performed at [facility], [address], [place], [place] [address]          Radiology Studies: No results found.      Scheduled Meds:  albuterol  2 puff Inhalation BID   bisacodyl  10 mg Rectal Once   buPROPion  150 mg Oral Daily   busPIRone  5 mg Oral BID   divalproex  125 mg Oral BID   enoxaparin (LOVENOX) injection  90 mg Subcutaneous Daily   folic acid  1 mg Oral Daily   hydrocortisone  25 mg Rectal BID   lidocaine  1 patch Transdermal Daily   linaclotide  290 mcg Oral QAC breakfast   melatonin  9 mg Oral QHS   methylPREDNISolone (SOLU-MEDROL) injection  0.5 mg/kg Intravenous Q12H   Followed by   Derrill Memo ON 04/15/2021] predniSONE  50 mg Oral Daily   metoprolol tartrate  12.5 mg Oral BID   pantoprazole  40 mg Oral BID AC   polyethylene glycol  17 g Oral BID   potassium chloride  40 mEq Oral BID   pregabalin  75 mg Oral TID    senna-docusate  1 tablet Oral BID   sorbitol, milk of mag, mineral oil, glycerin (SMOG) enema  960 mL Rectal Once   Continuous Infusions:  remdesivir 100 mg in NS 100 mL Stopped (04/14/21 0927)     LOS: 2 days    Time spent: 56mins    Kathie Dike, MD [facility]   If 7PM-7AM, please contact night-coverage [email or web address]  04/14/2021, 8:04 PM

## 2021-04-15 LAB — CBC WITH DIFFERENTIAL/PLATELET
Abs Immature Granulocytes: 0.1 10*3/uL — ABNORMAL HIGH (ref 0.00–0.07)
Basophils Absolute: 0 10*3/uL (ref 0.0–0.1)
Basophils Relative: 0 %
Eosinophils Absolute: 0 10*3/uL (ref 0.0–0.5)
Eosinophils Relative: 0 %
HCT: 36.2 % (ref 36.0–46.0)
Hemoglobin: 11.6 g/dL — ABNORMAL LOW (ref 12.0–15.0)
Immature Granulocytes: 1 %
Lymphocytes Relative: 18 %
Lymphs Abs: 1.4 10*3/uL (ref 0.7–4.0)
MCH: 26.8 pg (ref 26.0–34.0)
MCHC: 32 g/dL (ref 30.0–36.0)
MCV: 83.6 fL (ref 80.0–100.0)
Monocytes Absolute: 0.4 10*3/uL (ref 0.1–1.0)
Monocytes Relative: 5 %
Neutro Abs: 5.6 10*3/uL (ref 1.7–7.7)
Neutrophils Relative %: 76 %
Platelets: 572 10*3/uL — ABNORMAL HIGH (ref 150–400)
RBC: 4.33 MIL/uL (ref 3.87–5.11)
RDW: 21.8 % — ABNORMAL HIGH (ref 11.5–15.5)
WBC: 7.5 10*3/uL (ref 4.0–10.5)
nRBC: 0 % (ref 0.0–0.2)

## 2021-04-15 LAB — URINALYSIS, ROUTINE W REFLEX MICROSCOPIC
Bilirubin Urine: NEGATIVE
Glucose, UA: NEGATIVE mg/dL
Ketones, ur: NEGATIVE mg/dL
Nitrite: POSITIVE — AB
Protein, ur: 30 mg/dL — AB
Specific Gravity, Urine: 1.018 (ref 1.005–1.030)
pH: 7 (ref 5.0–8.0)

## 2021-04-15 LAB — COMPREHENSIVE METABOLIC PANEL
ALT: 67 U/L — ABNORMAL HIGH (ref 0–44)
AST: 267 U/L — ABNORMAL HIGH (ref 15–41)
Albumin: 3 g/dL — ABNORMAL LOW (ref 3.5–5.0)
Alkaline Phosphatase: 87 U/L (ref 38–126)
Anion gap: 8 (ref 5–15)
BUN: 6 mg/dL (ref 6–20)
CO2: 25 mmol/L (ref 22–32)
Calcium: 9.2 mg/dL (ref 8.9–10.3)
Chloride: 105 mmol/L (ref 98–111)
Creatinine, Ser: 0.41 mg/dL — ABNORMAL LOW (ref 0.44–1.00)
GFR, Estimated: >60 mL/min (ref >60)
Glucose, Bld: 125 mg/dL — ABNORMAL HIGH (ref 70–99)
Potassium: 4.3 mmol/L (ref 3.5–5.1)
Sodium: 138 mmol/L (ref 135–145)
Total Bilirubin: 2.1 mg/dL — ABNORMAL HIGH (ref 0.3–1.2)
Total Protein: 7.2 g/dL (ref 6.5–8.1)

## 2021-04-15 LAB — FERRITIN: Ferritin: 34 ng/mL (ref 11–307)

## 2021-04-15 LAB — D-DIMER, QUANTITATIVE: D-Dimer, Quant: 0.37 ug/mL-FEU (ref 0.00–0.50)

## 2021-04-15 LAB — C-REACTIVE PROTEIN: CRP: 1.4 mg/dL — ABNORMAL HIGH (ref <1.0)

## 2021-04-15 MED ORDER — SODIUM CHLORIDE 0.9 % IV SOLN: INTRAVENOUS | Status: DC | PRN

## 2021-04-15 MED ORDER — PREDNISONE 20 MG PO TABS
40.0000 mg | ORAL_TABLET | Freq: Every day | ORAL | Status: DC
  Administered 2021-04-16: 40 mg via ORAL
  Filled 2021-04-15: qty 2

## 2021-04-15 MED ORDER — SODIUM CHLORIDE 0.9 % IV SOLN
1.0000 g | INTRAVENOUS | Status: DC
  Administered 2021-04-15 – 2021-04-17 (×3): 1 g via INTRAVENOUS
  Filled 2021-04-15 (×3): qty 10

## 2021-04-15 MED ORDER — SODIUM CHLORIDE 0.9 % IV SOLN: INTRAVENOUS | Status: DC

## 2021-04-15 MED ORDER — ENOXAPARIN SODIUM 100 MG/ML IJ SOSY
90.0000 mg | PREFILLED_SYRINGE | INTRAMUSCULAR | Status: DC
  Administered 2021-04-16 – 2021-04-17 (×2): 90 mg via SUBCUTANEOUS
  Filled 2021-04-15 (×2): qty 1

## 2021-04-15 NOTE — Progress Notes (Signed)
PROGRESS NOTE    Carla Little  [id number] DOB: 1990-07-09 DOA: 04/11/2021 PCP: Patient, No Pcp Per (Inactive)    Brief Narrative:  31 year old female with recent diagnosis of Guillain-Barr syndrome, morbid obesity, currently a resident of a skilled nursing facility for rehab, admitted to the hospital with shortness of breath and hypoxia.  Found to have COVID-19 pneumonia.  Treated with remdesivir and steroids.  Respiratory status has been stable.  She has had persistent constipation despite several bowel agents.  She was started on Linzess and is now having bowel movements.  Diet is being advanced and she was having vomiting.  She also complained of dysuria and UA indicated possible infection.   Assessment & Plan:   Principal Problem:   Pneumonia due to COVID-19 virus Active Problems:   Nonalcoholic steatohepatitis (NASH)   Class 3 obesity (HCC)   Hypokalemia   GERD (gastroesophageal reflux disease)   Depression   Guillain Barr syndrome (HCC)   Hypomagnesemia   Acute respiratory failure with hypoxia -Secondary to COVID-19 pneumonia -Requiring 3 L of oxygen on admission -Currently has been weaned down to room air -Of note, during her last hospitalization, she was noted to have nocturnal hypoxia and was recommended to have outpatient sleep study   COVID-19 pneumonia -Since she would be considered high risk due to her comorbidities, she was treated with remdesivir -Started on steroids since she is hypoxic.  Currently on prednisone taper -Continue albuterol and Mucinex -Inflammatory markers mildly elevated on admission, but trending down -Procalcitonin negative -D-dimer negative   Nausea and vomiting -She did have similar episodes during her last hospitalization, but reports improvement -Her current symptoms recurred over the past week -Treat supportively with antiemetics -tolerating full liquids, advanced to soft foods   Constipation -Reports that she has not  had a bowel movement in over a week -started on linzess -She had bowel movement on 1/21    Hypokalemia/hypomagnesemia -Related to vomiting and decreased p.o. intake -This is being replaced   Depression/anxiety -Continue outpatient dose of Wellbutrin and BuSpar -She is also on Depakote   Morbid obesity -Increased risk of morbidity and mortality with her current illness.   Recent Guillain-Barr diagnosis -Continue physical therapy     DVT prophylaxis:   Lovenox  Code Status: Full code Family Communication: Discussed with patient Disposition Plan: Status is: Inpatient  Remains inpatient appropriate because: Management of COVID-19 pneumonia, nausea and vomiting         Consultants:    Procedures:    Antimicrobials:  Ceftriaxone 1/21>    Subjective: She reports having a bowel movement last night. Overall nausea is better. She is requesting solid food. She describes burning in urine.   Objective: Vitals:   04/14/21 1950 04/14/21 2133 04/15/21 0450 04/15/21 0815  BP:  105/69 122/72   Pulse:  (!) 109 83   Resp:  18 18   Temp:  99 F (37.2 C) 97.7 F (36.5 C)   TempSrc:  Oral    SpO2: 91% 95% 96% 93%  Weight:      Height:        Intake/Output Summary (Last 24 hours) at 04/15/2021 1819 Last data filed at 04/15/2021 1816 Gross per 24 hour  Intake 1508.75 ml  Output 71 ml  Net 1437.75 ml   Filed Weights   04/14/21 1647  Weight: (!) 185.2 kg    Examination:  General exam: Alert, awake, oriented x 3 Respiratory system: Clear to auscultation. Respiratory effort normal. Cardiovascular system:RRR. No murmurs, rubs, gallops.  Gastrointestinal system: Abdomen is nondistended, soft and nontender. No organomegaly or masses felt. Normal bowel sounds heard. Central nervous system: Alert and oriented. No focal neurological deficits. Extremities: No C/C/E, +pedal pulses Skin: No rashes, lesions or ulcers Psychiatry: Judgement and insight appear normal. Mood &  affect appropriate.      Data Reviewed: I have personally reviewed following labs and imaging studies  CBC: Recent Labs  Lab 04/11/21 2301 04/12/21 1207 04/13/21 0601 04/14/21 0519 04/15/21 0438  WBC 8.1 5.1 6.1 8.2 7.5  NEUTROABS 4.7 3.7 4.3 6.3 5.6  HGB 11.2* 11.4* 10.6* 11.5* 11.6*  HCT 35.0* 34.9* 34.5* 35.6* 36.2  MCV 83.1 83.3 81.4 83.4 83.6  PLT 369 374 449* 547* 0000000*   Basic Metabolic Panel: Recent Labs  Lab 04/09/21 1517 04/11/21 2301 04/12/21 1207 04/12/21 1446 04/13/21 0601 04/14/21 0519 04/15/21 0438  NA 135 138  --   --  139 139 138  K 3.7 2.5*  --  3.3* 3.8 4.1 4.3  CL 98 102  --   --  104 104 105  CO2 28 26  --   --  25 25 25   GLUCOSE 90 77  --   --  115* 121* 125*  BUN 5* <5*  --   --  <5* <5* 6  CREATININE 0.48 <0.30* 0.33*  --  0.32* 0.35* 0.41*  CALCIUM 8.2* 8.0*  --   --  8.4* 9.0 9.2  MG  --  1.6*  --  1.9 1.9  --   --    GFR: Estimated Creatinine Clearance: 172.6 mL/min (A) (by C-G formula based on SCr of 0.41 mg/dL (L)). Liver Function Tests: Recent Labs  Lab 04/09/21 1517 04/11/21 2301 04/13/21 0601 04/14/21 0519 04/15/21 0438  AST 228* 173* 211* 307* 267*  ALT 49* 37 41 60* 67*  ALKPHOS 81 78 82 92 87  BILITOT 1.2 1.9* 1.6* 1.8* 2.1*  PROT 7.3 6.8 6.7 7.1 7.2  ALBUMIN 2.9* 2.7* 2.8* 3.0* 3.0*   Recent Labs  Lab 04/09/21 1517  LIPASE 24   No results for input(s): AMMONIA in the last 168 hours. Coagulation Profile: No results for input(s): INR, PROTIME in the last 168 hours. Cardiac Enzymes: No results for input(s): CKTOTAL, CKMB, CKMBINDEX, TROPONINI in the last 168 hours. BNP (last 3 results) No results for input(s): PROBNP in the last 8760 hours. HbA1C: No results for input(s): HGBA1C in the last 72 hours. CBG: No results for input(s): GLUCAP in the last 168 hours. Lipid Profile: No results for input(s): CHOL, HDL, LDLCALC, TRIG, CHOLHDL, LDLDIRECT in the last 72 hours. Thyroid Function Tests: No results for  input(s): TSH, T4TOTAL, FREET4, T3FREE, THYROIDAB in the last 72 hours. Anemia Panel: Recent Labs    04/14/21 0519 04/15/21 0438  FERRITIN 39 34   Sepsis Labs: Recent Labs  Lab 04/12/21 1207  PROCALCITON <0.10    Recent Results (from the past 240 hour(s))  Resp Panel by RT-PCR (Flu A&B, Covid) Nasopharyngeal Swab     Status: Abnormal   Collection Time: 04/09/21  5:14 PM   Specimen: Nasopharyngeal Swab; Nasopharyngeal(NP) swabs in vial transport medium  Result Value Ref Range Status   SARS Coronavirus 2 by RT PCR POSITIVE (A) NEGATIVE Final    Comment: (NOTE) SARS-CoV-2 target nucleic acids are DETECTED.  The SARS-CoV-2 RNA is generally detectable in upper respiratory specimens during the acute phase of infection. Positive results are indicative of the presence of the identified virus, but do not rule out bacterial infection  or co-infection with other pathogens not detected by the test. Clinical correlation with patient history and other diagnostic information is necessary to determine patient infection status. The expected result is Negative.  Fact Sheet for Patients: EntrepreneurPulse.com.au  Fact Sheet for Healthcare Providers: IncredibleEmployment.be  This test is not yet approved or cleared by the [place] FDA and  has been authorized for detection and/or diagnosis of SARS-CoV-2 by FDA under an Emergency Use Authorization (EUA).  This EUA will remain in effect (meaning this test can be used) for the duration of  the COVID-19 declaration under Section 564(b)(1) of the A ct, 21 U.S.C. section 360bbb-3(b)(1), unless the authorization is terminated or revoked sooner.     Influenza A by PCR NEGATIVE NEGATIVE Final   Influenza B by PCR NEGATIVE NEGATIVE Final    Comment: (NOTE) The Xpert Xpress SARS-CoV-2/FLU/RSV plus assay is intended as an aid in the diagnosis of influenza from Nasopharyngeal swab specimens and should not be  used as a sole basis for treatment. Nasal washings and aspirates are unacceptable for Xpert Xpress SARS-CoV-2/FLU/RSV testing.  Fact Sheet for Patients: EntrepreneurPulse.com.au  Fact Sheet for Healthcare Providers: IncredibleEmployment.be  This test is not yet approved or cleared by the [place] FDA and has been authorized for detection and/or diagnosis of SARS-CoV-2 by FDA under an Emergency Use Authorization (EUA). This EUA will remain in effect (meaning this test can be used) for the duration of the COVID-19 declaration under Section 564(b)(1) of the Act, 21 U.S.C. section 360bbb-3(b)(1), unless the authorization is terminated or revoked.  Performed at [facility], [address] [place],  [address]          Radiology Studies: No results found.      Scheduled Meds:  albuterol  2 puff Inhalation BID   buPROPion  150 mg Oral Daily   busPIRone  5 mg Oral BID   divalproex  125 mg Oral BID   enoxaparin (LOVENOX) injection  90 mg Subcutaneous Daily   folic acid  1 mg Oral Daily   hydrocortisone  25 mg Rectal BID   lidocaine  1 patch Transdermal Daily   linaclotide  290 mcg Oral QAC breakfast   melatonin  9 mg Oral QHS   metoprolol tartrate  12.5 mg Oral BID   pantoprazole  40 mg Oral BID AC   polyethylene glycol  17 g Oral BID   potassium chloride  40 mEq Oral BID   predniSONE  50 mg Oral Daily   pregabalin  75 mg Oral TID   senna-docusate  1 tablet Oral BID   Continuous Infusions:  sodium chloride 10 mL/hr at 04/15/21 1014   sodium chloride 75 mL/hr at 04/15/21 1534   cefTRIAXone (ROCEPHIN)  IV Stopped (04/15/21 1606)     LOS: 3 days    Time spent: 60mins    Kathie Dike, MD [facility]   If 7PM-7AM, please contact night-coverage [email or web address]  04/15/2021, 6:19 PM

## 2021-04-15 NOTE — Plan of Care (Signed)

## 2021-04-15 NOTE — Progress Notes (Signed)
Does not want to return to [place] as she feels that she doesn't get needed rehab.  Contacted Chrystal , case worker and Dr. Roderic Palau

## 2021-04-16 LAB — COMPREHENSIVE METABOLIC PANEL
ALT: 78 U/L — ABNORMAL HIGH (ref 0–44)
AST: 292 U/L — ABNORMAL HIGH (ref 15–41)
Albumin: 2.4 g/dL — ABNORMAL LOW (ref 3.5–5.0)
Alkaline Phosphatase: 84 U/L (ref 38–126)
Anion gap: 7 (ref 5–15)
BUN: 7 mg/dL (ref 6–20)
CO2: 24 mmol/L (ref 22–32)
Calcium: 8 mg/dL — ABNORMAL LOW (ref 8.9–10.3)
Chloride: 110 mmol/L (ref 98–111)
Creatinine, Ser: 0.34 mg/dL — ABNORMAL LOW (ref 0.44–1.00)
GFR, Estimated: >60 mL/min (ref >60)
Glucose, Bld: 90 mg/dL (ref 70–99)
Potassium: 3.4 mmol/L — ABNORMAL LOW (ref 3.5–5.1)
Sodium: 141 mmol/L (ref 135–145)
Total Bilirubin: 1.7 mg/dL — ABNORMAL HIGH (ref 0.3–1.2)
Total Protein: 5.8 g/dL — ABNORMAL LOW (ref 6.5–8.1)

## 2021-04-16 LAB — CBC
HCT: 32.4 % — ABNORMAL LOW (ref 36.0–46.0)
Hemoglobin: 10.2 g/dL — ABNORMAL LOW (ref 12.0–15.0)
MCH: 26.6 pg (ref 26.0–34.0)
MCHC: 31.5 g/dL (ref 30.0–36.0)
MCV: 84.6 fL (ref 80.0–100.0)
Platelets: 500 10*3/uL — ABNORMAL HIGH (ref 150–400)
RBC: 3.83 MIL/uL — ABNORMAL LOW (ref 3.87–5.11)
RDW: 22.2 % — ABNORMAL HIGH (ref 11.5–15.5)
WBC: 8.9 10*3/uL (ref 4.0–10.5)
nRBC: 0 % (ref 0.0–0.2)

## 2021-04-16 MED ORDER — GERHARDT'S BUTT CREAM
TOPICAL_CREAM | CUTANEOUS | Status: DC | PRN
  Filled 2021-04-16: qty 1

## 2021-04-16 MED ORDER — PREDNISONE 20 MG PO TABS
30.0000 mg | ORAL_TABLET | Freq: Every day | ORAL | Status: DC
  Administered 2021-04-17: 30 mg via ORAL
  Filled 2021-04-16: qty 1

## 2021-04-16 NOTE — Progress Notes (Signed)
PROGRESS NOTE    Carla Little  [id number] DOB: 12/04/1990 DOA: 04/11/2021 PCP: Patient, No Pcp Per (Inactive)    Brief Narrative:  31 year old female with recent diagnosis of Guillain-Barr syndrome, morbid obesity, currently a resident of a skilled nursing facility for rehab, admitted to the hospital with shortness of breath and hypoxia.  Found to have COVID-19 pneumonia.  Treated with remdesivir and steroids.  Respiratory status has been stable.  She has had persistent constipation despite several bowel agents.  She was started on Linzess and is now having bowel movements.  Diet is being advanced and she was having vomiting.  She also complained of dysuria and UA indicated possible infection.   Assessment & Plan:   Principal Problem:   Pneumonia due to COVID-19 virus Active Problems:   Nonalcoholic steatohepatitis (NASH)   Class 3 obesity (HCC)   Hypokalemia   GERD (gastroesophageal reflux disease)   Depression   Guillain Barr syndrome (HCC)   Hypomagnesemia   Acute respiratory failure with hypoxia -Secondary to COVID-19 pneumonia -Requiring 3 L of oxygen on admission -Currently has been weaned down to room air -Of note, during her last hospitalization, she was noted to have nocturnal hypoxia and was recommended to have outpatient sleep study   COVID-19 pneumonia -Since she would be considered high risk due to her comorbidities, she was treated with remdesivir -Started on steroids since she is hypoxic.  Currently on prednisone taper -Continue albuterol and Mucinex -Inflammatory markers mildly elevated on admission, but trending down -Procalcitonin negative -D-dimer negative   Nausea and vomiting -She did have similar episodes during her last hospitalization, but reports improvement -Her current symptoms recurred over the past week -Treat supportively with antiemetics -tolerating  soft foods now   Constipation -Reports that she has not had a bowel movement in  over a week -started on linzess -She had bowel movement on 1/21 and 1/22    Hypokalemia/hypomagnesemia -Related to vomiting and decreased p.o. intake -This is being replaced   Depression/anxiety -Continue outpatient dose of Wellbutrin and BuSpar -She is also on Depakote   Morbid obesity -Increased risk of morbidity and mortality with her current illness.   Recent Guillain-Barr diagnosis -Continue physical therapy  UTI -complained of dysuria -UA with possible UTI -urine culture in process -on ceftriaxone     DVT prophylaxis:   Lovenox  Code Status: Full code Family Communication: Discussed with patient Disposition Plan: Status is: Inpatient  Remains inpatient appropriate because: Management of COVID-19 pneumonia, nausea and vomiting         Consultants:    Procedures:    Antimicrobials:  Ceftriaxone 1/21>    Subjective: She is having bowel movements with linzess. Had some nausea and vomiting after eating solid food yesterday, but tolerated breakfast this morning  Objective: Vitals:   04/15/21 2113 04/16/21 0522 04/16/21 0747 04/16/21 1408  BP: 111/68 (!) 102/59  126/83  Pulse: (!) 110 (!) 103  95  Resp: 18 18  18   Temp: 98.3 F (36.8 C) 98.2 F (36.8 C)  97.6 F (36.4 C)  TempSrc: Oral Oral  Oral  SpO2: (!) 89% 95% 96% 93%  Weight:      Height:        Intake/Output Summary (Last 24 hours) at 04/16/2021 1851 Last data filed at 04/16/2021 1700 Gross per 24 hour  Intake 2016.25 ml  Output --  Net 2016.25 ml   Filed Weights   04/14/21 1647  Weight: (!) 185.2 kg    Examination:  General exam:  Alert, awake, oriented x 3 Respiratory system: Clear to auscultation. Respiratory effort normal. Cardiovascular system:RRR. No murmurs, rubs, gallops. Gastrointestinal system: Abdomen is nondistended, soft and nontender. No organomegaly or masses felt. Normal bowel sounds heard. Central nervous system: Alert and oriented. No focal neurological  deficits. Extremities: No C/C/E, +pedal pulses Skin: No rashes, lesions or ulcers Psychiatry: Judgement and insight appear normal. Mood & affect appropriate.      Data Reviewed: I have personally reviewed following labs and imaging studies  CBC: Recent Labs  Lab 04/11/21 2301 04/12/21 1207 04/13/21 0601 04/14/21 0519 04/15/21 0438 04/16/21 0558  WBC 8.1 5.1 6.1 8.2 7.5 8.9  NEUTROABS 4.7 3.7 4.3 6.3 5.6  --   HGB 11.2* 11.4* 10.6* 11.5* 11.6* 10.2*  HCT 35.0* 34.9* 34.5* 35.6* 36.2 32.4*  MCV 83.1 83.3 81.4 83.4 83.6 84.6  PLT 369 374 449* 547* 572* [id number]*   Basic Metabolic Panel: Recent Labs  Lab 04/11/21 2301 04/12/21 1207 04/12/21 1446 04/13/21 0601 04/14/21 0519 04/15/21 0438 04/16/21 0558  NA 138  --   --  139 139 138 141  K 2.5*  --  3.3* 3.8 4.1 4.3 3.4*  CL 102  --   --  104 104 105 110  CO2 26  --   --  25 25 25 24   GLUCOSE 77  --   --  115* 121* 125* 90  BUN <5*  --   --  <5* <5* 6 7  CREATININE <0.30* 0.33*  --  0.32* 0.35* 0.41* 0.34*  CALCIUM 8.0*  --   --  8.4* 9.0 9.2 8.0*  MG 1.6*  --  1.9 1.9  --   --   --    GFR: Estimated Creatinine Clearance: 172.6 mL/min (A) (by C-G formula based on SCr of 0.34 mg/dL (L)). Liver Function Tests: Recent Labs  Lab 04/11/21 2301 04/13/21 0601 04/14/21 0519 04/15/21 0438 04/16/21 0558  AST 173* 211* 307* 267* 292*  ALT 37 41 60* 67* 78*  ALKPHOS 78 82 92 87 84  BILITOT 1.9* 1.6* 1.8* 2.1* 1.7*  PROT 6.8 6.7 7.1 7.2 5.8*  ALBUMIN 2.7* 2.8* 3.0* 3.0* 2.4*   No results for input(s): LIPASE, AMYLASE in the last 168 hours.  No results for input(s): AMMONIA in the last 168 hours. Coagulation Profile: No results for input(s): INR, PROTIME in the last 168 hours. Cardiac Enzymes: No results for input(s): CKTOTAL, CKMB, CKMBINDEX, TROPONINI in the last 168 hours. BNP (last 3 results) No results for input(s): PROBNP in the last 8760 hours. HbA1C: No results for input(s): HGBA1C in the last 72  hours. CBG: No results for input(s): GLUCAP in the last 168 hours. Lipid Profile: No results for input(s): CHOL, HDL, LDLCALC, TRIG, CHOLHDL, LDLDIRECT in the last 72 hours. Thyroid Function Tests: No results for input(s): TSH, T4TOTAL, FREET4, T3FREE, THYROIDAB in the last 72 hours. Anemia Panel: Recent Labs    04/14/21 0519 04/15/21 0438  FERRITIN 39 34   Sepsis Labs: Recent Labs  Lab 04/12/21 1207  PROCALCITON <0.10    Recent Results (from the past 240 hour(s))  Resp Panel by RT-PCR (Flu A&B, Covid) Nasopharyngeal Swab     Status: Abnormal   Collection Time: 04/09/21  5:14 PM   Specimen: Nasopharyngeal Swab; Nasopharyngeal(NP) swabs in vial transport medium  Result Value Ref Range Status   SARS Coronavirus 2 by RT PCR POSITIVE (A) NEGATIVE Final    Comment: (NOTE) SARS-CoV-2 target nucleic acids are DETECTED.  The SARS-CoV-2 RNA is  generally detectable in upper respiratory specimens during the acute phase of infection. Positive results are indicative of the presence of the identified virus, but do not rule out bacterial infection or co-infection with other pathogens not detected by the test. Clinical correlation with patient history and other diagnostic information is necessary to determine patient infection status. The expected result is Negative.  Fact Sheet for Patients: EntrepreneurPulse.com.au  Fact Sheet for Healthcare Providers: IncredibleEmployment.be  This test is not yet approved or cleared by the [place] FDA and  has been authorized for detection and/or diagnosis of SARS-CoV-2 by FDA under an Emergency Use Authorization (EUA).  This EUA will remain in effect (meaning this test can be used) for the duration of  the COVID-19 declaration under Section 564(b)(1) of the A ct, 21 U.S.C. section 360bbb-3(b)(1), unless the authorization is terminated or revoked sooner.     Influenza A by PCR NEGATIVE NEGATIVE Final    Influenza B by PCR NEGATIVE NEGATIVE Final    Comment: (NOTE) The Xpert Xpress SARS-CoV-2/FLU/RSV plus assay is intended as an aid in the diagnosis of influenza from Nasopharyngeal swab specimens and should not be used as a sole basis for treatment. Nasal washings and aspirates are unacceptable for Xpert Xpress SARS-CoV-2/FLU/RSV testing.  Fact Sheet for Patients: EntrepreneurPulse.com.au  Fact Sheet for Healthcare Providers: IncredibleEmployment.be  This test is not yet approved or cleared by the [place] FDA and has been authorized for detection and/or diagnosis of SARS-CoV-2 by FDA under an Emergency Use Authorization (EUA). This EUA will remain in effect (meaning this test can be used) for the duration of the COVID-19 declaration under Section 564(b)(1) of the Act, 21 U.S.C. section 360bbb-3(b)(1), unless the authorization is terminated or revoked.  Performed at [facility], [address] [place], [place] [address]          Radiology Studies: No results found.      Scheduled Meds:  albuterol  2 puff Inhalation BID   buPROPion  150 mg Oral Daily   busPIRone  5 mg Oral BID   divalproex  125 mg Oral BID   enoxaparin (LOVENOX) injection  90 mg Subcutaneous [id number]   folic acid  1 mg Oral Daily   hydrocortisone  25 mg Rectal BID   lidocaine  1 patch Transdermal Daily   linaclotide  290 mcg Oral QAC breakfast   melatonin  9 mg Oral QHS   metoprolol tartrate  12.5 mg Oral BID   pantoprazole  40 mg Oral BID AC   polyethylene glycol  17 g Oral BID   potassium chloride  40 mEq Oral BID   [START ON 04/17/2021] predniSONE  30 mg Oral Daily   pregabalin  75 mg Oral TID   senna-docusate  1 tablet Oral BID   Continuous Infusions:  sodium chloride 10 mL/hr at 04/15/21 1014   sodium chloride 75 mL/hr at 04/16/21 1515   cefTRIAXone (ROCEPHIN)  IV 1 g (04/16/21 1518)     LOS: 4 days    Time spent: 23mins    Kathie Dike,  MD [facility]   If 7PM-7AM, please contact night-coverage [email or web address]  04/16/2021, 6:51 PM

## 2021-04-16 NOTE — Progress Notes (Signed)
Had liquid bm.  Cleaned and bathed, new dressing applied to left buttock.  Barrier cream applied to perineal area.  O2 sats are in mid 90's on room air.  Declined to dangle legs on side of bed.

## 2021-04-16 NOTE — Progress Notes (Signed)
Patient is stable, just received prn pain med for left knee pain. Patient refused stool softeners and suppository on night shift d/t having loose stools. Dressings changed to bil buttock with peri care. X3 stools on night shift.

## 2021-04-17 DIAGNOSIS — J9601 Acute respiratory failure with hypoxia: Secondary | ICD-10-CM

## 2021-04-17 LAB — COMPREHENSIVE METABOLIC PANEL
ALT: 98 U/L — ABNORMAL HIGH (ref 0–44)
AST: 300 U/L — ABNORMAL HIGH (ref 15–41)
Albumin: 2.8 g/dL — ABNORMAL LOW (ref 3.5–5.0)
Alkaline Phosphatase: 83 U/L (ref 38–126)
Anion gap: 9 (ref 5–15)
BUN: 7 mg/dL (ref 6–20)
CO2: 27 mmol/L (ref 22–32)
Calcium: 8.9 mg/dL (ref 8.9–10.3)
Chloride: 102 mmol/L (ref 98–111)
Creatinine, Ser: 0.3 mg/dL — ABNORMAL LOW (ref 0.44–1.00)
GFR, Estimated: >60 mL/min (ref >60)
Glucose, Bld: 72 mg/dL (ref 70–99)
Potassium: 3.5 mmol/L (ref 3.5–5.1)
Sodium: 138 mmol/L (ref 135–145)
Total Bilirubin: 2 mg/dL — ABNORMAL HIGH (ref 0.3–1.2)
Total Protein: 6.3 g/dL — ABNORMAL LOW (ref 6.5–8.1)

## 2021-04-17 MED ORDER — LINACLOTIDE 290 MCG PO CAPS: 290.0000 ug | ORAL_CAPSULE | Freq: Every day | ORAL | Status: AC

## 2021-04-17 MED ORDER — GERHARDT'S BUTT CREAM: 1.0000 "application " | TOPICAL_CREAM | CUTANEOUS | Status: DC | PRN

## 2021-04-17 MED ORDER — METOPROLOL TARTRATE 25 MG PO TABS: 12.5000 mg | ORAL_TABLET | Freq: Two times a day (BID) | ORAL | 1 refills | Status: DC

## 2021-04-17 MED ORDER — CIPROFLOXACIN HCL 500 MG PO TABS: 500.0000 mg | ORAL_TABLET | Freq: Two times a day (BID) | ORAL | 0 refills | Status: DC

## 2021-04-17 MED ORDER — PHENAZOPYRIDINE HCL 100 MG PO TABS: 100.0000 mg | ORAL_TABLET | Freq: Three times a day (TID) | ORAL | 0 refills | Status: DC | PRN

## 2021-04-17 MED ORDER — PREDNISONE 10 MG PO TABS: ORAL_TABLET | ORAL | Status: DC

## 2021-04-17 MED ORDER — POLYETHYLENE GLYCOL 3350 17 G PO PACK: 17.0000 g | PACK | Freq: Two times a day (BID) | ORAL | 0 refills | Status: DC

## 2021-04-17 MED ORDER — ALBUTEROL SULFATE HFA 108 (90 BASE) MCG/ACT IN AERS: 1.0000 | INHALATION_SPRAY | Freq: Four times a day (QID) | RESPIRATORY_TRACT | Status: DC | PRN

## 2021-04-17 MED ORDER — GUAIFENESIN-DM 100-10 MG/5ML PO SYRP: 10.0000 mL | ORAL_SOLUTION | ORAL | 0 refills | Status: DC | PRN

## 2021-04-17 MED ORDER — CEFDINIR 300 MG PO CAPS: 600.0000 mg | ORAL_CAPSULE | Freq: Every day | ORAL | 0 refills | Status: DC

## 2021-04-17 MED ORDER — HYDROCORTISONE ACETATE 25 MG RE SUPP: 25.0000 mg | Freq: Two times a day (BID) | RECTAL | 0 refills | Status: DC

## 2021-04-17 NOTE — Progress Notes (Signed)
Physical Therapy Treatment Patient Details Name: Carla Little MRN: [id number] DOB: [birth year] Today's Date: 04/17/2021   History of Present Illness Carla Little is a 31 y.o. female with medical history significant of recent prolonged hospitalization where she was treated for left knee dislocation.  She also developed Guillain-Barr syndrome and was treated with IVIG.  Due to persistent weakness, she was discharged to skilled nursing facility for rehab.  She reports that she is in but has made slow progress and is now able to sit up on the side of the bed.  She is not able to stand as of yet.  She reports that she has had nausea, vomiting and cough for the past week.  Since yesterday, she had increasing shortness of breath.  She did not have any fever.  When checked yesterday, she was noted to be hypoxic.  She reports having difficulty keeping anything down.  She reports being diagnosed with COVID-19 approximately 1 week ago.  She was seen in the ER at that time and discharged back to skilled facility after being treated supportively with IV fluids and antiemetics.    PT Comments    Patient declined functional activity due to c/o  nausea and able to tolerate BLE exercises demonstrating fair/good return for completing BLE ROM/strengthening exercises with verbal cues and occasional tactile assistance for set up.   Recommendations for follow up therapy are one component of a multi-disciplinary discharge planning process, led by the attending physician.  Recommendations may be updated based on patient status, additional functional criteria and insurance authorization.  Follow Up Recommendations  Skilled nursing-short term rehab (<3 hours/day)     Assistance Recommended at Discharge Intermittent Supervision/Assistance  Patient can return home with the following A lot of help with walking and/or transfers;A lot of help with bathing/dressing/bathroom;Assistance with cooking/housework    Equipment Recommendations  None recommended by PT    Recommendations for Other Services       Precautions / Restrictions Precautions Precautions: Fall Restrictions Weight Bearing Restrictions: No     Mobility  Bed Mobility                    Transfers                        Ambulation/Gait                   Stairs             Wheelchair Mobility    Modified Rankin (Stroke Patients Only)       Balance                                            Cognition Arousal/Alertness: Awake/alert Behavior During Therapy: WFL for tasks assessed/performed Overall Cognitive Status: Within Functional Limits for tasks assessed                                          Exercises General Exercises - Lower Extremity Ankle Circles/Pumps: 20 reps, Both, Strengthening, AROM, Supine Short Arc Quad: AROM, Strengthening, Both, 20 reps, Supine Heel Slides: AROM, Strengthening, Both, 20 reps, Supine Hip ABduction/ADduction: AROM, Strengthening, Both, 20 reps, Supine    General Comments  Pertinent Vitals/Pain Pain Assessment Pain Assessment: Faces Faces Pain Scale: Hurts a little bit Pain Location: L knee with movement Pain Descriptors / Indicators: Sore Pain Intervention(s): Limited activity within patient's tolerance, Monitored during session, Repositioned    Home Living                          Prior Function            PT Goals (current goals can now be found in the care plan section) Acute Rehab PT Goals Patient Stated Goal: return home after rehab PT Goal Formulation: With patient Time For Goal Achievement: 04/27/21 Potential to Achieve Goals: Fair Progress towards PT goals: Progressing toward goals    Frequency    Min 3X/week      PT Plan Current plan remains appropriate    Co-evaluation              AM-PAC PT "6 Clicks" Mobility   Outcome Measure  Help needed  turning from your back to your side while in a flat bed without using bedrails?: A Lot Help needed moving from lying on your back to sitting on the side of a flat bed without using bedrails?: A Lot Help needed moving to and from a bed to a chair (including a wheelchair)?: Total Help needed standing up from a chair using your arms (e.g., wheelchair or bedside chair)?: Total Help needed to walk in hospital room?: Total Help needed climbing 3-5 steps with a railing? : Total 6 Click Score: 8    End of Session   Activity Tolerance: Patient tolerated treatment well;Patient limited by fatigue Patient left: in bed;with call bell/phone within reach Nurse Communication: Mobility status PT Visit Diagnosis: Unsteadiness on feet (R26.81);Other abnormalities of gait and mobility (R26.89);Muscle weakness (generalized) (M62.81)     Time: 1030-1045 PT Time Calculation (min) (ACUTE ONLY): 15 min  Charges:  $Therapeutic Exercise: 8-22 mins                     12:00 PM, 04/17/21 Lonell Grandchild, MPT Physical Therapist with [facility] [phone] office [phone] mobile phone

## 2021-04-17 NOTE — Progress Notes (Signed)
Nsg Discharge Note  Admit Date:  04/11/2021 Discharge date: 04/17/2021   Carla Little to be D/C'd Skilled nursing facility per MD order.  AVS completed.  Copy for chart, and copy for patient signed, and dated. Patient/caregiver able to verbalize understanding.  Discharge Medication: Allergies as of 04/17/2021       Reactions   Azithromycin Shortness Of Breath, Nausea And Vomiting        Medication List     STOP taking these medications    hydrocortisone cream 1 %   pantoprazole 40 MG tablet Commonly known as: PROTONIX       TAKE these medications    acetaminophen 325 MG tablet Commonly known as: TYLENOL Take 2 tablets (650 mg total) by mouth every 6 (six) hours as needed for mild pain (or Fever >/= 101).   albuterol 108 (90 Base) MCG/ACT inhaler Commonly known as: VENTOLIN HFA Inhale 1 puff into the lungs every 6 (six) hours as needed for wheezing or shortness of breath.   buPROPion 150 MG 12 hr tablet Commonly known as: WELLBUTRIN SR Take 150 mg by mouth daily. What changed: Another medication with the same name was removed. Continue taking this medication, and follow the directions you see here.   busPIRone 5 MG tablet Commonly known as: BUSPAR Take 1 tablet (5 mg total) by mouth 2 (two) times daily.   ciprofloxacin 500 MG tablet Commonly known as: Cipro Take 1 tablet (500 mg total) by mouth 2 (two) times daily for 4 days.   cyanocobalamin 1000 MCG/ML injection Commonly known as: (VITAMIN B-12) Inject 1 mL (1,000 mcg total) into the muscle every 30 (thirty) days.   diphenhydrAMINE 25 MG tablet Commonly known as: BENADRYL Take 25 mg by mouth every 8 (eight) hours as needed for itching.   divalproex 125 MG capsule Commonly known as: DEPAKOTE SPRINKLE Take 125 mg by mouth 2 (two) times daily.   folic acid 1 MG tablet Commonly known as: FOLVITE Take 1 tablet (1 mg total) by mouth daily.   Gerhardt's butt cream Crea Apply 1 application topically as  needed for irritation.   guaiFENesin-dextromethorphan 100-10 MG/5ML syrup Commonly known as: ROBITUSSIN DM Take 10 mLs by mouth every 4 (four) hours as needed for cough.   hydrocortisone 25 MG suppository Commonly known as: ANUSOL-HC Place 1 suppository (25 mg total) rectally 2 (two) times daily.   lidocaine 5 % Commonly known as: LIDODERM Place 1 patch onto the skin daily. Remove & Discard patch within 12 hours or as directed by MD   linaclotide 290 MCG Caps capsule Commonly known as: LINZESS Take 1 capsule (290 mcg total) by mouth daily before breakfast. Start taking on: April 18, 2021   melatonin 5 MG Tabs Take 10 mg by mouth at bedtime.   methocarbamol 500 MG tablet Commonly known as: ROBAXIN Take 1 tablet (500 mg total) by mouth every 6 (six) hours as needed for muscle spasms.   metoprolol tartrate 25 MG tablet Commonly known as: LOPRESSOR Take 0.5 tablets (12.5 mg total) by mouth 2 (two) times daily. What changed: how much to take   omeprazole 20 MG capsule Commonly known as: PRILOSEC Take 20 mg by mouth in the morning and at bedtime.   ondansetron 4 MG disintegrating tablet Commonly known as: ZOFRAN-ODT Take 1 tablet (4 mg total) by mouth every 8 (eight) hours as needed for nausea or vomiting.   oxyCODONE 5 MG immediate release tablet Commonly known as: Oxy IR/ROXICODONE Take 5 mg by mouth  every 8 (eight) hours as needed for severe pain.   phenazopyridine 100 MG tablet Commonly known as: PYRIDIUM Take 1 tablet (100 mg total) by mouth 3 (three) times daily as needed for pain.   polyethylene glycol 17 g packet Commonly known as: MIRALAX / GLYCOLAX Take 17 g by mouth 2 (two) times daily. What changed: when to take this   predniSONE 10 MG tablet Commonly known as: DELTASONE Take 20mg  po daily for 1 day then 10mg  daily for 1 day then stop Start taking on: April 18, 2021   pregabalin 75 MG capsule Commonly known as: LYRICA Take 1 capsule (75 mg total)  by mouth 3 (three) times daily.   senna-docusate 8.6-50 MG tablet Commonly known as: Senokot-S Take 1 tablet by mouth at bedtime as needed for mild constipation.   Vitamin D3 25 MCG tablet Commonly known as: Vitamin D Take 2 tablets (2,000 Units total) by mouth daily.               Discharge Care Instructions  (From admission, onward)           Start     Ordered   04/17/21 0000  Discharge wound care:       Comments: Keep wounds on buttocks clean and covered with dressing. Can use Gerhardt cream as needed. Use barrier cream as needed to protect skin from bowel movements   04/17/21 1126            Discharge Assessment: Vitals:   04/17/21 0903 04/17/21 0920  BP: 104/63   Pulse: (!) 127 95  Resp:    Temp:    SpO2:  98%   Skin clean, dry and intact without evidence of skin break down, no evidence of skin tears noted. IV catheter discontinued intact. Site without signs and symptoms of complications - no redness or edema noted at insertion site, patient denies c/o pain - only slight tenderness at site.  Dressing with slight pressure applied.  D/c Instructions-Education: Discharge instructions given to patient/family with verbalized understanding. D/c education completed with patient/family including follow up instructions, medication list, d/c activities limitations if indicated, with other d/c instructions as indicated by MD - patient able to verbalize understanding, all questions fully answered. Patient instructed to return to ED, call 911, or call MD for any changes in condition.  Patient escorted via WC, and D/C home via private auto.  04/19/21, LPN 04/19/21 Demetrio Lapping PM

## 2021-04-17 NOTE — Discharge Summary (Signed)
Physician Discharge Summary  ADRENE MANN [id number] DOB: April 20, 1990 DOA: 04/11/2021  PCP: Patient, No Pcp Per (Inactive)  Admit date: 04/11/2021 Discharge date: 04/17/2021  Admitted From: SNF Disposition:  SNF  Recommendations for Outpatient Follow-up:  Follow up with PCP in 1-2 weeks Please obtain BMP/CBC in one week   Discharge Condition:stable CODE STATUS:full code Diet recommendation: heart healthy  Brief/Interim Summary: 31 year old female with recent diagnosis of Guillain-Barr syndrome, morbid obesity, currently a resident of a skilled nursing facility for rehab, admitted to the hospital with shortness of breath and hypoxia.  Found to have COVID-19 pneumonia. Treated with remdesivir and steroids.  Respiratory status has been stable.  She has had persistent constipation despite several bowel agents.  She was started on Linzess and is now having bowel movements.  Diet is being advanced and she was having vomiting.  She also complained of dysuria and UA indicated possible infection. Started on ceftriaxone. Urine culture with gram negative rods. She has been started on cipro. She is otherwise stable for discharge back to SNF  Discharge Diagnoses:  Principal Problem:   Pneumonia due to COVID-19 virus Active Problems:   Nonalcoholic steatohepatitis (NASH)   Class 3 obesity (HCC)   Hypokalemia   GERD (gastroesophageal reflux disease)   Depression   Guillain Barr syndrome (HCC)   Hypomagnesemia  Acute respiratory failure with hypoxia -Secondary to COVID-19 pneumonia -Requiring 3 L of oxygen on admission -Currently has been weaned down to room air -Of note, during her last hospitalization, she was noted to have nocturnal hypoxia and was recommended to have outpatient sleep study -may need supplemental oxygen at night, but is doing fine on room air during the day   COVID-19 pneumonia -Since she would be considered high risk due to her comorbidities, she was treated with  remdesivir -Started on steroids since she is hypoxic.  Currently on prednisone taper -Continue albuterol and Mucinex -Inflammatory markers mildly elevated on admission, but trending down -Procalcitonin negative -D-dimer negative   Nausea and vomiting -She did have similar episodes during her last hospitalization, but reports improvement -Her current symptoms recurred over the past week -Treat supportively with antiemetics -tolerating  soft foods now   Constipation -Reports that she had not had a bowel movement in over a week prior to admission -started on linzess in addition to miralax -Now having daily bowel movements     Hypokalemia/hypomagnesemia -Related to vomiting and decreased p.o. intake -Replaced   Depression/anxiety -Continue outpatient dose of Wellbutrin and BuSpar -She is also on Depakote   Morbid obesity -Increased risk of morbidity and mortality with her current illness.   Recent Guillain-Barr diagnosis with generalized weakness -currently unable to stand -Continue physical therapy   UTI -complained of dysuria -UA with possible UTI -urine culture with gram neg rods -on ceftriaxone for 3 days and transitioned to cipro on discharge -pyridium for dysuria   Pressure injury, right and left buttocks, stage 2, present on admission:                 Pressure Injury 04/12/21 Buttocks Right Stage 2 -  Partial thickness loss of dermis presenting as a shallow open injury with a red, pink wound bed without slough. (Active)  04/12/21 0940  Location: Buttocks  Location Orientation: Right  Staging: Stage 2 -  Partial thickness loss of dermis presenting as a shallow open injury with a red, pink wound bed without slough.  Wound Description (Comments):   Present on Admission: Yes     Pressure Injury  04/12/21 Buttocks Left Stage 2 -  Partial thickness loss of dermis presenting as a shallow open injury with a red, pink wound bed without slough. (Active)  04/12/21  0954  Location: Buttocks  Location Orientation: Left  Staging: Stage 2 -  Partial thickness loss of dermis presenting as a shallow open injury with a red, pink wound bed without slough.  Wound Description (Comments):   Present on Admission: Yes  Continue supportive care with daily dressing changes and barrier cream.      Discharge Instructions  Discharge Instructions     Diet - low sodium heart healthy   Complete by: As directed    Discharge wound care:   Complete by: As directed    Keep wounds on buttocks clean and covered with dressing. Can use Gerhardt cream as needed. Use barrier cream as needed to protect skin from bowel movements   Increase activity slowly   Complete by: As directed       Allergies as of 04/17/2021       Reactions   Azithromycin Shortness Of Breath, Nausea And Vomiting        Medication List     STOP taking these medications    hydrocortisone cream 1 %   pantoprazole 40 MG tablet Commonly known as: PROTONIX       TAKE these medications    acetaminophen 325 MG tablet Commonly known as: TYLENOL Take 2 tablets (650 mg total) by mouth every 6 (six) hours as needed for mild pain (or Fever >/= 101).   albuterol 108 (90 Base) MCG/ACT inhaler Commonly known as: VENTOLIN HFA Inhale 1 puff into the lungs every 6 (six) hours as needed for wheezing or shortness of breath.   buPROPion 150 MG 12 hr tablet Commonly known as: WELLBUTRIN SR Take 150 mg by mouth daily. What changed: Another medication with the same name was removed. Continue taking this medication, and follow the directions you see here.   busPIRone 5 MG tablet Commonly known as: BUSPAR Take 1 tablet (5 mg total) by mouth 2 (two) times daily.   ciprofloxacin 500 MG tablet Commonly known as: Cipro Take 1 tablet (500 mg total) by mouth 2 (two) times daily for 4 days.   cyanocobalamin 1000 MCG/ML injection Commonly known as: (VITAMIN B-12) Inject 1 mL (1,000 mcg total) into the  muscle every 30 (thirty) days.   diphenhydrAMINE 25 MG tablet Commonly known as: BENADRYL Take 25 mg by mouth every 8 (eight) hours as needed for itching.   divalproex 125 MG capsule Commonly known as: DEPAKOTE SPRINKLE Take 125 mg by mouth 2 (two) times daily.   folic acid 1 MG tablet Commonly known as: FOLVITE Take 1 tablet (1 mg total) by mouth daily.   Gerhardt's butt cream Crea Apply 1 application topically as needed for irritation.   guaiFENesin-dextromethorphan 100-10 MG/5ML syrup Commonly known as: ROBITUSSIN DM Take 10 mLs by mouth every 4 (four) hours as needed for cough.   hydrocortisone 25 MG suppository Commonly known as: ANUSOL-HC Place 1 suppository (25 mg total) rectally 2 (two) times daily.   lidocaine 5 % Commonly known as: LIDODERM Place 1 patch onto the skin daily. Remove & Discard patch within 12 hours or as directed by MD   linaclotide 290 MCG Caps capsule Commonly known as: LINZESS Take 1 capsule (290 mcg total) by mouth daily before breakfast. Start taking on: April 18, 2021   melatonin 5 MG Tabs Take 10 mg by mouth at bedtime.   methocarbamol  500 MG tablet Commonly known as: ROBAXIN Take 1 tablet (500 mg total) by mouth every 6 (six) hours as needed for muscle spasms.   metoprolol tartrate 25 MG tablet Commonly known as: LOPRESSOR Take 0.5 tablets (12.5 mg total) by mouth 2 (two) times daily. What changed: how much to take   omeprazole 20 MG capsule Commonly known as: PRILOSEC Take 20 mg by mouth in the morning and at bedtime.   ondansetron 4 MG disintegrating tablet Commonly known as: ZOFRAN-ODT Take 1 tablet (4 mg total) by mouth every 8 (eight) hours as needed for nausea or vomiting.   oxyCODONE 5 MG immediate release tablet Commonly known as: Oxy IR/ROXICODONE Take 5 mg by mouth every 8 (eight) hours as needed for severe pain.   phenazopyridine 100 MG tablet Commonly known as: PYRIDIUM Take 1 tablet (100 mg total) by mouth 3  (three) times daily as needed for pain.   polyethylene glycol 17 g packet Commonly known as: MIRALAX / GLYCOLAX Take 17 g by mouth 2 (two) times daily. What changed: when to take this   predniSONE 10 MG tablet Commonly known as: DELTASONE Take 20mg  po daily for 1 day then 10mg  daily for 1 day then stop Start taking on: April 18, 2021   pregabalin 75 MG capsule Commonly known as: LYRICA Take 1 capsule (75 mg total) by mouth 3 (three) times daily.   senna-docusate 8.6-50 MG tablet Commonly known as: Senokot-S Take 1 tablet by mouth at bedtime as needed for mild constipation.   Vitamin D3 25 MCG tablet Commonly known as: Vitamin D Take 2 tablets (2,000 Units total) by mouth daily.               Discharge Care Instructions  (From admission, onward)           Start     Ordered   04/17/21 0000  Discharge wound care:       Comments: Keep wounds on buttocks clean and covered with dressing. Can use Gerhardt cream as needed. Use barrier cream as needed to protect skin from bowel movements   04/17/21 1126            Allergies  Allergen Reactions   Azithromycin Shortness Of Breath and Nausea And Vomiting    Consultations:    Procedures/Studies: CT Angio Chest Pulmonary Embolism (PE) W or WO Contrast  Result Date: 04/12/2021 CLINICAL DATA:  Concern for pulmonary embolism. EXAM: CT ANGIOGRAPHY CHEST WITH CONTRAST TECHNIQUE: Multidetector CT imaging of the chest was performed using the standard protocol during bolus administration of intravenous contrast. Multiplanar CT image reconstructions and MIPs were obtained to evaluate the vascular anatomy. RADIATION DOSE REDUCTION: This exam was performed according to the departmental dose-optimization program which includes automated exposure control, adjustment of the mA and/or kV according to patient size and/or use of iterative reconstruction technique. CONTRAST:  167mL OMNIPAQUE IOHEXOL 350 MG/ML SOLN COMPARISON:  CT  dated 11/03/2020 and chest radiograph dated 04/12/2021. FINDINGS: Evaluation of this exam is limited due to respiratory motion artifact. Cardiovascular: Top-normal cardiac size. No pericardial effusion. The thoracic aorta is unremarkable. Evaluation of the pulmonary arteries is very limited due to severe respiratory motion artifact. No large or central pulmonary artery embolus identified. Mediastinum/Nodes: No hilar or mediastinal adenopathy. The esophagus is grossly unremarkable. No mediastinal fluid collection. Lungs/Pleura: Bilateral patchy airspace opacities most concerning for multilobar pneumonia. No pleural effusion pneumothorax. The central airways are patent. Upper Abdomen: Severe fatty liver. Musculoskeletal: No chest wall abnormality. No acute  or significant osseous findings. Review of the MIP images confirms the above findings. IMPRESSION: 1. No CT evidence of central pulmonary artery embolus. 2. Multifocal pneumonia. Clinical correlation and follow-up to resolution recommended. 3. Severe fatty liver. Electronically Signed   By: Anner Crete M.D.   On: 04/12/2021 02:21   DG Chest Port 1 View  Result Date: 04/12/2021 CLINICAL DATA:  Shortness of breath. EXAM: PORTABLE CHEST 1 VIEW COMPARISON:  Chest radiograph dated 01/02/2021. FINDINGS: Shallow inspiration. There is mild cardiomegaly with mild vascular congestion. Left upper lobe streaky densities may represent vascular congestion. Developing infiltrate is not excluded. Clinical correlation is recommended. No focal consolidation, pleural effusion, or pneumothorax. No acute osseous pathology. IMPRESSION: 1. Mild cardiomegaly with mild vascular congestion. 2. Left upper lobe streaky densities may represent vascular congestion versus developing infiltrate. Electronically Signed   By: Anner Crete M.D.   On: 04/12/2021 00:38      Subjective: Feeling nauseous this morning, but no vomiting. Had BM last night and is passing flatus. She has  cough. Continues to have some dysuria  Discharge Exam: Vitals:   04/16/21 2102 04/17/21 0556 04/17/21 0903 04/17/21 0920  BP: (!) 91/56 (!) 96/58 104/63   Pulse: 99 96 (!) 127 95  Resp: 18 18    Temp: 98.4 F (36.9 C) 98.4 F (36.9 C)    TempSrc: Oral Oral    SpO2: 95% 98%    Weight:      Height:        General: Pt is alert, awake, not in acute distress Cardiovascular: RRR, S1/S2 +, no rubs, no gallops Respiratory: CTA bilaterally, no wheezing, no rhonchi Abdominal: Soft, NT, ND, bowel sounds + Extremities: no edema, no cyanosis    The results of significant diagnostics from this hospitalization (including imaging, microbiology, ancillary and laboratory) are listed below for reference.     Microbiology: Recent Results (from the past 240 hour(s))  Resp Panel by RT-PCR (Flu A&B, Covid) Nasopharyngeal Swab     Status: Abnormal   Collection Time: 04/09/21  5:14 PM   Specimen: Nasopharyngeal Swab; Nasopharyngeal(NP) swabs in vial transport medium  Result Value Ref Range Status   SARS Coronavirus 2 by RT PCR POSITIVE (A) NEGATIVE Final    Comment: (NOTE) SARS-CoV-2 target nucleic acids are DETECTED.  The SARS-CoV-2 RNA is generally detectable in upper respiratory specimens during the acute phase of infection. Positive results are indicative of the presence of the identified virus, but do not rule out bacterial infection or co-infection with other pathogens not detected by the test. Clinical correlation with patient history and other diagnostic information is necessary to determine patient infection status. The expected result is Negative.  Fact Sheet for Patients: EntrepreneurPulse.com.au  Fact Sheet for Healthcare Providers: IncredibleEmployment.be  This test is not yet approved or cleared by the [place] FDA and  has been authorized for detection and/or diagnosis of SARS-CoV-2 by FDA under an Emergency Use Authorization (EUA).   This EUA will remain in effect (meaning this test can be used) for the duration of  the COVID-19 declaration under Section 564(b)(1) of the A ct, 21 U.S.C. section 360bbb-3(b)(1), unless the authorization is terminated or revoked sooner.     Influenza A by PCR NEGATIVE NEGATIVE Final   Influenza B by PCR NEGATIVE NEGATIVE Final    Comment: (NOTE) The Xpert Xpress SARS-CoV-2/FLU/RSV plus assay is intended as an aid in the diagnosis of influenza from Nasopharyngeal swab specimens and should not be used as a sole basis for  treatment. Nasal washings and aspirates are unacceptable for Xpert Xpress SARS-CoV-2/FLU/RSV testing.  Fact Sheet for Patients: EntrepreneurPulse.com.au  Fact Sheet for Healthcare Providers: IncredibleEmployment.be  This test is not yet approved or cleared by the [place] FDA and has been authorized for detection and/or diagnosis of SARS-CoV-2 by FDA under an Emergency Use Authorization (EUA). This EUA will remain in effect (meaning this test can be used) for the duration of the COVID-19 declaration under Section 564(b)(1) of the Act, 21 U.S.C. section 360bbb-3(b)(1), unless the authorization is terminated or revoked.  Performed at [facility], [address] [place], [place] [address]   Urine Culture     Status: Abnormal (Preliminary result)   Collection Time: 04/15/21  6:24 AM   Specimen: Urine, Clean Catch  Result Value Ref Range Status   Specimen Description   Final    URINE, CLEAN CATCH Performed at [facility], [address] [place], [place] [address]    Special Requests   Final    NONE Performed at [facility], [address], [place], [place] [address]    Culture (A)  Final    >=100,000 COLONIES/mL GRAM NEGATIVE RODS CULTURE REINCUBATED FOR BETTER GROWTH Performed at [facility], [place] [address], [place],  [address]    Report Status PENDING  Incomplete     Labs: BNP (last 3  results) No results for input(s): BNP in the last 8760 hours. Basic Metabolic Panel: Recent Labs  Lab 04/11/21 2301 04/12/21 1207 04/12/21 1446 04/13/21 0601 04/14/21 0519 04/15/21 0438 04/16/21 0558 04/17/21 0447  NA 138  --   --  139 139 138 141 138  K 2.5*  --  3.3* 3.8 4.1 4.3 3.4* 3.5  CL 102  --   --  104 104 105 110 102  CO2 26  --   --  25 25 25 24 27   GLUCOSE 77  --   --  115* 121* 125* 90 72  BUN <5*  --   --  <5* <5* 6 7 7   CREATININE <0.30*   < >  --  0.32* 0.35* 0.41* 0.34* 0.30*  CALCIUM 8.0*  --   --  8.4* 9.0 9.2 8.0* 8.9  MG 1.6*  --  1.9 1.9  --   --   --   --    < > = values in this interval not displayed.   Liver Function Tests: Recent Labs  Lab 04/13/21 0601 04/14/21 0519 04/15/21 0438 04/16/21 0558 04/17/21 0447  AST 211* 307* 267* 292* 300*  ALT 41 60* 67* 78* 98*  ALKPHOS 82 92 87 84 83  BILITOT 1.6* 1.8* 2.1* 1.7* 2.0*  PROT 6.7 7.1 7.2 5.8* 6.3*  ALBUMIN 2.8* 3.0* 3.0* 2.4* 2.8*   No results for input(s): LIPASE, AMYLASE in the last 168 hours. No results for input(s): AMMONIA in the last 168 hours. CBC: Recent Labs  Lab 04/11/21 2301 04/12/21 1207 04/13/21 0601 04/14/21 0519 04/15/21 0438 04/16/21 0558  WBC 8.1 5.1 6.1 8.2 7.5 8.9  NEUTROABS 4.7 3.7 4.3 6.3 5.6  --   HGB 11.2* 11.4* 10.6* 11.5* 11.6* 10.2*  HCT 35.0* 34.9* 34.5* 35.6* 36.2 32.4*  MCV 83.1 83.3 81.4 83.4 83.6 84.6  PLT 369 374 449* 547* 572* 500*   Cardiac Enzymes: No results for input(s): CKTOTAL, CKMB, CKMBINDEX, TROPONINI in the last 168 hours. BNP: Invalid input(s): POCBNP CBG: No results for input(s): GLUCAP in the last 168 hours. D-Dimer Recent Labs    04/15/21  [id number]  DDIMER 0.37   Hgb A1c No results for input(s): HGBA1C in the last 72 hours. Lipid Profile No results for input(s): CHOL, HDL, LDLCALC, TRIG, CHOLHDL, LDLDIRECT in the last 72 hours. Thyroid function studies No results for input(s): TSH, T4TOTAL, T3FREE, THYROIDAB in the last 72  hours.  Invalid input(s): FREET3 Anemia work up Recent Labs    04/15/21 0438  FERRITIN 34   Urinalysis    Component Value Date/Time   COLORURINE AMBER (A) 04/15/2021 0624   APPEARANCEUR HAZY (A) 04/15/2021 0624   LABSPEC 1.018 04/15/2021 0624   PHURINE 7.0 04/15/2021 0624   GLUCOSEU NEGATIVE 04/15/2021 0624   HGBUR MODERATE (A) 04/15/2021 0624   BILIRUBINUR NEGATIVE 04/15/2021 0624   KETONESUR NEGATIVE 04/15/2021 0624   PROTEINUR 30 (A) 04/15/2021 0624   UROBILINOGEN 1.0 10/27/2013 2116   NITRITE POSITIVE (A) 04/15/2021 0624   LEUKOCYTESUR MODERATE (A) 04/15/2021 0624   Sepsis Labs Invalid input(s): PROCALCITONIN,  WBC,  LACTICIDVEN Microbiology Recent Results (from the past 240 hour(s))  Resp Panel by RT-PCR (Flu A&B, Covid) Nasopharyngeal Swab     Status: Abnormal   Collection Time: 04/09/21  5:14 PM   Specimen: Nasopharyngeal Swab; Nasopharyngeal(NP) swabs in vial transport medium  Result Value Ref Range Status   SARS Coronavirus 2 by RT PCR POSITIVE (A) NEGATIVE Final    Comment: (NOTE) SARS-CoV-2 target nucleic acids are DETECTED.  The SARS-CoV-2 RNA is generally detectable in upper respiratory specimens during the acute phase of infection. Positive results are indicative of the presence of the identified virus, but do not rule out bacterial infection or co-infection with other pathogens not detected by the test. Clinical correlation with patient history and other diagnostic information is necessary to determine patient infection status. The expected result is Negative.  Fact Sheet for Patients: EntrepreneurPulse.com.au  Fact Sheet for Healthcare Providers: IncredibleEmployment.be  This test is not yet approved or cleared by the [place] FDA and  has been authorized for detection and/or diagnosis of SARS-CoV-2 by FDA under an Emergency Use Authorization (EUA).  This EUA will remain in effect (meaning this test can be  used) for the duration of  the COVID-19 declaration under Section 564(b)(1) of the A ct, 21 U.S.C. section 360bbb-3(b)(1), unless the authorization is terminated or revoked sooner.     Influenza A by PCR NEGATIVE NEGATIVE Final   Influenza B by PCR NEGATIVE NEGATIVE Final    Comment: (NOTE) The Xpert Xpress SARS-CoV-2/FLU/RSV plus assay is intended as an aid in the diagnosis of influenza from Nasopharyngeal swab specimens and should not be used as a sole basis for treatment. Nasal washings and aspirates are unacceptable for Xpert Xpress SARS-CoV-2/FLU/RSV testing.  Fact Sheet for Patients: EntrepreneurPulse.com.au  Fact Sheet for Healthcare Providers: IncredibleEmployment.be  This test is not yet approved or cleared by the [place] FDA and has been authorized for detection and/or diagnosis of SARS-CoV-2 by FDA under an Emergency Use Authorization (EUA). This EUA will remain in effect (meaning this test can be used) for the duration of the COVID-19 declaration under Section 564(b)(1) of the Act, 21 U.S.C. section 360bbb-3(b)(1), unless the authorization is terminated or revoked.  Performed at [facility], [address] [place], [place] [address]   Urine Culture     Status: Abnormal (Preliminary result)   Collection Time: 04/15/21  6:24 AM   Specimen: Urine, Clean Catch  Result Value Ref Range Status   Specimen Description   Final    URINE, CLEAN CATCH Performed at [facility], [address], [place], [place] [address]    Special Requests   Final    NONE Performed at [facility], [address], [place], [place] [address]    Culture (A)  Final    >=100,000 COLONIES/mL GRAM NEGATIVE RODS CULTURE REINCUBATED FOR BETTER GROWTH Performed at [facility], [place] [address], [place], [place] [address]    Report Status PENDING  Incomplete     Time coordinating discharge: 33mins  SIGNED:   Kathie Dike, MD  [facility] 04/17/2021, 11:31 AM   If 7PM-7AM, please contact night-coverage [email or web address]

## 2021-04-17 NOTE — Progress Notes (Signed)
Attempted to call report to [place] with no answer.

## 2021-04-17 NOTE — Progress Notes (Signed)
Report given to nurse at [facility]

## 2021-04-17 NOTE — [place] FL2 (Addendum)
[place] MEDICAID FL2 LEVEL OF CARE SCREENING TOOL     IDENTIFICATION  Patient Name: Carla Little Birthdate: Feb 20, 1991 Sex: female Admission Date (Current Location): 04/11/2021  [facility] and [place] Number:  [employer] and Address:  [facility],  [address] [place] [address]      Provider Number: [id number]  Attending Physician Name and Address:  Erick Blinks, MD  Relative Name and Phone Number:  Rae Lips - [phone]    Current Level of Care: Hospital Recommended Level of Care: Skilled Nursing Facility Prior Approval Number:    Date Approved/Denied:   PASRR Number: [id number]  Discharge Plan: SNF    Current Diagnoses: Patient Active Problem List   Diagnosis Date Noted   Acute respiratory failure with hypoxia (HCC)    Pneumonia due to COVID-19 virus 04/12/2021   Hypomagnesemia 04/12/2021   Guillain Barr syndrome (HCC) 12/28/2020   E. coli UTI    Intractable vomiting with nausea 12/09/2020   PUD (peptic ulcer disease) 12/09/2020   Nonalcoholic steatohepatitis (NASH) 12/09/2020   Class 3 obesity (HCC) 12/09/2020   Sinus tachycardia 12/09/2020   Hypokalemia 12/09/2020   GERD (gastroesophageal reflux disease)    Depression    Iron deficiency anemia due to chronic blood loss    Pressure injury of skin 11/21/2020   Left knee dislocation 11/01/2020   Knee dislocation, left, initial encounter 11/01/2020    Orientation RESPIRATION BLADDER Height & Weight     Self, Time, Situation, Place  Oxygen PRN/ Night External catheter Weight: (!) 185.2 kg Height:  5\' 5"  (165.1 cm)  BEHAVIORAL SYMPTOMS/MOOD NEUROLOGICAL BOWEL NUTRITION STATUS      Incontinent Diet (See DC summary)  AMBULATORY STATUS COMMUNICATION OF NEEDS Skin   Extensive Assist Verbally Other (Comment) (stretch marks)                       Personal Care Assistance Level of Assistance  Bathing, Feeding, Dressing Bathing Assistance: Maximum  assistance Feeding assistance: Limited assistance Dressing Assistance: Maximum assistance     Functional Limitations Info  Sight, Hearing, Speech Sight Info: Adequate Hearing Info: Adequate Speech Info: Adequate    SPECIAL CARE FACTORS FREQUENCY  PT (By licensed PT)     PT Frequency: 5 times a week              Contractures Contractures Info: Not present    Additional Factors Info  Code Status, Allergies Code Status Info: FULL Allergies Info: Azithomycin           Current Medications (04/17/2021):  This is the current hospital active medication list Current Facility-Administered Medications  Medication Dose Route Frequency Provider Last Rate Last Admin   0.9 %  sodium chloride infusion   Intravenous PRN 04/19/2021, MD 10 mL/hr at 04/15/21 1014 New Bag at 04/15/21 1014   0.9 %  sodium chloride infusion   Intravenous Continuous 04/17/21, MD 75 mL/hr at 04/17/21 0436 New Bag at 04/17/21 0436   acetaminophen (TYLENOL) tablet 650 mg  650 mg Oral Q6H PRN 04/19/21, MD       albuterol (VENTOLIN HFA) 108 (90 Base) MCG/ACT inhaler 1 puff  1 puff Inhalation Q6H PRN Erick Blinks, MD       buPROPion (WELLBUTRIN SR) 12 hr tablet 150 mg  150 mg Oral Daily Erick Blinks, MD   150 mg at 04/17/21 0903   busPIRone (BUSPAR) tablet 5 mg  5 mg Oral BID 04/19/21, MD  5 mg at 04/17/21 0906   cefTRIAXone (ROCEPHIN) 1 g in sodium chloride 0.9 % 100 mL IVPB  1 g Intravenous Q24H Erick Blinks, MD 200 mL/hr at 04/17/21 1154 1 g at 04/17/21 1154   chlorpheniramine-HYDROcodone 10-8 MG/5ML suspension 5 mL  5 mL Oral Q12H PRN Erick Blinks, MD       divalproex (DEPAKOTE SPRINKLE) capsule 125 mg  125 mg Oral BID Erick Blinks, MD   125 mg at 04/17/21 0906   enoxaparin (LOVENOX) injection 90 mg  90 mg Subcutaneous Q24H Erick Blinks, MD   90 mg at 04/17/21 0459   folic acid (FOLVITE) tablet 1 mg  1 mg Oral Daily Erick Blinks, MD   1 mg at 04/17/21 9774    Gerhardt's butt cream   Topical PRN Erick Blinks, MD       guaiFENesin-dextromethorphan (ROBITUSSIN DM) 100-10 MG/5ML syrup 10 mL  10 mL Oral Q4H PRN Erick Blinks, MD   10 mL at 04/16/21 2227   hydrocortisone (ANUSOL-HC) suppository 25 mg  25 mg Rectal BID Erick Blinks, MD   25 mg at 04/16/21 2221   lidocaine (LIDODERM) 5 % 1 patch  1 patch Transdermal Daily Erick Blinks, MD   1 patch at 04/17/21 0915   lidocaine (XYLOCAINE) 5 % ointment   Topical Daily PRN Erick Blinks, MD       linaclotide (LINZESS) capsule 290 mcg  290 mcg Oral QAC breakfast Erick Blinks, MD   290 mcg at 04/17/21 0903   melatonin tablet 9 mg  9 mg Oral QHS Erick Blinks, MD   9 mg at 04/16/21 2223   methocarbamol (ROBAXIN) tablet 500 mg  500 mg Oral Q6H PRN Erick Blinks, MD       metoprolol tartrate (LOPRESSOR) tablet 12.5 mg  12.5 mg Oral BID Erick Blinks, MD   12.5 mg at 04/17/21 0903   ondansetron (ZOFRAN) tablet 4 mg  4 mg Oral Q6H PRN Erick Blinks, MD       Or   ondansetron (ZOFRAN) injection 4 mg  4 mg Intravenous Q6H PRN Erick Blinks, MD   4 mg at 04/17/21 1152   oxyCODONE (Oxy IR/ROXICODONE) immediate release tablet 5 mg  5 mg Oral Q8H PRN Erick Blinks, MD   5 mg at 04/16/21 2321   pantoprazole (PROTONIX) EC tablet 40 mg  40 mg Oral BID AC Erick Blinks, MD   40 mg at 04/17/21 0903   polyethylene glycol (MIRALAX / GLYCOLAX) packet 17 g  17 g Oral BID Erick Blinks, MD   17 g at 04/17/21 0906   potassium chloride (KLOR-CON) packet 40 mEq  40 mEq Oral BID Erick Blinks, MD   40 mEq at 04/17/21 0911   predniSONE (DELTASONE) tablet 30 mg  30 mg Oral Daily Erick Blinks, MD   30 mg at 04/17/21 1423   pregabalin (LYRICA) capsule 75 mg  75 mg Oral TID Erick Blinks, MD   75 mg at 04/17/21 9532   senna-docusate (Senokot-S) tablet 1 tablet  1 tablet Oral BID Erick Blinks, MD   1 tablet at 04/13/21 2141     Discharge Medications: Please see discharge summary for a list of  discharge medications.  Relevant Imaging Results:  Relevant Lab Results:   Additional Information [id number]  Leitha Bleak, RN

## 2021-04-17 NOTE — TOC Transition Note (Signed)
Transition of Care [facility]) - CM/SW Discharge Note   Patient Details  Name: Carla Little MRN: [id number] Date of Birth: 07-11-1990  [facility]) CM/SW Contact:  Boneta Lucks, RN Phone Number: 04/17/2021, 1:12 PM   Clinical Narrative:   Patient is discharging back to [facility]. LOG ran out, Jackelyn Poling is taking her back under medicaid pending.  New FL2 and discharge summary sent. EMS scheduled and medical necessity printed.    Final next level of care: Skilled Nursing Facility Barriers to Discharge: Barriers Resolved   Patient Goals and CMS Choice Patient states their goals for this hospitalization and ongoing recovery are:: needs SNF CMS Medicare.gov Compare Post Acute Care list provided to:: Patient    Discharge Placement              Patient chooses bed at:  [facility]) Patient to be transferred to facility by: EMS   Patient and family notified of of transfer: 04/17/21  Discharge Plan and Services      Readmission Risk Interventions Readmission Risk Prevention Plan 04/17/2021  Transportation Screening Complete  PCP or Specialist Appt within 3-5 Days Complete  HRI or Home Care Consult Complete  Social Work Consult for [place] Planning/Counseling Complete  Palliative Care Screening Not Applicable  Medication Review [employer]) Complete  Some recent data might be hidden

## 2021-04-18 LAB — URINE CULTURE: Culture: >=100000 — AB

## 2021-04-21 ENCOUNTER — Inpatient Hospital Stay (HOSPITAL_COMMUNITY)
Admission: EM | Admit: 2021-04-21 | Discharge: 2021-04-28 | DRG: 094 | Disposition: A | Discharge status: Skilled Nursing Facility | Payer: Medicaid Other | Attending: Internal Medicine | Admitting: Internal Medicine

## 2021-04-21 ENCOUNTER — Emergency Department (HOSPITAL_COMMUNITY): Payer: Medicaid Other

## 2021-04-21 ENCOUNTER — Encounter (HOSPITAL_COMMUNITY): Payer: Self-pay

## 2021-04-21 ENCOUNTER — Other Ambulatory Visit: Payer: Self-pay

## 2021-04-21 DIAGNOSIS — M25562 Pain in left knee: Secondary | ICD-10-CM | POA: Diagnosis present

## 2021-04-21 DIAGNOSIS — K219 Gastro-esophageal reflux disease without esophagitis: Secondary | ICD-10-CM | POA: Diagnosis present

## 2021-04-21 DIAGNOSIS — F419 Anxiety disorder, unspecified: Secondary | ICD-10-CM | POA: Diagnosis present

## 2021-04-21 DIAGNOSIS — D649 Anemia, unspecified: Secondary | ICD-10-CM | POA: Diagnosis not present

## 2021-04-21 DIAGNOSIS — L89322 Pressure ulcer of left buttock, stage 2: Secondary | ICD-10-CM | POA: Diagnosis present

## 2021-04-21 DIAGNOSIS — Z20822 Contact with and (suspected) exposure to covid-19: Secondary | ICD-10-CM | POA: Diagnosis present

## 2021-04-21 DIAGNOSIS — L89312 Pressure ulcer of right buttock, stage 2: Secondary | ICD-10-CM | POA: Diagnosis present

## 2021-04-21 DIAGNOSIS — Z8711 Personal history of peptic ulcer disease: Secondary | ICD-10-CM | POA: Diagnosis not present

## 2021-04-21 DIAGNOSIS — E876 Hypokalemia: Secondary | ICD-10-CM | POA: Diagnosis not present

## 2021-04-21 DIAGNOSIS — K5900 Constipation, unspecified: Secondary | ICD-10-CM | POA: Diagnosis present

## 2021-04-21 DIAGNOSIS — U071 COVID-19: Secondary | ICD-10-CM | POA: Diagnosis present

## 2021-04-21 DIAGNOSIS — Z6841 Body Mass Index (BMI) 40.0 and over, adult: Secondary | ICD-10-CM | POA: Diagnosis not present

## 2021-04-21 DIAGNOSIS — J1282 Pneumonia due to coronavirus disease 2019: Secondary | ICD-10-CM | POA: Diagnosis present

## 2021-04-21 DIAGNOSIS — K7581 Nonalcoholic steatohepatitis (NASH): Secondary | ICD-10-CM | POA: Diagnosis present

## 2021-04-21 DIAGNOSIS — Z881 Allergy status to other antibiotic agents status: Secondary | ICD-10-CM | POA: Diagnosis not present

## 2021-04-21 DIAGNOSIS — G8929 Other chronic pain: Secondary | ICD-10-CM | POA: Diagnosis present

## 2021-04-21 DIAGNOSIS — F32A Depression, unspecified: Secondary | ICD-10-CM | POA: Diagnosis present

## 2021-04-21 DIAGNOSIS — Z79899 Other long term (current) drug therapy: Secondary | ICD-10-CM | POA: Diagnosis not present

## 2021-04-21 DIAGNOSIS — N3000 Acute cystitis without hematuria: Secondary | ICD-10-CM | POA: Diagnosis present

## 2021-04-21 DIAGNOSIS — T783XXA Angioneurotic edema, initial encounter: Secondary | ICD-10-CM

## 2021-04-21 DIAGNOSIS — G610 Guillain-Barre syndrome: Secondary | ICD-10-CM | POA: Diagnosis present

## 2021-04-21 DIAGNOSIS — R7989 Other specified abnormal findings of blood chemistry: Secondary | ICD-10-CM | POA: Diagnosis not present

## 2021-04-21 DIAGNOSIS — R7401 Elevation of levels of liver transaminase levels: Secondary | ICD-10-CM | POA: Diagnosis not present

## 2021-04-21 DIAGNOSIS — E538 Deficiency of other specified B group vitamins: Secondary | ICD-10-CM | POA: Diagnosis present

## 2021-04-21 LAB — CBC WITH DIFFERENTIAL/PLATELET
Abs Immature Granulocytes: 0.16 10*3/uL — ABNORMAL HIGH (ref 0.00–0.07)
Basophils Absolute: 0 10*3/uL (ref 0.0–0.1)
Basophils Relative: 0 %
Eosinophils Absolute: 0 10*3/uL (ref 0.0–0.5)
Eosinophils Relative: 0 %
HCT: 35 % — ABNORMAL LOW (ref 36.0–46.0)
Hemoglobin: 10.9 g/dL — ABNORMAL LOW (ref 12.0–15.0)
Immature Granulocytes: 1 %
Lymphocytes Relative: 19 %
Lymphs Abs: 2.9 10*3/uL (ref 0.7–4.0)
MCH: 26.5 pg (ref 26.0–34.0)
MCHC: 31.1 g/dL (ref 30.0–36.0)
MCV: 85 fL (ref 80.0–100.0)
Monocytes Absolute: 1 10*3/uL (ref 0.1–1.0)
Monocytes Relative: 7 %
Neutro Abs: 10.8 10*3/uL — ABNORMAL HIGH (ref 1.7–7.7)
Neutrophils Relative %: 73 %
Platelets: 398 10*3/uL (ref 150–400)
RBC: 4.12 MIL/uL (ref 3.87–5.11)
RDW: 22.9 % — ABNORMAL HIGH (ref 11.5–15.5)
WBC: 14.8 10*3/uL — ABNORMAL HIGH (ref 4.0–10.5)
nRBC: 0 % (ref 0.0–0.2)

## 2021-04-21 LAB — COMPREHENSIVE METABOLIC PANEL
ALT: 127 U/L — ABNORMAL HIGH (ref 0–44)
AST: 259 U/L — ABNORMAL HIGH (ref 15–41)
Albumin: 3 g/dL — ABNORMAL LOW (ref 3.5–5.0)
Alkaline Phosphatase: 89 U/L (ref 38–126)
Anion gap: 13 (ref 5–15)
BUN: 6 mg/dL (ref 6–20)
CO2: 28 mmol/L (ref 22–32)
Calcium: 9 mg/dL (ref 8.9–10.3)
Chloride: 96 mmol/L — ABNORMAL LOW (ref 98–111)
Creatinine, Ser: 0.31 mg/dL — ABNORMAL LOW (ref 0.44–1.00)
GFR, Estimated: >60 mL/min (ref >60)
Glucose, Bld: 78 mg/dL (ref 70–99)
Potassium: 3.5 mmol/L (ref 3.5–5.1)
Sodium: 137 mmol/L (ref 135–145)
Total Bilirubin: 5.3 mg/dL — ABNORMAL HIGH (ref 0.3–1.2)
Total Protein: 7 g/dL (ref 6.5–8.1)

## 2021-04-21 LAB — RESP PANEL BY RT-PCR (FLU A&B, COVID) ARPGX2
Influenza A by PCR: NEGATIVE
Influenza B by PCR: NEGATIVE
SARS Coronavirus 2 by RT PCR: POSITIVE — AB

## 2021-04-21 MED ORDER — VITAMIN D 25 MCG (1000 UNIT) PO TABS
1000.0000 [IU] | ORAL_TABLET | Freq: Every day | ORAL | Status: DC
  Administered 2021-04-22 – 2021-04-28 (×7): 1000 [IU] via ORAL
  Filled 2021-04-21 (×10): qty 1

## 2021-04-21 MED ORDER — BUSPIRONE HCL 5 MG PO TABS
5.0000 mg | ORAL_TABLET | Freq: Two times a day (BID) | ORAL | Status: DC
  Administered 2021-04-21 – 2021-04-28 (×14): 5 mg via ORAL
  Filled 2021-04-21 (×14): qty 1

## 2021-04-21 MED ORDER — GADOBUTROL 1 MMOL/ML IV SOLN
10.0000 mL | Freq: Once | INTRAVENOUS | Status: AC | PRN
  Administered 2021-04-21: 10 mL via INTRAVENOUS

## 2021-04-21 MED ORDER — SENNOSIDES-DOCUSATE SODIUM 8.6-50 MG PO TABS: 1.0000 | ORAL_TABLET | Freq: Every evening | ORAL | Status: DC | PRN

## 2021-04-21 MED ORDER — POLYETHYLENE GLYCOL 3350 17 G PO PACK
17.0000 g | PACK | Freq: Two times a day (BID) | ORAL | Status: DC
  Administered 2021-04-26: 17 g via ORAL
  Filled 2021-04-21 (×10): qty 1

## 2021-04-21 MED ORDER — PREGABALIN 75 MG PO CAPS
75.0000 mg | ORAL_CAPSULE | Freq: Three times a day (TID) | ORAL | Status: DC
  Administered 2021-04-21 – 2021-04-28 (×21): 75 mg via ORAL
  Filled 2021-04-21 (×21): qty 1

## 2021-04-21 MED ORDER — ACETAMINOPHEN 325 MG PO TABS
650.0000 mg | ORAL_TABLET | Freq: Four times a day (QID) | ORAL | Status: DC | PRN
  Administered 2021-04-22 – 2021-04-28 (×2): 650 mg via ORAL
  Filled 2021-04-21 (×3): qty 2

## 2021-04-21 MED ORDER — DIPHENHYDRAMINE HCL 50 MG/ML IJ SOLN
25.0000 mg | Freq: Once | INTRAMUSCULAR | Status: AC
  Administered 2021-04-21: 25 mg via INTRAVENOUS
  Filled 2021-04-21: qty 1

## 2021-04-21 MED ORDER — DIVALPROEX SODIUM 125 MG PO CSDR
125.0000 mg | DELAYED_RELEASE_CAPSULE | Freq: Two times a day (BID) | ORAL | Status: DC
  Administered 2021-04-21 – 2021-04-22 (×2): 125 mg via ORAL
  Filled 2021-04-21 (×2): qty 1

## 2021-04-21 MED ORDER — IOHEXOL 300 MG/ML  SOLN
75.0000 mL | Freq: Once | INTRAMUSCULAR | Status: AC | PRN
  Administered 2021-04-21: 75 mL via INTRAVENOUS

## 2021-04-21 MED ORDER — MELATONIN 5 MG PO TABS
10.0000 mg | ORAL_TABLET | Freq: Every day | ORAL | Status: DC
  Filled 2021-04-21 (×3): qty 2

## 2021-04-21 MED ORDER — ZINC SULFATE 220 (50 ZN) MG PO CAPS
220.0000 mg | ORAL_CAPSULE | Freq: Every day | ORAL | Status: DC
  Administered 2021-04-22 – 2021-04-28 (×7): 220 mg via ORAL
  Filled 2021-04-21 (×7): qty 1

## 2021-04-21 MED ORDER — MELATONIN 3 MG PO TABS
9.0000 mg | ORAL_TABLET | Freq: Every evening | ORAL | Status: DC | PRN
  Administered 2021-04-21 – 2021-04-27 (×6): 9 mg via ORAL
  Filled 2021-04-21 (×6): qty 3

## 2021-04-21 MED ORDER — ONDANSETRON HCL 4 MG/2ML IJ SOLN: 4.0000 mg | Freq: Four times a day (QID) | INTRAMUSCULAR | Status: DC | PRN

## 2021-04-21 MED ORDER — PANTOPRAZOLE SODIUM 40 MG PO TBEC
40.0000 mg | DELAYED_RELEASE_TABLET | Freq: Two times a day (BID) | ORAL | Status: DC
  Administered 2021-04-21 – 2021-04-28 (×14): 40 mg via ORAL
  Filled 2021-04-21 (×14): qty 1

## 2021-04-21 MED ORDER — ONDANSETRON HCL 4 MG PO TABS: 4.0000 mg | ORAL_TABLET | Freq: Four times a day (QID) | ORAL | Status: DC | PRN

## 2021-04-21 MED ORDER — METHYLPREDNISOLONE SODIUM SUCC 125 MG IJ SOLR
125.0000 mg | Freq: Once | INTRAMUSCULAR | Status: AC
  Administered 2021-04-21: 125 mg via INTRAVENOUS
  Filled 2021-04-21: qty 2

## 2021-04-21 MED ORDER — METHOCARBAMOL 500 MG PO TABS
500.0000 mg | ORAL_TABLET | Freq: Four times a day (QID) | ORAL | Status: DC | PRN
  Administered 2021-04-28: 500 mg via ORAL
  Filled 2021-04-21: qty 1

## 2021-04-21 MED ORDER — OXYCODONE HCL 5 MG PO TABS
5.0000 mg | ORAL_TABLET | Freq: Once | ORAL | Status: AC
  Administered 2021-04-21: 5 mg via ORAL
  Filled 2021-04-21: qty 1

## 2021-04-21 MED ORDER — LINACLOTIDE 145 MCG PO CAPS
290.0000 ug | ORAL_CAPSULE | Freq: Every day | ORAL | Status: DC
  Administered 2021-04-22 – 2021-04-28 (×7): 290 ug via ORAL
  Filled 2021-04-21 (×7): qty 2

## 2021-04-21 MED ORDER — ACETAMINOPHEN 650 MG RE SUPP: 650.0000 mg | Freq: Four times a day (QID) | RECTAL | Status: DC | PRN

## 2021-04-21 MED ORDER — LORAZEPAM 1 MG PO TABS
2.0000 mg | ORAL_TABLET | Freq: Once | ORAL | Status: AC
  Administered 2021-04-21: 2 mg via ORAL
  Filled 2021-04-21 (×2): qty 2

## 2021-04-21 MED ORDER — ASCORBIC ACID 500 MG PO TABS
500.0000 mg | ORAL_TABLET | Freq: Every day | ORAL | Status: DC
  Administered 2021-04-22 – 2021-04-28 (×7): 500 mg via ORAL
  Filled 2021-04-21 (×7): qty 1

## 2021-04-21 MED ORDER — FAMOTIDINE IN NACL 20-0.9 MG/50ML-% IV SOLN
20.0000 mg | Freq: Once | INTRAVENOUS | Status: AC
  Administered 2021-04-21: 20 mg via INTRAVENOUS
  Filled 2021-04-21: qty 50

## 2021-04-21 MED ORDER — SODIUM CHLORIDE 0.9 % IV SOLN
1000.0000 mg | Freq: Every day | INTRAVENOUS | Status: AC
  Administered 2021-04-21 – 2021-04-23 (×3): 1000 mg via INTRAVENOUS
  Filled 2021-04-21 (×3): qty 16

## 2021-04-21 MED ORDER — ZINC 220 (50 ZN) MG PO CAPS: 1.0000 | ORAL_CAPSULE | Freq: Every day | ORAL | Status: DC

## 2021-04-21 MED ORDER — FOLIC ACID 1 MG PO TABS
1.0000 mg | ORAL_TABLET | Freq: Every day | ORAL | Status: DC
  Administered 2021-04-22 – 2021-04-28 (×7): 1 mg via ORAL
  Filled 2021-04-21 (×7): qty 1

## 2021-04-21 MED ORDER — ENOXAPARIN SODIUM 100 MG/ML IJ SOSY
85.0000 mg | PREFILLED_SYRINGE | INTRAMUSCULAR | Status: DC
  Administered 2021-04-21 – 2021-04-27 (×7): 85 mg via SUBCUTANEOUS
  Filled 2021-04-21 (×8): qty 1

## 2021-04-21 MED ORDER — BUPROPION HCL ER (SR) 150 MG PO TB12
150.0000 mg | ORAL_TABLET | Freq: Every day | ORAL | Status: DC
  Administered 2021-04-21 – 2021-04-28 (×8): 150 mg via ORAL
  Filled 2021-04-21 (×8): qty 1

## 2021-04-21 NOTE — ED Provider Notes (Signed)
[facility] EMERGENCY DEPARTMENT Provider Note   CSN: [id number] Arrival date & time: 04/21/21  0931     History  Chief Complaint  Patient presents with   Oral Swelling    Carla Little is a 31 y.o. female.  Patient has a history of Guillain-Barr and COVID pneumonia.  She just got out of the hospital 3 days ago.  She started with swelling to her face on Wednesday night and swelling to her neck and has continued for the last couple days.  No other symptoms  The history is provided by the patient and medical records. No language interpreter was used.  Allergic Reaction Presenting symptoms: no difficulty breathing and no rash   Severity:  Moderate Duration:  1 day Prior allergic episodes:  No prior episodes Context: not animal exposure   Relieved by:  Nothing Worsened by:  Nothing Ineffective treatments:  None tried     Home Medications Prior to Admission medications   Medication Sig Start Date End Date Taking? Authorizing Provider  acetaminophen (TYLENOL) 325 MG tablet Take 2 tablets (650 mg total) by mouth every 6 (six) hours as needed for mild pain (or Fever >/= 101). 01/26/21   Jennye Boroughs, MD  albuterol (VENTOLIN HFA) 108 (90 Base) MCG/ACT inhaler Inhale 1 puff into the lungs every 6 (six) hours as needed for wheezing or shortness of breath. 04/17/21   Kathie Dike, MD  buPROPion (WELLBUTRIN SR) 150 MG 12 hr tablet Take 150 mg by mouth daily.    [provider]  busPIRone (BUSPAR) 5 MG tablet Take 1 tablet (5 mg total) by mouth 2 (two) times daily. 01/26/21   Jennye Boroughs, MD  cholecalciferol (VITAMIN D) 25 MCG tablet Take 2 tablets (2,000 Units total) by mouth daily. 11/28/20   Shawna Clamp, MD  ciprofloxacin (CIPRO) 500 MG tablet Take 1 tablet (500 mg total) by mouth 2 (two) times daily for 4 days. 04/17/21 04/21/21  Kathie Dike, MD  cyanocobalamin (,VITAMIN B-12,) 1000 MCG/ML injection Inject 1 mL (1,000 mcg total) into the muscle every 30 (thirty)  days. 01/29/21   Jennye Boroughs, MD  diphenhydrAMINE (BENADRYL) 25 MG tablet Take 25 mg by mouth every 8 (eight) hours as needed for itching.    [provider]  divalproex (DEPAKOTE SPRINKLE) 125 MG capsule Take 125 mg by mouth 2 (two) times daily.    [provider]  folic acid (FOLVITE) 1 MG tablet Take 1 tablet (1 mg total) by mouth daily. 01/27/21   Jennye Boroughs, MD  guaiFENesin-dextromethorphan (ROBITUSSIN DM) 100-10 MG/5ML syrup Take 10 mLs by mouth every 4 (four) hours as needed for cough. 04/17/21   Kathie Dike, MD  hydrocortisone (ANUSOL-HC) 25 MG suppository Place 1 suppository (25 mg total) rectally 2 (two) times daily. 04/17/21   Kathie Dike, MD  lidocaine (LIDODERM) 5 % Place 1 patch onto the skin daily. Remove & Discard patch within 12 hours or as directed by MD 01/27/21   Jennye Boroughs, MD  linaclotide [facility]) 290 MCG CAPS capsule Take 1 capsule (290 mcg total) by mouth daily before breakfast. 04/18/21   Kathie Dike, MD  melatonin 5 MG TABS Take 10 mg by mouth at bedtime.    [provider]  methocarbamol (ROBAXIN) 500 MG tablet Take 1 tablet (500 mg total) by mouth every 6 (six) hours as needed for muscle spasms. 11/28/20   Shawna Clamp, MD  metoprolol tartrate (LOPRESSOR) 25 MG tablet Take 0.5 tablets (12.5 mg total) by mouth 2 (two) times  daily. 04/17/21   Erick Blinks, MD  Nystatin (GERHARDT'S BUTT CREAM) CREA Apply 1 application topically as needed for irritation. 04/17/21   Erick Blinks, MD  omeprazole (PRILOSEC) 20 MG capsule Take 20 mg by mouth in the morning and at bedtime.    [provider]  ondansetron (ZOFRAN-ODT) 4 MG disintegrating tablet Take 1 tablet (4 mg total) by mouth every 8 (eight) hours as needed for nausea or vomiting. 04/09/21   Marita Kansas, PA-C  oxyCODONE (OXY IR/ROXICODONE) 5 MG immediate release tablet Take 5 mg by mouth every 8 (eight) hours as needed for severe pain.    [provider]   phenazopyridine (PYRIDIUM) 100 MG tablet Take 1 tablet (100 mg total) by mouth 3 (three) times daily as needed for pain. 04/17/21 04/17/22  Erick Blinks, MD  polyethylene glycol (MIRALAX / GLYCOLAX) 17 g packet Take 17 g by mouth 2 (two) times daily. 04/17/21   Erick Blinks, MD  predniSONE (DELTASONE) 10 MG tablet Take 20mg  po daily for 1 day then 10mg  daily for 1 day then stop 04/18/21   Erick Blinks, MD  pregabalin (LYRICA) 75 MG capsule Take 1 capsule (75 mg total) by mouth 3 (three) times daily. 01/26/21   Lurene Shadow, MD  senna-docusate (SENOKOT-S) 8.6-50 MG tablet Take 1 tablet by mouth at bedtime as needed for mild constipation. 01/26/21   Lurene Shadow, MD      Allergies    Azithromycin    Review of Systems   Review of Systems  Constitutional:  Negative for appetite change and fatigue.  HENT:  Negative for congestion, ear discharge and sinus pressure.        Swelling to face  Eyes:  Negative for discharge.  Respiratory:  Negative for cough.   Cardiovascular:  Negative for chest pain.  Gastrointestinal:  Negative for abdominal pain and diarrhea.  Genitourinary:  Negative for frequency and hematuria.  Musculoskeletal:  Negative for back pain.  Skin:  Negative for rash.  Neurological:  Negative for seizures and headaches.  Psychiatric/Behavioral:  Negative for hallucinations.    Physical Exam Updated Vital Signs BP 95/66    Pulse (!) 103    Temp 98.1 F (36.7 C)    Resp 18    Ht 5\' 4"  (1.626 m)    Wt (!) 170.4 kg    LMP 04/09/2021 (Exact Date)    SpO2 100%    BMI 64.49 kg/m  Physical Exam Vitals and nursing note reviewed.  Constitutional:      Appearance: She is well-developed.  HENT:     Head: Normocephalic.     Comments: Swelling to face and neck mild to moderate oropharynx appears normal with large tonsil    Nose: Nose normal.  Eyes:     General: No scleral icterus.    Conjunctiva/sclera: Conjunctivae normal.  Neck:     Thyroid: No thyromegaly.   Cardiovascular:     Rate and Rhythm: Normal rate and regular rhythm.     Heart sounds: No murmur heard.   No friction rub. No gallop.  Pulmonary:     Breath sounds: No stridor. No wheezing or rales.  Chest:     Chest wall: No tenderness.  Abdominal:     General: There is no distension.     Tenderness: There is no abdominal tenderness. There is no rebound.  Musculoskeletal:        General: Normal range of motion.     Cervical back: Neck supple.  Lymphadenopathy:  Cervical: No cervical adenopathy.  Skin:    Findings: No erythema or rash.  Neurological:     Mental Status: She is oriented to person, place, and time.     Motor: No abnormal muscle tone.     Coordination: Coordination normal.  Psychiatric:        Behavior: Behavior normal.    ED Results / Procedures / Treatments   Labs (all labs ordered are listed, but only abnormal results are displayed) Labs Reviewed  CBC WITH DIFFERENTIAL/PLATELET - Abnormal; Notable for the following components:      Result Value   WBC 14.8 (*)    Hemoglobin 10.9 (*)    HCT 35.0 (*)    RDW 22.9 (*)    Neutro Abs 10.8 (*)    Abs Immature Granulocytes 0.16 (*)    All other components within normal limits  COMPREHENSIVE METABOLIC PANEL - Abnormal; Notable for the following components:   Chloride 96 (*)    Creatinine, Ser 0.31 (*)    Albumin 3.0 (*)    AST 259 (*)    ALT 127 (*)    Total Bilirubin 5.3 (*)    All other components within normal limits    EKG None  Radiology CT Soft Tissue Neck W Contrast  Result Date: 04/21/2021 CLINICAL DATA:  Possible throat swelling starting 3 days ago, difficulty swelling EXAM: CT NECK WITH CONTRAST TECHNIQUE: Multidetector CT imaging of the neck was performed using the standard protocol following the bolus administration of intravenous contrast. RADIATION DOSE REDUCTION: This exam was performed according to the departmental dose-optimization program which includes automated exposure control,  adjustment of the mA and/or kV according to patient size and/or use of iterative reconstruction technique. CONTRAST:  40mL OMNIPAQUE IOHEXOL 300 MG/ML  SOLN COMPARISON:  None. FINDINGS: Pharynx and larynx: The nasal cavity and nasopharynx unremarkable. The bilateral palatine tonsils are mildly prominent and partially efface the oropharyngeal airway the without abnormal enhancement or fluid collection. The oral cavity and oropharynx are otherwise unremarkable. The parapharyngeal spaces are clear. The tongue is normal in appearance. The hypopharynx and larynx are unremarkable. The vocal folds are normal in appearance. Salivary glands: The parotid glands are unremarkable. There is fatty atrophy of the submandibular glands. Thyroid: Unremarkable. Lymph nodes: There is a 9 mm left level II lymph node and 7 mm right level II node, nonspecific and not pathologically enlarged by size criteria. There is no pathologic lymphadenopathy in the neck. Vascular: Unremarkable. Limited intracranial: Imaged portions of the intracranial compartment are unremarkable. Visualized orbits: Globes and orbits are unremarkable. Mastoids and visualized paranasal sinuses: The imaged paranasal sinuses are clear. The mastoid air cells are clear. Skeleton: There is no acute osseous abnormality or aggressive osseous lesion. Upper chest: Are patchy opacities in the lung apices, overall slightly improved compared to the CTA chest from 04/12/2021. Other: None. IMPRESSION: 1. Prominent bilateral palatine tonsils which partially efface the oropharyngeal airway but without abnormal enhancement or mass lesion. No abnormal fluid collection. 2. Subcentimeter bilateral level II lymph nodes, nonspecific and may be reactive. No pathologic lymphadenopathy in the neck. 3. Patchy opacities in the lung apices likely reflecting infection, improved compared to the CTA chest from 04/12/2021. Electronically Signed   By: Valetta Mole M.D.   On: 04/21/2021 12:04     Procedures Procedures    Medications Ordered in ED Medications  famotidine (PEPCID) IVPB 20 mg premix (has no administration in time range)  oxyCODONE (Oxy IR/ROXICODONE) immediate release tablet 5 mg (has no administration  in time range)  methylPREDNISolone sodium succinate (SOLU-MEDROL) 125 mg/2 mL injection 125 mg (125 mg Intravenous Given 04/21/21 1114)  diphenhydrAMINE (BENADRYL) injection 25 mg (25 mg Intravenous Given 04/21/21 1116)  iohexol (OMNIPAQUE) 300 MG/ML solution 75 mL (75 mLs Intravenous Contrast Given 04/21/21 1140)    ED Course/ Medical Decision Making/ A&P                           Medical Decision Making Amount and/or Complexity of Data Reviewed Labs: ordered. Radiology: ordered.  Risk Prescription drug management. Decision regarding hospitalization.   Patient with angioedema of her face and neck.  Not progressing.  She will be admitted to medicine for observation    This patient presents to the ED for concern of allergic reaction, this involves an extensive number of treatment options, and is a complaint that carries with it a high risk of complications and morbidity.  The differential diagnosis includes angioedema, tumor   Co morbidities that complicate the patient evaluation Guillain-Barr   Additional history obtained:  Additional history obtained from patient External records from outside source obtained and reviewed including hospital record   Lab Tests:  I Ordered, and personally interpreted labs.  The pertinent results include: CBC that shows elevated white count of 14.8, chemistries unremarkable liver studies elevated   Imaging Studies ordered:  I ordered imaging studies including CT of the neck I independently visualized and interpreted imaging which showed lymphadenopathy in the neck I agree with the radiologist interpretation   Cardiac Monitoring:  The patient was maintained on a cardiac monitor.  I personally viewed and  interpreted the cardiac monitored which showed an underlying rhythm of: Normal sinus rhythm   Medicines ordered and prescription drug management:  I ordered medication including steroids and Benadryl for allergic reaction Reevaluation of the patient after these medicines showed that the patient stayed the same I have reviewed the patients home medicines and have made adjustments as needed   Test Considered:  CT chest   Critical Interventions:  Steroids and Benadryl IV   Consultations Obtained:  I requested consultation with the hospitalist,  and discussed lab and imaging findings as well as pertinent plan - they recommend: Admission for observation of allergic reaction and angioedema   Problem List / ED Course:  Guillain-Barr and facial swelling   Reevaluation:  After the interventions noted above, I reevaluated the patient and found that they have :stayed the same   Social Determinants of Health:  From nursing home   Dispostion:  After consideration of the diagnostic results and the patients response to treatment, I feel that the patent would benefit from admission for angioedema and observation.         Final Clinical Impression(s) / ED Diagnoses Final diagnoses:  Angioedema, initial encounter    Rx / DC Orders ED Discharge Orders     None         Milton Ferguson, MD 04/22/21 [id number]

## 2021-04-21 NOTE — Progress Notes (Signed)
Patient has wounds to her bilat buttocks. The wounds were previously documented on patient's recent admission. They have not advanced in healing. She has many areas of pinpoint excoriation surrounding the areas. Myself and Crystal, RN cleansed and dressed the bilat sacrum wounds with gauze and foam cover dressing. There is a thick layer of protection cream in patient's folds for redness. Patient arrived to unit with pullup from nursing home, mushy stool in side. Patient was thoroughly cleansed and purewick placed.

## 2021-04-21 NOTE — H&P (Signed)
History and Physical  Carla Little [id number] DOB: 15-Apr-1990 DOA: 04/21/2021   PCP: Patient, No Pcp Per (Inactive)   Patient coming from: Home  Chief Complaint: "throat closing up"  HPI:  Carla Little is a 31 y.o. 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  Notably, patient had a hospitalization from 12/09/2020 to 01/26/2021 during which time she was treated for AIDP when she received 5 doses of IVIG.  She was subsequently discharged to SNF.  She stated that her strength gradually improved.  She continued to have dysesthesias in her arms and legs.  However, was readmitted to the hospital from 04/11/2021 to 04/17/2021 when she was treated for acute respiratory failure secondary to COVID-19 pneumonia.  During that admission, the patient received 5 days of remdesivir and steroids.  She was discharged home with prednisone which she finished on 04/16/2021.  On the next day, she began having the throat closing up sensation.  She denies any frank sore throat or dysphagia or odynophagia.  She denies any fevers, chills, chest pain, worsening shortness of breath, nausea, vomiting, diarrhea, abdominal pain.  She states that her extremity weakness has been improving up until the point where she contracted COVID-19 pneumonia.  Since then, she feels that her extremities have been weaker.  She states that her dysesthesias have been about the same since her COVID-19 pneumonia.  The patient states that she has been able to swallow pills and eat solid food without much difficulty.  There is no dysphagia or odynophagia.  She states that her face and neck have not been swollen.  She states that the Tylenol has not been swollen, but she began having numbness and tingling in her tongue on 04/20/2021. In the ED, patient was afebrile hemodynamically  stable with oxygen saturation 92% on room air.  BMP showed a sodium 137, potassium 3.5, CO2 28, serum creatinine 0.31.  AST 259, ALT 127, phosphatase 89, total bilirubin 5.3.  WBC 14.8, hemoglobin 10.9, platelets 3 98,000.  UA was negative for pyuria.  CT of the neck shows mildly prominent palatine tonsils with partial effaced oral pharyngeal airway without any abnormal fluid collection or abnormal enhancement.  The parapharyngeal spaces were clear, and tongue was normal.  There was normal salivary glands.  Neurology was consulted to assist with management.  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 -Continue supplementation        Past Medical History:  Diagnosis Date   Class 3 obesity ([place]) 12/09/2020   Depression    GERD (gastroesophageal reflux disease)    Guillain Barr syndrome ([facility])    Nonalcoholic steatohepatitis ([facility])  12/09/2020   Obesity    PUD (peptic ulcer disease) 12/09/2020   Past Surgical History:  Procedure Laterality Date   BIOPSY  11/19/2020   Procedure: BIOPSY;  Surgeon: Juanita Craver, MD;  Location: [facility];  Service: Endoscopy;;   ESOPHAGOGASTRODUODENOSCOPY N/A 01/10/2021   Procedure: ESOPHAGOGASTRODUODENOSCOPY (EGD);  Surgeon: Otis Brace, MD;  Location: [facility];  Service: Gastroenterology;  Laterality: N/A;   ESOPHAGOGASTRODUODENOSCOPY (EGD) WITH PROPOFOL N/A 11/19/2020   Procedure: ESOPHAGOGASTRODUODENOSCOPY (EGD) WITH PROPOFOL;  Surgeon: Juanita Craver, MD;  Location: [facility];  Service: Endoscopy;  Laterality: N/A;   FLEXIBLE SIGMOIDOSCOPY N/A 01/10/2021   Procedure: FLEXIBLE SIGMOIDOSCOPY;  Surgeon: Otis Brace, MD;  Location: [facility];  Service: Gastroenterology;  Laterality: N/A;   Social History:  reports that she has never smoked. She has never used smokeless tobacco. She reports that she does not currently use alcohol. She reports that she does not use drugs.   Family History  Problem Relation Age of Onset   Hypertension Other      Allergies  Allergen Reactions   Azithromycin Shortness Of Breath and Nausea And Vomiting     Prior to Admission medications   Medication Sig Start Date End Date Taking? Authorizing Provider  acetaminophen (TYLENOL) 325 MG tablet Take 2 tablets (650 mg total) by mouth every 6 (six) hours as needed for mild pain (or Fever >/= 101). 01/26/21  Yes Jennye Boroughs, MD  albuterol (VENTOLIN HFA) 108 (90 Base) MCG/ACT inhaler Inhale 1 puff into the lungs every 6 (six) hours as needed for wheezing or shortness of breath. 04/17/21  Yes Kathie Dike, MD  ascorbic acid (VITAMIN C) 500 MG tablet Take 500 mg by mouth daily.   Yes [provider]  buPROPion (WELLBUTRIN SR) 150 MG 12 hr tablet Take 150 mg by mouth daily.   Yes [provider]  busPIRone (BUSPAR) 5 MG tablet Take 1 tablet (5 mg total) by mouth 2 (two) times daily. 01/26/21  Yes Jennye Boroughs, MD  cholecalciferol (VITAMIN D) 25 MCG tablet Take 2 tablets (2,000 Units total) by mouth daily. Patient taking differently: Take  1,000 Units by mouth daily. 11/28/20  Yes Shawna Clamp, MD  ciprofloxacin (CIPRO) 500 MG tablet Take 1 tablet (500 mg total) by mouth 2 (two) times daily for 4 days. 04/17/21 04/21/21 Yes Kathie Dike, MD  cyanocobalamin (,VITAMIN B-12,) 1000 MCG/ML injection Inject 1 mL (1,000 mcg total) into the muscle every 30 (thirty) days. 01/29/21  Yes Jennye Boroughs, MD  diphenhydrAMINE (BENADRYL) 25 MG tablet Take 25 mg by mouth every 8 (eight) hours as needed for itching.   Yes [provider]  divalproex (DEPAKOTE SPRINKLE) 125 MG capsule Take 125 mg by mouth 2 (two) times daily.   Yes [provider]  folic acid (FOLVITE) 1 MG tablet Take 1 tablet (1 mg total) by mouth daily. 01/27/21  Yes Jennye Boroughs, MD  guaiFENesin-dextromethorphan (ROBITUSSIN DM) 100-10 MG/5ML syrup Take 10 mLs by mouth every 4 (four) hours as needed for cough. 04/17/21  Yes Kathie Dike, MD  hydrocortisone (ANUSOL-HC) 25 MG suppository Place 1 suppository (25 mg total) rectally 2 (two) times daily. 04/17/21  Yes Memon, Jolaine Artist, MD  lidocaine (LIDODERM) 5 % Place 1 patch onto the skin daily. Remove & Discard patch within 12 hours or as directed by MD 01/27/21  Yes Jennye Boroughs, MD  linaclotide [facility]) 290 MCG CAPS capsule Take 1 capsule (290 mcg total) by mouth daily before breakfast. 04/18/21  Yes Kathie Dike, MD  melatonin  5 MG TABS Take 10 mg by mouth at bedtime.   Yes [provider]  methocarbamol (ROBAXIN) 500 MG tablet Take 1 tablet (500 mg total) by mouth every 6 (six) hours as needed for muscle spasms. 11/28/20  Yes Shawna Clamp, MD  metoprolol tartrate (LOPRESSOR) 25 MG tablet Take 0.5 tablets (12.5 mg total) by mouth 2 (two) times daily. 04/17/21  Yes Kathie Dike, MD  omeprazole (PRILOSEC) 20 MG capsule Take 20 mg by mouth in the morning and at bedtime.   Yes [provider]  ondansetron (ZOFRAN-ODT) 4 MG disintegrating tablet Take 1 tablet (4 mg total) by mouth every 8 (eight)  hours as needed for nausea or vomiting. 04/09/21  Yes Ali, Amjad, PA-C  oxyCODONE (OXY IR/ROXICODONE) 5 MG immediate release tablet Take 5 mg by mouth every 8 (eight) hours as needed for severe pain.   Yes [provider]  phenazopyridine (PYRIDIUM) 100 MG tablet Take 1 tablet (100 mg total) by mouth 3 (three) times daily as needed for pain. 04/17/21 04/17/22 Yes Kathie Dike, MD  polyethylene glycol (MIRALAX / GLYCOLAX) 17 g packet Take 17 g by mouth 2 (two) times daily. 04/17/21  Yes Kathie Dike, MD  pregabalin (LYRICA) 75 MG capsule Take 1 capsule (75 mg total) by mouth 3 (three) times daily. 01/26/21  Yes Jennye Boroughs, MD  senna-docusate (SENOKOT-S) 8.6-50 MG tablet Take 1 tablet by mouth at bedtime as needed for mild constipation. 01/26/21  Yes Jennye Boroughs, MD  Zinc 220 (50 Zn) MG CAPS Take 1 capsule by mouth daily at 6 (six) AM. For 14 days due to covid   Yes [provider]  Nystatin (GERHARDT'S BUTT CREAM) CREA Apply 1 application topically as needed for irritation. 04/17/21   Kathie Dike, MD  predniSONE (DELTASONE) 10 MG tablet Take 20mg  po daily for 1 day then 10mg  daily for 1 day then stop Patient not taking: Reported on 04/21/2021 04/18/21   Kathie Dike, MD    Review of Systems:  Constitutional:  No weight loss, night sweats, Fevers, chills  Head&Eyes: No headache.  No vision loss.  No eye pain or scotoma ENT:  No Difficulty swallowing,Tooth/dental problems,Sore throat,  No ear ache, post nasal drip,  Cardio-vascular:  No chest pain, Orthopnea, PND, swelling in lower extremities,  dizziness, palpitations  GI:  No  abdominal pain,diarrhea, loss of appetite, hematochezia, melena, heartburn, indigestion, Resp:  No shortness of breath with exertion or at rest. No cough. No coughing up of blood .No wheezing.No chest wall deformity  Skin:  no rash or lesions.  GU:  no dysuria, change in color of urine, no urgency or frequency. No flank pain.   Musculoskeletal:  No joint pain or swelling. No decreased range of motion. No back pain.  Psych:  No change in mood or affect.  Neurologic: No headache, no dysesthesia, no focal weakness, no vision loss. No syncope  Physical Exam: Vitals:   04/21/21 1300 04/21/21 1330 04/21/21 1400 04/21/21 1719  BP: (!) 151/106 101/63 107/80 105/70  Pulse: 97 99 (!) 101 (!) 105  Resp: (!) 21 20 (!) 25 20  Temp:    99 F (37.2 C)  SpO2: 98% 97% 97% 91%  Weight:      Height:       General:  A&O x 3, NAD, nontoxic, pleasant/cooperative Head/Eye: No conjunctival hemorrhage, no icterus, [place]/AT, No nystagmus ENT:  No icterus,  No thrush, good dentition, no pharyngeal exudate Neck:  No masses, no lymphadenpathy, no bruits CV:  RRR, no rub, no gallop, no S3 Lung:  scattered bilateral ralesgood air movement, no wheeze, no rhonchi Abdomen: soft/NT, +BS, nondistended, no peritoneal signs Ext: No cyanosis, No rashes, No petechiae, No lymphangitis, Non pitting leg edema Neuro: CNII-XII intact, strength 4/5 in bilateral upper and lower extremities, no dysmetria  Labs on Admission:  Basic Metabolic Panel: Recent Labs  Lab 04/15/21 0438 04/16/21 0558 04/17/21 0447 04/21/21 1002  NA 138 141 138 137  K 4.3 3.4* 3.5 3.5  CL 105 110 102 96*  CO2 25 24 27 28   GLUCOSE 125* 90 72 78  BUN 6 7 7 6   CREATININE 0.41* 0.34* 0.30* 0.31*  CALCIUM 9.2 8.0* 8.9 9.0   Liver Function Tests: Recent Labs  Lab 04/15/21 0438 04/16/21 0558 04/17/21 0447 04/21/21 1002  AST 267* 292* 300* 259*  ALT 67* 78* 98* 127*  ALKPHOS 87 84 83 89  BILITOT 2.1* 1.7* 2.0* 5.3*  PROT 7.2 5.8* 6.3* 7.0  ALBUMIN 3.0* 2.4* 2.8* 3.0*   No results for input(s): LIPASE, AMYLASE in the last 168 hours. No results for input(s): AMMONIA in the last 168 hours. CBC: Recent Labs  Lab 04/15/21 0438 04/16/21 0558 04/21/21 1002  WBC 7.5 8.9 14.8*  NEUTROABS 5.6  --  10.8*  HGB 11.6* 10.2* 10.9*  HCT 36.2 32.4* 35.0*  MCV 83.6  84.6 85.0  PLT 572* 500* 398   Coagulation Profile: No results for input(s): INR, PROTIME in the last 168 hours. Cardiac Enzymes: No results for input(s): CKTOTAL, CKMB, CKMBINDEX, TROPONINI in the last 168 hours. BNP: Invalid input(s): POCBNP CBG: No results for input(s): GLUCAP in the last 168 hours. Urine analysis:    Component Value Date/Time   COLORURINE AMBER (A) 04/15/2021 0624   APPEARANCEUR HAZY (A) 04/15/2021 0624   LABSPEC 1.018 04/15/2021 0624   PHURINE 7.0 04/15/2021 0624   GLUCOSEU NEGATIVE 04/15/2021 0624   HGBUR MODERATE (A) 04/15/2021 0624   BILIRUBINUR NEGATIVE 04/15/2021 0624   KETONESUR NEGATIVE 04/15/2021 0624   PROTEINUR 30 (A) 04/15/2021 0624   UROBILINOGEN 1.0 10/27/2013 2116   NITRITE POSITIVE (A) 04/15/2021 0624   LEUKOCYTESUR MODERATE (A) 04/15/2021 0624   Sepsis Labs: @LABRCNTIP (procalcitonin:4,lacticidven:4) ) Recent Results (from the past 240 hour(s))  Urine Culture     Status: Abnormal   Collection Time: 04/15/21  6:24 AM   Specimen: Urine, Clean Catch  Result Value Ref Range Status   Specimen Description   Final    URINE, CLEAN CATCH Performed at [facility], [address], [place], [place] [address]    Special Requests   Final    NONE Performed at [facility], [address] [place], [place] [address]    Culture (A)  Final    >=100,000 COLONIES/mL MULTIPLE SPECIES PRESENT, SUGGEST RECOLLECTION   Report Status 04/18/2021 FINAL  Final  Resp Panel by RT-PCR (Flu A&B, Covid) Nasopharyngeal Swab     Status: Abnormal   Collection Time: 04/21/21  2:13 PM   Specimen: Nasopharyngeal Swab; Nasopharyngeal(NP) swabs in vial transport medium  Result Value Ref Range Status   SARS Coronavirus 2 by RT PCR POSITIVE (A) NEGATIVE Final    Comment: (NOTE) SARS-CoV-2 target nucleic acids are DETECTED.  The SARS-CoV-2 RNA is generally detectable in upper respiratory specimens during the acute phase of infection. Positive results are indicative of  the presence of the identified virus, but do not rule out bacterial infection or co-infection with other pathogens not detected by the test. Clinical correlation with patient history and  other diagnostic information is necessary to determine patient infection status. The expected result is Negative.  Fact Sheet for Patients: BloggerCourse.com  Fact Sheet for Healthcare Providers: SeriousBroker.it  This test is not yet approved or cleared by the [place] FDA and  has been authorized for detection and/or diagnosis of SARS-CoV-2 by FDA under an Emergency Use Authorization (EUA).  This EUA will remain in effect (meaning this test can be used) for the duration of  the COVID-19 declaration under Section 564(b)(1) of the A ct, 21 U.S.C. section 360bbb-3(b)(1), unless the authorization is terminated or revoked sooner.     Influenza A by PCR NEGATIVE NEGATIVE Final   Influenza B by PCR NEGATIVE NEGATIVE Final    Comment: (NOTE) The Xpert Xpress SARS-CoV-2/FLU/RSV plus assay is intended as an aid in the diagnosis of influenza from Nasopharyngeal swab specimens and should not be used as a sole basis for treatment. Nasal washings and aspirates are unacceptable for Xpert Xpress SARS-CoV-2/FLU/RSV testing.  Fact Sheet for Patients: BloggerCourse.com  Fact Sheet for Healthcare Providers: SeriousBroker.it  This test is not yet approved or cleared by the [place] FDA and has been authorized for detection and/or diagnosis of SARS-CoV-2 by FDA under an Emergency Use Authorization (EUA). This EUA will remain in effect (meaning this test can be used) for the duration of the COVID-19 declaration under Section 564(b)(1) of the Act, 21 U.S.C. section 360bbb-3(b)(1), unless the authorization is terminated or revoked.  Performed at [facility], [address], [place], [place] [address]       Radiological Exams on Admission: CT Soft Tissue Neck W Contrast  Result Date: 04/21/2021 CLINICAL DATA:  Possible throat swelling starting 3 days ago, difficulty swelling EXAM: CT NECK WITH CONTRAST TECHNIQUE: Multidetector CT imaging of the neck was performed using the standard protocol following the bolus administration of intravenous contrast. RADIATION DOSE REDUCTION: This exam was performed according to the departmental dose-optimization program which includes automated exposure control, adjustment of the mA and/or kV according to patient size and/or use of iterative reconstruction technique. CONTRAST:  25mL OMNIPAQUE IOHEXOL 300 MG/ML  SOLN COMPARISON:  None. FINDINGS: Pharynx and larynx: The nasal cavity and nasopharynx unremarkable. The bilateral palatine tonsils are mildly prominent and partially efface the oropharyngeal airway the without abnormal enhancement or fluid collection. The oral cavity and oropharynx are otherwise unremarkable. The parapharyngeal spaces are clear. The tongue is normal in appearance. The hypopharynx and larynx are unremarkable. The vocal folds are normal in appearance. Salivary glands: The parotid glands are unremarkable. There is fatty atrophy of the submandibular glands. Thyroid: Unremarkable. Lymph nodes: There is a 9 mm left level II lymph node and 7 mm right level II node, nonspecific and not pathologically enlarged by size criteria. There is no pathologic lymphadenopathy in the neck. Vascular: Unremarkable. Limited intracranial: Imaged portions of the intracranial compartment are unremarkable. Visualized orbits: Globes and orbits are unremarkable. Mastoids and visualized paranasal sinuses: The imaged paranasal sinuses are clear. The mastoid air cells are clear. Skeleton: There is no acute osseous abnormality or aggressive osseous lesion. Upper chest: Are patchy opacities in the lung apices, overall slightly improved compared to the CTA chest from 04/12/2021. Other:  None. IMPRESSION: 1. Prominent bilateral palatine tonsils which partially efface the oropharyngeal airway but without abnormal enhancement or mass lesion. No abnormal fluid collection. 2. Subcentimeter bilateral level II lymph nodes, nonspecific and may be reactive. No pathologic lymphadenopathy in the neck. 3. Patchy opacities in the lung apices likely reflecting infection, improved compared  to the CTA chest from 04/12/2021. Electronically Signed   By: Valetta Mole M.D.   On: 04/21/2021 12:04   MR BRAIN W WO CONTRAST  Result Date: 04/21/2021 CLINICAL DATA:  Numbness or tingling, paresthesia (Ped 0-17y) tongue paresthesias, lump in throat after covid EXAM: MRI HEAD WITHOUT AND WITH CONTRAST TECHNIQUE: Multiplanar, multiecho pulse sequences of the brain and surrounding structures were obtained without and with intravenous contrast. CONTRAST:  67mL GADAVIST GADOBUTROL 1 MMOL/ML IV SOLN COMPARISON:  12/20/2020 FINDINGS: Brain: There is no acute infarction or intracranial hemorrhage. There is no intracranial mass, mass effect, or edema. There is no hydrocephalus or extra-axial fluid collection. Ventricles and sulci are normal in size and configuration. No abnormal enhancement. Vascular: Major vessel flow voids at the skull base are preserved. Skull and upper cervical spine: Normal marrow signal is preserved. Sinuses/Orbits: Paranasal sinuses are aerated. Orbits are unremarkable. Other: Sella is unremarkable. Mastoid air cells are clear. Prominence of the palatine tonsils. IMPRESSION: No evidence of recent infarction, hemorrhage, or mass. No abnormal enhancement. Prominence of the palatine tonsils. Electronically Signed   By: Macy Mis M.D.   On: 04/21/2021 17:09        Time spent:90 minutes Code Status:   FULL Family Communication:  No Family at bedside Disposition Plan: expect 3-4 day hospitalization Consults called: neurology DVT Prophylaxis: [place] Lovenox  Orson Eva, DO  [facility] Pager  [phone]  If 7PM-7AM, please contact night-coverage [email or web address] Password [facility] 04/21/2021, 6:03 PM

## 2021-04-21 NOTE — Consult Note (Signed)
I connected with  Carla Little on 04/21/21 by a video enabled telemedicine application and verified that I am speaking with the correct person using two identifiers.   I discussed the limitations of evaluation and management by telemedicine. The patient expressed understanding and agreed to proceed.   Location of patient: [facility] Location of physician: [facility]  Neurology Consultation Reason for Consult: tongue paresthesias, lump in throat Referring Physician: Dr. Onalee Hua Tat  CC:  tongue paresthesias, lump in throat  History is obtained from: Patient, chart review  HPI: Carla Little is a 31 y.o. 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  Patient was recently seen on 04/12/2021 for COVID infection and was discharged on 04/17/2021 with prednisone taper which she completed on 04/18/2021.  Unwitnessed evening patient started noticing a lump in her throat as well as paresthesias in her tongue.  Patient denies any change in taste sensation, can feel hot and cold temperature on her tongue. Denies any facial paresthesias, vision changes, neck pain, pain during swallowing.  Due to persistent symptoms, patient came back to ED and neurology was consulted.  ROS: All other systems reviewed and negative except as noted in the HPI.   Past Medical History:  Diagnosis Date   Class 3 obesity (HCC) 12/09/2020   Depression    GERD (gastroesophageal reflux disease)    Guillain Barr syndrome (HCC)    Nonalcoholic steatohepatitis (NASH) 12/09/2020   Obesity    PUD (peptic ulcer disease) 12/09/2020    Family History  Problem Relation Age of Onset   Hypertension Other     Social History:  reports that she has never smoked. She has never used smokeless tobacco. She reports  that she does not currently use alcohol. She reports that she does not use drugs.   Exam: Current vital signs: BP 107/80    Pulse (!) 101    Temp 98.1 F (36.7 C)    Resp (!) 25    Ht 5\' 4"  (1.626 m)    Wt (!) 170.4 kg    LMP 04/09/2021 (Exact Date)    SpO2 97%    BMI 64.49 kg/m  Vital signs in last 24 hours: Temp:  [98.1 F (36.7 C)] 98.1 F (36.7 C) (01/27 0941) Pulse Rate:  [97-103] 101 (01/27 1400) Resp:  [18-25] 25 (01/27 1400) BP: (95-151)/(63-106) 107/80 (01/27 1400) SpO2:  [92 %-100 %] 97 % (01/27 1400) Weight:  [170.4 kg] 170.4 kg (01/27 1000)   Physical Exam  Constitutional: Appears well-developed and well-nourished.  Psych: Affect appropriate to situation Eyes: No scleral injection HENT: No OP obstrucion Respiratory: Effort normal, non-labored breathing Neuro: AOx3, cranial nerves II to XII grossly appears grossly intact except patient reports paresthesias of tongue, antigravity strength in bilateral upper extremities without drift, minimal antigravity strength in bilateral lower extremities since GBS  I have reviewed labs in epic and the results pertinent to this consultation are: CBC:  Recent Labs  Lab 04/15/21 0438 04/16/21 0558 04/21/21 1002  WBC 7.5 8.9 14.8*  NEUTROABS 5.6  --  10.8*  HGB 11.6* 10.2* 10.9*  HCT 36.2 32.4* 35.0*  MCV 83.6 84.6 85.0  PLT 572* 500* 398    Basic Metabolic Panel:  Lab Results  Component Value Date   NA 137 04/21/2021   K 3.5 04/21/2021  CO2 28 04/21/2021   GLUCOSE 78 04/21/2021   BUN 6 04/21/2021   CREATININE 0.31 (L) 04/21/2021   CALCIUM 9.0 04/21/2021   GFRNONAA >60 04/21/2021   GFRAA >60 05/16/2018   Lipid Panel: No results found for: LDLCALC HgbA1c:  Lab Results  Component Value Date   HGBA1C 5.0 11/02/2020   Urine Drug Screen:     Component Value Date/Time   LABOPIA NONE DETECTED 05/17/2018 0853   COCAINSCRNUR NONE DETECTED 05/17/2018 0853   LABBENZ NONE DETECTED 05/17/2018 0853   AMPHETMU NONE  DETECTED 05/17/2018 0853   THCU NONE DETECTED 05/17/2018 0853   LABBARB NONE DETECTED 05/17/2018 0853    Alcohol Level     Component Value Date/Time   ETH <10 05/16/2018 1519     I have reviewed the images obtained: CT soft tissue neck with contrast 04/21/2021:  1. Prominent bilateral palatine tonsils which partially efface the oropharyngeal airway but without abnormal enhancement or mass lesion. No abnormal fluid collection. 2. Subcentimeter bilateral level II lymph nodes, nonspecific and may be reactive. No pathologic lymphadenopathy in the neck. 3. Patchy opacities in the lung apices likely reflecting infection, improved compared to the CTA chest from 04/12/2021.   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 Arbutus Leas  Thank you for allowing [place] to participate in the care of this patient. If you have any further questions, please contact  me or neurohospitalist.   Lindie Spruce [facility]

## 2021-04-21 NOTE — ED Triage Notes (Signed)
Patient arrives via EMS from [facility] with c/o possible throat swelling starting approx. 3 days ago. She is having some difficulty swallowing but no airway obstruction noted, no swelling of tongue. HX of GBS sept 22, recently hospitalized for covid pna 2 weeks ago, reports recently taking antivirals.

## 2021-04-22 ENCOUNTER — Inpatient Hospital Stay (HOSPITAL_COMMUNITY): Payer: Medicaid Other

## 2021-04-22 DIAGNOSIS — R7401 Elevation of levels of liver transaminase levels: Secondary | ICD-10-CM

## 2021-04-22 LAB — CBC
HCT: 32.4 % — ABNORMAL LOW (ref 36.0–46.0)
Hemoglobin: 10.2 g/dL — ABNORMAL LOW (ref 12.0–15.0)
MCH: 27.1 pg (ref 26.0–34.0)
MCHC: 31.5 g/dL (ref 30.0–36.0)
MCV: 85.9 fL (ref 80.0–100.0)
Platelets: 349 10*3/uL (ref 150–400)
RBC: 3.77 MIL/uL — ABNORMAL LOW (ref 3.87–5.11)
RDW: 23.5 % — ABNORMAL HIGH (ref 11.5–15.5)
WBC: 10 10*3/uL (ref 4.0–10.5)
nRBC: 0 % (ref 0.0–0.2)

## 2021-04-22 LAB — COMPREHENSIVE METABOLIC PANEL
ALT: 128 U/L — ABNORMAL HIGH (ref 0–44)
AST: 293 U/L — ABNORMAL HIGH (ref 15–41)
Albumin: 2.9 g/dL — ABNORMAL LOW (ref 3.5–5.0)
Alkaline Phosphatase: 105 U/L (ref 38–126)
Anion gap: 13 (ref 5–15)
BUN: 7 mg/dL (ref 6–20)
CO2: 26 mmol/L (ref 22–32)
Calcium: 9.1 mg/dL (ref 8.9–10.3)
Chloride: 100 mmol/L (ref 98–111)
Creatinine, Ser: <0.3 mg/dL — ABNORMAL LOW (ref 0.44–1.00)
Glucose, Bld: 137 mg/dL — ABNORMAL HIGH (ref 70–99)
Potassium: 3.5 mmol/L (ref 3.5–5.1)
Sodium: 139 mmol/L (ref 135–145)
Total Bilirubin: 4.7 mg/dL — ABNORMAL HIGH (ref 0.3–1.2)
Total Protein: 6.7 g/dL (ref 6.5–8.1)

## 2021-04-22 LAB — GLUCOSE, CAPILLARY
Glucose-Capillary: 160 mg/dL — ABNORMAL HIGH (ref 70–99)
Glucose-Capillary: 161 mg/dL — ABNORMAL HIGH (ref 70–99)
Glucose-Capillary: 148 mg/dL — ABNORMAL HIGH (ref 70–99)

## 2021-04-22 LAB — C-REACTIVE PROTEIN: CRP: 13.4 mg/dL — ABNORMAL HIGH (ref <1.0)

## 2021-04-22 LAB — HEPATITIS C ANTIBODY: HCV Ab: NONREACTIVE

## 2021-04-22 LAB — LACTATE DEHYDROGENASE: LDH: 197 U/L — ABNORMAL HIGH (ref 98–192)

## 2021-04-22 LAB — PROTIME-INR
INR: 1.2 (ref 0.8–1.2)
Prothrombin Time: 15.5 seconds — ABNORMAL HIGH (ref 11.4–15.2)

## 2021-04-22 LAB — MAGNESIUM: Magnesium: 1.8 mg/dL (ref 1.7–2.4)

## 2021-04-22 LAB — BILIRUBIN, FRACTIONATED(TOT/DIR/INDIR)
Bilirubin, Direct: 2.6 mg/dL — ABNORMAL HIGH (ref 0.0–0.2)
Indirect Bilirubin: 2 mg/dL — ABNORMAL HIGH (ref 0.3–0.9)
Total Bilirubin: 4.6 mg/dL — ABNORMAL HIGH (ref 0.3–1.2)

## 2021-04-22 LAB — HEPATITIS B SURFACE ANTIGEN: Hepatitis B Surface Ag: NONREACTIVE

## 2021-04-22 LAB — PROCALCITONIN: Procalcitonin: 0.13 ng/mL

## 2021-04-22 LAB — FERRITIN: Ferritin: 115 ng/mL (ref 11–307)

## 2021-04-22 MED ORDER — KETOROLAC TROMETHAMINE 30 MG/ML IJ SOLN
30.0000 mg | Freq: Four times a day (QID) | INTRAMUSCULAR | Status: AC
  Administered 2021-04-22 – 2021-04-23 (×5): 30 mg via INTRAVENOUS
  Filled 2021-04-22 (×5): qty 1

## 2021-04-22 MED ORDER — METOPROLOL TARTRATE 25 MG PO TABS
12.5000 mg | ORAL_TABLET | Freq: Two times a day (BID) | ORAL | Status: DC
  Administered 2021-04-23 – 2021-04-28 (×11): 12.5 mg via ORAL
  Filled 2021-04-22 (×12): qty 1

## 2021-04-22 MED ORDER — HYDROXYZINE HCL 25 MG PO TABS
25.0000 mg | ORAL_TABLET | Freq: Three times a day (TID) | ORAL | Status: DC | PRN
  Administered 2021-04-23 – 2021-04-28 (×10): 25 mg via ORAL
  Filled 2021-04-22 (×11): qty 1

## 2021-04-22 MED ORDER — SODIUM CHLORIDE 0.9 % IV SOLN: INTRAVENOUS | Status: DC | PRN

## 2021-04-22 NOTE — Progress Notes (Signed)
PROGRESS NOTE  Carla Little [id number] DOB: 07-27-1990 DOA: 04/21/2021 PCP: Patient, No Pcp Per (Inactive)  Brief History:   31 y.o. 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  Notably, patient had a hospitalization from 12/09/2020 to 01/26/2021 during which time she was treated for AIDP when she received 5 doses of IVIG.  She was subsequently discharged to SNF.  She stated that her strength gradually improved.  She continued to have dysesthesias in her arms and legs.  However, was readmitted to the hospital from 04/11/2021 to 04/17/2021 when she was treated for acute respiratory failure secondary to COVID-19 pneumonia.  During that admission, the patient received 5 days of remdesivir and steroids.  She was discharged home with prednisone which she finished on 04/16/2021.  On the next day, she began having the throat closing up sensation.  She denies any frank sore throat or dysphagia or odynophagia.  She denies any fevers, chills, chest pain, worsening shortness of breath, nausea, vomiting, diarrhea, abdominal pain.  She states that her extremity weakness has been improving up until the point where she contracted COVID-19 pneumonia.  Since then, she feels that her extremities have been weaker.  She states that her dysesthesias have been about the same since her COVID-19 pneumonia.  The patient states that she has been able to swallow pills and eat solid food without much difficulty.  There is no dysphagia or odynophagia.  She states that her face and neck have not been swollen.  She states that the Tylenol has not been swollen, but she began having numbness and tingling in her tongue on 04/20/2021. In the ED, patient was afebrile hemodynamically stable with oxygen saturation 92% on room air.  BMP showed a sodium 137, potassium 3.5,  CO2 28, serum creatinine 0.31.  AST 259, ALT 127, phosphatase 89, total bilirubin 5.3.  WBC 14.8, hemoglobin 10.9, platelets 3 98,000.  UA was negative for pyuria.  CT of the neck shows mildly prominent palatine tonsils with partial effaced oral pharyngeal airway without any abnormal fluid collection or abnormal enhancement.  The parapharyngeal spaces were clear, and tongue was normal.  There was normal salivary glands.  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 -1/28--pt states tongue still numb but throat closing sensation is a little better -NIF neg 12; IS 1200 cc>>>continue each shift   COVID-19 pneumonia -Patient was previously treated with 5 days of remdesivir -Solu-Medrol 1000 mg daily x3 days -Oxygen saturation was 92% on room air -CRP--pending -ferritin--115 -PCT 0.13   Tranasminasemia -May be related to the patient's COVID-19 infection -Repeat right upper quadrant ultrasound -Fractionated bilirubin -Hepatitis B surface antigen -Hepatitis C antibody -Continue to trend -EBV DNA -CMV DNA -discontinue depakote -check haptoglobin   Depression/anxiety -Continue Wellbutrin and BuSpar -disContinue Depakote due to elevated LFTs   Constipation -Continue Linzess   Morbid obesity -BMI 64.49 -Lifestyle modification   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  Tongue still numb.  Hand and legs about same numbness and strength.  No n/v/d, abd pain.  Eating [place] take out food in room and tolerating well.  Objective: Vitals:   04/21/21 1400 04/21/21 1719 04/21/21 2056 04/22/21 0456  BP: 107/80 105/70 110/68 119/69  Pulse: (!) 101 (!) 105 (!) 108 (!) 103  Resp: (!) 25 20 18 16   Temp:  99 F (37.2 C) 98.4 F (36.9 C) 98 F (36.7 C)  TempSrc:   Oral Oral  SpO2: 97% 91% 98% 95%  Weight:      Height:        Intake/Output Summary (Last 24 hours) at 04/22/2021 1133 Last data filed at 04/22/2021 0900 Gross per 24 hour  Intake 530 ml  Output 500 ml  Net 30 ml   Weight change:  Exam:  General:  Pt is alert, follows commands appropriately, not in acute distress HEENT: No icterus, No thrush, No neck mass, [place]/AT Cardiovascular: RRR, S1/S2, no rubs, no gallops Respiratory: bibasilar rales.  No wheeze Abdomen: Soft/+BS, non tender, non distended, no guarding Extremities: Nonpitting edema, No lymphangitis, No petechiae, No rashes, no synovitis   Data Reviewed: I have personally reviewed following labs and imaging studies Basic Metabolic Panel: Recent Labs  Lab 04/16/21 0558 04/17/21 0447 04/21/21 1002 04/22/21 0458  NA 141 138 137 139  K 3.4* 3.5 3.5 3.5  CL 110 102 96* 100  CO2 24 27 28 26   GLUCOSE 90 72 78 137*  BUN 7 7 6 7   CREATININE 0.34* 0.30* 0.31* <0.30*  CALCIUM 8.0* 8.9 9.0 9.1  MG  --   --   --  1.8   Liver Function Tests: Recent Labs  Lab 04/16/21 0558 04/17/21 0447 04/21/21 1002 04/22/21 0458 04/22/21 1033  AST 292* 300* 259* 293*  --   ALT 78* 98* 127* 128*  --   ALKPHOS 84 83 89 105  --   BILITOT 1.7* 2.0* 5.3* 4.7* 4.6*  PROT 5.8* 6.3* 7.0 6.7  --   ALBUMIN 2.4* 2.8* 3.0* 2.9*  --    No results for input(s): LIPASE, AMYLASE in the last 168 hours. No results for input(s): AMMONIA  in the last 168 hours. Coagulation Profile: Recent Labs  Lab 04/22/21 0458  INR 1.2   CBC: Recent Labs  Lab 04/16/21 0558 04/21/21 1002 04/22/21 0458  WBC 8.9 14.8* 10.0  NEUTROABS  --  10.8*  --   HGB 10.2* 10.9* 10.2*  HCT 32.4* 35.0* 32.4*  MCV 84.6 85.0 85.9  PLT 500* 398 349   Cardiac Enzymes: No results for input(s): CKTOTAL, CKMB, CKMBINDEX, TROPONINI in the last 168 hours. BNP: Invalid input(s): POCBNP CBG: No results for input(s): GLUCAP in the last 168 hours. HbA1C: No results for input(s): HGBA1C in the last 72 hours. Urine analysis:    Component Value Date/Time   COLORURINE AMBER (A) 04/15/2021 0624   APPEARANCEUR HAZY (A) 04/15/2021 0624   LABSPEC 1.018 04/15/2021 0624   PHURINE 7.0 04/15/2021 0624   GLUCOSEU NEGATIVE 04/15/2021 0624   HGBUR MODERATE (A) 04/15/2021 0624   BILIRUBINUR NEGATIVE 04/15/2021 0624   KETONESUR NEGATIVE 04/15/2021 0624   PROTEINUR 30 (A) 04/15/2021 [id number]  UROBILINOGEN 1.0 10/27/2013 2116   NITRITE POSITIVE (A) 04/15/2021 0624   LEUKOCYTESUR MODERATE (A) 04/15/2021 0624   Sepsis Labs: @LABRCNTIP (procalcitonin:4,lacticidven:4) ) Recent Results (from the past 240 hour(s))  Urine Culture     Status: Abnormal   Collection Time: 04/15/21  6:24 AM   Specimen: Urine, Clean Catch  Result Value Ref Range Status   Specimen Description   Final    URINE, CLEAN CATCH Performed at [facility], [address] [place], [place] [address]    Special Requests   Final    NONE Performed at [facility], [address], [place], [place] [address]    Culture (A)  Final    >=100,000 COLONIES/mL MULTIPLE SPECIES PRESENT, SUGGEST RECOLLECTION   Report Status 04/18/2021 FINAL  Final  Resp Panel by RT-PCR (Flu A&B, Covid) Nasopharyngeal Swab     Status: Abnormal   Collection Time: 04/21/21  2:13 PM   Specimen: Nasopharyngeal Swab; Nasopharyngeal(NP) swabs in vial transport medium  Result Value Ref Range Status   SARS Coronavirus 2 by RT PCR  POSITIVE (A) NEGATIVE Final    Comment: (NOTE) SARS-CoV-2 target nucleic acids are DETECTED.  The SARS-CoV-2 RNA is generally detectable in upper respiratory specimens during the acute phase of infection. Positive results are indicative of the presence of the identified virus, but do not rule out bacterial infection or co-infection with other pathogens not detected by the test. Clinical correlation with patient history and other diagnostic information is necessary to determine patient infection status. The expected result is Negative.  Fact Sheet for Patients: EntrepreneurPulse.com.au  Fact Sheet for Healthcare Providers: IncredibleEmployment.be  This test is not yet approved or cleared by the [place] FDA and  has been authorized for detection and/or diagnosis of SARS-CoV-2 by FDA under an Emergency Use Authorization (EUA).  This EUA will remain in effect (meaning this test can be used) for the duration of  the COVID-19 declaration under Section 564(b)(1) of the A ct, 21 U.S.C. section 360bbb-3(b)(1), unless the authorization is terminated or revoked sooner.     Influenza A by PCR NEGATIVE NEGATIVE Final   Influenza B by PCR NEGATIVE NEGATIVE Final    Comment: (NOTE) The Xpert Xpress SARS-CoV-2/FLU/RSV plus assay is intended as an aid in the diagnosis of influenza from Nasopharyngeal swab specimens and should not be used as a sole basis for treatment. Nasal washings and aspirates are unacceptable for Xpert Xpress SARS-CoV-2/FLU/RSV testing.  Fact Sheet for Patients: EntrepreneurPulse.com.au  Fact Sheet for Healthcare Providers: IncredibleEmployment.be  This test is not yet approved or cleared by the [place] FDA and has been authorized for detection and/or diagnosis of SARS-CoV-2 by FDA under an Emergency Use Authorization (EUA). This EUA will remain in effect (meaning this test can be used)  for the duration of the COVID-19 declaration under Section 564(b)(1) of the Act, 21 U.S.C. section 360bbb-3(b)(1), unless the authorization is terminated or revoked.  Performed at [facility], [address], [place], [place] [address]      Scheduled Meds:  ascorbic acid  500 mg Oral Daily   buPROPion  150 mg Oral Daily   busPIRone  5 mg Oral BID   cholecalciferol  1,000 Units Oral Daily   enoxaparin (LOVENOX) injection  85 mg Subcutaneous [id number]   folic acid  1 mg Oral Daily   linaclotide  290 mcg Oral QAC breakfast   pantoprazole  40 mg Oral BID   polyethylene glycol  17 g Oral BID   pregabalin  75 mg  Oral TID   zinc sulfate  220 mg Oral Daily   Continuous Infusions:  sodium chloride 10 mL/hr at 04/22/21 1019   methylPREDNISolone (SOLU-MEDROL) injection 1,000 mg (04/22/21 1020)    Procedures/Studies: CT Soft Tissue Neck W Contrast  Result Date: 04/21/2021 CLINICAL DATA:  Possible throat swelling starting 3 days ago, difficulty swelling EXAM: CT NECK WITH CONTRAST TECHNIQUE: Multidetector CT imaging of the neck was performed using the standard protocol following the bolus administration of intravenous contrast. RADIATION DOSE REDUCTION: This exam was performed according to the departmental dose-optimization program which includes automated exposure control, adjustment of the mA and/or kV according to patient size and/or use of iterative reconstruction technique. CONTRAST:  36mL OMNIPAQUE IOHEXOL 300 MG/ML  SOLN COMPARISON:  None. FINDINGS: Pharynx and larynx: The nasal cavity and nasopharynx unremarkable. The bilateral palatine tonsils are mildly prominent and partially efface the oropharyngeal airway the without abnormal enhancement or fluid collection. The oral cavity and oropharynx are otherwise unremarkable. The parapharyngeal spaces are clear. The tongue is normal in appearance. The hypopharynx and larynx are unremarkable. The vocal folds are normal in appearance. Salivary glands:  The parotid glands are unremarkable. There is fatty atrophy of the submandibular glands. Thyroid: Unremarkable. Lymph nodes: There is a 9 mm left level II lymph node and 7 mm right level II node, nonspecific and not pathologically enlarged by size criteria. There is no pathologic lymphadenopathy in the neck. Vascular: Unremarkable. Limited intracranial: Imaged portions of the intracranial compartment are unremarkable. Visualized orbits: Globes and orbits are unremarkable. Mastoids and visualized paranasal sinuses: The imaged paranasal sinuses are clear. The mastoid air cells are clear. Skeleton: There is no acute osseous abnormality or aggressive osseous lesion. Upper chest: Are patchy opacities in the lung apices, overall slightly improved compared to the CTA chest from 04/12/2021. Other: None. IMPRESSION: 1. Prominent bilateral palatine tonsils which partially efface the oropharyngeal airway but without abnormal enhancement or mass lesion. No abnormal fluid collection. 2. Subcentimeter bilateral level II lymph nodes, nonspecific and may be reactive. No pathologic lymphadenopathy in the neck. 3. Patchy opacities in the lung apices likely reflecting infection, improved compared to the CTA chest from 04/12/2021. Electronically Signed   By: Valetta Mole M.D.   On: 04/21/2021 12:04   CT Angio Chest Pulmonary Embolism (PE) W or WO Contrast  Result Date: 04/12/2021 CLINICAL DATA:  Concern for pulmonary embolism. EXAM: CT ANGIOGRAPHY CHEST WITH CONTRAST TECHNIQUE: Multidetector CT imaging of the chest was performed using the standard protocol during bolus administration of intravenous contrast. Multiplanar CT image reconstructions and MIPs were obtained to evaluate the vascular anatomy. RADIATION DOSE REDUCTION: This exam was performed according to the departmental dose-optimization program which includes automated exposure control, adjustment of the mA and/or kV according to patient size and/or use of iterative  reconstruction technique. CONTRAST:  138mL OMNIPAQUE IOHEXOL 350 MG/ML SOLN COMPARISON:  CT dated 11/03/2020 and chest radiograph dated 04/12/2021. FINDINGS: Evaluation of this exam is limited due to respiratory motion artifact. Cardiovascular: Top-normal cardiac size. No pericardial effusion. The thoracic aorta is unremarkable. Evaluation of the pulmonary arteries is very limited due to severe respiratory motion artifact. No large or central pulmonary artery embolus identified. Mediastinum/Nodes: No hilar or mediastinal adenopathy. The esophagus is grossly unremarkable. No mediastinal fluid collection. Lungs/Pleura: Bilateral patchy airspace opacities most concerning for multilobar pneumonia. No pleural effusion pneumothorax. The central airways are patent. Upper Abdomen: Severe fatty liver. Musculoskeletal: No chest wall abnormality. No acute or significant osseous findings. Review of the MIP images  confirms the above findings. IMPRESSION: 1. No CT evidence of central pulmonary artery embolus. 2. Multifocal pneumonia. Clinical correlation and follow-up to resolution recommended. 3. Severe fatty liver. Electronically Signed   By: Anner Crete M.D.   On: 04/12/2021 02:21   MR BRAIN W WO CONTRAST  Result Date: 04/21/2021 CLINICAL DATA:  Numbness or tingling, paresthesia (Ped 0-17y) tongue paresthesias, lump in throat after covid EXAM: MRI HEAD WITHOUT AND WITH CONTRAST TECHNIQUE: Multiplanar, multiecho pulse sequences of the brain and surrounding structures were obtained without and with intravenous contrast. CONTRAST:  49mL GADAVIST GADOBUTROL 1 MMOL/ML IV SOLN COMPARISON:  12/20/2020 FINDINGS: Brain: There is no acute infarction or intracranial hemorrhage. There is no intracranial mass, mass effect, or edema. There is no hydrocephalus or extra-axial fluid collection. Ventricles and sulci are normal in size and configuration. No abnormal enhancement. Vascular: Major vessel flow voids at the skull base are  preserved. Skull and upper cervical spine: Normal marrow signal is preserved. Sinuses/Orbits: Paranasal sinuses are aerated. Orbits are unremarkable. Other: Sella is unremarkable. Mastoid air cells are clear. Prominence of the palatine tonsils. IMPRESSION: No evidence of recent infarction, hemorrhage, or mass. No abnormal enhancement. Prominence of the palatine tonsils. Electronically Signed   By: Macy Mis M.D.   On: 04/21/2021 17:09   DG Chest Port 1 View  Result Date: 04/12/2021 CLINICAL DATA:  Shortness of breath. EXAM: PORTABLE CHEST 1 VIEW COMPARISON:  Chest radiograph dated 01/02/2021. FINDINGS: Shallow inspiration. There is mild cardiomegaly with mild vascular congestion. Left upper lobe streaky densities may represent vascular congestion. Developing infiltrate is not excluded. Clinical correlation is recommended. No focal consolidation, pleural effusion, or pneumothorax. No acute osseous pathology. IMPRESSION: 1. Mild cardiomegaly with mild vascular congestion. 2. Left upper lobe streaky densities may represent vascular congestion versus developing infiltrate. Electronically Signed   By: Anner Crete M.D.   On: 04/12/2021 00:38    Orson Eva, DO  [facility]  If 7PM-7AM, please contact night-coverage [email or web address] Password [facility] 04/22/2021, 11:33 AM   LOS: 1 day

## 2021-04-22 NOTE — Progress Notes (Signed)
Nif was greater than negative 40.

## 2021-04-22 NOTE — Progress Notes (Signed)
°   04/22/21 1624  Assess: MEWS Score  BP 109/76  Pulse Rate (!) 118  Assess: MEWS Score  MEWS Temp 0  MEWS Systolic 0  MEWS Pulse 2  MEWS RR 0  MEWS LOC 0  MEWS Score 2  MEWS Score Color Yellow  Assess: if the MEWS score is Yellow or Red  Were vital signs taken at a resting state? Yes  Take Vital Signs  Increase Vital Sign Frequency  Yellow: Q 2hr X 2 then Q 4hr X 2, if remains yellow, continue Q 4hrs  Escalate  MEWS: Escalate Yellow: discuss with charge nurse/RN and consider discussing with provider and RRT  Document  Patient Outcome Other (Comment)  Progress note created (see row info) Yes

## 2021-04-22 NOTE — Progress Notes (Signed)
°   04/22/21 1439  Assess: MEWS Score  Temp 99 F (37.2 C)  BP 104/68  Pulse Rate (!) 114  SpO2 97 %  Assess: MEWS Score  MEWS Temp 0  MEWS Systolic 0  MEWS Pulse 2  MEWS RR 0  MEWS LOC 0  MEWS Score 2  MEWS Score Color Yellow  Assess: if the MEWS score is Yellow or Red  Were vital signs taken at a resting state? Yes  Early Detection of Sepsis Score *See Row Information* Low  MEWS guidelines implemented *See Row Information* Yes  Take Vital Signs  Increase Vital Sign Frequency  Yellow: Q 2hr X 2 then Q 4hr X 2, if remains yellow, continue Q 4hrs  Escalate  MEWS: Escalate Yellow: discuss with charge nurse/RN and consider discussing with provider and RRT  Notify: Charge Nurse/RN  Name of Charge Nurse/RN Notified [place]  Date Charge Nurse/RN Notified 04/22/21  Time Charge Nurse/RN Notified 1500  Notify: Provider  Provider Name/Title Tat  Date Provider Notified 04/22/21  Time Provider Notified 1625  Notification Type Page  Notification Reason Change in status  Provider response See new orders (Had been on metoprolo .  Dr, Tat restarted metoprolol and gave atarax for anxiety)  Date of Provider Response 04/22/21  Time of Provider Response 1625  Document  Patient Outcome Other (Comment) (will give metoprolol and atarax)  Progress note created (see row info) Yes

## 2021-04-22 NOTE — Progress Notes (Signed)
Pulse has been in 110's up to 120.  Contacted Dr. Arbutus Leas and he restarted her metoprolol and gave atarax for anxiety.

## 2021-04-22 NOTE — Progress Notes (Addendum)
Patient did on negative inspiratory force , --12 , She did on incentive about 1200 cc , breath sounds were decreased hr 107, f 20 -- saturation 83 on room air. She was placed on 2 liters. She showed no sign of distress.        Negative 12 was thought to be incorrect.

## 2021-04-23 LAB — COMPREHENSIVE METABOLIC PANEL
ALT: 181 U/L — ABNORMAL HIGH (ref 0–44)
AST: 463 U/L — ABNORMAL HIGH (ref 15–41)
Albumin: 3 g/dL — ABNORMAL LOW (ref 3.5–5.0)
Alkaline Phosphatase: 109 U/L (ref 38–126)
Anion gap: 11 (ref 5–15)
BUN: 15 mg/dL (ref 6–20)
CO2: 27 mmol/L (ref 22–32)
Calcium: 8.9 mg/dL (ref 8.9–10.3)
Chloride: 102 mmol/L (ref 98–111)
Creatinine, Ser: 0.33 mg/dL — ABNORMAL LOW (ref 0.44–1.00)
GFR, Estimated: >60 mL/min (ref >60)
Glucose, Bld: 121 mg/dL — ABNORMAL HIGH (ref 70–99)
Potassium: 3.3 mmol/L — ABNORMAL LOW (ref 3.5–5.1)
Sodium: 140 mmol/L (ref 135–145)
Total Bilirubin: 3.5 mg/dL — ABNORMAL HIGH (ref 0.3–1.2)
Total Protein: 6.7 g/dL (ref 6.5–8.1)

## 2021-04-23 LAB — GLUCOSE, CAPILLARY
Glucose-Capillary: 109 mg/dL — ABNORMAL HIGH (ref 70–99)
Glucose-Capillary: 99 mg/dL (ref 70–99)
Glucose-Capillary: 134 mg/dL — ABNORMAL HIGH (ref 70–99)
Glucose-Capillary: 152 mg/dL — ABNORMAL HIGH (ref 70–99)

## 2021-04-23 LAB — C-REACTIVE PROTEIN: CRP: 6.8 mg/dL — ABNORMAL HIGH (ref <1.0)

## 2021-04-23 LAB — CBC
HCT: 31.6 % — ABNORMAL LOW (ref 36.0–46.0)
Hemoglobin: 10 g/dL — ABNORMAL LOW (ref 12.0–15.0)
MCH: 27.1 pg (ref 26.0–34.0)
MCHC: 31.6 g/dL (ref 30.0–36.0)
MCV: 85.6 fL (ref 80.0–100.0)
Platelets: 367 10*3/uL (ref 150–400)
RBC: 3.69 MIL/uL — ABNORMAL LOW (ref 3.87–5.11)
RDW: 24 % — ABNORMAL HIGH (ref 11.5–15.5)
WBC: 10.5 10*3/uL (ref 4.0–10.5)
nRBC: 0 % (ref 0.0–0.2)

## 2021-04-23 LAB — FERRITIN: Ferritin: 84 ng/mL (ref 11–307)

## 2021-04-23 LAB — T4, FREE: Free T4: 1.23 ng/dL — ABNORMAL HIGH (ref 0.61–1.12)

## 2021-04-23 LAB — HAPTOGLOBIN: Haptoglobin: 167 mg/dL (ref 33–278)

## 2021-04-23 LAB — TSH: TSH: 1.307 u[IU]/mL (ref 0.350–4.500)

## 2021-04-23 MED ORDER — VITAMIN B-12 100 MCG PO TABS
500.0000 ug | ORAL_TABLET | Freq: Every day | ORAL | Status: DC
  Administered 2021-04-23 – 2021-04-28 (×6): 500 ug via ORAL
  Filled 2021-04-23 (×6): qty 5

## 2021-04-23 NOTE — Progress Notes (Signed)
NIF was greater than negative 40.

## 2021-04-23 NOTE — Progress Notes (Signed)
NIF negative inspiratory force greater than 40

## 2021-04-23 NOTE — Progress Notes (Addendum)
PROGRESS NOTE  Carla Little [id number] DOB: Oct 14, 1990 DOA: 04/21/2021 PCP: Patient, No Pcp Per (Inactive)  Brief History:   31 y.o. 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  Notably, patient had a hospitalization from 12/09/2020 to 01/26/2021 during which time she was treated for AIDP when she received 5 doses of IVIG.  She was subsequently discharged to SNF.  She stated that her strength gradually improved.  She continued to have dysesthesias in her arms and legs.  However, was readmitted to the hospital from 04/11/2021 to 04/17/2021 when she was treated for acute respiratory failure secondary to COVID-19 pneumonia.  During that admission, the patient received 5 days of remdesivir and steroids.  She was discharged home with prednisone which she finished on 04/16/2021.  On the next day, she began having the throat closing up sensation.  She denies any frank sore throat or dysphagia or odynophagia.  She denies any fevers, chills, chest pain, worsening shortness of breath, nausea, vomiting, diarrhea, abdominal pain.  She states that her extremity weakness has been improving up until the point where she contracted COVID-19 pneumonia.  Since then, she feels that her extremities have been weaker.  She states that her dysesthesias have been about the same since her COVID-19 pneumonia.  The patient states that she has been able to swallow pills and eat solid food without much difficulty.  There is no dysphagia or odynophagia.  She states that her face and neck have not been swollen.  She states that the Tylenol has not been swollen, but she began having numbness and tingling in her tongue on 04/20/2021. In the ED, patient was afebrile hemodynamically stable with oxygen saturation 92% on room air.  BMP showed a sodium 137, potassium 3.5,  CO2 28, serum creatinine 0.31.  AST 259, ALT 127, phosphatase 89, total bilirubin 5.3.  WBC 14.8, hemoglobin 10.9, platelets 3 98,000.  UA was negative for pyuria.  CT of the neck shows mildly prominent palatine tonsils with partial effaced oral pharyngeal airway without any abnormal fluid collection or abnormal enhancement.  The parapharyngeal spaces were clear, and tongue was normal.  There was normal salivary glands.  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 -1/29--pt states tongue still numb but throat closing sensation continues to improve -NIF neg 12; IS 1200 cc>>1445>>>continue each shift   COVID-19 pneumonia -Patient was previously treated with 5 days of remdesivir -Solu-Medrol 1000 mg daily x3 days--finished on 1/29 -Oxygen saturation was 92% on room air -CRP--13.4>>6.8 -ferritin--115>>84 -PCT 0.13   Tranasminasemia -Suspect related to the patient's COVID-19 infection -Repeat right upper quadrant ultrasound -Fractionated bilirubin--mostly direct -Hepatitis B surface antigen--neg -Hepatitis C antibody--neg -Continue to trend -EBV DNA--pending -CMV DNA--pending -discontinue depakote -check haptoglobin--pending -consult to GI -tolerating diet without abd pain   Depression/anxiety -Continue Wellbutrin and BuSpar -disContinue Depakote due to elevated LFTs   Constipation -Continue Linzess   Morbid obesity -BMI 64.49 -Lifestyle modification   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  Denies f/c, cp, n/v/d, sob.  Throat closing sensation improving.  Denies dysphagia Arm/leg strength unchanged  Objective: Vitals:   04/22/21 2100 04/23/21 0533 04/23/21 0918 04/23/21 1410  BP:  110/74  128/90  Pulse: (!) 103 (!) 105  (!) 101  Resp: 20 18  18   Temp:  97.9 F (36.6 C)  98.2 F (36.8 C)  TempSrc:    Oral  SpO2: 94% 92% 93% 98%  Weight:      Height:        Intake/Output Summary (Last 24 hours) at 04/23/2021 1552 Last data filed at 04/23/2021 1500 Gross per 24 hour  Intake 1080 ml  Output --  Net 1080 ml   Weight change:  Exam:  General:  Pt is alert, follows commands appropriately, not in acute distress HEENT: No icterus, No thrush, No neck mass, [place]/AT Cardiovascular: RRR, S1/S2, no rubs, no gallops Respiratory: fine bibasilar crackles no wheeze Abdomen: Soft/+BS, non tender, non distended, no guarding Extremities: Nonpitting  edema, No lymphangitis, No petechiae, No rashes, no synovitis   Data Reviewed: I have personally reviewed following labs and imaging studies Basic Metabolic Panel: Recent Labs  Lab 04/17/21 0447 04/21/21 1002 04/22/21 0458 04/23/21 0556  NA 138 137 139 140  K 3.5 3.5 3.5 3.3*  CL 102 96* 100 102  CO2 27 28 26 27   GLUCOSE 72 78 137* 121*  BUN 7 6 7 15   CREATININE 0.30* 0.31* <0.30* 0.33*  CALCIUM 8.9 9.0 9.1 8.9  MG  --   --  1.8  --    Liver Function Tests: Recent Labs  Lab 04/17/21 0447 04/21/21 1002 04/22/21 0458 04/22/21 1033 04/23/21 0556  AST 300* 259* 293*  --  463*  ALT 98* 127* 128*  --  181*  ALKPHOS 83 89 105  --  109  BILITOT 2.0* 5.3* 4.7* 4.6* 3.5*  PROT 6.3* 7.0 6.7  --  6.7  ALBUMIN 2.8* 3.0* 2.9*  --  3.0*   No results for input(s):  LIPASE, AMYLASE in the last 168 hours. No results for input(s): AMMONIA in the last 168 hours. Coagulation Profile: Recent Labs  Lab 04/22/21 0458  INR 1.2   CBC: Recent Labs  Lab 04/21/21 1002 04/22/21 0458 04/23/21 0556  WBC 14.8* 10.0 10.5  NEUTROABS 10.8*  --   --   HGB 10.9* 10.2* 10.0*  HCT 35.0* 32.4* 31.6*  MCV 85.0 85.9 85.6  PLT 398 349 367   Cardiac Enzymes: No results for input(s): CKTOTAL, CKMB, CKMBINDEX, TROPONINI in the last 168 hours. BNP: Invalid input(s): POCBNP CBG: Recent Labs  Lab 04/22/21 1136 04/22/21 1707 04/22/21 2156 04/23/21 0756 04/23/21 1200  GLUCAP 160* 161* 148* 109* 99   HbA1C: No results for input(s): HGBA1C in the last 72 hours. Urine analysis:    Component Value Date/Time   COLORURINE AMBER (A) 04/15/2021 0624   APPEARANCEUR HAZY (A) 04/15/2021 0624   LABSPEC 1.018 04/15/2021 0624   PHURINE 7.0 04/15/2021 0624   GLUCOSEU NEGATIVE 04/15/2021 0624   HGBUR MODERATE (A) 04/15/2021 0624   BILIRUBINUR NEGATIVE  04/15/2021 [place] 04/15/2021 0624   PROTEINUR 30 (A) 04/15/2021 0624   UROBILINOGEN 1.0 10/27/2013 2116   NITRITE POSITIVE (A) 04/15/2021 0624   LEUKOCYTESUR MODERATE (A) 04/15/2021 0624   Sepsis Labs: @LABRCNTIP (procalcitonin:4,lacticidven:4) ) Recent Results (from the past 240 hour(s))  Urine Culture     Status: Abnormal   Collection Time: 04/15/21  6:24 AM   Specimen: Urine, Clean Catch  Result Value Ref Range Status   Specimen Description   Final    URINE, CLEAN CATCH Performed at [facility], [address] [place], [place] [address]    Special Requests   Final    NONE Performed at [facility], [address] [place], [place] [address]    Culture (A)  Final    >=100,000 COLONIES/mL MULTIPLE SPECIES PRESENT, SUGGEST RECOLLECTION   Report Status 04/18/2021 FINAL  Final  Resp Panel by RT-PCR (Flu A&B, Covid) Nasopharyngeal Swab     Status: Abnormal   Collection Time: 04/21/21  2:13 PM    Specimen: Nasopharyngeal Swab; Nasopharyngeal(NP) swabs in vial transport medium  Result Value Ref Range Status   SARS Coronavirus 2 by RT PCR POSITIVE (A) NEGATIVE Final    Comment: (NOTE) SARS-CoV-2 target nucleic acids are DETECTED.  The SARS-CoV-2 RNA is generally detectable in upper respiratory specimens during the acute phase of infection. Positive results are indicative of the presence of the identified virus, but do not rule out bacterial infection or co-infection with other pathogens not detected by the test. Clinical correlation with patient history and other diagnostic information is necessary to determine patient infection status. The expected result is Negative.  Fact Sheet for Patients: EntrepreneurPulse.com.au  Fact Sheet for Healthcare Providers: IncredibleEmployment.be  This test is not yet approved or cleared by the [place] FDA and  has been authorized for detection and/or diagnosis of SARS-CoV-2 by FDA under an Emergency Use Authorization (EUA).  This EUA will remain in effect (meaning this test can be used) for the duration of  the COVID-19 declaration under Section 564(b)(1) of the A ct, 21 U.S.C. section 360bbb-3(b)(1), unless the authorization is terminated or revoked sooner.     Influenza A by PCR NEGATIVE NEGATIVE Final   Influenza B by PCR NEGATIVE NEGATIVE Final    Comment: (NOTE) The Xpert Xpress SARS-CoV-2/FLU/RSV plus assay is intended as an aid in the diagnosis of influenza from Nasopharyngeal swab specimens and should not be used as a sole basis for treatment. Nasal washings and aspirates are unacceptable for Xpert Xpress SARS-CoV-2/FLU/RSV testing.  Fact Sheet for Patients: EntrepreneurPulse.com.au  Fact Sheet for Healthcare Providers: IncredibleEmployment.be  This test is not yet approved or cleared by the [place] FDA and has been authorized for detection  and/or diagnosis of SARS-CoV-2 by FDA under an Emergency Use Authorization (EUA). This EUA will remain in effect (meaning this test can be used) for the duration of the COVID-19 declaration under Section 564(b)(1) of the Act, 21 U.S.C. section 360bbb-3(b)(1), unless the authorization is terminated or revoked.  Performed at [facility], [address] [place], [place] [address]      Scheduled Meds:  ascorbic acid  500 mg Oral Daily   buPROPion  150 mg Oral Daily   busPIRone  5 mg Oral BID   cholecalciferol  1,000 Units Oral Daily   enoxaparin (LOVENOX) injection  85 mg Subcutaneous [id number]   folic acid  1 mg Oral Daily   linaclotide  290 mcg Oral QAC breakfast   metoprolol tartrate  12.5  mg Oral BID   pantoprazole  40 mg Oral BID   polyethylene glycol  17 g Oral BID   pregabalin  75 mg Oral TID   vitamin B-12  500 mcg Oral Daily   zinc sulfate  220 mg Oral Daily   Continuous Infusions:  sodium chloride 10 mL/hr at 04/22/21 1019    Procedures/Studies: CT Soft Tissue Neck W Contrast  Result Date: 04/21/2021 CLINICAL DATA:  Possible throat swelling starting 3 days ago, difficulty swelling EXAM: CT NECK WITH CONTRAST TECHNIQUE: Multidetector CT imaging of the neck was performed using the standard protocol following the bolus administration of intravenous contrast. RADIATION DOSE REDUCTION: This exam was performed according to the departmental dose-optimization program which includes automated exposure control, adjustment of the mA and/or kV according to patient size and/or use of iterative reconstruction technique. CONTRAST:  48mL OMNIPAQUE IOHEXOL 300 MG/ML  SOLN COMPARISON:  None. FINDINGS: Pharynx and larynx: The nasal cavity and nasopharynx unremarkable. The bilateral palatine tonsils are mildly prominent and partially efface the oropharyngeal airway the without abnormal enhancement or fluid collection. The oral cavity and oropharynx are otherwise unremarkable. The parapharyngeal spaces  are clear. The tongue is normal in appearance. The hypopharynx and larynx are unremarkable. The vocal folds are normal in appearance. Salivary glands: The parotid glands are unremarkable. There is fatty atrophy of the submandibular glands. Thyroid: Unremarkable. Lymph nodes: There is a 9 mm left level II lymph node and 7 mm right level II node, nonspecific and not pathologically enlarged by size criteria. There is no pathologic lymphadenopathy in the neck. Vascular: Unremarkable. Limited intracranial: Imaged portions of the intracranial compartment are unremarkable. Visualized orbits: Globes and orbits are unremarkable. Mastoids and visualized paranasal sinuses: The imaged paranasal sinuses are clear. The mastoid air cells are clear. Skeleton: There is no acute osseous abnormality or aggressive osseous lesion. Upper chest: Are patchy opacities in the lung apices, overall slightly improved compared to the CTA chest from 04/12/2021. Other: None. IMPRESSION: 1. Prominent bilateral palatine tonsils which partially efface the oropharyngeal airway but without abnormal enhancement or mass lesion. No abnormal fluid collection. 2. Subcentimeter bilateral level II lymph nodes, nonspecific and may be reactive. No pathologic lymphadenopathy in the neck. 3. Patchy opacities in the lung apices likely reflecting infection, improved compared to the CTA chest from 04/12/2021. Electronically Signed   By: Lesia Hausen M.D.   On: 04/21/2021 12:04   CT Angio Chest Pulmonary Embolism (PE) W or WO Contrast  Result Date: 04/12/2021 CLINICAL DATA:  Concern for pulmonary embolism. EXAM: CT ANGIOGRAPHY CHEST WITH CONTRAST TECHNIQUE: Multidetector CT imaging of the chest was performed using the standard protocol during bolus administration of intravenous contrast. Multiplanar CT image reconstructions and MIPs were obtained to evaluate the vascular anatomy. RADIATION DOSE REDUCTION: This exam was performed according to the departmental  dose-optimization program which includes automated exposure control, adjustment of the mA and/or kV according to patient size and/or use of iterative reconstruction technique. CONTRAST:  OMNIPAQUE IOHEXOL 350 MG/ML SOLN COMPARISON:  CT dated 11/03/2020 and chest radiograph dated 04/12/2021. FINDINGS: Evaluation of this exam is limited due to respiratory motion artifact. Cardiovascular: Top-normal cardiac size. No pericardial effusion. The thoracic aorta is unremarkable. Evaluation of the pulmonary arteries is very limited due to severe respiratory motion artifact. No large or central pulmonary artery embolus identified. Mediastinum/Nodes: No hilar or mediastinal adenopathy. The esophagus is grossly unremarkable. No mediastinal fluid collection. Lungs/Pleura: Bilateral patchy airspace opacities most concerning for multilobar pneumonia. No pleural effusion pneumothorax.  The central airways are patent. Upper Abdomen: Severe fatty liver. Musculoskeletal: No chest wall abnormality. No acute or significant osseous findings. Review of the MIP images confirms the above findings. IMPRESSION: 1. No CT evidence of central pulmonary artery embolus. 2. Multifocal pneumonia. Clinical correlation and follow-up to resolution recommended. 3. Severe fatty liver. Electronically Signed   By: Anner Crete M.D.   On: 04/12/2021 02:21   MR BRAIN W WO CONTRAST  Result Date: 04/21/2021 CLINICAL DATA:  Numbness or tingling, paresthesia (Ped 0-17y) tongue paresthesias, lump in throat after covid EXAM: MRI HEAD WITHOUT AND WITH CONTRAST TECHNIQUE: Multiplanar, multiecho pulse sequences of the brain and surrounding structures were obtained without and with intravenous contrast. CONTRAST:  21mL GADAVIST GADOBUTROL 1 MMOL/ML IV SOLN COMPARISON:  12/20/2020 FINDINGS: Brain: There is no acute infarction or intracranial hemorrhage. There is no intracranial mass, mass effect, or edema. There is no hydrocephalus or extra-axial fluid  collection. Ventricles and sulci are normal in size and configuration. No abnormal enhancement. Vascular: Major vessel flow voids at the skull base are preserved. Skull and upper cervical spine: Normal marrow signal is preserved. Sinuses/Orbits: Paranasal sinuses are aerated. Orbits are unremarkable. Other: Sella is unremarkable. Mastoid air cells are clear. Prominence of the palatine tonsils. IMPRESSION: No evidence of recent infarction, hemorrhage, or mass. No abnormal enhancement. Prominence of the palatine tonsils. Electronically Signed   By: Macy Mis M.D.   On: 04/21/2021 17:09   DG Chest Port 1 View  Result Date: 04/12/2021 CLINICAL DATA:  Shortness of breath. EXAM: PORTABLE CHEST 1 VIEW COMPARISON:  Chest radiograph dated 01/02/2021. FINDINGS: Shallow inspiration. There is mild cardiomegaly with mild vascular congestion. Left upper lobe streaky densities may represent vascular congestion. Developing infiltrate is not excluded. Clinical correlation is recommended. No focal consolidation, pleural effusion, or pneumothorax. No acute osseous pathology. IMPRESSION: 1. Mild cardiomegaly with mild vascular congestion. 2. Left upper lobe streaky densities may represent vascular congestion versus developing infiltrate. Electronically Signed   By: Anner Crete M.D.   On: 04/12/2021 00:38   US Abdomen Limited RUQ (LIVER/GB)  Result Date: 04/22/2021 CLINICAL DATA:  Abnormal liver function tests EXAM: ULTRASOUND ABDOMEN LIMITED RIGHT UPPER QUADRANT COMPARISON:  01/03/2021 FINDINGS: Gallbladder: There are hyperechoic foci in the dependent portion of gallbladder suggesting gallbladder stones. There is no definite demonstrable intraluminal mobility in the submitted images. There is no wall thickening. Technologist did not observe any tenderness over the gallbladder. Common bile duct: Diameter: 3 mm Liver: There is increased echogenicity suggesting fatty infiltration. No focal abnormality is seen in the  visualized portions of liver. Portal vein is patent on color Doppler imaging with normal direction of blood flow towards the liver. Other: None. IMPRESSION: Linear hyperechoic focus seen in the posterior margin of gallbladder may suggest layering of small stones or calcification in the gallbladder wall. There are no signs of acute cholecystitis. Fatty liver. Electronically Signed   By: Elmer Picker M.D.   On: 04/22/2021 12:12    Orson Eva, DO  [facility]  If 7PM-7AM, please contact night-coverage [email or web address] Password [email or web address] 04/23/2021, 3:52 PM   LOS: 2 days

## 2021-04-23 NOTE — Progress Notes (Signed)
Attempted to obtain -NIF at 1445 ( patient was eating lunch and  having phone conversation) and 1526 (still on phone.) -NIF will be obtained at a later time.

## 2021-04-24 ENCOUNTER — Inpatient Hospital Stay (HOSPITAL_COMMUNITY): Payer: Medicaid Other

## 2021-04-24 ENCOUNTER — Encounter (HOSPITAL_COMMUNITY): Payer: Self-pay | Admitting: Internal Medicine

## 2021-04-24 DIAGNOSIS — K7581 Nonalcoholic steatohepatitis (NASH): Secondary | ICD-10-CM

## 2021-04-24 DIAGNOSIS — G610 Guillain-Barre syndrome: Principal | ICD-10-CM

## 2021-04-24 DIAGNOSIS — R7401 Elevation of levels of liver transaminase levels: Secondary | ICD-10-CM

## 2021-04-24 LAB — COMPREHENSIVE METABOLIC PANEL
ALT: 252 U/L — ABNORMAL HIGH (ref 0–44)
AST: 626 U/L — ABNORMAL HIGH (ref 15–41)
Albumin: 3 g/dL — ABNORMAL LOW (ref 3.5–5.0)
Alkaline Phosphatase: 106 U/L (ref 38–126)
Anion gap: 11 (ref 5–15)
BUN: 16 mg/dL (ref 6–20)
CO2: 28 mmol/L (ref 22–32)
Calcium: 9 mg/dL (ref 8.9–10.3)
Chloride: 99 mmol/L (ref 98–111)
Creatinine, Ser: 0.34 mg/dL — ABNORMAL LOW (ref 0.44–1.00)
GFR, Estimated: >60 mL/min (ref >60)
Glucose, Bld: 96 mg/dL (ref 70–99)
Potassium: 3.4 mmol/L — ABNORMAL LOW (ref 3.5–5.1)
Sodium: 138 mmol/L (ref 135–145)
Total Bilirubin: 3.8 mg/dL — ABNORMAL HIGH (ref 0.3–1.2)
Total Protein: 6.7 g/dL (ref 6.5–8.1)

## 2021-04-24 LAB — CBC
HCT: 30.5 % — ABNORMAL LOW (ref 36.0–46.0)
Hemoglobin: 9.4 g/dL — ABNORMAL LOW (ref 12.0–15.0)
MCH: 26.9 pg (ref 26.0–34.0)
MCHC: 30.8 g/dL (ref 30.0–36.0)
MCV: 87.1 fL (ref 80.0–100.0)
Platelets: 325 10*3/uL (ref 150–400)
RBC: 3.5 MIL/uL — ABNORMAL LOW (ref 3.87–5.11)
RDW: 24.1 % — ABNORMAL HIGH (ref 11.5–15.5)
WBC: 11.3 10*3/uL — ABNORMAL HIGH (ref 4.0–10.5)
nRBC: 0 % (ref 0.0–0.2)

## 2021-04-24 LAB — C-REACTIVE PROTEIN: CRP: 3.8 mg/dL — ABNORMAL HIGH (ref <1.0)

## 2021-04-24 LAB — GLUCOSE, CAPILLARY
Glucose-Capillary: 111 mg/dL — ABNORMAL HIGH (ref 70–99)
Glucose-Capillary: 107 mg/dL — ABNORMAL HIGH (ref 70–99)
Glucose-Capillary: 90 mg/dL (ref 70–99)
Glucose-Capillary: 110 mg/dL — ABNORMAL HIGH (ref 70–99)

## 2021-04-24 LAB — PROTIME-INR
INR: 1.2 (ref 0.8–1.2)
Prothrombin Time: 14.7 seconds (ref 11.4–15.2)

## 2021-04-24 LAB — FERRITIN: Ferritin: 115 ng/mL (ref 11–307)

## 2021-04-24 MED ORDER — IMMUNE GLOBULIN (HUMAN) 10 GM/100ML IV SOLN
400.0000 mg/kg | INTRAVENOUS | Status: DC
  Administered 2021-04-24 – 2021-04-27 (×4): 40 g via INTRAVENOUS
  Filled 2021-04-24 (×5): qty 400

## 2021-04-24 MED ORDER — PREDNISONE 20 MG PO TABS
20.0000 mg | ORAL_TABLET | Freq: Every day | ORAL | Status: AC
  Administered 2021-04-28: 20 mg via ORAL
  Filled 2021-04-24: qty 1

## 2021-04-24 MED ORDER — PREDNISONE 20 MG PO TABS
40.0000 mg | ORAL_TABLET | Freq: Every day | ORAL | Status: AC
  Administered 2021-04-26: 40 mg via ORAL
  Filled 2021-04-24: qty 2

## 2021-04-24 MED ORDER — PREDNISONE 20 MG PO TABS
50.0000 mg | ORAL_TABLET | Freq: Every day | ORAL | Status: AC
  Administered 2021-04-25: 50 mg via ORAL
  Filled 2021-04-24: qty 1

## 2021-04-24 MED ORDER — PREDNISONE 10 MG PO TABS: 10.0000 mg | ORAL_TABLET | Freq: Every day | ORAL | Status: DC

## 2021-04-24 MED ORDER — PREDNISONE 20 MG PO TABS
30.0000 mg | ORAL_TABLET | Freq: Every day | ORAL | Status: AC
  Administered 2021-04-27: 30 mg via ORAL
  Filled 2021-04-24: qty 1

## 2021-04-24 MED ORDER — GADOBUTROL 1 MMOL/ML IV SOLN
10.0000 mL | Freq: Once | INTRAVENOUS | Status: AC | PRN
  Administered 2021-04-24: 10 mL via INTRAVENOUS

## 2021-04-24 NOTE — Consult Note (Signed)
Referring Provider: Dr. Arbutus Leas  Primary Care Physician:  Patient, No Pcp Per (Inactive) Primary Gastroenterologist:  unassigned. Previously seen while inpatient by Dr. Elnoria Howard.   Date of Admission: 04/21/21 Date of Consultation: 04/24/21  Reason for Consultation:  Elevated LFTs  HPI:  Carla Little is a 31 y.o. 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 She has been residing at [facility] since Nov 2023. Hospitalized 1/17-1/23 due to Covid. Treated previously with 5 days of remdesivir. 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    With Covid, she initially had N/V and significant constipation. This has resolved now.. No abdominal pain. No constipation now. She is on Linzess 290 mcg daily. She has no overt GI bleeding. No mental status changes or confusion. No known prior history of liver disease. No herbal supplements. No family history of liver disease.    Jump in transaminases started in Sept 2022. Prior to this, just mild elevation of AST. Starting in October, she has AST in the 300-400, ALT in the low 100s. AST today 626, ALT 252, tbili 3.8. Fractionated bilirubin 04/22/21 with predominantly direct at 2.6. Denies ETOH intake. INR 1.2. US abdomen with fatty liver this admission. EBV, CMV in process. Hep C antibody and Hep B surface antigen negative.     Colonoscopy attempted Oct 2022 by Dr. Levora Angel but prep was poor. Stool in rectum and rectosigmoid colon.  EGD Aug 2022: LA Grade B reflux esophagitis, one small non-bleeding gastric ulcer in antrum s/p biopsy. Negative h.pylori.  EGD oct 2022: normal.     Past Medical History:  Diagnosis Date   Class 3 obesity (HCC) 12/09/2020   Depression    GERD (gastroesophageal reflux disease)    Guillain Barr syndrome (HCC)    Nonalcoholic steatohepatitis (NASH)  12/09/2020   Obesity    PUD (peptic ulcer disease) 12/09/2020    Past Surgical History:  Procedure Laterality Date   BIOPSY  11/19/2020   Procedure: BIOPSY;  Surgeon: Charna Elizabeth, MD;  Location: [facility];  Service: Endoscopy;;   ESOPHAGOGASTRODUODENOSCOPY N/A 01/10/2021   Procedure: ESOPHAGOGASTRODUODENOSCOPY (EGD);  Surgeon: Kathi Der, MD;  Location: [facility];  Service: Gastroenterology;  Laterality: N/A;   ESOPHAGOGASTRODUODENOSCOPY (EGD) WITH PROPOFOL N/A 11/19/2020   Procedure: ESOPHAGOGASTRODUODENOSCOPY (EGD) WITH PROPOFOL;  Surgeon: Charna Elizabeth, MD;  Location: [facility];  Service: Endoscopy;  Laterality: N/A;   FLEXIBLE SIGMOIDOSCOPY N/A 01/10/2021   Procedure: FLEXIBLE SIGMOIDOSCOPY;  Surgeon: Kathi Der, MD;  Location: [facility];  Service: Gastroenterology;  Laterality: N/A;    Prior to Admission medications   Medication Sig Start Date End Date Taking? Authorizing Provider  acetaminophen (TYLENOL) 325 MG tablet Take 2 tablets (650 mg total) by mouth every 6 (six) hours as needed for mild pain (or Fever >/= 101). 01/26/21  Yes Lurene Shadow, MD  albuterol (VENTOLIN HFA) 108 (90 Base) MCG/ACT inhaler Inhale 1 puff into the lungs every 6 (six) hours as needed for wheezing or shortness of breath. 04/17/21  Yes Erick Blinks, MD  ascorbic acid (VITAMIN C) 500 MG tablet Take 500 mg by mouth daily.   Yes [provider]  buPROPion (WELLBUTRIN SR) 150 MG 12 hr tablet Take 150 mg by mouth daily.   Yes [provider]  busPIRone (BUSPAR) 5 MG tablet Take 1 tablet (5 mg total) by mouth 2 (two) times daily. 01/26/21  Yes  Jennye Boroughs, MD  cholecalciferol (VITAMIN D) 25 MCG tablet Take 2 tablets (2,000 Units total) by mouth daily. Patient taking differently: Take 1,000 Units by mouth daily. 11/28/20  Yes Shawna Clamp, MD  cyanocobalamin (,VITAMIN B-12,) 1000 MCG/ML injection Inject 1 mL (1,000 mcg total) into the muscle every 30 (thirty) days.  01/29/21  Yes Jennye Boroughs, MD  diphenhydrAMINE (BENADRYL) 25 MG tablet Take 25 mg by mouth every 8 (eight) hours as needed for itching.   Yes [provider]  divalproex (DEPAKOTE SPRINKLE) 125 MG capsule Take 125 mg by mouth 2 (two) times daily.   Yes [provider]  folic acid (FOLVITE) 1 MG tablet Take 1 tablet (1 mg total) by mouth daily. 01/27/21  Yes Jennye Boroughs, MD  guaiFENesin-dextromethorphan (ROBITUSSIN DM) 100-10 MG/5ML syrup Take 10 mLs by mouth every 4 (four) hours as needed for cough. 04/17/21  Yes Kathie Dike, MD  hydrocortisone (ANUSOL-HC) 25 MG suppository Place 1 suppository (25 mg total) rectally 2 (two) times daily. 04/17/21  Yes Memon, Jolaine Artist, MD  lidocaine (LIDODERM) 5 % Place 1 patch onto the skin daily. Remove & Discard patch within 12 hours or as directed by MD 01/27/21  Yes Jennye Boroughs, MD  linaclotide [facility]) 290 MCG CAPS capsule Take 1 capsule (290 mcg total) by mouth daily before breakfast. 04/18/21  Yes Memon, Jolaine Artist, MD  melatonin 5 MG TABS Take 10 mg by mouth at bedtime.   Yes [provider]  methocarbamol (ROBAXIN) 500 MG tablet Take 1 tablet (500 mg total) by mouth every 6 (six) hours as needed for muscle spasms. 11/28/20  Yes Shawna Clamp, MD  metoprolol tartrate (LOPRESSOR) 25 MG tablet Take 0.5 tablets (12.5 mg total) by mouth 2 (two) times daily. 04/17/21  Yes Kathie Dike, MD  omeprazole (PRILOSEC) 20 MG capsule Take 20 mg by mouth in the morning and at bedtime.   Yes [provider]  ondansetron (ZOFRAN-ODT) 4 MG disintegrating tablet Take 1 tablet (4 mg total) by mouth every 8 (eight) hours as needed for nausea or vomiting. 04/09/21  Yes Ali, Amjad, PA-C  oxyCODONE (OXY IR/ROXICODONE) 5 MG immediate release tablet Take 5 mg by mouth every 8 (eight) hours as needed for severe pain.   Yes [provider]  phenazopyridine (PYRIDIUM) 100 MG tablet Take 1 tablet (100 mg total) by mouth 3 (three) times daily  as needed for pain. 04/17/21 04/17/22 Yes Kathie Dike, MD  polyethylene glycol (MIRALAX / GLYCOLAX) 17 g packet Take 17 g by mouth 2 (two) times daily. 04/17/21  Yes Kathie Dike, MD  pregabalin (LYRICA) 75 MG capsule Take 1 capsule (75 mg total) by mouth 3 (three) times daily. 01/26/21  Yes Jennye Boroughs, MD  senna-docusate (SENOKOT-S) 8.6-50 MG tablet Take 1 tablet by mouth at bedtime as needed for mild constipation. 01/26/21  Yes Jennye Boroughs, MD  Zinc 220 (50 Zn) MG CAPS Take 1 capsule by mouth daily at 6 (six) AM. For 14 days due to covid   Yes [provider]  Nystatin (GERHARDT'S BUTT CREAM) CREA Apply 1 application topically as needed for irritation. 04/17/21   Kathie Dike, MD  predniSONE (DELTASONE) 10 MG tablet Take 20mg  po daily for 1 day then 10mg  daily for 1 day then stop Patient not taking: Reported on 04/21/2021 04/18/21   Kathie Dike, MD    Current Facility-Administered Medications  Medication Dose Route Frequency Provider Last Rate Last Admin   0.9 %  sodium chloride infusion   Intravenous PRN Tat,  Shanon Brow, MD 10 mL/hr at 04/22/21 1019 New Bag at 04/22/21 1019   acetaminophen (TYLENOL) tablet 650 mg  650 mg Oral Q6H PRN Orson Eva, MD   650 mg at 04/22/21 1005   Or   acetaminophen (TYLENOL) suppository 650 mg  650 mg Rectal Q6H PRN Tat, Shanon Brow, MD       ascorbic acid (VITAMIN C) tablet 500 mg  500 mg Oral Daily Tat, David, MD   500 mg at 04/24/21 1016   buPROPion (WELLBUTRIN SR) 12 hr tablet 150 mg  150 mg Oral Daily Tat, Shanon Brow, MD   150 mg at 04/24/21 1016   busPIRone (BUSPAR) tablet 5 mg  5 mg Oral BID Tat, Shanon Brow, MD   5 mg at 04/24/21 1016   cholecalciferol (VITAMIN D3) tablet 1,000 Units  1,000 Units Oral Daily Tat, Shanon Brow, MD   1,000 Units at 04/24/21 1016   enoxaparin (LOVENOX) injection 85 mg  85 mg Subcutaneous Q24H Orson Eva, MD   85 mg at [id number]   folic acid (FOLVITE) tablet 1 mg  1 mg Oral Daily Tat, David, MD   1 mg at 04/24/21 1016    hydrOXYzine (ATARAX) tablet 25 mg  25 mg Oral TID PRN Orson Eva, MD   25 mg at 04/23/21 2354   Immune Globulin 10% (PRIVIGEN) IV infusion 40 g  400 mg/kg (Adjusted) Intravenous Q24 Hr x 5 Lora Havens, MD       linaclotide (LINZESS) capsule 290 mcg  290 mcg Oral QAC breakfast Tat, Shanon Brow, MD   290 mcg at 04/24/21 1016   melatonin tablet 9 mg  9 mg Oral QHS PRN Adefeso, Oladapo, DO   9 mg at 04/23/21 2354   methocarbamol (ROBAXIN) tablet 500 mg  500 mg Oral Q6H PRN Tat, Shanon Brow, MD       metoprolol tartrate (LOPRESSOR) tablet 12.5 mg  12.5 mg Oral BID Tat, Shanon Brow, MD   12.5 mg at 04/24/21 1017   ondansetron (ZOFRAN) tablet 4 mg  4 mg Oral Q6H PRN Tat, Shanon Brow, MD       Or   ondansetron (ZOFRAN) injection 4 mg  4 mg Intravenous Q6H PRN Tat, Shanon Brow, MD       pantoprazole (PROTONIX) EC tablet 40 mg  40 mg Oral BID Tat, Shanon Brow, MD   40 mg at 04/24/21 1016   polyethylene glycol (MIRALAX / GLYCOLAX) packet 17 g  17 g Oral BID Tat, Shanon Brow, MD       Derrill Memo ON 04/25/2021] predniSONE (DELTASONE) tablet 50 mg  50 mg Oral Q breakfast Lora Havens, MD       Followed by   Derrill Memo ON 04/26/2021] predniSONE (DELTASONE) tablet 40 mg  40 mg Oral Q breakfast Lora Havens, MD       Followed by   Derrill Memo ON 04/27/2021] predniSONE (DELTASONE) tablet 30 mg  30 mg Oral Q breakfast Lora Havens, MD       Followed by   Derrill Memo ON 04/28/2021] predniSONE (DELTASONE) tablet 20 mg  20 mg Oral Q breakfast Lora Havens, MD       Followed by   Derrill Memo ON 04/29/2021] predniSONE (DELTASONE) tablet 10 mg  10 mg Oral Q breakfast Lora Havens, MD       pregabalin (LYRICA) capsule 75 mg  75 mg Oral TID Orson Eva, MD   75 mg at 04/24/21 1016   senna-docusate (Senokot-S) tablet 1 tablet  1 tablet Oral QHS PRN Orson Eva, MD  vitamin B-12 (CYANOCOBALAMIN) tablet 500 mcg  500 mcg Oral Daily Tat, David, MD   500 mcg at 04/24/21 1017   zinc sulfate capsule 220 mg  220 mg Oral Daily Henri Medal, RPH   220 mg at 04/24/21  1016    Allergies as of 04/21/2021 - Review Complete 04/21/2021  Allergen Reaction Noted   Azithromycin Shortness Of Breath and Nausea And Vomiting 05/16/2018    Family History  Problem Relation Age of Onset   Hypertension Other     Social History   Socioeconomic History   Marital status: Single    Spouse name: Not on file   Number of children: Not on file   Years of education: Not on file   Highest education level: Not on file  Occupational History   Not on file  Tobacco Use   Smoking status: Never   Smokeless tobacco: Never  Vaping Use   Vaping Use: Never used  Substance and Sexual Activity   Alcohol use: Not Currently   Drug use: No   Sexual activity: Not on file  Other Topics Concern   Not on file  Social History Narrative   Not on file   Social Determinants of Health   Financial Resource Strain: Not on file  Food Insecurity: Not on file  Transportation Needs: Not on file  Physical Activity: Not on file  Stress: Not on file  Social Connections: Not on file  Intimate Partner Violence: Not on file    Review of Systems: Gen: Denies fever, chills, loss of appetite, change in weight or weight loss CV: Denies chest pain, heart palpitations, syncope, edema  Resp: Denies shortness of breath with rest, cough, wheezing GI: see HPI GU : Denies urinary burning, urinary frequency, urinary incontinence.  MS: see HPI Derm: Denies rash, itching, dry skin Psych: Denies depression, anxiety,confusion, or memory loss Heme: Denies bruising, bleeding, and enlarged lymph nodes.  Physical Exam: Vital signs in last 24 hours: Temp:  [98.2 F (36.8 C)-98.4 F (36.9 C)] 98.4 F (36.9 C) (01/30 1222) Pulse Rate:  [86-109] 101 (01/30 1222) Resp:  [18] 18 (01/30 1222) BP: (110-128)/(62-90) 111/78 (01/30 1222) SpO2:  [93 %-98 %] 95 % (01/30 1222) Last BM Date: 03/27/21 General:   Alert,  Well-developed, well-nourished, pleasant and cooperative in NAD Head:  Normocephalic  and atraumatic. Eyes:  Sclera clear, no icterus.   Conjunctiva pink. Ears:  Normal auditory acuity. Lungs:  Clear throughout to auscultation.    Heart:  S1 S2 present, no obvious murmurs Abdomen:  Soft, obese, nontender and nondistended. No masses, hepatosplenomegaly or hernias noted. Normal bowel sounds, without guarding, and without rebound.   Rectal:  Deferred  Msk:  able to move feet spontaneously but weakened lower extremities. Able to spontaneously move upper extremities.  Extremities:  Without edema. Neurologic:  Alert and  oriented x4 Psych:  Alert and cooperative. Normal mood and affect.  Intake/Output from previous day: 01/29 0701 - 01/30 0700 In: 480 [P.O.:480] Out: -  Intake/Output this shift: Total I/O In: 824 [P.O.:824] Out: 500 [Urine:500]  Lab Results: Recent Labs    04/22/21 0458 04/23/21 0556 04/24/21 0430  WBC 10.0 10.5 11.3*  HGB 10.2* 10.0* 9.4*  HCT 32.4* 31.6* 30.5*  PLT 349 367 325   BMET Recent Labs    04/22/21 0458 04/23/21 0556 04/24/21 0430  NA 139 140 138  K 3.5 3.3* 3.4*  CL 100 102 99  CO2 26 27 28   GLUCOSE 137* 121* 96  BUN  7 15 16   CREATININE <0.30* 0.33* 0.34*  CALCIUM 9.1 8.9 9.0   LFT Recent Labs    04/22/21 0458 04/22/21 1033 04/23/21 0556 04/24/21 0430  PROT 6.7  --  6.7 6.7  ALBUMIN 2.9*  --  3.0* 3.0*  AST 293*  --  463* 626*  ALT 128*  --  181* 252*  ALKPHOS 105  --  109 106  BILITOT 4.7* 4.6* 3.5* 3.8*  BILIDIR  --  2.6*  --   --   IBILI  --  2.0*  --   --    PT/INR Recent Labs    04/22/21 0458 04/24/21 0430  LABPROT 15.5* 14.7  INR 1.2 1.2   Hepatitis Panel Recent Labs    04/22/21 0458  HEPBSAG NON REACTIVE  HCVAB NON REACTIVE     Impression: 31  y.o. 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 She has been residing at [facility] since Nov [id number]. Hospitalized 1/17-1/23 due to Covid. Treated previously with 5 days of remdesivir.  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   Elevated transaminases: dating back to Sept 2022, new onset at that time, which coincides with acute illness. AST today 626, ALT 252, tbili 3.8. Fractionated bilirubin 04/22/21 with predominantly direct at 2.6. [place] with fatty liver. Hep C antibody negative. Hep B surface antigen negative. Denies any ETOH, herbal supplements. Denies any family history of liver disease or prior history of liver disease. Suspect abnormalities in setting of known GBS, Covid, superimposed on known fatty liver. CMV, EBV pending. Will order additional thorough serologies. Thankfully, INR is normal, and she has no signs of acute decompensation.    Plan: Follow HFP, INR daily Thorough serologies ordered Will continue to follow with you  Annitta Needs, PhD, ANP-BC [facility] G[facility]enter[facility]     LOS: 3 days    04/24/2021, 1:20 PM

## 2021-04-24 NOTE — Progress Notes (Signed)
PROGRESS NOTE  Carla Little [id number] DOB: [birth year] DOA: 04/21/2021 PCP: Patient, No Pcp Per (Inactive)  Brief History:   31 y.o. 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  Notably, patient had a hospitalization from 12/09/2020 to 01/26/2021 during which time she was treated for AIDP when she received 5 doses of IVIG.  She was subsequently discharged to SNF.  She stated that her strength gradually improved.  She continued to have dysesthesias in her arms and legs.  However, was readmitted to the hospital from 04/11/2021 to 04/17/2021 when she was treated for acute respiratory failure secondary to COVID-19 pneumonia.  During that admission, the patient received 5 days of remdesivir and steroids.  She was discharged home with prednisone which she finished on 04/16/2021.  On the next day, she began having the throat closing up sensation.  She denies any frank sore throat or dysphagia or odynophagia.  She denies any fevers, chills, chest pain, worsening shortness of breath, nausea, vomiting, diarrhea, abdominal pain.  She states that her extremity weakness has been improving up until the point where she contracted COVID-19 pneumonia.  Since then, she feels that her extremities have been weaker.  She states that her dysesthesias have been about the same since her COVID-19 pneumonia.  The patient states that she has been able to swallow pills and eat solid food without much difficulty.  There is no dysphagia or odynophagia.  She states that her face and neck have not been swollen.  She states that the Tylenol has not been swollen, but she began having numbness and tingling in her tongue on 04/20/2021. In the ED, patient was afebrile hemodynamically stable with oxygen saturation 92% on room air.  BMP showed a sodium 137, potassium 3.5,  CO2 28, serum creatinine 0.31.  AST 259, ALT 127, phosphatase 89, total bilirubin 5.3.  WBC 14.8, hemoglobin 10.9, platelets 3 98,000.  UA was negative for pyuria.  CT of the neck shows mildly prominent palatine tonsils with partial effaced oral pharyngeal airway without any abnormal fluid collection or abnormal enhancement.  The parapharyngeal spaces were clear, and tongue was normal.  There was normal salivary glands.  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, but plan for transfer if worsening neurologic or respiratory status. -1/29--pt states tongue still numb but throat closing sensation continues to improve -NIF neg 12; IS 1200 cc>>1445, NIF neg 40>>>continue each shift -04/24/21--discussed with Dr. Justice Deeds IVIG   COVID-19 pneumonia -Patient was previously treated with 5 days of remdesivir -Solu-Medrol 1000 mg daily x3 days--finished on 1/29 -Oxygen saturation was 92% on room air -CRP--13.4>>6.8>>3.8 -ferritin--115>>84>115 -PCT 0.13   Tranasminasemia -Suspect related to the patient's COVID-19 infection -Repeat right upper quadrant ultrasound -Fractionated bilirubin--mostly direct -Hepatitis B surface antigen--neg -Hepatitis C antibody--neg -Continue to trend -EBV DNA--pending -CMV DNA--pending -discontinue depakote -check haptoglobin--pending -consult to GI -tolerating diet without abd pain   Depression/anxiety -Continue Wellbutrin and BuSpar -disContinue Depakote due to elevated LFTs   Constipation -Continue Linzess   Morbid obesity -BMI 64.49 -Lifestyle modification   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  Patient denies fevers, chills, headache, chest pain, dyspnea, nausea, vomiting, diarrhea, abdominal pain, dysuria, hematuria, hematochezia, and melena. States lip and tongue numbness are better.  Objective: Vitals:   04/23/21 1932 04/24/21 0411 04/24/21 1014 04/24/21 1222  BP:  110/62 116/69 111/78  Pulse: 94 86 (!) 109 (!) 101  Resp: 18 18  18   Temp:  98.3 F (36.8 C)  98.4 F (36.9 C)  TempSrc:  Oral  Oral  SpO2: 94% 93%  95%  Weight:      Height:        Intake/Output Summary (Last 24 hours) at 04/24/2021 1736 Last data filed at 04/24/2021 1320 Gross per 24 hour  Intake 1064 ml  Output 500 ml  Net 564 ml   Weight change:  Exam:  General:  Pt is alert, follows commands appropriately, not in acute distress HEENT: No icterus, No thrush, No neck mass, [place]/AT Cardiovascular: RRR, S1/S2, no rubs, no gallops Respiratory: CTA bilaterally, no wheezing, no crackles, no rhonchi Abdomen: Soft/+BS, non tender, non distended, no guarding Extremities: Non pitting edema, No lymphangitis, No petechiae, No rashes, no synovitis   Data Reviewed: I have personally reviewed following labs and imaging studies Basic Metabolic Panel: Recent Labs  Lab 04/21/21 1002 04/22/21 0458 04/23/21 0556 04/24/21 0430  NA 137 139 140 138  K 3.5 3.5 3.3* 3.4*  CL 96* 100 102 99  CO2 28 26 27 28   GLUCOSE 78 137* 121* 96  BUN 6 7 15 16   CREATININE 0.31* <0.30* 0.33* 0.34*  CALCIUM 9.0 9.1 8.9 9.0  MG  --  1.8  --   --    Liver Function Tests: Recent Labs  Lab 04/21/21 1002 04/22/21 0458 04/22/21 1033 04/23/21 0556 04/24/21 0430  AST  259* 293*  --  463* 626*  ALT 127* 128*  --  181* 252*  ALKPHOS 89 105  --  109 106  BILITOT 5.3* 4.7* 4.6* 3.5* 3.8*  PROT 7.0 6.7  --  6.7 6.7  ALBUMIN 3.0* 2.9*  --  3.0* 3.0*   No results for input(s): LIPASE, AMYLASE in the last 168 hours. No results for input(s): AMMONIA in the last 168 hours. Coagulation Profile: Recent Labs  Lab 04/22/21 0458 04/24/21 0430  INR 1.2 1.2   CBC: Recent Labs  Lab 04/21/21 1002 04/22/21 0458 04/23/21 0556 04/24/21 0430  WBC 14.8* 10.0 10.5 11.3*  NEUTROABS 10.8*  --   --   --   HGB 10.9* 10.2* 10.0* 9.4*  HCT 35.0* 32.4* 31.6* 30.5*  MCV 85.0 85.9 85.6 87.1  PLT 398 349 367 325   Cardiac Enzymes: No results for input(s): CKTOTAL, CKMB, CKMBINDEX, TROPONINI in the last 168 hours. BNP: Invalid input(s): POCBNP CBG: Recent Labs  Lab 04/23/21 1630 04/23/21 2008 04/24/21 0745 04/24/21 1123 04/24/21 1633  GLUCAP 134* 152* 111* 107* 90   HbA1C: No results for input(s): HGBA1C in the last 72 hours. Urine analysis:    Component Value Date/Time   COLORURINE AMBER (A) 04/15/2021 [id number]  APPEARANCEUR HAZY (A) 04/15/2021 0624   LABSPEC 1.018 04/15/2021 0624   PHURINE 7.0 04/15/2021 0624   GLUCOSEU NEGATIVE 04/15/2021 0624   HGBUR MODERATE (A) 04/15/2021 0624   BILIRUBINUR NEGATIVE 04/15/2021 0624   KETONESUR NEGATIVE 04/15/2021 0624   PROTEINUR 30 (A) 04/15/2021 0624   UROBILINOGEN 1.0 10/27/2013 2116   NITRITE POSITIVE (A) 04/15/2021 0624   LEUKOCYTESUR MODERATE (A) 04/15/2021 0624   Sepsis Labs: @LABRCNTIP (procalcitonin:4,lacticidven:4) ) Recent Results (from the past 240 hour(s))  Urine Culture     Status: Abnormal   Collection Time: 04/15/21  6:24 AM   Specimen: Urine, Clean Catch  Result Value Ref Range Status   Specimen Description   Final    URINE, CLEAN CATCH Performed at [facility], [address], [place], [place] [address]    Special Requests   Final    NONE Performed at [facility], [address],  [place], [place] [address]    Culture (A)  Final    >=100,000 COLONIES/mL MULTIPLE SPECIES PRESENT, SUGGEST RECOLLECTION   Report Status 04/18/2021 FINAL  Final  Resp Panel by RT-PCR (Flu A&B, Covid) Nasopharyngeal Swab     Status: Abnormal   Collection Time: 04/21/21  2:13 PM   Specimen: Nasopharyngeal Swab; Nasopharyngeal(NP) swabs in vial transport medium  Result Value Ref Range Status   SARS Coronavirus 2 by RT PCR POSITIVE (A) NEGATIVE Final    Comment: (NOTE) SARS-CoV-2 target nucleic acids are DETECTED.  The SARS-CoV-2 RNA is generally detectable in upper respiratory specimens during the acute phase of infection. Positive results are indicative of the presence of the identified virus, but do not rule out bacterial infection or co-infection with other pathogens not detected by the test. Clinical correlation with patient history and other diagnostic information is necessary to determine patient infection status. The expected result is Negative.  Fact Sheet for Patients: EntrepreneurPulse.com.au  Fact Sheet for Healthcare Providers: IncredibleEmployment.be  This test is not yet approved or cleared by the [place] FDA and  has been authorized for detection and/or diagnosis of SARS-CoV-2 by FDA under an Emergency Use Authorization (EUA).  This EUA will remain in effect (meaning this test can be used) for the duration of  the COVID-19 declaration under Section 564(b)(1) of the A ct, 21 U.S.C. section 360bbb-3(b)(1), unless the authorization is terminated or revoked sooner.     Influenza A by PCR NEGATIVE NEGATIVE Final   Influenza B by PCR NEGATIVE NEGATIVE Final    Comment: (NOTE) The Xpert Xpress SARS-CoV-2/FLU/RSV plus assay is intended as an aid in the diagnosis of influenza from Nasopharyngeal swab specimens and should not be used as a sole basis for treatment. Nasal washings and aspirates are unacceptable for Xpert Xpress  SARS-CoV-2/FLU/RSV testing.  Fact Sheet for Patients: EntrepreneurPulse.com.au  Fact Sheet for Healthcare Providers: IncredibleEmployment.be  This test is not yet approved or cleared by the [place] FDA and has been authorized for detection and/or diagnosis of SARS-CoV-2 by FDA under an Emergency Use Authorization (EUA). This EUA will remain in effect (meaning this test can be used) for the duration of the COVID-19 declaration under Section 564(b)(1) of the Act, 21 U.S.C. section 360bbb-3(b)(1), unless the authorization is terminated or revoked.  Performed at [facility], [address], [place], [place] [address]      Scheduled Meds:  ascorbic acid  500 mg Oral Daily   buPROPion  150 mg Oral Daily   busPIRone  5 mg Oral BID   cholecalciferol  1,000 Units Oral Daily  enoxaparin (LOVENOX) injection  85 mg Subcutaneous [id number]   folic acid  1 mg Oral Daily   linaclotide  290 mcg Oral QAC breakfast   metoprolol tartrate  12.5 mg Oral BID   pantoprazole  40 mg Oral BID   polyethylene glycol  17 g Oral BID   [START ON 04/25/2021] predniSONE  50 mg Oral Q breakfast   Followed by   Derrill Memo ON 04/26/2021] predniSONE  40 mg Oral Q breakfast   Followed by   Derrill Memo ON 04/27/2021] predniSONE  30 mg Oral Q breakfast   Followed by   Derrill Memo ON 04/28/2021] predniSONE  20 mg Oral Q breakfast   Followed by   Derrill Memo ON 04/29/2021] predniSONE  10 mg Oral Q breakfast   pregabalin  75 mg Oral TID   vitamin B-12  500 mcg Oral Daily   zinc sulfate  220 mg Oral Daily   Continuous Infusions:  sodium chloride 10 mL/hr at 04/22/21 1019   Immune Globulin 10%      Procedures/Studies: CT Soft Tissue Neck W Contrast  Result Date: 04/21/2021 CLINICAL DATA:  Possible throat swelling starting 3 days ago, difficulty swelling EXAM: CT NECK WITH CONTRAST TECHNIQUE: Multidetector CT imaging of the neck was performed using the standard protocol following the bolus  administration of intravenous contrast. RADIATION DOSE REDUCTION: This exam was performed according to the departmental dose-optimization program which includes automated exposure control, adjustment of the mA and/or kV according to patient size and/or use of iterative reconstruction technique. CONTRAST:  34mL OMNIPAQUE IOHEXOL 300 MG/ML  SOLN COMPARISON:  None. FINDINGS: Pharynx and larynx: The nasal cavity and nasopharynx unremarkable. The bilateral palatine tonsils are mildly prominent and partially efface the oropharyngeal airway the without abnormal enhancement or fluid collection. The oral cavity and oropharynx are otherwise unremarkable. The parapharyngeal spaces are clear. The tongue is normal in appearance. The hypopharynx and larynx are unremarkable. The vocal folds are normal in appearance. Salivary glands: The parotid glands are unremarkable. There is fatty atrophy of the submandibular glands. Thyroid: Unremarkable. Lymph nodes: There is a 9 mm left level II lymph node and 7 mm right level II node, nonspecific and not pathologically enlarged by size criteria. There is no pathologic lymphadenopathy in the neck. Vascular: Unremarkable. Limited intracranial: Imaged portions of the intracranial compartment are unremarkable. Visualized orbits: Globes and orbits are unremarkable. Mastoids and visualized paranasal sinuses: The imaged paranasal sinuses are clear. The mastoid air cells are clear. Skeleton: There is no acute osseous abnormality or aggressive osseous lesion. Upper chest: Are patchy opacities in the lung apices, overall slightly improved compared to the CTA chest from 04/12/2021. Other: None. IMPRESSION: 1. Prominent bilateral palatine tonsils which partially efface the oropharyngeal airway but without abnormal enhancement or mass lesion. No abnormal fluid collection. 2. Subcentimeter bilateral level II lymph nodes, nonspecific and may be reactive. No pathologic lymphadenopathy in the neck. 3.  Patchy opacities in the lung apices likely reflecting infection, improved compared to the CTA chest from 04/12/2021. Electronically Signed   By: Valetta Mole M.D.   On: 04/21/2021 12:04   CT Angio Chest Pulmonary Embolism (PE) W or WO Contrast  Result Date: 04/12/2021 CLINICAL DATA:  Concern for pulmonary embolism. EXAM: CT ANGIOGRAPHY CHEST WITH CONTRAST TECHNIQUE: Multidetector CT imaging of the chest was performed using the standard protocol during bolus administration of intravenous contrast. Multiplanar CT image reconstructions and MIPs were obtained to evaluate the vascular anatomy. RADIATION DOSE REDUCTION: This exam was performed according to the departmental dose-optimization  program which includes automated exposure control, adjustment of the mA and/or kV according to patient size and/or use of iterative reconstruction technique. CONTRAST:  OMNIPAQUE IOHEXOL 350 MG/ML SOLN COMPARISON:  CT dated 11/03/2020 and chest radiograph dated 04/12/2021. FINDINGS: Evaluation of this exam is limited due to respiratory motion artifact. Cardiovascular: Top-normal cardiac size. No pericardial effusion. The thoracic aorta is unremarkable. Evaluation of the pulmonary arteries is very limited due to severe respiratory motion artifact. No large or central pulmonary artery embolus identified. Mediastinum/Nodes: No hilar or mediastinal adenopathy. The esophagus is grossly unremarkable. No mediastinal fluid collection. Lungs/Pleura: Bilateral patchy airspace opacities most concerning for multilobar pneumonia. No pleural effusion pneumothorax. The central airways are patent. Upper Abdomen: Severe fatty liver. Musculoskeletal: No chest wall abnormality. No acute or significant osseous findings. Review of the MIP images confirms the above findings. IMPRESSION: 1. No CT evidence of central pulmonary artery embolus. 2. Multifocal pneumonia. Clinical correlation and follow-up to resolution recommended. 3. Severe fatty  liver. Electronically Signed   By: Elgie Collard M.D.   On: 04/12/2021 02:21   MR BRAIN W WO CONTRAST  Result Date: 04/21/2021 CLINICAL DATA:  Numbness or tingling, paresthesia (Ped 0-17y) tongue paresthesias, lump in throat after covid EXAM: MRI HEAD WITHOUT AND WITH CONTRAST TECHNIQUE: Multiplanar, multiecho pulse sequences of the brain and surrounding structures were obtained without and with intravenous contrast. CONTRAST:  42mL GADAVIST GADOBUTROL 1 MMOL/ML IV SOLN COMPARISON:  12/20/2020 FINDINGS: Brain: There is no acute infarction or intracranial hemorrhage. There is no intracranial mass, mass effect, or edema. There is no hydrocephalus or extra-axial fluid collection. Ventricles and sulci are normal in size and configuration. No abnormal enhancement. Vascular: Major vessel flow voids at the skull base are preserved. Skull and upper cervical spine: Normal marrow signal is preserved. Sinuses/Orbits: Paranasal sinuses are aerated. Orbits are unremarkable. Other: Sella is unremarkable. Mastoid air cells are clear. Prominence of the palatine tonsils. IMPRESSION: No evidence of recent infarction, hemorrhage, or mass. No abnormal enhancement. Prominence of the palatine tonsils. Electronically Signed   By: Guadlupe Spanish M.D.   On: 04/21/2021 17:09   MR CERVICAL SPINE W WO CONTRAST  Result Date: 04/24/2021 EXAM: MRI CERVICAL SPINE WITHOUT AND WITH CONTRAST TECHNIQUE: Multiplanar and multiecho pulse sequences of the cervical spine, to include the craniocervical junction and cervicothoracic junction, were obtained without and with intravenous contrast. CONTRAST:  29mL GADAVIST GADOBUTROL 1 MMOL/ML IV SOLN COMPARISON:  None. FINDINGS: Alignment: Physiologic. Vertebrae: No fracture, evidence of discitis, or bone lesion. Cord: Normal signal and morphology.  No abnormal enhancement. Posterior Fossa, vertebral arteries, paraspinal tissues: Negative. Disc levels: C2-3: No significant disc bulge. No neural  foraminal stenosis. No central canal stenosis. C3-4: No significant disc bulge. No neural foraminal stenosis. No central canal stenosis. C4-5: No significant disc bulge. No neural foraminal stenosis. No central canal stenosis. C5-6: No significant disc bulge. No neural foraminal stenosis. No central canal stenosis. C6-7: No significant disc bulge. No neural foraminal stenosis. No central canal stenosis. C7-T1: No significant disc bulge. No neural foraminal stenosis. No central canal stenosis. IMPRESSION: Normal examination. Electronically Signed   By: Larose Hires D.O.   On: 04/24/2021 17:06   DG Chest Port 1 View  Result Date: 04/12/2021 CLINICAL DATA:  Shortness of breath. EXAM: PORTABLE CHEST 1 VIEW COMPARISON:  Chest radiograph dated 01/02/2021. FINDINGS: Shallow inspiration. There is mild cardiomegaly with mild vascular congestion. Left upper lobe streaky densities may represent vascular congestion. Developing infiltrate is not excluded. Clinical correlation is  recommended. No focal consolidation, pleural effusion, or pneumothorax. No acute osseous pathology. IMPRESSION: 1. Mild cardiomegaly with mild vascular congestion. 2. Left upper lobe streaky densities may represent vascular congestion versus developing infiltrate. Electronically Signed   By: Anner Crete M.D.   On: 04/12/2021 00:38   US Abdomen Limited RUQ (LIVER/GB)  Result Date: 04/22/2021 CLINICAL DATA:  Abnormal liver function tests EXAM: ULTRASOUND ABDOMEN LIMITED RIGHT UPPER QUADRANT COMPARISON:  01/03/2021 FINDINGS: Gallbladder: There are hyperechoic foci in the dependent portion of gallbladder suggesting gallbladder stones. There is no definite demonstrable intraluminal mobility in the submitted images. There is no wall thickening. Technologist did not observe any tenderness over the gallbladder. Common bile duct: Diameter: 3 mm Liver: There is increased echogenicity suggesting fatty infiltration. No focal abnormality is seen in the  visualized portions of liver. Portal vein is patent on color Doppler imaging with normal direction of blood flow towards the liver. Other: None. IMPRESSION: Linear hyperechoic focus seen in the posterior margin of gallbladder may suggest layering of small stones or calcification in the gallbladder wall. There are no signs of acute cholecystitis. Fatty liver. Electronically Signed   By: Elmer Picker M.D.   On: 04/22/2021 12:12    Orson Eva, DO  [facility]  If 7PM-7AM, please contact night-coverage [email or web address] Password [email or web address] 04/24/2021, 5:36 PM   LOS: 3 days

## 2021-04-24 NOTE — [place] FL2 (Signed)
[place] MEDICAID FL2 LEVEL OF CARE SCREENING TOOL     IDENTIFICATION  Patient Name: Carla Little Birthdate: [birth year] Sex: female Admission Date (Current Location): 04/21/2021  [facility] and [place] Number:  [employer] and Address:  [place] [address], [place]      Provider Number: [id number]  Attending Physician Name and Address:  Orson Eva, MD  Relative Name and Phone Number:       Current Level of Care: Hospital Recommended Level of Care: [place] Prior Approval Number:    Date Approved/Denied:   PASRR Number:    Discharge Plan: SNF    Current Diagnoses: Patient Active Problem List   Diagnosis Date Noted   Transaminasemia    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 coli UTI    Intractable vomiting with nausea 12/09/2020   PUD (peptic ulcer disease) [id number]   Nonalcoholic steatohepatitis (NASH) 12/09/2020   Class 3 obesity ([place]) 12/09/2020   Sinus tachycardia 12/09/2020   Hypokalemia 12/09/2020   GERD (gastroesophageal reflux disease)    Depression    Iron deficiency anemia due to chronic blood loss    Pressure injury of skin 11/21/2020   Left knee dislocation 11/01/2020   Knee dislocation, left, initial encounter 11/01/2020    Orientation RESPIRATION BLADDER Height & Weight     Self, Time, Situation, Place  Normal External catheter Weight: (!) 375 lb 11.2 oz (170.4 kg) Height:  5\' 4"  (162.6 cm)  BEHAVIORAL SYMPTOMS/MOOD NEUROLOGICAL BOWEL NUTRITION STATUS      Incontinent Diet (see dc summary)  AMBULATORY STATUS COMMUNICATION OF NEEDS Skin   Extensive Assist Verbally PU Stage and Appropriate Care (R and L buttocks)   PU Stage 2  Dressing: Daily                   Personal Care Assistance Level of Assistance    Bathing Assistance: Maximum assistance Feeding assistance: Limited assistance Dressing Assistance: Maximum assistance     Functional Limitations Info    Sight Info: Adequate Hearing Info: Adequate Speech Info: Adequate    SPECIAL CARE FACTORS FREQUENCY  PT (By licensed PT)     PT Frequency: 5x week              Contractures Contractures Info: Not present    Additional Factors Info    Code Status Info: Full Allergies Info: Azithromycin           Current Medications (04/24/2021):  This is the current hospital active medication list Current Facility-Administered Medications  Medication Dose Route Frequency Provider Last Rate Last Admin   0.9 %  sodium chloride infusion   Intravenous PRN Tat, Shanon Brow, MD 10 mL/hr at 04/22/21 1019 New Bag at 04/22/21 1019   acetaminophen (TYLENOL) tablet 650 mg  650 mg Oral Q6H PRN Tat, Shanon Brow, MD   650 mg at 04/22/21 1005   Or   acetaminophen (TYLENOL) suppository 650 mg  650 mg Rectal Q6H PRN Tat, Shanon Brow, MD       ascorbic acid (VITAMIN C) tablet 500 mg  500 mg Oral Daily Tat, David, MD   500 mg at 04/24/21 1016   buPROPion (WELLBUTRIN SR) 12 hr tablet 150 mg  150 mg Oral Daily  Orson Eva, MD   150 mg at 04/24/21 1016   busPIRone (BUSPAR) tablet 5 mg  5 mg Oral BID Tat, Shanon Brow, MD   5 mg at 04/24/21 1016   cholecalciferol (VITAMIN D3) tablet 1,000 Units  1,000 Units Oral Daily Tat, David, MD   1,000 Units at 04/24/21 1016   enoxaparin (LOVENOX) injection 85 mg  85 mg Subcutaneous Q24H Orson Eva, MD   85 mg at [id number]   folic acid (FOLVITE) tablet 1 mg  1 mg Oral Daily Tat, David, MD   1 mg at 04/24/21 1016   hydrOXYzine (ATARAX) tablet 25 mg  25 mg Oral TID PRN Orson Eva, MD   25 mg at 04/23/21 2354   linaclotide (LINZESS) capsule 290 mcg  290 mcg Oral QAC breakfast Tat, Shanon Brow, MD   290 mcg at 04/24/21 1016   melatonin tablet 9 mg  9 mg Oral QHS  PRN Adefeso, Oladapo, DO   9 mg at 04/23/21 2354   methocarbamol (ROBAXIN) tablet 500 mg  500 mg Oral Q6H PRN Tat, Shanon Brow, MD       metoprolol tartrate (LOPRESSOR) tablet 12.5 mg  12.5 mg Oral BID Tat, Shanon Brow, MD   12.5 mg at 04/24/21 1017   ondansetron (ZOFRAN) tablet 4 mg  4 mg Oral Q6H PRN Tat, Shanon Brow, MD       Or   ondansetron (ZOFRAN) injection 4 mg  4 mg Intravenous Q6H PRN Tat, Shanon Brow, MD       pantoprazole (PROTONIX) EC tablet 40 mg  40 mg Oral BID Tat, David, MD   40 mg at 04/24/21 1016   polyethylene glycol (MIRALAX / GLYCOLAX) packet 17 g  17 g Oral BID Tat, David, MD       pregabalin (LYRICA) capsule 75 mg  75 mg Oral TID Orson Eva, MD   75 mg at 04/24/21 1016   senna-docusate (Senokot-S) tablet 1 tablet  1 tablet Oral QHS PRN Tat, Shanon Brow, MD       vitamin B-12 (CYANOCOBALAMIN) tablet 500 mcg  500 mcg Oral Daily Tat, David, MD   500 mcg at 04/24/21 1017   zinc sulfate capsule 220 mg  220 mg Oral Daily Henri Medal, RPH   220 mg at 04/24/21 1016     Discharge Medications: Please see discharge summary for a list of discharge medications.  Relevant Imaging Results:  Relevant Lab Results:   Additional Information    Shade Flood, LCSW

## 2021-04-24 NOTE — TOC Initial Note (Signed)
Transition of Care [facility]) - Initial/Assessment Note    Patient Details  Name: Carla Little MRN: [id number] Date of Birth: 1990-03-30  [facility] CM/SW Contact:    Shade Flood, LCSW Phone Number: 04/24/2021, 11:55 AM  Clinical Narrative:                  Pt admitted from [facility] where she resides with Medicaid pending status. Spoke with Debbie at [facility] to update. Per Jackelyn Poling, pt can return to the facility at dc. MD anticipating dc Wednesday at the earliest. [facility] will follow.  Expected Discharge Plan: Skilled Nursing Facility Barriers to Discharge: Continued Medical Work up   Patient Goals and CMS Choice Patient states their goals for this hospitalization and ongoing recovery are:: from SNF      Expected Discharge Plan and Services Expected Discharge Plan: [place] In-house Referral: Clinical Social Work     Living arrangements for the past 2 months: [place]                                      Prior Living Arrangements/Services Living arrangements for the past 2 months: [place] Lives with:: Facility Resident Patient language and need for interpreter reviewed:: Yes Do you feel safe going back to the place where you live?: Yes      Need for Family Participation in Patient Care: No (Comment) Care giver support system in place?: Yes (comment)   Criminal Activity/Legal Involvement Pertinent to Current Situation/Hospitalization: No - Comment as needed  Activities of Daily Living      Permission Sought/Granted                  Emotional Assessment       Orientation: : Oriented to Self, Oriented to Place, Oriented to Situation, Oriented to  Time Alcohol / Substance Use: Not Applicable Psych Involvement: No (comment)  Admission diagnosis:  AIDP (acute inflammatory demyelinating polyneuropathy) ([place]) [G61.0] Angioedema, initial encounter [T78.3XXA] Patient Active Problem  List   Diagnosis Date Noted   Transaminasemia    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 coli UTI    Intractable vomiting with nausea 12/09/2020   PUD (peptic ulcer disease) A999333   Nonalcoholic steatohepatitis ([place]) 12/09/2020   Class 3 obesity ([place]) 12/09/2020   Sinus tachycardia 12/09/2020   Hypokalemia 12/09/2020   GERD (gastroesophageal reflux disease)    Depression    Iron deficiency anemia due to chronic blood loss    Pressure injury of skin 11/21/2020   Left knee dislocation 11/01/2020   Knee dislocation, left, initial encounter 11/01/2020   PCP:  Patient, No Pcp Per (Inactive) Pharmacy:   [id number] - [place], [place] - [address] [place] [address] Phone: [phone] Fax: [phone]     Social Determinants of Health (SDOH) Interventions    Readmission Risk Interventions Readmission Risk Prevention Plan 04/17/2021  Transportation Screening Complete  PCP or Specialist Appt within 3-5 Days Complete  HRI or [place] Complete  Social Work Consult for [place] Planning/Counseling Complete  Palliative Care Screening Not Applicable  Medication Review [employer]) Complete  Some recent data might be hidden

## 2021-04-25 LAB — COMPREHENSIVE METABOLIC PANEL
ALT: 218 U/L — ABNORMAL HIGH (ref 0–44)
AST: 434 U/L — ABNORMAL HIGH (ref 15–41)
Albumin: 2.6 g/dL — ABNORMAL LOW (ref 3.5–5.0)
Alkaline Phosphatase: 83 U/L (ref 38–126)
Anion gap: 9 (ref 5–15)
BUN: 13 mg/dL (ref 6–20)
CO2: 27 mmol/L (ref 22–32)
Calcium: 8.4 mg/dL — ABNORMAL LOW (ref 8.9–10.3)
Chloride: 99 mmol/L (ref 98–111)
Creatinine, Ser: <0.3 mg/dL — ABNORMAL LOW (ref 0.44–1.00)
Glucose, Bld: 76 mg/dL (ref 70–99)
Potassium: 3.5 mmol/L (ref 3.5–5.1)
Sodium: 135 mmol/L (ref 135–145)
Total Bilirubin: 4.6 mg/dL — ABNORMAL HIGH (ref 0.3–1.2)
Total Protein: 6.4 g/dL — ABNORMAL LOW (ref 6.5–8.1)

## 2021-04-25 LAB — CBC
HCT: 29.8 % — ABNORMAL LOW (ref 36.0–46.0)
Hemoglobin: 8.7 g/dL — ABNORMAL LOW (ref 12.0–15.0)
MCH: 27 pg (ref 26.0–34.0)
MCHC: 29.2 g/dL — ABNORMAL LOW (ref 30.0–36.0)
MCV: 92.5 fL (ref 80.0–100.0)
Platelets: 263 10*3/uL (ref 150–400)
RBC: 3.22 MIL/uL — ABNORMAL LOW (ref 3.87–5.11)
RDW: 23.8 % — ABNORMAL HIGH (ref 11.5–15.5)
WBC: 12.3 10*3/uL — ABNORMAL HIGH (ref 4.0–10.5)
nRBC: 0 % (ref 0.0–0.2)

## 2021-04-25 LAB — GLUCOSE, CAPILLARY
Glucose-Capillary: 103 mg/dL — ABNORMAL HIGH (ref 70–99)
Glucose-Capillary: 94 mg/dL (ref 70–99)
Glucose-Capillary: 132 mg/dL — ABNORMAL HIGH (ref 70–99)
Glucose-Capillary: 143 mg/dL — ABNORMAL HIGH (ref 70–99)

## 2021-04-25 LAB — PROTIME-INR
INR: 1.2 (ref 0.8–1.2)
Prothrombin Time: 15.1 seconds (ref 11.4–15.2)

## 2021-04-25 LAB — C-REACTIVE PROTEIN: CRP: 4.1 mg/dL — ABNORMAL HIGH (ref <1.0)

## 2021-04-25 LAB — CMV DNA, QUANTITATIVE, PCR
CMV DNA Quant: NEGATIVE IU/mL
Log10 CMV Qn DNA Pl: UNDETERMINED log10 IU/mL

## 2021-04-25 LAB — FERRITIN: Ferritin: 156 ng/mL (ref 11–307)

## 2021-04-25 MED ORDER — NYSTATIN 100000 UNIT/ML MT SUSP
5.0000 mL | Freq: Four times a day (QID) | OROMUCOSAL | Status: DC
  Administered 2021-04-25 – 2021-04-28 (×15): 500000 [IU] via ORAL
  Filled 2021-04-25 (×15): qty 5

## 2021-04-25 NOTE — Progress Notes (Signed)
Patient c/o pain and discomfort to inside of her mouth noted white like peeling skin inside of her mouth. MD notified. Ordered nystatin suspension to start 04/25/21.

## 2021-04-25 NOTE — Progress Notes (Signed)
PROGRESS NOTE  Carla Little [id number] DOB: 1990-08-06 DOA: 04/21/2021 PCP: Patient, No Pcp Per (Inactive)    Brief History:   31 y.o. 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  Notably, patient had a hospitalization from 12/09/2020 to 01/26/2021 during which time she was treated for AIDP when she received 5 doses of IVIG.  She was subsequently discharged to SNF.  She stated that her strength gradually improved.  She continued to have dysesthesias in her arms and legs.  However, was readmitted to the hospital from 04/11/2021 to 04/17/2021 when she was treated for acute respiratory failure secondary to COVID-19 pneumonia.  During that admission, the patient received 5 days of remdesivir and steroids.  She was discharged home with prednisone which she finished on 04/16/2021.  On the next day, she began having the throat closing up sensation.  She denies any frank sore throat or dysphagia or odynophagia.  She denies any fevers, chills, chest pain, worsening shortness of breath, nausea, vomiting, diarrhea, abdominal pain.  She states that her extremity weakness has been improving up until the point where she contracted COVID-19 pneumonia.  Since then, she feels that her extremities have been weaker.  She states that her dysesthesias have been about the same since her COVID-19 pneumonia.  The patient states that she has been able to swallow pills and eat solid food without much difficulty.  There is no dysphagia or odynophagia.  She states that her face and neck have not been swollen.  She states that the Tylenol has not been swollen, but she began having numbness and tingling in her tongue on 04/20/2021. In the ED, patient was afebrile hemodynamically stable with oxygen saturation 92% on room air.  BMP showed a sodium 137, potassium  3.5, CO2 28, serum creatinine 0.31.  AST 259, ALT 127, phosphatase 89, total bilirubin 5.3.  WBC 14.8, hemoglobin 10.9, platelets 3 98,000.  UA was negative for pyuria.  CT of the neck shows mildly prominent palatine tonsils with partial effaced oral pharyngeal airway without any abnormal fluid collection or abnormal enhancement.  The parapharyngeal spaces were clear, and tongue was normal.  There was normal salivary glands.  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 -1/31--pt states tongue still numb but throat closing sensation continues to improve -NIF neg 12; IS 1200 cc>>1445, NIF neg 40>>>continue each shift -04/24/21--discussed with Dr. Justice Deeds IVIG  D#2/5 today 1/31   COVID-19 pneumonia -Patient was previously treated with 5 days of remdesivir -Solu-Medrol 1000 mg daily x3 days--finished on 1/29 -Oxygen saturation was 92% on room air -CRP--13.4>>6.8>>3.8>>4.1 -ferritin--115>>84>115>>156 -PCT 0.13   Tranasminasemia -Suspect related to the patient's COVID-19 infection vs autoimmune -Repeat right upper quadrant ultrasound -Fractionated bilirubin--mostly direct -Hepatitis B surface antigen--neg -Hepatitis C antibody--neg -Continue to trend -EBV DNA--pending -CMV DNA--neg -discontinue depakote -check haptoglobin--167 (normal) -consult to GI appreciated>>serologic work up for autoimmune hepatitis -tolerating diet without abd pain -04/25/21--first day trending down after 1 dose IVIG   Depression/anxiety -Continue Wellbutrin and BuSpar -disContinue Depakote due to  elevated LFTs   Constipation -Continue  Linzess   Morbid obesity -BMI 64.49 -Lifestyle modification   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  Throat closing sensation improving.  No n/v/d, no abd pain, no cp, no sob  Objective: Vitals:   04/24/21 2006 04/24/21 2144 04/25/21 0503 04/25/21 1318  BP: 108/69 103/73 111/65 116/81  Pulse: (!) 107 (!) 105 99 90  Resp: 16 17 16 18   Temp: 98.8 F (37.1 C) 98.2 F (36.8 C) 98.2 F (36.8 C)   TempSrc: Oral Oral    SpO2: 99% 97% 94% 98%  Weight:      Height:        Intake/Output Summary (Last 24 hours) at 04/25/2021 1454 Last data filed at 04/25/2021 1334 Gross per 24 hour  Intake 1406.47 ml  Output 200 ml  Net 1206.47 ml   Weight change:  Exam:  General:  Pt is alert, follows commands appropriately, not in acute distress HEENT: No icterus, No thrush, No neck mass, [place]/AT Cardiovascular: RRR, S1/S2, no rubs, no gallops Respiratory: bibasilar rales.  No wheeze Abdomen: Soft/+BS, non tender, non distended, no guarding Extremities: Non pitting edema, No lymphangitis, No petechiae, No rashes, no synovitis   Data Reviewed: I have personally reviewed following labs and imaging studies Basic Metabolic Panel: Recent Labs  Lab 04/21/21 1002 04/22/21 0458 04/23/21 0556 04/24/21 0430 04/25/21 0533  NA 137 139 140 138 135  K 3.5 3.5 3.3* 3.4* 3.5  CL 96* 100 102 99 99  CO2 28 26 27 28 27   GLUCOSE 78 137* 121* 96 76  BUN 6 7 15 16 13   CREATININE 0.31* <0.30* 0.33* 0.34* <0.30*  CALCIUM 9.0 9.1 8.9 9.0 8.4*  MG  --  1.8  --   --   --     Liver Function Tests: Recent Labs  Lab 04/21/21 1002 04/22/21 0458 04/22/21 1033 04/23/21 0556 04/24/21 0430 04/25/21 0533  AST 259* 293*  --  463* 626* 434*  ALT 127* 128*  --  181* 252* 218*  ALKPHOS 89 105  --  109 106 83  BILITOT 5.3* 4.7* 4.6* 3.5* 3.8* 4.6*  PROT 7.0 6.7  --  6.7 6.7 6.4*  ALBUMIN 3.0* 2.9*  --  3.0* 3.0* 2.6*   No results for input(s): LIPASE, AMYLASE in the last 168 hours. No results for input(s): AMMONIA in the last 168 hours. Coagulation Profile: Recent Labs  Lab 04/22/21 0458 04/24/21 0430 04/25/21 0533  INR 1.2 1.2 1.2   CBC: Recent Labs  Lab 04/21/21 1002 04/22/21 0458 04/23/21 0556 04/24/21 0430 04/25/21 0533  WBC 14.8* 10.0 10.5 11.3* 12.3*  NEUTROABS 10.8*  --   --   --   --   HGB 10.9* 10.2* 10.0* 9.4* 8.7*  HCT 35.0* 32.4* 31.6* 30.5* 29.8*  MCV 85.0 85.9 85.6 87.1 92.5  PLT 398 349 367 325 263   Cardiac Enzymes: No results for input(s): CKTOTAL, CKMB, CKMBINDEX, TROPONINI in the  last 168 hours. BNP: Invalid input(s): POCBNP CBG: Recent Labs  Lab 04/24/21 1123 04/24/21 1633 04/24/21 2140 04/25/21 0758 04/25/21 1128  GLUCAP 107* 90 110* 103* 94   HbA1C: No results for input(s): HGBA1C in the last 72 hours. Urine analysis:    Component Value Date/Time   COLORURINE AMBER (A) 04/15/2021 0624   APPEARANCEUR HAZY (A) 04/15/2021 0624   LABSPEC 1.018 04/15/2021 0624   PHURINE 7.0 04/15/2021 0624   GLUCOSEU NEGATIVE 04/15/2021 0624   HGBUR MODERATE (A) 04/15/2021 0624   BILIRUBINUR NEGATIVE 04/15/2021 0624   KETONESUR NEGATIVE 04/15/2021 0624   PROTEINUR 30 (A) 04/15/2021 0624   UROBILINOGEN 1.0 10/27/2013 2116   NITRITE POSITIVE (A) 04/15/2021 0624   LEUKOCYTESUR MODERATE (A) 04/15/2021 0624   Sepsis Labs: @LABRCNTIP (procalcitonin:4,lacticidven:4) ) Recent Results (from the past 240 hour(s))  Resp Panel by RT-PCR (Flu A&B, Covid) Nasopharyngeal Swab     Status: Abnormal   Collection Time: 04/21/21  2:13 PM    Specimen: Nasopharyngeal Swab; Nasopharyngeal(NP) swabs in vial transport medium  Result Value Ref Range Status   SARS Coronavirus 2 by RT PCR POSITIVE (A) NEGATIVE Final    Comment: (NOTE) SARS-CoV-2 target nucleic acids are DETECTED.  The SARS-CoV-2 RNA is generally detectable in upper respiratory specimens during the acute phase of infection. Positive results are indicative of the presence of the identified virus, but do not rule out bacterial infection or co-infection with other pathogens not detected by the test. Clinical correlation with patient history and other diagnostic information is necessary to determine patient infection status. The expected result is Negative.  Fact Sheet for Patients: EntrepreneurPulse.com.au  Fact Sheet for Healthcare Providers: IncredibleEmployment.be  This test is not yet approved or cleared by the [place] FDA and  has been authorized for detection and/or diagnosis of SARS-CoV-2 by FDA under an Emergency Use Authorization (EUA).  This EUA will remain in effect (meaning this test can be used) for the duration of  the COVID-19 declaration under Section 564(b)(1) of the A ct, 21 U.S.C. section 360bbb-3(b)(1), unless the authorization is terminated or revoked sooner.     Influenza A by PCR NEGATIVE NEGATIVE Final   Influenza B by PCR NEGATIVE NEGATIVE Final    Comment: (NOTE) The Xpert Xpress SARS-CoV-2/FLU/RSV plus assay is intended as an aid in the diagnosis of influenza from Nasopharyngeal swab specimens and should not be used as a sole basis for treatment. Nasal washings and aspirates are unacceptable for Xpert Xpress SARS-CoV-2/FLU/RSV testing.  Fact Sheet for Patients: EntrepreneurPulse.com.au  Fact Sheet for Healthcare Providers: IncredibleEmployment.be  This test is not yet approved or cleared by the [place] FDA and has been authorized for detection  and/or diagnosis of SARS-CoV-2 by FDA under an Emergency Use Authorization (EUA). This EUA will remain in effect (meaning this test can be used) for the duration of the COVID-19 declaration under Section 564(b)(1) of the Act, 21 U.S.C. section 360bbb-3(b)(1), unless the authorization is terminated or revoked.  Performed at [facility], [address], [place], [place] [address]      Scheduled Meds:  ascorbic acid  500 mg Oral Daily   buPROPion  150 mg Oral Daily   busPIRone  5 mg Oral BID   cholecalciferol  1,000 Units Oral Daily   enoxaparin (LOVENOX) injection  85 mg Subcutaneous [id number]   folic acid  1 mg Oral Daily   linaclotide  290 mcg Oral QAC breakfast   metoprolol tartrate  12.5 mg Oral BID   nystatin  5  mL Oral QID   pantoprazole  40 mg Oral BID   polyethylene glycol  17 g Oral BID   [START ON 04/26/2021] predniSONE  40 mg Oral Q breakfast   Followed by   Derrill Memo ON 04/27/2021] predniSONE  30 mg Oral Q breakfast   Followed by   Derrill Memo ON 04/28/2021] predniSONE  20 mg Oral Q breakfast   Followed by   Derrill Memo ON 04/29/2021] predniSONE  10 mg Oral Q breakfast   pregabalin  75 mg Oral TID   vitamin B-12  500 mcg Oral Daily   zinc sulfate  220 mg Oral Daily   Continuous Infusions:  sodium chloride 10 mL/hr at 04/22/21 1019   Immune Globulin 10%      Procedures/Studies: CT Soft Tissue Neck W Contrast  Result Date: 04/21/2021 CLINICAL DATA:  Possible throat swelling starting 3 days ago, difficulty swelling EXAM: CT NECK WITH CONTRAST TECHNIQUE: Multidetector CT imaging of the neck was performed using the standard protocol following the bolus administration of intravenous contrast. RADIATION DOSE REDUCTION: This exam was performed according to the departmental dose-optimization program which includes automated exposure control, adjustment of the mA and/or kV according to patient size and/or use of iterative reconstruction technique. CONTRAST:  78mL OMNIPAQUE IOHEXOL 300 MG/ML  SOLN  COMPARISON:  None. FINDINGS: Pharynx and larynx: The nasal cavity and nasopharynx unremarkable. The bilateral palatine tonsils are mildly prominent and partially efface the oropharyngeal airway the without abnormal enhancement or fluid collection. The oral cavity and oropharynx are otherwise unremarkable. The parapharyngeal spaces are clear. The tongue is normal in appearance. The hypopharynx and larynx are unremarkable. The vocal folds are normal in appearance. Salivary glands: The parotid glands are unremarkable. There is fatty atrophy of the submandibular glands. Thyroid: Unremarkable. Lymph nodes: There is a 9 mm left level II lymph node and 7 mm right level II node, nonspecific and not pathologically enlarged by size criteria. There is no pathologic lymphadenopathy in the neck. Vascular: Unremarkable. Limited intracranial: Imaged portions of the intracranial compartment are unremarkable. Visualized orbits: Globes and orbits are unremarkable. Mastoids and visualized paranasal sinuses: The imaged paranasal sinuses are clear. The mastoid air cells are clear. Skeleton: There is no acute osseous abnormality or aggressive osseous lesion. Upper chest: Are patchy opacities in the lung apices, overall slightly improved compared to the CTA chest from 04/12/2021. Other: None. IMPRESSION: 1. Prominent bilateral palatine tonsils which partially efface the oropharyngeal airway but without abnormal enhancement or mass lesion. No abnormal fluid collection. 2. Subcentimeter bilateral level II lymph nodes, nonspecific and may be reactive. No pathologic lymphadenopathy in the neck. 3. Patchy opacities in the lung apices likely reflecting infection, improved compared to the CTA chest from 04/12/2021. Electronically Signed   By: Valetta Mole M.D.   On: 04/21/2021 12:04   CT Angio Chest Pulmonary Embolism (PE) W or WO Contrast  Result Date: 04/12/2021 CLINICAL DATA:  Concern for pulmonary embolism. EXAM: CT ANGIOGRAPHY CHEST  WITH CONTRAST TECHNIQUE: Multidetector CT imaging of the chest was performed using the standard protocol during bolus administration of intravenous contrast. Multiplanar CT image reconstructions and MIPs were obtained to evaluate the vascular anatomy. RADIATION DOSE REDUCTION: This exam was performed according to the departmental dose-optimization program which includes automated exposure control, adjustment of the mA and/or kV according to patient size and/or use of iterative reconstruction technique. CONTRAST:  176mL OMNIPAQUE IOHEXOL 350 MG/ML SOLN COMPARISON:  CT dated 11/03/2020 and chest radiograph dated 04/12/2021. FINDINGS: Evaluation of this exam is limited due  to respiratory motion artifact. Cardiovascular: Top-normal cardiac size. No pericardial effusion. The thoracic aorta is unremarkable. Evaluation of the pulmonary arteries is very limited due to severe respiratory motion artifact. No large or central pulmonary artery embolus identified. Mediastinum/Nodes: No hilar or mediastinal adenopathy. The esophagus is grossly unremarkable. No mediastinal fluid collection. Lungs/Pleura: Bilateral patchy airspace opacities most concerning for multilobar pneumonia. No pleural effusion pneumothorax. The central airways are patent. Upper Abdomen: Severe fatty liver. Musculoskeletal: No chest wall abnormality. No acute or significant osseous findings. Review of the MIP images confirms the above findings. IMPRESSION: 1. No CT evidence of central pulmonary artery embolus. 2. Multifocal pneumonia. Clinical correlation and follow-up to resolution recommended. 3. Severe fatty liver. Electronically Signed   By: Anner Crete M.D.   On: 04/12/2021 02:21   MR BRAIN W WO CONTRAST  Result Date: 04/21/2021 CLINICAL DATA:  Numbness or tingling, paresthesia (Ped 0-17y) tongue paresthesias, lump in throat after covid EXAM: MRI HEAD WITHOUT AND WITH CONTRAST TECHNIQUE: Multiplanar, multiecho pulse sequences of the brain and  surrounding structures were obtained without and with intravenous contrast. CONTRAST:  20mL GADAVIST GADOBUTROL 1 MMOL/ML IV SOLN COMPARISON:  12/20/2020 FINDINGS: Brain: There is no acute infarction or intracranial hemorrhage. There is no intracranial mass, mass effect, or edema. There is no hydrocephalus or extra-axial fluid collection. Ventricles and sulci are normal in size and configuration. No abnormal enhancement. Vascular: Major vessel flow voids at the skull base are preserved. Skull and upper cervical spine: Normal marrow signal is preserved. Sinuses/Orbits: Paranasal sinuses are aerated. Orbits are unremarkable. Other: Sella is unremarkable. Mastoid air cells are clear. Prominence of the palatine tonsils. IMPRESSION: No evidence of recent infarction, hemorrhage, or mass. No abnormal enhancement. Prominence of the palatine tonsils. Electronically Signed   By: Macy Mis M.D.   On: 04/21/2021 17:09   MR CERVICAL SPINE W WO CONTRAST  Result Date: 04/24/2021 EXAM: MRI CERVICAL SPINE WITHOUT AND WITH CONTRAST TECHNIQUE: Multiplanar and multiecho pulse sequences of the cervical spine, to include the craniocervical junction and cervicothoracic junction, were obtained without and with intravenous contrast. CONTRAST:  17mL GADAVIST GADOBUTROL 1 MMOL/ML IV SOLN COMPARISON:  None. FINDINGS: Alignment: Physiologic. Vertebrae: No fracture, evidence of discitis, or bone lesion. Cord: Normal signal and morphology.  No abnormal enhancement. Posterior Fossa, vertebral arteries, paraspinal tissues: Negative. Disc levels: C2-3: No significant disc bulge. No neural foraminal stenosis. No central canal stenosis. C3-4: No significant disc bulge. No neural foraminal stenosis. No central canal stenosis. C4-5: No significant disc bulge. No neural foraminal stenosis. No central canal stenosis. C5-6: No significant disc bulge. No neural foraminal stenosis. No central canal stenosis. C6-7: No significant disc bulge. No  neural foraminal stenosis. No central canal stenosis. C7-T1: No significant disc bulge. No neural foraminal stenosis. No central canal stenosis. IMPRESSION: Normal examination. Electronically Signed   By: Keane Police D.O.   On: 04/24/2021 17:06   DG Chest Port 1 View  Result Date: 04/12/2021 CLINICAL DATA:  Shortness of breath. EXAM: PORTABLE CHEST 1 VIEW COMPARISON:  Chest radiograph dated 01/02/2021. FINDINGS: Shallow inspiration. There is mild cardiomegaly with mild vascular congestion. Left upper lobe streaky densities may represent vascular congestion. Developing infiltrate is not excluded. Clinical correlation is recommended. No focal consolidation, pleural effusion, or pneumothorax. No acute osseous pathology. IMPRESSION: 1. Mild cardiomegaly with mild vascular congestion. 2. Left upper lobe streaky densities may represent vascular congestion versus developing infiltrate. Electronically Signed   By: Anner Crete M.D.   On: 04/12/2021 00:38  US Abdomen Limited RUQ (LIVER/GB)  Result Date: 04/22/2021 CLINICAL DATA:  Abnormal liver function tests EXAM: ULTRASOUND ABDOMEN LIMITED RIGHT UPPER QUADRANT COMPARISON:  01/03/2021 FINDINGS: Gallbladder: There are hyperechoic foci in the dependent portion of gallbladder suggesting gallbladder stones. There is no definite demonstrable intraluminal mobility in the submitted images. There is no wall thickening. Technologist did not observe any tenderness over the gallbladder. Common bile duct: Diameter: 3 mm Liver: There is increased echogenicity suggesting fatty infiltration. No focal abnormality is seen in the visualized portions of liver. Portal vein is patent on color Doppler imaging with normal direction of blood flow towards the liver. Other: None. IMPRESSION: Linear hyperechoic focus seen in the posterior margin of gallbladder may suggest layering of small stones or calcification in the gallbladder wall. There are no signs of acute cholecystitis. Fatty  liver. Electronically Signed   By: Elmer Picker M.D.   On: 04/22/2021 12:12    Orson Eva, DO  [facility]  If 7PM-7AM, please contact night-coverage [email or web address] Password [email or web address] 04/25/2021, 2:54 PM   LOS: 4 days

## 2021-04-25 NOTE — Progress Notes (Addendum)
I connected with  Carla Little on 04/25/21 by a video enabled telemedicine application and verified that I am speaking with the correct person using two identifiers.   I discussed the limitations of evaluation and management by telemedicine. The patient expressed understanding and agreed to proceed.  Location of patient: [facility] Location of physician: [facility]   Subjective: Patient states her throat clearing sensation is improving but continues to have numbness over her lips as well tongue and mouth.  ROS: negative except above  Examination  Vital signs in last 24 hours: Temp:  [97.6 F (36.4 C)-99 F (37.2 C)] 98.2 F (36.8 C) (01/31 0503) Pulse Rate:  [99-109] 99 (01/31 0503) Resp:  [16-18] 16 (01/31 0503) BP: (103-116)/(59-78) 111/65 (01/31 0503) SpO2:  [94 %-99 %] 94 % (01/31 0503)  Constitutional: Appears well-developed and well-nourished.  Psych: Affect appropriate to situation Eyes: No scleral injection HENT: No OP obstrucion Respiratory: Effort normal, non-labored breathing Neuro: AOx3, cranial nerves II to XII grossly appears grossly intact except patient reports paresthesias of tongue, antigravity strength in bilateral upper extremities without drift, minimal antigravity strength in bilateral lower extremities since GBS    Basic Metabolic Panel: Recent Labs  Lab 04/21/21 1002 04/22/21 0458 04/23/21 0556 04/24/21 0430 04/25/21 0533  NA 137 139 140 138 135  K 3.5 3.5 3.3* 3.4* 3.5  CL 96* 100 102 99 99  CO2 28 26 27 28 27   GLUCOSE 78 137* 121* 96 76  BUN 6 7 15 16 13   CREATININE 0.31* <0.30* 0.33* 0.34* <0.30*  CALCIUM 9.0 9.1 8.9 9.0 8.4*  MG  --  1.8  --   --   --     CBC: Recent Labs  Lab 04/21/21 1002 04/22/21 0458 04/23/21 0556 04/24/21 0430 04/25/21 0533  WBC 14.8* 10.0 10.5 11.3* 12.3*  NEUTROABS 10.8*  --   --   --   --   HGB 10.9* 10.2* 10.0* 9.4* 8.7*  HCT 35.0* 32.4* 31.6* 30.5* 29.8*  MCV 85.0 85.9 85.6  87.1 92.5  PLT 398 349 367 325 263     Coagulation Studies: Recent Labs    04/24/21 0430 04/25/21 0533  LABPROT 14.7 15.1  INR 1.2 1.2    Imaging MRI brain with and without contrast 04/21/2018: No acute abnormality.   ASSESSMENT AND PLAN: 31 year old female with recent COVID infection now with sensation lump in throat and tongue paresthesias   Suspected peripheral neuropathy due to COVID infection -Throat closing sensation improving, persistent oral paresthesias   Recommendations: -We will order prednisone taper and start IVIG for 5 days -From case reports, it is difficult to prognosticate if patient's paresthesias will improve or not.  However, throat closing sensation is improving which is what was more bothersome to patient. -If patient has any worsening of symptoms, please transfer to [place] discussed in detail with patient and Dr. Carles Collet -As the effects of IVIG can\ take a few weeks, it is okay to discharge patient after IVIG and follow-up with neurology 6 to 8 weeks.   I have spent a total of 36  minutes with the patient reviewing hospital notes,  test results, labs and examining the patient as well as establishing an assessment and plan that was discussed personally with the patient.  > 50% of time was spent in direct patient care.   Zeb Comfort Epilepsy [facility] For questions after 5pm please refer to AMION to reach the Neurologist on call

## 2021-04-25 NOTE — Progress Notes (Signed)
Subjective: Feeling well. Denies abdominal pain, mental status changes, BRBPR, melena, nausea, vomiting, reflux symptoms, dysphagia.  Globus sensation is improving, now intermittent.   Objective: Vital signs in last 24 hours: Temp:  [97.6 F (36.4 C)-99 F (37.2 C)] 98.2 F (36.8 C) (01/31 0503) Pulse Rate:  [99-107] 99 (01/31 0503) Resp:  [16-18] 16 (01/31 0503) BP: (103-112)/(59-74) 111/65 (01/31 0503) SpO2:  [94 %-99 %] 94 % (01/31 0503) Last BM Date: 04/24/21 General:   Alert and oriented, pleasant Head:  Normocephalic and atraumatic. Eyes:  No icterus, sclera clear. Conjuctiva pink.  Abdomen:  Bowel sounds present, soft, obese, non-tender, non-distended. No HSM or hernias noted. No rebound or guarding. No masses appreciated  Extremities:  Without clubbing or edema. Neurologic:  Alert and  oriented x4;  grossly normal neurologically. Skin:  Warm and dry, intact without significant lesions.  Cervical Nodes:  No significant cervical adenopathy. Psych:  Alert and cooperative. Normal mood and affect.  Intake/Output from previous day: 01/30 0701 - 01/31 0700 In: 1754.5 [P.O.:1304; I.V.:450.5] Out: 500 [Urine:500] Intake/Output this shift: Total I/O In: 236 [P.O.:236] Out: -   Lab Results: Recent Labs    04/23/21 0556 04/24/21 0430 04/25/21 0533  WBC 10.5 11.3* 12.3*  HGB 10.0* 9.4* 8.7*  HCT 31.6* 30.5* 29.8*  PLT 367 325 263   BMET Recent Labs    04/23/21 0556 04/24/21 0430 04/25/21 0533  NA 140 138 135  K 3.3* 3.4* 3.5  CL 102 99 99  CO2 '27 28 27  ' GLUCOSE 121* 96 76  BUN '15 16 13  ' CREATININE 0.33* 0.34* <0.30*  CALCIUM 8.9 9.0 8.4*   LFT Recent Labs    04/23/21 0556 04/24/21 0430 04/25/21 0533  PROT 6.7 6.7 6.4*  ALBUMIN 3.0* 3.0* 2.6*  AST 463* 626* 434*  ALT 181* 252* 218*  ALKPHOS 109 106 83  BILITOT 3.5* 3.8* 4.6*   PT/INR Recent Labs    04/24/21 0430 04/25/21 0533  LABPROT 14.7 15.1  INR 1.2 1.2   Studies/Results: MR  CERVICAL SPINE W WO CONTRAST  Result Date: 04/24/2021 EXAM: MRI CERVICAL SPINE WITHOUT AND WITH CONTRAST TECHNIQUE: Multiplanar and multiecho pulse sequences of the cervical spine, to include the craniocervical junction and cervicothoracic junction, were obtained without and with intravenous contrast. CONTRAST:  32m GADAVIST GADOBUTROL 1 MMOL/ML IV SOLN COMPARISON:  None. FINDINGS: Alignment: Physiologic. Vertebrae: No fracture, evidence of discitis, or bone lesion. Cord: Normal signal and morphology.  No abnormal enhancement. Posterior Fossa, vertebral arteries, paraspinal tissues: Negative. Disc levels: C2-3: No significant disc bulge. No neural foraminal stenosis. No central canal stenosis. C3-4: No significant disc bulge. No neural foraminal stenosis. No central canal stenosis. C4-5: No significant disc bulge. No neural foraminal stenosis. No central canal stenosis. C5-6: No significant disc bulge. No neural foraminal stenosis. No central canal stenosis. C6-7: No significant disc bulge. No neural foraminal stenosis. No central canal stenosis. C7-T1: No significant disc bulge. No neural foraminal stenosis. No central canal stenosis. IMPRESSION: Normal examination. Electronically Signed   By: IKeane PoliceD.O.   On: 04/24/2021 17:06    Assessment: 31 y.o. 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 [place] [facility] [place] [address] [place] 1/17-1/23 due to Covid. Treated previously with 5 days of remdesivir. 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   Elevated  transaminases: Dating back to Sept 2022, new onset at that time, which coincides with acute illness.  LFTs peaked yesterday with AST 626, ALT 252, alk phos remained normal, total bilirubin 3.8.  Today, AST and ALT have improved, down to 434 and 218  respectively, total bilirubin increased slightly to 4.6, had been up to 5.3 on 1/27.  Bilirubin was fractionated on 1/28 with direct greater than indirect.  [place] with fatty liver. Hep C antibody negative. Hep B surface antigen negative. Denies any ETOH, herbal supplements. Denies any family history of liver disease or prior history of liver disease.  She was on Depakote and this has been discontinued.  Suspect abnormalities in setting of known GBS, Covid, superimposed on known fatty liver.  CMV negative. Multiple serologies are pending including EBV, autoimmune serologies, ceruloplasmin, alpha-1 antitrypsin.  Thankfully, INR has remained normal and she has no signs or symptoms of acute decompensation. Notably, she was started on IVIG yesterday.   Plan: Follow HFP and INR daily. Follow-up on pending serologies.   LOS: 4 days    04/25/2021, 1:10 PM   Aliene Altes, [facility]

## 2021-04-25 NOTE — Progress Notes (Signed)
Patient interviewed and examined.  Patient says her appetite is not normal.  She is eating 50% of meals.  She has not experienced nausea or vomiting.  She denies abdominal pain. Abdominal exam is unremarkable but somewhat limited by body habitus.  Assessment:  Elevated transaminases since September 2020 when she was diagnosed with Guillain-Barr syndrome.  Transaminases peaked during this admission and now coming down.  Work-up so far negative.  Multiple markers are pending. Differential diagnosis includes drug-induced hepatic injury autoimmune hepatitis or secondary to COVID.

## 2021-04-26 DIAGNOSIS — R7989 Other specified abnormal findings of blood chemistry: Secondary | ICD-10-CM

## 2021-04-26 DIAGNOSIS — D649 Anemia, unspecified: Secondary | ICD-10-CM

## 2021-04-26 LAB — COMPREHENSIVE METABOLIC PANEL
ALT: 191 U/L — ABNORMAL HIGH (ref 0–44)
AST: 313 U/L — ABNORMAL HIGH (ref 15–41)
Albumin: 2.6 g/dL — ABNORMAL LOW (ref 3.5–5.0)
Alkaline Phosphatase: 77 U/L (ref 38–126)
Anion gap: 7 (ref 5–15)
BUN: 9 mg/dL (ref 6–20)
CO2: 26 mmol/L (ref 22–32)
Calcium: 8.7 mg/dL — ABNORMAL LOW (ref 8.9–10.3)
Chloride: 100 mmol/L (ref 98–111)
Creatinine, Ser: <0.3 mg/dL — ABNORMAL LOW (ref 0.44–1.00)
Glucose, Bld: 68 mg/dL — ABNORMAL LOW (ref 70–99)
Potassium: 3.7 mmol/L (ref 3.5–5.1)
Sodium: 133 mmol/L — ABNORMAL LOW (ref 135–145)
Total Bilirubin: 3.5 mg/dL — ABNORMAL HIGH (ref 0.3–1.2)
Total Protein: 6.9 g/dL (ref 6.5–8.1)

## 2021-04-26 LAB — HCV RNA QUANT RFLX ULTRA OR GENOTYP
HCV RNA Qnt(log copy/mL): UNDETERMINED log10 IU/mL
HepC Qn: NOT DETECTED IU/mL

## 2021-04-26 LAB — GLUCOSE, CAPILLARY
Glucose-Capillary: 107 mg/dL — ABNORMAL HIGH (ref 70–99)
Glucose-Capillary: 102 mg/dL — ABNORMAL HIGH (ref 70–99)
Glucose-Capillary: 153 mg/dL — ABNORMAL HIGH (ref 70–99)
Glucose-Capillary: 116 mg/dL — ABNORMAL HIGH (ref 70–99)

## 2021-04-26 LAB — CBC
HCT: 29.5 % — ABNORMAL LOW (ref 36.0–46.0)
Hemoglobin: 8.7 g/dL — ABNORMAL LOW (ref 12.0–15.0)
MCH: 27 pg (ref 26.0–34.0)
MCHC: 29.5 g/dL — ABNORMAL LOW (ref 30.0–36.0)
MCV: 91.6 fL (ref 80.0–100.0)
Platelets: 287 10*3/uL (ref 150–400)
RBC: 3.22 MIL/uL — ABNORMAL LOW (ref 3.87–5.11)
RDW: 23.5 % — ABNORMAL HIGH (ref 11.5–15.5)
WBC: 12.7 10*3/uL — ABNORMAL HIGH (ref 4.0–10.5)
nRBC: 0 % (ref 0.0–0.2)

## 2021-04-26 LAB — C-REACTIVE PROTEIN: CRP: 4.5 mg/dL — ABNORMAL HIGH (ref <1.0)

## 2021-04-26 LAB — IGG, IGA, IGM
IgA: 194 mg/dL (ref 87–352)
IgG (Immunoglobin G), Serum: 1332 mg/dL (ref 586–1602)
IgM (Immunoglobulin M), Srm: 197 mg/dL (ref 26–217)

## 2021-04-26 LAB — FERRITIN: Ferritin: 135 ng/mL (ref 11–307)

## 2021-04-26 LAB — ALPHA-1-ANTITRYPSIN: A-1 Antitrypsin, Ser: 106 mg/dL (ref 100–188)

## 2021-04-26 LAB — ANA W/REFLEX IF POSITIVE: Anti Nuclear Antibody (ANA): NEGATIVE

## 2021-04-26 LAB — ANTI-SMOOTH MUSCLE ANTIBODY, IGG: F-Actin IgG: 8 Units (ref 0–19)

## 2021-04-26 LAB — MITOCHONDRIAL ANTIBODIES: Mitochondrial M2 Ab, IgG: <20 Units (ref 0.0–20.0)

## 2021-04-26 LAB — CERULOPLASMIN: Ceruloplasmin: 17 mg/dL — ABNORMAL LOW (ref 19.0–39.0)

## 2021-04-26 LAB — EPSTEIN BARR VRS(EBV DNA BY PCR): EBV DNA QN by PCR: NEGATIVE IU/mL

## 2021-04-26 LAB — MAGNESIUM: Magnesium: 1.8 mg/dL (ref 1.7–2.4)

## 2021-04-26 MED ORDER — OXYCODONE HCL 5 MG PO TABS
5.0000 mg | ORAL_TABLET | Freq: Two times a day (BID) | ORAL | Status: DC | PRN
Start: 2021-04-26 — End: 2021-04-29
  Administered 2021-04-26 – 2021-04-28 (×4): 5 mg via ORAL
  Filled 2021-04-26 (×4): qty 1

## 2021-04-26 NOTE — Progress Notes (Signed)
Subjective: Patient reports ongoing chronic left knee pain from previous knee injury. Denies abdominal pain, nausea or vomiting. Had a BM just a few minutes ago in her brief, though unsure of BRBPR or melena as she has not yet been changed by nursing staff. Pruritus has resolved. Reports swallowing has improved but still having tingling in her mouth.  Patient does tell me that she has hx of PUD, ulcer discovered in August during EGD, repeat EGD in October without any obvious lesions in stomach. She endorses heavy NSAID use prior to development of gastric ulcer but denies any further NSAID use after diagnosis.   Objective: Vital signs in last 24 hours: Temp:  [97.9 F (36.6 C)-98.8 F (37.1 C)] 98.3 F (36.8 C) (02/01 0631) Pulse Rate:  [82-103] 103 (02/01 0631) Resp:  [18-20] 18 (02/01 0631) BP: (108-120)/(62-85) 120/67 (02/01 0631) SpO2:  [94 %-99 %] 97 % (02/01 0631) Last BM Date: 04/24/21 General:   Alert and oriented, pleasant Head:  Normocephalic and atraumatic. Eyes:  No icterus, sclera clear. Conjuctiva pink.  Mouth:  Without lesions, mucosa pink and moist.  Heart:  S1, S2 present, no murmurs noted.  Lungs: Clear to auscultation bilaterally, without wheezing, rales, or rhonchi.  Abdomen:  Bowel sounds present, full but soft, non tender. No HSM or hernias noted. No rebound or guarding. No masses appreciated  Msk:  Symmetrical without gross deformities. Normal posture. Pulses:  Normal pulses noted. Extremities:  non pitting edema present to bilateral LEs Neurologic:  Alert and  oriented x4;  grossly normal neurologically. Skin:  Warm and dry, intact without significant lesions.  Psych:  Alert and cooperative. Normal mood and affect.  Intake/Output from previous day: 01/31 0701 - 02/01 0700 In: 836 [P.O.:836] Out: 1100 [Urine:1100] Intake/Output this shift: No intake/output data recorded.  Lab Results: Recent Labs    04/24/21 0430 04/25/21 0533 04/26/21 0531  WBC  11.3* 12.3* 12.7*  HGB 9.4* 8.7* 8.7*  HCT 30.5* 29.8* 29.5*  PLT 325 263 287   BMET Recent Labs    04/24/21 0430 04/25/21 0533 04/26/21 0531  NA 138 135 133*  K 3.4* 3.5 3.7  CL 99 99 100  CO2 28 27 26   GLUCOSE 96 76 68*  BUN 16 13 9   CREATININE 0.34* <0.30* <0.30*  CALCIUM 9.0 8.4* 8.7*   LFT Recent Labs    04/24/21 0430 04/25/21 0533 04/26/21 0531  PROT 6.7 6.4* 6.9  ALBUMIN 3.0* 2.6* 2.6*  AST 626* 434* 313*  ALT 252* 218* 191*  ALKPHOS 106 83 77  BILITOT 3.8* 4.6* 3.5*   PT/INR Recent Labs    04/24/21 0430 04/25/21 0533  LABPROT 14.7 15.1  INR 1.2 1.2    Studies/Results: MR CERVICAL SPINE W WO CONTRAST  Result Date: 04/24/2021 EXAM: MRI CERVICAL SPINE WITHOUT AND WITH CONTRAST TECHNIQUE: Multiplanar and multiecho pulse sequences of the cervical spine, to include the craniocervical junction and cervicothoracic junction, were obtained without and with intravenous contrast. CONTRAST:  69mL GADAVIST GADOBUTROL 1 MMOL/ML IV SOLN COMPARISON:  None. FINDINGS: Alignment: Physiologic. Vertebrae: No fracture, evidence of discitis, or bone lesion. Cord: Normal signal and morphology.  No abnormal enhancement. Posterior Fossa, vertebral arteries, paraspinal tissues: Negative. Disc levels: C2-3: No significant disc bulge. No neural foraminal stenosis. No central canal stenosis. C3-4: No significant disc bulge. No neural foraminal stenosis. No central canal stenosis. C4-5: No significant disc bulge. No neural foraminal stenosis. No central canal stenosis. C5-6: No significant disc bulge. No neural foraminal stenosis. No central  canal stenosis. C6-7: No significant disc bulge. No neural foraminal stenosis. No central canal stenosis. C7-T1: No significant disc bulge. No neural foraminal stenosis. No central canal stenosis. IMPRESSION: Normal examination. Electronically Signed   By: Keane Police D.O.   On: 04/24/2021 17:06    Assessment: 31 -year-old female diagnosed with GBS in  Sept 2022, presenting with sensation of throat tightening with onset 04/19/21. She has been residing at [facility] since Nov [id number]. Hospitalized 1/17-1/23 due to Covid. Treated previously with 5 days of remdesivir. 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   Elevated transaminases: since sept 2020 when diagnosed with GBS, though transaminases peaked during this admission, are now trending down. Fractionated bilirubin on 1/28 with direct> indirect. [place] with fatty liver. Hep C Ab negative, Hep B surface Ag negative, CMV negative, iGG, IgA and IgM WNL, ceruloplasmin 17, A1A 106, CRP 4.5, Ferritin 135. Multiple serologies still pending.   INR has remained normal and there have been no s/s of acute decompensation. Started on IVIG yesterday and steroid taper today per neurology recommendation. Pt denies ETOH or herbal supplements. No family hx of liver disease, previously on depakote which has been discontinued. Work up thus far is unrevealing.  Differentials include drug-induced hepatic injury, AIH, Wilson's disease or elevation secondary to COVID.  Anemia: hgb 8.7 today with MCV 91.6, ferritin 115, not consistent with IDA, appears baseline hgb may be around 10, though suspected anemia could be related to heavy menstruation in the past. She does not take any iron supplements on outpatient basis.  She denies any hx of melena or rectal bleeding prior to admission. EGD and flex sig done in oct 2022 (prep poor, internal hemorrhoids), had previous gastric ulcer on EGD in august secondary to heavy NSAID use, however, repeat EGD in oct was without any gastric lesions, she has not used NSAIDs since PUD diagnosis. Denies sob, dizziness, or lightheadedness.   Plan: Trend LFTs and INR daily Await pending serologies Continue supportive measures Continue PPI BID Monitor for overt GI bleeding Trend H&H daily   LOS:  5 days    04/26/2021, 10:22 AM   Carla Donoso L. Alver Sorrow, MSN, APRN, AGNP-C Adult-Gerontology Nurse Practitioner [facility]

## 2021-04-26 NOTE — Progress Notes (Signed)
Pt bathed, bed linens changed. Pt had medium sized soft formed stool, cleaned. Pt c/o feeling "raw" in area around anus and perineum. Redness noted with cleaning, protective cream applied. Wound care completed to stage 2 pressure wounds on bilateral buttocks/upper thigh areas. Cleaned, dressed with foam dressing.  Pt c/o excessive wetness under abdominal pannus. After bath, area dried and dry-wick fabric placed to control moisture and protect skin. Pt states area feels much better.

## 2021-04-26 NOTE — Progress Notes (Addendum)
PROGRESS NOTE  Carla Little [id number] DOB: October 28, 1990 DOA: 04/21/2021 PCP: Patient, No Pcp Per (Inactive)    Brief History:   31 y.o. 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  Notably, patient had a hospitalization from 12/09/2020 to 01/26/2021 during which time she was treated for AIDP when she received 5 doses of IVIG.  She was subsequently discharged to SNF.  She stated that her strength gradually improved.  She continued to have dysesthesias in her arms and legs.  However, was readmitted to the hospital from 04/11/2021 to 04/17/2021 when she was treated for acute respiratory failure secondary to COVID-19 pneumonia.  During that admission, the patient received 5 days of remdesivir and steroids.  She was discharged home with prednisone which she finished on 04/16/2021.  On the next day, she began having the throat closing up sensation.  She denies any frank sore throat or dysphagia or odynophagia.  She denies any fevers, chills, chest pain, worsening shortness of breath, nausea, vomiting, diarrhea, abdominal pain.  She states that her extremity weakness has been improving up until the point where she contracted COVID-19 pneumonia.  Since then, she feels that her extremities have been weaker.  She states that her dysesthesias have been about the same since her COVID-19 pneumonia.  The patient states that she has been able to swallow pills and eat solid food without much difficulty.  There is no dysphagia or odynophagia.  She states that her face and neck have not been swollen.  She states that the Tylenol has not been swollen, but she began having numbness and tingling in her tongue on 04/20/2021. In the ED, patient was afebrile hemodynamically stable with oxygen saturation 92% on room air.  BMP showed a sodium 137, potassium  3.5, CO2 28, serum creatinine 0.31.  AST 259, ALT 127, phosphatase 89, total bilirubin 5.3.  WBC 14.8, hemoglobin 10.9, platelets 3 98,000.  UA was negative for pyuria.  CT of the neck shows mildly prominent palatine tonsils with partial effaced oral pharyngeal airway without any abnormal fluid collection or abnormal enhancement.  The parapharyngeal spaces were clear, and tongue was normal.  There was normal salivary glands.  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 -1/31--pt states tongue still numb but throat closing sensation continues to improve -NIF neg 12; IS 1200 cc>>1445, NIF neg 40>>>continue each shift -04/24/21--discussed with Dr. Justice Deeds IVIG  D#3/5 today 1/31   COVID-19 pneumonia -Patient was previously treated with 5 days of remdesivir -Solu-Medrol 1000 mg daily x3 days--finished on 1/29 -CRP--13.4>>6.8>>3.8>>4.1 -ferritin--115>>84>115>>156 -PCT 0.13 -No requiring oxygen supplementation.   Tranasminasemia -Suspect related to the patient's COVID-19 infection vs autoimmune -Repeat right upper quadrant ultrasound -Fractionated bilirubin--mostly direct -Hepatitis B surface antigen--neg -Hepatitis C antibody--neg -Continue to trend -EBV DNA--pending -CMV DNA--neg -discontinue depakote given elevated LFTs. -Haptoglobin 167 (normal) -consult to GI appreciated>>serologic work up for autoimmune hepatitis still pending. -tolerating diet without abd pain -LFTs trending down; continue to follow levels. -Continue supportive care and treatment with IVIG.  Depression/anxiety -Continue Wellbutrin and  BuSpar -disContinue Depakote due to elevated LFTs -Overall mood is a stable; no suicidal ideation or hallucinations.   Constipation -Continue the use of Linzess.   Morbid obesity -BMI 64.49 -Low calorie diet and portion control discussed with patient.   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  Hypokalemia -Repleted -Magnesium within normal limits -Continue intermittently to follow electrolytes trend.   Chronic leg pain -As needed oxycodone will be provided.     Family Communication:   no Family at bedside   Consultants:  neurology   Code Status:  FULL   DVT Prophylaxis:  [place] Lovenox     Procedures: As Listed in Progress Note Above   Antibiotics: None   Subjective: Patient reports some improvement in her overall numbness sensation; no shortness of breath, no nausea, no vomiting, no diarrhea or abdominal pain.  No overt bleeding.  Afebrile and with good oxygen saturation on room air.  Objective: Vitals:   04/26/21 0631 04/26/21 1123 04/26/21 1300 04/26/21 1642  BP: 120/67 101/67 103/82 120/85  Pulse: (!) 103 (!) 102 98 95  Resp: 18 18 17 18   Temp: 98.3 F (36.8 C)  97.8 F (36.6 C)   TempSrc:   Oral   SpO2: 97% 98% 93% 97%  Weight:      Height:        Intake/Output Summary (Last 24 hours) at 04/26/2021 1657 Last data filed at 04/25/2021 2300 Gross per 24 hour  Intake 120 ml  Output 900 ml  Net -780 ml   Weight change:   Exam: General exam: Alert, awake, oriented x 3, reporting neuropathic chronic pain affecting her legs.  No fever, no nausea, no vomiting, no overt bleeding. Respiratory system: Clear to auscultation. Respiratory effort normal.  No requiring oxygen supplementation.  Positive scattered rhonchi.  No wheezing. Cardiovascular system:RRR. No murmurs, rubs, gallops.  Unable to properly assess JVD with body  habitus. Gastrointestinal system: Abdomen is obese, nondistended, soft and nontender. No organomegaly or masses felt. Normal bowel sounds heard. Central nervous system:No new neurological deficits. Extremities: No cyanosis or clubbing. Skin: No petechiae.  Stage II decubitus gluteal pressure injury present at time of admission; no signs of superimposed infection. Psychiatry: Judgement and insight appear normal. Mood & affect appropriate.    Data Reviewed: I have personally reviewed following labs and imaging studies  Basic Metabolic Panel: Recent Labs  Lab 04/22/21 0458 04/23/21 0556 04/24/21 0430 04/25/21 0533 04/26/21 0531  NA 139 140 138 135 133*  K 3.5 3.3* 3.4* 3.5 3.7  CL 100 102 99 99 100  CO2 26 27 28 27 26   GLUCOSE 137* 121* 96 76 68*  BUN 7 15 16 13 9   CREATININE <0.30* 0.33* 0.34* <0.30* <0.30*  CALCIUM 9.1 8.9 9.0 8.4* 8.7*  MG 1.8  --   --   --  1.8   Liver Function Tests: Recent Labs  Lab 04/22/21 0458 04/22/21 1033 04/23/21 0556 04/24/21 0430 04/25/21 0533 04/26/21 0531  AST 293*  --  463* 626* 434* 313*  ALT 128*  --  181* 252* 218* 191*  ALKPHOS 105  --  109 106 83 77  BILITOT 4.7* 4.6* 3.5* 3.8* 4.6* 3.5*  PROT 6.7  --  6.7 6.7 6.4* 6.9  ALBUMIN 2.9*  --  3.0* 3.0* 2.6* 2.6*   Coagulation Profile: Recent Labs  Lab 04/22/21 0458 04/24/21 0430 04/25/21  0533  INR 1.2 1.2 1.2   CBC: Recent Labs  Lab 04/21/21 1002 04/22/21 0458 04/23/21 0556 04/24/21 0430 04/25/21 0533 04/26/21 0531  WBC 14.8* 10.0 10.5 11.3* 12.3* 12.7*  NEUTROABS 10.8*  --   --   --   --   --   HGB 10.9* 10.2* 10.0* 9.4* 8.7* 8.7*  HCT 35.0* 32.4* 31.6* 30.5* 29.8* 29.5*  MCV 85.0 85.9 85.6 87.1 92.5 91.6  PLT 398 349 367 325 263 287   CBG: Recent Labs  Lab 04/25/21 1128 04/25/21 1609 04/25/21 2209 04/26/21 0716 04/26/21 1111  GLUCAP 94 132* 143* 107* 102*   HbA1C: No results for input(s): HGBA1C in the last 72 hours.  Urine analysis:    Component  Value Date/Time   COLORURINE AMBER (A) 04/15/2021 0624   APPEARANCEUR HAZY (A) 04/15/2021 0624   LABSPEC 1.018 04/15/2021 0624   PHURINE 7.0 04/15/2021 0624   GLUCOSEU NEGATIVE 04/15/2021 0624   HGBUR MODERATE (A) 04/15/2021 0624   BILIRUBINUR NEGATIVE 04/15/2021 0624   KETONESUR NEGATIVE 04/15/2021 0624   PROTEINUR 30 (A) 04/15/2021 0624   UROBILINOGEN 1.0 10/27/2013 2116   NITRITE POSITIVE (A) 04/15/2021 0624   LEUKOCYTESUR MODERATE (A) 04/15/2021 0624   Sepsis Labs:  Recent Results (from the past 240 hour(s))  Resp Panel by RT-PCR (Flu A&B, Covid) Nasopharyngeal Swab     Status: Abnormal   Collection Time: 04/21/21  2:13 PM   Specimen: Nasopharyngeal Swab; Nasopharyngeal(NP) swabs in vial transport medium  Result Value Ref Range Status   SARS Coronavirus 2 by RT PCR POSITIVE (A) NEGATIVE Final    Comment: (NOTE) SARS-CoV-2 target nucleic acids are DETECTED.  The SARS-CoV-2 RNA is generally detectable in upper respiratory specimens during the acute phase of infection. Positive results are indicative of the presence of the identified virus, but do not rule out bacterial infection or co-infection with other pathogens not detected by the test. Clinical correlation with patient history and other diagnostic information is necessary to determine patient infection status. The expected result is Negative.  Fact Sheet for Patients: EntrepreneurPulse.com.au  Fact Sheet for Healthcare Providers: IncredibleEmployment.be  This test is not yet approved or cleared by the [place] FDA and  has been authorized for detection and/or diagnosis of SARS-CoV-2 by FDA under an Emergency Use Authorization (EUA).  This EUA will remain in effect (meaning this test can be used) for the duration of  the COVID-19 declaration under Section 564(b)(1) of the A ct, 21 U.S.C. section 360bbb-3(b)(1), unless the authorization is terminated or revoked sooner.      Influenza A by PCR NEGATIVE NEGATIVE Final   Influenza B by PCR NEGATIVE NEGATIVE Final    Comment: (NOTE) The Xpert Xpress SARS-CoV-2/FLU/RSV plus assay is intended as an aid in the diagnosis of influenza from Nasopharyngeal swab specimens and should not be used as a sole basis for treatment. Nasal washings and aspirates are unacceptable for Xpert Xpress SARS-CoV-2/FLU/RSV testing.  Fact Sheet for Patients: EntrepreneurPulse.com.au  Fact Sheet for Healthcare Providers: IncredibleEmployment.be  This test is not yet approved or cleared by the [place] FDA and has been authorized for detection and/or diagnosis of SARS-CoV-2 by FDA under an Emergency Use Authorization (EUA). This EUA will remain in effect (meaning this test can be used) for the duration of the COVID-19 declaration under Section 564(b)(1) of the Act, 21 U.S.C. section 360bbb-3(b)(1), unless the authorization is terminated or revoked.  Performed at [facility], [address], [place], [place] [address]  Scheduled Meds:  ascorbic acid  500 mg Oral Daily   buPROPion  150 mg Oral Daily   busPIRone  5 mg Oral BID   cholecalciferol  1,000 Units Oral Daily   enoxaparin (LOVENOX) injection  85 mg Subcutaneous Q24H   folic acid  1 mg Oral Daily   linaclotide  290 mcg Oral QAC breakfast   metoprolol tartrate  12.5 mg Oral BID   nystatin  5 mL Oral QID   pantoprazole  40 mg Oral BID   polyethylene glycol  17 g Oral BID   [START ON 04/27/2021] predniSONE  30 mg Oral Q breakfast   Followed by   Melene Muller ON 04/28/2021] predniSONE  20 mg Oral Q breakfast   Followed by   Melene Muller ON 04/29/2021] predniSONE  10 mg Oral Q breakfast   pregabalin  75 mg Oral TID   vitamin B-12  500 mcg Oral Daily   zinc sulfate  220 mg Oral Daily   Continuous Infusions:  sodium chloride 10 mL/hr at 04/22/21 1019   Immune Globulin 10%      Procedures/Studies: CT Soft Tissue Neck W Contrast  Result  Date: 04/21/2021 CLINICAL DATA:  Possible throat swelling starting 3 days ago, difficulty swelling EXAM: CT NECK WITH CONTRAST TECHNIQUE: Multidetector CT imaging of the neck was performed using the standard protocol following the bolus administration of intravenous contrast. RADIATION DOSE REDUCTION: This exam was performed according to the departmental dose-optimization program which includes automated exposure control, adjustment of the mA and/or kV according to patient size and/or use of iterative reconstruction technique. CONTRAST:  70mL OMNIPAQUE IOHEXOL 300 MG/ML  SOLN COMPARISON:  None. FINDINGS: Pharynx and larynx: The nasal cavity and nasopharynx unremarkable. The bilateral palatine tonsils are mildly prominent and partially efface the oropharyngeal airway the without abnormal enhancement or fluid collection. The oral cavity and oropharynx are otherwise unremarkable. The parapharyngeal spaces are clear. The tongue is normal in appearance. The hypopharynx and larynx are unremarkable. The vocal folds are normal in appearance. Salivary glands: The parotid glands are unremarkable. There is fatty atrophy of the submandibular glands. Thyroid: Unremarkable. Lymph nodes: There is a 9 mm left level II lymph node and 7 mm right level II node, nonspecific and not pathologically enlarged by size criteria. There is no pathologic lymphadenopathy in the neck. Vascular: Unremarkable. Limited intracranial: Imaged portions of the intracranial compartment are unremarkable. Visualized orbits: Globes and orbits are unremarkable. Mastoids and visualized paranasal sinuses: The imaged paranasal sinuses are clear. The mastoid air cells are clear. Skeleton: There is no acute osseous abnormality or aggressive osseous lesion. Upper chest: Are patchy opacities in the lung apices, overall slightly improved compared to the CTA chest from 04/12/2021. Other: None. IMPRESSION: 1. Prominent bilateral palatine tonsils which partially efface  the oropharyngeal airway but without abnormal enhancement or mass lesion. No abnormal fluid collection. 2. Subcentimeter bilateral level II lymph nodes, nonspecific and may be reactive. No pathologic lymphadenopathy in the neck. 3. Patchy opacities in the lung apices likely reflecting infection, improved compared to the CTA chest from 04/12/2021. Electronically Signed   By: Lesia Hausen M.D.   On: 04/21/2021 12:04   CT Angio Chest Pulmonary Embolism (PE) W or WO Contrast  Result Date: 04/12/2021 CLINICAL DATA:  Concern for pulmonary embolism. EXAM: CT ANGIOGRAPHY CHEST WITH CONTRAST TECHNIQUE: Multidetector CT imaging of the chest was performed using the standard protocol during bolus administration of intravenous contrast. Multiplanar CT image reconstructions and MIPs were obtained to evaluate the vascular anatomy.  RADIATION DOSE REDUCTION: This exam was performed according to the departmental dose-optimization program which includes automated exposure control, adjustment of the mA and/or kV according to patient size and/or use of iterative reconstruction technique. CONTRAST:  171mL OMNIPAQUE IOHEXOL 350 MG/ML SOLN COMPARISON:  CT dated 11/03/2020 and chest radiograph dated 04/12/2021. FINDINGS: Evaluation of this exam is limited due to respiratory motion artifact. Cardiovascular: Top-normal cardiac size. No pericardial effusion. The thoracic aorta is unremarkable. Evaluation of the pulmonary arteries is very limited due to severe respiratory motion artifact. No large or central pulmonary artery embolus identified. Mediastinum/Nodes: No hilar or mediastinal adenopathy. The esophagus is grossly unremarkable. No mediastinal fluid collection. Lungs/Pleura: Bilateral patchy airspace opacities most concerning for multilobar pneumonia. No pleural effusion pneumothorax. The central airways are patent. Upper Abdomen: Severe fatty liver. Musculoskeletal: No chest wall abnormality. No acute or significant osseous  findings. Review of the MIP images confirms the above findings. IMPRESSION: 1. No CT evidence of central pulmonary artery embolus. 2. Multifocal pneumonia. Clinical correlation and follow-up to resolution recommended. 3. Severe fatty liver. Electronically Signed   By: Anner Crete M.D.   On: 04/12/2021 02:21   MR BRAIN W WO CONTRAST  Result Date: 04/21/2021 CLINICAL DATA:  Numbness or tingling, paresthesia (Ped 0-17y) tongue paresthesias, lump in throat after covid EXAM: MRI HEAD WITHOUT AND WITH CONTRAST TECHNIQUE: Multiplanar, multiecho pulse sequences of the brain and surrounding structures were obtained without and with intravenous contrast. CONTRAST:  22mL GADAVIST GADOBUTROL 1 MMOL/ML IV SOLN COMPARISON:  12/20/2020 FINDINGS: Brain: There is no acute infarction or intracranial hemorrhage. There is no intracranial mass, mass effect, or edema. There is no hydrocephalus or extra-axial fluid collection. Ventricles and sulci are normal in size and configuration. No abnormal enhancement. Vascular: Major vessel flow voids at the skull base are preserved. Skull and upper cervical spine: Normal marrow signal is preserved. Sinuses/Orbits: Paranasal sinuses are aerated. Orbits are unremarkable. Other: Sella is unremarkable. Mastoid air cells are clear. Prominence of the palatine tonsils. IMPRESSION: No evidence of recent infarction, hemorrhage, or mass. No abnormal enhancement. Prominence of the palatine tonsils. Electronically Signed   By: Macy Mis M.D.   On: 04/21/2021 17:09   MR CERVICAL SPINE W WO CONTRAST  Result Date: 04/24/2021 EXAM: MRI CERVICAL SPINE WITHOUT AND WITH CONTRAST TECHNIQUE: Multiplanar and multiecho pulse sequences of the cervical spine, to include the craniocervical junction and cervicothoracic junction, were obtained without and with intravenous contrast. CONTRAST:  33mL GADAVIST GADOBUTROL 1 MMOL/ML IV SOLN COMPARISON:  None. FINDINGS: Alignment: Physiologic. Vertebrae: No  fracture, evidence of discitis, or bone lesion. Cord: Normal signal and morphology.  No abnormal enhancement. Posterior Fossa, vertebral arteries, paraspinal tissues: Negative. Disc levels: C2-3: No significant disc bulge. No neural foraminal stenosis. No central canal stenosis. C3-4: No significant disc bulge. No neural foraminal stenosis. No central canal stenosis. C4-5: No significant disc bulge. No neural foraminal stenosis. No central canal stenosis. C5-6: No significant disc bulge. No neural foraminal stenosis. No central canal stenosis. C6-7: No significant disc bulge. No neural foraminal stenosis. No central canal stenosis. C7-T1: No significant disc bulge. No neural foraminal stenosis. No central canal stenosis. IMPRESSION: Normal examination. Electronically Signed   By: Keane Police D.O.   On: 04/24/2021 17:06   DG Chest Port 1 View  Result Date: 04/12/2021 CLINICAL DATA:  Shortness of breath. EXAM: PORTABLE CHEST 1 VIEW COMPARISON:  Chest radiograph dated 01/02/2021. FINDINGS: Shallow inspiration. There is mild cardiomegaly with mild vascular congestion. Left upper lobe streaky densities  may represent vascular congestion. Developing infiltrate is not excluded. Clinical correlation is recommended. No focal consolidation, pleural effusion, or pneumothorax. No acute osseous pathology. IMPRESSION: 1. Mild cardiomegaly with mild vascular congestion. 2. Left upper lobe streaky densities may represent vascular congestion versus developing infiltrate. Electronically Signed   By: Anner Crete M.D.   On: 04/12/2021 00:38   US Abdomen Limited RUQ (LIVER/GB)  Result Date: 04/22/2021 CLINICAL DATA:  Abnormal liver function tests EXAM: ULTRASOUND ABDOMEN LIMITED RIGHT UPPER QUADRANT COMPARISON:  01/03/2021 FINDINGS: Gallbladder: There are hyperechoic foci in the dependent portion of gallbladder suggesting gallbladder stones. There is no definite demonstrable intraluminal mobility in the submitted images.  There is no wall thickening. Technologist did not observe any tenderness over the gallbladder. Common bile duct: Diameter: 3 mm Liver: There is increased echogenicity suggesting fatty infiltration. No focal abnormality is seen in the visualized portions of liver. Portal vein is patent on color Doppler imaging with normal direction of blood flow towards the liver. Other: None. IMPRESSION: Linear hyperechoic focus seen in the posterior margin of gallbladder may suggest layering of small stones or calcification in the gallbladder wall. There are no signs of acute cholecystitis. Fatty liver. Electronically Signed   By: Elmer Picker M.D.   On: 04/22/2021 12:12    Barton Dubois, MD [facility]  If 7PM-7AM, please contact night-coverage [email or web address] Password [facility] 04/26/2021, 4:57 PM   LOS: 5 days

## 2021-04-27 DIAGNOSIS — D649 Anemia, unspecified: Secondary | ICD-10-CM

## 2021-04-27 DIAGNOSIS — R7989 Other specified abnormal findings of blood chemistry: Secondary | ICD-10-CM

## 2021-04-27 LAB — DIFFERENTIAL
Abs Immature Granulocytes: 0.25 10*3/uL — ABNORMAL HIGH (ref 0.00–0.07)
Basophils Absolute: 0 10*3/uL (ref 0.0–0.1)
Basophils Relative: 0 %
Eosinophils Absolute: 0 10*3/uL (ref 0.0–0.5)
Eosinophils Relative: 0 %
Immature Granulocytes: 2 %
Lymphocytes Relative: 24 %
Lymphs Abs: 3 10*3/uL (ref 0.7–4.0)
Monocytes Absolute: 1.1 10*3/uL — ABNORMAL HIGH (ref 0.1–1.0)
Monocytes Relative: 9 %
Neutro Abs: 8.3 10*3/uL — ABNORMAL HIGH (ref 1.7–7.7)
Neutrophils Relative %: 65 %

## 2021-04-27 LAB — FERRITIN: Ferritin: 120 ng/mL (ref 11–307)

## 2021-04-27 LAB — C-REACTIVE PROTEIN: CRP: 3.8 mg/dL — ABNORMAL HIGH (ref <1.0)

## 2021-04-27 LAB — GLUCOSE, CAPILLARY
Glucose-Capillary: 92 mg/dL (ref 70–99)
Glucose-Capillary: 108 mg/dL — ABNORMAL HIGH (ref 70–99)
Glucose-Capillary: 142 mg/dL — ABNORMAL HIGH (ref 70–99)
Glucose-Capillary: 114 mg/dL — ABNORMAL HIGH (ref 70–99)

## 2021-04-27 LAB — HEPATIC FUNCTION PANEL
ALT: 189 U/L — ABNORMAL HIGH (ref 0–44)
AST: 378 U/L — ABNORMAL HIGH (ref 15–41)
Albumin: 2.5 g/dL — ABNORMAL LOW (ref 3.5–5.0)
Alkaline Phosphatase: 88 U/L (ref 38–126)
Bilirubin, Direct: 1.2 mg/dL — ABNORMAL HIGH (ref 0.0–0.2)
Indirect Bilirubin: 1.5 mg/dL — ABNORMAL HIGH (ref 0.3–0.9)
Total Bilirubin: 2.7 mg/dL — ABNORMAL HIGH (ref 0.3–1.2)
Total Protein: 7.5 g/dL (ref 6.5–8.1)

## 2021-04-27 NOTE — Progress Notes (Signed)
PROGRESS NOTE  Carla Little [id number] DOB: [birth year] DOA: 04/21/2021 PCP: Patient, No Pcp Per (Inactive)    Brief History:   31 y.o. 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  Notably, patient had a hospitalization from 12/09/2020 to 01/26/2021 during which time she was treated for AIDP when she received 5 doses of IVIG.  She was subsequently discharged to SNF.  She stated that her strength gradually improved.  She continued to have dysesthesias in her arms and legs.  However, was readmitted to the hospital from 04/11/2021 to 04/17/2021 when she was treated for acute respiratory failure secondary to COVID-19 pneumonia.  During that admission, the patient received 5 days of remdesivir and steroids.  She was discharged home with prednisone which she finished on 04/16/2021.  On the next day, she began having the throat closing up sensation.  She denies any frank sore throat or dysphagia or odynophagia.  She denies any fevers, chills, chest pain, worsening shortness of breath, nausea, vomiting, diarrhea, abdominal pain.  She states that her extremity weakness has been improving up until the point where she contracted COVID-19 pneumonia.  Since then, she feels that her extremities have been weaker.  She states that her dysesthesias have been about the same since her COVID-19 pneumonia.  The patient states that she has been able to swallow pills and eat solid food without much difficulty.  There is no dysphagia or odynophagia.  She states that her face and neck have not been swollen.  She states that the Tylenol has not been swollen, but she began having numbness and tingling in her tongue on 04/20/2021. In the ED, patient was afebrile hemodynamically stable with oxygen saturation 92% on room air.  BMP showed a sodium 137, potassium  3.5, CO2 28, serum creatinine 0.31.  AST 259, ALT 127, phosphatase 89, total bilirubin 5.3.  WBC 14.8, hemoglobin 10.9, platelets 3 98,000.  UA was negative for pyuria.  CT of the neck shows mildly prominent palatine tonsils with partial effaced oral pharyngeal airway without any abnormal fluid collection or abnormal enhancement.  The parapharyngeal spaces were clear, and tongue was normal.  There was normal salivary glands.  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 -1/31--pt states tongue still numb but throat closing sensation continues to improve -NIF neg 12; IS 1200 cc>>1445, NIF neg 40>>>continue each shift -04/24/21--discussed with Dr. Justice Deeds IVIG  D#4/5 today 1/31   COVID-19 pneumonia -Patient was previously treated with 5 days of remdesivir -Solu-Medrol 1000 mg daily x3 days--finished on 1/29 -CRP--13.4>>6.8>>3.8>>4.1 -ferritin--115>>84>115>>156 -PCT 0.13 -No requiring oxygen supplementation.   Tranasminasemia -Suspect related to the patient's COVID-19 infection vs autoimmune -Repeat right upper quadrant ultrasound -Fractionated bilirubin--mostly direct -Hepatitis B surface antigen--neg -Hepatitis C antibody--neg -Continue to trend -EBV DNA--pending -CMV DNA--neg -discontinue depakote given elevated LFTs. -Haptoglobin 167 (normal) -tolerating diet without abd pain -LFTs continue trending down; continue to follow levels. -Continue supportive care and treatment with IVIG. -Continue to follow GI service recommendations; for now plan to check  peripheral smear to rule out hemolysis and to check serum  copper.   Depression/anxiety -Continue Wellbutrin and BuSpar -disContinue Depakote due to elevated LFTs -Overall mood is a stable; no suicidal ideation or hallucinations.   Constipation -Continue the use of Linzess.   Morbid obesity -BMI 64.49 -Low calorie diet and portion control discussed with patient.   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  Hypokalemia -Repleted -Magnesium within normal limits -Continue intermittently to follow electrolytes trend. -Potassium 3.7 currently.   Chronic leg pain -Continue the use of as needed oxycodone.   Family Communication:   no Family at bedside   Consultants:  neurology   Code Status:  FULL   DVT Prophylaxis:  [place] Lovenox     Procedures: As Listed in Progress Note Above   Antibiotics: None   Subjective: No overt bleeding, no nausea, no chest pain, no shortness of breath.  Patient is afebrile.  Reports feeling stable and expressing improvement in her chronic lower extremity pain with current analgesics.  Objective: Vitals:   04/26/21 1955 04/26/21 2112 04/27/21 1336 04/27/21 1740  BP: 117/85 119/77 114/70 104/72  Pulse: 86 87 93 100  Resp: 20 19 18 16   Temp: 98.2 F (36.8 C) 98 F (36.7 C) 99.9 F (37.7 C) 98.3 F (36.8 C)  TempSrc: Oral Oral Oral Oral  SpO2: 94% 100% 97% 97%  Weight:      Height:        Intake/Output Summary (Last 24 hours) at 04/27/2021 1757 Last data filed at 04/27/2021 1345 Gross per 24 hour  Intake 730 ml  Output --  Net 730 ml   Weight change:   Exam: General exam: Alert, awake, oriented x 3, reports improvement in her leg pain with current analgesic regimen; no nausea, no vomiting, no overt bleeding.  Patient is afebrile. Respiratory system: Good air movement bilaterally.Marland Kitchen Respiratory effort normal.  Good saturation on room air.  No wheezing on  exam. Cardiovascular system:RRR. No murmurs, rubs, gallops.  Unable to properly assess JVD with body habitus. Gastrointestinal system: Abdomen is obese, nondistended, soft and nontender. No organomegaly or masses felt. Normal bowel sounds heard. Central nervous system: Alert and oriented. No focal neurological deficits. Extremities: No cyanosis or clubbing. Skin: No petechiae.  Stage II decubitus gluteal pressure injury present at time of admission without signs of superimposed infection. Psychiatry: Judgement and insight appear normal. Mood & affect appropriate.   Data Reviewed: I have personally reviewed following labs and imaging studies  Basic Metabolic Panel: Recent Labs  Lab 04/22/21 0458 04/23/21 0556 04/24/21 0430 04/25/21 0533 04/26/21 0531  NA 139 140 138 135 133*  K 3.5 3.3* 3.4* 3.5 3.7  CL 100 102 99 99 100  CO2 26 27 28 27 26   GLUCOSE 137* 121* 96 76 68*  BUN 7 15 16 13 9   CREATININE <0.30* 0.33* 0.34* <0.30* <0.30*  CALCIUM 9.1 8.9 9.0 8.4* 8.7*  MG 1.8  --   --   --  1.8   Liver Function Tests: Recent Labs  Lab 04/23/21 0556 04/24/21 0430 04/25/21 0533 04/26/21 0531 04/27/21 0604  AST 463* 626* 434* 313* 378*  ALT 181* 252* 218* 191* 189*  ALKPHOS 109 106 83 77 88  BILITOT 3.5* 3.8* 4.6* 3.5* 2.7*  PROT 6.7 6.7 6.4* 6.9 7.5  ALBUMIN 3.0* 3.0* 2.6* 2.6* 2.5*   Coagulation Profile: Recent Labs  Lab 04/22/21 0458  04/24/21 0430 04/25/21 0533  INR 1.2 1.2 1.2   CBC: Recent Labs  Lab 04/21/21 1002 04/22/21 0458 04/23/21 0556 04/24/21 0430 04/25/21 0533 04/26/21 0531  WBC 14.8* 10.0 10.5 11.3* 12.3* 12.7*  NEUTROABS 10.8*  --   --   --   --  8.3*  HGB 10.9* 10.2* 10.0* 9.4* 8.7* 8.7*  HCT 35.0* 32.4* 31.6* 30.5* 29.8* 29.5*  MCV 85.0 85.9 85.6 87.1 92.5 91.6  PLT 398 349 367 325 263 287   CBG: Recent Labs  Lab 04/26/21 1659 04/26/21 2132 04/27/21 0713 04/27/21 1124 04/27/21 1609  GLUCAP 153* 116* 92 108* 142*   Urine analysis:     Component Value Date/Time   COLORURINE AMBER (A) 04/15/2021 0624   APPEARANCEUR HAZY (A) 04/15/2021 0624   LABSPEC 1.018 04/15/2021 0624   PHURINE 7.0 04/15/2021 0624   GLUCOSEU NEGATIVE 04/15/2021 0624   HGBUR MODERATE (A) 04/15/2021 0624   BILIRUBINUR NEGATIVE 04/15/2021 0624   KETONESUR NEGATIVE 04/15/2021 0624   PROTEINUR 30 (A) 04/15/2021 0624   UROBILINOGEN 1.0 10/27/2013 2116   NITRITE POSITIVE (A) 04/15/2021 0624   LEUKOCYTESUR MODERATE (A) 04/15/2021 0624   Sepsis Labs:  Recent Results (from the past 240 hour(s))  Resp Panel by RT-PCR (Flu A&B, Covid) Nasopharyngeal Swab     Status: Abnormal   Collection Time: 04/21/21  2:13 PM   Specimen: Nasopharyngeal Swab; Nasopharyngeal(NP) swabs in vial transport medium  Result Value Ref Range Status   SARS Coronavirus 2 by RT PCR POSITIVE (A) NEGATIVE Final    Comment: (NOTE) SARS-CoV-2 target nucleic acids are DETECTED.  The SARS-CoV-2 RNA is generally detectable in upper respiratory specimens during the acute phase of infection. Positive results are indicative of the presence of the identified virus, but do not rule out bacterial infection or co-infection with other pathogens not detected by the test. Clinical correlation with patient history and other diagnostic information is necessary to determine patient infection status. The expected result is Negative.  Fact Sheet for Patients: BloggerCourse.comhttps://www.fda.gov/media/152166/download  Fact Sheet for Healthcare Providers: SeriousBroker.ithttps://www.fda.gov/media/152162/download  This test is not yet approved or cleared by the [place] FDA and  has been authorized for detection and/or diagnosis of SARS-CoV-2 by FDA under an Emergency Use Authorization (EUA).  This EUA will remain in effect (meaning this test can be used) for the duration of  the COVID-19 declaration under Section 564(b)(1) of the A ct, 21 U.S.C. section 360bbb-3(b)(1), unless the authorization is terminated or revoked  sooner.     Influenza A by PCR NEGATIVE NEGATIVE Final   Influenza B by PCR NEGATIVE NEGATIVE Final    Comment: (NOTE) The Xpert Xpress SARS-CoV-2/FLU/RSV plus assay is intended as an aid in the diagnosis of influenza from Nasopharyngeal swab specimens and should not be used as a sole basis for treatment. Nasal washings and aspirates are unacceptable for Xpert Xpress SARS-CoV-2/FLU/RSV testing.  Fact Sheet for Patients: BloggerCourse.comhttps://www.fda.gov/media/152166/download  Fact Sheet for Healthcare Providers: SeriousBroker.ithttps://www.fda.gov/media/152162/download  This test is not yet approved or cleared by the [place] FDA and has been authorized for detection and/or diagnosis of SARS-CoV-2 by FDA under an Emergency Use Authorization (EUA). This EUA will remain in effect (meaning this test can be used) for the duration of the COVID-19 declaration under Section 564(b)(1) of the Act, 21 U.S.C. section 360bbb-3(b)(1), unless the authorization is terminated or revoked.  Performed at [facility], [address] [place], [place] [address]      Scheduled Meds:  ascorbic acid  500 mg Oral  Daily   buPROPion  150 mg Oral Daily   busPIRone  5 mg Oral BID   cholecalciferol  1,000 Units Oral Daily   enoxaparin (LOVENOX) injection  85 mg Subcutaneous [id number]   folic acid  1 mg Oral Daily   linaclotide  290 mcg Oral QAC breakfast   metoprolol tartrate  12.5 mg Oral BID   nystatin  5 mL Oral QID   pantoprazole  40 mg Oral BID   polyethylene glycol  17 g Oral BID   [START ON 04/28/2021] predniSONE  20 mg Oral Q breakfast   Followed by   Derrill Memo ON 04/29/2021] predniSONE  10 mg Oral Q breakfast   pregabalin  75 mg Oral TID   vitamin B-12  500 mcg Oral Daily   zinc sulfate  220 mg Oral Daily   Continuous Infusions:  sodium chloride 10 mL/hr at 04/22/21 1019   Immune Globulin 10%      Procedures/Studies: CT Soft Tissue Neck W Contrast  Result Date: 04/21/2021 CLINICAL DATA:  Possible throat swelling  starting 3 days ago, difficulty swelling EXAM: CT NECK WITH CONTRAST TECHNIQUE: Multidetector CT imaging of the neck was performed using the standard protocol following the bolus administration of intravenous contrast. RADIATION DOSE REDUCTION: This exam was performed according to the departmental dose-optimization program which includes automated exposure control, adjustment of the mA and/or kV according to patient size and/or use of iterative reconstruction technique. CONTRAST:  94mL OMNIPAQUE IOHEXOL 300 MG/ML  SOLN COMPARISON:  None. FINDINGS: Pharynx and larynx: The nasal cavity and nasopharynx unremarkable. The bilateral palatine tonsils are mildly prominent and partially efface the oropharyngeal airway the without abnormal enhancement or fluid collection. The oral cavity and oropharynx are otherwise unremarkable. The parapharyngeal spaces are clear. The tongue is normal in appearance. The hypopharynx and larynx are unremarkable. The vocal folds are normal in appearance. Salivary glands: The parotid glands are unremarkable. There is fatty atrophy of the submandibular glands. Thyroid: Unremarkable. Lymph nodes: There is a 9 mm left level II lymph node and 7 mm right level II node, nonspecific and not pathologically enlarged by size criteria. There is no pathologic lymphadenopathy in the neck. Vascular: Unremarkable. Limited intracranial: Imaged portions of the intracranial compartment are unremarkable. Visualized orbits: Globes and orbits are unremarkable. Mastoids and visualized paranasal sinuses: The imaged paranasal sinuses are clear. The mastoid air cells are clear. Skeleton: There is no acute osseous abnormality or aggressive osseous lesion. Upper chest: Are patchy opacities in the lung apices, overall slightly improved compared to the CTA chest from 04/12/2021. Other: None. IMPRESSION: 1. Prominent bilateral palatine tonsils which partially efface the oropharyngeal airway but without abnormal enhancement  or mass lesion. No abnormal fluid collection. 2. Subcentimeter bilateral level II lymph nodes, nonspecific and may be reactive. No pathologic lymphadenopathy in the neck. 3. Patchy opacities in the lung apices likely reflecting infection, improved compared to the CTA chest from 04/12/2021. Electronically Signed   By: Valetta Mole M.D.   On: 04/21/2021 12:04   CT Angio Chest Pulmonary Embolism (PE) W or WO Contrast  Result Date: 04/12/2021 CLINICAL DATA:  Concern for pulmonary embolism. EXAM: CT ANGIOGRAPHY CHEST WITH CONTRAST TECHNIQUE: Multidetector CT imaging of the chest was performed using the standard protocol during bolus administration of intravenous contrast. Multiplanar CT image reconstructions and MIPs were obtained to evaluate the vascular anatomy. RADIATION DOSE REDUCTION: This exam was performed according to the departmental dose-optimization program which includes automated exposure control, adjustment of the mA and/or kV according  to patient size and/or use of iterative reconstruction technique. CONTRAST:  OMNIPAQUE IOHEXOL 350 MG/ML SOLN COMPARISON:  CT dated 11/03/2020 and chest radiograph dated 04/12/2021. FINDINGS: Evaluation of this exam is limited due to respiratory motion artifact. Cardiovascular: Top-normal cardiac size. No pericardial effusion. The thoracic aorta is unremarkable. Evaluation of the pulmonary arteries is very limited due to severe respiratory motion artifact. No large or central pulmonary artery embolus identified. Mediastinum/Nodes: No hilar or mediastinal adenopathy. The esophagus is grossly unremarkable. No mediastinal fluid collection. Lungs/Pleura: Bilateral patchy airspace opacities most concerning for multilobar pneumonia. No pleural effusion pneumothorax. The central airways are patent. Upper Abdomen: Severe fatty liver. Musculoskeletal: No chest wall abnormality. No acute or significant osseous findings. Review of the MIP images confirms the above findings.  IMPRESSION: 1. No CT evidence of central pulmonary artery embolus. 2. Multifocal pneumonia. Clinical correlation and follow-up to resolution recommended. 3. Severe fatty liver. Electronically Signed   By: Elgie Collard M.D.   On: 04/12/2021 02:21   MR BRAIN W WO CONTRAST  Result Date: 04/21/2021 CLINICAL DATA:  Numbness or tingling, paresthesia (Ped 0-17y) tongue paresthesias, lump in throat after covid EXAM: MRI HEAD WITHOUT AND WITH CONTRAST TECHNIQUE: Multiplanar, multiecho pulse sequences of the brain and surrounding structures were obtained without and with intravenous contrast. CONTRAST:  10mL GADAVIST GADOBUTROL 1 MMOL/ML IV SOLN COMPARISON:  12/20/2020 FINDINGS: Brain: There is no acute infarction or intracranial hemorrhage. There is no intracranial mass, mass effect, or edema. There is no hydrocephalus or extra-axial fluid collection. Ventricles and sulci are normal in size and configuration. No abnormal enhancement. Vascular: Major vessel flow voids at the skull base are preserved. Skull and upper cervical spine: Normal marrow signal is preserved. Sinuses/Orbits: Paranasal sinuses are aerated. Orbits are unremarkable. Other: Sella is unremarkable. Mastoid air cells are clear. Prominence of the palatine tonsils. IMPRESSION: No evidence of recent infarction, hemorrhage, or mass. No abnormal enhancement. Prominence of the palatine tonsils. Electronically Signed   By: Guadlupe Spanish M.D.   On: 04/21/2021 17:09   MR CERVICAL SPINE W WO CONTRAST  Result Date: 04/24/2021 EXAM: MRI CERVICAL SPINE WITHOUT AND WITH CONTRAST TECHNIQUE: Multiplanar and multiecho pulse sequences of the cervical spine, to include the craniocervical junction and cervicothoracic junction, were obtained without and with intravenous contrast. CONTRAST:  10mL GADAVIST GADOBUTROL 1 MMOL/ML IV SOLN COMPARISON:  None. FINDINGS: Alignment: Physiologic. Vertebrae: No fracture, evidence of discitis, or bone lesion. Cord: Normal signal  and morphology.  No abnormal enhancement. Posterior Fossa, vertebral arteries, paraspinal tissues: Negative. Disc levels: C2-3: No significant disc bulge. No neural foraminal stenosis. No central canal stenosis. C3-4: No significant disc bulge. No neural foraminal stenosis. No central canal stenosis. C4-5: No significant disc bulge. No neural foraminal stenosis. No central canal stenosis. C5-6: No significant disc bulge. No neural foraminal stenosis. No central canal stenosis. C6-7: No significant disc bulge. No neural foraminal stenosis. No central canal stenosis. C7-T1: No significant disc bulge. No neural foraminal stenosis. No central canal stenosis. IMPRESSION: Normal examination. Electronically Signed   By: Larose Hires D.O.   On: 04/24/2021 17:06   DG Chest Port 1 View  Result Date: 04/12/2021 CLINICAL DATA:  Shortness of breath. EXAM: PORTABLE CHEST 1 VIEW COMPARISON:  Chest radiograph dated 01/02/2021. FINDINGS: Shallow inspiration. There is mild cardiomegaly with mild vascular congestion. Left upper lobe streaky densities may represent vascular congestion. Developing infiltrate is not excluded. Clinical correlation is recommended. No focal consolidation, pleural effusion, or pneumothorax. No acute osseous pathology. IMPRESSION:  1. Mild cardiomegaly with mild vascular congestion. 2. Left upper lobe streaky densities may represent vascular congestion versus developing infiltrate. Electronically Signed   By: Anner Crete M.D.   On: 04/12/2021 00:38   US Abdomen Limited RUQ (LIVER/GB)  Result Date: 04/22/2021 CLINICAL DATA:  Abnormal liver function tests EXAM: ULTRASOUND ABDOMEN LIMITED RIGHT UPPER QUADRANT COMPARISON:  01/03/2021 FINDINGS: Gallbladder: There are hyperechoic foci in the dependent portion of gallbladder suggesting gallbladder stones. There is no definite demonstrable intraluminal mobility in the submitted images. There is no wall thickening. Technologist did not observe any  tenderness over the gallbladder. Common bile duct: Diameter: 3 mm Liver: There is increased echogenicity suggesting fatty infiltration. No focal abnormality is seen in the visualized portions of liver. Portal vein is patent on color Doppler imaging with normal direction of blood flow towards the liver. Other: None. IMPRESSION: Linear hyperechoic focus seen in the posterior margin of gallbladder may suggest layering of small stones or calcification in the gallbladder wall. There are no signs of acute cholecystitis. Fatty liver. Electronically Signed   By: Elmer Picker M.D.   On: 04/22/2021 12:12    Barton Dubois, MD [facility]  If 7PM-7AM, please contact night-coverage [email or web address] Password [facility] 04/27/2021, 5:57 PM   LOS: 6 days

## 2021-04-27 NOTE — TOC Progression Note (Signed)
Transition of Care [facility]) - Progression Note    Patient Details  Name: Carla Little MRN: [id number] Date of Birth: December 24, 1990  Transition of Care [facility] CM/SW Contact  Annice Needy, LCSW Phone Number: 04/27/2021, 1:24 PM  Clinical Narrative:    Eunice Blase at [place] notified that patient will likely d/c 04/28/21.    Expected Discharge Plan: Skilled Nursing Facility Barriers to Discharge: Continued Medical Work up  Expected Discharge Plan and Services Expected Discharge Plan: Skilled Nursing Facility In-house Referral: Clinical Social Work     Living arrangements for the past 2 months: Skilled Nursing Facility                                       Social Determinants of Health (SDOH) Interventions    Readmission Risk Interventions Readmission Risk Prevention Plan 04/17/2021  Transportation Screening Complete  PCP or Specialist Appt within 3-5 Days Complete  HRI or Home Care Consult Complete  Social Work Consult for Recovery Care Planning/Counseling Complete  Palliative Care Screening Not Applicable  Medication Review Oceanographer) Complete  Some recent data might be hidden

## 2021-04-27 NOTE — Progress Notes (Signed)
Subjective:  Slept well for first time in awhile. Appetite good. No melena, brbpr. No abdominal pain. No pruritus. Throat issues gone.   Objective: Vital signs in last 24 hours: Temp:  [98 F (36.7 C)-99.9 F (37.7 C)] 99.9 F (37.7 C) (02/02 1336) Pulse Rate:  [82-95] 93 (02/02 1336) Resp:  [17-20] 18 (02/02 1336) BP: (112-124)/(70-89) 114/70 (02/02 1336) SpO2:  [94 %-100 %] 97 % (02/02 1336) Last BM Date: 04/26/21 General:   Alert,  Well-developed, well-nourished, pleasant and cooperative in NAD Head:  Normocephalic and atraumatic. Eyes:  Sclera clear, no icterus.  Abdomen:  Soft, obese Neurologic:  Alert and  oriented x4 Psych:  Alert and cooperative. Normal mood and affect.  Intake/Output from previous day: 02/01 0701 - 02/02 0700 In: 250 [P.O.:250] Out: 650 [Urine:650] Intake/Output this shift: Total I/O In: 480 [P.O.:480] Out: -   Lab Results: CBC Recent Labs    04/25/21 0533 04/26/21 0531  WBC 12.3* 12.7*  HGB 8.7* 8.7*  HCT 29.8* 29.5*  MCV 92.5 91.6  PLT 263 287   BMET Recent Labs    04/25/21 0533 04/26/21 0531  NA 135 133*  K 3.5 3.7  CL 99 100  CO2 27 26  GLUCOSE 76 68*  BUN 13 9  CREATININE <0.30* <0.30*  CALCIUM 8.4* 8.7*   LFTs Recent Labs    04/25/21 0533 04/26/21 0531 04/27/21 0604  BILITOT 4.6* 3.5* 2.7*  BILIDIR  --   --  1.2*  IBILI  --   --  1.5*  ALKPHOS 83 77 88  AST 434* 313* 378*  ALT 218* 191* 189*  PROT 6.4* 6.9 7.5  ALBUMIN 2.6* 2.6* 2.5*   No results for input(s): LIPASE in the last 72 hours. PT/INR Recent Labs    04/25/21 0533  LABPROT 15.1  INR 1.2      Imaging Studies: CT Soft Tissue Neck W Contrast  Result Date: 04/21/2021 CLINICAL DATA:  Possible throat swelling starting 3 days ago, difficulty swelling EXAM: CT NECK WITH CONTRAST TECHNIQUE: Multidetector CT imaging of the neck was performed using the standard protocol following the bolus administration of intravenous contrast. RADIATION DOSE REDUCTION:  This exam was performed according to the departmental dose-optimization program which includes automated exposure control, adjustment of the mA and/or kV according to patient size and/or use of iterative reconstruction technique. CONTRAST:  59mL OMNIPAQUE IOHEXOL 300 MG/ML  SOLN COMPARISON:  None. FINDINGS: Pharynx and larynx: The nasal cavity and nasopharynx unremarkable. The bilateral palatine tonsils are mildly prominent and partially efface the oropharyngeal airway the without abnormal enhancement or fluid collection. The oral cavity and oropharynx are otherwise unremarkable. The parapharyngeal spaces are clear. The tongue is normal in appearance. The hypopharynx and larynx are unremarkable. The vocal folds are normal in appearance. Salivary glands: The parotid glands are unremarkable. There is fatty atrophy of the submandibular glands. Thyroid: Unremarkable. Lymph nodes: There is a 9 mm left level II lymph node and 7 mm right level II node, nonspecific and not pathologically enlarged by size criteria. There is no pathologic lymphadenopathy in the neck. Vascular: Unremarkable. Limited intracranial: Imaged portions of the intracranial compartment are unremarkable. Visualized orbits: Globes and orbits are unremarkable. Mastoids and visualized paranasal sinuses: The imaged paranasal sinuses are clear. The mastoid air cells are clear. Skeleton: There is no acute osseous abnormality or aggressive osseous lesion. Upper chest: Are patchy opacities in the lung apices, overall slightly improved compared to the CTA chest from 04/12/2021. Other: None. IMPRESSION: 1. Prominent bilateral  palatine tonsils which partially efface the oropharyngeal airway but without abnormal enhancement or mass lesion. No abnormal fluid collection. 2. Subcentimeter bilateral level II lymph nodes, nonspecific and may be reactive. No pathologic lymphadenopathy in the neck. 3. Patchy opacities in the lung apices likely reflecting infection,  improved compared to the CTA chest from 04/12/2021. Electronically Signed   By: Valetta Mole M.D.   On: 04/21/2021 12:04   CT Angio Chest Pulmonary Embolism (PE) W or WO Contrast  Result Date: 04/12/2021 CLINICAL DATA:  Concern for pulmonary embolism. EXAM: CT ANGIOGRAPHY CHEST WITH CONTRAST TECHNIQUE: Multidetector CT imaging of the chest was performed using the standard protocol during bolus administration of intravenous contrast. Multiplanar CT image reconstructions and MIPs were obtained to evaluate the vascular anatomy. RADIATION DOSE REDUCTION: This exam was performed according to the departmental dose-optimization program which includes automated exposure control, adjustment of the mA and/or kV according to patient size and/or use of iterative reconstruction technique. CONTRAST:  117mL OMNIPAQUE IOHEXOL 350 MG/ML SOLN COMPARISON:  CT dated 11/03/2020 and chest radiograph dated 04/12/2021. FINDINGS: Evaluation of this exam is limited due to respiratory motion artifact. Cardiovascular: Top-normal cardiac size. No pericardial effusion. The thoracic aorta is unremarkable. Evaluation of the pulmonary arteries is very limited due to severe respiratory motion artifact. No large or central pulmonary artery embolus identified. Mediastinum/Nodes: No hilar or mediastinal adenopathy. The esophagus is grossly unremarkable. No mediastinal fluid collection. Lungs/Pleura: Bilateral patchy airspace opacities most concerning for multilobar pneumonia. No pleural effusion pneumothorax. The central airways are patent. Upper Abdomen: Severe fatty liver. Musculoskeletal: No chest wall abnormality. No acute or significant osseous findings. Review of the MIP images confirms the above findings. IMPRESSION: 1. No CT evidence of central pulmonary artery embolus. 2. Multifocal pneumonia. Clinical correlation and follow-up to resolution recommended. 3. Severe fatty liver. Electronically Signed   By: Anner Crete M.D.   On:  04/12/2021 02:21   MR BRAIN W WO CONTRAST  Result Date: 04/21/2021 CLINICAL DATA:  Numbness or tingling, paresthesia (Ped 0-17y) tongue paresthesias, lump in throat after covid EXAM: MRI HEAD WITHOUT AND WITH CONTRAST TECHNIQUE: Multiplanar, multiecho pulse sequences of the brain and surrounding structures were obtained without and with intravenous contrast. CONTRAST:  41mL GADAVIST GADOBUTROL 1 MMOL/ML IV SOLN COMPARISON:  12/20/2020 FINDINGS: Brain: There is no acute infarction or intracranial hemorrhage. There is no intracranial mass, mass effect, or edema. There is no hydrocephalus or extra-axial fluid collection. Ventricles and sulci are normal in size and configuration. No abnormal enhancement. Vascular: Major vessel flow voids at the skull base are preserved. Skull and upper cervical spine: Normal marrow signal is preserved. Sinuses/Orbits: Paranasal sinuses are aerated. Orbits are unremarkable. Other: Sella is unremarkable. Mastoid air cells are clear. Prominence of the palatine tonsils. IMPRESSION: No evidence of recent infarction, hemorrhage, or mass. No abnormal enhancement. Prominence of the palatine tonsils. Electronically Signed   By: Macy Mis M.D.   On: 04/21/2021 17:09   MR CERVICAL SPINE W WO CONTRAST  Result Date: 04/24/2021 EXAM: MRI CERVICAL SPINE WITHOUT AND WITH CONTRAST TECHNIQUE: Multiplanar and multiecho pulse sequences of the cervical spine, to include the craniocervical junction and cervicothoracic junction, were obtained without and with intravenous contrast. CONTRAST:  31mL GADAVIST GADOBUTROL 1 MMOL/ML IV SOLN COMPARISON:  None. FINDINGS: Alignment: Physiologic. Vertebrae: No fracture, evidence of discitis, or bone lesion. Cord: Normal signal and morphology.  No abnormal enhancement. Posterior Fossa, vertebral arteries, paraspinal tissues: Negative. Disc levels: C2-3: No significant disc bulge. No neural foraminal stenosis.  No central canal stenosis. C3-4: No significant  disc bulge. No neural foraminal stenosis. No central canal stenosis. C4-5: No significant disc bulge. No neural foraminal stenosis. No central canal stenosis. C5-6: No significant disc bulge. No neural foraminal stenosis. No central canal stenosis. C6-7: No significant disc bulge. No neural foraminal stenosis. No central canal stenosis. C7-T1: No significant disc bulge. No neural foraminal stenosis. No central canal stenosis. IMPRESSION: Normal examination. Electronically Signed   By: Keane Police D.O.   On: 04/24/2021 17:06   DG Chest Port 1 View  Result Date: 04/12/2021 CLINICAL DATA:  Shortness of breath. EXAM: PORTABLE CHEST 1 VIEW COMPARISON:  Chest radiograph dated 01/02/2021. FINDINGS: Shallow inspiration. There is mild cardiomegaly with mild vascular congestion. Left upper lobe streaky densities may represent vascular congestion. Developing infiltrate is not excluded. Clinical correlation is recommended. No focal consolidation, pleural effusion, or pneumothorax. No acute osseous pathology. IMPRESSION: 1. Mild cardiomegaly with mild vascular congestion. 2. Left upper lobe streaky densities may represent vascular congestion versus developing infiltrate. Electronically Signed   By: Anner Crete M.D.   On: 04/12/2021 00:38   US Abdomen Limited RUQ (LIVER/GB)  Result Date: 04/22/2021 CLINICAL DATA:  Abnormal liver function tests EXAM: ULTRASOUND ABDOMEN LIMITED RIGHT UPPER QUADRANT COMPARISON:  01/03/2021 FINDINGS: Gallbladder: There are hyperechoic foci in the dependent portion of gallbladder suggesting gallbladder stones. There is no definite demonstrable intraluminal mobility in the submitted images. There is no wall thickening. Technologist did not observe any tenderness over the gallbladder. Common bile duct: Diameter: 3 mm Liver: There is increased echogenicity suggesting fatty infiltration. No focal abnormality is seen in the visualized portions of liver. Portal vein is patent on color Doppler  imaging with normal direction of blood flow towards the liver. Other: None. IMPRESSION: Linear hyperechoic focus seen in the posterior margin of gallbladder may suggest layering of small stones or calcification in the gallbladder wall. There are no signs of acute cholecystitis. Fatty liver. Electronically Signed   By: Elmer Picker M.D.   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  She has been residing at [facility] since November 2022.  Hospitalized January 17 through January 23 due to [place].  Treated previously with 5 days of remdesivir.  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  Elevated transaminases: Noted since September 2022 when diagnosed with GBS, though transaminases peaked during this admission, now trending downward.  AST always significantly more elevated than ALT.  Fractionated bilirubin on January 28 with direct greater than indirect.  Ultrasound with fatty liver.  Hepatitis C antibody negative, HCVRNA negative, hepatitis B surface antigen negative, CMV negative, immunoglobulins normal, ceruloplasmin low at 17, alpha-1 antitrypsin 106, CRP 4.5, ferritin 135, ANA negative, ASMA negative, AMA negative, EBV DNA negative. Serum copper and zinc pending but I do note serum copper level was normal last fall. Patient denies alcohol or herbal supplements.  No family history of liver disease.  Differentials including drug-induced hepatic injury, autoimmune hepatitis, Wilson's disease, or secondary to COVID.  Today her total bilirubin is 2.7, direct bilirubin 1.2, indirect bilirubin 1.5, alkaline phosphatase 88, AST 378, ALT 189.  Overall numbers are improved from over the past 72 hours from peaks (AST 626, ALT 252, total bilirubin 5.3).  Anemia:  Hemoglobin 8.7 with MCV of 91.6, ferritin 115, not consistent with  IDA, appears baseline hemoglobin may be around 10.  Last documented normal H/H 2020 and then next Hgb in 10/2020 was 8.9. ?anemia of chronic disease. ?related to heavy menstruation in the past.  She does not take iron supplements.  Denies melena or rectal bleeding.  EGD and flexible sigmoidoscopy done in October 2022 (prep poor, internal hemorrhoids) had LA grade B reflux esophagitis, gastric ulcer on EGD in August 2022 secondary to heavy NSAID use, however repeat EGD in October was without any gastric lesions, she has not used NSAIDs since peptic ulcer disease diagnosis.  Plan: Follow-up serum zinc level. Discussed with Dr. Abbey Chatters, would move forward with 24 hour urinary copper collection (serum copper was normal several months ago). Would be most easily collected while inpatient.  Peripheral smear to rule out hemolysis. LFTs tomorrow.  Laureen Ochs. Bernarda Caffey [facility] [phone] 2/2/20233:36 PM    LOS: 6 days

## 2021-04-28 DIAGNOSIS — K219 Gastro-esophageal reflux disease without esophagitis: Secondary | ICD-10-CM

## 2021-04-28 DIAGNOSIS — K7581 Nonalcoholic steatohepatitis (NASH): Secondary | ICD-10-CM

## 2021-04-28 DIAGNOSIS — J1282 Pneumonia due to coronavirus disease 2019: Secondary | ICD-10-CM

## 2021-04-28 DIAGNOSIS — R7989 Other specified abnormal findings of blood chemistry: Secondary | ICD-10-CM

## 2021-04-28 DIAGNOSIS — U071 COVID-19: Secondary | ICD-10-CM

## 2021-04-28 LAB — HEPATIC FUNCTION PANEL
ALT: 225 U/L — ABNORMAL HIGH (ref 0–44)
AST: 493 U/L — ABNORMAL HIGH (ref 15–41)
Albumin: 2.5 g/dL — ABNORMAL LOW (ref 3.5–5.0)
Alkaline Phosphatase: 99 U/L (ref 38–126)
Bilirubin, Direct: 1.2 mg/dL — ABNORMAL HIGH (ref 0.0–0.2)
Indirect Bilirubin: 1.8 mg/dL — ABNORMAL HIGH (ref 0.3–0.9)
Total Bilirubin: 3 mg/dL — ABNORMAL HIGH (ref 0.3–1.2)
Total Protein: 7.9 g/dL (ref 6.5–8.1)

## 2021-04-28 LAB — ACETYLCHOLINE RECEPTOR AB, ALL
Acety choline binding ab: 0.06 nmol/L (ref 0.00–0.24)
Acetylchol Block Ab: 16 % (ref 0–25)

## 2021-04-28 LAB — FERRITIN: Ferritin: 128 ng/mL (ref 11–307)

## 2021-04-28 LAB — C-REACTIVE PROTEIN: CRP: 3.5 mg/dL — ABNORMAL HIGH (ref <1.0)

## 2021-04-28 LAB — PATHOLOGIST SMEAR REVIEW

## 2021-04-28 LAB — COPPER, SERUM: Copper: 85 ug/dL (ref 80–158)

## 2021-04-28 LAB — GLUCOSE, CAPILLARY
Glucose-Capillary: 78 mg/dL (ref 70–99)
Glucose-Capillary: 110 mg/dL — ABNORMAL HIGH (ref 70–99)
Glucose-Capillary: 121 mg/dL — ABNORMAL HIGH (ref 70–99)

## 2021-04-28 LAB — ZINC: Zinc: 64 ug/dL (ref 44–115)

## 2021-04-28 MED ORDER — PREDNISONE 10 MG PO TABS: 10.0000 mg | ORAL_TABLET | Freq: Every day | ORAL | Status: DC

## 2021-04-28 MED ORDER — HYDROXYZINE HCL 25 MG PO TABS: 25.0000 mg | ORAL_TABLET | Freq: Three times a day (TID) | ORAL | 0 refills | Status: DC | PRN

## 2021-04-28 MED ORDER — PANTOPRAZOLE SODIUM 40 MG PO TBEC: 40.0000 mg | DELAYED_RELEASE_TABLET | Freq: Two times a day (BID) | ORAL | 2 refills | Status: DC

## 2021-04-28 MED ORDER — ONDANSETRON 4 MG PO TBDP: 4.0000 mg | ORAL_TABLET | Freq: Three times a day (TID) | ORAL | 0 refills | Status: DC | PRN

## 2021-04-28 MED ORDER — NYSTATIN 100000 UNIT/ML MT SUSP: 5.0000 mL | Freq: Four times a day (QID) | OROMUCOSAL | 0 refills | Status: AC

## 2021-04-28 MED ORDER — OXYCODONE HCL 5 MG PO TABS: 5.0000 mg | ORAL_TABLET | Freq: Two times a day (BID) | ORAL | 0 refills | Status: DC | PRN

## 2021-04-28 MED ORDER — CYANOCOBALAMIN 500 MCG PO TABS: 500.0000 ug | ORAL_TABLET | Freq: Every day | ORAL | 2 refills | Status: DC

## 2021-04-28 NOTE — Plan of Care (Signed)
Problem: Education: Goal: Knowledge of General Education information will improve Description: Including pain rating scale, medication(s)/side effects and non-pharmacologic comfort measures Outcome: Completed/Met   Problem: Health Behavior/Discharge Planning: Goal: Ability to manage health-related needs will improve Outcome: Completed/Met   Problem: Clinical Measurements: Goal: Ability to maintain clinical measurements within normal limits will improve Outcome: Completed/Met Goal: Will remain free from infection Outcome: Completed/Met Goal: Diagnostic test results will improve Outcome: Completed/Met Goal: Respiratory complications will improve Outcome: Completed/Met Goal: Cardiovascular complication will be avoided Outcome: Completed/Met   Problem: Nutrition: Goal: Adequate nutrition will be maintained Outcome: Completed/Met   Problem: Coping: Goal: Level of anxiety will decrease Outcome: Completed/Met   Problem: Elimination: Goal: Will not experience complications related to bowel motility Outcome: Completed/Met Goal: Will not experience complications related to urinary retention Outcome: Completed/Met   Problem: Pain Managment: Goal: General experience of comfort will improve Outcome: Completed/Met   Problem: Safety: Goal: Ability to remain free from injury will improve Outcome: Completed/Met   Problem: Skin Integrity: Goal: Risk for impaired skin integrity will decrease Outcome: Completed/Met   

## 2021-04-28 NOTE — Discharge Planning (Signed)
Physician Discharge Summary  Carla Little [id number] DOB: 04-15-1990 DOA: 04/21/2021  PCP: Patient, No Pcp Per (Inactive)  Admit date: 04/21/2021 Discharge date: 04/28/2021  Time spent: 35 minutes  Recommendations for Outpatient Follow-up:  Repeat complete metabolic panel to follow electrolytes, renal function and LFTs. Repeat CBC to follow WBCs count and hemoglobin and stability. Outpatient follow-up with gastroenterology service (office will contact with appointment details).  Discharge Diagnoses:  Principal Problem:   AIDP (acute inflammatory demyelinating polyneuropathy) (HCC) Active Problems:   Nonalcoholic steatohepatitis (NASH)   Pneumonia due to COVID-19 virus   Obesity, morbid, BMI 50 or higher (HCC)   Transaminasemia   Anemia   Elevated LFTs   Discharge Condition: Stable and improved.  Discharged back to skilled nursing facility for further care and rehabilitation.  CODE STATUS: Full code  Diet recommendation: Heart healthy/low calorie diet.  Filed Weights   04/21/21 1000  Weight: (!) 170.4 kg    History of present illness:  31 y.o. 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  Notably, patient had a hospitalization from 12/09/2020 to 01/26/2021 during which time she was treated for AIDP when she received 5 doses of IVIG.  She was subsequently discharged to SNF.  She stated that her strength gradually improved.  She continued to have dysesthesias in her arms and legs.  However, was readmitted to the hospital from 04/11/2021 to 04/17/2021 when she was treated for acute respiratory failure secondary to COVID-19 pneumonia.  During that admission, the patient received 5 days of remdesivir and steroids.  She was discharged home with prednisone which she finished on 04/16/2021.  On the next day, she began having the  throat closing up sensation.  She denies any frank sore throat or dysphagia or odynophagia.  She denies any fevers, chills, chest pain, worsening shortness of breath, nausea, vomiting, diarrhea, abdominal pain.  She states that her extremity weakness has been improving up until the point where she contracted COVID-19 pneumonia.  Since then, she feels that her extremities have been weaker.  She states that her dysesthesias have been about the same since her COVID-19 pneumonia.  The patient states that she has been able to swallow pills and eat solid food without much difficulty.  There is no dysphagia or odynophagia.  She states that her face and neck have not been swollen.  She states that the Tylenol has not been swollen, but she began having numbness and tingling in her tongue on 04/20/2021. In the ED, patient was afebrile hemodynamically stable with oxygen saturation 92% on room air.  BMP showed a sodium 137, potassium 3.5, CO2 28, serum creatinine 0.31.  AST 259, ALT 127, phosphatase 89, total bilirubin 5.3.  WBC 14.8, hemoglobin 10.9, platelets 3 98,000.  UA was negative for pyuria.  CT of the neck shows mildly prominent palatine tonsils with partial effaced oral pharyngeal airway without any abnormal fluid collection or abnormal enhancement.  The parapharyngeal spaces were clear, and tongue was normal.  There was normal salivary glands.  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 -1/31--pt states tongue still numb but throat closing sensation continues to improve -NIF neg 12; IS 1200 cc>>1445,  NIF neg 40>>>continue each shift -04/24/21--Case discussed with Dr. Hortense Ramal (neurology service)>> patient completed 5 days of IVIG infusion.   COVID-19 pneumonia -Patient was previously treated with 5 days of remdesivir -Solu-Medrol 1000 mg daily x3 days--finished on 1/29 -CRP--13.4>>6.8>>3.8>>4.1 -ferritin--115>>84>115>>156 -PCT 0.13 -No requiring oxygen supplementation.   Tranasminasemia -Suspect related to the patient's COVID-19 infection vs autoimmune -Repeat right upper quadrant ultrasound -Fractionated bilirubin--mostly direct -Hepatitis B surface antigen--neg -Hepatitis C antibody--neg -Continue to trend -EBV DNA--negative -CMV DNA--neg -discontinue depakote given elevated LFTs. -Haptoglobin 167 (normal) -tolerating diet without abd pain -LFTs continue trending down; continue to follow levels. -Continue supportive care and treatment with IVIG. -Continue to follow with GI service as an outpatient; copper in urine collection completed and final results pending at discharge. -Ceruloplasmin level 17.   Depression/anxiety -Continue Wellbutrin and BuSpar -Patient's Depakote has been discontinue due to elevated LFTs. -Overall mood is a stable; no suicidal ideation or hallucinations.   Constipation -Continue the use of Linzess.   Morbid obesity -BMI 64.49 -Low calorie diet and portion control discussed with patient.   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   Stage 2 gluteal decubitus -present on admission -continue local care, constant repositioning and preventive measures.   Hypokalemia -Repleted -Magnesium within normal limits -Repeat basic metabolic panel to assess electrolytes stability in 1 week. -Potassium 3.7 at time of discharge.     Chronic leg pain -Continue the use of as needed oxycodone. -Avoid the use of NSAIDs.  Procedures: See below for x-ray reports.  Consultations: Gastroenterology service Neurology service.  Discharge  Exam: Vitals:   04/28/21 0838 04/28/21 1353  BP: 115/63 115/77  Pulse: (!) 109 99  Resp:  18  Temp:  98.4 F (36.9 C)  SpO2: 96% 98%   General exam: Alert, awake, oriented x 3, reports improvement in her leg pain with current analgesic regimen; no nausea, no vomiting, no overt bleeding.  Patient is afebrile. Respiratory system: Good air movement bilaterally.Marland Kitchen Respiratory effort normal.  Good saturation on room air.  No wheezing on exam. Cardiovascular system:RRR. No murmurs, rubs, gallops.  Unable to properly assess JVD with body habitus. Gastrointestinal system: Abdomen is obese, nondistended, soft and nontender. No organomegaly or masses felt. Normal bowel sounds heard. Central nervous system: Alert and oriented. No focal neurological deficits. Extremities: No cyanosis or clubbing. Skin: No petechiae.  Stage II decubitus gluteal pressure injury present at time of admission without signs of superimposed infection. Psychiatry: Judgement and insight appear normal. Mood & affect appropriate.    Discharge Instructions   Discharge Instructions     Diet - low sodium heart healthy   Complete by: As directed    Discharge instructions   Complete by: As directed    Follow low-sodium diet Maintain adequate hydration Take medications as prescribed Follow-up with PCP in 10 days and follow-up with gastroenterology service as instructed. Avoid the use of NSAIDs   Discharge wound care:   Complete by: As directed    Keep area clean and dry; provide close follow on for skin healing process and make sure patient received constant repositioning.      Allergies as of 04/28/2021       Reactions   Azithromycin Shortness Of Breath, Nausea And Vomiting        Medication List     STOP taking these medications    ciprofloxacin  500 MG tablet Commonly known as: Cipro   diphenhydrAMINE 25 MG tablet Commonly known as: BENADRYL   divalproex 125 MG capsule Commonly known as: DEPAKOTE  SPRINKLE   lidocaine 5 % Commonly known as: LIDODERM   omeprazole 20 MG capsule Commonly known as: PRILOSEC Replaced by: pantoprazole 40 MG tablet   phenazopyridine 100 MG tablet Commonly known as: PYRIDIUM       TAKE these medications    acetaminophen 325 MG tablet Commonly known as: TYLENOL Take 2 tablets (650 mg total) by mouth every 6 (six) hours as needed for mild pain (or Fever >/= 101).   albuterol 108 (90 Base) MCG/ACT inhaler Commonly known as: VENTOLIN HFA Inhale 1 puff into the lungs every 6 (six) hours as needed for wheezing or shortness of breath.   ascorbic acid 500 MG tablet Commonly known as: VITAMIN C Take 500 mg by mouth daily.   buPROPion 150 MG 12 hr tablet Commonly known as: WELLBUTRIN SR Take 150 mg by mouth daily.   busPIRone 5 MG tablet Commonly known as: BUSPAR Take 1 tablet (5 mg total) by mouth 2 (two) times daily.   cyanocobalamin 1000 MCG/ML injection Commonly known as: (VITAMIN B-12) Inject 1 mL (1,000 mcg total) into the muscle every 30 (thirty) days. What changed: Another medication with the same name was added. Make sure you understand how and when to take each.   vitamin B-12 500 MCG tablet Commonly known as: CYANOCOBALAMIN Take 1 tablet (500 mcg total) by mouth daily. Start taking on: April 29, 2021 What changed: You were already taking a medication with the same name, and this prescription was added. Make sure you understand how and when to take each.   folic acid 1 MG tablet Commonly known as: FOLVITE Take 1 tablet (1 mg total) by mouth daily.   Gerhardt's butt cream Crea Apply 1 application topically as needed for irritation.   guaiFENesin-dextromethorphan 100-10 MG/5ML syrup Commonly known as: ROBITUSSIN DM Take 10 mLs by mouth every 4 (four) hours as needed for cough.   hydrocortisone 25 MG suppository Commonly known as: ANUSOL-HC Place 1 suppository (25 mg total) rectally 2 (two) times daily.   hydrOXYzine 25 MG  tablet Commonly known as: ATARAX Take 1 tablet (25 mg total) by mouth every 8 (eight) hours as needed for anxiety.   linaclotide 290 MCG Caps capsule Commonly known as: LINZESS Take 1 capsule (290 mcg total) by mouth daily before breakfast.   melatonin 5 MG Tabs Take 10 mg by mouth at bedtime.   methocarbamol 500 MG tablet Commonly known as: ROBAXIN Take 1 tablet (500 mg total) by mouth every 6 (six) hours as needed for muscle spasms.   metoprolol tartrate 25 MG tablet Commonly known as: LOPRESSOR Take 0.5 tablets (12.5 mg total) by mouth 2 (two) times daily.   nystatin 100000 UNIT/ML suspension Commonly known as: MYCOSTATIN Take 5 mLs (500,000 Units total) by mouth 4 (four) times daily for 5 days.   ondansetron 4 MG disintegrating tablet Commonly known as: ZOFRAN-ODT Take 1 tablet (4 mg total) by mouth every 8 (eight) hours as needed for nausea or vomiting.   oxyCODONE 5 MG immediate release tablet Commonly known as: Oxy IR/ROXICODONE Take 1 tablet (5 mg total) by mouth every 12 (twelve) hours as needed for severe pain. What changed: when to take this   pantoprazole 40 MG tablet Commonly known as: PROTONIX Take 1 tablet (40 mg total) by mouth 2 (two) times daily. Replaces: omeprazole 20 MG capsule  polyethylene glycol 17 g packet Commonly known as: MIRALAX / GLYCOLAX Take 17 g by mouth 2 (two) times daily.   predniSONE 10 MG tablet Commonly known as: DELTASONE Take 1 tablet (10 mg total) by mouth daily with breakfast. Start taking on: April 29, 2021 What changed:  how much to take how to take this when to take this additional instructions   pregabalin 75 MG capsule Commonly known as: LYRICA Take 1 capsule (75 mg total) by mouth 3 (three) times daily.   senna-docusate 8.6-50 MG tablet Commonly known as: Senokot-S Take 1 tablet by mouth at bedtime as needed for mild constipation.   Vitamin D3 25 MCG tablet Commonly known as: Vitamin D Take 2 tablets  (2,000 Units total) by mouth daily. What changed: how much to take   Zinc 220 (50 Zn) MG Caps Take 1 capsule by mouth daily at 6 (six) AM. For 14 days due to covid               Discharge Care Instructions  (From admission, onward)           Start     Ordered   04/28/21 0000  Discharge wound care:       Comments: Keep area clean and dry; provide close follow on for skin healing process and make sure patient received constant repositioning.   04/28/21 1531           Allergies  Allergen Reactions   Azithromycin Shortness Of Breath and Nausea And Vomiting    Contact information for after-discharge care     [facility] .   Service: Skilled Nursing Contact information: [address] [place] [phone]                     The results of significant diagnostics from this hospitalization (including imaging, microbiology, ancillary and laboratory) are listed below for reference.    Significant Diagnostic Studies: CT Soft Tissue Neck W Contrast  Result Date: 04/21/2021 CLINICAL DATA:  Possible throat swelling starting 3 days ago, difficulty swelling EXAM: CT NECK WITH CONTRAST TECHNIQUE: Multidetector CT imaging of the neck was performed using the standard protocol following the bolus administration of intravenous contrast. RADIATION DOSE REDUCTION: This exam was performed according to the departmental dose-optimization program which includes automated exposure control, adjustment of the mA and/or kV according to patient size and/or use of iterative reconstruction technique. CONTRAST:  1mL OMNIPAQUE IOHEXOL 300 MG/ML  SOLN COMPARISON:  None. FINDINGS: Pharynx and larynx: The nasal cavity and nasopharynx unremarkable. The bilateral palatine tonsils are mildly prominent and partially efface the oropharyngeal airway the without abnormal enhancement or fluid collection. The oral cavity and  oropharynx are otherwise unremarkable. The parapharyngeal spaces are clear. The tongue is normal in appearance. The hypopharynx and larynx are unremarkable. The vocal folds are normal in appearance. Salivary glands: The parotid glands are unremarkable. There is fatty atrophy of the submandibular glands. Thyroid: Unremarkable. Lymph nodes: There is a 9 mm left level II lymph node and 7 mm right level II node, nonspecific and not pathologically enlarged by size criteria. There is no pathologic lymphadenopathy in the neck. Vascular: Unremarkable. Limited intracranial: Imaged portions of the intracranial compartment are unremarkable. Visualized orbits: Globes and orbits are unremarkable. Mastoids and visualized paranasal sinuses: The imaged paranasal sinuses are clear. The mastoid air cells are clear. Skeleton: There is no acute osseous abnormality or aggressive osseous lesion. Upper chest:  Are patchy opacities in the lung apices, overall slightly improved compared to the CTA chest from 04/12/2021. Other: None. IMPRESSION: 1. Prominent bilateral palatine tonsils which partially efface the oropharyngeal airway but without abnormal enhancement or mass lesion. No abnormal fluid collection. 2. Subcentimeter bilateral level II lymph nodes, nonspecific and may be reactive. No pathologic lymphadenopathy in the neck. 3. Patchy opacities in the lung apices likely reflecting infection, improved compared to the CTA chest from 04/12/2021. Electronically Signed   By: Valetta Mole M.D.   On: 04/21/2021 12:04   CT Angio Chest Pulmonary Embolism (PE) W or WO Contrast  Result Date: 04/12/2021 CLINICAL DATA:  Concern for pulmonary embolism. EXAM: CT ANGIOGRAPHY CHEST WITH CONTRAST TECHNIQUE: Multidetector CT imaging of the chest was performed using the standard protocol during bolus administration of intravenous contrast. Multiplanar CT image reconstructions and MIPs were obtained to evaluate the vascular anatomy. RADIATION DOSE  REDUCTION: This exam was performed according to the departmental dose-optimization program which includes automated exposure control, adjustment of the mA and/or kV according to patient size and/or use of iterative reconstruction technique. CONTRAST:  130mL OMNIPAQUE IOHEXOL 350 MG/ML SOLN COMPARISON:  CT dated 11/03/2020 and chest radiograph dated 04/12/2021. FINDINGS: Evaluation of this exam is limited due to respiratory motion artifact. Cardiovascular: Top-normal cardiac size. No pericardial effusion. The thoracic aorta is unremarkable. Evaluation of the pulmonary arteries is very limited due to severe respiratory motion artifact. No large or central pulmonary artery embolus identified. Mediastinum/Nodes: No hilar or mediastinal adenopathy. The esophagus is grossly unremarkable. No mediastinal fluid collection. Lungs/Pleura: Bilateral patchy airspace opacities most concerning for multilobar pneumonia. No pleural effusion pneumothorax. The central airways are patent. Upper Abdomen: Severe fatty liver. Musculoskeletal: No chest wall abnormality. No acute or significant osseous findings. Review of the MIP images confirms the above findings. IMPRESSION: 1. No CT evidence of central pulmonary artery embolus. 2. Multifocal pneumonia. Clinical correlation and follow-up to resolution recommended. 3. Severe fatty liver. Electronically Signed   By: Anner Crete M.D.   On: 04/12/2021 02:21   MR BRAIN W WO CONTRAST  Result Date: 04/21/2021 CLINICAL DATA:  Numbness or tingling, paresthesia (Ped 0-17y) tongue paresthesias, lump in throat after covid EXAM: MRI HEAD WITHOUT AND WITH CONTRAST TECHNIQUE: Multiplanar, multiecho pulse sequences of the brain and surrounding structures were obtained without and with intravenous contrast. CONTRAST:  61mL GADAVIST GADOBUTROL 1 MMOL/ML IV SOLN COMPARISON:  12/20/2020 FINDINGS: Brain: There is no acute infarction or intracranial hemorrhage. There is no intracranial mass, mass  effect, or edema. There is no hydrocephalus or extra-axial fluid collection. Ventricles and sulci are normal in size and configuration. No abnormal enhancement. Vascular: Major vessel flow voids at the skull base are preserved. Skull and upper cervical spine: Normal marrow signal is preserved. Sinuses/Orbits: Paranasal sinuses are aerated. Orbits are unremarkable. Other: Sella is unremarkable. Mastoid air cells are clear. Prominence of the palatine tonsils. IMPRESSION: No evidence of recent infarction, hemorrhage, or mass. No abnormal enhancement. Prominence of the palatine tonsils. Electronically Signed   By: Macy Mis M.D.   On: 04/21/2021 17:09   MR CERVICAL SPINE W WO CONTRAST  Result Date: 04/24/2021 EXAM: MRI CERVICAL SPINE WITHOUT AND WITH CONTRAST TECHNIQUE: Multiplanar and multiecho pulse sequences of the cervical spine, to include the craniocervical junction and cervicothoracic junction, were obtained without and with intravenous contrast. CONTRAST:  55mL GADAVIST GADOBUTROL 1 MMOL/ML IV SOLN COMPARISON:  None. FINDINGS: Alignment: Physiologic. Vertebrae: No fracture, evidence of discitis, or bone lesion. Cord: Normal signal and  morphology.  No abnormal enhancement. Posterior Fossa, vertebral arteries, paraspinal tissues: Negative. Disc levels: C2-3: No significant disc bulge. No neural foraminal stenosis. No central canal stenosis. C3-4: No significant disc bulge. No neural foraminal stenosis. No central canal stenosis. C4-5: No significant disc bulge. No neural foraminal stenosis. No central canal stenosis. C5-6: No significant disc bulge. No neural foraminal stenosis. No central canal stenosis. C6-7: No significant disc bulge. No neural foraminal stenosis. No central canal stenosis. C7-T1: No significant disc bulge. No neural foraminal stenosis. No central canal stenosis. IMPRESSION: Normal examination. Electronically Signed   By: Keane Police D.O.   On: 04/24/2021 17:06   DG Chest Port 1  View  Result Date: 04/12/2021 CLINICAL DATA:  Shortness of breath. EXAM: PORTABLE CHEST 1 VIEW COMPARISON:  Chest radiograph dated 01/02/2021. FINDINGS: Shallow inspiration. There is mild cardiomegaly with mild vascular congestion. Left upper lobe streaky densities may represent vascular congestion. Developing infiltrate is not excluded. Clinical correlation is recommended. No focal consolidation, pleural effusion, or pneumothorax. No acute osseous pathology. IMPRESSION: 1. Mild cardiomegaly with mild vascular congestion. 2. Left upper lobe streaky densities may represent vascular congestion versus developing infiltrate. Electronically Signed   By: Anner Crete M.D.   On: 04/12/2021 00:38   US Abdomen Limited RUQ (LIVER/GB)  Result Date: 04/22/2021 CLINICAL DATA:  Abnormal liver function tests EXAM: ULTRASOUND ABDOMEN LIMITED RIGHT UPPER QUADRANT COMPARISON:  01/03/2021 FINDINGS: Gallbladder: There are hyperechoic foci in the dependent portion of gallbladder suggesting gallbladder stones. There is no definite demonstrable intraluminal mobility in the submitted images. There is no wall thickening. Technologist did not observe any tenderness over the gallbladder. Common bile duct: Diameter: 3 mm Liver: There is increased echogenicity suggesting fatty infiltration. No focal abnormality is seen in the visualized portions of liver. Portal vein is patent on color Doppler imaging with normal direction of blood flow towards the liver. Other: None. IMPRESSION: Linear hyperechoic focus seen in the posterior margin of gallbladder may suggest layering of small stones or calcification in the gallbladder wall. There are no signs of acute cholecystitis. Fatty liver. Electronically Signed   By: Elmer Picker M.D.   On: 04/22/2021 12:12    Microbiology: Recent Results (from the past 240 hour(s))  Resp Panel by RT-PCR (Flu A&B, Covid) Nasopharyngeal Swab     Status: Abnormal   Collection Time: 04/21/21  2:13 PM    Specimen: Nasopharyngeal Swab; Nasopharyngeal(NP) swabs in vial transport medium  Result Value Ref Range Status   SARS Coronavirus 2 by RT PCR POSITIVE (A) NEGATIVE Final    Comment: (NOTE) SARS-CoV-2 target nucleic acids are DETECTED.  The SARS-CoV-2 RNA is generally detectable in upper respiratory specimens during the acute phase of infection. Positive results are indicative of the presence of the identified virus, but do not rule out bacterial infection or co-infection with other pathogens not detected by the test. Clinical correlation with patient history and other diagnostic information is necessary to determine patient infection status. The expected result is Negative.  Fact Sheet for Patients: EntrepreneurPulse.com.au  Fact Sheet for Healthcare Providers: IncredibleEmployment.be  This test is not yet approved or cleared by the [place] FDA and  has been authorized for detection and/or diagnosis of SARS-CoV-2 by FDA under an Emergency Use Authorization (EUA).  This EUA will remain in effect (meaning this test can be used) for the duration of  the COVID-19 declaration under Section 564(b)(1) of the A ct, 21 U.S.C. section 360bbb-3(b)(1), unless the authorization is terminated or revoked sooner.  Influenza A by PCR NEGATIVE NEGATIVE Final   Influenza B by PCR NEGATIVE NEGATIVE Final    Comment: (NOTE) The Xpert Xpress SARS-CoV-2/FLU/RSV plus assay is intended as an aid in the diagnosis of influenza from Nasopharyngeal swab specimens and should not be used as a sole basis for treatment. Nasal washings and aspirates are unacceptable for Xpert Xpress SARS-CoV-2/FLU/RSV testing.  Fact Sheet for Patients: EntrepreneurPulse.com.au  Fact Sheet for Healthcare Providers: IncredibleEmployment.be  This test is not yet approved or cleared by the [place] FDA and has been authorized for detection  and/or diagnosis of SARS-CoV-2 by FDA under an Emergency Use Authorization (EUA). This EUA will remain in effect (meaning this test can be used) for the duration of the COVID-19 declaration under Section 564(b)(1) of the Act, 21 U.S.C. section 360bbb-3(b)(1), unless the authorization is terminated or revoked.  Performed at [facility], [address], [place], [place] [address]      Labs: Basic Metabolic Panel: Recent Labs  Lab 04/22/21 0458 04/23/21 0556 04/24/21 0430 04/25/21 0533 04/26/21 0531  NA 139 140 138 135 133*  K 3.5 3.3* 3.4* 3.5 3.7  CL 100 102 99 99 100  CO2 26 27 28 27 26   GLUCOSE 137* 121* 96 76 68*  BUN 7 15 16 13 9   CREATININE <0.30* 0.33* 0.34* <0.30* <0.30*  CALCIUM 9.1 8.9 9.0 8.4* 8.7*  MG 1.8  --   --   --  1.8   Liver Function Tests: Recent Labs  Lab 04/24/21 0430 04/25/21 0533 04/26/21 0531 04/27/21 0604 04/28/21 0524  AST 626* 434* 313* 378* 493*  ALT 252* 218* 191* 189* 225*  ALKPHOS 106 83 77 88 99  BILITOT 3.8* 4.6* 3.5* 2.7* 3.0*  PROT 6.7 6.4* 6.9 7.5 7.9  ALBUMIN 3.0* 2.6* 2.6* 2.5* 2.5*   CBC: Recent Labs  Lab 04/22/21 0458 04/23/21 0556 04/24/21 0430 04/25/21 0533 04/26/21 0531  WBC 10.0 10.5 11.3* 12.3* 12.7*  NEUTROABS  --   --   --   --  8.3*  HGB 10.2* 10.0* 9.4* 8.7* 8.7*  HCT 32.4* 31.6* 30.5* 29.8* 29.5*  MCV 85.9 85.6 87.1 92.5 91.6  PLT 349 367 325 263 287   CBG: Recent Labs  Lab 04/27/21 1124 04/27/21 1609 04/27/21 2137 04/28/21 0753 04/28/21 1114  GLUCAP 108* 142* 114* 78 110*   Signed:  Barton Dubois MD.  [facility] 04/28/2021, 3:33 PM

## 2021-04-28 NOTE — Progress Notes (Signed)
Subjective:  Patient feels better.  She says her appetite is back to normal.  She does not have any difficulty swallowing solids but does have some difficulty with pills but she has not had an episode where pills regurgitated.  She denies abdominal pain. Patient says the last time she was able to sit in the recliner was about a month ago the last time she was able to stand up was about 3 months ago.  Current Medications:  Current Facility-Administered Medications:    0.9 %  sodium chloride infusion, , Intravenous, PRN, Tat, David, MD, Last Rate: 10 mL/hr at 04/22/21 1019, New Bag at 04/22/21 1019   acetaminophen (TYLENOL) tablet 650 mg, 650 mg, Oral, Q6H PRN, 650 mg at 04/28/21 0318 **OR** acetaminophen (TYLENOL) suppository 650 mg, 650 mg, Rectal, Q6H PRN, Tat, David, MD   ascorbic acid (VITAMIN C) tablet 500 mg, 500 mg, Oral, Daily, Tat, David, MD, 500 mg at 04/28/21 2778   buPROPion (WELLBUTRIN SR) 12 hr tablet 150 mg, 150 mg, Oral, Daily, Tat, David, MD, 150 mg at 04/28/21 2423   busPIRone (BUSPAR) tablet 5 mg, 5 mg, Oral, BID, Tat, David, MD, 5 mg at 04/28/21 5361   cholecalciferol (VITAMIN D3) tablet 1,000 Units, 1,000 Units, Oral, Daily, Tat, David, MD, 1,000 Units at 04/28/21 0833   enoxaparin (LOVENOX) injection 85 mg, 85 mg, Subcutaneous, Q24H, Tat, Shanon Brow, MD, 85 mg at 44/31/54 0086   folic acid (FOLVITE) tablet 1 mg, 1 mg, Oral, Daily, Tat, David, MD, 1 mg at 04/28/21 7619   hydrOXYzine (ATARAX) tablet 25 mg, 25 mg, Oral, TID PRN, Tat, Shanon Brow, MD, 25 mg at 04/28/21 0942   Immune Globulin 10% (PRIVIGEN) IV infusion 40 g, 400 mg/kg (Adjusted), Intravenous, Q24 Hr x 5, Yadav, Priyanka O, MD, 40 g at 04/27/21 1739   linaclotide (LINZESS) capsule 290 mcg, 290 mcg, Oral, QAC breakfast, Tat, David, MD, 290 mcg at 04/28/21 5093   melatonin tablet 9 mg, 9 mg, Oral, QHS PRN, Adefeso, Oladapo, DO, 9 mg at 04/27/21 2138   methocarbamol (ROBAXIN) tablet 500 mg, 500 mg, Oral, Q6H PRN, Tat, David,  MD   metoprolol tartrate (LOPRESSOR) tablet 12.5 mg, 12.5 mg, Oral, BID, Tat, David, MD, 12.5 mg at 04/28/21 0834   nystatin (MYCOSTATIN) 100000 UNIT/ML suspension 500,000 Units, 5 mL, Oral, QID, Adefeso, Oladapo, DO, 500,000 Units at 04/28/21 1240   ondansetron (ZOFRAN) tablet 4 mg, 4 mg, Oral, Q6H PRN **OR** ondansetron (ZOFRAN) injection 4 mg, 4 mg, Intravenous, Q6H PRN, Tat, David, MD   oxyCODONE (Oxy IR/ROXICODONE) immediate release tablet 5 mg, 5 mg, Oral, Q12H PRN, Barton Dubois, MD, 5 mg at 04/28/21 0942   pantoprazole (PROTONIX) EC tablet 40 mg, 40 mg, Oral, BID, Tat, David, MD, 40 mg at 04/28/21 2671   polyethylene glycol (MIRALAX / GLYCOLAX) packet 17 g, 17 g, Oral, BID, Tat, David, MD, 17 g at 04/26/21 2136   [COMPLETED] predniSONE (DELTASONE) tablet 50 mg, 50 mg, Oral, Q breakfast, 50 mg at 04/25/21 0851 **FOLLOWED BY** [COMPLETED] predniSONE (DELTASONE) tablet 40 mg, 40 mg, Oral, Q breakfast, 40 mg at 04/26/21 1124 **FOLLOWED BY** [COMPLETED] predniSONE (DELTASONE) tablet 30 mg, 30 mg, Oral, Q breakfast, 30 mg at 04/27/21 0928 **FOLLOWED BY** [COMPLETED] predniSONE (DELTASONE) tablet 20 mg, 20 mg, Oral, Q breakfast, 20 mg at 04/28/21 0837 **FOLLOWED BY** [START ON 04/29/2021] predniSONE (DELTASONE) tablet 10 mg, 10 mg, Oral, Q breakfast, Yadav, Priyanka O, MD   pregabalin (LYRICA) capsule 75 mg, 75 mg, Oral, TID, Tat,  Shanon Brow, MD, 75 mg at 04/28/21 6168   senna-docusate (Senokot-S) tablet 1 tablet, 1 tablet, Oral, QHS PRN, Tat, David, MD   vitamin B-12 (CYANOCOBALAMIN) tablet 500 mcg, 500 mcg, Oral, Daily, Tat, David, MD, 500 mcg at 04/28/21 0831   zinc sulfate capsule 220 mg, 220 mg, Oral, Daily, Henri Medal, RPH, 220 mg at 04/28/21 3729   Objective: Blood pressure 115/63, pulse (!) 109, temperature 97.8 F (36.6 C), resp. rate 18, height _0  (1.626 m), weight (!) 170.4 kg, last menstrual period 04/09/2021, SpO2 96 %. Patient is alert and in no acute distress. Abdomen is obese but  soft and nontender with organomegaly or masses.   Labs/studies Results:   CBC Latest Ref Rng & Units 04/26/2021 04/25/2021 04/24/2021  WBC 4.0 - 10.5 K/uL 12.7(H) 12.3(H) 11.3(H)  Hemoglobin 12.0 - 15.0 g/dL 8.7(L) 8.7(L) 9.4(L)  Hematocrit 36.0 - 46.0 % 29.5(L) 29.8(L) 30.5(L)  Platelets 150 - 400 K/uL 287 263 325    CMP Latest Ref Rng & Units 04/28/2021 04/27/2021 04/26/2021  Glucose 70 - 99 mg/dL - - 68(L)  BUN 6 - 20 mg/dL - - 9  Creatinine 0.44 - 1.00 mg/dL - - <0.30(L)  Sodium 135 - 145 mmol/L - - 133(L)  Potassium 3.5 - 5.1 mmol/L - - 3.7  Chloride 98 - 111 mmol/L - - 100  CO2 22 - 32 mmol/L - - 26  Calcium 8.9 - 10.3 mg/dL - - 8.7(L)  Total Protein 6.5 - 8.1 g/dL 7.9 7.5 6.9  Total Bilirubin 0.3 - 1.2 mg/dL 3.0(H) 2.7(H) 3.5(H)  Alkaline Phos 38 - 126 U/L 99 88 77  AST 15 - 41 U/L 493(H) 378(H) 313(H)  ALT 0 - 44 U/L 225(H) 189(H) 191(H)    Hepatic Function Latest Ref Rng & Units 04/28/2021 04/27/2021 04/26/2021  Total Protein 6.5 - 8.1 g/dL 7.9 7.5 6.9  Albumin 3.5 - 5.0 g/dL 2.5(L) 2.5(L) 2.6(L)  AST 15 - 41 U/L 493(H) 378(H) 313(H)  ALT 0 - 44 U/L 225(H) 189(H) 191(H)  Alk Phosphatase 38 - 126 U/L 99 88 77  Total Bilirubin 0.3 - 1.2 mg/dL 3.0(H) 2.7(H) 3.5(H)  Bilirubin, Direct 0.0 - 0.2 mg/dL 1.2(H) 1.2(H) -    HCVRNA by PCR is negative. Peripheral smear is still pending. Zinc and copper levels are pending.  24-hour urinary collection for copper level is underway.  Assessment:  #1.  Hepatocellular injury.  Transaminases peaked 4 days ago when AST was 626 and ALT was 252 but since then has been trending down although in the last 24 hours.  She has been tested negative for hepatitis A, B, and C and serology negative for EBV and CMV. Autoimmune markers are also negative.  Alpha-1 antitrypsin level is normal as is M2 antibody.  Serum ceruloplasmin level is low raising possibility of Wilson's disease.  Therefore we are checking zinc and copper levels and also doing quantitative  urinary copper.  We will also screen for HSV infection.  I agree with Dr. Anson Fret decision to discontinue Depakote as drug-induced hepatic injury still very likely. Fatty liver as such would not lead to this degree of transaminitis. COVID infection remains in differential diagnosis.  #2.  Anemia.  H&H is low but stable.  She has a history of low B12 and folate levels dating back to September 2022.  She is on replacement therapy.  Suspect anemia has primarily due to chronic disease.  #3.  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      #4.  Morbid obesity.  Unfortunately this is recovery efforts.  She has developed decubitus ulcers.  Plan:  We will also check serum HSV levels. Okay to discharge once urine collection completed. Will request LFTs in 1 week at [facility] and will arrange for follow-up visit in few weeks.

## 2021-04-28 NOTE — TOC Transition Note (Signed)
Transition of Care [facility]) - CM/SW Discharge Note   Patient Details  Name: RAPHAELLA HASKIN MRN: [id number] Date of Birth: 30-Sep-1990  [facility]) CM/SW Contact:  Ihor Gully, LCSW Phone Number: 04/28/2021, 4:02 PM   Clinical Narrative:    D/c clinicals sent to facilities. RN to call report and EMS when patient is ready. Patient to go to room A22-1. TOC signing off.       Barriers to Discharge: No Barriers Identified   Patient Goals and CMS Choice Patient states their goals for this hospitalization and ongoing recovery are:: from SNF      Discharge Placement              Patient chooses bed at: Other - please specify in the comment section below: ([facility]) Patient to be transferred to facility by: EMS Name of family member notified: relative, [facility]    Discharge Plan and Services In-house Referral: Clinical Social Work                                   Social Determinants of Health (SDOH) Interventions     Readmission Risk Interventions Readmission Risk Prevention Plan 04/28/2021 04/17/2021  Transportation Screening Complete Complete  PCP or Specialist Appt within 3-5 Days - Complete  HRI or [place] - Complete  Social Work Consult for [place] Planning/Counseling - Complete  Palliative Care Screening - Not Applicable  Medication Review [employer]) Complete Complete  PCP or Specialist appointment within 3-5 days of discharge Complete -  [place] or Home Care Consult Complete -  SW Recovery Care/Counseling Consult Complete -  Palliative Care Screening Not Applicable -  Skilled Nursing Facility Complete -  Some recent data might be hidden

## 2021-04-29 NOTE — Discharge Summary (Signed)
Physician Discharge Summary  Carla Little [id number] DOB: [birth year] DOA: 04/21/2021   PCP: Patient, No Pcp Per (Inactive)   Admit date: 04/21/2021 Discharge date: 04/28/2021   Time spent: 35 minutes   Recommendations for Outpatient Follow-up:  Repeat complete metabolic panel to follow electrolytes, renal function and LFTs. Repeat CBC to follow WBCs count and hemoglobin and stability. Outpatient follow-up with gastroenterology service (office will contact with appointment details).   Discharge Diagnoses:  Principal Problem:   AIDP (acute inflammatory demyelinating polyneuropathy) (HCC) Active Problems:   Nonalcoholic steatohepatitis (NASH)   Pneumonia due to COVID-19 virus   Obesity, morbid, BMI 50 or higher (HCC)   Transaminasemia   Anemia   Elevated LFTs     Discharge Condition: Stable and improved.  Discharged back to skilled nursing facility for further care and rehabilitation.  CODE STATUS: Full code   Diet recommendation: Heart healthy/low calorie diet.      Filed Weights    04/21/21 1000  Weight: (!) 170.4 kg      History of present illness:  31 y.o. 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  Notably, patient had a hospitalization from 12/09/2020 to 01/26/2021 during which time she was treated for AIDP when she received 5 doses of IVIG.  She was subsequently discharged to SNF.  She stated that her strength gradually improved.  She continued to have dysesthesias in her arms and legs.  However, was readmitted to the hospital from 04/11/2021 to 04/17/2021  when she was treated for acute respiratory failure secondary to COVID-19 pneumonia.  During that admission, the patient received 5 days of remdesivir and steroids.  She was discharged home with prednisone which she finished on 04/16/2021.  On the next day, she began having the throat closing up sensation.  She denies any frank sore throat or dysphagia or odynophagia.  She denies any fevers, chills, chest pain, worsening shortness of breath, nausea, vomiting, diarrhea, abdominal pain.  She states that her extremity weakness has been improving up until the point where she contracted COVID-19 pneumonia.  Since then, she feels that her extremities have been weaker.  She states that her dysesthesias have been about the same since her COVID-19 pneumonia.  The patient states  that she has been able to swallow pills and eat solid food without much difficulty.  There is no dysphagia or odynophagia.  She states that her face and neck have not been swollen.  She states that the Tylenol has not been swollen, but she began having numbness and tingling in her tongue on 04/20/2021. In the ED, patient was afebrile hemodynamically stable with oxygen saturation 92% on room air.  BMP showed a sodium 137, potassium 3.5, CO2 28, serum creatinine 0.31.  AST 259, ALT 127, phosphatase 89, total bilirubin 5.3.  WBC 14.8, hemoglobin 10.9, platelets 3 98,000.  UA was negative for pyuria.  CT of the neck shows mildly prominent palatine tonsils with partial effaced oral pharyngeal airway without any abnormal fluid collection or abnormal enhancement.  The parapharyngeal spaces were clear, and tongue was normal.  There was normal salivary glands.  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 -1/31--pt states tongue still numb but throat closing sensation continues to improve -NIF neg 12; IS 1200 cc>>1445, NIF neg 40>>>continue each shift -04/24/21--Case discussed with Dr. Hortense Ramal (neurology service)>> patient completed 5 days of IVIG infusion.   COVID-19 pneumonia -Patient was previously treated with 5 days of remdesivir -Solu-Medrol 1000 mg daily x3 days--finished on 1/29 -CRP--13.4>>6.8>>3.8>>4.1 -ferritin--115>>84>115>>156 -PCT 0.13 -No requiring oxygen supplementation.   Tranasminasemia -Suspect related to the patient's COVID-19 infection vs autoimmune -Repeat right upper quadrant ultrasound -Fractionated bilirubin--mostly direct -Hepatitis B surface antigen--neg -Hepatitis C antibody--neg -Continue to trend -EBV DNA--negative -CMV DNA--neg -discontinue depakote given elevated LFTs. -Haptoglobin 167 (normal) -tolerating diet without abd pain -LFTs continue trending down; continue to follow levels. -Continue supportive care and treatment with IVIG. -Continue to follow with GI service as an outpatient; copper in urine collection completed and final results pending at discharge. -Ceruloplasmin level 17.   Depression/anxiety -Continue Wellbutrin and BuSpar -Patient's Depakote has been discontinue due to elevated LFTs. -Overall mood is a stable; no suicidal ideation or hallucinations.   Constipation -Continue the use of Linzess.   Morbid obesity -BMI 64.49 -Low calorie diet and portion control discussed with patient.   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   Stage 2 gluteal decubitus -present on admission -continue local care, constant repositioning and preventive measures.   Hypokalemia -Repleted -Magnesium within normal limits -Repeat basic metabolic panel to assess  electrolytes stability in 1 week. -Potassium 3.7 at time of discharge.     Chronic leg pain -Continue the use of as needed oxycodone. -Avoid the use of NSAIDs.   Procedures: See below for x-ray reports.   Consultations: Gastroenterology service Neurology service.   Discharge Exam:     Vitals:    04/28/21 0838 04/28/21 1353  BP: 115/63 115/77  Pulse: (!) 109 99  Resp:   18  Temp:   98.4 F (36.9 C)  SpO2: 96% 98%    General exam: Alert, awake, oriented x 3, reports improvement in her leg pain with current analgesic regimen; no nausea, no vomiting, no overt bleeding.  Patient is afebrile. Respiratory system: Good air movement bilaterally.Marland Kitchen Respiratory effort normal.  Good saturation on room air.  No wheezing on exam. Cardiovascular system:RRR. No murmurs, rubs, gallops.  Unable to properly assess JVD with body habitus. Gastrointestinal system: Abdomen is obese, nondistended, soft and nontender. No organomegaly or masses felt. Normal bowel sounds heard. Central nervous system: Alert and oriented. No focal neurological deficits. Extremities: No cyanosis or clubbing. Skin: No petechiae.  Stage II decubitus gluteal pressure injury present at time of admission without signs of superimposed infection. Psychiatry: Judgement and insight appear normal. Mood & affect appropriate.      Discharge Instructions     Discharge Instructions       Diet - low sodium heart healthy   Complete by: As directed      Discharge instructions   Complete by: As directed      Follow low-sodium diet Maintain adequate hydration Take medications as prescribed Follow-up with PCP in 10 days and follow-up with gastroenterology service as instructed. Avoid the use of NSAIDs    Discharge wound care:   Complete by: As directed      Keep area clean and dry; provide close follow on for skin healing process and make sure patient received constant repositioning.         Allergies as of 04/28/2021          Reactions    Azithromycin Shortness Of Breath, Nausea And Vomiting            Medication List       STOP taking these medications     ciprofloxacin 500 MG tablet Commonly known as: Cipro    diphenhydrAMINE 25 MG tablet Commonly known as: BENADRYL    divalproex 125 MG capsule Commonly known as: DEPAKOTE SPRINKLE    lidocaine 5 % Commonly known as: LIDODERM    omeprazole 20 MG capsule Commonly known as: PRILOSEC Replaced by: pantoprazole 40 MG tablet    phenazopyridine 100 MG tablet Commonly known as: PYRIDIUM           TAKE these medications     acetaminophen 325 MG tablet Commonly known as: TYLENOL Take 2 tablets (650 mg total) by mouth every 6 (six) hours as needed for mild pain (or Fever >/= 101).    albuterol 108 (90 Base) MCG/ACT inhaler Commonly known as: VENTOLIN HFA Inhale 1 puff into the lungs every 6 (six) hours as needed for wheezing or shortness of breath.    ascorbic acid 500 MG tablet Commonly known as: VITAMIN C Take 500 mg by mouth daily.    buPROPion 150 MG 12 hr tablet Commonly known as: WELLBUTRIN SR Take 150 mg by mouth daily.    busPIRone 5 MG tablet Commonly known as: BUSPAR Take 1 tablet (5 mg total) by mouth 2 (two) times daily.    cyanocobalamin 1000 MCG/ML injection Commonly known as: (VITAMIN B-12) Inject 1 mL (1,000 mcg total) into the muscle every 30 (thirty) days. What changed: Another medication with the same name was added. Make sure you understand how and when to take each.    vitamin B-12 500 MCG tablet Commonly known as: CYANOCOBALAMIN Take 1 tablet (500 mcg total) by mouth daily. Start taking on: April 29, 2021 What changed: You were already taking a medication with the same name, and this prescription was added. Make sure you understand how and when to take each.    folic acid 1 MG tablet Commonly known as: FOLVITE Take 1 tablet (1 mg total) by mouth daily.    Gerhardt's butt cream Crea Apply 1 application  topically as needed for irritation.    guaiFENesin-dextromethorphan 100-10 MG/5ML syrup Commonly known as: ROBITUSSIN  DM Take 10 mLs by mouth every 4 (four) hours as needed for cough.    hydrocortisone 25 MG suppository Commonly known as: ANUSOL-HC Place 1 suppository (25 mg total) rectally 2 (two) times daily.    hydrOXYzine 25 MG tablet Commonly known as: ATARAX Take 1 tablet (25 mg total) by mouth every 8 (eight) hours as needed for anxiety.    linaclotide 290 MCG Caps capsule Commonly known as: LINZESS Take 1 capsule (290 mcg total) by mouth daily before breakfast.    melatonin 5 MG Tabs Take 10 mg by mouth at bedtime.    methocarbamol 500 MG tablet Commonly known as: ROBAXIN Take 1 tablet (500 mg total) by mouth every 6 (six) hours as needed for muscle spasms.    metoprolol tartrate 25 MG tablet Commonly known as: LOPRESSOR Take 0.5 tablets (12.5 mg total) by mouth 2 (two) times daily.    nystatin 100000 UNIT/ML suspension Commonly known as: MYCOSTATIN Take 5 mLs (500,000 Units total) by mouth 4 (four) times daily for 5 days.    ondansetron 4 MG disintegrating tablet Commonly known as: ZOFRAN-ODT Take 1 tablet (4 mg total) by mouth every 8 (eight) hours as needed for nausea or vomiting.    oxyCODONE 5 MG immediate release tablet Commonly known as: Oxy IR/ROXICODONE Take 1 tablet (5 mg total) by mouth every 12 (twelve) hours as needed for severe pain. What changed: when to take this    pantoprazole 40 MG tablet Commonly known as: PROTONIX Take 1 tablet (40 mg total) by mouth 2 (two) times daily. Replaces: omeprazole 20 MG capsule    polyethylene glycol 17 g packet Commonly known as: MIRALAX / GLYCOLAX Take 17 g by mouth 2 (two) times daily.    predniSONE 10 MG tablet Commonly known as: DELTASONE Take 1 tablet (10 mg total) by mouth daily with breakfast. Start taking on: April 29, 2021 What changed:  how much to take how to take this when to take  this additional instructions    pregabalin 75 MG capsule Commonly known as: LYRICA Take 1 capsule (75 mg total) by mouth 3 (three) times daily.    senna-docusate 8.6-50 MG tablet Commonly known as: Senokot-S Take 1 tablet by mouth at bedtime as needed for mild constipation.    Vitamin D3 25 MCG tablet Commonly known as: Vitamin D Take 2 tablets (2,000 Units total) by mouth daily. What changed: how much to take    Zinc 220 (50 Zn) MG Caps Take 1 capsule by mouth daily at 6 (six) AM. For 14 days due to covid                        Discharge Care Instructions  (From admission, onward)                 Start     Ordered    04/28/21 0000   Discharge wound care:       Comments: Keep area clean and dry; provide close follow on for skin healing process and make sure patient received constant repositioning.   04/28/21 1531                    Allergies  Allergen Reactions   Azithromycin Shortness Of Breath and Nausea And Vomiting      Contact information for after-discharge care       [facility] .   Service: Skilled  Nursing Contact information: [place] [phone]                               The results of significant diagnostics from this hospitalization (including imaging, microbiology, ancillary and laboratory) are listed below for reference.     Significant Diagnostic Studies:  Imaging Results  CT Soft Tissue Neck W Contrast   Result Date: 04/21/2021 CLINICAL DATA:  Possible throat swelling starting 3 days ago, difficulty swelling EXAM: CT NECK WITH CONTRAST TECHNIQUE: Multidetector CT imaging of the neck was performed using the standard protocol following the bolus administration of intravenous contrast. RADIATION DOSE REDUCTION: This exam was performed according to the departmental dose-optimization program which includes automated exposure control,  adjustment of the mA and/or kV according to patient size and/or use of iterative reconstruction technique. CONTRAST:  59mL OMNIPAQUE IOHEXOL 300 MG/ML  SOLN COMPARISON:  None. FINDINGS: Pharynx and larynx: The nasal cavity and nasopharynx unremarkable. The bilateral palatine tonsils are mildly prominent and partially efface the oropharyngeal airway the without abnormal enhancement or fluid collection. The oral cavity and oropharynx are otherwise unremarkable. The parapharyngeal spaces are clear. The tongue is normal in appearance. The hypopharynx and larynx are unremarkable. The vocal folds are normal in appearance. Salivary glands: The parotid glands are unremarkable. There is fatty atrophy of the submandibular glands. Thyroid: Unremarkable. Lymph nodes: There is a 9 mm left level II lymph node and 7 mm right level II node, nonspecific and not pathologically enlarged by size criteria. There is no pathologic lymphadenopathy in the neck. Vascular: Unremarkable. Limited intracranial: Imaged portions of the intracranial compartment are unremarkable. Visualized orbits: Globes and orbits are unremarkable. Mastoids and visualized paranasal sinuses: The imaged paranasal sinuses are clear. The mastoid air cells are clear. Skeleton: There is no acute osseous abnormality or aggressive osseous lesion. Upper chest: Are patchy opacities in the lung apices, overall slightly improved compared to the CTA chest from 04/12/2021. Other: None. IMPRESSION: 1. Prominent bilateral palatine tonsils which partially efface the oropharyngeal airway but without abnormal enhancement or mass lesion. No abnormal fluid collection. 2. Subcentimeter bilateral level II lymph nodes, nonspecific and may be reactive. No pathologic lymphadenopathy in the neck. 3. Patchy opacities in the lung apices likely reflecting infection, improved compared to the CTA chest from 04/12/2021. Electronically Signed   By: Valetta Mole M.D.   On: 04/21/2021 12:04    CT  Angio Chest Pulmonary Embolism (PE) W or WO Contrast   Result Date: 04/12/2021 CLINICAL DATA:  Concern for pulmonary embolism. EXAM: CT ANGIOGRAPHY CHEST WITH CONTRAST TECHNIQUE: Multidetector CT imaging of the chest was performed using the standard protocol during bolus administration of intravenous contrast. Multiplanar CT image reconstructions and MIPs were obtained to evaluate the vascular anatomy. RADIATION DOSE REDUCTION: This exam was performed according to the departmental dose-optimization program which includes automated exposure control, adjustment of the mA and/or kV according to patient size and/or use of iterative reconstruction technique. CONTRAST:  18mL OMNIPAQUE IOHEXOL 350 MG/ML SOLN COMPARISON:  CT dated 11/03/2020 and chest radiograph dated 04/12/2021. FINDINGS: Evaluation of this exam is limited due to respiratory motion artifact. Cardiovascular: Top-normal cardiac size. No pericardial effusion. The thoracic aorta is unremarkable. Evaluation of the pulmonary arteries is very limited due to severe respiratory motion artifact. No large or central pulmonary artery embolus identified. Mediastinum/Nodes: No hilar or mediastinal adenopathy. The esophagus is grossly unremarkable. No mediastinal fluid collection. Lungs/Pleura: Bilateral  patchy airspace opacities most concerning for multilobar pneumonia. No pleural effusion pneumothorax. The central airways are patent. Upper Abdomen: Severe fatty liver. Musculoskeletal: No chest wall abnormality. No acute or significant osseous findings. Review of the MIP images confirms the above findings. IMPRESSION: 1. No CT evidence of central pulmonary artery embolus. 2. Multifocal pneumonia. Clinical correlation and follow-up to resolution recommended. 3. Severe fatty liver. Electronically Signed   By: Anner Crete M.D.   On: 04/12/2021 02:21    MR BRAIN W WO CONTRAST   Result Date: 04/21/2021 CLINICAL DATA:  Numbness or tingling, paresthesia (Ped 0-17y)  tongue paresthesias, lump in throat after covid EXAM: MRI HEAD WITHOUT AND WITH CONTRAST TECHNIQUE: Multiplanar, multiecho pulse sequences of the brain and surrounding structures were obtained without and with intravenous contrast. CONTRAST:  42mL GADAVIST GADOBUTROL 1 MMOL/ML IV SOLN COMPARISON:  12/20/2020 FINDINGS: Brain: There is no acute infarction or intracranial hemorrhage. There is no intracranial mass, mass effect, or edema. There is no hydrocephalus or extra-axial fluid collection. Ventricles and sulci are normal in size and configuration. No abnormal enhancement. Vascular: Major vessel flow voids at the skull base are preserved. Skull and upper cervical spine: Normal marrow signal is preserved. Sinuses/Orbits: Paranasal sinuses are aerated. Orbits are unremarkable. Other: Sella is unremarkable. Mastoid air cells are clear. Prominence of the palatine tonsils. IMPRESSION: No evidence of recent infarction, hemorrhage, or mass. No abnormal enhancement. Prominence of the palatine tonsils. Electronically Signed   By: Macy Mis M.D.   On: 04/21/2021 17:09    MR CERVICAL SPINE W WO CONTRAST   Result Date: 04/24/2021 EXAM: MRI CERVICAL SPINE WITHOUT AND WITH CONTRAST TECHNIQUE: Multiplanar and multiecho pulse sequences of the cervical spine, to include the craniocervical junction and cervicothoracic junction, were obtained without and with intravenous contrast. CONTRAST:  47mL GADAVIST GADOBUTROL 1 MMOL/ML IV SOLN COMPARISON:  None. FINDINGS: Alignment: Physiologic. Vertebrae: No fracture, evidence of discitis, or bone lesion. Cord: Normal signal and morphology.  No abnormal enhancement. Posterior Fossa, vertebral arteries, paraspinal tissues: Negative. Disc levels: C2-3: No significant disc bulge. No neural foraminal stenosis. No central canal stenosis. C3-4: No significant disc bulge. No neural foraminal stenosis. No central canal stenosis. C4-5: No significant disc bulge. No neural foraminal stenosis.  No central canal stenosis. C5-6: No significant disc bulge. No neural foraminal stenosis. No central canal stenosis. C6-7: No significant disc bulge. No neural foraminal stenosis. No central canal stenosis. C7-T1: No significant disc bulge. No neural foraminal stenosis. No central canal stenosis. IMPRESSION: Normal examination. Electronically Signed   By: Keane Police D.O.   On: 04/24/2021 17:06    DG Chest Port 1 View   Result Date: 04/12/2021 CLINICAL DATA:  Shortness of breath. EXAM: PORTABLE CHEST 1 VIEW COMPARISON:  Chest radiograph dated 01/02/2021. FINDINGS: Shallow inspiration. There is mild cardiomegaly with mild vascular congestion. Left upper lobe streaky densities may represent vascular congestion. Developing infiltrate is not excluded. Clinical correlation is recommended. No focal consolidation, pleural effusion, or pneumothorax. No acute osseous pathology. IMPRESSION: 1. Mild cardiomegaly with mild vascular congestion. 2. Left upper lobe streaky densities may represent vascular congestion versus developing infiltrate. Electronically Signed   By: Anner Crete M.D.   On: 04/12/2021 00:38    US Abdomen Limited RUQ (LIVER/GB)   Result Date: 04/22/2021 CLINICAL DATA:  Abnormal liver function tests EXAM: ULTRASOUND ABDOMEN LIMITED RIGHT UPPER QUADRANT COMPARISON:  01/03/2021 FINDINGS: Gallbladder: There are hyperechoic foci in the dependent portion of gallbladder suggesting gallbladder stones. There is no definite demonstrable  intraluminal mobility in the submitted images. There is no wall thickening. Technologist did not observe any tenderness over the gallbladder. Common bile duct: Diameter: 3 mm Liver: There is increased echogenicity suggesting fatty infiltration. No focal abnormality is seen in the visualized portions of liver. Portal vein is patent on color Doppler imaging with normal direction of blood flow towards the liver. Other: None. IMPRESSION: Linear hyperechoic focus seen in the  posterior margin of gallbladder may suggest layering of small stones or calcification in the gallbladder wall. There are no signs of acute cholecystitis. Fatty liver. Electronically Signed   By: Elmer Picker M.D.   On: 04/22/2021 12:12       Microbiology:        Recent Results (from the past 240 hour(s))  Resp Panel by RT-PCR (Flu A&B, Covid) Nasopharyngeal Swab     Status: Abnormal    Collection Time: 04/21/21  2:13 PM    Specimen: Nasopharyngeal Swab; Nasopharyngeal(NP) swabs in vial transport medium  Result Value Ref Range Status    SARS Coronavirus 2 by RT PCR POSITIVE (A) NEGATIVE Final      Comment: (NOTE) SARS-CoV-2 target nucleic acids are DETECTED.   The SARS-CoV-2 RNA is generally detectable in upper respiratory specimens during the acute phase of infection. Positive results are indicative of the presence of the identified virus, but do not rule out bacterial infection or co-infection with other pathogens not detected by the test. Clinical correlation with patient history and other diagnostic information is necessary to determine patient infection status. The expected result is Negative.   Fact Sheet for Patients: EntrepreneurPulse.com.au   Fact Sheet for Healthcare Providers: IncredibleEmployment.be   This test is not yet approved or cleared by the [place] FDA and  has been authorized for detection and/or diagnosis of SARS-CoV-2 by FDA under an Emergency Use Authorization (EUA).  This EUA will remain in effect (meaning this test can be used) for the duration of  the COVID-19 declaration under Section 564(b)(1) of the A ct, 21 U.S.C. section 360bbb-3(b)(1), unless the authorization is terminated or revoked sooner.        Influenza A by PCR NEGATIVE NEGATIVE Final    Influenza B by PCR NEGATIVE NEGATIVE Final      Comment: (NOTE) The Xpert Xpress SARS-CoV-2/FLU/RSV plus assay is intended as an aid in the diagnosis of  influenza from Nasopharyngeal swab specimens and should not be used as a sole basis for treatment. Nasal washings and aspirates are unacceptable for Xpert Xpress SARS-CoV-2/FLU/RSV testing.   Fact Sheet for Patients: EntrepreneurPulse.com.au   Fact Sheet for Healthcare Providers: IncredibleEmployment.be   This test is not yet approved or cleared by the [place] FDA and has been authorized for detection and/or diagnosis of SARS-CoV-2 by FDA under an Emergency Use Authorization (EUA). This EUA will remain in effect (meaning this test can be used) for the duration of the COVID-19 declaration under Section 564(b)(1) of the Act, 21 U.S.C. section 360bbb-3(b)(1), unless the authorization is terminated or revoked.   Performed at [facility], [address] [place], [place] [address]        Labs: Basic Metabolic Panel: Last Labs          Recent Labs  Lab 04/22/21 0458 04/23/21 0556 04/24/21 0430 04/25/21 0533 04/26/21 0531  NA 139 140 138 135 133*  K 3.5 3.3* 3.4* 3.5 3.7  CL 100 102 99 99 100  CO2 26 27 28 27 26   GLUCOSE 137* 121* 96 76 68*  BUN 7 15 16 13 9   CREATININE <0.30* 0.33* 0.34* <0.30* <0.30*  CALCIUM 9.1 8.9 9.0 8.4* 8.7*  MG 1.8  --   --   --  1.8      Liver Function Tests: Last Labs          Recent Labs  Lab 04/24/21 0430 04/25/21 0533 04/26/21 0531 04/27/21 0604 04/28/21 0524  AST 626* 434* 313* 378* 493*  ALT 252* 218* 191* 189* 225*  ALKPHOS 106 83 77 88 99  BILITOT 3.8* 4.6* 3.5* 2.7* 3.0*  PROT 6.7 6.4* 6.9 7.5 7.9  ALBUMIN 3.0* 2.6* 2.6* 2.5* 2.5*      CBC: Last Labs          Recent Labs  Lab 04/22/21 0458 04/23/21 0556 04/24/21 0430 04/25/21 0533 04/26/21 0531  WBC 10.0 10.5 11.3* 12.3* 12.7*  NEUTROABS  --   --   --   --  8.3*  HGB 10.2* 10.0* 9.4* 8.7* 8.7*  HCT 32.4* 31.6* 30.5* 29.8* 29.5*  MCV 85.9 85.6 87.1 92.5 91.6  PLT 349 367 325 263 287      CBG: Last Labs           Recent Labs  Lab 04/27/21 1124 04/27/21 1609 04/27/21 2137 04/28/21 0753 04/28/21 1114  GLUCAP 108* 142* 114* 78 110*      Signed:   Barton Dubois MD.  [facility] 04/28/2021, 3:33 PM               Note Details  Carla Mcardle, MD File Time 04/28/2021  3:41 PM  Author Type Physician Status Signed  Last Editor Barton Dubois, [place] # [id number] Admit Date 04/21/2021

## 2021-04-30 LAB — HSV 1 ANTIBODY, IGG: HSV 1 Glycoprotein G Ab, IgG: 47.7 index — ABNORMAL HIGH (ref 0.00–0.90)

## 2021-04-30 LAB — HSV 2 ANTIBODY, IGG: HSV 2 Glycoprotein G Ab, IgG: 13.8 index — ABNORMAL HIGH (ref 0.00–0.90)

## 2021-05-01 LAB — GLUCOSE, CAPILLARY: Glucose-Capillary: 110 mg/dL — ABNORMAL HIGH (ref 70–99)

## 2021-05-01 NOTE — Discharge Summary (Signed)
Physician Discharge Summary   Patient: Carla Little MRN: [id number] DOB: 1990-12-30  Admit date:     04/21/2021  Discharge date: 04/29/21  Discharge Physician: Barton Dubois   PCP: Patient, No Pcp Per (Inactive)   Recommendations at discharge:  Repeat complete metabolic panel to follow electrolytes, renal function and LFTs. Repeat CBC to follow WBCs count and hemoglobin and stability. Outpatient follow-up with gastroenterology service (office will contact with appointment details).   Discharge Diagnoses: Principal Problem:   AIDP (acute inflammatory demyelinating polyneuropathy) (HCC) Active Problems:   Nonalcoholic steatohepatitis (NASH)   Pneumonia due to COVID-19 virus   Obesity, morbid, BMI 50 or higher (HCC)   Transaminasemia   Anemia   Elevated LFTs  History of present illness:  31 y.o. 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  Notably, patient had a hospitalization from 12/09/2020 to 01/26/2021 during which time she was treated for AIDP when she received 5 doses of IVIG.  She was subsequently discharged to SNF.  She stated that her strength gradually improved.  She continued to have dysesthesias in her arms and legs.  However, was readmitted to the hospital from 04/11/2021 to 04/17/2021 when she was treated for acute respiratory failure secondary to COVID-19 pneumonia.  During that admission, the patient received 5 days of remdesivir and steroids.  She was discharged home with prednisone which she finished on 04/16/2021.  On the next day, she began having the throat closing up sensation.  She denies any frank sore throat or dysphagia or odynophagia.  She denies any fevers, chills, chest pain, worsening shortness of breath, nausea, vomiting, diarrhea, abdominal pain.  She states that her extremity weakness has been  improving up until the point where she contracted COVID-19 pneumonia.  Since then, she feels that her extremities have been weaker.  She states that her dysesthesias have been about the same since her COVID-19 pneumonia.  The patient states that she has been able to swallow pills and eat solid food without much difficulty.  There is no dysphagia or odynophagia.  She states that her face and neck have not been swollen.  She states that the Tylenol has not been swollen, but she began having numbness and tingling in her tongue on 04/20/2021. In the ED, patient was afebrile hemodynamically stable with oxygen saturation 92% on room air.  BMP showed a sodium 137, potassium 3.5, CO2 28, serum creatinine 0.31.  AST 259, ALT 127, phosphatase 89, total bilirubin 5.3.  WBC 14.8, hemoglobin 10.9, platelets 3 98,000.  UA was negative for pyuria.  CT of the neck shows mildly prominent palatine tonsils with partial effaced oral pharyngeal airway without any abnormal fluid collection or abnormal enhancement.  The parapharyngeal spaces were clear, and tongue was normal.  There was normal salivary glands.  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 -1/31--pt states tongue still numb but throat closing sensation continues to improve -NIF neg 12; IS 1200 cc>>1445, NIF neg 40>>>continue each shift -04/24/21--Case discussed with Dr. Hortense Ramal (neurology service)>> patient completed 5 days  of IVIG infusion.   COVID-19 pneumonia -Patient was previously treated with 5 days of remdesivir -Solu-Medrol 1000 mg daily x3  days--finished on 1/29 -CRP--13.4>>6.8>>3.8>>4.1 -ferritin--115>>84>115>>156 -PCT 0.13 -No requiring oxygen supplementation.   Tranasminasemia -Suspect related to the patient's COVID-19 infection vs autoimmune -Repeat right upper quadrant ultrasound -Fractionated bilirubin--mostly direct -Hepatitis B surface antigen--neg -Hepatitis C antibody--neg -Continue to trend -EBV DNA--negative -CMV DNA--neg -discontinue depakote given elevated LFTs. -Haptoglobin 167 (normal) -tolerating diet without abd pain -LFTs continue trending down; continue to follow levels. -Continue supportive care and treatment with IVIG. -Continue to follow with GI service as an outpatient; copper in urine collection completed and final results pending at discharge. -Ceruloplasmin level 17.   Depression/anxiety -Continue Wellbutrin and BuSpar -Patient's Depakote has been discontinue due to elevated LFTs. -Overall mood is a stable; no suicidal ideation or hallucinations.   Constipation -Continue the use of Linzess.   Morbid obesity -BMI 64.49 -Low calorie diet and portion control discussed with patient.   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   Stage 2 gluteal decubitus -present on admission -continue local care, constant repositioning and preventive measures.   Hypokalemia -Repleted -Magnesium within normal limits -Repeat basic metabolic panel to assess electrolytes stability in 1 week. -Potassium 3.7 at time of discharge.     Chronic leg pain -Continue the use of as needed oxycodone. -Avoid the use of NSAIDs.   Consultants: GI and neurology service Procedures performed: see below for x-ray reports.   Disposition: Nursing home Diet recommendation: heart healthy/low calorie diet.   DISCHARGE MEDICATION: Allergies as of 04/28/2021       Reactions   Azithromycin Shortness Of Breath, Nausea And Vomiting        Medication List     STOP taking these medications     ciprofloxacin 500 MG tablet Commonly known as: Cipro   diphenhydrAMINE 25 MG tablet Commonly known as: BENADRYL   divalproex 125 MG capsule Commonly known as: DEPAKOTE SPRINKLE   lidocaine 5 % Commonly known as: LIDODERM   omeprazole 20 MG capsule Commonly known as: PRILOSEC Replaced by: pantoprazole 40 MG tablet   phenazopyridine 100 MG tablet Commonly known as: PYRIDIUM       TAKE these medications    acetaminophen 325 MG tablet Commonly known as: TYLENOL Take 2 tablets (650 mg total) by mouth every 6 (six) hours as needed for mild pain (or Fever >/= 101).   albuterol 108 (90 Base) MCG/ACT inhaler Commonly known as: VENTOLIN HFA Inhale 1 puff into the lungs every 6 (six) hours as needed for wheezing or shortness of breath.   ascorbic acid 500 MG tablet Commonly known as: VITAMIN C Take 500 mg by mouth daily.   buPROPion 150 MG 12 hr tablet Commonly known as: WELLBUTRIN SR Take 150 mg by mouth daily.   busPIRone 5 MG tablet Commonly known as: BUSPAR Take 1 tablet (5 mg total) by mouth 2 (two) times daily.   cyanocobalamin 1000 MCG/ML injection Commonly known as: (VITAMIN B-12) Inject 1 mL (1,000 mcg total) into the muscle every 30 (thirty) days. What changed: Another medication with the same name was added. Make sure you understand how and when to take each.   vitamin B-12 500 MCG tablet Commonly known as: CYANOCOBALAMIN Take 1 tablet (500 mcg total) by mouth daily. What changed: You were already taking a medication with the same name, and this prescription was added. Make sure you understand how and when to take each.   folic acid 1 MG tablet  Commonly known as: FOLVITE Take 1 tablet (1 mg total) by mouth daily.   Gerhardt's butt cream Crea Apply 1 application topically as needed for irritation.   guaiFENesin-dextromethorphan 100-10 MG/5ML syrup Commonly known as: ROBITUSSIN DM Take 10 mLs by mouth every 4 (four) hours as needed for cough.    hydrocortisone 25 MG suppository Commonly known as: ANUSOL-HC Place 1 suppository (25 mg total) rectally 2 (two) times daily.   hydrOXYzine 25 MG tablet Commonly known as: ATARAX Take 1 tablet (25 mg total) by mouth every 8 (eight) hours as needed for anxiety.   linaclotide 290 MCG Caps capsule Commonly known as: LINZESS Take 1 capsule (290 mcg total) by mouth daily before breakfast.   melatonin 5 MG Tabs Take 10 mg by mouth at bedtime.   methocarbamol 500 MG tablet Commonly known as: ROBAXIN Take 1 tablet (500 mg total) by mouth every 6 (six) hours as needed for muscle spasms.   metoprolol tartrate 25 MG tablet Commonly known as: LOPRESSOR Take 0.5 tablets (12.5 mg total) by mouth 2 (two) times daily.   nystatin 100000 UNIT/ML suspension Commonly known as: MYCOSTATIN Take 5 mLs (500,000 Units total) by mouth 4 (four) times daily for 5 days.   ondansetron 4 MG disintegrating tablet Commonly known as: ZOFRAN-ODT Take 1 tablet (4 mg total) by mouth every 8 (eight) hours as needed for nausea or vomiting.   oxyCODONE 5 MG immediate release tablet Commonly known as: Oxy IR/ROXICODONE Take 1 tablet (5 mg total) by mouth every 12 (twelve) hours as needed for severe pain. What changed: when to take this   pantoprazole 40 MG tablet Commonly known as: PROTONIX Take 1 tablet (40 mg total) by mouth 2 (two) times daily. Replaces: omeprazole 20 MG capsule   polyethylene glycol 17 g packet Commonly known as: MIRALAX / GLYCOLAX Take 17 g by mouth 2 (two) times daily.   predniSONE 10 MG tablet Commonly known as: DELTASONE Take 1 tablet (10 mg total) by mouth daily with breakfast. What changed:  how much to take how to take this when to take this additional instructions   pregabalin 75 MG capsule Commonly known as: LYRICA Take 1 capsule (75 mg total) by mouth 3 (three) times daily.   senna-docusate 8.6-50 MG tablet Commonly known as: Senokot-S Take 1 tablet by mouth at  bedtime as needed for mild constipation.   Vitamin D3 25 MCG tablet Commonly known as: Vitamin D Take 2 tablets (2,000 Units total) by mouth daily. What changed: how much to take   Zinc 220 (50 Zn) MG Caps Take 1 capsule by mouth daily at 6 (six) AM. For 14 days due to covid               Discharge Care Instructions  (From admission, onward)           Start     Ordered   04/28/21 0000  Discharge wound care:       Comments: Keep area clean and dry; provide close follow on for skin healing process and make sure patient received constant repositioning.   04/28/21 1531            Contact information for after-discharge care     [facility] .   Service: Skilled Nursing Contact information: [address] [place] [phone]                     Discharge  Exam: Filed Weights   04/21/21 1000  Weight: (!) 170.4 kg   General exam: Alert, awake, oriented x 3, reports improvement in her leg pain with current analgesic regimen; no nausea, no vomiting, no overt bleeding.  Patient is afebrile. Respiratory system: Good air movement bilaterally.Marland Kitchen Respiratory effort normal.  Good saturation on room air.  No wheezing on exam. Cardiovascular system:RRR. No murmurs, rubs, gallops.  Unable to properly assess JVD with body habitus. Gastrointestinal system: Abdomen is obese, nondistended, soft and nontender. No organomegaly or masses felt. Normal bowel sounds heard. Central nervous system: Alert and oriented. No focal neurological deficits. Extremities: No cyanosis or clubbing. Skin: No petechiae.  Stage II decubitus gluteal pressure injury present at time of admission without signs of superimposed infection. Psychiatry: Judgement and insight appear normal. Mood & affect appropriate.   Condition at discharge: stable  The results of significant diagnostics from this hospitalization (including  imaging, microbiology, ancillary and laboratory) are listed below for reference.   Imaging Studies: CT Soft Tissue Neck W Contrast  Result Date: 04/21/2021 CLINICAL DATA:  Possible throat swelling starting 3 days ago, difficulty swelling EXAM: CT NECK WITH CONTRAST TECHNIQUE: Multidetector CT imaging of the neck was performed using the standard protocol following the bolus administration of intravenous contrast. RADIATION DOSE REDUCTION: This exam was performed according to the departmental dose-optimization program which includes automated exposure control, adjustment of the mA and/or kV according to patient size and/or use of iterative reconstruction technique. CONTRAST:  97mL OMNIPAQUE IOHEXOL 300 MG/ML  SOLN COMPARISON:  None. FINDINGS: Pharynx and larynx: The nasal cavity and nasopharynx unremarkable. The bilateral palatine tonsils are mildly prominent and partially efface the oropharyngeal airway the without abnormal enhancement or fluid collection. The oral cavity and oropharynx are otherwise unremarkable. The parapharyngeal spaces are clear. The tongue is normal in appearance. The hypopharynx and larynx are unremarkable. The vocal folds are normal in appearance. Salivary glands: The parotid glands are unremarkable. There is fatty atrophy of the submandibular glands. Thyroid: Unremarkable. Lymph nodes: There is a 9 mm left level II lymph node and 7 mm right level II node, nonspecific and not pathologically enlarged by size criteria. There is no pathologic lymphadenopathy in the neck. Vascular: Unremarkable. Limited intracranial: Imaged portions of the intracranial compartment are unremarkable. Visualized orbits: Globes and orbits are unremarkable. Mastoids and visualized paranasal sinuses: The imaged paranasal sinuses are clear. The mastoid air cells are clear. Skeleton: There is no acute osseous abnormality or aggressive osseous lesion. Upper chest: Are patchy opacities in the lung apices, overall  slightly improved compared to the CTA chest from 04/12/2021. Other: None. IMPRESSION: 1. Prominent bilateral palatine tonsils which partially efface the oropharyngeal airway but without abnormal enhancement or mass lesion. No abnormal fluid collection. 2. Subcentimeter bilateral level II lymph nodes, nonspecific and may be reactive. No pathologic lymphadenopathy in the neck. 3. Patchy opacities in the lung apices likely reflecting infection, improved compared to the CTA chest from 04/12/2021. Electronically Signed   By: Valetta Mole M.D.   On: 04/21/2021 12:04   CT Angio Chest Pulmonary Embolism (PE) W or WO Contrast  Result Date: 04/12/2021 CLINICAL DATA:  Concern for pulmonary embolism. EXAM: CT ANGIOGRAPHY CHEST WITH CONTRAST TECHNIQUE: Multidetector CT imaging of the chest was performed using the standard protocol during bolus administration of intravenous contrast. Multiplanar CT image reconstructions and MIPs were obtained to evaluate the vascular anatomy. RADIATION DOSE REDUCTION: This exam was performed according to the departmental dose-optimization program which includes automated exposure control, adjustment  of the mA and/or kV according to patient size and/or use of iterative reconstruction technique. CONTRAST:  183mL OMNIPAQUE IOHEXOL 350 MG/ML SOLN COMPARISON:  CT dated 11/03/2020 and chest radiograph dated 04/12/2021. FINDINGS: Evaluation of this exam is limited due to respiratory motion artifact. Cardiovascular: Top-normal cardiac size. No pericardial effusion. The thoracic aorta is unremarkable. Evaluation of the pulmonary arteries is very limited due to severe respiratory motion artifact. No large or central pulmonary artery embolus identified. Mediastinum/Nodes: No hilar or mediastinal adenopathy. The esophagus is grossly unremarkable. No mediastinal fluid collection. Lungs/Pleura: Bilateral patchy airspace opacities most concerning for multilobar pneumonia. No pleural effusion pneumothorax.  The central airways are patent. Upper Abdomen: Severe fatty liver. Musculoskeletal: No chest wall abnormality. No acute or significant osseous findings. Review of the MIP images confirms the above findings. IMPRESSION: 1. No CT evidence of central pulmonary artery embolus. 2. Multifocal pneumonia. Clinical correlation and follow-up to resolution recommended. 3. Severe fatty liver. Electronically Signed   By: Anner Crete M.D.   On: 04/12/2021 02:21   MR BRAIN W WO CONTRAST  Result Date: 04/21/2021 CLINICAL DATA:  Numbness or tingling, paresthesia (Ped 0-17y) tongue paresthesias, lump in throat after covid EXAM: MRI HEAD WITHOUT AND WITH CONTRAST TECHNIQUE: Multiplanar, multiecho pulse sequences of the brain and surrounding structures were obtained without and with intravenous contrast. CONTRAST:  80mL GADAVIST GADOBUTROL 1 MMOL/ML IV SOLN COMPARISON:  12/20/2020 FINDINGS: Brain: There is no acute infarction or intracranial hemorrhage. There is no intracranial mass, mass effect, or edema. There is no hydrocephalus or extra-axial fluid collection. Ventricles and sulci are normal in size and configuration. No abnormal enhancement. Vascular: Major vessel flow voids at the skull base are preserved. Skull and upper cervical spine: Normal marrow signal is preserved. Sinuses/Orbits: Paranasal sinuses are aerated. Orbits are unremarkable. Other: Sella is unremarkable. Mastoid air cells are clear. Prominence of the palatine tonsils. IMPRESSION: No evidence of recent infarction, hemorrhage, or mass. No abnormal enhancement. Prominence of the palatine tonsils. Electronically Signed   By: Macy Mis M.D.   On: 04/21/2021 17:09   MR CERVICAL SPINE W WO CONTRAST  Result Date: 04/24/2021 EXAM: MRI CERVICAL SPINE WITHOUT AND WITH CONTRAST TECHNIQUE: Multiplanar and multiecho pulse sequences of the cervical spine, to include the craniocervical junction and cervicothoracic junction, were obtained without and with  intravenous contrast. CONTRAST:  28mL GADAVIST GADOBUTROL 1 MMOL/ML IV SOLN COMPARISON:  None. FINDINGS: Alignment: Physiologic. Vertebrae: No fracture, evidence of discitis, or bone lesion. Cord: Normal signal and morphology.  No abnormal enhancement. Posterior Fossa, vertebral arteries, paraspinal tissues: Negative. Disc levels: C2-3: No significant disc bulge. No neural foraminal stenosis. No central canal stenosis. C3-4: No significant disc bulge. No neural foraminal stenosis. No central canal stenosis. C4-5: No significant disc bulge. No neural foraminal stenosis. No central canal stenosis. C5-6: No significant disc bulge. No neural foraminal stenosis. No central canal stenosis. C6-7: No significant disc bulge. No neural foraminal stenosis. No central canal stenosis. C7-T1: No significant disc bulge. No neural foraminal stenosis. No central canal stenosis. IMPRESSION: Normal examination. Electronically Signed   By: Keane Police D.O.   On: 04/24/2021 17:06   DG Chest Port 1 View  Result Date: 04/12/2021 CLINICAL DATA:  Shortness of breath. EXAM: PORTABLE CHEST 1 VIEW COMPARISON:  Chest radiograph dated 01/02/2021. FINDINGS: Shallow inspiration. There is mild cardiomegaly with mild vascular congestion. Left upper lobe streaky densities may represent vascular congestion. Developing infiltrate is not excluded. Clinical correlation is recommended. No focal consolidation, pleural effusion, or  pneumothorax. No acute osseous pathology. IMPRESSION: 1. Mild cardiomegaly with mild vascular congestion. 2. Left upper lobe streaky densities may represent vascular congestion versus developing infiltrate. Electronically Signed   By: Anner Crete M.D.   On: 04/12/2021 00:38   US Abdomen Limited RUQ (LIVER/GB)  Result Date: 04/22/2021 CLINICAL DATA:  Abnormal liver function tests EXAM: ULTRASOUND ABDOMEN LIMITED RIGHT UPPER QUADRANT COMPARISON:  01/03/2021 FINDINGS: Gallbladder: There are hyperechoic foci in the  dependent portion of gallbladder suggesting gallbladder stones. There is no definite demonstrable intraluminal mobility in the submitted images. There is no wall thickening. Technologist did not observe any tenderness over the gallbladder. Common bile duct: Diameter: 3 mm Liver: There is increased echogenicity suggesting fatty infiltration. No focal abnormality is seen in the visualized portions of liver. Portal vein is patent on color Doppler imaging with normal direction of blood flow towards the liver. Other: None. IMPRESSION: Linear hyperechoic focus seen in the posterior margin of gallbladder may suggest layering of small stones or calcification in the gallbladder wall. There are no signs of acute cholecystitis. Fatty liver. Electronically Signed   By: Elmer Picker M.D.   On: 04/22/2021 12:12    Microbiology: Results for orders placed or performed during the hospital encounter of 04/21/21  Resp Panel by RT-PCR (Flu A&B, Covid) Nasopharyngeal Swab     Status: Abnormal   Collection Time: 04/21/21  2:13 PM   Specimen: Nasopharyngeal Swab; Nasopharyngeal(NP) swabs in vial transport medium  Result Value Ref Range Status   SARS Coronavirus 2 by RT PCR POSITIVE (A) NEGATIVE Final    Comment: (NOTE) SARS-CoV-2 target nucleic acids are DETECTED.  The SARS-CoV-2 RNA is generally detectable in upper respiratory specimens during the acute phase of infection. Positive results are indicative of the presence of the identified virus, but do not rule out bacterial infection or co-infection with other pathogens not detected by the test. Clinical correlation with patient history and other diagnostic information is necessary to determine patient infection status. The expected result is Negative.  Fact Sheet for Patients: EntrepreneurPulse.com.au  Fact Sheet for Healthcare Providers: IncredibleEmployment.be  This test is not yet approved or cleared by the [place] FDA and  has been authorized for detection and/or diagnosis of SARS-CoV-2 by FDA under an Emergency Use Authorization (EUA).  This EUA will remain in effect (meaning this test can be used) for the duration of  the COVID-19 declaration under Section 564(b)(1) of the A ct, 21 U.S.C. section 360bbb-3(b)(1), unless the authorization is terminated or revoked sooner.     Influenza A by PCR NEGATIVE NEGATIVE Final   Influenza B by PCR NEGATIVE NEGATIVE Final    Comment: (NOTE) The Xpert Xpress SARS-CoV-2/FLU/RSV plus assay is intended as an aid in the diagnosis of influenza from Nasopharyngeal swab specimens and should not be used as a sole basis for treatment. Nasal washings and aspirates are unacceptable for Xpert Xpress SARS-CoV-2/FLU/RSV testing.  Fact Sheet for Patients: EntrepreneurPulse.com.au  Fact Sheet for Healthcare Providers: IncredibleEmployment.be  This test is not yet approved or cleared by the [place] FDA and has been authorized for detection and/or diagnosis of SARS-CoV-2 by FDA under an Emergency Use Authorization (EUA). This EUA will remain in effect (meaning this test can be used) for the duration of the COVID-19 declaration under Section 564(b)(1) of the Act, 21 U.S.C. section 360bbb-3(b)(1), unless the authorization is terminated or revoked.  Performed at [facility], [address], [place], [place] [address]     Labs: CBC: Recent Labs  Lab 04/25/21 0533 04/26/21 0531  WBC 12.3* 12.7*  NEUTROABS  --  8.3*  HGB 8.7* 8.7*  HCT 29.8* 29.5*  MCV 92.5 91.6  PLT 263 [id number]   Basic Metabolic Panel: Recent Labs  Lab 04/25/21 0533 04/26/21 0531  NA 135 133*  K 3.5 3.7  CL 99 100  CO2 27 26  GLUCOSE 76 68*  BUN 13 9  CREATININE <0.30* <0.30*  CALCIUM 8.4* 8.7*  MG  --  1.8   Liver Function Tests: Recent Labs  Lab 04/25/21 0533 04/26/21 0531 04/27/21 0604 04/28/21 0524  AST 434* 313* 378* 493*   ALT 218* 191* 189* 225*  ALKPHOS 83 77 88 99  BILITOT 4.6* 3.5* 2.7* 3.0*  PROT 6.4* 6.9 7.5 7.9  ALBUMIN 2.6* 2.6* 2.5* 2.5*   CBG: Recent Labs  Lab 04/27/21 2137 04/28/21 0753 04/28/21 1114 04/28/21 1601 04/28/21 2215  GLUCAP 114* 78 110* 121* 110*    Discharge time spent: greater than 30 minutes.  Signed: Barton Dubois, MD [facility] 05/01/2021

## 2021-05-02 ENCOUNTER — Other Ambulatory Visit (INDEPENDENT_AMBULATORY_CARE_PROVIDER_SITE_OTHER): Payer: Self-pay | Admitting: Internal Medicine

## 2021-05-02 LAB — COPPER, URINE - RANDOM OR 24 HOUR
Copper / Creatinine Ratio: 56 ug/g creat — ABNORMAL HIGH (ref 0–49)
Copper, 24H Ur: 43 ug/24 hr — ABNORMAL HIGH (ref 3–35)
Copper, Ur: 33 ug/L
Creatinine(Crt),U: 0.59 g/L (ref 0.30–3.00)
Total Volume: 1300

## 2021-05-03 ENCOUNTER — Other Ambulatory Visit (INDEPENDENT_AMBULATORY_CARE_PROVIDER_SITE_OTHER): Payer: Self-pay | Admitting: *Deleted

## 2021-05-03 DIAGNOSIS — R7989 Other specified abnormal findings of blood chemistry: Secondary | ICD-10-CM

## 2021-05-03 DIAGNOSIS — R894 Abnormal immunological findings in specimens from other organs, systems and tissues: Secondary | ICD-10-CM

## 2021-05-11 NOTE — Progress Notes (Signed)
Called [facility] today and spoke to Loletta Parish. And asked if bw had been done and if eye dr appt had been made. Was told she would check on this and call me back. Await call back.

## 2021-05-11 NOTE — Progress Notes (Signed)
Called [facility] today and spoke to Marie Anderson. And asked if bw had been done and if eye dr appt had been made. Was told she would check on this and call me back. Await call back.  °

## 2021-05-15 NOTE — Progress Notes (Signed)
Called and asked to speak to DON. Was transferred to a voicemail. Left message that we have called multiple times and still do not have results of bloodwork and also asking if appt was made to eye dr to check KF rings.

## 2021-05-22 NOTE — Progress Notes (Signed)
Labs were done and faxed over. Will give to Dr.Rehman tomorrow when he is in office to review.

## 2021-05-24 ENCOUNTER — Other Ambulatory Visit (INDEPENDENT_AMBULATORY_CARE_PROVIDER_SITE_OTHER): Payer: Self-pay | Admitting: *Deleted

## 2021-05-24 ENCOUNTER — Other Ambulatory Visit: Payer: Self-pay | Admitting: *Deleted

## 2021-05-24 DIAGNOSIS — R899 Unspecified abnormal finding in specimens from other organs, systems and tissues: Secondary | ICD-10-CM

## 2021-06-08 ENCOUNTER — Other Ambulatory Visit (INDEPENDENT_AMBULATORY_CARE_PROVIDER_SITE_OTHER): Payer: Self-pay | Admitting: *Deleted

## 2021-06-21 ENCOUNTER — Telehealth (INDEPENDENT_AMBULATORY_CARE_PROVIDER_SITE_OTHER): Payer: Self-pay | Admitting: *Deleted

## 2021-06-21 NOTE — Telephone Encounter (Signed)
ERROR

## 2021-07-04 ENCOUNTER — Institutional Professional Consult (permissible substitution): Payer: Medicaid Other | Admitting: Pulmonary Disease

## 2021-07-05 ENCOUNTER — Other Ambulatory Visit (INDEPENDENT_AMBULATORY_CARE_PROVIDER_SITE_OTHER): Payer: Self-pay | Admitting: *Deleted

## 2021-07-05 DIAGNOSIS — R7989 Other specified abnormal findings of blood chemistry: Secondary | ICD-10-CM

## 2021-08-11 ENCOUNTER — Other Ambulatory Visit (INDEPENDENT_AMBULATORY_CARE_PROVIDER_SITE_OTHER): Payer: Self-pay | Admitting: *Deleted

## 2021-08-11 DIAGNOSIS — R7989 Other specified abnormal findings of blood chemistry: Secondary | ICD-10-CM

## 2021-10-26 ENCOUNTER — Ambulatory Visit (INDEPENDENT_AMBULATORY_CARE_PROVIDER_SITE_OTHER): Payer: Medicaid Other | Admitting: Gastroenterology

## 2021-10-27 ENCOUNTER — Telehealth (INDEPENDENT_AMBULATORY_CARE_PROVIDER_SITE_OTHER): Payer: Self-pay | Admitting: Gastroenterology

## 2021-10-27 NOTE — Telephone Encounter (Signed)
I received a fax from the most recent hepatic panel performed on 09/17/2021 which showed an albumin of 2.9, direct bilirubin of 0.4, total bilirubin of 1.0, alkaline phosphatase 142, ALT of 62 and AST of 308.  Notably, the patient was scheduled for a follow-up in our clinic yesterday but she canceled her appointment and did not reschedule any follow-up appointments.  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

## 2021-10-30 ENCOUNTER — Encounter (INDEPENDENT_AMBULATORY_CARE_PROVIDER_SITE_OTHER): Payer: Self-pay

## 2021-11-14 ENCOUNTER — Other Ambulatory Visit (INDEPENDENT_AMBULATORY_CARE_PROVIDER_SITE_OTHER): Payer: Self-pay | Admitting: *Deleted

## 2021-11-14 ENCOUNTER — Telehealth (INDEPENDENT_AMBULATORY_CARE_PROVIDER_SITE_OTHER): Payer: Self-pay | Admitting: *Deleted

## 2021-11-14 DIAGNOSIS — R7989 Other specified abnormal findings of blood chemistry: Secondary | ICD-10-CM

## 2021-11-14 NOTE — Telephone Encounter (Signed)
Patient in reminder file for Lft's around 11/17/21. Order faxed to [facility] with a note to do around 11/17/21.

## 2021-11-28 ENCOUNTER — Telehealth (INDEPENDENT_AMBULATORY_CARE_PROVIDER_SITE_OTHER): Payer: Self-pay | Admitting: *Deleted

## 2021-11-28 NOTE — Telephone Encounter (Signed)
Has follow up on 01/16/22

## 2021-11-28 NOTE — Telephone Encounter (Signed)
Patient in reminder file to have liver profile around 8/25. Results on your desk for review.

## 2022-01-16 ENCOUNTER — Ambulatory Visit (INDEPENDENT_AMBULATORY_CARE_PROVIDER_SITE_OTHER): Payer: Medicaid Other | Admitting: Gastroenterology

## 2022-01-17 ENCOUNTER — Ambulatory Visit: Payer: Medicaid Other | Admitting: Orthopaedic Surgery

## 2022-02-08 ENCOUNTER — Ambulatory Visit (INDEPENDENT_AMBULATORY_CARE_PROVIDER_SITE_OTHER): Payer: Medicaid Other | Admitting: Orthopaedic Surgery

## 2022-02-08 ENCOUNTER — Encounter: Payer: Self-pay | Admitting: Orthopaedic Surgery

## 2022-02-08 ENCOUNTER — Ambulatory Visit (INDEPENDENT_AMBULATORY_CARE_PROVIDER_SITE_OTHER): Payer: Medicaid Other

## 2022-02-08 VITALS — Ht 64.0 in | Wt 334.0 lb

## 2022-02-08 DIAGNOSIS — G8929 Other chronic pain: Secondary | ICD-10-CM

## 2022-02-08 DIAGNOSIS — S83512A Sprain of anterior cruciate ligament of left knee, initial encounter: Secondary | ICD-10-CM

## 2022-02-08 DIAGNOSIS — M25562 Pain in left knee: Secondary | ICD-10-CM

## 2022-02-08 NOTE — Progress Notes (Signed)
The patient is a 31 year old female that actually saw over a year ago for a left knee dislocation.  She had an acute fall and dislocated her knee and this was reduced by the ER staff.  At the time her weight was over 375 pounds and we were considering external fixation but then went for just a Bledsoe knee brace.  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  She said she had weight close to 400 pounds and now today she is down to 334 pounds.  She is still in facility working on her rehabilitation.  She says her knee does pop on her and can feel unstable at times.  She does not wear any type of brace at this standpoint.  Examination of both knees she has a slightly hyperextended.  There is some laxity when comparing her left knee can to her right knee.  2 views of the knee show that the knee is well located.  The bone is somewhat osteopenic and I believe this is due to disuse for when she was dealing with the severe aspects of her Guillain-Barr syndrome.  She is mobilizing better and showed me some videos of her rehabilitating with mobility and ambulating.  I did give her prescription to see if a prosthetics company can design a custom ACL brace for her given her body habitus for that left knee.  We can then see her back in 3 months to see how she is doing from a recovery standpoint from [place] before having one of my partners who performs ligamentous reconstruction at least evaluate her knee.  She agrees with this treatment plan.  All question concerns were answered and addressed.  No x-rays needed in 3 months.

## 2022-03-09 ENCOUNTER — Emergency Department (HOSPITAL_COMMUNITY): Payer: Medicaid Other

## 2022-03-09 ENCOUNTER — Other Ambulatory Visit: Payer: Self-pay

## 2022-03-09 ENCOUNTER — Encounter (HOSPITAL_COMMUNITY): Payer: Self-pay

## 2022-03-09 ENCOUNTER — Emergency Department (HOSPITAL_COMMUNITY)
Admission: EM | Admit: 2022-03-09 | Discharge: 2022-03-10 | Disposition: A | Discharge status: Skilled Nursing Facility | Payer: Medicaid Other | Attending: Emergency Medicine | Admitting: Emergency Medicine

## 2022-03-09 DIAGNOSIS — W19XXXA Unspecified fall, initial encounter: Secondary | ICD-10-CM

## 2022-03-09 DIAGNOSIS — W182XXA Fall in (into) shower or empty bathtub, initial encounter: Secondary | ICD-10-CM | POA: Diagnosis not present

## 2022-03-09 DIAGNOSIS — S92155A Nondisplaced avulsion fracture (chip fracture) of left talus, initial encounter for closed fracture: Secondary | ICD-10-CM | POA: Insufficient documentation

## 2022-03-09 DIAGNOSIS — Y92002 Bathroom of unspecified non-institutional (private) residence single-family (private) house as the place of occurrence of the external cause: Secondary | ICD-10-CM | POA: Insufficient documentation

## 2022-03-09 DIAGNOSIS — S99922A Unspecified injury of left foot, initial encounter: Secondary | ICD-10-CM | POA: Diagnosis present

## 2022-03-09 DIAGNOSIS — T148XXA Other injury of unspecified body region, initial encounter: Secondary | ICD-10-CM

## 2022-03-09 MED ORDER — HYDROMORPHONE HCL 1 MG/ML IJ SOLN: 1.0000 mg | Freq: Once | INTRAMUSCULAR | Status: DC

## 2022-03-09 MED ORDER — TRAMADOL HCL 50 MG PO TABS: 50.0000 mg | ORAL_TABLET | Freq: Four times a day (QID) | ORAL | 0 refills | Status: AC | PRN

## 2022-03-09 MED ORDER — HYDROMORPHONE HCL 1 MG/ML IJ SOLN
1.0000 mg | Freq: Once | INTRAMUSCULAR | Status: AC
  Administered 2022-03-09: 1 mg via INTRAMUSCULAR
  Filled 2022-03-09: qty 1

## 2022-03-09 MED ORDER — HYDROMORPHONE HCL 1 MG/ML IJ SOLN: 0.5000 mg | Freq: Once | INTRAMUSCULAR | Status: DC

## 2022-03-09 MED ORDER — POTASSIUM CHLORIDE CRYS ER 10 MEQ PO TBCR: 10.0000 meq | EXTENDED_RELEASE_TABLET | Freq: Two times a day (BID) | ORAL | 0 refills | Status: DC

## 2022-03-09 NOTE — Discharge Instructions (Addendum)
Pain medication as needed.  Follow up with Orthopaedist for recheck in 1 week

## 2022-03-09 NOTE — ED Triage Notes (Signed)
BIB EMS from [facility] after patient was in mechanical lift and it turned over on side. Reports that she landed on ankle. Pain 10/10. EMS has ankle splinted. Swelling and deformity noted.

## 2022-03-09 NOTE — ED Notes (Signed)
Pt a/w convo transport back to SNF- pt sleeping at this time.

## 2022-03-09 NOTE — ED Notes (Signed)
Informed Angelena, ED sec that pt will need EMS transport back to [facility] as pt is nonambulatory

## 2022-03-10 NOTE — ED Notes (Signed)
EMS on way to transport pt back to SNF

## 2022-03-10 NOTE — ED Provider Notes (Signed)
[facility] Provider Note   CSN: [id number] Arrival date & time: 03/09/22  1440     History  Chief Complaint  Patient presents with   Carla Little is a 31 y.o. female.  Patient reports that she has a history of Guillain-Barr.  Patient requires assistance with showering.  Patient reports she was being lifted out of the shower with a [employer] lift and the lift fell over.  Patient reports she struck her foot and heel.  Patient complains of swelling and pain.  The history is provided by the patient. No language interpreter was used.  Fall This is a new problem. The problem occurs constantly. The problem has been gradually worsening. Nothing aggravates the symptoms. Nothing relieves the symptoms. She has tried nothing for the symptoms.       Home Medications Prior to Admission medications   Medication Sig Start Date End Date Taking? Authorizing Provider  potassium chloride (KLOR-CON M) 10 MEQ tablet Take 1 tablet (10 mEq total) by mouth 2 (two) times daily. 03/09/22  Yes Caryl Ada K, PA-C  traMADol (ULTRAM) 50 MG tablet Take 1 tablet (50 mg total) by mouth every 6 (six) hours as needed. 03/09/22 03/09/23 Yes Fransico Meadow, PA-C  acetaminophen (TYLENOL) 325 MG tablet Take 2 tablets (650 mg total) by mouth every 6 (six) hours as needed for mild pain (or Fever >/= 101). 01/26/21   Jennye Boroughs, MD  albuterol (VENTOLIN HFA) 108 (90 Base) MCG/ACT inhaler Inhale 1 puff into the lungs every 6 (six) hours as needed for wheezing or shortness of breath. 04/17/21   Kathie Dike, MD  ascorbic acid (VITAMIN C) 500 MG tablet Take 500 mg by mouth daily.    [provider]  buPROPion (WELLBUTRIN SR) 150 MG 12 hr tablet Take 150 mg by mouth daily.    [provider]  busPIRone (BUSPAR) 5 MG tablet Take 1 tablet (5 mg total) by mouth 2 (two) times daily. 01/26/21   Jennye Boroughs, MD  cholecalciferol (VITAMIN D) 25 MCG tablet Take 2 tablets  (2,000 Units total) by mouth daily. Patient taking differently: Take 1,000 Units by mouth daily. 11/28/20   Shawna Clamp, MD  cyanocobalamin (,VITAMIN B-12,) 1000 MCG/ML injection Inject 1 mL (1,000 mcg total) into the muscle every 30 (thirty) days. 01/29/21   Jennye Boroughs, MD  folic acid (FOLVITE) 1 MG tablet Take 1 tablet (1 mg total) by mouth daily. 01/27/21   Jennye Boroughs, MD  guaiFENesin-dextromethorphan (ROBITUSSIN DM) 100-10 MG/5ML syrup Take 10 mLs by mouth every 4 (four) hours as needed for cough. 04/17/21   Kathie Dike, MD  hydrocortisone (ANUSOL-HC) 25 MG suppository Place 1 suppository (25 mg total) rectally 2 (two) times daily. 04/17/21   Kathie Dike, MD  hydrOXYzine (ATARAX) 25 MG tablet Take 1 tablet (25 mg total) by mouth every 8 (eight) hours as needed for anxiety. 04/28/21   Barton Dubois, MD  linaclotide [facility]) 290 MCG CAPS capsule Take 1 capsule (290 mcg total) by mouth daily before breakfast. 04/18/21   Kathie Dike, MD  melatonin 5 MG TABS Take 10 mg by mouth at bedtime.    [provider]  methocarbamol (ROBAXIN) 500 MG tablet Take 1 tablet (500 mg total) by mouth every 6 (six) hours as needed for muscle spasms. 11/28/20   Shawna Clamp, MD  metoprolol tartrate (LOPRESSOR) 25 MG tablet Take 0.5 tablets (12.5 mg total) by mouth 2 (two) times daily. 04/17/21   Kathie Dike,  MD  Nystatin (GERHARDT'S BUTT CREAM) CREA Apply 1 application topically as needed for irritation. 04/17/21   Kathie Dike, MD  ondansetron (ZOFRAN-ODT) 4 MG disintegrating tablet Take 1 tablet (4 mg total) by mouth every 8 (eight) hours as needed for nausea or vomiting. 04/28/21   Barton Dubois, MD  oxyCODONE (OXY IR/ROXICODONE) 5 MG immediate release tablet Take 1 tablet (5 mg total) by mouth every 12 (twelve) hours as needed for severe pain. 04/28/21   Barton Dubois, MD  pantoprazole (PROTONIX) 40 MG tablet Take 1 tablet (40 mg total) by mouth 2 (two) times daily. 04/28/21   Barton Dubois,  MD  polyethylene glycol (MIRALAX / GLYCOLAX) 17 g packet Take 17 g by mouth 2 (two) times daily. 04/17/21   Kathie Dike, MD  predniSONE (DELTASONE) 10 MG tablet Take 1 tablet (10 mg total) by mouth daily with breakfast. 04/29/21   Barton Dubois, MD  pregabalin (LYRICA) 75 MG capsule Take 1 capsule (75 mg total) by mouth 3 (three) times daily. 01/26/21   Jennye Boroughs, MD  senna-docusate (SENOKOT-S) 8.6-50 MG tablet Take 1 tablet by mouth at bedtime as needed for mild constipation. 01/26/21   Jennye Boroughs, MD  vitamin B-12 (CYANOCOBALAMIN) 500 MCG tablet Take 1 tablet (500 mcg total) by mouth daily. 04/29/21   Barton Dubois, MD  Zinc 220 (50 Zn) MG CAPS Take 1 capsule by mouth daily at 6 (six) AM. For 14 days due to covid    [provider]      Allergies    Azithromycin    Review of Systems   Review of Systems  All other systems reviewed and are negative.   Physical Exam Updated Vital Signs BP 96/61 (BP Location: Right Arm)   Pulse 78   Temp 97.9 F (36.6 C) (Oral)   Resp 17   Ht 5' 4"  (1.626 m)   Wt (!) 151.4 kg   SpO2 97%   BMI 57.29 kg/m  Physical Exam Vitals and nursing note reviewed.  Constitutional:      Appearance: She is well-developed.  HENT:     Head: Normocephalic.  Pulmonary:     Effort: Pulmonary effort is normal.  Abdominal:     General: There is no distension.  Musculoskeletal:        General: Swelling and tenderness present.     Cervical back: Normal range of motion.     Comments: Foot swollen tender neurovascular neurosensory are intact  Neurological:     Mental Status: She is alert and oriented to person, place, and time.     ED Results / Procedures / Treatments   Labs (all labs ordered are listed, but only abnormal results are displayed) Labs Reviewed - No data to display  EKG None  Radiology CT Foot Left Wo Contrast  Result Date: 03/09/2022 CLINICAL DATA:  Fracture, foot EXAM: CT OF THE LEFT FOOT WITHOUT CONTRAST TECHNIQUE:  Multidetector CT imaging of the left foot was performed according to the standard protocol. Multiplanar CT image reconstructions were also generated. RADIATION DOSE REDUCTION: This exam was performed according to the departmental dose-optimization program which includes automated exposure control, adjustment of the mA and/or kV according to patient size and/or use of iterative reconstruction technique. COMPARISON:  X-ray left foot 03/09/2022 FINDINGS: Bones/Joint/Cartilage Diffusely decreased bone density. Acute avulsion fracture of the talar neck. Acute avulsion fracture of the navicular. No dislocation. Ligaments Suboptimally assessed by CT. Muscles and Tendons Grossly unremarkable. Soft tissues Mild Subcutaneus soft tissue edema. IMPRESSION: 1. Acute  avulsion fracture of the talar neck. 2. Acute avulsion fracture of the navicular. 3. Diffusely decreased bone density. Electronically Signed   By: Iven Finn M.D.   On: 03/09/2022 19:54   DG Ankle Complete Left  Result Date: 03/09/2022 CLINICAL DATA:  Patient was riding on mechanical left when overturned landing on left ankle EXAM: LEFT FOOT - COMPLETE 3 VIEW; LEFT ANKLE COMPLETE - 3 VIEW COMPARISON:  Left foot radiographs dated 10/31/2020 FINDINGS: Curvilinear ossific fragments projecting over the lateral distal calcaneus and dorsal to the talus and navicular are new compared to 10/31/2020. No joint effusion. Diffusely heterogeneous appearance of the marrow. Ankle mortise is intact. Soft tissues are unremarkable. IMPRESSION: 1. Curvilinear ossific fragments projecting over the lateral distal calcaneus and dorsal to the talus and navicular are new compared to 10/31/2020 and are suspicious for avulsion fractures. 2. Diffusely heterogeneous appearance of the marrow, nonspecific. Electronically Signed   By: Darrin Nipper M.D.   On: 03/09/2022 15:35   DG Foot Complete Left  Result Date: 03/09/2022 CLINICAL DATA:  Patient was riding on mechanical left when  overturned landing on left ankle EXAM: LEFT FOOT - COMPLETE 3 VIEW; LEFT ANKLE COMPLETE - 3 VIEW COMPARISON:  Left foot radiographs dated 10/31/2020 FINDINGS: Curvilinear ossific fragments projecting over the lateral distal calcaneus and dorsal to the talus and navicular are new compared to 10/31/2020. No joint effusion. Diffusely heterogeneous appearance of the marrow. Ankle mortise is intact. Soft tissues are unremarkable. IMPRESSION: 1. Curvilinear ossific fragments projecting over the lateral distal calcaneus and dorsal to the talus and navicular are new compared to 10/31/2020 and are suspicious for avulsion fractures. 2. Diffusely heterogeneous appearance of the marrow, nonspecific. Electronically Signed   By: Darrin Nipper M.D.   On: 03/09/2022 15:35    Procedures Procedures    Medications Ordered in ED Medications  HYDROmorphone (DILAUDID) injection 1 mg (1 mg Intramuscular Given 03/09/22 2053)    ED Course/ Medical Decision Making/ A&P                           Medical Decision Making Patient reports a fall when a [employer]oyer lift tipped over  Amount and/or Complexity of Data Reviewed Independent Historian: EMS    Details: She was brought to the emergency department by EMS External Data Reviewed: notes.    Details: Hospitalist notes reviewed Radiology: ordered and independent interpretation performed. Decision-making details documented in ED Course.    Details: X-ray shows ossification over navicular and talus, CT scan of foot shows avulsion fracture.  Risk Prescription drug management. Risk Details: Patient is given an injection of Dilaudid for pain she has been placed in a cam walker.  Patient is advised to follow-up with orthopedist for recheck.  Patient is advised ice and elevation  This over the lateral distal calcaneus and navicular         Final Clinical Impression(s) / ED Diagnoses Final diagnoses:  Fall, initial encounter  Avulsion fracture    Rx / DC Orders ED  Discharge Orders          Ordered    potassium chloride (KLOR-CON M) 10 MEQ tablet  2 times daily,   Status:  Discontinued        03/09/22 2027    traMADol (ULTRAM) 50 MG tablet  Every 6 hours PRN        03/09/22 2027          An After Visit Summary was printed and  given to the patient.     Fransico Meadow, PA-C 03/10/22 0019    Fransico Meadow, MD 03/12/22 [phone]

## 2022-03-15 ENCOUNTER — Ambulatory Visit (INDEPENDENT_AMBULATORY_CARE_PROVIDER_SITE_OTHER): Payer: Medicaid Other | Admitting: Gastroenterology

## 2022-03-23 ENCOUNTER — Ambulatory Visit: Payer: Medicaid Other | Admitting: Physician Assistant

## 2022-03-27 ENCOUNTER — Ambulatory Visit (INDEPENDENT_AMBULATORY_CARE_PROVIDER_SITE_OTHER): Payer: Medicaid Other | Admitting: Physician Assistant

## 2022-03-27 ENCOUNTER — Encounter: Payer: Self-pay | Admitting: Physician Assistant

## 2022-03-27 DIAGNOSIS — M25572 Pain in left ankle and joints of left foot: Secondary | ICD-10-CM | POA: Diagnosis not present

## 2022-03-27 NOTE — Progress Notes (Signed)
Office Visit Note   Patient: Carla Little           Date of Birth: October 02, 1990           MRN: [id number] Visit Date: 03/27/2022              Requested by: Hal Morales, DO [place],  [place] [address] PCP: Hal Morales, DO  Chief Complaint  Patient presents with   Left Foot - Follow-up      HPI: Carla Little is a pleasant 32 year old woman with a 3-week history of left foot pain.  She has a very complicated medical history including a recent history of Gilliam Barr syndrome with neuropathy.  She is also status post left knee dislocation and has been followed by Dr. Ninfa Linden.  Unfortunately a few weeks ago she was being lifted by [employer] lift at her nursing facility.  The [facility] lift failed and she came down onto her left foot and ankle.  She was seen and evaluated the emergency room with both CT and regular x-rays.  Did show some acute avulsion fractures of her navicular and talus also significant disuse osteopenia.  She has been in a boot but has been permitted to weight-bear.  She is obviously disappointed because she said just prior to this she was beginning to weight-bear with a walker and crutches  Assessment & Plan: Visit Diagnoses: No diagnosis found.  Plan: I think she could try some weightbearing as long as she is in the boot.  She has a follow-up scheduled with Dr. Ninfa Linden to go and revisit her knee.  Unfortunately she has not received her knee brace just because the facility has not yet approved it and per her report there pain 4.  Can also follow-up with on the foot with Dr. Ninfa Linden at that appointment  Follow-Up Instructions: Return in about 1 month (around 04/27/2022).   Ortho Exam  Patient is alert, oriented, no adenopathy, well-dressed, normal affect, normal respiratory effort. Examination of her foot she has a palpable dorsalis pedis pulse she has weak dorsiflexion but has good plantarflexion she does have pain with inversion eversion.   Mostly tender over the navicular.  No real tenderness over the Lisfranc complex.  Compartments are soft and nontender  Imaging: No results found. No images are attached to the encounter.  Labs: Lab Results  Component Value Date   HGBA1C 5.0 11/02/2020   ESRSEDRATE 90 (H) 01/08/2021   CRP 3.5 (H) 04/28/2021   CRP 3.8 (H) 04/27/2021   CRP 4.5 (H) 04/26/2021   REPTSTATUS 04/18/2021 FINAL 04/15/2021   GRAMSTAIN  12/21/2020    WBC PRESENT,BOTH PMN AND MONONUCLEAR NO ORGANISMS SEEN Gram Stain Report Called to,Read Back By and Verified With: C.Hulda Humphrey, RN AT 1418 ON 09.29.22 BY N.THOMPSON Performed at [facility] [address] [place], [place] [address]    CULT (A) 04/15/2021    >=100,000 COLONIES/mL MULTIPLE SPECIES PRESENT, SUGGEST RECOLLECTION   LABORGA ESCHERICHIA COLI (A) 12/11/2020   LABORGA PROTEUS MIRABILIS (A) 12/11/2020     Lab Results  Component Value Date   ALBUMIN 2.5 (L) 04/28/2021   ALBUMIN 2.5 (L) 04/27/2021   ALBUMIN 2.6 (L) 04/26/2021    Lab Results  Component Value Date   MG 1.8 04/26/2021   MG 1.8 04/22/2021   MG 1.9 04/13/2021   Lab Results  Component Value Date   VD25OH 23.62 (L) 11/02/2020    No results found for: "PREALBUMIN"    Latest Ref Rng &  Units 04/26/2021    5:31 AM 04/25/2021    5:33 AM 04/24/2021    4:30 AM  CBC EXTENDED  WBC 4.0 - 10.5 K/uL 12.7  12.3  11.3   RBC 3.87 - 5.11 MIL/uL 3.22  3.22  3.50   Hemoglobin 12.0 - 15.0 g/dL 8.7  8.7  9.4   HCT 36.0 - 46.0 % 29.5  29.8  30.5   Platelets 150 - 400 K/uL 287  263  325   NEUT# 1.7 - 7.7 K/uL 8.3     Lymph# 0.7 - 4.0 K/uL 3.0        There is no height or weight on file to calculate BMI.  Orders:  No orders of the defined types were placed in this encounter.  No orders of the defined types were placed in this encounter.    Procedures: No procedures performed  Clinical Data: No additional findings.  ROS:  All other systems negative, except as noted  in the HPI. Review of Systems  Objective: Vital Signs: There were no vitals taken for this visit.  Specialty Comments:  No specialty comments available.  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 coli UTI    Intractable vomiting with nausea 12/09/2020   PUD (peptic ulcer disease) 58/11/9831   Nonalcoholic steatohepatitis ([facility]) 12/09/2020   Class 3 obesity ([place]) 12/09/2020   Sinus tachycardia 12/09/2020   Hypokalemia 12/09/2020   GERD (gastroesophageal reflux disease)    Depression    Iron deficiency anemia due to chronic blood loss    Pressure injury of skin 11/21/2020   Left knee dislocation 11/01/2020   Knee dislocation, left, initial encounter 11/01/2020   Past Medical History:  Diagnosis Date   Class 3 obesity ([place]) 12/09/2020   Depression    GERD (gastroesophageal reflux disease)    Guillain Barr syndrome ([place])    Nonalcoholic steatohepatitis ([facility]) 12/09/2020   Obesity    PUD (peptic ulcer disease) 12/09/2020    Family History  Problem Relation Age of Onset   Hypertension Other    Colon cancer Maternal Aunt    Colon polyps Neg Hx     Past Surgical History:  Procedure Laterality Date   BIOPSY  11/19/2020   Procedure: BIOPSY;  Surgeon: Juanita Craver, MD;  Location: [facility];  Service: Endoscopy;;   ESOPHAGOGASTRODUODENOSCOPY N/A 01/10/2021   normal   ESOPHAGOGASTRODUODENOSCOPY (EGD) WITH PROPOFOL N/A 11/19/2020   LA Grade B reflux esophagitis, one small non-bleeding gastric ulcer in antrum s/p biopsy. Negative h.pylori.   FLEXIBLE SIGMOIDOSCOPY N/A 01/10/2021   Colonoscopy attempted Oct  2022 by Dr. Alessandra Bevels but prep was poor. Stool in rectum and rectosigmoid colon.   Social History   Occupational History   Not on file  Tobacco Use   Smoking status: Never   Smokeless tobacco: Never  Vaping Use   Vaping Use: Never used  Substance and Sexual Activity   Alcohol use: Not Currently   Drug use: No   Sexual activity: Not on file

## 2022-05-16 ENCOUNTER — Encounter: Payer: Self-pay | Admitting: Orthopaedic Surgery

## 2022-05-16 ENCOUNTER — Ambulatory Visit (INDEPENDENT_AMBULATORY_CARE_PROVIDER_SITE_OTHER): Payer: Medicaid Other | Admitting: Orthopaedic Surgery

## 2022-05-16 DIAGNOSIS — G8929 Other chronic pain: Secondary | ICD-10-CM

## 2022-05-16 DIAGNOSIS — M25572 Pain in left ankle and joints of left foot: Secondary | ICD-10-CM

## 2022-05-16 DIAGNOSIS — M25562 Pain in left knee: Secondary | ICD-10-CM

## 2022-05-16 NOTE — Progress Notes (Signed)
The patient is well-known to me.  She is a 32 year old female who has remote history of a traumatic dislocation of her left left knee that occurred after mechanical fall.  She is someone who is morbidly obese.  She disrupted the ligaments of her knee and had a dislocation that was reduced.  Prolonged time she was in a hinged knee brace.  She then unfortunately later developed Guillain-Barr syndrome and had been in a skilled nursing facility.  Back in December she had a small mechanical fall and felt a pop in her foot and ankle.  She was found to have just a small avulsion fracture of the talus and the navicular and was placed in a cam walking boot.  She is able to walk in the boot and said she is not really having any pain at all now with her foot and ankle.  On examination of her left foot and ankle there is no pain with full range of motion in the ankle feels ligamentously stable.  Both knees have laxity to them but obviously her left knee is more ligamentously lax.  She is waiting to be approved for an ACL brace that can fit her body habitus for her left knee.  She can stop her cam walker at this point and can wear just regular shoes.  She wants to hold off on any type of surgical intervention given her recovery from [place].  I would like to have my partner Dr. Sammuel Hines take a look at her about 3 months from now to see what he thinks about her left knee and her ligamentous instability.

## 2022-05-17 ENCOUNTER — Telehealth: Payer: Self-pay | Admitting: Orthopaedic Surgery

## 2022-05-17 NOTE — Telephone Encounter (Signed)
Pt called requesting a call from Genia Del or Autumn H. Pt states Dr Ninfa Linden sent a referral fpr her for ACL brace back in Nov and pt states the facility told her Dr Ninfa Linden to to send new referral to [facility]. Pt has questions. Please call pt at [phone].

## 2022-05-17 NOTE — Telephone Encounter (Signed)
New rx faxed and pt was informed

## 2022-06-08 ENCOUNTER — Telehealth: Payer: Self-pay | Admitting: Orthopaedic Surgery

## 2022-06-08 DIAGNOSIS — G8929 Other chronic pain: Secondary | ICD-10-CM

## 2022-06-08 DIAGNOSIS — S83512A Sprain of anterior cruciate ligament of left knee, initial encounter: Secondary | ICD-10-CM

## 2022-06-08 NOTE — Telephone Encounter (Signed)
Patient asking for an order for P/T when her brace comes in. Please advise

## 2022-06-11 NOTE — Telephone Encounter (Signed)
Called pt. She needs rx faxed to her facility. She will cb with the fax number

## 2022-06-12 ENCOUNTER — Telehealth: Payer: Self-pay | Admitting: Orthopaedic Surgery

## 2022-06-12 NOTE — Telephone Encounter (Signed)
Patient states she is at a rehab facility and they need the P/T order please fax to  [phone]

## 2022-06-12 NOTE — Telephone Encounter (Signed)
Referral faxed to number given 

## 2022-06-12 NOTE — Addendum Note (Signed)
Addended by: Robyne Peers on: 06/12/2022 02:35 PM   Modules accepted: Orders

## 2022-07-06 ENCOUNTER — Telehealth: Payer: Self-pay | Admitting: Orthopaedic Surgery

## 2022-07-06 NOTE — Telephone Encounter (Signed)
[facility] stated they did not get the order for PT please advise make sure we are sending it to Fax number [phone]

## 2022-07-09 NOTE — Telephone Encounter (Signed)
Refaxed as 2nd request

## 2022-08-15 ENCOUNTER — Ambulatory Visit (HOSPITAL_BASED_OUTPATIENT_CLINIC_OR_DEPARTMENT_OTHER): Payer: Medicaid Other | Admitting: Orthopaedic Surgery

## 2022-11-07 ENCOUNTER — Ambulatory Visit (HOSPITAL_BASED_OUTPATIENT_CLINIC_OR_DEPARTMENT_OTHER): Payer: Medicaid Other | Admitting: Orthopaedic Surgery

## 2023-01-02 ENCOUNTER — Ambulatory Visit (INDEPENDENT_AMBULATORY_CARE_PROVIDER_SITE_OTHER): Payer: Medicaid Other | Admitting: Orthopaedic Surgery

## 2023-01-02 DIAGNOSIS — S83512A Sprain of anterior cruciate ligament of left knee, initial encounter: Secondary | ICD-10-CM

## 2023-01-02 NOTE — Progress Notes (Signed)
Chief Complaint: Left knee instability     History of Present Illness:    Carla Little is a 32 y.o. female presents as a referral from a partner Dr. Magnus Ivan for ongoing left knee issues.  She does not have any pain in the left knee but does occasionally feel like giving out.  She is status post left knee dislocation in 2022 which was subsequently closed reduced and treated with Dr. Magnus Ivan.  She has subsequently developed Guillain-Barr syndrome and as result has had to essentially relearn how to walk.  She has recently obtained a stability brace for this left knee.  She states that she is now walking with a walker but as she has gotten more active she does occasionally feel like the knee gives out.  She is not having pain in the knee.    Surgical History:   None  PMH/PSH/Family History/Social History/Meds/Allergies:    Past Medical History:  Diagnosis Date   Class 3 obesity (HCC) 12/09/2020   Depression    GERD (gastroesophageal reflux disease)    Guillain Barr syndrome (HCC)    Nonalcoholic steatohepatitis (NASH) 12/09/2020   Obesity    PUD (peptic ulcer disease) 12/09/2020   Past Surgical History:  Procedure Laterality Date   BIOPSY  11/19/2020   Procedure: BIOPSY;  Surgeon: Charna Elizabeth, MD;  Location: [facility];  Service: Endoscopy;;   ESOPHAGOGASTRODUODENOSCOPY N/A 01/10/2021   normal   ESOPHAGOGASTRODUODENOSCOPY (EGD) WITH PROPOFOL N/A 11/19/2020   LA Grade B reflux esophagitis, one small non-bleeding gastric ulcer in antrum s/p biopsy. Negative h.pylori.   FLEXIBLE SIGMOIDOSCOPY N/A 01/10/2021   Colonoscopy attempted Oct 2022 by Dr. Levora Angel but prep was poor. Stool in rectum and rectosigmoid colon.   Social History   Socioeconomic History   Marital status: Single    Spouse name: Not on file   Number of children: Not on file   Years of education: Not on file   Highest education level: Not on file  Occupational  History   Not on file  Tobacco Use   Smoking status: Never   Smokeless tobacco: Never  Vaping Use   Vaping status: Never Used  Substance and Sexual Activity   Alcohol use: Not Currently   Drug use: No   Sexual activity: Not on file  Other Topics Concern   Not on file  Social History Narrative   Not on file   Social Determinants of Health   Financial Resource Strain: Not on file  Food Insecurity: Not on file  Transportation Needs: Not on file  Physical Activity: Not on file  Stress: Not on file  Social Connections: Not on file   Family History  Problem Relation Age of Onset   Hypertension Other    Colon cancer Maternal Aunt    Colon polyps Neg Hx    Allergies  Allergen Reactions   Azithromycin Shortness Of Breath and Nausea And Vomiting   Current Outpatient Medications  Medication Sig Dispense Refill   acetaminophen (TYLENOL) 325 MG tablet Take 2 tablets (650 mg total) by mouth every 6 (six) hours as needed for mild pain (or Fever >/= 101).     albuterol (VENTOLIN HFA) 108 (90 Base) MCG/ACT inhaler Inhale 1 puff into the lungs every 6 (six) hours as needed for wheezing or shortness of breath.  ascorbic acid (VITAMIN C) 500 MG tablet Take 500 mg by mouth daily.     buPROPion (WELLBUTRIN SR) 150 MG 12 hr tablet Take 150 mg by mouth daily.     busPIRone (BUSPAR) 5 MG tablet Take 1 tablet (5 mg total) by mouth 2 (two) times daily.     cholecalciferol (VITAMIN D) 25 MCG tablet Take 2 tablets (2,000 Units total) by mouth daily. (Patient taking differently: Take 1,000 Units by mouth daily.) 30 tablet 1   cyanocobalamin (,VITAMIN B-12,) 1000 MCG/ML injection Inject 1 mL (1,000 mcg total) into the muscle every 30 (thirty) days. 1 mL 0   folic acid (FOLVITE) 1 MG tablet Take 1 tablet (1 mg total) by mouth daily.     guaiFENesin-dextromethorphan (ROBITUSSIN DM) 100-10 MG/5ML syrup Take 10 mLs by mouth every 4 (four) hours as needed for cough. 118 mL 0   hydrocortisone (ANUSOL-HC)  25 MG suppository Place 1 suppository (25 mg total) rectally 2 (two) times daily. 12 suppository 0   hydrOXYzine (ATARAX) 25 MG tablet Take 1 tablet (25 mg total) by mouth every 8 (eight) hours as needed for anxiety. 20 tablet 0   linaclotide (LINZESS) 290 MCG CAPS capsule Take 1 capsule (290 mcg total) by mouth daily before breakfast. 30 capsule    melatonin 5 MG TABS Take 10 mg by mouth at bedtime.     methocarbamol (ROBAXIN) 500 MG tablet Take 1 tablet (500 mg total) by mouth every 6 (six) hours as needed for muscle spasms. 20 tablet 0   metoprolol tartrate (LOPRESSOR) 25 MG tablet Take 0.5 tablets (12.5 mg total) by mouth 2 (two) times daily. 60 tablet 1   Nystatin (GERHARDT'S BUTT CREAM) CREA Apply 1 application topically as needed for irritation.     ondansetron (ZOFRAN-ODT) 4 MG disintegrating tablet Take 1 tablet (4 mg total) by mouth every 8 (eight) hours as needed for nausea or vomiting. 30 tablet 0   oxyCODONE (OXY IR/ROXICODONE) 5 MG immediate release tablet Take 1 tablet (5 mg total) by mouth every 12 (twelve) hours as needed for severe pain. 15 tablet 0   pantoprazole (PROTONIX) 40 MG tablet Take 1 tablet (40 mg total) by mouth 2 (two) times daily. 60 tablet 2   polyethylene glycol (MIRALAX / GLYCOLAX) 17 g packet Take 17 g by mouth 2 (two) times daily. 14 each 0   predniSONE (DELTASONE) 10 MG tablet Take 1 tablet (10 mg total) by mouth daily with breakfast.     pregabalin (LYRICA) 75 MG capsule Take 1 capsule (75 mg total) by mouth 3 (three) times daily. 60 capsule 0   senna-docusate (SENOKOT-S) 8.6-50 MG tablet Take 1 tablet by mouth at bedtime as needed for mild constipation.     traMADol (ULTRAM) 50 MG tablet Take 1 tablet (50 mg total) by mouth every 6 (six) hours as needed. 20 tablet 0   vitamin B-12 (CYANOCOBALAMIN) 500 MCG tablet Take 1 tablet (500 mcg total) by mouth daily. 30 tablet 2   Zinc 220 (50 Zn) MG CAPS Take 1 capsule by mouth daily at 6 (six) AM. For 14 days due to  covid     No current facility-administered medications for this visit.   No results found.  Review of Systems:   A ROS was performed including pertinent positives and negatives as documented in the HPI.  Physical Exam :   Constitutional: NAD and appears stated age Neurological: Alert and oriented Psych: Appropriate affect and cooperative There were no vitals taken for  this visit.   Comprehensive Musculoskeletal Exam:      Musculoskeletal Exam  Gait Normal  Alignment Normal   Right Left  Inspection Normal Normal  Palpation    Tenderness None None  Crepitus None None  Effusion None None  Range of Motion    Extension -3 -3  Flexion 135 135  Strength    Extension 5/5 5/5  Flexion 5/5 5/5  Ligament Exam     Generalized Laxity No No  Lachman Negative Positive  Pivot Shift Negative Negative  Anterior Drawer Negative Positive  Valgus at 0 Negative Negative  Valgus at 20 Negative Negative  Varus at 0 0 0  Varus at 20   0 0  Posterior Drawer at 90 0 0  Vascular/Lymphatic Exam    Edema None None  Venous Stasis Changes No No  Distal Circulation Normal Normal  Neurologic    Light Touch Sensation Intact Intact  Special Tests: Positive posterior drawer     Imaging:   Xray (4 views left knee): No evidence of residual instability  MRI (left knee): Limited by motion artifact although there is complete tear of the ACL as well as MCL with a proximal avulsion of the PCL  I personally reviewed and interpreted the radiographs.   Assessment:   32 y.o. female with multi ligamentous knee injury after a knee dislocation 2 years prior.  I did describe that at this point she does not have any pain and with her brace she does feel fairly stable.  As result I would like her to continue to progress with physical therapy on the left knee in order to strengthen and regain her gait.  I did discuss that ultimately if the knee continues to give out while she attempts to establish normal  gait that we would consider an additional MRI to see what is healed in the knee.  I did discuss that if she does remain unstable as she attempts to be more active and recover from Guillain-Barr syndrome that we could consider surgical intervention for ligamentous reconstruction.  I will plan to see her back in 6 months for recheck as she mobilizes and we will plan on an MRI sooner if she did continues to feel unstable throughout gait  Plan :    -Return to clinic 6 months     I personally saw and evaluated the patient, and participated in the management and treatment plan.  Huel Cote, MD Attending Physician, Orthopedic Surgery  This document was dictated using Dragon voice recognition software. A reasonable attempt at proof reading has been made to minimize errors.

## 2023-03-06 DIAGNOSIS — K253 Acute gastric ulcer without hemorrhage or perforation: Secondary | ICD-10-CM | POA: Insufficient documentation

## 2023-03-06 DIAGNOSIS — K219 Gastro-esophageal reflux disease without esophagitis: Secondary | ICD-10-CM | POA: Diagnosis present

## 2023-03-11 ENCOUNTER — Telehealth: Payer: Self-pay | Admitting: Neurology

## 2023-03-11 NOTE — Telephone Encounter (Signed)
Pt called to confirm is she had a copay for upcoming appt

## 2023-03-12 ENCOUNTER — Encounter: Payer: Self-pay | Admitting: Neurology

## 2023-03-12 ENCOUNTER — Ambulatory Visit (INDEPENDENT_AMBULATORY_CARE_PROVIDER_SITE_OTHER): Payer: Medicaid Other | Admitting: Neurology

## 2023-03-12 VITALS — BP 125/89 | HR 119 | Resp 17 | Ht 64.0 in

## 2023-03-12 DIAGNOSIS — G6181 Chronic inflammatory demyelinating polyneuritis: Secondary | ICD-10-CM | POA: Diagnosis not present

## 2023-03-12 DIAGNOSIS — R269 Unspecified abnormalities of gait and mobility: Secondary | ICD-10-CM | POA: Diagnosis not present

## 2023-03-12 NOTE — Progress Notes (Signed)
Chief Complaint  Patient presents with   New Patient (Initial Visit)    Rm15, alone,  NP/Paper/[place]/Oscar Njihi NP [phone] syndrome, muscle weakness: pt has gait abnormality uses walker and wheel chair, pt has random bruising, flare ups up hives, wants nerve conduction study       ASSESSMENT AND PLAN  Carla Little is a 32 y.o. female   History of Guillain-Barr syndrome in September 2022  Following her left knee dislocation, UTI  Tried 2 rounds of IVIG, in September 2022, January 2023 denies significant improvement,  Was treated with p.o. prednisone, now on 10 mg daily,  Profound gait abnormality is most related to her obesity, continued left knee issue, also suffered left foot fracture  EMG nerve conduction study to evaluate peripheral neuropathy  Continue physical therapy  She is making progress in weight loss, definitely benefit water aerobic  DIAGNOSTIC DATA (LABS, IMAGING, TESTING) - I reviewed patient records, labs, notes, testing and imaging myself where available.   MEDICAL HISTORY:  Carla Little, is a 32 year old female seen in request by her primary care doctor Sherol Dade, to follow-up on gait abnormality, history of Guillain-Barr syndrome in September 2022  History is obtained from the patient and review of electronic medical records. I personally reviewed pertinent available imaging films in PACS.   PMHx of  History of Guillain-Barr syndrome that was diagnosed in September 2022 Depression Morbid obesity GERD   I was able to review neurohospitalist evaluation, initially by Dr. Amada Jupiter on December 20, 2020, patient was admitted left knee dislocation in August 2022 following a fall accident, then  complains of numbness tingling started since September 2022, was found to have sensory loss, areflexia, profound gait abnormality distal weakness MRI of the brain, cervical spine from December 20, 2020 was  normal  Spinal fluid testing from December 21, 2020, showed elevated total protein of 57, glucose of 78, WBC of 1,  Extensive laboratory evaluations showed normal cortisone, B6, copper, vitamin E, B12 deficiency 153, folic acid was decreased 3.2, slight elevation in free T4, mild elevation of TSH 5.2,  Treated with IVIG 2 g/kg x 5 days since December 21, 2020, repeat again in January 2023  She was started on prednisone tapering dose, was not sure about the benefit, now on 10 mg daily  She denies significant improvement with IVIG treatment, was discharged to nursing home at [facility], her recovery is hindered by her weight issue, left knee dislocation knee pain, also suffered left foot fracture  She now able to take few steps using walker, continue have numbness tingling in her lower extremity, denies significant upper extremity weakness, or sensory loss, denies bowel and bladder incontinence  PHYSICAL EXAM:   Vitals:   03/12/23 1420  BP: 125/89  Pulse: (!) 119  Resp: 17  Height: 5\' 4"  (1.626 m)   Not recorded     Body mass index is 57.29 kg/m.  PHYSICAL EXAMNIATION:  Gen: NAD, conversant, well nourised, well groomed                     Cardiovascular: Regular rate rhythm, no peripheral edema, warm, nontender. Eyes: Conjunctivae clear without exudates or hemorrhage Neck: Supple, no carotid bruits. Pulmonary: Clear to auscultation bilaterally   NEUROLOGICAL EXAM:  MENTAL STATUS: Speech/cognition: Morbidly obese, awake, alert, oriented to history taking and casual conversation CRANIAL NERVES: CN II: Visual fields are full to confrontation. Pupils are round equal and briskly reactive to light. CN III, IV, VI:  extraocular movement are normal. No ptosis. CN V: Facial sensation is intact to light touch CN VII: Face is symmetric with normal eye closure  CN VIII: Hearing is normal to causal conversation. CN IX, X: Phonation is normal. CN XI: Head turning and shoulder  shrug are intact  MOTOR: There is no pronator drift of out-stretched arms. Muscle bulk and tone are normal. Muscle strength is normal.  REFLEXES: Areflexia  SENSORY: Decreased vibratory sensation light touch pinprick to mid shin level, well-preserved toe proprioception COORDINATION: There is no trunk or limb dysmetria noted.  GAIT/STANCE: Deferred  REVIEW OF SYSTEMS:  Full 14 system review of systems performed and notable only for as above All other review of systems were negative.   ALLERGIES: Allergies  Allergen Reactions   Azithromycin Shortness Of Breath and Nausea And Vomiting    HOME MEDICATIONS: Current Outpatient Medications  Medication Sig Dispense Refill   acetaminophen (TYLENOL) 325 MG tablet Take 2 tablets (650 mg total) by mouth every 6 (six) hours as needed for mild pain (or Fever >/= 101).     ascorbic acid (VITAMIN C) 500 MG tablet Take 500 mg by mouth daily.     busPIRone (BUSPAR) 5 MG tablet Take 1 tablet (5 mg total) by mouth 2 (two) times daily.     cholecalciferol (VITAMIN D) 25 MCG tablet Take 2 tablets (2,000 Units total) by mouth daily. (Patient taking differently: Take 1,000 Units by mouth daily.) 30 tablet 1   cyanocobalamin (,VITAMIN B-12,) 1000 MCG/ML injection Inject 1 mL (1,000 mcg total) into the muscle every 30 (thirty) days. 1 mL 0   folic acid (FOLVITE) 1 MG tablet Take 1 tablet (1 mg total) by mouth daily.     hydrOXYzine (ATARAX) 25 MG tablet Take 1 tablet (25 mg total) by mouth every 8 (eight) hours as needed for anxiety. 20 tablet 0   lansoprazole (PREVACID) 30 MG capsule Take 30 mg by mouth daily at 12 noon.     linaclotide (LINZESS) 290 MCG CAPS capsule Take 1 capsule (290 mcg total) by mouth daily before breakfast. 30 capsule    methocarbamol (ROBAXIN) 500 MG tablet Take 1 tablet (500 mg total) by mouth every 6 (six) hours as needed for muscle spasms. 20 tablet 0   mirtazapine (REMERON SOL-TAB) 15 MG disintegrating tablet Take 15 mg by  mouth at bedtime.     MOLNUPIRAVIR PO Take 800 mg by mouth 2 (two) times daily.     Multiple Vitamin (MULTIVITAMIN) capsule Take 1 capsule by mouth daily.     naloxone (NARCAN) nasal spray 4 mg/0.1 mL Place 1 spray into the nose once.     Nystatin (GERHARDT'S BUTT CREAM) CREA Apply 1 application topically as needed for irritation.     ondansetron (ZOFRAN-ODT) 4 MG disintegrating tablet Take 1 tablet (4 mg total) by mouth every 8 (eight) hours as needed for nausea or vomiting. 30 tablet 0   oxyCODONE (OXY IR/ROXICODONE) 5 MG immediate release tablet Take 1 tablet (5 mg total) by mouth every 12 (twelve) hours as needed for severe pain. 15 tablet 0   pantoprazole (PROTONIX) 40 MG tablet Take 1 tablet (40 mg total) by mouth 2 (two) times daily. 60 tablet 2   polyethylene glycol (MIRALAX / GLYCOLAX) 17 g packet Take 17 g by mouth 2 (two) times daily. 14 each 0   predniSONE (DELTASONE) 10 MG tablet Take 1 tablet (10 mg total) by mouth daily with breakfast.     pregabalin (LYRICA) 75 MG capsule  Take 1 capsule (75 mg total) by mouth 3 (three) times daily. 60 capsule 0   Semaglutide-Weight Management (WEGOVY) 0.5 MG/0.5ML SOAJ Inject 0.5 mg into the skin every 7 (seven) days.     senna-docusate (SENOKOT-S) 8.6-50 MG tablet Take 1 tablet by mouth at bedtime as needed for mild constipation.     venlafaxine (EFFEXOR) 37.5 MG tablet Take 37.5 mg by mouth daily.     vitamin B-12 (CYANOCOBALAMIN) 500 MCG tablet Take 1 tablet (500 mcg total) by mouth daily. 30 tablet 2   buPROPion (WELLBUTRIN SR) 150 MG 12 hr tablet Take 150 mg by mouth daily.     No current facility-administered medications for this visit.    PAST MEDICAL HISTORY: Past Medical History:  Diagnosis Date   Class 3 obesity 12/09/2020   Depression    GERD (gastroesophageal reflux disease)    Guillain Barr syndrome (HCC)    Nonalcoholic steatohepatitis (NASH) 12/09/2020   Obesity    PUD (peptic ulcer disease) 12/09/2020    PAST SURGICAL  HISTORY: Past Surgical History:  Procedure Laterality Date   BIOPSY  11/19/2020   Procedure: BIOPSY;  Surgeon: Charna Elizabeth, MD;  Location: [facility];  Service: Endoscopy;;   ESOPHAGOGASTRODUODENOSCOPY N/A 01/10/2021   normal   ESOPHAGOGASTRODUODENOSCOPY (EGD) WITH PROPOFOL N/A 11/19/2020   LA Grade B reflux esophagitis, one small non-bleeding gastric ulcer in antrum s/p biopsy. Negative h.pylori.   FLEXIBLE SIGMOIDOSCOPY N/A 01/10/2021   Colonoscopy attempted Oct 2022 by Dr. Levora Angel but prep was poor. Stool in rectum and rectosigmoid colon.    FAMILY HISTORY: Family History  Problem Relation Age of Onset   Hypertension Other    Colon cancer Maternal Aunt    Colon polyps Neg Hx     SOCIAL HISTORY: Social History   Socioeconomic History   Marital status: Single    Spouse name: Not on file   Number of children: Not on file   Years of education: Not on file   Highest education level: Not on file  Occupational History   Not on file  Tobacco Use   Smoking status: Never   Smokeless tobacco: Never  Vaping Use   Vaping status: Never Used  Substance and Sexual Activity   Alcohol use: Not Currently   Drug use: No   Sexual activity: Not on file  Other Topics Concern   Not on file  Social History Narrative   Not on file   Social Drivers of Health   Financial Resource Strain: Not on file  Food Insecurity: Not on file  Transportation Needs: Not on file  Physical Activity: Not on file  Stress: Not on file  Social Connections: Not on file  Intimate Partner Violence: Not on file      Levert Feinstein, M.D. Ph.D.  [facility] [address] [place], [place] [address] Ph: [phone] Fax: [phone]  CC:  Dairl Ponder, NP [address] [place],  [place] [address]  Sherol Dade, DO

## 2023-05-08 ENCOUNTER — Encounter: Payer: Medicare Other | Admitting: Neurology

## 2023-05-08 ENCOUNTER — Encounter: Payer: Self-pay | Admitting: Neurology

## 2023-05-08 ENCOUNTER — Encounter: Payer: Medicaid Other | Admitting: Neurology

## 2023-05-08 ENCOUNTER — Telehealth: Payer: Self-pay | Admitting: Neurology

## 2023-05-08 NOTE — Telephone Encounter (Signed)
Aneta Mins @ [facility] has called to request pt's NCV/EMG be rescheduled, he can be reached at [phone]

## 2023-05-09 NOTE — Telephone Encounter (Signed)
LVM and sent mychart msg offering NCV/EMG appt on 05/23/23

## 2023-05-09 NOTE — Telephone Encounter (Signed)
Please schedule the patient for 05/23/23 at 12:30 PM for nerve conduction study, 1:30 PM EMG for Dr. Terrace Arabia. Per Dr. Terrace Arabia ok to double book.

## 2023-05-09 NOTE — Telephone Encounter (Signed)
It is OK to add her on to my schedule.

## 2023-07-03 ENCOUNTER — Ambulatory Visit (HOSPITAL_BASED_OUTPATIENT_CLINIC_OR_DEPARTMENT_OTHER): Payer: Medicaid Other | Admitting: Orthopaedic Surgery

## 2023-08-30 ENCOUNTER — Emergency Department (HOSPITAL_COMMUNITY)
Admission: EM | Admit: 2023-08-30 | Discharge: 2023-08-31 | Disposition: A | Discharge status: Home / Self Care | Attending: Emergency Medicine | Admitting: Emergency Medicine

## 2023-08-30 ENCOUNTER — Other Ambulatory Visit: Payer: Self-pay

## 2023-08-30 ENCOUNTER — Emergency Department (HOSPITAL_COMMUNITY)

## 2023-08-30 DIAGNOSIS — W06XXXA Fall from bed, initial encounter: Secondary | ICD-10-CM | POA: Diagnosis not present

## 2023-08-30 DIAGNOSIS — M25562 Pain in left knee: Secondary | ICD-10-CM | POA: Diagnosis present

## 2023-08-30 DIAGNOSIS — S83125A Posterior dislocation of proximal end of tibia, left knee, initial encounter: Secondary | ICD-10-CM | POA: Diagnosis not present

## 2023-08-30 LAB — BASIC METABOLIC PANEL WITH GFR
Anion gap: 6 (ref 5–15)
BUN: 10 mg/dL (ref 6–20)
CO2: 27 mmol/L (ref 22–32)
Calcium: 8.7 mg/dL — ABNORMAL LOW (ref 8.9–10.3)
Chloride: 102 mmol/L (ref 98–111)
Creatinine, Ser: 0.6 mg/dL (ref 0.44–1.00)
GFR, Estimated: >60 mL/min (ref >60)
Glucose, Bld: 87 mg/dL (ref 70–99)
Potassium: 3.7 mmol/L (ref 3.5–5.1)
Sodium: 135 mmol/L (ref 135–145)

## 2023-08-30 LAB — CBC
HCT: 40.8 % (ref 36.0–46.0)
Hemoglobin: 12.7 g/dL (ref 12.0–15.0)
MCH: 27.5 pg (ref 26.0–34.0)
MCHC: 31.1 g/dL (ref 30.0–36.0)
MCV: 88.5 fL (ref 80.0–100.0)
Platelets: 342 10*3/uL (ref 150–400)
RBC: 4.61 MIL/uL (ref 3.87–5.11)
RDW: 19.5 % — ABNORMAL HIGH (ref 11.5–15.5)
WBC: 10.7 10*3/uL — ABNORMAL HIGH (ref 4.0–10.5)
nRBC: 0.5 % — ABNORMAL HIGH (ref 0.0–0.2)

## 2023-08-30 MED ORDER — LIDOCAINE-EPINEPHRINE (PF) 1 %-1:200000 IJ SOLN
20.0000 mL | Freq: Once | INTRAMUSCULAR | Status: AC
  Administered 2023-08-31: 20 mL
  Filled 2023-08-30: qty 30

## 2023-08-30 MED ORDER — OXYCODONE HCL 5 MG PO TABS
5.0000 mg | ORAL_TABLET | ORAL | Status: AC
  Administered 2023-08-30: 5 mg via ORAL
  Filled 2023-08-30: qty 1

## 2023-08-30 MED ORDER — HYDROMORPHONE HCL 1 MG/ML IJ SOLN: 0.5000 mg | Freq: Once | INTRAMUSCULAR | Status: DC

## 2023-08-30 NOTE — ED Triage Notes (Signed)
 Pt had a fall about two weeks ago at [facility]. Had xrays done than, but it took about a week for results. She fell again one week ago. Was notified today that her left knee is dislocated.

## 2023-08-30 NOTE — ED Provider Notes (Signed)
  EMERGENCY DEPARTMENT AT [facility] Provider Note   CSN: [id number] Arrival date & time: 08/30/23  2122     History  Chief Complaint  Patient presents with   Knee Pain    Left knee pain    Carla Little is a 33 y.o. female.  33 year old female with history of obesity and Guillain-Barr who presents emergency department left leg pain.  Patient reports that 2 weeks ago she fell out of bed and hurt her left knee.  Says that 1 week ago when she was being assisted and then unfortunately fell on her knee again.  Had x-rays that just came back tonight that show that she has a posterior knee dislocation.  No numbness or weakness of her foot.  Has had knee dislocations in that knee in the past.       Home Medications Prior to Admission medications   Medication Sig Start Date End Date Taking? Authorizing Provider  acetaminophen  (TYLENOL ) 325 MG tablet Take 2 tablets (650 mg total) by mouth every 6 (six) hours as needed for mild pain (or Fever >/= 101). 01/26/21   Sheril Dines, MD  ascorbic acid  (VITAMIN C) 500 MG tablet Take 500 mg by mouth daily.    [provider]  buPROPion  (WELLBUTRIN  SR) 150 MG 12 hr tablet Take 150 mg by mouth daily.    [provider]  busPIRone  (BUSPAR ) 5 MG tablet Take 1 tablet (5 mg total) by mouth 2 (two) times daily. 01/26/21   Sheril Dines, MD  cholecalciferol  (VITAMIN D ) 25 MCG tablet Take 2 tablets (2,000 Units total) by mouth daily. Patient taking differently: Take 1,000 Units by mouth daily. 11/28/20   Magdalene School, MD  cyanocobalamin  (,VITAMIN B-12,) 1000 MCG/ML injection Inject 1 mL (1,000 mcg total) into the muscle every 30 (thirty) days. 01/29/21   Sheril Dines, MD  folic acid  (FOLVITE ) 1 MG tablet Take 1 tablet (1 mg total) by mouth daily. 01/27/21   Sheril Dines, MD  hydrOXYzine  (ATARAX ) 25 MG tablet Take 1 tablet (25 mg total) by mouth every 8 (eight) hours as needed for anxiety. 04/28/21   Justina Oman,  MD  lansoprazole (PREVACID) 30 MG capsule Take 30 mg by mouth daily at 12 noon.    [provider]  linaclotide  (LINZESS ) 290 MCG CAPS capsule Take 1 capsule (290 mcg total) by mouth daily before breakfast. 04/18/21   Gwendalyn Lemma, MD  methocarbamol  (ROBAXIN ) 500 MG tablet Take 1 tablet (500 mg total) by mouth every 6 (six) hours as needed for muscle spasms. 11/28/20   Magdalene School, MD  mirtazapine  (REMERON  SOL-TAB) 15 MG disintegrating tablet Take 15 mg by mouth at bedtime.    [provider]  MOLNUPIRAVIR PO Take 800 mg by mouth 2 (two) times daily.    [provider]  Multiple Vitamin (MULTIVITAMIN) capsule Take 1 capsule by mouth daily.    [provider]  naloxone [facility]) nasal spray 4 mg/0.1 mL Place 1 spray into the nose once.    [provider]  Nystatin  (GERHARDT'S BUTT CREAM) CREA Apply 1 application topically as needed for irritation. 04/17/21   Gwendalyn Lemma, MD  ondansetron  (ZOFRAN -ODT) 4 MG disintegrating tablet Take 1 tablet (4 mg total) by mouth every 8 (eight) hours as needed for nausea or vomiting. 04/28/21   Justina Oman, MD  oxyCODONE  (OXY IR/ROXICODONE ) 5 MG immediate release tablet Take 1 tablet (5 mg total) by mouth every 12 (twelve) hours as needed for severe pain. 04/28/21  Justina Oman, MD  pantoprazole  (PROTONIX ) 40 MG tablet Take 1 tablet (40 mg total) by mouth 2 (two) times daily. 04/28/21   Justina Oman, MD  polyethylene glycol (MIRALAX  / GLYCOLAX ) 17 g packet Take 17 g by mouth 2 (two) times daily. 04/17/21   Gwendalyn Lemma, MD  predniSONE  (DELTASONE ) 10 MG tablet Take 1 tablet (10 mg total) by mouth daily with breakfast. 04/29/21   Justina Oman, MD  pregabalin  (LYRICA ) 75 MG capsule Take 1 capsule (75 mg total) by mouth 3 (three) times daily. 01/26/21   Sheril Dines, MD  Semaglutide-Weight Management (WEGOVY) 0.5 MG/0.5ML SOAJ Inject 0.5 mg into the skin every 7 (seven) days.    [provider]   senna-docusate (SENOKOT-S) 8.6-50 MG tablet Take 1 tablet by mouth at bedtime as needed for mild constipation. 01/26/21   Sheril Dines, MD  venlafaxine (EFFEXOR) 37.5 MG tablet Take 37.5 mg by mouth daily.    [provider]  vitamin B-12 (CYANOCOBALAMIN ) 500 MCG tablet Take 1 tablet (500 mcg total) by mouth daily. 04/29/21   Justina Oman, MD      Allergies    Azithromycin     Review of Systems   Review of Systems  Physical Exam Updated Vital Signs BP 100/79 (BP Location: Left Arm)   Pulse (!) 102   Temp 98.1 F (36.7 C) (Oral)   Resp 20   Ht 5\' 4"  (1.626 m)   Wt (!) 192.3 kg   SpO2 93%   BMI 72.78 kg/m  Physical Exam Constitutional:      Appearance: She is obese.  HENT:     Head: Normocephalic and atraumatic.  Musculoskeletal:     Comments: Range of motion of left knee limited due to pain.  No obvious deformity but exam is limited due to habitus.  DP pulse 2+ in left foot.  Neurological:     Mental Status: She is alert.     Comments: Intact sensation light touch in all dermatomes of the left foot.     ED Results / Procedures / Treatments   Labs (all labs ordered are listed, but only abnormal results are displayed) Labs Reviewed  CBC - Abnormal; Notable for the following components:      Result Value   WBC 10.7 (*)    RDW 19.5 (*)    nRBC 0.5 (*)    All other components within normal limits  BASIC METABOLIC PANEL WITH GFR - Abnormal; Notable for the following components:   Calcium  8.7 (*)    All other components within normal limits  HCG, QUANTITATIVE, PREGNANCY    EKG None  Radiology DG Knee Complete 4 Views Left Result Date: 08/30/2023 CLINICAL DATA:  History of left knee dislocation following fall EXAM: LEFT KNEE - COMPLETE 4+ VIEW COMPARISON:  None Available. FINDINGS: Posterior dislocation of the tibia with respect to the distal femur is noted. The dislocation is incomplete as the anterior aspect of the tibia lies mid femoral condyle. No acute  fracture is seen. Small joint effusion is noted. IMPRESSION: Posterior incomplete dislocation of the tibia with respect to the distal femur. Electronically Signed   By: Violeta Grey M.D.   On: 08/30/2023 22:04    Procedures .Nerve Block  Date/Time: 08/31/2023 12:59 AM  Performed by: Ninetta Basket, MD Authorized by: Ninetta Basket, MD   Consent:    Consent obtained:  Verbal   Consent given by:  Patient   Risks discussed:  Allergic reaction, infection, nerve damage, swelling, bleeding, intravenous  injection, pain and unsuccessful block Universal protocol:    Patient identity confirmed:  Verbally with patient Indications:    Indications:  Pain relief and procedural anesthesia Location:    Body area:  Lower extremity   Lower extremity nerve:  Femoral   Laterality:  Left Pre-procedure details:    Skin preparation:  Chlorhexidine  Skin anesthesia:    Skin anesthesia method:  Local infiltration   Local anesthetic:  Lidocaine  1% WITH epi Procedure details:    Block needle gauge:  22 G   Guidance: ultrasound     Anesthetic injected:  Lidocaine  1% WITH epi Post-procedure details:    Procedure completion:  Tolerated well, no immediate complications .Reduction of dislocation  Date/Time: 08/31/2023 1:00 AM  Performed by: Ninetta Basket, MD Authorized by: Ninetta Basket, MD  Consent: Verbal consent obtained. Consent given by: patient Patient identity confirmed: verbally with patient Local anesthesia used: yes Anesthesia: nerve block  Anesthesia: Local anesthesia used: yes Patient tolerance: patient tolerated the procedure well with no immediate complications Comments: Attempted relocation of posterior dislocated knee.  Did feel a pop and patient was placed in a knee immobilizer shortly afterwards.  DP pulse 2+.  Intact sensation of the left foot after the procedure with intact mobility of the foot.       Medications Ordered in ED Medications  HYDROmorphone   (DILAUDID ) injection 0.5 mg (0 mg Intravenous Hold 08/30/23 2324)  oxyCODONE  (Oxy IR/ROXICODONE ) immediate release tablet 5 mg (5 mg Oral Given 08/30/23 2159)  lidocaine -EPINEPHrine  (PF) (XYLOCAINE -EPINEPHrine ) 1 %-1:200000 (PF) injection 20 mL (20 mLs Infiltration Given 08/31/23 0008)    ED Course/ Medical Decision Making/ A&P Clinical Course as of 08/31/23 0101  Sat Aug 31, 2023  0000 Dr Adrain Alar from ortho consulted.  Recommends follow-up with Dr. Hermina Loosen and placing patient in a long-leg splint if unable to reduce in the emergency department. [RP]  [id number] Signed out to Dr Luberta Ruse.  [RP]    Clinical Course User Index [RP] Ninetta Basket, MD                                 Medical Decision Making Amount and/or Complexity of Data Reviewed Labs: ordered. Radiology: ordered.  Risk Prescription drug management.   34 year old female with a history of morbid obesity, left knee dislocations, Guillain-Barr who presents emergency department with left knee pain  Initial Ddx:  Knee dislocation, fracture, popliteal artery injury, peroneal nerve injury  MDM/Course:  Patient presents to the emergency department with left knee pain.  Already had imaging that shows possible left knee dislocation but unfortunately I am unable to view this.  X-rays here did confirm that she has a posterior knee dislocation.  She is neurovascular intact distally.  ABI in the left foot is 1.1 so feel that arterial injury is highly unlikely.  Did discuss with orthopedics who recommended attempting reduction in the emergency department.  Unfortunately due to her BMI and the fact that she has a Mallampati score of 4 with reduced neck mobility is a very high risk for sedation.  Attempted femoral nerve block but unfortunately was unsuccessful.  Patient did request that we attempt to reduce her knee which was performed after oral pain medication.  Signed out to the oncoming physician awaiting postreduction x-rays.    This patient  presents to the ED for concern of complaints listed in HPI, this involves an extensive number of treatment options, and is a  complaint that carries with it a high risk of complications and morbidity. Disposition including potential need for admission considered.   Dispo: Pending remainder of workup  Records reviewed Outpatient Clinic Notes The following labs were independently interpreted: Chemistry and show no acute abnormality I independently reviewed the following imaging with scope of interpretation limited to determining acute life threatening conditions related to emergency care: Extremity x-ray(s) and agree with the radiologist interpretation with the following exceptions: none I personally reviewed and interpreted cardiac monitoring: normal sinus rhythm  I personally reviewed and interpreted the pt's EKG: see above for interpretation  I have reviewed the patients home medications and made adjustments as needed Consults: Orthopedics  Portions of this note were generated with Scientist, clinical (histocompatibility and immunogenetics). Dictation errors may occur despite best attempts at proofreading.     Final Clinical Impression(s) / ED Diagnoses Final diagnoses:  Closed posterior dislocation of left knee, initial encounter    Rx / DC Orders ED Discharge Orders     None         Ninetta Basket, MD 08/31/23 [phone]

## 2023-08-30 NOTE — ED Notes (Signed)
 Unable to obtain IV access at this time. Will try to contact the SWOT nurse for assistance.

## 2023-08-31 ENCOUNTER — Emergency Department (HOSPITAL_COMMUNITY)

## 2023-08-31 DIAGNOSIS — S83125A Posterior dislocation of proximal end of tibia, left knee, initial encounter: Secondary | ICD-10-CM | POA: Diagnosis not present

## 2023-08-31 LAB — HCG, QUANTITATIVE, PREGNANCY: hCG, Beta Chain, Quant, S: <1 m[IU]/mL (ref <5)

## 2023-08-31 MED ORDER — OXYCODONE HCL 5 MG PO TABS: 5.0000 mg | ORAL_TABLET | Freq: Two times a day (BID) | ORAL | 0 refills | Status: DC | PRN

## 2023-08-31 NOTE — Discharge Instructions (Addendum)
 You were seen for your dislocated knee in the emergency department.   At home, please take tylenol  and ibuprofen  for your pain.  You may also take the oxycodone  we have prescribed you for any breakthrough pain that may have.  Do not take this before driving or operating heavy machinery.  Do not take this medication with alcohol.  Please rest your joint and elevate it.  Use ice to limit the swelling.  Wear the knee immobilizer you were given and do not bear weight until you follow-up with orthopedics.  Check your MyChart online for the results of any tests that had not resulted by the time you left the emergency department.   Follow-up with orthopedics or sports medicine in 1 week.  Return immediately to the emergency department if you experience any of the following: Worsening pain, or any other concerning symptoms.    Thank you for visiting our Emergency Department. It was a pleasure taking care of you today.

## 2023-08-31 NOTE — ED Provider Notes (Signed)
 1:11 AM Assumed care from Dr. Adan Holms, please see their note for full history, physical and decision making until this point. In brief this is a 33 y.o. 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 Reduced. In immobilizer. Pending fu xr. High risk for sedation, if not totally reduced, will need anesthesia and reduction.   Xr with significant improvement. D/w patient the need for ortho follow up. Knee immobilizer already in place. Already in rehab. D/c to same. Nwb for 6 weeks.  Discharge instructions, including strict return precautions for new or worsening symptoms, given. Patient and/or family verbalized understanding and agreement with the plan as described.   Labs, studies and imaging reviewed by myself and considered in medical decision making if ordered. Imaging interpreted by radiology.  Labs Reviewed  CBC - Abnormal; Notable for the following components:      Result Value   WBC 10.7 (*)    RDW 19.5 (*)    nRBC 0.5 (*)    All other components within normal limits  BASIC METABOLIC PANEL WITH GFR - Abnormal; Notable for the following components:   Calcium  8.7 (*)    All other components within normal limits  HCG, QUANTITATIVE, PREGNANCY    DG Knee Complete 4 Views Left  Final Result    DG Knee Complete 4 Views Left    (Results Pending)    No follow-ups on file.    Carla Little, Reymundo Caulk, MD 08/31/23 [phone]

## 2023-09-11 ENCOUNTER — Encounter (HOSPITAL_BASED_OUTPATIENT_CLINIC_OR_DEPARTMENT_OTHER): Payer: Self-pay | Admitting: Orthopaedic Surgery

## 2023-09-11 ENCOUNTER — Ambulatory Visit (INDEPENDENT_AMBULATORY_CARE_PROVIDER_SITE_OTHER): Admitting: Orthopaedic Surgery

## 2023-09-11 DIAGNOSIS — M23612 Other spontaneous disruption of anterior cruciate ligament of left knee: Secondary | ICD-10-CM

## 2023-09-11 NOTE — Progress Notes (Signed)
 Chief Complaint: Left knee instability     History of Present Illness:   09/11/2023: Presents today for follow-up of her left knee.  Unfortunately she has had a residual dislocation of the left knee.  She is placed in a knee immobilizer and is here for further discussion  Carla Little is a 33 y.o. female presents as a referral from a partner Dr. Lucienne Ryder for ongoing left knee issues.  She does not have any pain in the left knee but does occasionally feel like giving out.  She is status post left knee dislocation in 2022 which was subsequently closed reduced and treated with Dr. Lucienne Ryder.  She has subsequently developed Guillain-Barr syndrome and as result has had to essentially relearn how to walk.  She has recently obtained a stability brace for this left knee.  She states that she is now walking with a walker but as she has gotten more active she does occasionally feel like the knee gives out.  She is not having pain in the knee.    Surgical History:   None  PMH/PSH/Family History/Social History/Meds/Allergies:    Past Medical History:  Diagnosis Date   Class 3 obesity 12/09/2020   Depression    GERD (gastroesophageal reflux disease)    Guillain Barr syndrome (HCC)    Nonalcoholic steatohepatitis (NASH) 12/09/2020   Obesity    PUD (peptic ulcer disease) 12/09/2020   Past Surgical History:  Procedure Laterality Date   BIOPSY  11/19/2020   Procedure: BIOPSY;  Surgeon: Tami Falcon, MD;  Location: [facility];  Service: Endoscopy;;   ESOPHAGOGASTRODUODENOSCOPY N/A 01/10/2021   normal   ESOPHAGOGASTRODUODENOSCOPY (EGD) WITH PROPOFOL  N/A 11/19/2020   LA Grade B reflux esophagitis, one small non-bleeding gastric ulcer in antrum s/p biopsy. Negative h.pylori.   FLEXIBLE SIGMOIDOSCOPY N/A 01/10/2021   Colonoscopy attempted Oct 2022 by Dr. Veronda Goody but prep was poor. Stool in rectum and rectosigmoid colon.   Social History   Socioeconomic  History   Marital status: Single    Spouse name: Not on file   Number of children: Not on file   Years of education: Not on file   Highest education level: Not on file  Occupational History   Not on file  Tobacco Use   Smoking status: Never   Smokeless tobacco: Never  Vaping Use   Vaping status: Never Used  Substance and Sexual Activity   Alcohol use: Not Currently   Drug use: No   Sexual activity: Not on file  Other Topics Concern   Not on file  Social History Narrative   Not on file   Social Drivers of Health   Financial Resource Strain: Not on file  Food Insecurity: Not on file  Transportation Needs: Not on file  Physical Activity: Not on file  Stress: Not on file  Social Connections: Not on file   Family History  Problem Relation Age of Onset   Hypertension Other    Colon cancer Maternal Aunt    Colon polyps Neg Hx    Allergies  Allergen Reactions   Azithromycin  Shortness Of Breath and Nausea And Vomiting   Current Outpatient Medications  Medication Sig Dispense Refill   acetaminophen  (TYLENOL ) 325 MG tablet Take 2 tablets (650 mg total) by mouth every 6 (six) hours as needed for mild pain (or Fever >/=  101).     ascorbic acid  (VITAMIN C) 500 MG tablet Take 500 mg by mouth daily.     buPROPion  (WELLBUTRIN  SR) 150 MG 12 hr tablet Take 150 mg by mouth daily.     busPIRone  (BUSPAR ) 5 MG tablet Take 1 tablet (5 mg total) by mouth 2 (two) times daily.     cholecalciferol  (VITAMIN D ) 25 MCG tablet Take 2 tablets (2,000 Units total) by mouth daily. (Patient taking differently: Take 1,000 Units by mouth daily.) 30 tablet 1   cyanocobalamin  (,VITAMIN B-12,) 1000 MCG/ML injection Inject 1 mL (1,000 mcg total) into the muscle every 30 (thirty) days. 1 mL 0   folic acid  (FOLVITE ) 1 MG tablet Take 1 tablet (1 mg total) by mouth daily.     hydrOXYzine  (ATARAX ) 25 MG tablet Take 1 tablet (25 mg total) by mouth every 8 (eight) hours as needed for anxiety. 20 tablet 0    lansoprazole (PREVACID) 30 MG capsule Take 30 mg by mouth daily at 12 noon.     linaclotide  (LINZESS ) 290 MCG CAPS capsule Take 1 capsule (290 mcg total) by mouth daily before breakfast. 30 capsule    methocarbamol  (ROBAXIN ) 500 MG tablet Take 1 tablet (500 mg total) by mouth every 6 (six) hours as needed for muscle spasms. 20 tablet 0   mirtazapine  (REMERON  SOL-TAB) 15 MG disintegrating tablet Take 15 mg by mouth at bedtime.     MOLNUPIRAVIR PO Take 800 mg by mouth 2 (two) times daily.     Multiple Vitamin (MULTIVITAMIN) capsule Take 1 capsule by mouth daily.     naloxone (NARCAN) nasal spray 4 mg/0.1 mL Place 1 spray into the nose once.     Nystatin  (GERHARDT'S BUTT CREAM) CREA Apply 1 application topically as needed for irritation.     ondansetron  (ZOFRAN -ODT) 4 MG disintegrating tablet Take 1 tablet (4 mg total) by mouth every 8 (eight) hours as needed for nausea or vomiting. 30 tablet 0   oxyCODONE  (OXY IR/ROXICODONE ) 5 MG immediate release tablet Take 1 tablet (5 mg total) by mouth every 12 (twelve) hours as needed for severe pain (pain score 7-10). 20 tablet 0   pantoprazole  (PROTONIX ) 40 MG tablet Take 1 tablet (40 mg total) by mouth 2 (two) times daily. 60 tablet 2   polyethylene glycol (MIRALAX  / GLYCOLAX ) 17 g packet Take 17 g by mouth 2 (two) times daily. 14 each 0   predniSONE  (DELTASONE ) 10 MG tablet Take 1 tablet (10 mg total) by mouth daily with breakfast.     pregabalin  (LYRICA ) 75 MG capsule Take 1 capsule (75 mg total) by mouth 3 (three) times daily. 60 capsule 0   Semaglutide-Weight Management (WEGOVY) 0.5 MG/0.5ML SOAJ Inject 0.5 mg into the skin every 7 (seven) days.     senna-docusate (SENOKOT-S) 8.6-50 MG tablet Take 1 tablet by mouth at bedtime as needed for mild constipation.     venlafaxine (EFFEXOR) 37.5 MG tablet Take 37.5 mg by mouth daily.     vitamin B-12 (CYANOCOBALAMIN ) 500 MCG tablet Take 1 tablet (500 mcg total) by mouth daily. 30 tablet 2   No current  facility-administered medications for this visit.   No results found.  Review of Systems:   A ROS was performed including pertinent positives and negatives as documented in the HPI.  Physical Exam :   Constitutional: NAD and appears stated age Neurological: Alert and oriented Psych: Appropriate affect and cooperative There were no vitals taken for this visit.   Comprehensive Musculoskeletal Exam:  Musculoskeletal Exam  Gait Normal  Alignment Normal   Right Left  Inspection Normal Normal  Palpation    Tenderness None None  Crepitus None None  Effusion None None  Range of Motion    Extension -3 -3  Flexion 135 135  Strength    Extension 5/5 5/5  Flexion 5/5 5/5  Ligament Exam     Generalized Laxity No No  Lachman Negative Positive  Pivot Shift Negative Negative  Anterior Drawer Negative Positive  Valgus at 0 Negative Negative  Valgus at 20 Negative Negative  Varus at 0 0 0  Varus at 20   0 0  Posterior Drawer at 90 0 0  Vascular/Lymphatic Exam    Edema None None  Venous Stasis Changes No No  Distal Circulation Normal Normal  Neurologic    Light Touch Sensation Intact Intact  Special Tests: Positive posterior drawer     Imaging:   Xray (4 views left knee): No evidence of residual instability  MRI (left knee): Limited by motion artifact although there is complete tear of the ACL as well as MCL with a proximal avulsion of the PCL  I personally reviewed and interpreted the radiographs.   Assessment:   33 y.o. female with multi ligamentous knee injury after a knee dislocation 2 years prior.  Unfortunately she has now had a recurrence of her dislocation.  Given this I would like to obtain an MRI of the left knee to assess for any type of additional tearing that would lead her to having an additional dislocation.  I will plan to proceed with this.  I will see her back following discuss results.  We also plan for referral to neurology as she will need clearance  from Guillain-Barr perspective. Plan :    - Plan for MRI left knee and follow-up to discuss results     I personally saw and evaluated the patient, and participated in the management and treatment plan.  Wilhelmenia Harada, MD Attending Physician, Orthopedic Surgery  This document was dictated using Dragon voice recognition software. A reasonable attempt at proof reading has been made to minimize errors.

## 2023-09-18 ENCOUNTER — Ambulatory Visit (HOSPITAL_BASED_OUTPATIENT_CLINIC_OR_DEPARTMENT_OTHER): Admitting: Orthopaedic Surgery

## 2023-09-20 ENCOUNTER — Ambulatory Visit (HOSPITAL_COMMUNITY)
Admission: RE | Admit: 2023-09-20 | Discharge: 2023-09-20 | Disposition: A | Discharge status: Home / Self Care | Source: Ambulatory Visit | Attending: Orthopaedic Surgery | Admitting: Orthopaedic Surgery

## 2023-09-20 DIAGNOSIS — M23612 Other spontaneous disruption of anterior cruciate ligament of left knee: Secondary | ICD-10-CM | POA: Diagnosis present

## 2023-09-25 ENCOUNTER — Ambulatory Visit (HOSPITAL_BASED_OUTPATIENT_CLINIC_OR_DEPARTMENT_OTHER): Admitting: Orthopaedic Surgery

## 2023-10-04 ENCOUNTER — Ambulatory Visit (HOSPITAL_BASED_OUTPATIENT_CLINIC_OR_DEPARTMENT_OTHER): Admitting: Orthopaedic Surgery

## 2023-10-09 ENCOUNTER — Ambulatory Visit (HOSPITAL_BASED_OUTPATIENT_CLINIC_OR_DEPARTMENT_OTHER): Admitting: Orthopaedic Surgery

## 2023-10-09 ENCOUNTER — Ambulatory Visit (INDEPENDENT_AMBULATORY_CARE_PROVIDER_SITE_OTHER): Payer: Medicare (Managed Care) | Admitting: Orthopaedic Surgery

## 2023-10-09 ENCOUNTER — Ambulatory Visit (HOSPITAL_BASED_OUTPATIENT_CLINIC_OR_DEPARTMENT_OTHER): Payer: Self-pay | Admitting: Orthopaedic Surgery

## 2023-10-09 DIAGNOSIS — M23612 Other spontaneous disruption of anterior cruciate ligament of left knee: Secondary | ICD-10-CM | POA: Diagnosis not present

## 2023-10-09 NOTE — Progress Notes (Signed)
 Chief Complaint: Left knee instability     History of Present Illness:   10/09/2023: Presents today for follow-up of her left knee.  Unfortunately she has had a residual dislocation of the left knee.  She is placed in a knee immobilizer and is here for further discussion  Carla Little is a 33 y.o. female presents as a referral from a partner Dr. Vernetta for ongoing left knee issues.  She does not have any pain in the left knee but does occasionally feel like giving out.  She is status post left knee dislocation in 2022 which was subsequently closed reduced and treated with Dr. Vernetta.  She has subsequently developed Guillain-Barr syndrome and as result has had to essentially relearn how to walk.  She has recently obtained a stability brace for this left knee.  She states that she is now walking with a walker but as she has gotten more active she does occasionally feel like the knee gives out.  She is not having pain in the knee.    Surgical History:   None  PMH/PSH/Family History/Social History/Meds/Allergies:    Past Medical History:  Diagnosis Date   Class 3 obesity 12/09/2020   Depression    GERD (gastroesophageal reflux disease)    Guillain Barr syndrome (HCC)    Nonalcoholic steatohepatitis (NASH) 12/09/2020   Obesity    PUD (peptic ulcer disease) 12/09/2020   Past Surgical History:  Procedure Laterality Date   BIOPSY  11/19/2020   Procedure: BIOPSY;  Surgeon: Kristie Lamprey, MD;  Location: [facility];  Service: Endoscopy;;   ESOPHAGOGASTRODUODENOSCOPY N/A 01/10/2021   normal   ESOPHAGOGASTRODUODENOSCOPY (EGD) WITH PROPOFOL  N/A 11/19/2020   LA Grade B reflux esophagitis, one small non-bleeding gastric ulcer in antrum s/p biopsy. Negative h.pylori.   FLEXIBLE SIGMOIDOSCOPY N/A 01/10/2021   Colonoscopy attempted Oct 2022 by Dr. Elicia but prep was poor. Stool in rectum and rectosigmoid colon.   Social History   Socioeconomic  History   Marital status: Single    Spouse name: Not on file   Number of children: Not on file   Years of education: Not on file   Highest education level: Not on file  Occupational History   Not on file  Tobacco Use   Smoking status: Never   Smokeless tobacco: Never  Vaping Use   Vaping status: Never Used  Substance and Sexual Activity   Alcohol use: Not Currently   Drug use: No   Sexual activity: Not on file  Other Topics Concern   Not on file  Social History Narrative   Not on file   Social Drivers of Health   Financial Resource Strain: Not on file  Food Insecurity: Not on file  Transportation Needs: Not on file  Physical Activity: Not on file  Stress: Not on file  Social Connections: Not on file   Family History  Problem Relation Age of Onset   Hypertension Other    Colon cancer Maternal Aunt    Colon polyps Neg Hx    Allergies  Allergen Reactions   Azithromycin  Shortness Of Breath and Nausea And Vomiting   Current Outpatient Medications  Medication Sig Dispense Refill   acetaminophen  (TYLENOL ) 325 MG tablet Take 2 tablets (650 mg total) by mouth every 6 (six) hours as needed for mild pain (or Fever >/=  101).     ascorbic acid  (VITAMIN C) 500 MG tablet Take 500 mg by mouth daily.     buPROPion  (WELLBUTRIN  SR) 150 MG 12 hr tablet Take 150 mg by mouth daily.     busPIRone  (BUSPAR ) 5 MG tablet Take 1 tablet (5 mg total) by mouth 2 (two) times daily.     cholecalciferol  (VITAMIN D ) 25 MCG tablet Take 2 tablets (2,000 Units total) by mouth daily. (Patient taking differently: Take 1,000 Units by mouth daily.) 30 tablet 1   cyanocobalamin  (,VITAMIN B-12,) 1000 MCG/ML injection Inject 1 mL (1,000 mcg total) into the muscle every 30 (thirty) days. 1 mL 0   folic acid  (FOLVITE ) 1 MG tablet Take 1 tablet (1 mg total) by mouth daily.     hydrOXYzine  (ATARAX ) 25 MG tablet Take 1 tablet (25 mg total) by mouth every 8 (eight) hours as needed for anxiety. 20 tablet 0    lansoprazole (PREVACID) 30 MG capsule Take 30 mg by mouth daily at 12 noon.     linaclotide  (LINZESS ) 290 MCG CAPS capsule Take 1 capsule (290 mcg total) by mouth daily before breakfast. 30 capsule    methocarbamol  (ROBAXIN ) 500 MG tablet Take 1 tablet (500 mg total) by mouth every 6 (six) hours as needed for muscle spasms. 20 tablet 0   mirtazapine  (REMERON  SOL-TAB) 15 MG disintegrating tablet Take 15 mg by mouth at bedtime.     MOLNUPIRAVIR PO Take 800 mg by mouth 2 (two) times daily.     Multiple Vitamin (MULTIVITAMIN) capsule Take 1 capsule by mouth daily.     naloxone (NARCAN) nasal spray 4 mg/0.1 mL Place 1 spray into the nose once.     Nystatin  (GERHARDT'S BUTT CREAM) CREA Apply 1 application topically as needed for irritation.     ondansetron  (ZOFRAN -ODT) 4 MG disintegrating tablet Take 1 tablet (4 mg total) by mouth every 8 (eight) hours as needed for nausea or vomiting. 30 tablet 0   oxyCODONE  (OXY IR/ROXICODONE ) 5 MG immediate release tablet Take 1 tablet (5 mg total) by mouth every 12 (twelve) hours as needed for severe pain (pain score 7-10). 20 tablet 0   pantoprazole  (PROTONIX ) 40 MG tablet Take 1 tablet (40 mg total) by mouth 2 (two) times daily. 60 tablet 2   polyethylene glycol (MIRALAX  / GLYCOLAX ) 17 g packet Take 17 g by mouth 2 (two) times daily. 14 each 0   predniSONE  (DELTASONE ) 10 MG tablet Take 1 tablet (10 mg total) by mouth daily with breakfast.     pregabalin  (LYRICA ) 75 MG capsule Take 1 capsule (75 mg total) by mouth 3 (three) times daily. 60 capsule 0   Semaglutide-Weight Management (WEGOVY) 0.5 MG/0.5ML SOAJ Inject 0.5 mg into the skin every 7 (seven) days.     senna-docusate (SENOKOT-S) 8.6-50 MG tablet Take 1 tablet by mouth at bedtime as needed for mild constipation.     venlafaxine (EFFEXOR) 37.5 MG tablet Take 37.5 mg by mouth daily.     vitamin B-12 (CYANOCOBALAMIN ) 500 MCG tablet Take 1 tablet (500 mcg total) by mouth daily. 30 tablet 2   No current  facility-administered medications for this visit.   No results found.  Review of Systems:   A ROS was performed including pertinent positives and negatives as documented in the HPI.  Physical Exam :   Constitutional: NAD and appears stated age Neurological: Alert and oriented Psych: Appropriate affect and cooperative There were no vitals taken for this visit.   Comprehensive Musculoskeletal Exam:  Musculoskeletal Exam  Gait Normal  Alignment Normal   Right Left  Inspection Normal Normal  Palpation    Tenderness None None  Crepitus None None  Effusion None None  Range of Motion    Extension -3 -3  Flexion 135 135  Strength    Extension 5/5 5/5  Flexion 5/5 5/5  Ligament Exam     Generalized Laxity No No  Lachman Negative Positive  Pivot Shift Negative Negative  Anterior Drawer Negative Positive  Valgus at 0 Negative Negative  Valgus at 20 Negative Negative  Varus at 0 0 0  Varus at 20   0 0  Posterior Drawer at 90 0 0  Vascular/Lymphatic Exam    Edema None None  Venous Stasis Changes No No  Distal Circulation Normal Normal  Neurologic    Light Touch Sensation Intact Intact  Special Tests: Positive anterior drawer, laxity with varus     Imaging:   Xray (4 views left knee): No evidence of residual instability  MRI (left knee): Limited by motion artifact although there is complete tear of the ACL as well as posterior lateral corner with a proximal avulsion of the PCL, lateral patella subluxation  I personally reviewed and interpreted the radiographs.   Assessment:   32 y.o. female with multi ligamentous knee injury after a knee dislocation 2 years prior.  Unfortunately she has now had a recurrence of her dislocation.  At this time I did discuss that unfortunately she is unlikely to regain quality of life with a highly unstable knee.  At this time she has clicking and popping with instability with any weight on the knee.  Given this discuss treatment  options.  Overall I did discuss that this is quite a difficult problem with her weight.  That being said I do believe she would be a candidate for knee reconstruction given the fact that she is still so limited.  I did discuss that I would specifically recommend an anterior cruciate ligament allograft with a posterior lateral corner repair as well as an MPFL reconstruction with repair.  I did discuss the risks and limitations.  I did discuss that I would recommend overnight observation should be pursue this.  After discussion of all of this she would like to proceed Plan :    - Plan for left knee arthroscopy with anterior cruciate ligament reconstruction with quadriceps allograft, posterior lateral corner repair, MPFL reconstruction with repair    After a lengthy discussion of treatment options, including risks, benefits, alternatives, complications of surgical and nonsurgical conservative options, the patient elected surgical repair.   The patient  is aware of the material risks  and complications including, but not limited to injury to adjacent structures, neurovascular injury, infection, numbness, bleeding, implant failure, thermal burns, stiffness, persistent pain, failure to heal, disease transmission from allograft, need for further surgery, dislocation, anesthetic risks, blood clots, risks of death,and others. The probabilities of surgical success and failure discussed with patient given their particular co-morbidities.The time and nature of expected rehabilitation and recovery was discussed.The patient's questions were all answered preoperatively.  No barriers to understanding were noted. I explained the natural history of the disease process and Rx rationale.  I explained to the patient what I considered to be reasonable expectations given their personal situation.  The final treatment plan was arrived at through a shared patient decision making process model.      I personally saw and  evaluated the patient, and participated in the management and treatment  plan.  Elspeth Parker, MD Attending Physician, Orthopedic Surgery  This document was dictated using Dragon voice recognition software. A reasonable attempt at proof reading has been made to minimize errors.

## 2023-10-21 ENCOUNTER — Emergency Department (HOSPITAL_COMMUNITY)
Admission: EM | Admit: 2023-10-21 | Discharge: 2023-10-21 | Disposition: A | Discharge status: Home / Self Care | Payer: Medicare (Managed Care) | Source: Skilled Nursing Facility | Attending: Emergency Medicine | Admitting: Emergency Medicine

## 2023-10-21 ENCOUNTER — Encounter (HOSPITAL_COMMUNITY): Payer: Self-pay

## 2023-10-21 ENCOUNTER — Emergency Department (HOSPITAL_COMMUNITY): Payer: Medicare (Managed Care)

## 2023-10-21 ENCOUNTER — Other Ambulatory Visit: Payer: Self-pay

## 2023-10-21 DIAGNOSIS — S80212A Abrasion, left knee, initial encounter: Secondary | ICD-10-CM | POA: Diagnosis not present

## 2023-10-21 DIAGNOSIS — S80211A Abrasion, right knee, initial encounter: Secondary | ICD-10-CM | POA: Insufficient documentation

## 2023-10-21 DIAGNOSIS — W06XXXA Fall from bed, initial encounter: Secondary | ICD-10-CM | POA: Diagnosis not present

## 2023-10-21 DIAGNOSIS — M25561 Pain in right knee: Secondary | ICD-10-CM

## 2023-10-21 MED ORDER — LIDOCAINE 5 % EX PTCH
1.0000 | MEDICATED_PATCH | CUTANEOUS | Status: DC
  Administered 2023-10-21: 1 via TRANSDERMAL
  Filled 2023-10-21: qty 1

## 2023-10-21 MED ORDER — HYDROCODONE-ACETAMINOPHEN 5-325 MG PO TABS
1.0000 | ORAL_TABLET | Freq: Once | ORAL | Status: AC
  Administered 2023-10-21: 1 via ORAL
  Filled 2023-10-21: qty 1

## 2023-10-21 MED ORDER — LIDOCAINE 5 % EX PTCH
1.0000 | MEDICATED_PATCH | CUTANEOUS | 0 refills | Status: DC
Start: 2023-10-21 — End: 2023-10-21

## 2023-10-21 MED ORDER — LIDOCAINE 5 % EX PTCH: 1.0000 | MEDICATED_PATCH | CUTANEOUS | 0 refills | Status: DC

## 2023-10-21 NOTE — ED Triage Notes (Signed)
 Pt arrived via [facility] from [facility] c/o bilateral knee pain and swelling and bruising from a fall out of her bed. Pt reports right knee hurts more than left. Pt reports she was rolling on the bed to adjust herself when she accidentally slipped off the bed on to the floor. Pt reports recent falls from SNF bed as well reporting the beds are not safe fr her at the SNF.

## 2023-10-21 NOTE — ED Notes (Signed)
 X-ray at bedside

## 2023-10-21 NOTE — ED Provider Notes (Signed)
 [place] EMERGENCY DEPARTMENT AT [facility] Provider Note   CSN: [id number] Arrival date & time: 10/21/23  9086     Patient presents with: Carla Little   Carla Little is a 33 y.o. female. She presents from SNF for evaluation of fall.  She fell from bed today when trying to get out of bed. She is having bilateral knee pain R>L. No head injury, no LOC, no decreased sensation or decreased strength.      Fall       Prior to Admission medications   Medication Sig Start Date End Date Taking? Authorizing Provider  lidocaine  (LIDODERM ) 5 % Place 1 patch onto the skin daily. Remove & Discard patch within 12 hours or as directed by MD 10/21/23  Yes Suellen, Estes Lehner A, PA-C  acetaminophen  (TYLENOL ) 325 MG tablet Take 2 tablets (650 mg total) by mouth every 6 (six) hours as needed for mild pain (or Fever >/= 101). 01/26/21   Jens Durand, MD  ascorbic acid  (VITAMIN C) 500 MG tablet Take 500 mg by mouth daily.    [provider]  buPROPion  (WELLBUTRIN  SR) 150 MG 12 hr tablet Take 150 mg by mouth daily.    [provider]  busPIRone  (BUSPAR ) 5 MG tablet Take 1 tablet (5 mg total) by mouth 2 (two) times daily. 01/26/21   Jens Durand, MD  cholecalciferol  (VITAMIN D ) 25 MCG tablet Take 2 tablets (2,000 Units total) by mouth daily. Patient taking differently: Take 1,000 Units by mouth daily. 11/28/20   Leotis Bogus, MD  cyanocobalamin  (,VITAMIN B-12,) 1000 MCG/ML injection Inject 1 mL (1,000 mcg total) into the muscle every 30 (thirty) days. 01/29/21   Jens Durand, MD  folic acid  (FOLVITE ) 1 MG tablet Take 1 tablet (1 mg total) by mouth daily. 01/27/21   Jens Durand, MD  hydrOXYzine  (ATARAX ) 25 MG tablet Take 1 tablet (25 mg total) by mouth every 8 (eight) hours as needed for anxiety. 04/28/21   Ricky Fines, MD  lansoprazole (PREVACID) 30 MG capsule Take 30 mg by mouth daily at 12 noon.    [provider]  linaclotide  (LINZESS ) 290 MCG CAPS capsule Take 1  capsule (290 mcg total) by mouth daily before breakfast. 04/18/21   Antoinette Doe, MD  methocarbamol  (ROBAXIN ) 500 MG tablet Take 1 tablet (500 mg total) by mouth every 6 (six) hours as needed for muscle spasms. 11/28/20   Leotis Bogus, MD  mirtazapine  (REMERON  SOL-TAB) 15 MG disintegrating tablet Take 15 mg by mouth at bedtime.    [provider]  MOLNUPIRAVIR PO Take 800 mg by mouth 2 (two) times daily.    [provider]  Multiple Vitamin (MULTIVITAMIN) capsule Take 1 capsule by mouth daily.    [provider]  naloxone [facility]) nasal spray 4 mg/0.1 mL Place 1 spray into the nose once.    [provider]  Nystatin  (GERHARDT'S BUTT CREAM) CREA Apply 1 application topically as needed for irritation. 04/17/21   Antoinette Doe, MD  ondansetron  (ZOFRAN -ODT) 4 MG disintegrating tablet Take 1 tablet (4 mg total) by mouth every 8 (eight) hours as needed for nausea or vomiting. 04/28/21   Ricky Fines, MD  oxyCODONE  (OXY IR/ROXICODONE ) 5 MG immediate release tablet Take 1 tablet (5 mg total) by mouth every 12 (twelve) hours as needed for severe pain (pain score 7-10). 08/31/23   [name], MD  pantoprazole  (PROTONIX ) 40 MG tablet Take 1 tablet (40 mg total) by mouth 2 (two) times daily. 04/28/21  Ricky Fines, MD  polyethylene glycol (MIRALAX  / GLYCOLAX ) 17 g packet Take 17 g by mouth 2 (two) times daily. 04/17/21   Antoinette Doe, MD  predniSONE  (DELTASONE ) 10 MG tablet Take 1 tablet (10 mg total) by mouth daily with breakfast. 04/29/21   Ricky Fines, MD  pregabalin  (LYRICA ) 75 MG capsule Take 1 capsule (75 mg total) by mouth 3 (three) times daily. 01/26/21   Jens Durand, MD  Semaglutide-Weight Management (WEGOVY) 0.5 MG/0.5ML SOAJ Inject 0.5 mg into the skin every 7 (seven) days.    [provider]  senna-docusate (SENOKOT-S) 8.6-50 MG tablet Take 1 tablet by mouth at bedtime as needed for mild constipation. 01/26/21   Jens Durand, MD   venlafaxine (EFFEXOR) 37.5 MG tablet Take 37.5 mg by mouth daily.    [provider]  vitamin B-12 (CYANOCOBALAMIN ) 500 MCG tablet Take 1 tablet (500 mcg total) by mouth daily. 04/29/21   Ricky Fines, MD    Allergies: Azithromycin     Review of Systems  Updated Vital Signs BP 104/65   Pulse 90   Temp 98.1 F (36.7 C) (Oral)   Resp (!) 22   Ht 5' 4 (1.626 m)   Wt (!) 192.3 kg   SpO2 95%   BMI 72.77 kg/m   Physical Exam Vitals and nursing note reviewed.  Constitutional:      General: She is not in acute distress.    Appearance: She is well-developed.  HENT:     Head: Normocephalic and atraumatic.     Mouth/Throat:     Mouth: Mucous membranes are moist.  Eyes:     Conjunctiva/sclera: Conjunctivae normal.     Pupils: Pupils are equal, round, and reactive to light.  Cardiovascular:     Rate and Rhythm: Normal rate and regular rhythm.     Heart sounds: No murmur heard. Pulmonary:     Effort: Pulmonary effort is normal. No respiratory distress.     Breath sounds: Normal breath sounds.  Abdominal:     Palpations: Abdomen is soft.     Tenderness: There is no abdominal tenderness.  Musculoskeletal:        General: No swelling.     Cervical back: Neck supple.     Comments: Right knee has moderate diffuse tenderness.  DP and PT pulses are intact distally.  No calf tenderness.  Exam is somewhat limited due to to body habitus but no obvious laxity on Lachman's test  Left knee has no significant redness, patient reports range of motion is at baseline    Skin:    General: Skin is warm and dry.     Capillary Refill: Capillary refill takes less than 2 seconds.     Comments: Mild abrasions of bilateral anterior knees  Neurological:     General: No focal deficit present.     Mental Status: She is alert and oriented to person, place, and time.  Psychiatric:        Mood and Affect: Mood normal.     (all labs ordered are listed, but only abnormal results are  displayed) Labs Reviewed - No data to display  EKG: None  Radiology: DG Knee Complete 4 Views Left Result Date: 10/21/2023 CLINICAL DATA:  Fall. EXAM: LEFT KNEE - COMPLETE 4+ VIEW COMPARISON:  08/31/2023. FINDINGS: Similar lateral subluxation of tibia and patella. Chronic fragments again noted adjacent to the medial femoral condyle, may reflect sequela of prior ligamentous injury. No acute fracture. Small joint effusion. IMPRESSION: 1. No acute fracture. Similar  lateral subluxation of the tibia and patella. 2. Small joint effusion. Electronically Signed   By: Harrietta Sherry M.D.   On: 10/21/2023 11:20   DG Knee Complete 4 Views Right Result Date: 10/21/2023 CLINICAL DATA:  Fall. EXAM: RIGHT KNEE - COMPLETE 4+ VIEW COMPARISON:  None Available. FINDINGS: No evidence of acute fracture, dislocation, or sizable joint effusion. No evidence of significant arthropathy. Soft tissues are unremarkable. IMPRESSION: No acute osseous abnormality. Electronically Signed   By: Harrietta Sherry M.D.   On: 10/21/2023 11:18     Procedures   Medications Ordered in the ED  lidocaine  (LIDODERM ) 5 % 1 patch (1 patch Transdermal Patch Applied 10/21/23 1202)  HYDROcodone -acetaminophen  (NORCO/VICODIN) 5-325 MG per tablet 1 tablet (1 tablet Oral Given 10/21/23 1048)                                    Medical Decision Making Ddx: fracture, sprain, strain, contusion, abrasion, dislocation, other  ED course: Patient here for primarily right knee pain after a fall from bed today.  This is a mechanical fall, she states at baseline she has not been walking at all as she needs reconstruction of the left knee.  I reviewed her prior notes from orthopedics who is planning on doing knee reconstruction on her left knee.  Today her primary pain is in the right knee.  X-rays right knee showed no acute injury, left knee show chronic images.  I viewed and interpreted these images myself, agree with radiology read.  Patient feels  better after lidocaine  patch and Norco.  Since she states she has not been walking at all on the side she does not need an immobilizer or brace.  Will have her follow-up with orthopedic doctor.  Advised on return precautions.  Amount and/or Complexity of Data Reviewed External Data Reviewed: notes. Radiology: ordered and independent interpretation performed. Decision-making details documented in ED Course.  Risk Prescription drug management.        Final diagnoses:  Acute pain of right knee    ED Discharge Orders          Ordered    lidocaine  (LIDODERM ) 5 %  Every 24 hours        10/21/23 1256               Suellen Sherran LABOR, PA-C 10/21/23 1308    Elnor Savant A, DO 10/22/23 1332

## 2023-10-21 NOTE — Discharge Instructions (Signed)
 You are seen for knee pain after fall.  Fortunately your x-rays were reassuring.  Follow-up with your orthopedic doctor.  Come back to the ER for new or worsening symptoms.

## 2023-10-30 ENCOUNTER — Telehealth: Payer: Self-pay | Admitting: Orthopaedic Surgery

## 2023-11-01 ENCOUNTER — Other Ambulatory Visit (HOSPITAL_COMMUNITY): Payer: Self-pay

## 2023-11-01 DIAGNOSIS — R000 Tachycardia, unspecified: Secondary | ICD-10-CM

## 2023-11-17 IMAGING — MR MR CERVICAL SPINE WO/W CM
4 of 8 series · 22 of 48 positions shown · IV contrast (gadavist)
Comparison: None.

EXAM:
MRI CERVICAL SPINE WITHOUT AND WITH CONTRAST
TECHNIQUE: Multiplanar and multiecho pulse sequences of the cervical spine, to
include the craniocervical junction and cervicothoracic junction,
were obtained without and with intravenous contrast.

CONTRAST:  10mL GADAVIST GADOBUTROL 1 MMOL/ML IV SOLN

[Series 7: STIR · sagittal · 3.0mm · 0.86mm/px · 4 of 15 slices shown]
[im 1/15]
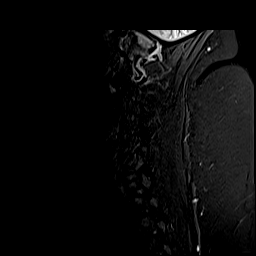
[im 5/15]
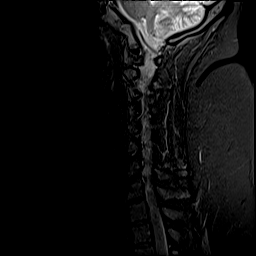
[im 10/15]
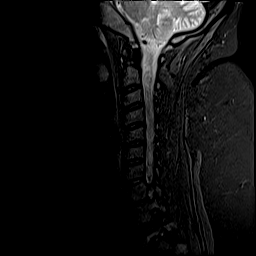
[im 15/15]
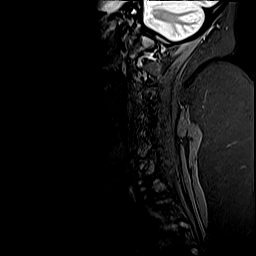

[Series 10: T1 · axial · non-contrast · 3.0mm · 0.35mm/px · z∈[-211,-103]mm · 8 of 34 slices shown]
[im 1/34]
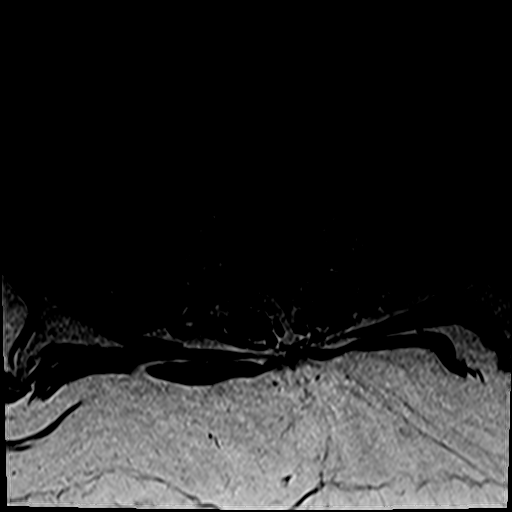
[im 5/34]
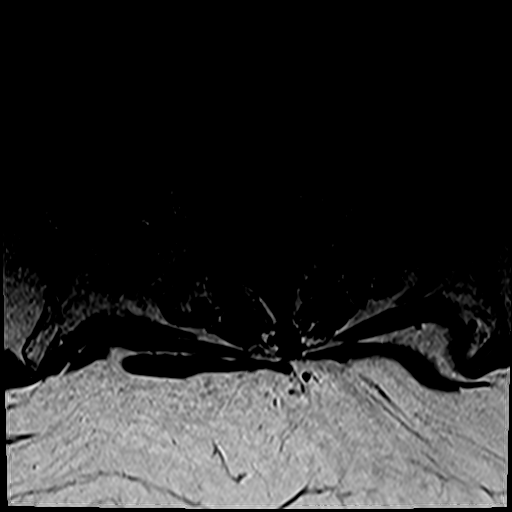
[im 10/34]
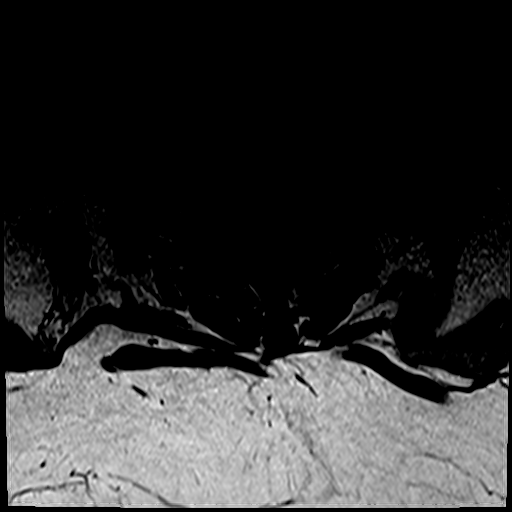
[im 15/34]
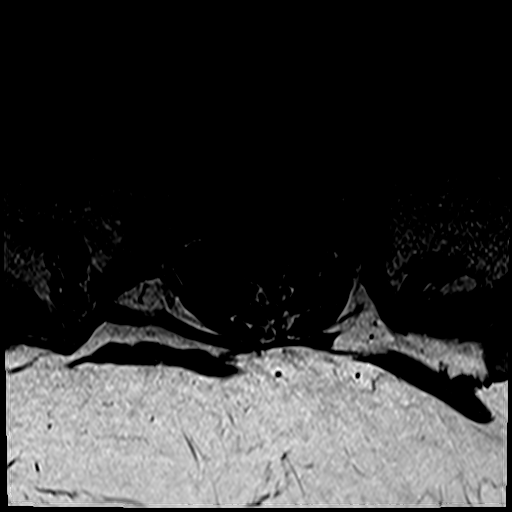
[im 19/34]
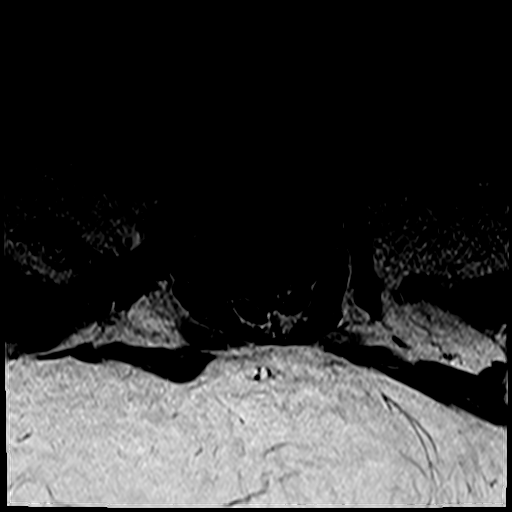
[im 24/34]
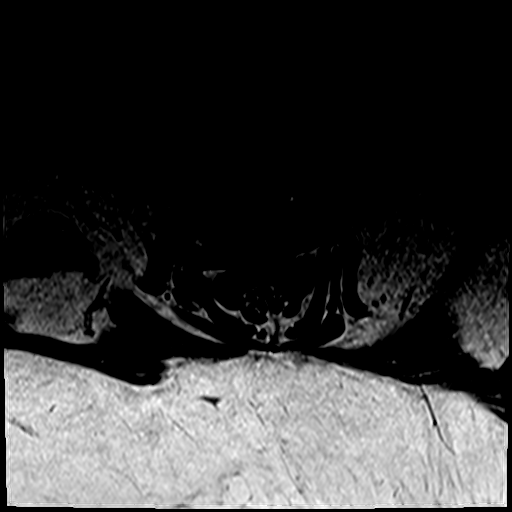
[im 29/34]
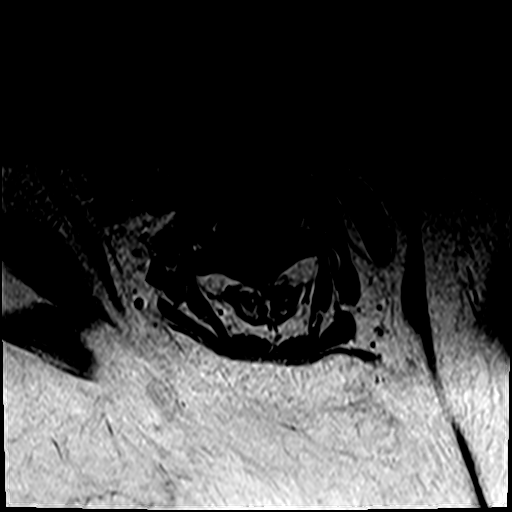
[im 34/34]
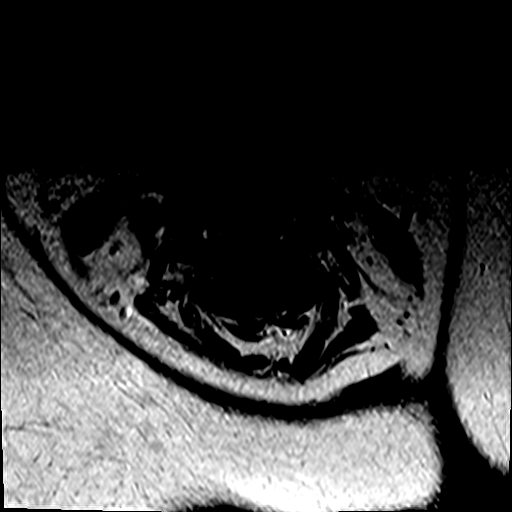

[Series 11: T1 post-contrast · sagittal · 3.0mm · 0.43mm/px · 4 of 15 slices shown (1 of 2)]
[im 1/15]
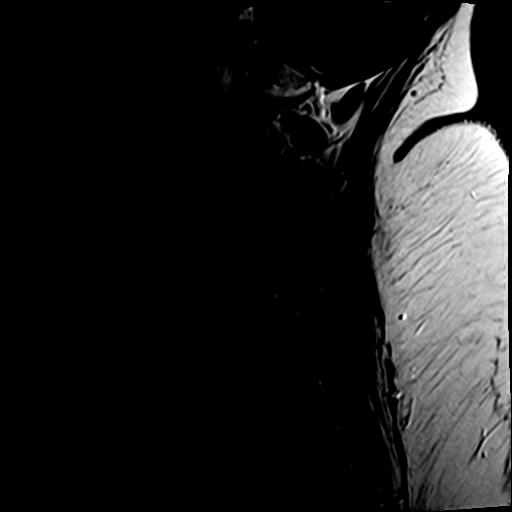
[im 5/15]
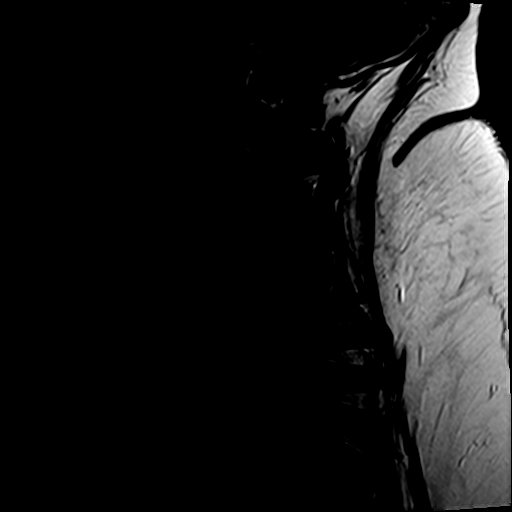
[im 10/15]
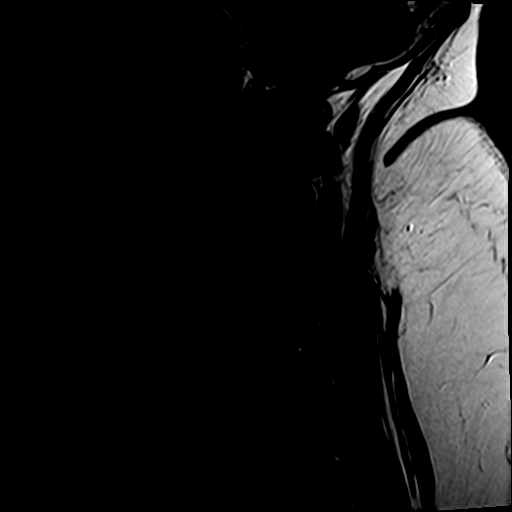
[im 15/15]
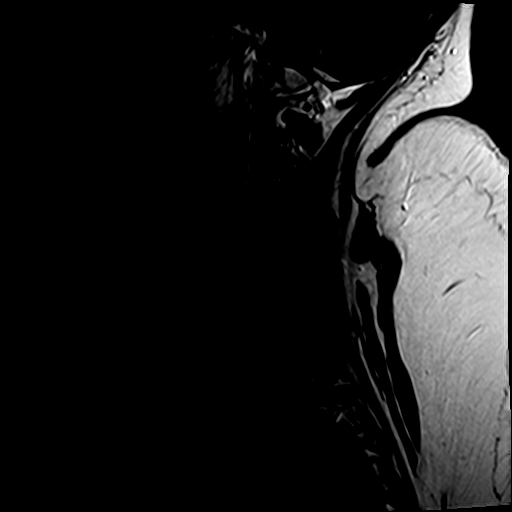

[Series 12: T1 post-contrast · axial · 3.0mm · 0.35mm/px · z∈[-211,-119]mm · 6 of 34 slices shown (2 of 2)]
[im 1/34]
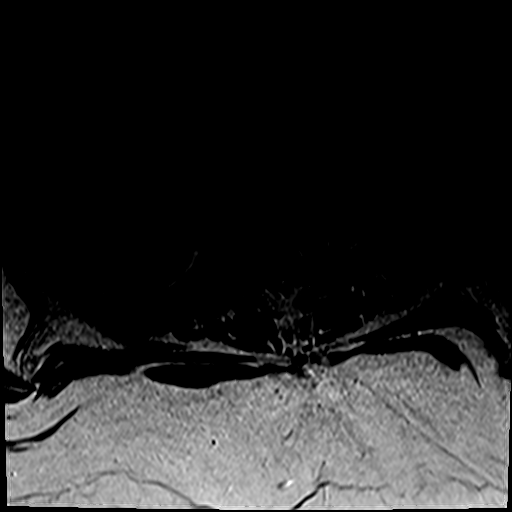
[im 5/34]
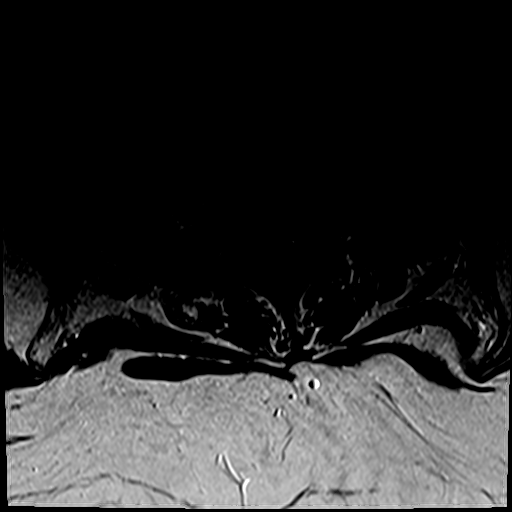
[im 10/34]
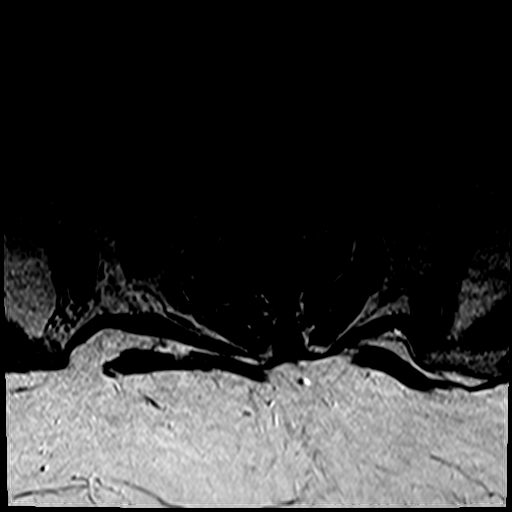
[im 15/34]
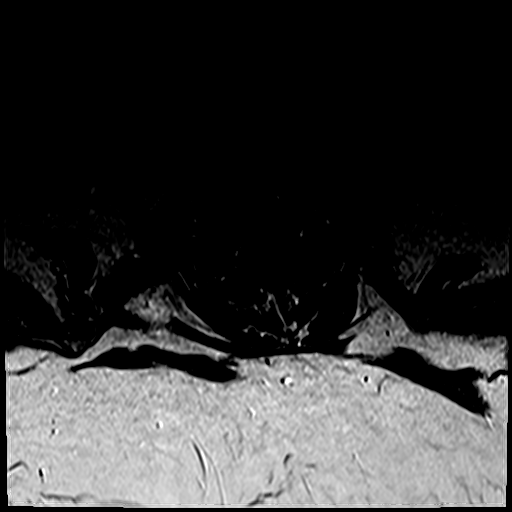
[im 19/34]
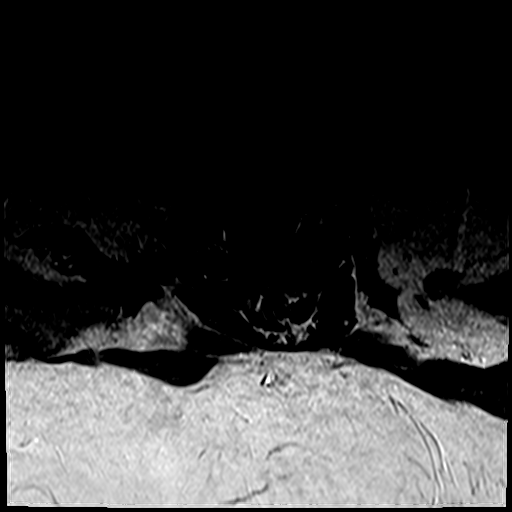
[im 29/34]
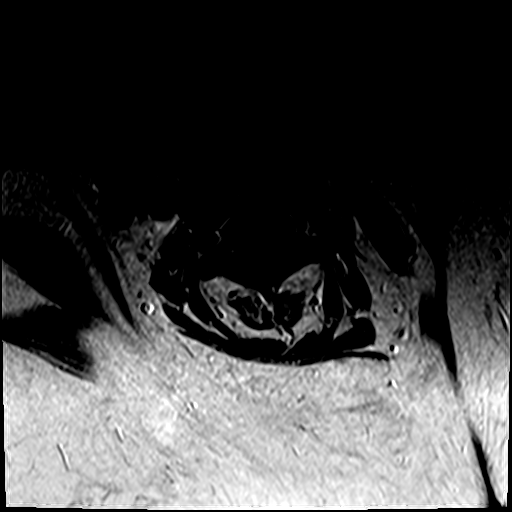

[22 of 48 positions shown; findings below may reference images not displayed]

FINDINGS: Alignment: Physiologic.

Vertebrae: No fracture, evidence of discitis, or bone lesion.

Cord: Normal signal and morphology.  No abnormal enhancement.

Posterior Fossa, vertebral arteries, paraspinal tissues: Negative.

Disc levels:

C2-3: No significant disc bulge. No neural foraminal stenosis. No
central canal stenosis.

C3-4: No significant disc bulge. No neural foraminal stenosis. No
central canal stenosis.

C4-5: No significant disc bulge. No neural foraminal stenosis. No
central canal stenosis.

C5-6: No significant disc bulge. No neural foraminal stenosis. No
central canal stenosis.

C6-7: No significant disc bulge. No neural foraminal stenosis. No
central canal stenosis.

C7-T1: No significant disc bulge. No neural foraminal stenosis. No
central canal stenosis.
IMPRESSION: Normal examination.

## 2023-12-05 ENCOUNTER — Telehealth (HOSPITAL_BASED_OUTPATIENT_CLINIC_OR_DEPARTMENT_OTHER): Payer: Self-pay | Admitting: Orthopaedic Surgery

## 2023-12-05 NOTE — Telephone Encounter (Signed)
 Patient need clarence forms sent back to nursing home because they lost them. Chauvin (p) [phone] (f) [phone]

## 2023-12-06 NOTE — Telephone Encounter (Signed)
 Surgical clearance form was faxed again to [facility] at [phone].

## 2023-12-16 ENCOUNTER — Other Ambulatory Visit (HOSPITAL_BASED_OUTPATIENT_CLINIC_OR_DEPARTMENT_OTHER): Payer: Self-pay | Admitting: Orthopaedic Surgery

## 2023-12-16 DIAGNOSIS — M23612 Other spontaneous disruption of anterior cruciate ligament of left knee: Secondary | ICD-10-CM

## 2023-12-19 ENCOUNTER — Telehealth (HOSPITAL_BASED_OUTPATIENT_CLINIC_OR_DEPARTMENT_OTHER): Payer: Self-pay | Admitting: Orthopaedic Surgery

## 2023-12-19 NOTE — Telephone Encounter (Signed)
 Carla Little out patient  PT [phone]  wants to verify PT orders for patient

## 2023-12-20 ENCOUNTER — Other Ambulatory Visit (HOSPITAL_BASED_OUTPATIENT_CLINIC_OR_DEPARTMENT_OTHER): Payer: Self-pay | Admitting: Orthopaedic Surgery

## 2023-12-20 DIAGNOSIS — M23612 Other spontaneous disruption of anterior cruciate ligament of left knee: Secondary | ICD-10-CM

## 2023-12-20 NOTE — Telephone Encounter (Signed)
 Returned call to [facility] as they do not replace bandages, I placed a new order for PT without bandage change

## 2023-12-20 NOTE — Progress Notes (Deleted)
 Initial neurology clinic note  Reason for Evaluation: Consultation requested by Genelle Standing, MD for an opinion regarding ***. My final recommendations will be communicated back to the requesting physician by way of shared medical record or letter to requesting physician via [place]  mail.  HPI: This is [name], a 33 y.o. ***-handed female with a medical history of left knee dislocation, NASH, PUD, depression, and obesity*** who presents to neurology clinic with the chief complaint of ***. The patient is accompanied by ***.  *** Sees Dr. Genelle in ortho for dislocation of left knee (since 2022) Dr. 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 Saw Dr. Onita in past but never got EMG as recommended Now walking with walker Had re-dislocation Sent to us  from ortho for clearance from GBS perspective for surgery  Per Dr. Georgianne note from 03/12/23: I was able to review neurohospitalist evaluation, initially by Dr. Michaela on December 20, 2020, patient was admitted left knee dislocation in August 2022 following a fall accident, then  complains of numbness tingling started since September 2022, was found to have sensory loss, areflexia, profound gait abnormality distal weakness MRI of the brain, cervical spine from December 20, 2020 was normal   Spinal fluid testing from December 21, 2020, showed elevated total protein of 57, glucose of 78, WBC of 1,   Extensive laboratory evaluations showed normal cortisone, B6, copper, vitamin E, B12 deficiency 153, folic acid  was decreased 3.2, slight elevation in free T4, mild elevation of TSH 5.2,   Treated with IVIG 2 g/kg x 5 days since December 21, 2020, repeat again in January 2023   She was started on prednisone  tapering dose, was not sure about the benefit, now on 10 mg daily   She denies significant improvement with IVIG treatment, was discharged to nursing home at [facility], her recovery is hindered by her weight issue,  left knee dislocation knee pain, also suffered left foot fracture   She now able to take few steps using walker, continue have numbness tingling in her lower extremity, denies significant upper extremity weakness, or sensory loss, denies bowel and bladder incontinence  On Lyrica  75 mg TID Effexor 37.5 mg daily Oxy Remeron  Wellbutrin   The patient has not*** had similar episodes of symptoms in the past. ***  Muscle bulk loss? *** Muscle pain? ***  Cramps/Twitching? *** Suggestion of myotonia/difficulty relaxing after contraction? ***  Fatigable weakness?*** Does strength improve after brief exercise?***  Able to brush hair/teeth without difficulty? *** Able to button shirts/use zips? *** Clumsiness/dropping grasped objects?*** Can you arise from squatted position easily? *** Able to get out of chair without using arms? *** Able to walk up steps easily? *** Use an assistive device to walk? *** Significant imbalance with walking? *** Falls?*** Any change in urine color, especially after exertion/physical activity? ***  The patient denies*** symptoms suggestive of oculobulbar weakness including diplopia, ptosis, dysphagia, poor saliva control, dysarthria/dysphonia, impaired mastication, facial weakness/droop.  There are no*** neuromuscular respiratory weakness symptoms, particularly orthopnea>dyspnea.   Pseudobulbar affect is absent***.  The patient does not*** report symptoms referable to autonomic dysfunction including impaired sweating, heat or cold intolerance, excessive mucosal dryness, gastroparetic early satiety, postprandial abdominal bloating, constipation, bowel or bladder dyscontrol, erectile dysfunction*** or syncope/presyncope/orthostatic intolerance.  There are no*** complaints relating to other symptoms of small fiber modalities including paresthesia/pain.  The patient has not *** noticed any recent skin rashes nor does he*** report any constitutional symptoms like fever,  night sweats, anorexia or unintentional  weight loss.  EtOH use: ***  Restrictive diet? *** Family history of neuropathy/myopathy/NM disease?***  Previous labs, electrodiagnostics, and neuroimaging are summarized below, but pertinent findings include***  Any biopsy done? *** Current medications being tried for the patient's symptoms include ***  Prior medications that have been tried: ***   MEDICATIONS:  Outpatient Encounter Medications as of 01/01/2024  Medication Sig   acetaminophen  (TYLENOL ) 325 MG tablet Take 2 tablets (650 mg total) by mouth every 6 (six) hours as needed for mild pain (or Fever >/= 101).   ascorbic acid  (VITAMIN C) 500 MG tablet Take 500 mg by mouth daily.   buPROPion  (WELLBUTRIN  SR) 150 MG 12 hr tablet Take 150 mg by mouth daily.   busPIRone  (BUSPAR ) 5 MG tablet Take 1 tablet (5 mg total) by mouth 2 (two) times daily.   cholecalciferol  (VITAMIN D ) 25 MCG tablet Take 2 tablets (2,000 Units total) by mouth daily. (Patient taking differently: Take 1,000 Units by mouth daily.)   cyanocobalamin  (,VITAMIN B-12,) 1000 MCG/ML injection Inject 1 mL (1,000 mcg total) into the muscle every 30 (thirty) days.   folic acid  (FOLVITE ) 1 MG tablet Take 1 tablet (1 mg total) by mouth daily.   hydrOXYzine  (ATARAX ) 25 MG tablet Take 1 tablet (25 mg total) by mouth every 8 (eight) hours as needed for anxiety.   lansoprazole (PREVACID) 30 MG capsule Take 30 mg by mouth daily at 12 noon.   lidocaine  (LIDODERM ) 5 % Place 1 patch onto the skin daily. Remove & Discard patch within 12 hours or as directed by MD   linaclotide  (LINZESS ) 290 MCG CAPS capsule Take 1 capsule (290 mcg total) by mouth daily before breakfast.   methocarbamol  (ROBAXIN ) 500 MG tablet Take 1 tablet (500 mg total) by mouth every 6 (six) hours as needed for muscle spasms.   mirtazapine  (REMERON  SOL-TAB) 15 MG disintegrating tablet Take 15 mg by mouth at bedtime.   MOLNUPIRAVIR PO Take 800 mg by mouth 2 (two) times daily.    Multiple Vitamin (MULTIVITAMIN) capsule Take 1 capsule by mouth daily.   naloxone (NARCAN) nasal spray 4 mg/0.1 mL Place 1 spray into the nose once.   Nystatin  (GERHARDT'S BUTT CREAM) CREA Apply 1 application topically as needed for irritation.   ondansetron  (ZOFRAN -ODT) 4 MG disintegrating tablet Take 1 tablet (4 mg total) by mouth every 8 (eight) hours as needed for nausea or vomiting.   oxyCODONE  (OXY IR/ROXICODONE ) 5 MG immediate release tablet Take 1 tablet (5 mg total) by mouth every 12 (twelve) hours as needed for severe pain (pain score 7-10).   pantoprazole  (PROTONIX ) 40 MG tablet Take 1 tablet (40 mg total) by mouth 2 (two) times daily.   polyethylene glycol (MIRALAX  / GLYCOLAX ) 17 g packet Take 17 g by mouth 2 (two) times daily.   predniSONE  (DELTASONE ) 10 MG tablet Take 1 tablet (10 mg total) by mouth daily with breakfast.   pregabalin  (LYRICA ) 75 MG capsule Take 1 capsule (75 mg total) by mouth 3 (three) times daily.   Semaglutide-Weight Management (WEGOVY) 0.5 MG/0.5ML SOAJ Inject 0.5 mg into the skin every 7 (seven) days.   senna-docusate (SENOKOT-S) 8.6-50 MG tablet Take 1 tablet by mouth at bedtime as needed for mild constipation.   venlafaxine (EFFEXOR) 37.5 MG tablet Take 37.5 mg by mouth daily.   vitamin B-12 (CYANOCOBALAMIN ) 500 MCG tablet Take 1 tablet (500 mcg total) by mouth daily.   No facility-administered encounter medications on file as of 01/01/2024.    PAST MEDICAL  HISTORY: Past Medical History:  Diagnosis Date   Class 3 obesity 12/09/2020   Depression    GERD (gastroesophageal reflux disease)    Guillain Barr syndrome    Nonalcoholic steatohepatitis (NASH) 12/09/2020   Obesity    PUD (peptic ulcer disease) 12/09/2020    PAST SURGICAL HISTORY: Past Surgical History:  Procedure Laterality Date   BIOPSY  11/19/2020   Procedure: BIOPSY;  Surgeon: Kristie Lamprey, MD;  Location: [facility];  Service: Endoscopy;;   ESOPHAGOGASTRODUODENOSCOPY N/A 01/10/2021    normal   ESOPHAGOGASTRODUODENOSCOPY (EGD) WITH PROPOFOL  N/A 11/19/2020   LA Grade B reflux esophagitis, one small non-bleeding gastric ulcer in antrum s/p biopsy. Negative h.pylori.   FLEXIBLE SIGMOIDOSCOPY N/A 01/10/2021   Colonoscopy attempted Oct 2022 by Dr. Elicia but prep was poor. Stool in rectum and rectosigmoid colon.    ALLERGIES: Allergies  Allergen Reactions   Azithromycin  Shortness Of Breath and Nausea And Vomiting    FAMILY HISTORY: Family History  Problem Relation Age of Onset   Hypertension Other    Colon cancer Maternal Aunt    Colon polyps Neg Hx     SOCIAL HISTORY: Social History   Tobacco Use   Smoking status: Never    Passive exposure: Never   Smokeless tobacco: Never  Vaping Use   Vaping status: Never Used  Substance Use Topics   Alcohol use: Not Currently   Drug use: No   Social History   Social History Narrative   Not on file     OBJECTIVE: PHYSICAL EXAM: There were no vitals taken for this visit.  General:*** General appearance: Awake and alert. No distress. Cooperative with exam.  Skin: No obvious rash or jaundice. HEENT: Atraumatic. Anicteric. Lungs: Non-labored breathing on room air  Heart: Regular Abdomen: Soft, non tender. Extremities: No edema. No obvious deformity.  Musculoskeletal: No obvious joint swelling. Psych: Affect appropriate.  Neurological: Mental Status: Alert. Speech fluent. No pseudobulbar affect Cranial Nerves: CNII: No RAPD. Visual fields grossly intact. CNIII, IV, VI: PERRL. No nystagmus. EOMI. CN V: Facial sensation intact bilaterally to fine touch. Masseter clench strong. Jaw jerk***. CN VII: Facial muscles symmetric and strong. No ptosis at rest or after sustained upgaze***. CN VIII: Hearing grossly intact bilaterally. CN IX: No hypophonia. CN X: Palate elevates symmetrically. CN XI: Full strength shoulder shrug bilaterally. CN XII: Tongue protrusion full and midline. No atrophy or  fasciculations. No significant dysarthria*** Motor: Tone is ***. *** fasciculations in *** extremities. *** atrophy. No grip or percussive myotonia.***  Individual muscle group testing (MRC grade out of 5):  Movement     Neck flexion ***    Neck extension ***     Right Left   Shoulder abduction *** ***   Shoulder adduction *** ***   Shoulder ext rotation *** ***   Shoulder int rotation *** ***   Elbow flexion *** ***   Elbow extension *** ***   Wrist extension *** ***   Wrist flexion *** ***   Finger abduction - FDI *** ***   Finger abduction - ADM *** ***   Finger extension *** ***   Finger distal flexion - 2/3 *** ***   Finger distal flexion - 4/5 *** ***   Thumb flexion - FPL *** ***   Thumb abduction - APB *** ***    Hip flexion *** ***   Hip extension *** ***   Hip adduction *** ***   Hip abduction *** ***   Knee extension *** ***   Knee  flexion *** ***   Dorsiflexion *** ***   Plantarflexion *** ***   Inversion *** ***   Eversion *** ***   Great toe extension *** ***   Great toe flexion *** ***     Reflexes:  Right Left   Bicep *** ***   Tricep *** ***   BrRad *** ***   Knee *** ***   Ankle *** ***    Pathological Reflexes: Babinski: *** response bilaterally*** Hoffman: *** Troemner: *** Pectoral: *** Palmomental: *** Facial: *** Midline tap: *** Sensation: Pinprick: *** Vibration: *** Temperature: *** Proprioception: *** Coordination: Intact finger-to- nose-finger bilaterally. Romberg negative.*** Gait: Able to rise from chair with arms crossed unassisted. Normal, narrow-based gait. Able to tandem walk. Able to walk on toes and heels.***  Lab and Test Review: Internal labs: 08/30/23: BMP unremarkable CBC unremarkable  AChR abs (binding, blocking, modulating): negative  Lumbar puncture (12/21/20): Opening pressure 20 cm water RBC 500, WBC 1, protein 57, glucose 78 Gram stain negative  B12 (12/16/20): 153 Folate (12/16/20): low at  3.2 B1 wnl  External labs: CMP (06/19/23) significant for alk phos 193, AST 71, ALT 37  Imaging/Procedures: MRI cervical spine wo contrast (12/20/20): Normal MRI of the cervical spine.   MRI brain wo contrast (12/20/20): Normal brain MRI.   MRI brain w/wo contrast (04/21/21): IMPRESSION: No evidence of recent infarction, hemorrhage, or mass. No abnormal enhancement.   Prominence of the palatine tonsils.  MRI cervical spine w/wo contrast (04/24/21): Normal examination   MRI left knee (09/20/23): IMPRESSION: Lateral subluxation of the patella. No contusion or definitive retinacular injury. Correlate for instability.   Fluid surrounding the superior and inferior surface of the lateral meniscus consistent with avulsion/floating meniscus.   Small tear to the posterior horn of the medial meniscus involving the undersurface.   Moderate joint effusion. ***  ASSESSMENT: TASHONNA DESCOTEAUX is a 33 y.o. female who presents for evaluation of ***. *** has a relevant medical history of ***. *** neurological examination is pertinent for ***. Available diagnostic data is significant for ***. This constellation of symptoms and objective data would most likely localize to ***. ***  PLAN: -Blood work: *** ***  -Return to clinic ***  The impression above as well as the plan as outlined below were extensively discussed with the patient (in the company of ***) who voiced understanding. All questions were answered to their satisfaction.  The patient was counseled on pertinent fall precautions per the printed material provided today, and as noted under the Patient Instructions section below.***  When available, results of the above investigations and possible further recommendations will be communicated to the patient via telephone/MyChart. Patient to call office if not contacted after expected testing turnaround time.   Total time spent reviewing records, interview, history/exam,  documentation, and coordination of care on day of encounter:  *** min   Thank you for allowing me to participate in patient's care.  If I can answer any additional questions, I would be pleased to do so.  Venetia Potters, MD   CC: Andi Jointer, DO [address] [place] [address]  CC: Referring provider: Genelle Standing, MD [address] [place],  [place] [address]

## 2023-12-21 ENCOUNTER — Emergency Department (HOSPITAL_COMMUNITY)
Admission: EM | Admit: 2023-12-21 | Discharge: 2023-12-22 | Disposition: A | Discharge status: Home / Self Care | Attending: Emergency Medicine | Admitting: Emergency Medicine

## 2023-12-21 ENCOUNTER — Encounter (HOSPITAL_COMMUNITY): Payer: Self-pay

## 2023-12-21 ENCOUNTER — Other Ambulatory Visit: Payer: Self-pay

## 2023-12-21 ENCOUNTER — Emergency Department (HOSPITAL_COMMUNITY)

## 2023-12-21 DIAGNOSIS — M25562 Pain in left knee: Secondary | ICD-10-CM | POA: Diagnosis not present

## 2023-12-21 DIAGNOSIS — Y92193 Bedroom in other specified residential institution as the place of occurrence of the external cause: Secondary | ICD-10-CM | POA: Diagnosis not present

## 2023-12-21 DIAGNOSIS — M25512 Pain in left shoulder: Secondary | ICD-10-CM | POA: Diagnosis not present

## 2023-12-21 DIAGNOSIS — M25511 Pain in right shoulder: Secondary | ICD-10-CM | POA: Insufficient documentation

## 2023-12-21 DIAGNOSIS — W06XXXA Fall from bed, initial encounter: Secondary | ICD-10-CM | POA: Diagnosis not present

## 2023-12-21 DIAGNOSIS — M25561 Pain in right knee: Secondary | ICD-10-CM | POA: Diagnosis not present

## 2023-12-21 MED ORDER — HYDROCODONE-ACETAMINOPHEN 5-325 MG PO TABS
1.0000 | ORAL_TABLET | Freq: Once | ORAL | Status: AC
  Administered 2023-12-21: 1 via ORAL
  Filled 2023-12-21: qty 1

## 2023-12-21 NOTE — ED Notes (Signed)
 Received in report pt is waiting for a convo back to [facility]. All paperwork is in the room per AJ.

## 2023-12-21 NOTE — ED Provider Notes (Cosign Needed Addendum)
 [place] EMERGENCY DEPARTMENT AT [facility] Provider Note   CSN: [id number] Arrival date & time: 12/21/23  1244     Patient presents with: Carla Little is a 33 y.o. female.  Here from rehab facility for fall from bed at her rehab facility.  She is awaiting surgery for left knee.  She had dislocation 2022 and recurrent dislocation 2025 and has a chronic ACL tear.  He states today she was laying in bed and rolled over and fell from the bed.  Denies loss of consciousness states she did hit her head but has no headache, she is on blood thinners, no numbness tingling or weakness outside of her usual from baseline deficits from prior episode of Guillain-Barr  syndrome.  She states both of her shoulders and her knees hurt, no change in her baseline range of motion.  No neck pain, no back pain.    Fall       Prior to Admission medications   Medication Sig Start Date End Date Taking? Authorizing Provider  acetaminophen  (TYLENOL ) 325 MG tablet Take 2 tablets (650 mg total) by mouth every 6 (six) hours as needed for mild pain (or Fever >/= 101). 01/26/21   Jens Durand, MD  ascorbic acid  (VITAMIN C) 500 MG tablet Take 500 mg by mouth daily.    [provider]  buPROPion  (WELLBUTRIN  SR) 150 MG 12 hr tablet Take 150 mg by mouth daily.    [provider]  busPIRone  (BUSPAR ) 5 MG tablet Take 1 tablet (5 mg total) by mouth 2 (two) times daily. 01/26/21   Jens Durand, MD  cholecalciferol  (VITAMIN D ) 25 MCG tablet Take 2 tablets (2,000 Units total) by mouth daily. Patient taking differently: Take 1,000 Units by mouth daily. 11/28/20   Leotis Bogus, MD  cyanocobalamin  (,VITAMIN B-12,) 1000 MCG/ML injection Inject 1 mL (1,000 mcg total) into the muscle every 30 (thirty) days. 01/29/21   Jens Durand, MD  folic acid  (FOLVITE ) 1 MG tablet Take 1 tablet (1 mg total) by mouth daily. 01/27/21   Jens Durand, MD  hydrOXYzine  (ATARAX ) 25 MG tablet Take 1 tablet  (25 mg total) by mouth every 8 (eight) hours as needed for anxiety. 04/28/21   Ricky Fines, MD  lansoprazole (PREVACID) 30 MG capsule Take 30 mg by mouth daily at 12 noon.    [provider]  lidocaine  (LIDODERM ) 5 % Place 1 patch onto the skin daily. Remove & Discard patch within 12 hours or as directed by MD 10/21/23   Suellen Cantor A, PA-C  linaclotide  (LINZESS ) 290 MCG CAPS capsule Take 1 capsule (290 mcg total) by mouth daily before breakfast. 04/18/21   Antoinette Doe, MD  methocarbamol  (ROBAXIN ) 500 MG tablet Take 1 tablet (500 mg total) by mouth every 6 (six) hours as needed for muscle spasms. 11/28/20   Leotis Bogus, MD  mirtazapine  (REMERON  SOL-TAB) 15 MG disintegrating tablet Take 15 mg by mouth at bedtime.    [provider]  MOLNUPIRAVIR PO Take 800 mg by mouth 2 (two) times daily.    [provider]  Multiple Vitamin (MULTIVITAMIN) capsule Take 1 capsule by mouth daily.    [provider]  naloxone [facility]) nasal spray 4 mg/0.1 mL Place 1 spray into the nose once.    [provider]  Nystatin  (GERHARDT'S BUTT CREAM) CREA Apply 1 application topically as needed for irritation. 04/17/21   Antoinette Doe, MD  ondansetron  (ZOFRAN -ODT) 4 MG disintegrating tablet Take 1 tablet (  4 mg total) by mouth every 8 (eight) hours as needed for nausea or vomiting. 04/28/21   Ricky Fines, MD  oxyCODONE  (OXY IR/ROXICODONE ) 5 MG immediate release tablet Take 1 tablet (5 mg total) by mouth every 12 (twelve) hours as needed for severe pain (pain score 7-10). 08/31/23   [name], MD  pantoprazole  (PROTONIX ) 40 MG tablet Take 1 tablet (40 mg total) by mouth 2 (two) times daily. 04/28/21   Ricky Fines, MD  polyethylene glycol (MIRALAX  / GLYCOLAX ) 17 g packet Take 17 g by mouth 2 (two) times daily. 04/17/21   Antoinette Doe, MD  predniSONE  (DELTASONE ) 10 MG tablet Take 1 tablet (10 mg total) by mouth daily with breakfast. 04/29/21   Ricky Fines, MD   pregabalin  (LYRICA ) 75 MG capsule Take 1 capsule (75 mg total) by mouth 3 (three) times daily. 01/26/21   Jens Durand, MD  Semaglutide-Weight Management (WEGOVY) 0.5 MG/0.5ML SOAJ Inject 0.5 mg into the skin every 7 (seven) days.    [provider]  senna-docusate (SENOKOT-S) 8.6-50 MG tablet Take 1 tablet by mouth at bedtime as needed for mild constipation. 01/26/21   Jens Durand, MD  venlafaxine (EFFEXOR) 37.5 MG tablet Take 37.5 mg by mouth daily.    [provider]  vitamin B-12 (CYANOCOBALAMIN ) 500 MCG tablet Take 1 tablet (500 mcg total) by mouth daily. 04/29/21   Ricky Fines, MD    Allergies: Azithromycin     Review of Systems  Updated Vital Signs BP 112/60   Pulse 93   Temp 98.1 F (36.7 C) (Oral)   Resp 17   Ht 5' 4 (1.626 m)   Wt (!) 191.9 kg   LMP 12/14/2023 (Approximate)   SpO2 99%   BMI 72.61 kg/m   Physical Exam Vitals and nursing note reviewed.  Constitutional:      General: She is not in acute distress.    Appearance: She is well-developed.  HENT:     Head: Normocephalic and atraumatic.  Eyes:     Conjunctiva/sclera: Conjunctivae normal.  Cardiovascular:     Rate and Rhythm: Normal rate and regular rhythm.     Heart sounds: No murmur heard. Pulmonary:     Effort: Pulmonary effort is normal. No respiratory distress.     Breath sounds: Normal breath sounds.  Abdominal:     Palpations: Abdomen is soft.     Tenderness: There is no abdominal tenderness.  Musculoskeletal:        General: No swelling.     Cervical back: Normal range of motion and neck supple. No tenderness.     Comments: Mild tenderness bilateral anterior knees, DP and PT pulses intact bilaterally, mild tenderness to shoulders bilaterally diffusely with normal range of motion.  Distal pulses intact, no tenderness of elbow or wrist.  Skin:    General: Skin is warm and dry.     Capillary Refill: Capillary refill takes less than 2 seconds.  Neurological:     Mental  Status: She is alert.  Psychiatric:        Mood and Affect: Mood normal.     (all labs ordered are listed, but only abnormal results are displayed) Labs Reviewed - No data to display  EKG: None  Radiology: DG Shoulder Right Result Date: 12/21/2023 CLINICAL DATA:  Shoulder pain and knee pain. EXAM: RIGHT SHOULDER - 2+ VIEW COMPARISON:  None Available. FINDINGS: There is no evidence of an acute fracture or dislocation. There is no evidence of arthropathy or other focal bone  abnormality. Soft tissues are unremarkable. IMPRESSION: Negative. Electronically Signed   By: Suzen Dials M.D.   On: 12/21/2023 14:56   DG Shoulder Left Result Date: 12/21/2023 CLINICAL DATA:  Shoulder pain and knee pain. EXAM: LEFT SHOULDER - 2+ VIEW COMPARISON:  None Available. FINDINGS: There is no evidence of fracture or dislocation. There is no evidence of arthropathy or other focal bone abnormality. Soft tissues are unremarkable. IMPRESSION: Negative. Electronically Signed   By: Suzen Dials M.D.   On: 12/21/2023 14:51   DG Knee Complete 4 Views Left Result Date: 12/21/2023 CLINICAL DATA:  Shoulder pain and knee pain. EXAM: LEFT KNEE - COMPLETE 4+ VIEW COMPARISON:  October 21, 2023 FINDINGS: No evidence of an acute fracture. A small chronic deformity is seen involving the lateral tip of the lateral tibial plateau. There is mild, stable lateral subluxation of the left patella and left tibia. No evidence of arthropathy or other focal bone abnormality. A small joint effusion is seen. IMPRESSION: 1. Small joint effusion. 2. Mild, stable lateral subluxation of the left patella and left tibia. Electronically Signed   By: Suzen Dials M.D.   On: 12/21/2023 14:49   DG Knee Complete 4 Views Right Result Date: 12/21/2023 CLINICAL DATA:  Shoulder pain and knee pain. EXAM: RIGHT KNEE - COMPLETE 4+ VIEW COMPARISON:  October 21, 2023 FINDINGS: The study is limited secondary to the patient's body habitus. No evidence of an  acute fracture or dislocation. No evidence of arthropathy or other focal bone abnormality. Soft tissues are unremarkable. IMPRESSION: No acute osseous abnormality. Electronically Signed   By: Suzen Dials M.D.   On: 12/21/2023 14:46     Procedures   Medications Ordered in the ED  HYDROcodone -acetaminophen  (NORCO/VICODIN) 5-325 MG per tablet 1 tablet (1 tablet Oral Given 12/21/23 1509)                                    Medical Decision Making Differential diagnosis includes but not limited to fracture, sprain, strain, contusion, dislocation, concussion, other  ED course: Patient had a fall from bed today and states she has pain in both of her shoulders and hips.  She reports that the rehab insisted she be evaluated, though she states she feels okay.  She states she does not have a headache, did not have loss of consciousness.  No neck pain, no sensory changes.  She has some mild musculoskeletal tenderness without change in her baseline range of motion.  Left knee at baseline is unstable and has some residual dislocation.  X-ray ordered of bilateral knees and shoulders and showed no acute findings.  Exam was somewhat limited due to her body habitus, but is overall neurovascularly intact.  Advised patient on outpatient follow-up with orthopedics as scheduled for her repair of her left ACL and she was given strict return precautions.  She was given Norco in the ER for pain and is feeling better.  Amount and/or Complexity of Data Reviewed Radiology: ordered and independent interpretation performed.    Details: X-ray left shoulder, no fracture or dislocation X-ray right shoulder, no fracture or dislocation,  x-ray left knee shows chronic lateral luxation of left patella and tibia X-ray right knee shows no fracture dislocation  Agree with radiology readings.  Risk Prescription drug management.        Final diagnoses:  Acute pain of both knees    ED Discharge Orders  None           Suellen Sherran LABOR, PA-C 12/21/23 [address] PA-C 12/21/23 1604    Franklyn Sid SAILOR, MD 12/22/23 [phone]

## 2023-12-21 NOTE — Discharge Instructions (Signed)
 It was a pleasure taking care of you today.  You were seen for pain in your shoulders and knees on both sides after a fall from bed today.  Fortunately her x-rays were all normal.  Follow-up with your orthopedic doctor for your chronic knee dislocation as scheduled.  Come back to the ER for new or worsening symptoms.

## 2023-12-21 NOTE — ED Triage Notes (Signed)
 Pt c/o shoulder pain, and knee pain; knee dislocated from previous fall. Vitals WDL. Denies N/V/D, headache pain 5/10. A&Ox4.

## 2023-12-22 NOTE — ED Notes (Signed)
 EMS bedside to transport back to facility

## 2024-01-01 ENCOUNTER — Ambulatory Visit: Payer: Medicare (Managed Care) | Admitting: Neurology

## 2024-01-06 ENCOUNTER — Encounter (HOSPITAL_BASED_OUTPATIENT_CLINIC_OR_DEPARTMENT_OTHER): Payer: Self-pay | Admitting: Orthopaedic Surgery

## 2024-01-07 NOTE — Telephone Encounter (Signed)
 I spoke with the patient and advised her she will come to the hospital the day of surgery and staying over night in the hospital after surgery.

## 2024-01-13 ENCOUNTER — Encounter (HOSPITAL_COMMUNITY): Payer: Self-pay | Admitting: Orthopaedic Surgery

## 2024-01-13 ENCOUNTER — Other Ambulatory Visit: Payer: Self-pay

## 2024-01-13 NOTE — Progress Notes (Signed)
 PCP - Andi Jointer, DO  Cardiologist -    PPM/ICD - denies Device Orders - n/a Rep Notified - n/a  Chest x-ray - at facility [facility] (per nurse at facility chest x ray was normal) EKG - at facility ( per nurse unable to send a copy of it) Stress Test - denies ECHO - denies Cardiac Cath - denies  CPAP - denies  GLP-1 -(WEGOVY) Per patient she is no longer taking  DM -denies  Blood Thinner Instructions: denies Aspirin Instructions: denies  ERAS Protcol - clear liquids until 9:15 am.  COVID TEST- n/a  Anesthesia review: Yes, recent fall in September at facility, BMI 72.62. Per patient recently has bronchitis (chest xray done at facility and antibiotics completed on 01-07-24) Patient denied any further cough or congestion, while speaking with patient no congestion, wheezing or cough noted.  Patient verbally denies any shortness of breath, fever, cough and chest pain during phone call   -------------  SDW INSTRUCTIONS given:  Your procedure is scheduled on January 14, 2024.  Report to [facility] Main Entrance A at 9:45 A.M., and check in at the Admitting office.  Call this number if you have problems the morning of surgery:  [phone]   Remember:  Do not eat after midnight the night before your surgery  You may drink clear liquids until 9:15 the morning of your surgery.   Clear liquids allowed are: Water, Non-Citrus Juices (without pulp), Carbonated Beverages, Clear Tea, Black Coffee Only, and Gatorade    Take these medicines the morning of surgery with A SIP OF WATER  acetaminophen  (TYLENOL )  buPROPion  (WELLBUTRIN  SR)  busPIRone  (BUSPAR )  hydrOXYzine  (ATARAX )  methocarbamol  (ROBAXIN )  naloxone (NARCAN)  ondansetron  (ZOFRAN -ODT)  oxyCODONE  (OXY IR/ROXICODONE )  pantoprazole  (PROTONIX )  predniSONE  (DELTASONE )  pregabalin  (LYRICA )  venlafaxine (EFFEXOR)    As of today, STOP taking any Aspirin (unless otherwise instructed by your surgeon)  Aleve, Naproxen, Ibuprofen , Motrin , Advil , Goody's, BC's, all herbal medications, fish oil, and all vitamins.                      Do not wear jewelry, make up, or nail polish            Do not wear lotions, powders, perfumes/colognes, or deodorant.            Do not shave 48 hours prior to surgery.  Men may shave face and neck.            Do not bring valuables to the hospital.            [facility] is not responsible for any belongings or valuables.  Do NOT Smoke (Tobacco/Vaping) 24 hours prior to your procedure If you use a CPAP at night, you may bring all equipment for your overnight stay.   Contacts, glasses, dentures or bridgework may not be worn into surgery.      For patients admitted to the hospital, discharge time will be determined by your treatment team.   Patients discharged the day of surgery will not be allowed to drive home, and someone needs to stay with them for 24 hours.    Special instructions:   [facility]- Preparing For Surgery  Before surgery, you can play an important role. Because skin is not sterile, your skin needs to be as free of germs as possible. You can reduce the number of germs on your skin by washing with CHG (chlorahexidine gluconate) Soap before surgery.  CHG is  an antiseptic cleaner which kills germs and bonds with the skin to continue killing germs even after washing.    Oral Hygiene is also important to reduce your risk of infection.  Remember - BRUSH YOUR TEETH THE MORNING OF SURGERY WITH YOUR REGULAR TOOTHPASTE  Please do not use if you have an allergy to CHG or antibacterial soaps. If your skin becomes reddened/irritated stop using the CHG.  Do not shave (including legs and underarms) for at least 48 hours prior to first CHG shower. It is OK to shave your face.  Please follow these instructions carefully.   Shower the NIGHT BEFORE SURGERY and the MORNING OF SURGERY with DIAL Soap.   Pat yourself dry with a CLEAN TOWEL.  Wear CLEAN  PAJAMAS to bed the night before surgery  Place CLEAN SHEETS on your bed the night of your first shower and DO NOT SLEEP WITH PETS.   Day of Surgery: Please shower morning of surgery  Wear Clean/Comfortable clothing the morning of surgery Do not apply any deodorants/lotions.   Remember to brush your teeth WITH YOUR REGULAR TOOTHPASTE.   Questions were answered. Patient verbalized understanding of instructions.

## 2024-01-13 NOTE — Progress Notes (Signed)
 -------------  SDW INSTRUCTIONS given:   Your procedure is scheduled on January 14, 2024.             Report to [facility] Main Entrance A at 9:45 A.M., and check in at the Admitting office.             Call this number if you have problems the morning of surgery:             [phone]               Remember:             Do not eat after midnight the night before your surgery   You may drink clear liquids until 9:15 the morning of your surgery.   Clear liquids allowed are: Water, Non-Citrus Juices (without pulp), Carbonated Beverages, Clear Tea, Black Coffee Only, and Gatorade                          Take these medicines the morning of surgery with A SIP OF WATER  acetaminophen  (TYLENOL )  buPROPion  (WELLBUTRIN  SR)  busPIRone  (BUSPAR )  hydrOXYzine  (ATARAX )  methocarbamol  (ROBAXIN )  naloxone (NARCAN)  ondansetron  (ZOFRAN -ODT)  oxyCODONE  (OXY IR/ROXICODONE )  pantoprazole  (PROTONIX )  predniSONE  (DELTASONE )  pregabalin  (LYRICA )  venlafaxine (EFFEXOR)      As of today, STOP taking any Aspirin (unless otherwise instructed by your surgeon) Aleve, Naproxen, Ibuprofen , Motrin , Advil , Goody's, BC's, all herbal medications, fish oil, and all vitamins.                       Do not wear jewelry, make up, or nail polish            Do not wear lotions, powders, perfumes/colognes, or deodorant.            Do not shave 48 hours prior to surgery.  Men may shave face and neck.            Do not bring valuables to the hospital.            [facility] is not responsible for any belongings or valuables.   Do NOT Smoke (Tobacco/Vaping) 24 hours prior to your procedure If you use a CPAP at night, you may bring all equipment for your overnight stay.   Contacts, glasses, dentures or bridgework may not be worn into surgery.      For patients admitted to the hospital, discharge time will be determined by your treatment team.   Patients discharged the day of surgery will not be allowed to  drive home, and someone needs to stay with them for 24 hours.       Special instructions:   [place]- Preparing For Surgery   Before surgery, you can play an important role. Because skin is not sterile, your skin needs to be as free of germs as possible. You can reduce the number of germs on your skin by washing with CHG (chlorahexidine gluconate) Soap before surgery.  CHG is an antiseptic cleaner which kills germs and bonds with the skin to continue killing germs even after washing.     Oral Hygiene is also important to reduce your risk of infection.  Remember - BRUSH YOUR TEETH THE MORNING OF SURGERY WITH YOUR REGULAR TOOTHPASTE   Please do not use if you have an allergy to CHG or antibacterial soaps. If your skin becomes reddened/irritated stop using the CHG.  Do not shave (  including legs and underarms) for at least 48 hours prior to first CHG shower. It is OK to shave your face.   Please follow these instructions carefully.              Shower the NIGHT BEFORE SURGERY and the MORNING OF SURGERY with DIAL Soap.    Pat yourself dry with a CLEAN TOWEL.   Wear CLEAN PAJAMAS to bed the night before surgery   Place CLEAN SHEETS on your bed the night of your first shower and DO NOT SLEEP WITH PETS.     Day of Surgery: Please shower morning of surgery  Wear Clean/Comfortable clothing the morning of surgery Do not apply any deodorants/lotions.   Remember to brush your teeth WITH YOUR REGULAR TOOTHPASTE.   Questions were answered. Patient verbalized understanding of instructions.

## 2024-01-14 ENCOUNTER — Ambulatory Visit (HOSPITAL_COMMUNITY)

## 2024-01-14 ENCOUNTER — Encounter (HOSPITAL_COMMUNITY)
Admission: RE | Disposition: A | Discharge status: Skilled Nursing Facility | Payer: Self-pay | Source: Home / Self Care | Attending: Orthopaedic Surgery

## 2024-01-14 ENCOUNTER — Ambulatory Visit (HOSPITAL_COMMUNITY): Payer: Self-pay | Admitting: Physician Assistant

## 2024-01-14 ENCOUNTER — Inpatient Hospital Stay (HOSPITAL_COMMUNITY): Payer: Self-pay | Admitting: Physician Assistant

## 2024-01-14 ENCOUNTER — Inpatient Hospital Stay (HOSPITAL_COMMUNITY)
Admission: RE | Admit: 2024-01-14 | Discharge: 2024-01-16 | DRG: 501 | Disposition: A | Discharge status: Skilled Nursing Facility | Attending: Orthopaedic Surgery | Admitting: Orthopaedic Surgery

## 2024-01-14 ENCOUNTER — Encounter (HOSPITAL_COMMUNITY): Payer: Self-pay | Admitting: Orthopaedic Surgery

## 2024-01-14 ENCOUNTER — Other Ambulatory Visit: Payer: Self-pay

## 2024-01-14 DIAGNOSIS — D62 Acute posthemorrhagic anemia: Secondary | ICD-10-CM | POA: Diagnosis not present

## 2024-01-14 DIAGNOSIS — Z7952 Long term (current) use of systemic steroids: Secondary | ICD-10-CM | POA: Diagnosis not present

## 2024-01-14 DIAGNOSIS — M2352 Chronic instability of knee, left knee: Secondary | ICD-10-CM | POA: Diagnosis present

## 2024-01-14 DIAGNOSIS — F32A Depression, unspecified: Secondary | ICD-10-CM | POA: Diagnosis present

## 2024-01-14 DIAGNOSIS — Z8249 Family history of ischemic heart disease and other diseases of the circulatory system: Secondary | ICD-10-CM | POA: Diagnosis not present

## 2024-01-14 DIAGNOSIS — M23612 Other spontaneous disruption of anterior cruciate ligament of left knee: Secondary | ICD-10-CM

## 2024-01-14 DIAGNOSIS — K7581 Nonalcoholic steatohepatitis (NASH): Secondary | ICD-10-CM | POA: Diagnosis present

## 2024-01-14 DIAGNOSIS — Z79899 Other long term (current) drug therapy: Secondary | ICD-10-CM

## 2024-01-14 DIAGNOSIS — E66813 Obesity, class 3: Secondary | ICD-10-CM | POA: Diagnosis present

## 2024-01-14 DIAGNOSIS — M25362 Other instability, left knee: Secondary | ICD-10-CM

## 2024-01-14 DIAGNOSIS — G610 Guillain-Barre syndrome: Secondary | ICD-10-CM | POA: Diagnosis present

## 2024-01-14 DIAGNOSIS — S83522A Sprain of posterior cruciate ligament of left knee, initial encounter: Secondary | ICD-10-CM

## 2024-01-14 DIAGNOSIS — Z881 Allergy status to other antibiotic agents status: Secondary | ICD-10-CM

## 2024-01-14 DIAGNOSIS — I5021 Acute systolic (congestive) heart failure: Secondary | ICD-10-CM | POA: Diagnosis not present

## 2024-01-14 DIAGNOSIS — R000 Tachycardia, unspecified: Secondary | ICD-10-CM | POA: Diagnosis present

## 2024-01-14 DIAGNOSIS — Z8711 Personal history of peptic ulcer disease: Secondary | ICD-10-CM

## 2024-01-14 DIAGNOSIS — F419 Anxiety disorder, unspecified: Secondary | ICD-10-CM | POA: Diagnosis present

## 2024-01-14 DIAGNOSIS — J9601 Acute respiratory failure with hypoxia: Secondary | ICD-10-CM | POA: Diagnosis not present

## 2024-01-14 DIAGNOSIS — Z6841 Body Mass Index (BMI) 40.0 and over, adult: Secondary | ICD-10-CM

## 2024-01-14 DIAGNOSIS — S83512A Sprain of anterior cruciate ligament of left knee, initial encounter: Secondary | ICD-10-CM | POA: Diagnosis present

## 2024-01-14 DIAGNOSIS — Z800 Family history of malignant neoplasm of digestive organs: Secondary | ICD-10-CM

## 2024-01-14 DIAGNOSIS — S8992XA Unspecified injury of left lower leg, initial encounter: Secondary | ICD-10-CM

## 2024-01-14 DIAGNOSIS — S83242A Other tear of medial meniscus, current injury, left knee, initial encounter: Secondary | ICD-10-CM

## 2024-01-14 DIAGNOSIS — S83519A Sprain of anterior cruciate ligament of unspecified knee, initial encounter: Principal | ICD-10-CM | POA: Diagnosis present

## 2024-01-14 HISTORY — PX: MEDIAL PATELLOFEMORAL LIGAMENT REPAIR: SHX2020

## 2024-01-14 HISTORY — DX: Anxiety disorder, unspecified: F41.9

## 2024-01-14 LAB — CBC
HCT: 45.1 % (ref 36.0–46.0)
Hemoglobin: 13.9 g/dL (ref 12.0–15.0)
MCH: 27.7 pg (ref 26.0–34.0)
MCHC: 30.8 g/dL (ref 30.0–36.0)
MCV: 89.8 fL (ref 80.0–100.0)
Platelets: 372 K/uL (ref 150–400)
RBC: 5.02 MIL/uL (ref 3.87–5.11)
RDW: 17.5 % — ABNORMAL HIGH (ref 11.5–15.5)
WBC: 12.1 K/uL — ABNORMAL HIGH (ref 4.0–10.5)
nRBC: 0 % (ref 0.0–0.2)

## 2024-01-14 LAB — COMPREHENSIVE METABOLIC PANEL WITH GFR
ALT: 40 U/L (ref 0–44)
AST: 57 U/L — ABNORMAL HIGH (ref 15–41)
Albumin: 3.3 g/dL — ABNORMAL LOW (ref 3.5–5.0)
Alkaline Phosphatase: 143 U/L — ABNORMAL HIGH (ref 38–126)
Anion gap: 12 (ref 5–15)
BUN: 8 mg/dL (ref 6–20)
CO2: 23 mmol/L (ref 22–32)
Calcium: 8.5 mg/dL — ABNORMAL LOW (ref 8.9–10.3)
Chloride: 103 mmol/L (ref 98–111)
Creatinine, Ser: 0.73 mg/dL (ref 0.44–1.00)
GFR, Estimated: >60 mL/min (ref >60)
Glucose, Bld: 100 mg/dL — ABNORMAL HIGH (ref 70–99)
Potassium: 3.9 mmol/L (ref 3.5–5.1)
Sodium: 138 mmol/L (ref 135–145)
Total Bilirubin: 0.9 mg/dL (ref 0.0–1.2)
Total Protein: 7.2 g/dL (ref 6.5–8.1)

## 2024-01-14 LAB — HCG, SERUM, QUALITATIVE: Preg, Serum: NEGATIVE

## 2024-01-14 SURGERY — KNEE ARTHROSCOPY WITH ANTERIOR CRUCIATE LIGAMENT (ACL) RECONSTRUCTION WITH HAMSTRING GRAFT
Anesthesia: General | Site: Knee | Laterality: Left

## 2024-01-14 MED ORDER — HYDROMORPHONE HCL 1 MG/ML IJ SOLN
0.5000 mg | INTRAMUSCULAR | Status: DC | PRN
  Administered 2024-01-14 – 2024-01-15 (×2): 1 mg via INTRAVENOUS
  Filled 2024-01-14 (×2): qty 1

## 2024-01-14 MED ORDER — FENTANYL CITRATE (PF) 100 MCG/2ML IJ SOLN
25.0000 ug | INTRAMUSCULAR | Status: DC | PRN
  Administered 2024-01-14: 50 ug via INTRAVENOUS
  Administered 2024-01-14 (×2): 25 ug via INTRAVENOUS

## 2024-01-14 MED ORDER — PREGABALIN 25 MG PO CAPS
25.0000 mg | ORAL_CAPSULE | Freq: Three times a day (TID) | ORAL | Status: DC
  Administered 2024-01-14 – 2024-01-16 (×5): 25 mg via ORAL
  Filled 2024-01-14 (×5): qty 1

## 2024-01-14 MED ORDER — CHLORHEXIDINE GLUCONATE 0.12 % MT SOLN
15.0000 mL | Freq: Once | OROMUCOSAL | Status: AC
  Administered 2024-01-14: 15 mL via OROMUCOSAL
  Filled 2024-01-14: qty 15

## 2024-01-14 MED ORDER — PANTOPRAZOLE SODIUM 40 MG PO TBEC
40.0000 mg | DELAYED_RELEASE_TABLET | Freq: Every day | ORAL | Status: DC
  Administered 2024-01-15 – 2024-01-16 (×2): 40 mg via ORAL
  Filled 2024-01-14: qty 1

## 2024-01-14 MED ORDER — ADULT MULTIVITAMIN W/MINERALS CH
1.0000 | ORAL_TABLET | Freq: Every day | ORAL | Status: DC
  Administered 2024-01-14 – 2024-01-16 (×3): 1 via ORAL
  Filled 2024-01-14 (×3): qty 1

## 2024-01-14 MED ORDER — VITAMIN C 500 MG PO TABS
500.0000 mg | ORAL_TABLET | Freq: Every day | ORAL | Status: DC
Start: 2024-01-14 — End: 2024-01-16
  Administered 2024-01-14 – 2024-01-16 (×3): 500 mg via ORAL
  Filled 2024-01-14 (×3): qty 1

## 2024-01-14 MED ORDER — FENTANYL CITRATE (PF) 100 MCG/2ML IJ SOLN
INTRAMUSCULAR | Status: AC
  Filled 2024-01-14: qty 2

## 2024-01-14 MED ORDER — ONDANSETRON HCL 4 MG/2ML IJ SOLN: 4.0000 mg | Freq: Once | INTRAMUSCULAR | Status: DC | PRN

## 2024-01-14 MED ORDER — ACETAMINOPHEN 500 MG PO TABS
1000.0000 mg | ORAL_TABLET | Freq: Four times a day (QID) | ORAL | Status: AC
  Administered 2024-01-15 (×3): 1000 mg via ORAL
  Filled 2024-01-14 (×3): qty 2

## 2024-01-14 MED ORDER — LOPERAMIDE HCL 2 MG PO CAPS: 2.0000 mg | ORAL_CAPSULE | Freq: Four times a day (QID) | ORAL | Status: DC | PRN

## 2024-01-14 MED ORDER — FENTANYL CITRATE (PF) 100 MCG/2ML IJ SOLN: 100.0000 ug | Freq: Once | INTRAMUSCULAR | Status: AC

## 2024-01-14 MED ORDER — ROPIVACAINE HCL 5 MG/ML IJ SOLN
INTRAMUSCULAR | Status: DC | PRN
  Administered 2024-01-14: 20 mL via PERINEURAL

## 2024-01-14 MED ORDER — LIDOCAINE 2% (20 MG/ML) 5 ML SYRINGE
INTRAMUSCULAR | Status: DC | PRN
  Administered 2024-01-14: 100 mg via INTRAVENOUS

## 2024-01-14 MED ORDER — OXYCODONE HCL 10 MG PO TABS
10.0000 mg | ORAL_TABLET | Freq: Four times a day (QID) | ORAL | Status: DC | PRN
Start: 2024-01-14 — End: 2024-01-14

## 2024-01-14 MED ORDER — HYDROMORPHONE HCL 1 MG/ML IJ SOLN
INTRAMUSCULAR | Status: DC | PRN
  Administered 2024-01-14: .5 mg via INTRAVENOUS

## 2024-01-14 MED ORDER — MELATONIN 5 MG PO TABS
10.0000 mg | ORAL_TABLET | Freq: Every day | ORAL | Status: DC
  Administered 2024-01-14 – 2024-01-15 (×2): 10 mg via ORAL
  Filled 2024-01-14 (×3): qty 2

## 2024-01-14 MED ORDER — TRANEXAMIC ACID-NACL 1000-0.7 MG/100ML-% IV SOLN
1000.0000 mg | INTRAVENOUS | Status: AC
  Administered 2024-01-14: 1000 mg via INTRAVENOUS
  Filled 2024-01-14: qty 100

## 2024-01-14 MED ORDER — GLYCOPYRROLATE PF 0.2 MG/ML IJ SOSY
PREFILLED_SYRINGE | INTRAMUSCULAR | Status: AC
  Filled 2024-01-14: qty 1

## 2024-01-14 MED ORDER — ORAL CARE MOUTH RINSE: 15.0000 mL | Freq: Once | OROMUCOSAL | Status: AC

## 2024-01-14 MED ORDER — VANCOMYCIN HCL 1000 MG IV SOLR
INTRAVENOUS | Status: AC
  Filled 2024-01-14: qty 20

## 2024-01-14 MED ORDER — PHENYLEPHRINE 80 MCG/ML (10ML) SYRINGE FOR IV PUSH (FOR BLOOD PRESSURE SUPPORT)
PREFILLED_SYRINGE | INTRAVENOUS | Status: AC
  Filled 2024-01-14: qty 10

## 2024-01-14 MED ORDER — PROPOFOL 10 MG/ML IV BOLUS
INTRAVENOUS | Status: DC | PRN
  Administered 2024-01-14 (×3): 40 mg via INTRAVENOUS
  Administered 2024-01-14: 200 mg via INTRAVENOUS
  Administered 2024-01-14: 40 mg via INTRAVENOUS

## 2024-01-14 MED ORDER — MIDAZOLAM HCL 2 MG/2ML IJ SOLN
INTRAMUSCULAR | Status: AC
  Filled 2024-01-14: qty 2

## 2024-01-14 MED ORDER — ACETAMINOPHEN 500 MG PO TABS
1000.0000 mg | ORAL_TABLET | Freq: Once | ORAL | Status: AC
  Administered 2024-01-14: 1000 mg via ORAL
  Filled 2024-01-14: qty 2

## 2024-01-14 MED ORDER — OXYCODONE HCL 5 MG PO TABS
ORAL_TABLET | ORAL | Status: AC
  Filled 2024-01-14: qty 1

## 2024-01-14 MED ORDER — POLYETHYLENE GLYCOL 3350 17 G PO PACK: 17.0000 g | PACK | Freq: Two times a day (BID) | ORAL | Status: DC | PRN

## 2024-01-14 MED ORDER — ROCURONIUM BROMIDE 10 MG/ML (PF) SYRINGE
PREFILLED_SYRINGE | INTRAVENOUS | Status: AC
Start: 2024-01-14 — End: 2024-01-14
  Filled 2024-01-14: qty 10

## 2024-01-14 MED ORDER — ACETAMINOPHEN 325 MG PO TABS: 650.0000 mg | ORAL_TABLET | Freq: Four times a day (QID) | ORAL | Status: DC | PRN

## 2024-01-14 MED ORDER — DEXAMETHASONE SOD PHOSPHATE PF 10 MG/ML IJ SOLN
INTRAMUSCULAR | Status: DC | PRN
  Administered 2024-01-14: 5 mg via INTRAVENOUS

## 2024-01-14 MED ORDER — DEXMEDETOMIDINE HCL IN NACL 80 MCG/20ML IV SOLN
INTRAVENOUS | Status: AC
  Filled 2024-01-14: qty 20

## 2024-01-14 MED ORDER — MULTIVITAMINS PO CAPS: 1.0000 | ORAL_CAPSULE | Freq: Every day | ORAL | Status: DC

## 2024-01-14 MED ORDER — PROPOFOL 10 MG/ML IV BOLUS
INTRAVENOUS | Status: AC
  Filled 2024-01-14: qty 20

## 2024-01-14 MED ORDER — GABAPENTIN 300 MG PO CAPS
300.0000 mg | ORAL_CAPSULE | Freq: Once | ORAL | Status: AC
  Administered 2024-01-14: 300 mg via ORAL
  Filled 2024-01-14: qty 1

## 2024-01-14 MED ORDER — LIDOCAINE 2% (20 MG/ML) 5 ML SYRINGE
INTRAMUSCULAR | Status: AC
  Filled 2024-01-14: qty 5

## 2024-01-14 MED ORDER — SODIUM CHLORIDE 0.9 % IV SOLN: INTRAVENOUS | Status: DC

## 2024-01-14 MED ORDER — BUSPIRONE HCL 10 MG PO TABS
20.0000 mg | ORAL_TABLET | Freq: Three times a day (TID) | ORAL | Status: DC
  Administered 2024-01-14 – 2024-01-16 (×5): 20 mg via ORAL
  Filled 2024-01-14 (×5): qty 2

## 2024-01-14 MED ORDER — FOLIC ACID 1 MG PO TABS
1.0000 mg | ORAL_TABLET | Freq: Every day | ORAL | Status: DC
Start: 2024-01-14 — End: 2024-01-16
  Administered 2024-01-14 – 2024-01-16 (×3): 1 mg via ORAL
  Filled 2024-01-14 (×3): qty 1

## 2024-01-14 MED ORDER — OXYCODONE HCL 5 MG PO TABS
10.0000 mg | ORAL_TABLET | ORAL | Status: DC | PRN
  Administered 2024-01-15 – 2024-01-16 (×8): 15 mg via ORAL
  Filled 2024-01-14 (×8): qty 3

## 2024-01-14 MED ORDER — EPINEPHRINE PF 1 MG/ML IJ SOLN
INTRAMUSCULAR | Status: AC
  Filled 2024-01-14: qty 1

## 2024-01-14 MED ORDER — ONDANSETRON HCL 4 MG/2ML IJ SOLN
INTRAMUSCULAR | Status: DC | PRN
  Administered 2024-01-14: 4 mg via INTRAVENOUS

## 2024-01-14 MED ORDER — ROCURONIUM BROMIDE 10 MG/ML (PF) SYRINGE
PREFILLED_SYRINGE | INTRAVENOUS | Status: DC | PRN
  Administered 2024-01-14: 10 mg via INTRAVENOUS
  Administered 2024-01-14: 100 mg via INTRAVENOUS
  Administered 2024-01-14 (×3): 10 mg via INTRAVENOUS

## 2024-01-14 MED ORDER — LACTATED RINGERS IV SOLN
INTRAVENOUS | Status: DC
Start: 2024-01-14 — End: 2024-01-14

## 2024-01-14 MED ORDER — CYANOCOBALAMIN 1000 MCG/ML IJ SOLN: 1000.0000 ug | INTRAMUSCULAR | Status: DC

## 2024-01-14 MED ORDER — SENNOSIDES-DOCUSATE SODIUM 8.6-50 MG PO TABS
1.0000 | ORAL_TABLET | Freq: Every evening | ORAL | Status: DC | PRN
Start: 2024-01-14 — End: 2024-01-16

## 2024-01-14 MED ORDER — VENLAFAXINE HCL ER 75 MG PO CP24
225.0000 mg | ORAL_CAPSULE | Freq: Every day | ORAL | Status: DC
  Administered 2024-01-15 – 2024-01-16 (×2): 225 mg via ORAL
  Filled 2024-01-14 (×2): qty 3

## 2024-01-14 MED ORDER — DEXMEDETOMIDINE HCL IN NACL 80 MCG/20ML IV SOLN
INTRAVENOUS | Status: DC | PRN
  Administered 2024-01-14 (×2): 8 ug via INTRAVENOUS
  Administered 2024-01-14: 4 ug via INTRAVENOUS

## 2024-01-14 MED ORDER — PHENYLEPHRINE 80 MCG/ML (10ML) SYRINGE FOR IV PUSH (FOR BLOOD PRESSURE SUPPORT)
PREFILLED_SYRINGE | INTRAVENOUS | Status: DC | PRN
  Administered 2024-01-14 (×5): 80 ug via INTRAVENOUS

## 2024-01-14 MED ORDER — METHOCARBAMOL 500 MG PO TABS
500.0000 mg | ORAL_TABLET | Freq: Four times a day (QID) | ORAL | Status: DC | PRN
  Administered 2024-01-15 – 2024-01-16 (×4): 500 mg via ORAL
  Filled 2024-01-14 (×4): qty 1

## 2024-01-14 MED ORDER — FENTANYL CITRATE (PF) 250 MCG/5ML IJ SOLN
INTRAMUSCULAR | Status: DC | PRN
  Administered 2024-01-14: 25 ug via INTRAVENOUS
  Administered 2024-01-14: 50 ug via INTRAVENOUS
  Administered 2024-01-14: 25 ug via INTRAVENOUS

## 2024-01-14 MED ORDER — NALOXONE HCL 4 MG/0.1ML NA LIQD: 1.0000 | Freq: Once | NASAL | Status: DC

## 2024-01-14 MED ORDER — DOXEPIN HCL 25 MG PO CAPS
25.0000 mg | ORAL_CAPSULE | Freq: Every day | ORAL | Status: DC
  Administered 2024-01-14 – 2024-01-15 (×2): 25 mg via ORAL
  Filled 2024-01-14 (×3): qty 1

## 2024-01-14 MED ORDER — KETAMINE HCL 50 MG/5ML IJ SOSY
PREFILLED_SYRINGE | INTRAMUSCULAR | Status: AC
  Filled 2024-01-14: qty 5

## 2024-01-14 MED ORDER — SUGAMMADEX SODIUM 200 MG/2ML IV SOLN
INTRAVENOUS | Status: DC | PRN
  Administered 2024-01-14: 400 mg via INTRAVENOUS

## 2024-01-14 MED ORDER — FENTANYL CITRATE (PF) 100 MCG/2ML IJ SOLN
INTRAMUSCULAR | Status: AC
  Administered 2024-01-14: 100 ug via INTRAVENOUS
  Filled 2024-01-14: qty 2

## 2024-01-14 MED ORDER — BUPROPION HCL ER (XL) 150 MG PO TB24
300.0000 mg | ORAL_TABLET | Freq: Every day | ORAL | Status: DC
  Administered 2024-01-15 – 2024-01-16 (×2): 300 mg via ORAL
  Filled 2024-01-14 (×2): qty 2

## 2024-01-14 MED ORDER — HYDROXYZINE HCL 25 MG PO TABS
25.0000 mg | ORAL_TABLET | Freq: Every day | ORAL | Status: DC
  Administered 2024-01-14 – 2024-01-15 (×2): 25 mg via ORAL
  Filled 2024-01-14 (×2): qty 1

## 2024-01-14 MED ORDER — OXYCODONE HCL 5 MG PO TABS: 5.0000 mg | ORAL_TABLET | ORAL | Status: DC | PRN

## 2024-01-14 MED ORDER — PROPOFOL 10 MG/ML IV BOLUS
INTRAVENOUS | Status: AC
Start: 2024-01-14 — End: 2024-01-14
  Filled 2024-01-14: qty 20

## 2024-01-14 MED ORDER — ONDANSETRON HCL 4 MG/2ML IJ SOLN: 4.0000 mg | Freq: Four times a day (QID) | INTRAMUSCULAR | Status: DC | PRN

## 2024-01-14 MED ORDER — HYDROMORPHONE HCL 1 MG/ML IJ SOLN
INTRAMUSCULAR | Status: AC
  Filled 2024-01-14: qty 0.5

## 2024-01-14 MED ORDER — OXYCODONE HCL 5 MG PO TABS
5.0000 mg | ORAL_TABLET | Freq: Once | ORAL | Status: AC | PRN
  Administered 2024-01-14: 5 mg via ORAL

## 2024-01-14 MED ORDER — CEFAZOLIN SODIUM-DEXTROSE 3-4 GM/150ML-% IV SOLN
3.0000 g | INTRAVENOUS | Status: AC
  Administered 2024-01-14: 3 g via INTRAVENOUS
  Filled 2024-01-14: qty 150

## 2024-01-14 MED ORDER — ACETAMINOPHEN 10 MG/ML IV SOLN
1000.0000 mg | Freq: Once | INTRAVENOUS | Status: DC | PRN
  Administered 2024-01-14: 1000 mg via INTRAVENOUS

## 2024-01-14 MED ORDER — ACETAMINOPHEN 325 MG PO TABS: 325.0000 mg | ORAL_TABLET | Freq: Four times a day (QID) | ORAL | Status: DC | PRN

## 2024-01-14 MED ORDER — ONDANSETRON HCL 4 MG PO TABS
4.0000 mg | ORAL_TABLET | Freq: Four times a day (QID) | ORAL | Status: DC | PRN
  Administered 2024-01-16: 4 mg via ORAL
  Filled 2024-01-14: qty 1

## 2024-01-14 MED ORDER — PHENYLEPHRINE HCL-NACL 20-0.9 MG/250ML-% IV SOLN
INTRAVENOUS | Status: DC | PRN
  Administered 2024-01-14: 20 ug/min via INTRAVENOUS

## 2024-01-14 MED ORDER — LINACLOTIDE 145 MCG PO CAPS
290.0000 ug | ORAL_CAPSULE | Freq: Every day | ORAL | Status: DC
  Administered 2024-01-15 – 2024-01-16 (×2): 290 ug via ORAL
  Filled 2024-01-14 (×2): qty 2

## 2024-01-14 MED ORDER — ONDANSETRON HCL 4 MG/2ML IJ SOLN
INTRAMUSCULAR | Status: AC
Start: 2024-01-14 — End: 2024-01-14
  Filled 2024-01-14: qty 2

## 2024-01-14 MED ORDER — BUPIVACAINE HCL (PF) 0.25 % IJ SOLN
INTRAMUSCULAR | Status: AC
  Filled 2024-01-14: qty 30

## 2024-01-14 MED ORDER — BUPIVACAINE-EPINEPHRINE (PF) 0.25% -1:200000 IJ SOLN
INTRAMUSCULAR | Status: AC
  Filled 2024-01-14: qty 30

## 2024-01-14 MED ORDER — DOCUSATE SODIUM 100 MG PO CAPS
100.0000 mg | ORAL_CAPSULE | Freq: Two times a day (BID) | ORAL | Status: DC
  Administered 2024-01-14 – 2024-01-16 (×4): 100 mg via ORAL
  Filled 2024-01-14 (×4): qty 1

## 2024-01-14 MED ORDER — PREGABALIN 100 MG PO CAPS
100.0000 mg | ORAL_CAPSULE | Freq: Three times a day (TID) | ORAL | Status: DC
  Administered 2024-01-14 – 2024-01-16 (×5): 100 mg via ORAL
  Filled 2024-01-14 (×5): qty 1

## 2024-01-14 MED ORDER — ACETAMINOPHEN 10 MG/ML IV SOLN
INTRAVENOUS | Status: AC
  Filled 2024-01-14: qty 100

## 2024-01-14 MED ORDER — OXYCODONE HCL 5 MG/5ML PO SOLN: 5.0000 mg | Freq: Once | ORAL | Status: AC | PRN

## 2024-01-14 MED ORDER — VITAMIN D 25 MCG (1000 UNIT) PO TABS
2000.0000 [IU] | ORAL_TABLET | Freq: Every day | ORAL | Status: DC
  Administered 2024-01-14 – 2024-01-16 (×3): 2000 [IU] via ORAL
  Filled 2024-01-14 (×3): qty 2

## 2024-01-14 MED ORDER — ENOXAPARIN SODIUM 40 MG/0.4ML IJ SOSY
40.0000 mg | PREFILLED_SYRINGE | INTRAMUSCULAR | Status: DC
  Administered 2024-01-15 – 2024-01-16 (×2): 40 mg via SUBCUTANEOUS
  Filled 2024-01-14 (×2): qty 0.4

## 2024-01-14 SURGICAL SUPPLY — 93 items
ALCOHOL 70% 16 OZ (MISCELLANEOUS) ×1 IMPLANT
ANCH JUGGERLOOP OC 2.9 MB SUT (Anchor) IMPLANT
ANCHOR JUGGERKNOT SOFT 2.9 (Anchor) IMPLANT
ANCHOR SUT QUATTRO KNTLS 4.5 (Anchor) IMPLANT
BAG COUNTER SPONGE SURGICOUNT (BAG) IMPLANT
BANDAGE ESMARK 6X9 LF (GAUZE/BANDAGES/DRESSINGS) IMPLANT
BLADE EXCALIBUR 4.0X13 (MISCELLANEOUS) ×1 IMPLANT
BLADE SURG 10 STRL SS (BLADE) IMPLANT
BLADE SURG 15 STRL LF DISP TIS (BLADE) ×2 IMPLANT
BLK #2 STIFF BB ×1 IMPLANT
BNDG ELASTIC 6X15 VLCR STRL LF (GAUZE/BANDAGES/DRESSINGS) ×1 IMPLANT
CHLORAPREP W/TINT 26 (MISCELLANEOUS) ×2 IMPLANT
COOLER ICEMAN CLASSIC (MISCELLANEOUS) ×1 IMPLANT
COVER MAYO STAND STRL (DRAPES) ×1 IMPLANT
COVER SURGICAL LIGHT HANDLE (MISCELLANEOUS) ×1 IMPLANT
CUFF TOURN SGL QUICK 42 (TOURNIQUET CUFF) IMPLANT
CUFF TRNQT CYL 34X4.125X (TOURNIQUET CUFF) IMPLANT
DRAPE ARTHROSCOPY W/POUCH 114 (DRAPES) ×1 IMPLANT
DRAPE INCISE IOBAN 66X45 STRL (DRAPES) ×1 IMPLANT
DRAPE OEC MINIVIEW 54X84 (DRAPES) IMPLANT
DRAPE SURG ORHT 6 SPLT 77X108 (DRAPES) ×1 IMPLANT
DRAPE U-SHAPE 47X51 STRL (DRAPES) ×1 IMPLANT
DRSG TEGADERM 4X4.75 (GAUZE/BANDAGES/DRESSINGS) ×4 IMPLANT
DRSG TELFA 3X8 NADH STRL (GAUZE/BANDAGES/DRESSINGS) ×2 IMPLANT
DW OUTFLOW CASSETTE/TUBE SET (MISCELLANEOUS) IMPLANT
ELECTRODE REM PT RTRN 9FT ADLT (ELECTROSURGICAL) ×1 IMPLANT
EXCALIBUR 3.8MM X 13CM (MISCELLANEOUS) ×1 IMPLANT
GAUZE PAD ABD 8X10 STRL (GAUZE/BANDAGES/DRESSINGS) ×1 IMPLANT
GAUZE SPONGE 4X4 12PLY STRL LF (GAUZE/BANDAGES/DRESSINGS) ×1 IMPLANT
GAUZE XEROFORM 1X8 LF (GAUZE/BANDAGES/DRESSINGS) ×2 IMPLANT
GLOVE BIOGEL PI IND STRL 6.5 (GLOVE) ×1 IMPLANT
GLOVE BIOGEL PI IND STRL 8 (GLOVE) ×1 IMPLANT
GLOVE ECLIPSE 6.0 STRL STRAW (GLOVE) ×1 IMPLANT
GLOVE INDICATOR 8.0 STRL GRN (GLOVE) ×1 IMPLANT
GOWN STRL REUS W/ TWL LRG LVL3 (GOWN DISPOSABLE) ×2 IMPLANT
GRAFT TISS 230-320 GRACILIS (Bone Implant) IMPLANT
GRAFT TISS ANT TIB TNDN (Tissue) IMPLANT
GRAFT TISS QUADRICEP TEND 9-11 (Tissue) IMPLANT
IMMOBILIZER KNEE 22 40 CIR (ORTHOPEDIC SUPPLIES) IMPLANT
IMMOBILIZER KNEE 22 UNIV (SOFTGOODS) ×1 IMPLANT
IMPL CLIP ON BTTN IDEAL LG 20 (Orthopedic Implant) IMPLANT
IMPL CLIP ON BTTN IDEAL MED 16 (Orthopedic Implant) IMPLANT
IMPL IDEAL ZIPLOOP NO BTN (Button) IMPLANT
IMPL ZIPLOOP IDEAL BTN 15/60 (Orthopedic Implant) IMPLANT
IMPL ZIPLOOP IDEAL NO BTN (Button) ×1 IMPLANT
IMPL ZIPLOOP IDEAL NO BTN (Orthopedic Implant) IMPLANT
KIT BASIN OR (CUSTOM PROCEDURE TRAY) ×1 IMPLANT
KIT JUGGERKNOT DISP 2.9MM (KITS) IMPLANT
KIT REAMER SWITCHCUT 4.5X10 (ORTHOPEDIC DISPOSABLE SUPPLIES) IMPLANT
KIT TOGGLELOC ANK 14 DISP (ORTHOPEDIC DISPOSABLE SUPPLIES) IMPLANT
KIT TURNOVER KIT B (KITS) ×1 IMPLANT
MANIFOLD NEPTUNE II (INSTRUMENTS) ×1 IMPLANT
NDL 18GX1X1/2 (RX/OR ONLY) (NEEDLE) ×1 IMPLANT
NDL HYPO 18GX1.5 BLUNT FILL (NEEDLE) ×1 IMPLANT
NEEDLE 18GX1X1/2 (RX/OR ONLY) (NEEDLE) ×1 IMPLANT
NEEDLE HYPO 18GX1.5 BLUNT FILL (NEEDLE) ×1 IMPLANT
PACK ARTHROSCOPY DSU (CUSTOM PROCEDURE TRAY) ×1 IMPLANT
PAD ARMBOARD POSITIONER FOAM (MISCELLANEOUS) ×2 IMPLANT
PAD CAST 4YDX4 CTTN HI CHSV (CAST SUPPLIES) ×1 IMPLANT
PAD COLD SHLDR WRAP-ON (PAD) ×1 IMPLANT
PADDING CAST COTTON 6X4 STRL (CAST SUPPLIES) ×2 IMPLANT
PENCIL BUTTON HOLSTER BLD 10FT (ELECTRODE) ×1 IMPLANT
PROBE APOLLO 90XL (SURGICAL WAND) IMPLANT
SOLN 0.9% NACL POUR BTL 1000ML (IV SOLUTION) ×1 IMPLANT
SPIKE FLUID TRANSFER (MISCELLANEOUS) ×1 IMPLANT
SPONGE T-LAP 18X18 ~~LOC~~+RFID (SPONGE) ×1 IMPLANT
SPONGE T-LAP 4X18 ~~LOC~~+RFID (SPONGE) ×2 IMPLANT
STRIP CLOSURE SKIN 1/2X4 (GAUZE/BANDAGES/DRESSINGS) ×2 IMPLANT
SUCTION TUBE FRAZIER 10FR DISP (SUCTIONS) ×1 IMPLANT
SUT ACTIVBRAID 2 1/2 TP BL NDL (SUTURE) IMPLANT
SUT ETHILON 3 0 PS 1 (SUTURE) ×2 IMPLANT
SUT MAXBRAID STIFF BLK/BLU 2 (SUTURE) IMPLANT
SUT MNCRL AB 3-0 PS2 18 (SUTURE) ×1 IMPLANT
SUT TAPE ACTIVBRAID 1.5 BL (SUTURE) IMPLANT
SUT VIC AB 0 CT1 27XBRD ANBCTR (SUTURE) ×1 IMPLANT
SUT VIC AB 2-0 CT1 TAPERPNT 27 (SUTURE) ×1 IMPLANT
SUT VICRYL 0 UR6 27IN ABS (SUTURE) ×1 IMPLANT
SUTURE BB 1.5X25 CL #2 NDL (SUTURE) IMPLANT
SUTURE BB 1.5X25 CL #2 NEEDLE (SUTURE) ×1 IMPLANT
SUTURE BROADBAND MINI LP BLACK (SUTURE) IMPLANT
SUTURE MAXBRAID #2 BLACK BLUE (SUTURE) IMPLANT
SUTURE MAXBRAID STIFF BB 2 BLK (SUTURE) IMPLANT
SYR 30ML LL (SYRINGE) ×1 IMPLANT
SYR 3ML LL SCALE MARK (SYRINGE) ×1 IMPLANT
SYR BULB IRRIG 60ML STRL (SYRINGE) ×1 IMPLANT
SYR TB 1ML LUER SLIP (SYRINGE) ×1 IMPLANT
TOWEL GREEN STERILE (TOWEL DISPOSABLE) ×1 IMPLANT
TOWEL GREEN STERILE FF (TOWEL DISPOSABLE) ×1 IMPLANT
TUBING ARTHROSCOPY IRRIG 16FT (MISCELLANEOUS) ×1 IMPLANT
UNDERPAD 30X36 HEAVY ABSORB (UNDERPADS AND DIAPERS) ×1 IMPLANT
WAND ABLATOR APOLLO I90 (BUR) IMPLANT
WRAP KNEE MAXI GEL POST OP (GAUZE/BANDAGES/DRESSINGS) ×1 IMPLANT
YANKAUER SUCT BULB TIP NO VENT (SUCTIONS) ×1 IMPLANT

## 2024-01-14 NOTE — Transfer of Care (Signed)
 Immediate Anesthesia Transfer of Care Note  Patient: Carla Little  Procedure(s) Performed: KNEE ARTHROSCOPY WITH ANTERIOR  AND POSTERIOR CRUCIATE LIGAMENT (ACL) RECONSTRUCTION WITH ALLOGRAFTS (Left: Knee) RECONSTRUCTION, LIGAMENT, MEDIAL PATELLOFEMORAL (Left: Knee)  Patient Location: PACU  General and reg block   Level of Consciousness: awake; a&o x3  Airway & Oxygen Therapy: Patient Spontanous Breathing and Patient connected to face mask oxygen  Post-op Assessment: Report given to RN and Post -op Vital signs reviewed and stable  Post vital signs: Reviewed and stable  Last Vitals:  Vitals Value Taken Time  BP 131/109 01/14/24 17:45  Temp    Pulse 103 01/14/24 17:49  Resp 15 01/14/24 17:49  SpO2 89 % 01/14/24 17:49  Vitals shown include unfiled device data.  Last Pain:  Vitals:   01/14/24 1204  TempSrc:   PainSc: 0-No pain      Patients Stated Pain Goal: 0 (01/14/24 1204)  Complications: No notable events documented.

## 2024-01-14 NOTE — H&P (Signed)
 Chief Complaint: Left knee instability        History of Present Illness:    10/09/2023: Presents today for follow-up of her left knee.  Unfortunately she has had a residual dislocation of the left knee.  She is placed in a knee immobilizer and is here for further discussion   Carla Little is a 33 y.o. female presents as a referral from a partner Dr. Vernetta for ongoing left knee issues.  She does not have any pain in the left knee but does occasionally feel like giving out.  She is status post left knee dislocation in 2022 which was subsequently closed reduced and treated with Dr. Vernetta.  She has subsequently developed Guillain-Barr syndrome and as result has had to essentially relearn how to walk.  She has recently obtained a stability brace for this left knee.  She states that she is now walking with a walker but as she has gotten more active she does occasionally feel like the knee gives out.  She is not having pain in the knee.       Surgical History:   None   PMH/PSH/Family History/Social History/Meds/Allergies:         Past Medical History:  Diagnosis Date   Class 3 obesity 12/09/2020   Depression     GERD (gastroesophageal reflux disease)     Guillain Barr syndrome (HCC)     Nonalcoholic steatohepatitis (NASH) 12/09/2020   Obesity     PUD (peptic ulcer disease) 12/09/2020             Past Surgical History:  Procedure Laterality Date   BIOPSY   11/19/2020    Procedure: BIOPSY;  Surgeon: Kristie Lamprey, MD;  Location: [facility];  Service: Endoscopy;;   ESOPHAGOGASTRODUODENOSCOPY N/A 01/10/2021    normal   ESOPHAGOGASTRODUODENOSCOPY (EGD) WITH PROPOFOL  N/A 11/19/2020    LA Grade B reflux esophagitis, one small non-bleeding gastric ulcer in antrum s/p biopsy. Negative h.pylori.   FLEXIBLE SIGMOIDOSCOPY N/A 01/10/2021    Colonoscopy attempted Oct 2022 by Dr. Elicia but prep was poor. Stool in rectum and rectosigmoid colon.         Social History         Socioeconomic History   Marital status: Single      Spouse name: Not on file   Number of children: Not on file   Years of education: Not on file   Highest education level: Not on file  Occupational History   Not on file  Tobacco Use   Smoking status: Never   Smokeless tobacco: Never  Vaping Use   Vaping status: Never Used  Substance and Sexual Activity   Alcohol use: Not Currently   Drug use: No   Sexual activity: Not on file  Other Topics Concern   Not on file  Social History Narrative   Not on file    Social Drivers of Health    Financial Resource Strain: Not on file  Food Insecurity: Not on file  Transportation Needs: Not on file  Physical Activity: Not on file  Stress: Not on file  Social Connections: Not on file         Family History  Problem Relation Age of Onset   Hypertension Other     Colon cancer Maternal Aunt     Colon polyps Neg Hx          Allergies      Allergies  Allergen Reactions   Azithromycin  Shortness Of  Breath and Nausea And Vomiting            Current Outpatient Medications  Medication Sig Dispense Refill   acetaminophen  (TYLENOL ) 325 MG tablet Take 2 tablets (650 mg total) by mouth every 6 (six) hours as needed for mild pain (or Fever >/= 101).       ascorbic acid  (VITAMIN C) 500 MG tablet Take 500 mg by mouth daily.       buPROPion  (WELLBUTRIN  SR) 150 MG 12 hr tablet Take 150 mg by mouth daily.       busPIRone  (BUSPAR ) 5 MG tablet Take 1 tablet (5 mg total) by mouth 2 (two) times daily.       cholecalciferol  (VITAMIN D ) 25 MCG tablet Take 2 tablets (2,000 Units total) by mouth daily. (Patient taking differently: Take 1,000 Units by mouth daily.) 30 tablet 1   cyanocobalamin  (,VITAMIN B-12,) 1000 MCG/ML injection Inject 1 mL (1,000 mcg total) into the muscle every 30 (thirty) days. 1 mL 0   folic acid  (FOLVITE ) 1 MG tablet Take 1 tablet (1 mg total) by mouth daily.       hydrOXYzine  (ATARAX ) 25 MG tablet Take  1 tablet (25 mg total) by mouth every 8 (eight) hours as needed for anxiety. 20 tablet 0   lansoprazole (PREVACID) 30 MG capsule Take 30 mg by mouth daily at 12 noon.       linaclotide  (LINZESS ) 290 MCG CAPS capsule Take 1 capsule (290 mcg total) by mouth daily before breakfast. 30 capsule     methocarbamol  (ROBAXIN ) 500 MG tablet Take 1 tablet (500 mg total) by mouth every 6 (six) hours as needed for muscle spasms. 20 tablet 0   mirtazapine  (REMERON  SOL-TAB) 15 MG disintegrating tablet Take 15 mg by mouth at bedtime.       MOLNUPIRAVIR PO Take 800 mg by mouth 2 (two) times daily.       Multiple Vitamin (MULTIVITAMIN) capsule Take 1 capsule by mouth daily.       naloxone (NARCAN) nasal spray 4 mg/0.1 mL Place 1 spray into the nose once.       Nystatin  (GERHARDT'S BUTT CREAM) CREA Apply 1 application topically as needed for irritation.       ondansetron  (ZOFRAN -ODT) 4 MG disintegrating tablet Take 1 tablet (4 mg total) by mouth every 8 (eight) hours as needed for nausea or vomiting. 30 tablet 0   oxyCODONE  (OXY IR/ROXICODONE ) 5 MG immediate release tablet Take 1 tablet (5 mg total) by mouth every 12 (twelve) hours as needed for severe pain (pain score 7-10). 20 tablet 0   pantoprazole  (PROTONIX ) 40 MG tablet Take 1 tablet (40 mg total) by mouth 2 (two) times daily. 60 tablet 2   polyethylene glycol (MIRALAX  / GLYCOLAX ) 17 g packet Take 17 g by mouth 2 (two) times daily. 14 each 0   predniSONE  (DELTASONE ) 10 MG tablet Take 1 tablet (10 mg total) by mouth daily with breakfast.       pregabalin  (LYRICA ) 75 MG capsule Take 1 capsule (75 mg total) by mouth 3 (three) times daily. 60 capsule 0   Semaglutide-Weight Management (WEGOVY) 0.5 MG/0.5ML SOAJ Inject 0.5 mg into the skin every 7 (seven) days.       senna-docusate (SENOKOT-S) 8.6-50 MG tablet Take 1 tablet by mouth at bedtime as needed for mild constipation.       venlafaxine (EFFEXOR) 37.5 MG tablet Take 37.5 mg by mouth daily.       vitamin B-12  (CYANOCOBALAMIN ) 500  MCG tablet Take 1 tablet (500 mcg total) by mouth daily. 30 tablet 2      No current facility-administered medications for this visit.      Imaging Results (Last 48 hours)  No results found.     Review of Systems:   A ROS was performed including pertinent positives and negatives as documented in the HPI.   Physical Exam :   Constitutional: NAD and appears stated age Neurological: Alert and oriented Psych: Appropriate affect and cooperative There were no vitals taken for this visit.    Comprehensive Musculoskeletal Exam:           Musculoskeletal Exam  Gait Normal  Alignment Normal    Right Left  Inspection Normal Normal  Palpation      Tenderness None None  Crepitus None None  Effusion None None  Range of Motion      Extension -3 -3  Flexion 135 135  Strength      Extension 5/5 5/5  Flexion 5/5 5/5  Ligament Exam        Generalized Laxity No No  Lachman Negative Positive  Pivot Shift Negative Negative  Anterior Drawer Negative Positive  Valgus at 0 Negative Negative  Valgus at 20 Negative Negative  Varus at 0 0 0  Varus at 20   0 0  Posterior Drawer at 90 0 0  Vascular/Lymphatic Exam      Edema None None  Venous Stasis Changes No No  Distal Circulation Normal Normal  Neurologic      Light Touch Sensation Intact Intact  Special Tests: Positive anterior drawer, laxity with varus        Imaging:   Xray (4 views left knee): No evidence of residual instability   MRI (left knee): Limited by motion artifact although there is complete tear of the ACL as well as posterior lateral corner with a proximal avulsion of the PCL, lateral patella subluxation   I personally reviewed and interpreted the radiographs.     Assessment:   33 y.o. female with multi ligamentous knee injury after a knee dislocation 2 years prior.  Unfortunately she has now had a recurrence of her dislocation.  At this time I did discuss that unfortunately she is unlikely  to regain quality of life with a highly unstable knee.  At this time she has clicking and popping with instability with any weight on the knee.  Given this discuss treatment options.  Overall I did discuss that this is quite a difficult problem with her weight.  That being said I do believe she would be a candidate for knee reconstruction given the fact that she is still so limited.  I did discuss that I would specifically recommend an anterior cruciate ligament allograft with a posterior lateral corner repair as well as an MPFL reconstruction with repair.  I did discuss the risks and limitations.  I did discuss that I would recommend overnight observation should be pursue this.  After discussion of all of this she would like to proceed Plan :     - Plan for left knee arthroscopy with anterior cruciate ligament reconstruction with quadriceps allograft, posterior lateral corner repair, MPFL reconstruction with repair       After a lengthy discussion of treatment options, including risks, benefits, alternatives, complications of surgical and nonsurgical conservative options, the patient elected surgical repair.    The patient  is aware of the material risks  and complications including, but not limited to injury to adjacent  structures, neurovascular injury, infection, numbness, bleeding, implant failure, thermal burns, stiffness, persistent pain, failure to heal, disease transmission from allograft, need for further surgery, dislocation, anesthetic risks, blood clots, risks of death,and others. The probabilities of surgical success and failure discussed with patient given their particular co-morbidities.The time and nature of expected rehabilitation and recovery was discussed.The patient's questions were all answered preoperatively.  No barriers to understanding were noted. I explained the natural history of the disease process and Rx rationale.  I explained to the patient what I considered to be reasonable  expectations given their personal situation.  The final treatment plan was arrived at through a shared patient decision making process model.           I personally saw and evaluated the patient, and participated in the management and treatment plan.   Elspeth Parker, MD Attending Physician, Orthopedic Surgery   This document was dictated using Dragon voice recognition software. A reasonable attempt at proof reading has been made to minimize errors.

## 2024-01-14 NOTE — Anesthesia Preprocedure Evaluation (Signed)
 Anesthesia Evaluation  Patient identified by MRN, date of birth, ID band Patient awake    Reviewed: Allergy & Precautions, NPO status , Patient's Chart, lab work & pertinent test results, reviewed documented beta blocker date and time   History of Anesthesia Complications Negative for: history of anesthetic complications  Airway Mallampati: II   Neck ROM: Limited  Mouth opening: Limited Mouth Opening  Dental no notable dental hx.    Pulmonary neg shortness of breath, pneumonia   breath sounds clear to auscultation       Cardiovascular Exercise Tolerance: Poor  Rhythm:Regular Rate:Tachycardia     Neuro/Psych neg Seizures PSYCHIATRIC DISORDERS Anxiety Depression     Neuromuscular disease    GI/Hepatic PUD,GERD  ,,(+) Hepatitis -  Endo/Other    Class 4 obesity  Renal/GU      Musculoskeletal   Abdominal   Peds  Hematology  (+) Blood dyscrasia, anemia   Anesthesia Other Findings   Reproductive/Obstetrics                              Anesthesia Physical Anesthesia Plan  ASA: 3  Anesthesia Plan: General   Post-op Pain Management:    Induction:   PONV Risk Score and Plan: 2 and Ondansetron  and Dexamethasone   Airway Management Planned: Oral ETT and Video Laryngoscope Planned  Additional Equipment:   Intra-op Plan:   Post-operative Plan: Extubation in OR  Informed Consent: I have reviewed the patients History and Physical, chart, labs and discussed the procedure including the risks, benefits and alternatives for the proposed anesthesia with the patient or authorized representative who has indicated his/her understanding and acceptance.     Dental advisory given  Plan Discussed with: CRNA  Anesthesia Plan Comments:          Anesthesia Quick Evaluation

## 2024-01-14 NOTE — Brief Op Note (Signed)
   Brief Op Note  Date of Surgery: 01/14/2024  Preoperative Diagnosis: LEFT KNEE ANTERIOR CRUCIATE LIGAMENT TEAR LEFT PATELLAR INSTABILITY  Postoperative Diagnosis: same  Procedure: Procedure(s): KNEE ARTHROSCOPY WITH ANTERIOR CRUCIATE LIGAMENT (ACL) RECONSTRUCTION WITH HAMSTRING GRAFT RECONSTRUCTION, LIGAMENT, MEDIAL PATELLOFEMORAL  Implants: Implant Name Type Inv. Item Serial No. Manufacturer Lot No. LRB No. Used Action  Ziploop, IDEAL     [id number] Left 1 Implanted  ziploop, IDEAL     [id number] Left 2 Implanted  GRAFT TISS QUADRICEP TEND 9-11 - [id number] Tissue GRAFT TISS QUADRICEP TEND 9-11 [id number] [employer]  Left 1 Implanted  GRAFT TISS ANT TIB TNDN - [id number] Tissue GRAFT TISS ANT TIB TNDN [id number] [employer]  Left 1 Implanted  broadband with rigin stitch    BIOMET ZIMMER MICROFIXATION [id number] Left 1 Implanted  ANCHOR JUGGERKNOT SOFT 2.9 - [id number] Anchor ANCHOR JUGGERKNOT SOFT 2.9  ZIMMER RECON(ORTH,TRAU,BIO,SG) [id number] Left 1 Implanted  ANCHOR SUT QUATTRO KNTLS 4.5 - [id number] Anchor ANCHOR SUT QUATTRO KNTLS 4.5  ZIMMER RECON(ORTH,TRAU,BIO,SG) [id number] Left 2 Implanted  ziplock, IDEAL     [id number] Left 1 Implanted  IDEAL clipon button    BIOMET ZIMMER MICROFIXATION [id number] Left 1 Implanted  ANCHOR SUT QUATTRO KNTLS 4.5 - [id number] Anchor ANCHOR SUT QUATTRO KNTLS 4.5  ZIMMER RECON(ORTH,TRAU,BIO,SG) [id number] Left 1 Implanted  GRAFT TISS 230-320 GRACILIS - [id number] Bone Implant GRAFT TISS 230-320 GRACILIS  [employer]  Left 1 Implanted  Maxbraid loop with rigidstitch    BIOMET ZIMMER MICROFIXATION [id number] Left 1 Implanted  IDEAL clip on button L    BIOMET ZIMMER MICROFIXATION [id number] Left 1 Implanted  ANCHOR SUT QUATTRO KNTLS 4.5 - [id number] Anchor ANCHOR SUT QUATTRO KNTLS 4.5  ZIMMER RECON(ORTH,TRAU,BIO,SG) [id number] Left 1 Implanted  ANCHOR SUT QUATTRO KNTLS 4.5 - [id number] Anchor ANCHOR SUT QUATTRO KNTLS 4.5  ZIMMER  RECON(ORTH,TRAU,BIO,SG) [id number] Left 1 Implanted  [facility] JUGGERLOOP OC 2.9 MB SUT - [id number] Anchor ANCH JUGGERLOOP OC 2.9 MB SUT  ZIMMER RECON(ORTH,TRAU,BIO,SG) [id number] Left 1 Implanted  ANCHOR SUT QUATTRO KNTLS 4.5 - [id number] Anchor ANCHOR SUT QUATTRO KNTLS 4.5  ZIMMER RECON(ORTH,TRAU,BIO,SG) [id number] Left 1 Implanted  ANCHOR SUT QUATTRO KNTLS 4.5 - [id number] Anchor ANCHOR SUT QUATTRO KNTLS 4.5  ZIMMER RECON(ORTH,TRAU,BIO,SG) [id number] Left 1 Implanted  [facility] JUGGERLOOP OC 2.9 MB SUT - [id number] Anchor ANCH JUGGERLOOP OC 2.9 MB SUT  ZIMMER RECON(ORTH,TRAU,BIO,SG) [id number] Left 2 Implanted  ANCHOR SUT QUATTRO KNTLS 4.5 - [id number] Anchor ANCHOR SUT QUATTRO KNTLS 4.5  ZIMMER RECON(ORTH,TRAU,BIO,SG) [id number] Left 1 Implanted  Broadband Loop     [id number] Left 1 Implanted    Surgeons: Surgeon(s): Genelle Standing, MD  Anesthesia: General    Estimated Blood Loss: See anesthesia record  Complications: None  Condition to PACU: Stable  Standing LITTIE Genelle, MD 01/14/2024 5:23 PM

## 2024-01-14 NOTE — Op Note (Signed)
 Date of Surgery: 01/14/2024  INDICATIONS: Carla Little is a 33 y.o.-year-old female with left multiligamentous knee injury.  The risk and benefits of the procedure were discussed in detail and documented in the pre-operative evaluation.   PREOPERATIVE DIAGNOSIS: 1.  Left complete anterior cruciate ligament rupture 2.  Left complete posterior cruciate ligament rupture 3.  Left medial meniscal tear 4.  Left knee patella instability 5.  Knee complete disruption posterior lateral corner  POSTOPERATIVE DIAGNOSIS: Same.  PROCEDURE: 1.  Knee anterior cruciate ligament reconstruction with quadriceps allograft 2.  Left knee posterior cruciate ligament reconstruction with tibialis anterior allograft 3.  Left knee medial meniscal debridement 4.  Knee medial patellofemoral ligament reconstruction with gracilis allograft 5.  Left knee posterior lateral corner repair  SURGEON: Elspeth LITTIE Parker MD  ASSISTANT: Conley Dawson, ATC  ANESTHESIA:  general  IV FLUIDS AND URINE: See anesthesia record.  ANTIBIOTICS: Ancef   ESTIMATED BLOOD LOSS: 50 mL.  IMPLANTS:  Implant Name Type Inv. Item Serial No. Manufacturer Lot No. LRB No. Used Action  Ziploop, IDEAL     [id number] Left 1 Implanted  ziploop, IDEAL     [id number] Left 2 Implanted  GRAFT TISS QUADRICEP TEND 9-11 - [id number] Tissue GRAFT TISS QUADRICEP TEND 9-11 [id number] [employer]  Left 1 Implanted  GRAFT TISS ANT TIB TNDN - [id number] Tissue GRAFT TISS ANT TIB TNDN [id number] [employer]  Left 1 Implanted  broadband with rigin stitch    BIOMET ZIMMER MICROFIXATION [id number] Left 1 Implanted  ANCHOR JUGGERKNOT SOFT 2.9 - [id number] Anchor ANCHOR JUGGERKNOT SOFT 2.9  ZIMMER RECON(ORTH,TRAU,BIO,SG) [id number] Left 1 Implanted  ANCHOR SUT QUATTRO KNTLS 4.5 - [id number] Anchor ANCHOR SUT QUATTRO KNTLS 4.5  ZIMMER RECON(ORTH,TRAU,BIO,SG) [id number] Left 2 Implanted  ziplock, IDEAL     [id number] Left 1 Implanted  IDEAL clipon button     BIOMET ZIMMER MICROFIXATION [id number] Left 1 Implanted  ANCHOR SUT QUATTRO KNTLS 4.5 - [id number] Anchor ANCHOR SUT QUATTRO KNTLS 4.5  ZIMMER RECON(ORTH,TRAU,BIO,SG) [id number] Left 1 Implanted  GRAFT TISS 230-320 GRACILIS - [id number] Bone Implant GRAFT TISS 230-320 GRACILIS  [facility]  Left 1 Implanted  Maxbraid loop with rigidstitch    BIOMET ZIMMER MICROFIXATION [id number] Left 1 Implanted  IDEAL clip on button L    BIOMET ZIMMER MICROFIXATION [id number] Left 1 Implanted  ANCHOR SUT QUATTRO KNTLS 4.5 - [id number] Anchor ANCHOR SUT QUATTRO KNTLS 4.5  ZIMMER RECON(ORTH,TRAU,BIO,SG) [id number] Left 1 Implanted  ANCHOR SUT QUATTRO KNTLS 4.5 - [id number] Anchor ANCHOR SUT QUATTRO KNTLS 4.5  ZIMMER RECON(ORTH,TRAU,BIO,SG) [id number] Left 1 Implanted  [facility] JUGGERLOOP OC 2.9 MB SUT - [id number] Anchor ANCH JUGGERLOOP OC 2.9 MB SUT  ZIMMER RECON(ORTH,TRAU,BIO,SG) [id number] Left 1 Implanted  ANCHOR SUT QUATTRO KNTLS 4.5 - [id number] Anchor ANCHOR SUT QUATTRO KNTLS 4.5  ZIMMER RECON(ORTH,TRAU,BIO,SG) [id number] Left 1 Implanted  ANCHOR SUT QUATTRO KNTLS 4.5 - [id number] Anchor ANCHOR SUT QUATTRO KNTLS 4.5  ZIMMER RECON(ORTH,TRAU,BIO,SG) [id number] Left 1 Implanted  [facility] JUGGERLOOP OC 2.9 MB SUT - [id number] Anchor ANCH JUGGERLOOP OC 2.9 MB SUT  ZIMMER RECON(ORTH,TRAU,BIO,SG) [id number] Left 2 Implanted  ANCHOR SUT QUATTRO KNTLS 4.5 - [id number] Anchor ANCHOR SUT QUATTRO KNTLS 4.5  ZIMMER RECON(ORTH,TRAU,BIO,SG) [id number] Left 1 Implanted  Broadband Loop     [id number] Left 1 Implanted    DRAINS: None  CULTURES: None  COMPLICATIONS: none  DESCRIPTION OF PROCEDURE:  Examination under anesthesia: A careful examination under anesthesia was performed.  Knee ROM motion was: -3 - 135 Lachman: 3B Pivot Shift: Positive Posterior drawer: Positive.  Varus stability in full extension: Grade 3 instability.   Varus stability in 30 degrees of flexion: 3 instability.  Valgus stability in full extension: normal.   Valgus  stability in 30 degrees of flexion: normal.  Posterolateral drawer: normal   Intra-operative findings: A thorough arthroscopic examination of the knee was performed.  The findings are: 1. Suprapatellar pouch: Normal 2. Undersurface of median ridge: Normal 3. Medial patellar facet: Normal 4. Lateral patellar facet: Normal 5. Trochlea: Normal 6. Lateral gutter/popliteus tendon: Normal 7. Hoffa's fat pad: Normal 8. Medial gutter/plica: Normal 9. ACL: Full-thickness tear 10. PCL: Thickness tear 11. Medial meniscus: Tear involving 20% anterior mid body white white rim 12. Medial compartment cartilage: Normal 13. Lateral meniscus: Normal 14. Lateral compartment cartilage: Normal  I identified the patient in the pre-operative holding area.  I marked the operative left knee with my initials. I reviewed the risks and benefits of the proposed surgical intervention and the patient (and/or patient's guardian) wished to proceed.  Anesthesia was then performed with regional block.  The patient was transferred to the operative suite and placed in the supine position with all bony prominences padded.     SCDs were placed on the non-operative lower extremity. Appropriate antibiotics was administered within 1 hour before incision. The operative extremity was then prepped and draped in standard fashion. A time out was performed confirming the correct extremity, correct patient and correct procedure.   Given the positive examination under anesthesia. The allograft ACL was thawed. After it was appropriately thawed the graft was placed on the Graft Station. The Graft Prep Attachments were placed on the base a Femoral suspension button and a suspension for fixation. The graft measured 10mm on the femoral side and 10mm on tibial side. The graft was tensioned for the next 30 minutes.  Tibialis anterior graft was prepared in a similar fashion using again suspension devices on either side of the graft.  Arthroscopy  portals were marked and portals were established with an 11 blade.  A diagnostic arthroscopy was performed with findings above including probing of meniscal and chondral surfaces.  The medial compartment was approached.  The medial meniscus was then debrided with the shaver.  This was back to a healthy margin with 80% remaining.  This time I completed the PCL reconstruction.  The tibial guide was placed over the posterior portion of the tibial plateau and direct fluoroscopy confirmed anatomic location of the PCL footprint.  The retrocutter was then drilled and a size 10 mm socket was drilled.  A suture was passed for graft passage.  At this time the femoral medial footprint was drilled using the guide.  Again a retrocutter was utilized.  Both tunnels had a length of 25 mm.  Again a suture was passed.  At this time and all inside technique was used by passing the LS anterior through the medial compartment.  This was pulled into the tibial socket.  This was subsequently tensioned and the femoral socket with excellent tensioning.  This was done in 90 degrees of flexion.  The CL graft measured 10mm on the femoral side and 10mm on tibial side.  At this time the retrograde flip cutter guide was introduced into the lateral femoral condyle at the center of the anatomic femoral ACL footprint.  This was drilled for socket depth of 20 mm at a width of 10 mm.  A fiber stick was passed and a passing suture was passed through the tunnel.  Arthroscopy confirmed intact backwall.  The tibial tunnel was created with an 10 mm low profile drill at 55 degree obliquity in the coronal plane, centered at the ACL footprint as determined by the anterior horn of the lateral meniscus through an incision in the anteromedial tibia.  Care was taken to again not converge with our previous tunnel from the lateral meniscal root repair.  The graft was then pulled into the knee with a traction suture. The ACL and tension button was confirmed  to be deep to the IT band using fluoroscopy.  The graft was then pulled into place with the white suture until parked in the femoral tunnel.     The graft was then affixed distally under tension with the knee in extension. The Button was loaded onto the loop. The white shortening strands were pulled in alternating fashion to advance the button to bone and tension the graft. The knee was then cycled and further tightened on the femoral and tibial sides. The graft was probed and found to be taut at all angles of flexion.  Lachman examination improved to 1A.  The proximal fixation/TightRope was tightened one last time to ensure maximal tightness and then excess suture removed with a knot cutter. The tails were fixed to the anterior tibia with a watcher link anchor distal to the button for secondary fixation.  This was done with the knee in 30 degrees of flexion applying a slight posterior drawer.  At this time I completed the posterior lateral corner reconstruction.  15 blade was used to incise of the anterior lateral knee.  Dissection was done down to the level of the fibula proximally and the lateral knee.  IT band was incised for visualization.  At this time the tails from the femoral fixation of the ACL were brought under the lateral ligamentous tissue and placed into the fibular head using a Quatro link anchor.  This was done with the knee held in valgus to reduce the lateral portion of the knee.  I was happy with the stability following this.   At this point, I directed my attention to medial patellofemoral ligament reconstruction.  On the back table, a 8mm semitendinosus allograft was thawed.    A 3 cm incision over the medial aspect of the patella was created and dissection was carried down to layer 1. Full thickness medial and lateral cutaneous flaps were developed.     At this point, a 15 blade was used to dissect layers 1 and 2 off the medial patella, and I then bluntly developed the interval  between layers 2 and 3.  This was taken bluntly all the way to the level of the medial epicondyle.   Next, electrocautery was used to remove soft tissue from the medial aspect of the patella from the proximal pole of the equator. A small osteophyte was encountered there, likely from a prior avulsion fracture.  This was removed with a rongeur.  The medial edge of the patella was debrided with a rongeur to create a bleeding trough of bone to accommodate the graft.  Fluoroscopy was then used to confirm the position of 2 anticipated anchors on the medial patella.  One was placed at the equator, the other halfway between the equator and the superior pole in the native footprint of the MPFL.  These were placed in standard fashion and were both loop anchors.  Excellent purchase was achieved in the patella.  The graft was then passed through these as well as 1 strand of active braid collagen  suture.   At this point, the medial epicondylar incision was localized with fluoroscopy and created with a 15 blade.  Sharp dissection was carried down to the fascia.  The fascia was then sharply incised and a Kelly clamp was placed between layers 2 and 3, from the level of the patella, and brought out the medial epicondylar incision, guaranteeing a good soft tissue tunnel for passage of the graft.   At this point, the limbs of the graft were whipstitched together.  These were then placed into the shuttles point using direct fluoroscopy using a Quatro link anchor.  The active brace sutures were tensioned with 2 quadrants of lateral patellar motion.     The wounds were closed with 0- Vicryl in the quad tendon, and and interrupted inverted 2-0 Vicryl in subcutaneous tissue and a 3-0 nylon in the skin.  The other incisions were closed superficially with 2-0 vicryl and 3-0 nylon.Xeroform, gauze, webril, and Ace wrap, Iceman, and brace locked at 0 degrees were applied to the knee.     Instrument, sponge, and needle counts were  correct prior to wound closure and at the conclusion of the case.   The patient awoke from anesthesia without difficulty and was transferred to the PACU in stable condition.   POSTOPERATIVE PLAN: She will be nonweightbearing for a total of 4 weeks.  She will begin physical therapy postoperatively.  Placed on aspirin for blood clot prevention  Elspeth LITTIE Parker, MD 5:24 PM

## 2024-01-14 NOTE — Progress Notes (Signed)
   01/14/24 2040  Vitals  Temp 97.6 F (36.4 C)  BP 129/83  MAP (mmHg) 96  BP Location Left Arm  BP Method Automatic  Patient Position (if appropriate) Sitting  Pulse Rate (!) 102  Pulse Rate Source Monitor  Resp 18  MEWS COLOR  MEWS Score Color Green  Oxygen Therapy  SpO2 96 %  O2 Device Nasal Cannula  MEWS Score  MEWS Temp 0  MEWS Systolic 0  MEWS Pulse 1  MEWS RR 0  MEWS LOC 0  MEWS Score 1   Pt admitted to 5N19, alert and oriented. Able to follow commands. Oriented to unit, staff, call light system, fall and belonging protocol. Pt with clothing, cell phone, charger, headphones. Plan of care reviewed with pt.

## 2024-01-14 NOTE — Interval H&P Note (Signed)
 History and Physical Interval Note:  01/14/2024 1:13 PM  Carla Little  has presented today for surgery, with the diagnosis of LEFT KNEE ANTERIOR CRUCIATE LIGAMENT TEAR LEFT PATELLAR INSTABILITY.  The various methods of treatment have been discussed with the patient and family. After consideration of risks, benefits and other options for treatment, the patient has consented to  Procedure(s) with comments: KNEE ARTHROSCOPY WITH ANTERIOR CRUCIATE LIGAMENT (ACL) RECONSTRUCTION WITH HAMSTRING GRAFT (Left) - LEFT KNEE ARTHROSCOPY WITH ANTERIOR CRUCIATE LIGAMENT RECONSTRUCTION/ QUADRICEPS ALLOGRAFT/ MEDIAL PATELLOFEMORAL LIGAMENT RECONSTRUCTION / POSTERIOR INTERAL CORNER REPAIR RECONSTRUCTION, LIGAMENT, MEDIAL PATELLOFEMORAL (Left) as a surgical intervention.  The patient's history has been reviewed, patient examined, no change in status, stable for surgery.  I have reviewed the patient's chart and labs.  Questions were answered to the patient's satisfaction.     [name]

## 2024-01-14 NOTE — Anesthesia Procedure Notes (Signed)
 Procedure Name: Intubation Date/Time: 01/14/2024 1:33 PM  Performed by: Dianne Burnard RAMAN, CRNAPre-anesthesia Checklist: Patient identified, Emergency Drugs available, Suction available and Patient being monitored Patient Re-evaluated:Patient Re-evaluated prior to induction Oxygen Delivery Method: Circle system utilized Preoxygenation: Pre-oxygenation with 100% oxygen Induction Type: IV induction Ventilation: Mask ventilation without difficulty and Oral airway inserted - appropriate to patient size Laryngoscope Size: McGrath and 3 Grade View: Grade I Tube type: Oral Tube size: 7.5 mm Number of attempts: 1 Airway Equipment and Method: Stylet, Oral airway, Video-laryngoscopy and Patient positioned with wedge pillow Placement Confirmation: ETT inserted through vocal cords under direct vision, positive ETCO2 and breath sounds checked- equal and bilateral Secured at: 21 cm Tube secured with: Tape Dental Injury: Teeth and Oropharynx as per pre-operative assessment  Comments: Pt with great cervical ROM, placed in sniffing position with multiple blankets, pre oxygenated 5 minutes.

## 2024-01-14 NOTE — Progress Notes (Signed)
 Dr. Keneth made aware of patient's elevated heart rate this morning. See flowsheet. Ok per Dr. Keneth.

## 2024-01-14 NOTE — Anesthesia Procedure Notes (Signed)
 Anesthesia Regional Block: Adductor canal block   Pre-Anesthetic Checklist: , timeout performed,  Correct Patient, Correct Site, Correct Laterality,  Correct Procedure, Correct Position, site marked,  Risks and benefits discussed,  Surgical consent,  Pre-op evaluation,  At surgeon's request and post-op pain management  Laterality: Left  Prep: Maximum Sterile Barrier Precautions used, chloraprep       Needles:  Injection technique: Single-shot  Needle Type: Echogenic Needle      Needle Gauge: 20     Additional Needles:   Procedures:,,,, ultrasound used (permanent image in chart),,    Narrative:  Start time: 01/14/2024 11:50 AM End time: 01/14/2024 11:52 AM Injection made incrementally with aspirations every 5 mL.  Performed by: Personally  Anesthesiologist: Keneth Lynwood POUR, MD

## 2024-01-15 LAB — CBC
HCT: 37.1 % (ref 36.0–46.0)
Hemoglobin: 11.6 g/dL — ABNORMAL LOW (ref 12.0–15.0)
MCH: 28 pg (ref 26.0–34.0)
MCHC: 31.3 g/dL (ref 30.0–36.0)
MCV: 89.6 fL (ref 80.0–100.0)
Platelets: 304 K/uL (ref 150–400)
RBC: 4.14 MIL/uL (ref 3.87–5.11)
RDW: 17.3 % — ABNORMAL HIGH (ref 11.5–15.5)
WBC: 12.4 K/uL — ABNORMAL HIGH (ref 4.0–10.5)
nRBC: 0 % (ref 0.0–0.2)

## 2024-01-15 LAB — BASIC METABOLIC PANEL WITH GFR
Anion gap: 12 (ref 5–15)
BUN: 6 mg/dL (ref 6–20)
CO2: 24 mmol/L (ref 22–32)
Calcium: 7.6 mg/dL — ABNORMAL LOW (ref 8.9–10.3)
Chloride: 103 mmol/L (ref 98–111)
Creatinine, Ser: 0.65 mg/dL (ref 0.44–1.00)
GFR, Estimated: >60 mL/min (ref >60)
Glucose, Bld: 111 mg/dL — ABNORMAL HIGH (ref 70–99)
Potassium: 3.9 mmol/L (ref 3.5–5.1)
Sodium: 139 mmol/L (ref 135–145)

## 2024-01-15 LAB — CREATININE, SERUM
Creatinine, Ser: 0.7 mg/dL (ref 0.44–1.00)
GFR, Estimated: >60 mL/min (ref >60)

## 2024-01-15 MED ORDER — LORATADINE 10 MG PO TABS: 10.0000 mg | ORAL_TABLET | Freq: Every day | ORAL | Status: DC

## 2024-01-15 MED ORDER — LORATADINE 10 MG PO TABS
10.0000 mg | ORAL_TABLET | Freq: Once | ORAL | Status: AC
  Administered 2024-01-15: 10 mg via ORAL
  Filled 2024-01-15: qty 1

## 2024-01-15 MED ORDER — LORATADINE 10 MG PO TABS
10.0000 mg | ORAL_TABLET | Freq: Every day | ORAL | Status: DC | PRN
  Administered 2024-01-15: 10 mg via ORAL
  Filled 2024-01-15: qty 1

## 2024-01-15 NOTE — [place] FL2 (Signed)
 [place]  MEDICAID FL2 LEVEL OF CARE FORM     IDENTIFICATION  Patient Name: Carla Little Birthdate: Mar 05, 1991 Sex: female Admission Date (Current Location): 01/14/2024  [place] and [place] Number:  [place] [id number] Facility and Address:  [facility], 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   Rupture of posterior cruciate ligament of left knee 01/14/2024   Acute medial meniscus tear of left knee 01/14/2024   Injury of posterolateral corner of left knee 01/14/2024   Patellar instability of left knee 01/14/2024   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   Gait abnormality 03/12/2023   GERD (gastroesophageal reflux disease) 03/06/2023   Acute peptic ulcer of stomach 03/06/2023   Pain in left ankle and joints of left foot 03/27/2022   Anemia    Elevated LFTs    Transaminasemia    Morbid obesity (HCC) 04/21/2021   Acute respiratory failure with hypoxia (HCC)    Pneumonia due to COVID-19 virus 04/12/2021   Hypomagnesemia 04/12/2021   Guillain Barr syndrome 12/21/2020   PUD (peptic ulcer disease) 12/09/2020   Class 3 obesity (HCC) 12/09/2020   Sinus tachycardia 12/09/2020   Depression    Iron  deficiency anemia due to chronic blood loss    Left knee dislocation 11/01/2020   Knee dislocation, left, initial encounter 11/01/2020   Altered mental state 06/08/2017   Encephalopathy, portal systemic (HCC)  06/08/2017   Multifocal pneumonia 06/08/2017   Hypokalemia 01/14/2017   Acute cystitis without hematuria 01/14/2017   Cellulitis, trunk 01/14/2017    Orientation RESPIRATION BLADDER Height & Weight     Self, Time, Situation, Place  Normal   Weight: (!) 405 lb 6.8 oz (183.9 kg) Height:  5' 4 (162.6 cm)  BEHAVIORAL SYMPTOMS/MOOD NEUROLOGICAL BOWEL NUTRITION STATUS        Diet (see discharge summary)  AMBULATORY STATUS COMMUNICATION OF NEEDS Skin   Total Care Verbally Surgical wounds                       Personal Care Assistance Level of Assistance  Bathing, Feeding, Dressing Bathing Assistance: Maximum assistance Feeding assistance: Limited assistance Dressing Assistance: Maximum assistance     Functional Limitations Info  Sight, Hearing, Speech Sight Info: Adequate Hearing Info: Adequate Speech Info: Adequate    SPECIAL CARE FACTORS FREQUENCY  PT (By licensed PT), OT (By licensed OT)     PT Frequency: 5x week OT Frequency: 5x week            Contractures Contractures Info: Not present    Additional Factors Info  Code Status, Allergies Code Status Info: full Allergies Info: Azithromycin            Current Medications (01/15/2024):  This is the current hospital active medication list Current Facility-Administered Medications  Medication Dose Route Frequency Provider Last Rate Last Admin   0.9 %  sodium chloride  infusion   Intravenous Continuous Genelle Standing, MD 125 mL/hr at 01/15/24  0539 New Bag at 01/15/24 0539   acetaminophen  (TYLENOL ) tablet 1,000 mg  1,000 mg Oral Q6H Genelle Standing, MD   1,000 mg at 01/15/24 9047   acetaminophen  (TYLENOL ) tablet 325-650 mg  325-650 mg Oral Q6H PRN Genelle Standing, MD       ascorbic acid  (VITAMIN C) tablet 500 mg  500 mg Oral Daily Genelle Standing, MD   500 mg at 01/15/24 9051   buPROPion  (WELLBUTRIN  XL) 24 hr tablet 300 mg  300 mg Oral Daily Genelle Standing, MD   300 mg at 01/15/24 9050   busPIRone  (BUSPAR )  tablet 20 mg  20 mg Oral TID Bokshan, Steven, MD   20 mg at 01/15/24 9051   cholecalciferol  (VITAMIN D3) 25 MCG (1000 UNIT) tablet 2,000 Units  2,000 Units Oral Daily Genelle Standing, MD   2,000 Units at 01/15/24 0948   [START ON 01/20/2024] cyanocobalamin  (VITAMIN B12) injection 1,000 mcg  1,000 mcg Intramuscular Q30 days Genelle Standing, MD       docusate sodium  (COLACE) capsule 100 mg  100 mg Oral BID Genelle Standing, MD   100 mg at 01/15/24 0949   doxepin (SINEQUAN) capsule 25 mg  25 mg Oral QHS Bokshan, Steven, MD   25 mg at 01/14/24 2220   enoxaparin  (LOVENOX ) injection 40 mg  40 mg Subcutaneous Q24H Bokshan, Steven, MD   40 mg at 01/15/24 9050   folic acid  (FOLVITE ) tablet 1 mg  1 mg Oral Daily Bokshan, Steven, MD   1 mg at 01/15/24 9050   HYDROmorphone  (DILAUDID ) injection 0.5-1 mg  0.5-1 mg Intravenous Q4H PRN Genelle Standing, MD   1 mg at 01/14/24 2130   hydrOXYzine  (ATARAX ) tablet 25 mg  25 mg Oral QHS Bokshan, Steven, MD   25 mg at 01/14/24 2129   linaclotide  (LINZESS ) capsule 290 mcg  290 mcg Oral QAC breakfast Genelle Standing, MD   290 mcg at 01/15/24 9051   loperamide (IMODIUM) capsule 2 mg  2 mg Oral Q6H PRN Genelle Standing, MD       loratadine  (CLARITIN ) tablet 10 mg  10 mg Oral Daily PRN Magnant, Charles L, PA-C   10 mg at 01/15/24 1213   melatonin tablet 10 mg  10 mg Oral QHS Genelle Standing, MD   10 mg at 01/14/24 2129   methocarbamol  (ROBAXIN ) tablet 500 mg  500 mg Oral Q6H PRN Genelle Standing, MD   500 mg at 01/15/24 0252   multivitamin with minerals tablet 1 tablet  1 tablet Oral Daily Genelle Standing, MD   1 tablet at 01/15/24 9050   ondansetron  (ZOFRAN ) tablet 4 mg  4 mg Oral Q6H PRN Genelle Standing, MD       Or   ondansetron  (ZOFRAN ) injection 4 mg  4 mg Intravenous Q6H PRN Genelle Standing, MD       oxyCODONE  (Oxy IR/ROXICODONE ) immediate release tablet 10-15 mg  10-15 mg Oral Q4H PRN Genelle Standing, MD   15 mg at 01/15/24 1310   oxyCODONE  (Oxy IR/ROXICODONE ) immediate  release tablet 5-10 mg  5-10 mg Oral Q4H PRN Genelle Standing, MD       pantoprazole  (PROTONIX ) EC tablet 40 mg  40 mg Oral Daily Bokshan, Steven, MD   40 mg at 01/15/24 9051   polyethylene glycol (MIRALAX  / GLYCOLAX ) packet 17 g  17 g Oral BID PRN Genelle Standing, MD       pregabalin  (LYRICA ) capsule 100 mg  100 mg Oral TID Genelle Standing, MD   100 mg at 01/15/24  9051   pregabalin  (LYRICA ) capsule 25 mg  25 mg Oral TID Bokshan, Steven, MD   25 mg at 01/15/24 9050   senna-docusate (Senokot-S) tablet 1 tablet  1 tablet Oral QHS PRN Genelle Standing, MD       venlafaxine XR (EFFEXOR-XR) 24 hr capsule 225 mg  225 mg Oral Daily Bokshan, Steven, MD   225 mg at 01/15/24 9050     Discharge Medications: Please see discharge summary for a list of discharge medications.  Relevant Imaging Results:  Relevant Lab Results:   Additional Information SSN: [id number]  Bridget Cordella Simmonds, LCSW

## 2024-01-15 NOTE — Plan of Care (Signed)

## 2024-01-15 NOTE — Progress Notes (Signed)
 RN called and spoke with Herlene Calix, PA about patient, I rounded on her this AM and looked at her leg and dressing. Noticed her dressing and bedpad was soaked with serosanguinous fluid. RN notified Catering manager he came and assessed stated we can only reinforce and wait for MD or PA orders. Patient also stated that she was having some itching from the anaesthesia and that she would like some Claritin  for that she had a 1x dose last night that really helped. Herlene, [place] stated that he would speak to Dr. Genelle and would let me know how to proceed and would place Claritin  order.

## 2024-01-15 NOTE — Progress Notes (Signed)
 Pt arrived from PACU at 2030, s/p ACL repair. Pt complaining of generalized itching, no rash or reddened areas noted. Unsure if medication related, however reports similar feeling earlier today after receiving fentanyl medication. Pt did receive dilaudid  at 2130, other than than tylenol  she has only received her regularly taken medications. Requesting medication for itching. New orders noted per Dr. Vernetta.

## 2024-01-15 NOTE — Consult Note (Signed)
   Subjective:  Patient reports pain as mild, tolerating well with pain medication. Vitals stable overnight, mild tachycardia consistent with pain. Tolerating PO. Foley in place.  Objective:   VITALS:   Vitals:   01/14/24 2024 01/14/24 2040 01/15/24 0000 01/15/24 0508  BP: (!) 129/90 129/83 104/80 114/72  Pulse:  (!) 102 (!) 104 (!) 103  Resp: 17 18 15 18   Temp: 97.9 F (36.6 C) 97.6 F (36.4 C) 97.7 F (36.5 C) 98.5 F (36.9 C)  TempSrc:      SpO2: 100% 96% 92% 96%  Weight:      Height:        Left knee dressing is CDI. Fires TA/GS left foot. 2+DP pulse.     Lab Results  Component Value Date   WBC 12.4 (H) 01/15/2024   HGB 11.6 (L) 01/15/2024   HCT 37.1 01/15/2024   MCV 89.6 01/15/2024   PLT 304 01/15/2024     Assessment/Plan:  1 Day Post-Op status post left knee multiligamentous knee reconstruction  - Expected postop acute blood loss anemia - Foley in place, plan for DC on 10/22 afternoon after patient has mobilized - Patient to work with PT to optimize mobilization safely - DVT ppx - SCDs, ambulation, Lovenox  - NWB operative left extremity - Pain control - multimodal pain management, ATC acetaminophen  in conjunction with as needed narcotic (oxycodone ), although this should be minimized with other modalities  - Discharge planning pending CM, likely will need additional day for mobility work with PT and pain control.   Rylie Limburg 01/15/2024, 7:28 AM

## 2024-01-15 NOTE — Anesthesia Postprocedure Evaluation (Signed)
 Anesthesia Post Note  Patient: Carla Little  Procedure(s) Performed: KNEE ARTHROSCOPY WITH ANTERIOR  AND POSTERIOR CRUCIATE LIGAMENT (ACL) RECONSTRUCTION WITH ALLOGRAFTS (Left: Knee) RECONSTRUCTION, LIGAMENT, MEDIAL PATELLOFEMORAL (Left: Knee)     Patient location during evaluation: PACU Anesthesia Type: General Level of consciousness: awake and alert Pain management: pain level controlled Vital Signs Assessment: post-procedure vital signs reviewed and stable Respiratory status: spontaneous breathing, nonlabored ventilation, respiratory function stable and patient connected to nasal cannula oxygen Cardiovascular status: blood pressure returned to baseline and stable Postop Assessment: no apparent nausea or vomiting Anesthetic complications: no   No notable events documented.  Last Vitals:  Vitals:   01/15/24 1733 01/15/24 1736  BP: (!) 95/55 (!) 95/55  Pulse: 100 (!) 111  Resp: 18   Temp: 36.7 C   SpO2: 94% 93%    Last Pain:  Vitals:   01/15/24 1733  TempSrc: Oral  PainSc:    Pain Goal: Patients Stated Pain Goal: 0 (01/14/24 1204)                 Debby FORBES Like

## 2024-01-15 NOTE — Progress Notes (Signed)
 Dressings reinforced with ABD and acewrap per verbal order from Dr. Genelle

## 2024-01-15 NOTE — TOC Initial Note (Addendum)
 Transition of Care [facility]) - Initial/Assessment Note    Patient Details  Name: Carla Little MRN: [id number] Date of Birth: [birth year]  [facility] CM/SW Contact:    Bridget Cordella Simmonds, LCSW Phone Number: 01/15/2024, 4:39 PM  Clinical Narrative:   CSW met with pt regarding PT recommendation for SNF.  Pt from [facility], does want to return to [place], does home to eventually move back out into the community.  Permission given to speak with Rahmel, significant other.  Referral sent to [facility].   CSW confirmed with Debbie/Cypress that pt can return at DC.               Expected Discharge Plan: Skilled Nursing Facility Barriers to Discharge: Continued Medical Work up, SNF Pending bed offer   Patient Goals and CMS Choice Patient states their goals for this hospitalization and ongoing recovery are:: be independent   Choice offered to / list presented to : Patient (pt wants to return to [facility])      Expected Discharge Plan and Services In-house Referral: Clinical Social Work   Post Acute Care Choice: Skilled Nursing Facility Living arrangements for the past 2 months: Skilled Holiday representative (from [facility])                                      Prior Living Arrangements/Services Living arrangements for the past 2 months: Skilled Holiday representative (from [facility]) Lives with:: Facility Resident Patient language and need for interpreter reviewed:: Yes Do you feel safe going back to the place where you live?: Yes      Need for Family Participation in Patient Care: Yes (Comment) Care giver support system in place?: Yes (comment) Current home services: Other (comment) (na) Criminal Activity/Legal Involvement Pertinent to Current Situation/Hospitalization: No - Comment as needed  Activities of Daily Living   ADL Screening (condition at time of admission) Independently performs ADLs?: Yes (appropriate for developmental  age) Is the patient deaf or have difficulty hearing?: No Does the patient have difficulty seeing, even when wearing glasses/contacts?: No Does the patient have difficulty concentrating, remembering, or making decisions?: No  Permission Sought/Granted Permission sought to share information with : Family Supports Permission granted to share information with : Yes, Verbal Permission Granted  Share Information with NAME: Rahmel, sig other.  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   Rupture of posterior cruciate ligament of left knee 01/14/2024   Acute medial meniscus tear of left knee 01/14/2024   Injury of posterolateral corner of left knee 01/14/2024   Patellar instability of left knee 01/14/2024   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   Gait abnormality 03/12/2023   GERD (gastroesophageal reflux disease) 03/06/2023   Acute peptic ulcer of stomach 03/06/2023   Pain in left ankle and joints of left foot 03/27/2022   Anemia    Elevated LFTs  Transaminasemia    Morbid obesity (HCC) 04/21/2021   Acute respiratory failure with hypoxia (HCC)    Pneumonia due to COVID-19 virus 04/12/2021   Hypomagnesemia 04/12/2021   Guillain Barr syndrome 12/21/2020   PUD (peptic ulcer disease) 12/09/2020   Class 3 obesity (HCC) 12/09/2020   Sinus tachycardia 12/09/2020   Depression    Iron  deficiency anemia due  to chronic blood loss    Left knee dislocation 11/01/2020   Knee dislocation, left, initial encounter 11/01/2020   Altered mental state 06/08/2017   Encephalopathy, portal systemic (HCC) 06/08/2017   Multifocal pneumonia 06/08/2017   Hypokalemia 01/14/2017   Acute cystitis without hematuria 01/14/2017   Cellulitis, trunk 01/14/2017   PCP:  Andi Jointer, DO Pharmacy:   [address] - [place], [place] - [phone] [place] [address] Phone: [phone] Fax: [phone]  [facility] - [place], [place] - [address] [place] [address] Phone: [phone] Fax: [phone]  [facility] - [place], [place] - [address] [place] [address] Phone: [phone] Fax: [phone]     Social Drivers of Health (SDOH) Social History: SDOH Screenings   Food Insecurity: No Food Insecurity (01/14/2024)  Housing: Low Risk  (01/14/2024)  Transportation Needs: No Transportation Needs (01/14/2024)  Utilities: Not At Risk (01/14/2024)  Tobacco Use: Low Risk  (01/14/2024)   SDOH Interventions:     Readmission Risk Interventions    04/28/2021    4:01 PM  Readmission Risk Prevention Plan  Transportation Screening Complete  Medication Review (RN Care Manager) Complete  PCP or Specialist appointment within 3-5 days of discharge Complete  HRI or Home Care Consult Complete  SW Recovery Care/Counseling Consult Complete  Palliative Care Screening Not Applicable  Skilled Nursing Facility Complete

## 2024-01-15 NOTE — Evaluation (Signed)
 Physical Therapy Evaluation Patient Details Name: Carla Little MRN: [id number] DOB: 1990/09/01 Today's Date: 01/15/2024  History of Present Illness  Pt is a 33 y.o. female admitted 01/14/24 for left multiligamentous knee injury. Pt s/p ACL-R with  quadriceps allograft, PCL-R with tibialis anterior allograft, medial meniscal debridement, medial patellofemoral ligament reconstruction with gracilis allograft, and posterior lateral corner repair 10/21. PMHx: obesity, GERD, Guillain Barr syndrome, PUD, depression, and residual dislocation of L knee.   Clinical Impression  Pt admitted with above diagnosis. PTA, pt reports independence with bed mobility, stand pivot transfers, and w/c mobility. She required supervision for transfers into the shower and assistance with LB dressing, toileting, and all IADLs. She has resided at [facility] for the past ~3 years. Pt currently with functional limitations due to the deficits listed below (see PT Problem List). She required CGA for rolling, minA for supine<>sit, and modA x2 for sit<>stand using RW. Pt maintained LLE NWB at all times. She was unable to pivot to recliner chair on her right despite +2 assist. Pt is currently limited by pain, generalized BLE weakness, impaired standing balance, and decreased activity tolerance. Pt will benefit from acute skilled PT to increase her independence and safety with mobility to allow discharge. Recommend continued inpatient follow up therapy, <3 hours/day.    If plan is discharge home, recommend the following: Two people to help with walking and/or transfers;A lot of help with bathing/dressing/bathroom;Assistance with cooking/housework;Assist for transportation;Help with stairs or ramp for entrance   Can travel by private vehicle   No    Equipment Recommendations Hospital bed;Hoyer lift  Recommendations for Other Services       Functional Status Assessment Patient has had a recent decline in their  functional status and demonstrates the ability to make significant improvements in function in a reasonable and predictable amount of time.     Precautions / Restrictions Precautions Precautions: Fall Recall of Precautions/Restrictions: Intact Restrictions Weight Bearing Restrictions Per Provider Order: Yes LLE Weight Bearing Per Provider Order: Non weight bearing      Mobility  Bed Mobility Overal bed mobility: Needs Assistance Bed Mobility: Rolling, Supine to Sit, Sit to Supine Rolling: Used rails, Contact guard assist   Supine to sit: HOB elevated, Used rails, Min assist Sit to supine: HOB elevated, Used rails, Min assist   General bed mobility comments: Pt sat up on R side of bed with increased time. Cues for sequencing. Assist to manage LLE. Pt elevated/lowered trunk with use of bed rail. Pt scooted fwd/bkwd with BUE support. She rolled R/L for bed pad to be changed underneath her with help of bed rail.    Transfers Overall transfer level: Needs assistance Equipment used: Rolling walker (2 wheels) Transfers: Sit to/from Stand Sit to Stand: From elevated surface, Mod assist, +2 physical assistance, +2 safety/equipment           General transfer comment: Pt stood from raised bed height. She maintained LLE NWB at all times. Pt powered up with modA x2. She maintained static stance briefly and returned to bed. She attempted 2 more times, fatiguing and getting less hip clearance from bed. Use of bed pad to facilitate hip extension. Pt was unable to pivot on R foot to transfer to recliner chair.    Ambulation/Gait               General Gait Details: Unable (Pt has been using manual w/c at baseline.)  Stairs  Wheelchair Mobility     Tilt Bed    Modified Rankin (Stroke Patients Only)       Balance Overall balance assessment: Needs assistance Sitting-balance support: Bilateral upper extremity supported, Feet supported Sitting balance-Leahy  Scale: Fair Sitting balance - Comments: Pt sat EOB with close supervision.   Standing balance support: Bilateral upper extremity supported, During functional activity, Reliant on assistive device for balance Standing balance-Leahy Scale: Poor Standing balance comment: Pt dependent on RW and +2 assist.                             Pertinent Vitals/Pain Pain Assessment Pain Assessment: Faces Faces Pain Scale: Hurts even more Pain Location: L knee Pain Descriptors / Indicators: Operative site guarding, Grimacing, Discomfort, Aching Pain Intervention(s): Premedicated before session, Monitored during session, Limited activity within patient's tolerance, Repositioned    Home Living Family/patient expects to be discharged to:: Skilled nursing facility [facility] in [place])                   Additional Comments: Pt has resided in a SNF for ~3 years next month. Pt reports the last time she had therapy was April/May 2025.    Prior Function Prior Level of Function : Needs assist;History of Falls (last six months)       Physical Assist : Mobility (physical);ADLs (physical) Mobility (physical): Transfers;Gait ADLs (physical): Toileting;Dressing;IADLs Mobility Comments: Pt reports getting in and out of bed independently. She transfer via stand pivots without AD, but states she has been holding on arm rests of w/c, modI. Pt propels manual w/c herself, but states she likes when staff push her better. Multiple falls at least 5 in the past 35mo. ADLs Comments: Pt reports bathing in a shower chair. She reports supervision for transfers in/out of the walk-in shower. Dresses herself sitting up. Staff sometimes assist with LB dressing. Wears breifs, staff assist with pericare. Staff manage meals and meds.     Extremity/Trunk Assessment   Upper Extremity Assessment Upper Extremity Assessment: Overall WFL for tasks assessed;Right hand dominant    Lower Extremity  Assessment Lower Extremity Assessment: LLE deficits/detail;Generalized weakness LLE Deficits / Details: Pt POD 1 s/p multiligamentous knee injury repair. Decreased hip/knee AROM. Pt guarded against PROM attempts. Grossly 2+/5. LLE: Unable to fully assess due to pain LLE Sensation: WNL LLE Coordination: decreased gross motor    Cervical / Trunk Assessment Cervical / Trunk Assessment: Other exceptions Cervical / Trunk Exceptions: Increased Body Habitus  Communication   Communication Communication: No apparent difficulties    Cognition Arousal: Alert Behavior During Therapy: WFL for tasks assessed/performed   PT - Cognitive impairments: No apparent impairments                       PT - Cognition Comments: Pt A,Ox4 Following commands: Intact       Cueing Cueing Techniques: Verbal cues, Gestural cues     General Comments General comments (skin integrity, edema, etc.): VSS on RA. Pt bleeding from left lateral stitch. RN aware and reinforced dressing.    Exercises     Assessment/Plan    PT Assessment Patient needs continued PT services  PT Problem List Decreased strength;Decreased range of motion;Decreased activity tolerance;Decreased balance;Decreased mobility;Pain       PT Treatment Interventions DME instruction;Functional mobility training;Therapeutic activities;Therapeutic exercise;Balance training;Patient/family education;Wheelchair mobility training;Gait training    PT Goals (Current goals can be found in the Care Plan section)  Acute Rehab PT Goals Patient Stated Goal: Regain independence PT Goal Formulation: With patient Time For Goal Achievement: 01/29/24 Potential to Achieve Goals: Good Additional Goals Additional Goal #1: Pt will propel manual w/c ~167ft with supervision    Frequency Min 2X/week     Co-evaluation               AM-PAC PT 6 Clicks Mobility  Outcome Measure Help needed turning from your back to your side while in a flat  bed without using bedrails?: A Little Help needed moving from lying on your back to sitting on the side of a flat bed without using bedrails?: A Little Help needed moving to and from a bed to a chair (including a wheelchair)?: Total Help needed standing up from a chair using your arms (e.g., wheelchair or bedside chair)?: Total Help needed to walk in hospital room?: Total Help needed climbing 3-5 steps with a railing? : Total 6 Click Score: 10    End of Session Equipment Utilized During Treatment: Gait belt Activity Tolerance: Patient tolerated treatment well;Patient limited by pain Patient left: in bed;with call bell/phone within reach;with bed alarm set;with nursing/sitter in room Nurse Communication: Mobility status;Need for lift equipment PT Visit Diagnosis: Other abnormalities of gait and mobility (R26.89);Difficulty in walking, not elsewhere classified (R26.2);Pain Pain - Right/Left: Left Pain - part of body: Knee    Time: 1343-1417 PT Time Calculation (min) (ACUTE ONLY): 34 min   Charges:   PT Evaluation $PT Eval Moderate Complexity: 1 Mod   PT General Charges $$ ACUTE PT VISIT: 1 Visit         Randall SAUNDERS, PT, DPT Acute Rehabilitation Services Office: [phone] Secure Chat Preferred  Delon CHRISTELLA Callander 01/15/2024, 3:00 PM

## 2024-01-16 MED ORDER — DIPHENHYDRAMINE HCL 25 MG PO CAPS
50.0000 mg | ORAL_CAPSULE | Freq: Four times a day (QID) | ORAL | Status: DC | PRN
  Administered 2024-01-16: 50 mg via ORAL
  Filled 2024-01-16: qty 2

## 2024-01-16 MED ORDER — OXYCODONE HCL 10 MG PO TABS: 10.0000 mg | ORAL_TABLET | Freq: Four times a day (QID) | ORAL | 0 refills | Status: DC | PRN

## 2024-01-16 NOTE — Progress Notes (Addendum)
 Expand All Collapse All     Subjective:   Patient reports pain as mild, tolerating well with pain medication. Vitals stable overnight, mild tachycardia consistent with pain. Tolerating PO.  Foley removed, worked with PT   Objective:    VITALS:         Vitals:    01/14/24 2024 01/14/24 2040 01/15/24 0000 01/15/24 0508  BP: (!) 129/90 129/83 104/80 114/72  Pulse:   (!) 102 (!) 104 (!) 103  Resp: 17 18 15 18   Temp: 97.9 F (36.6 C) 97.6 F (36.4 C) 97.7 F (36.5 C) 98.5 F (36.9 C)  TempSrc:          SpO2: 100% 96% 92% 96%  Weight:          Height:              Left knee dressing is CDI. Fires TA/GS left foot. 2+DP pulse.        Recent Labs       Lab Results  Component Value Date    WBC 12.4 (H) 01/15/2024    HGB 11.6 (L) 01/15/2024    HCT 37.1 01/15/2024    MCV 89.6 01/15/2024    PLT 304 01/15/2024          Assessment/Plan:   1 Day Post-Op status post left knee multiligamentous knee reconstruction   - Expected postop acute blood loss anemia - Foley in place, plan for DC on 10/22 afternoon after patient has mobilized - Patient to work with PT to optimize mobilization safely - DVT ppx - SCDs, ambulation, Lovenox  - NWB operative left extremity, can use the knee immobilizer as needed although understand it may not perfectly stay on during ambulation - Pain control - multimodal pain management, ATC acetaminophen  in conjunction with as needed narcotic (oxycodone ), although this should be minimized with other modalities  - Discharge planning pending CM, okay for DC in afternoon 10/23 after patient works with PT     ELSPETH PARKER 01/15/2024, 7:28 AM

## 2024-01-16 NOTE — TOC Transition Note (Signed)
 Transition of Care [facility]) - Discharge Note   Patient Details  Name: Carla Little MRN: [id number] Date of Birth: [birth year]  Transition of Care [facility]) CM/SW Contact:  Bridget Cordella Simmonds, LCSW Phone Number: 01/16/2024, 2:07 PM   Clinical Narrative:   Pt discharging to [facility], room A22-1. RN report to [phone].   PTAR called 1400.   1000: CSW confirmed with [facility] that they can receive pt today.  Per Marval, LTC pt, 3 midnights not necessary for return.      Final next level of care: Skilled Nursing Facility Barriers to Discharge: Barriers Resolved   Patient Goals and CMS Choice Patient states their goals for this hospitalization and ongoing recovery are:: be independent   Choice offered to / list presented to : Patient (pt wants to return to [facility])      Discharge Placement              Patient chooses bed at:  ([facility]) Patient to be transferred to facility by: ptar Name of family member notified: friend Rahmel, left message Patient and family notified of of transfer: 01/16/24  Discharge Plan and Services Additional resources added to the After Visit Summary for   In-house Referral: Clinical Social Work   Post Acute Care Choice: Skilled Nursing Facility                               Social Drivers of Health (SDOH) Interventions SDOH Screenings   Food Insecurity: No Food Insecurity (01/14/2024)  Housing: Low Risk  (01/14/2024)  Transportation Needs: No Transportation Needs (01/14/2024)  Utilities: Not At Risk (01/14/2024)  Tobacco Use: Low Risk  (01/14/2024)     Readmission Risk Interventions    04/28/2021    4:01 PM  Readmission Risk Prevention Plan  Transportation Screening Complete  Medication Review (RN Care Manager) Complete  PCP or Specialist appointment within 3-5 days of discharge Complete  HRI or Home Care Consult Complete  SW Recovery Care/Counseling Consult Complete  Palliative Care  Screening Not Applicable  Skilled Nursing Facility Complete

## 2024-01-16 NOTE — Discharge Summary (Signed)
 Patient ID: Carla Little MRN: [id number] DOB/AGE: 12-04-1990 33 y.o.  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   Rupture of posterior cruciate ligament of left knee   Acute medial meniscus tear of left knee   Injury of posterolateral corner of left knee   Patellar instability of left knee   Past Medical History:  Diagnosis Date   Anxiety    Class 3 obesity (HCC) 12/09/2020   Depression    GERD (gastroesophageal reflux disease)    Guillain Barr syndrome    Nonalcoholic steatohepatitis (NASH) 12/09/2020   Obesity    PUD (peptic ulcer disease) 12/09/2020    Surgeries: Procedure(s): KNEE ARTHROSCOPY WITH ANTERIOR  AND POSTERIOR CRUCIATE LIGAMENT (ACL) RECONSTRUCTION WITH ALLOGRAFTS RECONSTRUCTION, LIGAMENT, MEDIAL PATELLOFEMORAL on 01/14/2024   Consultants (if any):   Discharged Condition: Improved  Hospital Course: Carla Little is an 33 y.o. female who was admitted 01/14/2024 with a diagnosis of ACL tear and went to the operating room on 01/14/2024 and underwent the above named procedures.    She was given perioperative antibiotics:  Anti-infectives (From admission, onward)    Start     Dose/Rate Route Frequency Ordered Stop   01/14/24 1100  ceFAZolin  (ANCEF ) IVPB 3g/150 mL premix        3 g 300 mL/hr over 30 Minutes Intravenous On call to O.R. 01/14/24 1009 01/14/24 1338     .  She was given sequential compression devices, early ambulation, and appropriate chemoprophylaxis for DVT prophylaxis.  She benefited maximally from the hospital stay and there were no complications.    Recent vital signs:  Vitals:   01/16/24 0530 01/16/24 0752  BP:  115/69  Pulse: (!) 110 (!) 108  Resp:  20  Temp:  98.4 F (36.9 C)  SpO2: 92% (!) 84%    Recent laboratory studies:  Lab Results  Component Value  Date   HGB 11.6 (L) 01/15/2024   HGB 13.9 01/14/2024   HGB 12.7 08/30/2023   Lab Results  Component Value Date   WBC 12.4 (H) 01/15/2024   PLT 304 01/15/2024   Lab Results  Component Value Date   INR 1.2 04/25/2021   Lab Results  Component Value Date   NA 139 01/15/2024   K 3.9 01/15/2024   CL 103 01/15/2024   CO2 24 01/15/2024   BUN 6 01/15/2024   CREATININE 0.65 01/15/2024   GLUCOSE 111 (H) 01/15/2024    Discharge Medications:   Allergies as of 01/16/2024       Reactions   Azithromycin  Shortness Of Breath, Nausea And Vomiting        Medication List     TAKE these medications    acetaminophen  325 MG tablet Commonly known as: TYLENOL  Take 2 tablets (650 mg total) by mouth every 6 (six) hours as needed for mild pain (or Fever >/= 101).   APPLE CIDER VINEGAR PO Take 2 each by mouth daily. [facility] Apple Cider Vinegar Gummies   ascorbic acid  500 MG tablet Commonly known as: VITAMIN C Take 500 mg by mouth daily.   BRAIN SUPPORT PO Take 3 each by mouth daily. [place] Matcha Mind Cognitive Gummies   buPROPion  300 MG 24 hr tablet Commonly known as: WELLBUTRIN  XL Take 300 mg by mouth daily.   busPIRone  10 MG tablet Commonly known as: BUSPAR  Take 20  mg by mouth 3 (three) times daily.   cholecalciferol  25 MCG (1000 UNIT) tablet Commonly known as: VITAMIN D3 Take 2,000 Units by mouth daily.   cyanocobalamin  1000 MCG/ML injection Commonly known as: VITAMIN B12 Inject 1 mL (1,000 mcg total) into the muscle every 30 (thirty) days.   doxepin 25 MG capsule Commonly known as: SINEQUAN Take 25 mg by mouth at bedtime.   folic acid  1 MG tablet Commonly known as: FOLVITE  Take 1 tablet (1 mg total) by mouth daily.   hydrOXYzine  25 MG tablet Commonly known as: ATARAX  Take 25 mg by mouth at bedtime.   lansoprazole 30 MG capsule Commonly known as: PREVACID Take 30 mg by mouth daily.   linaclotide  290 MCG Caps capsule Commonly known as: LINZESS  Take 1 capsule  (290 mcg total) by mouth daily before breakfast.   loperamide 2 MG capsule Commonly known as: IMODIUM Take 2 mg by mouth every 6 (six) hours as needed for diarrhea or loose stools.   Melatonin 5 MG Chew Chew 10 mg by mouth at bedtime. [place] Dreamy Sleep Gummies   methocarbamol  500 MG tablet Commonly known as: ROBAXIN  Take 1 tablet (500 mg total) by mouth every 6 (six) hours as needed for muscle spasms.   multivitamin capsule Take 1 capsule by mouth daily.   Narcan 4 MG/0.1ML Liqd nasal spray kit Generic drug: naloxone Place 1 spray into the nose once.   Oxycodone  HCl 10 MG Tabs Take 1 tablet (10 mg total) by mouth every 6 (six) hours as needed (pain).   polyethylene glycol 17 g packet Commonly known as: MIRALAX  / GLYCOLAX  Take 17 g by mouth 2 (two) times daily as needed for moderate constipation.   pregabalin  100 MG capsule Commonly known as: LYRICA  Take 100 mg by mouth 3 (three) times daily.   pregabalin  25 MG capsule Commonly known as: LYRICA  Take 25 mg by mouth 3 (three) times daily.   senna-docusate 8.6-50 MG tablet Commonly known as: Senokot-S Take 1 tablet by mouth at bedtime as needed for mild constipation.   Venlafaxine HCl 225 MG Tb24 Take 1 tablet by mouth daily.        Diagnostic Studies: DG Knee 1-2 Views Left Result Date: 01/14/2024 CLINICAL DATA:  Knee arthroscopy EXAM: DG KNEE 1-2V*L* COMPARISON:  12/21/2023 FLUOROSCOPY TIME:  Radiation Exposure Index (as provided by the fluoroscopic device): 22.08 If the device does not provide the exposure index: Fluoroscopy Time:  80 seconds Number of Acquired Images:  1 FINDINGS: Single spot film shows arthroscopy within the knee joint. IMPRESSION: Localization for knee arthroscopy Electronically Signed   By: Oneil Devonshire M.D.   On: 01/14/2024 21:59   DG C-Arm 1-60 Min-No Report Result Date: 01/14/2024 Fluoroscopy was utilized by the requesting physician.  No radiographic interpretation.   DG C-Arm 1-60 Min-No  Report Result Date: 01/14/2024 Fluoroscopy was utilized by the requesting physician.  No radiographic interpretation.   DG C-Arm 1-60 Min-No Report Result Date: 01/14/2024 Fluoroscopy was utilized by the requesting physician.  No radiographic interpretation.   DG Shoulder Right Result Date: 12/21/2023 CLINICAL DATA:  Shoulder pain and knee pain. EXAM: RIGHT SHOULDER - 2+ VIEW COMPARISON:  None Available. FINDINGS: There is no evidence of an acute fracture or dislocation. There is no evidence of arthropathy or other focal bone abnormality. Soft tissues are unremarkable. IMPRESSION: Negative. Electronically Signed   By: Suzen Dials M.D.   On: 12/21/2023 14:56   DG Shoulder Left Result Date: 12/21/2023 CLINICAL DATA:  Shoulder pain  and knee pain. EXAM: LEFT SHOULDER - 2+ VIEW COMPARISON:  None Available. FINDINGS: There is no evidence of fracture or dislocation. There is no evidence of arthropathy or other focal bone abnormality. Soft tissues are unremarkable. IMPRESSION: Negative. Electronically Signed   By: Suzen Dials M.D.   On: 12/21/2023 14:51   DG Knee Complete 4 Views Left Result Date: 12/21/2023 CLINICAL DATA:  Shoulder pain and knee pain. EXAM: LEFT KNEE - COMPLETE 4+ VIEW COMPARISON:  October 21, 2023 FINDINGS: No evidence of an acute fracture. A small chronic deformity is seen involving the lateral tip of the lateral tibial plateau. There is mild, stable lateral subluxation of the left patella and left tibia. No evidence of arthropathy or other focal bone abnormality. A small joint effusion is seen. IMPRESSION: 1. Small joint effusion. 2. Mild, stable lateral subluxation of the left patella and left tibia. Electronically Signed   By: Suzen Dials M.D.   On: 12/21/2023 14:49   DG Knee Complete 4 Views Right Result Date: 12/21/2023 CLINICAL DATA:  Shoulder pain and knee pain. EXAM: RIGHT KNEE - COMPLETE 4+ VIEW COMPARISON:  October 21, 2023 FINDINGS: The study is limited secondary  to the patient's body habitus. No evidence of an acute fracture or dislocation. No evidence of arthropathy or other focal bone abnormality. Soft tissues are unremarkable. IMPRESSION: No acute osseous abnormality. Electronically Signed   By: Suzen Dials M.D.   On: 12/21/2023 14:46    Disposition: Discharge disposition: 01-Home or Self Care          Follow-up Information     Genelle Standing, MD Follow up.   Specialty: Orthopedic Surgery Contact information: [address] [place] [address] [phone]                  Signed: STANDING GENELLE 01/16/2024, 8:03 AM

## 2024-01-16 NOTE — Discharge Instructions (Signed)
 Discharge Instructions    Attending Surgeon: Elspeth Parker, MD Office Phone Number: [phone]   Diagnosis and Procedures:    Surgeries Performed: Left knee multiligamentous reconstruction  Discharge Plan:    Diet: Resume usual diet. Begin with light or bland foods.  Drink plenty of fluids.  Activity:  Non weight bearing left with knee immobilizer as tolerated. You are advised to go home directly from the hospital or surgical center. Restrict your activities.  GENERAL INSTRUCTIONS: 1.  Please apply ice to your wound to help with swelling and inflammation. This will improve your comfort and your overall recovery following surgery.     2. Please call Dr. Danetta office at [phone] with questions Monday-Friday during business hours. If no one answers, please leave a message and someone should get back to the patient within 24 hours. For emergencies please call 911 or proceed to the emergency room.   3. Patient to notify surgical team if experiences any of the following: Bowel/Bladder dysfunction, uncontrolled pain, nerve/muscle weakness, incision with increased drainage or redness, nausea/vomiting and Fever greater than 101.0 F.  Be alert for signs of infection including redness, streaking, odor, fever or chills. Be alert for excessive pain or bleeding and notify your surgeon immediately.  WOUND INSTRUCTIONS:   Leave your dressing, cast, or splint in place until your post operative visit.  Keep it clean and dry.  Always keep the incision clean and dry until the staples/sutures are removed. If there is no drainage from the incision you should keep it open to air. If there is drainage from the incision you must keep it covered at all times until the drainage stops  Do not soak in a bath tub, hot tub, pool, lake or other body of water until 21 days after your surgery and your incision is completely dry and healed.  If you have removable sutures (or staples) they must be  removed 10-14 days (unless otherwise instructed) from the day of your surgery.     1)  Elevate the extremity as much as possible.  2)  Keep the dressing clean and dry.  3)  Please call us  if the dressing becomes wet or dirty.  4)  If you are experiencing worsening pain or worsening swelling, please call.     MEDICATIONS: Resume all previous home medications at the previous prescribed dose and frequency unless otherwise noted Start taking the  pain medications on an as-needed basis as prescribed  Please taper down pain medication over the next week following surgery.  Ideally you should not require a refill of any narcotic pain medication.  Take pain medication with food to minimize nausea. In addition to the prescribed pain medication, you may take over-the-counter pain relievers such as Tylenol .  Do NOT take additional tylenol  if your pain medication already has tylenol  in it.  Aspirin 325mg  daily per instructions on bottle. Narcotic policy: Per [facility] clinic policy, our goal is ensure optimal postoperative pain control with a multimodal pain management strategy. For all [facility] patients, our goal is to wean post-operative narcotic medications by 6 weeks post-operatively, and many times sooner. If this is not possible due to utilization of pain medication prior to surgery, your [facility] doctor will support your acute post-operative pain control for the first 6 weeks postoperatively, with a plan to transition you back to your primary pain team following that. Maralee will work to ensure a Therapist, occupational.       FOLLOWUP INSTRUCTIONS: 1. Follow up at  the Physical Therapy Clinic 3-4 days following surgery. This appointment should be scheduled unless other arrangements have been made.The Physical Therapy scheduling number is [phone] if an appointment has not already been arranged.  2. Contact Dr. Danetta office during office hours at [phone] or the practice after hours line  at [phone] for non-emergencies. For medical emergencies call 911.   Discharge Location: Home

## 2024-01-17 ENCOUNTER — Encounter (HOSPITAL_COMMUNITY): Payer: Self-pay

## 2024-01-17 ENCOUNTER — Other Ambulatory Visit: Payer: Self-pay

## 2024-01-17 ENCOUNTER — Emergency Department (HOSPITAL_COMMUNITY)

## 2024-01-17 ENCOUNTER — Inpatient Hospital Stay (HOSPITAL_COMMUNITY)
Admission: EM | Admit: 2024-01-17 | Discharge: 2024-01-20 | DRG: 193 | Disposition: A | Discharge status: Other Acute Inpatient Hospital | Source: Skilled Nursing Facility | Attending: Family Medicine | Admitting: Family Medicine

## 2024-01-17 DIAGNOSIS — G8918 Other acute postprocedural pain: Secondary | ICD-10-CM | POA: Diagnosis present

## 2024-01-17 DIAGNOSIS — M25562 Pain in left knee: Secondary | ICD-10-CM | POA: Diagnosis present

## 2024-01-17 DIAGNOSIS — R000 Tachycardia, unspecified: Secondary | ICD-10-CM | POA: Diagnosis present

## 2024-01-17 DIAGNOSIS — J9621 Acute and chronic respiratory failure with hypoxia: Secondary | ICD-10-CM | POA: Diagnosis present

## 2024-01-17 DIAGNOSIS — Z6841 Body Mass Index (BMI) 40.0 and over, adult: Secondary | ICD-10-CM

## 2024-01-17 DIAGNOSIS — G6181 Chronic inflammatory demyelinating polyneuritis: Secondary | ICD-10-CM | POA: Diagnosis present

## 2024-01-17 DIAGNOSIS — F32A Depression, unspecified: Secondary | ICD-10-CM | POA: Diagnosis present

## 2024-01-17 DIAGNOSIS — Z79899 Other long term (current) drug therapy: Secondary | ICD-10-CM

## 2024-01-17 DIAGNOSIS — E46 Unspecified protein-calorie malnutrition: Secondary | ICD-10-CM | POA: Insufficient documentation

## 2024-01-17 DIAGNOSIS — Z9889 Other specified postprocedural states: Secondary | ICD-10-CM

## 2024-01-17 DIAGNOSIS — Z1152 Encounter for screening for COVID-19: Secondary | ICD-10-CM

## 2024-01-17 DIAGNOSIS — Z800 Family history of malignant neoplasm of digestive organs: Secondary | ICD-10-CM

## 2024-01-17 DIAGNOSIS — E66813 Obesity, class 3: Secondary | ICD-10-CM | POA: Diagnosis present

## 2024-01-17 DIAGNOSIS — Z881 Allergy status to other antibiotic agents status: Secondary | ICD-10-CM

## 2024-01-17 DIAGNOSIS — Z8249 Family history of ischemic heart disease and other diseases of the circulatory system: Secondary | ICD-10-CM

## 2024-01-17 DIAGNOSIS — R7989 Other specified abnormal findings of blood chemistry: Secondary | ICD-10-CM | POA: Diagnosis present

## 2024-01-17 DIAGNOSIS — G8928 Other chronic postprocedural pain: Secondary | ICD-10-CM | POA: Diagnosis present

## 2024-01-17 DIAGNOSIS — K7581 Nonalcoholic steatohepatitis (NASH): Secondary | ICD-10-CM | POA: Diagnosis present

## 2024-01-17 DIAGNOSIS — K219 Gastro-esophageal reflux disease without esophagitis: Secondary | ICD-10-CM | POA: Diagnosis present

## 2024-01-17 DIAGNOSIS — Z8711 Personal history of peptic ulcer disease: Secondary | ICD-10-CM

## 2024-01-17 DIAGNOSIS — E8809 Other disorders of plasma-protein metabolism, not elsewhere classified: Secondary | ICD-10-CM | POA: Diagnosis present

## 2024-01-17 DIAGNOSIS — Z7952 Long term (current) use of systemic steroids: Secondary | ICD-10-CM

## 2024-01-17 DIAGNOSIS — R519 Headache, unspecified: Secondary | ICD-10-CM | POA: Diagnosis present

## 2024-01-17 DIAGNOSIS — E662 Morbid (severe) obesity with alveolar hypoventilation: Secondary | ICD-10-CM | POA: Diagnosis present

## 2024-01-17 DIAGNOSIS — F419 Anxiety disorder, unspecified: Secondary | ICD-10-CM | POA: Diagnosis present

## 2024-01-17 DIAGNOSIS — J9601 Acute respiratory failure with hypoxia: Secondary | ICD-10-CM | POA: Diagnosis not present

## 2024-01-17 DIAGNOSIS — J189 Pneumonia, unspecified organism: Secondary | ICD-10-CM | POA: Diagnosis not present

## 2024-01-17 DIAGNOSIS — Y95 Nosocomial condition: Secondary | ICD-10-CM | POA: Diagnosis present

## 2024-01-17 DIAGNOSIS — E441 Mild protein-calorie malnutrition: Secondary | ICD-10-CM | POA: Diagnosis present

## 2024-01-17 DIAGNOSIS — G610 Guillain-Barre syndrome: Secondary | ICD-10-CM | POA: Diagnosis present

## 2024-01-17 LAB — CBC WITH DIFFERENTIAL/PLATELET
Abs Immature Granulocytes: 0.06 K/uL (ref 0.00–0.07)
Basophils Absolute: 0 K/uL (ref 0.0–0.1)
Basophils Relative: 0 %
Eosinophils Absolute: 0 K/uL (ref 0.0–0.5)
Eosinophils Relative: 0 %
HCT: 36.2 % (ref 36.0–46.0)
Hemoglobin: 11.2 g/dL — ABNORMAL LOW (ref 12.0–15.0)
Immature Granulocytes: 1 %
Lymphocytes Relative: 18 %
Lymphs Abs: 2 K/uL (ref 0.7–4.0)
MCH: 28.1 pg (ref 26.0–34.0)
MCHC: 30.9 g/dL (ref 30.0–36.0)
MCV: 90.7 fL (ref 80.0–100.0)
Monocytes Absolute: 1 K/uL (ref 0.1–1.0)
Monocytes Relative: 9 %
Neutro Abs: 7.9 K/uL — ABNORMAL HIGH (ref 1.7–7.7)
Neutrophils Relative %: 72 %
Platelets: 244 K/uL (ref 150–400)
RBC: 3.99 MIL/uL (ref 3.87–5.11)
RDW: 17.7 % — ABNORMAL HIGH (ref 11.5–15.5)
WBC: 11 K/uL — ABNORMAL HIGH (ref 4.0–10.5)
nRBC: 0.4 % — ABNORMAL HIGH (ref 0.0–0.2)

## 2024-01-17 LAB — HCG, SERUM, QUALITATIVE: Preg, Serum: NEGATIVE

## 2024-01-17 LAB — BLOOD GAS, VENOUS
Acid-Base Excess: 9.2 mmol/L — ABNORMAL HIGH (ref 0.0–2.0)
Bicarbonate: 36.7 mmol/L — ABNORMAL HIGH (ref 20.0–28.0)
Drawn by: 7049
O2 Saturation: 55.2 %
Patient temperature: 36.9
pCO2, Ven: 62 mmHg — ABNORMAL HIGH (ref 44–60)
pH, Ven: 7.38 (ref 7.25–7.43)
pO2, Ven: 32 mmHg (ref 32–45)

## 2024-01-17 LAB — RESP PANEL BY RT-PCR (RSV, FLU A&B, COVID)  RVPGX2
Influenza A by PCR: NEGATIVE
Influenza B by PCR: NEGATIVE
Resp Syncytial Virus by PCR: NEGATIVE
SARS Coronavirus 2 by RT PCR: NEGATIVE

## 2024-01-17 LAB — COMPREHENSIVE METABOLIC PANEL WITH GFR
ALT: 22 U/L (ref 0–44)
AST: 37 U/L (ref 15–41)
Albumin: 3.1 g/dL — ABNORMAL LOW (ref 3.5–5.0)
Alkaline Phosphatase: 142 U/L — ABNORMAL HIGH (ref 38–126)
Anion gap: 7 (ref 5–15)
BUN: <5 mg/dL — ABNORMAL LOW (ref 6–20)
CO2: 30 mmol/L (ref 22–32)
Calcium: 8.4 mg/dL — ABNORMAL LOW (ref 8.9–10.3)
Chloride: 100 mmol/L (ref 98–111)
Creatinine, Ser: 0.46 mg/dL (ref 0.44–1.00)
GFR, Estimated: >60 mL/min (ref >60)
Glucose, Bld: 105 mg/dL — ABNORMAL HIGH (ref 70–99)
Potassium: 4.3 mmol/L (ref 3.5–5.1)
Sodium: 137 mmol/L (ref 135–145)
Total Bilirubin: 0.9 mg/dL (ref 0.0–1.2)
Total Protein: 6.4 g/dL — ABNORMAL LOW (ref 6.5–8.1)

## 2024-01-17 LAB — PRO BRAIN NATRIURETIC PEPTIDE: Pro Brain Natriuretic Peptide: 1109 pg/mL — ABNORMAL HIGH (ref <300.0)

## 2024-01-17 LAB — D-DIMER, QUANTITATIVE: D-Dimer, Quant: 0.58 ug{FEU}/mL — ABNORMAL HIGH (ref 0.00–0.50)

## 2024-01-17 LAB — CBG MONITORING, ED: Glucose-Capillary: 119 mg/dL — ABNORMAL HIGH (ref 70–99)

## 2024-01-17 MED ORDER — MORPHINE SULFATE (PF) 4 MG/ML IV SOLN
4.0000 mg | Freq: Once | INTRAVENOUS | Status: AC
  Administered 2024-01-17: 4 mg via INTRAVENOUS
  Filled 2024-01-17: qty 1

## 2024-01-17 MED ORDER — IOHEXOL 350 MG/ML SOLN
75.0000 mL | Freq: Once | INTRAVENOUS | Status: AC | PRN
  Administered 2024-01-17: 75 mL via INTRAVENOUS

## 2024-01-17 MED ORDER — ONDANSETRON HCL 4 MG/2ML IJ SOLN
4.0000 mg | Freq: Once | INTRAMUSCULAR | Status: AC
  Administered 2024-01-17: 4 mg via INTRAVENOUS
  Filled 2024-01-17: qty 2

## 2024-01-17 NOTE — ED Notes (Signed)
 Patient transported to CT

## 2024-01-17 NOTE — ED Provider Notes (Signed)
 [place] EMERGENCY DEPARTMENT AT [facility] Provider Note   CSN: [id number] Arrival date & time: 01/17/24  1702     History  Chief Complaint  Patient presents with   Leg Injury    Carla Little is a 33 y.o. 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  Patient reports a general feeling that something is wrong.  She also reports a headache and significant pain in her left knee. Went to OR on 01/14/24 for ACL repair.  She denies any fevers chills, chest pain, shortness of breath, or cough however she states that the facility earlier at [facility] noted that she was hypoxic into the 60s on room air.  This has never happened to her before and she does not wear oxygen at home.  She endorses some nausea with no vomiting.  Denies any urinary symptoms or diarrhea.  No numbness tingling in the leg but does note that it is swollen.   Past Medical History:  Diagnosis Date   Anxiety    Class 3 obesity (HCC) 12/09/2020   Depression    GERD (gastroesophageal reflux disease)    Guillain Barr syndrome    Nonalcoholic steatohepatitis (NASH) 12/09/2020   Obesity    PUD (peptic ulcer disease) 12/09/2020       Home Medications Prior to Admission medications   Medication Sig Start Date End Date Taking? Authorizing Provider  acetaminophen  (TYLENOL ) 325 MG tablet Take 2 tablets (650 mg total) by mouth every 6 (six) hours as needed for mild pain (or Fever >/= 101). 01/26/21   Jens Durand, MD  amoxicillin-clavulanate (AUGMENTIN) 875-125 MG tablet Take 1 tablet by mouth 2 (two) times daily. 12/31/23   [provider]  APPLE CIDER VINEGAR PO Take 2 each by mouth daily. [employer]    [provider]  ascorbic acid  (VITAMIN C) 500 MG tablet Take 500 mg by mouth daily.    [provider]  azithromycin  (ZITHROMAX ) 250 MG tablet Take 250 mg by mouth as directed. 12/31/23    [provider]  buPROPion  (WELLBUTRIN  XL) 300 MG 24 hr tablet Take 300 mg by mouth daily. 12/23/23   [provider]  busPIRone  (BUSPAR ) 10 MG tablet Take 20 mg by mouth 3 (three) times daily.    [provider]  cholecalciferol  (VITAMIN D3) 25 MCG (1000 UNIT) tablet Take 2,000 Units by mouth daily.    [provider]  cyanocobalamin  (,VITAMIN B-12,) 1000 MCG/ML injection Inject 1 mL (1,000 mcg total) into the muscle every 30 (thirty) days. 01/29/21   Jens Durand, MD  doxepin (SINEQUAN) 25 MG capsule Take 25 mg by mouth at bedtime. 01/06/24   [provider]  fluticasone (FLONASE) 50 MCG/ACT nasal spray Place 1 spray into both nostrils daily. 12/26/23   [provider]  folic acid  (FOLVITE ) 1 MG tablet Take 1 tablet (1 mg total) by mouth daily. 01/27/21   Jens Durand, MD  hydrOXYzine  (ATARAX ) 25 MG tablet Take 25 mg by mouth at bedtime.    [provider]  ipratropium-albuterol  (DUONEB) 0.5-2.5 (3) MG/3ML SOLN Take 3 mLs by nebulization every 4 (four) hours as needed (wheezing, shortness of breath). 12/31/23   [provider]  lansoprazole (PREVACID) 30 MG capsule Take 30 mg by mouth daily.    [provider]  linaclotide  (LINZESS ) 290 MCG CAPS capsule Take 1 capsule (290 mcg total) by mouth daily before breakfast. 04/18/21  Antoinette Doe, MD  loperamide (IMODIUM) 2 MG capsule Take 2 mg by mouth every 6 (six) hours as needed for diarrhea or loose stools.    [provider]  Melatonin 5 MG CHEW Chew 10 mg by mouth at bedtime. [employer] Sleep Gummies    [provider]  methocarbamol  (ROBAXIN ) 500 MG tablet Take 1 tablet (500 mg total) by mouth every 6 (six) hours as needed for muscle spasms. 11/28/20   Leotis Bogus, MD  methylPREDNISolone  sodium succinate (SOLU-MEDROL ) 125 mg/2 mL injection Inject 125 mg into the muscle once. 12/31/23   [provider]  Multiple Vitamin (MULTIVITAMIN)  capsule Take 1 capsule by mouth daily.    [provider]  naloxone [facility]) nasal spray 4 mg/0.1 mL Place 1 spray into the nose once.    [provider]  Nutritional Supplements (BRAIN SUPPORT PO) Take 3 each by mouth daily. [employer] Gumm[employer]    [provider]  ondansetron  (ZOFRAN ) 4 MG tablet Take 4 mg by mouth every 8 (eight) hours as needed. 01/17/24   [provider]  Oxycodone  HCl 10 MG TABS Take 1 tablet (10 mg total) by mouth every 6 (six) hours as needed (pain). 01/16/24   Genelle Standing, MD  polyethylene glycol (MIRALAX  / GLYCOLAX ) 17 g packet Take 17 g by mouth 2 (two) times daily as needed for moderate constipation.    [provider]  predniSONE  (DELTASONE ) 5 MG tablet Take 5 mg by mouth daily. 01/10/24   [provider]  predniSONE  (DELTASONE ) 50 MG tablet Take 50 mg by mouth daily. 12/31/23   [provider]  pregabalin  (LYRICA ) 100 MG capsule Take 100 mg by mouth 3 (three) times daily. 12/30/23   [provider]  pregabalin  (LYRICA ) 25 MG capsule Take 25 mg by mouth 3 (three) times daily. 12/30/23   [provider]  senna-docusate (SENOKOT-S) 8.6-50 MG tablet Take 1 tablet by mouth at bedtime as needed for mild constipation. 01/26/21   Jens Durand, MD  Venlafaxine HCl 225 MG TB24 Take 1 tablet by mouth daily. 01/11/24   [provider]      Allergies    Azithromycin     Review of Systems   Review of Systems A 10 point review of systems was performed and is negative unless otherwise reported in HPI.  Physical Exam Updated Vital Signs BP 101/66   Pulse 98   Temp 98.6 F (37 C) (Oral)   Resp 16   Ht 5' 4 (1.626 m)   Wt (!) 158.8 kg   LMP 12/14/2023 (Approximate)   SpO2 99%   BMI 60.08 kg/m  Physical Exam General: Morbidly obese female, lying in bed.  HEENT: PERRLA, Sclera anicteric, MMM, trachea midline.  Cardiology: Regular tachycardic rate/rhythm, no  murmurs/rubs/gallops.  Resp: Normal respiratory rate and effort. CTAB, no wheezes, rhonchi, crackles.  Abd: Soft, non-tender, non-distended. No rebound tenderness or guarding.  GU: Deferred. MSK: Mild swelling noted in the left knee with well-healing surgical incisions that do have mild erythema around them but no purulent drainage or induration.  Mild peripheral edema on the left. Intact distal pulses.  Skin: warm, dry.  Neuro: A&Ox4, CNs II-XII grossly intact. MAEs. Sensation grossly intact.  Psych: Normal mood and affect.   ED Results / Procedures / Treatments   Labs (all labs ordered are listed, but only abnormal results are displayed) Labs Reviewed  COMPREHENSIVE METABOLIC PANEL WITH GFR - Abnormal; Notable for the following components:  Result Value   Glucose, Bld 105 (*)    BUN <5 (*)    Calcium  8.4 (*)    Total Protein 6.4 (*)    Albumin  3.1 (*)    Alkaline Phosphatase 142 (*)    All other components within normal limits  PRO BRAIN NATRIURETIC PEPTIDE - Abnormal; Notable for the following components:   Pro Brain Natriuretic Peptide 1,109.0 (*)    All other components within normal limits  BLOOD GAS, VENOUS - Abnormal; Notable for the following components:   pCO2, Ven 62 (*)    Bicarbonate 36.7 (*)    Acid-Base Excess 9.2 (*)    All other components within normal limits  CBC WITH DIFFERENTIAL/PLATELET - Abnormal; Notable for the following components:   WBC 11.0 (*)    Hemoglobin 11.2 (*)    RDW 17.7 (*)    nRBC 0.4 (*)    Neutro Abs 7.9 (*)    All other components within normal limits  D-DIMER, QUANTITATIVE (NOT AT [facility]) - Abnormal; Notable for the following components:   D-Dimer, Quant 0.58 (*)    All other components within normal limits  CBG MONITORING, ED - Abnormal; Notable for the following components:   Glucose-Capillary 119 (*)    All other components within normal limits  RESP PANEL BY RT-PCR (RSV, FLU A&B, COVID)  RVPGX2  HCG, SERUM, QUALITATIVE  CBC  WITH DIFFERENTIAL/PLATELET  URINALYSIS, W/ REFLEX TO CULTURE (INFECTION SUSPECTED)    EKG EKG Interpretation Date/Time:  Friday January 17 2024 18:19:17 EDT Ventricular Rate:  96 PR Interval:  130 QRS Duration:  88 QT Interval:  345 QTC Calculation: 436 R Axis:   27  Text Interpretation: Sinus rhythm Low voltage, precordial leads Confirmed by Franklyn Gills [phone]) on 01/17/2024 6:32:45 PM  Radiology CT Angio Chest PE W and/or Wo Contrast Result Date: 01/17/2024 EXAM: CTA of the Chest with contrast for PE 01/17/2024 11:05:19 PM TECHNIQUE: CTA of the chest was performed after the administration of intravenous contrast. Multiplanar reformatted images are provided for review. MIP images are provided for review. Automated exposure control, iterative reconstruction, and/or weight based adjustment of the mA/kV was utilized to reduce the radiation dose to as low as reasonably achievable. COMPARISON: CT angiogram chest 02/10/2022. CLINICAL HISTORY: Pulmonary embolism (PE) suspected, high prob. pt states that she is here for low O2sat, nausea and vomiting and a headache. Recent knee surgery; Scan delayed due to IV issues. FINDINGS: PULMONARY ARTERIES: Pulmonary arteries are adequately opacified for evaluation. No pulmonary embolism. Main pulmonary artery is normal in caliber. MEDIASTINUM: The heart and pericardium demonstrate no acute abnormality. There is no acute abnormality of the thoracic aorta. LYMPH NODES: No mediastinal, hilar or axillary lymphadenopathy. LUNGS AND PLEURA: There are mild patchy multifocal airspacing ground-glass opacities in the bilateral upper lobes and bilateral lower lobes, right greater than left. No pleural effusion or pneumothorax. UPPER ABDOMEN: Limited images of the upper abdomen are unremarkable. SOFT TISSUES AND BONES: No acute bone or soft tissue abnormality. IMPRESSION: 1. No pulmonary embolism. 2. Mild patchy multifocal ground-glass opacities in the bilateral upper and  lower lobes, right greater than left, compatible with an infectious or inflammatory process. Correlate clinically; consider short-interval chest CT follow-up if symptoms persist or worsen. Electronically signed by: Greig Pique MD 01/17/2024 11:19 PM EDT RP Workstation: [id number]   DG Chest Portable 1 View Result Date: 01/17/2024 CLINICAL DATA:  Hypoxia with nausea, vomiting and headache. EXAM: PORTABLE CHEST 1 VIEW COMPARISON:  April 12, 2021 FINDINGS: The cardiac  silhouette is mildly enlarged and unchanged in size. Low lung volumes are noted with mild, stable elevation of the right hemidiaphragm. There is prominence of the central pulmonary vasculature. No acute infiltrate, pleural effusion or pneumothorax is identified. The visualized skeletal structures are unremarkable. IMPRESSION: Low lung volumes with mild cardiomegaly and mild pulmonary vascular congestion. Electronically Signed   By: Suzen Dials M.D.   On: 01/17/2024 18:17    Procedures Procedures    Medications Ordered in ED Medications  morphine  (PF) 4 MG/ML injection 4 mg (4 mg Intravenous Given 01/17/24 1902)  ondansetron  (ZOFRAN ) injection 4 mg (4 mg Intravenous Given 01/17/24 1902)  iohexol  (OMNIPAQUE ) 350 MG/ML injection 75 mL (75 mLs Intravenous Contrast Given 01/17/24 2246)  morphine  (PF) 4 MG/ML injection 4 mg (4 mg Intravenous Given 01/17/24 2313)  ondansetron  (ZOFRAN ) injection 4 mg (4 mg Intravenous Given 01/17/24 2312)    ED Course/ Medical Decision Making/ A&P                          Medical Decision Making Amount and/or Complexity of Data Reviewed Labs: ordered. Decision-making details documented in ED Course. Radiology: ordered. Decision-making details documented in ED Course.  Risk Prescription drug management. Decision regarding hospitalization.    This patient presents to the ED for concern of acute epoxy respiratory failure, left knee pain, this involves an extensive number of treatment  options, and is a complaint that carries with it a high risk of complications and morbidity.  I considered the following differential and admission for this acute, potentially life threatening condition.   MDM:    High degree of concern for pulmonary embolism in this patient with a new acute hypoxic respite failure so recently after an ACL repair on 01/14/2024.  She is also very sedentary and is essentially bedbound at the facility.  She presents tachycardic and intermittently mildly tachypneic.  During transfer of the oxygen from the portable unit to the wall unit patient desatted to 87%.  She is on 3 L O2.  She does not have any respiratory distress or chest pain.  EKG with sinus rhythm.  Chest x-ray does demonstrate some mild cardiomegaly and pulmonary vascular congestion raising concern for possible heart failure.  BNP elevated to 1,109. She has no significant hypercarbic resp failure on VBG. pCO2 is 62. She has neg viral panel but still could consider viral resp infection.  She has a mild leukocytosis but no significant electrolyte derangements or renal injury.  She does have a mildly elevated D-dimer 0.58 and her left lower extremity is erythematous around her surgical incisions -no purulence, lower concern for surgical site infection -but cannot rule out left lower extremity DVT so ultrasound is ordered.  Patient will need a CT PE to rule out pulmonary embolism  Clinical Course as of 01/17/24 2331  Fri Jan 17, 2024  1832 DG Chest Portable 1 View Low lung volumes with mild cardiomegaly and mild pulmonary vascular congestion.   [HN]  1955 WBC(!): 11.0 Mild leukocytosis [HN]    Clinical Course User Index [HN] Franklyn Sid SAILOR, MD    Labs: I Ordered, and personally interpreted labs.  The pertinent results include: Those listed above  Imaging Studies ordered: I ordered imaging studies including chest x-ray, CT PE I independently visualized and interpreted imaging. I agree with the  radiologist interpretation  Additional history obtained from chart review.   Cardiac Monitoring: The patient was maintained on a cardiac monitor.  I personally viewed  and interpreted the cardiac monitored which showed an underlying rhythm of: Normal sinus rhythm/sinus tachycardia  Reevaluation: After the interventions noted above, I reevaluated the patient and found that they have :stayed the same  Social Determinants of Health:  lives at SNF  Disposition:  Patient is signed out to the oncoming ED physician Dr. Theadore who is made aware of her history, presentation, exam, workup, and plan.    Co morbidities that complicate the patient evaluation  Past Medical History:  Diagnosis Date   Anxiety    Class 3 obesity (HCC) 12/09/2020   Depression    GERD (gastroesophageal reflux disease)    Guillain Barr syndrome    Nonalcoholic steatohepatitis (NASH) 12/09/2020   Obesity    PUD (peptic ulcer disease) 12/09/2020     Medicines Meds ordered this encounter  Medications   morphine  (PF) 4 MG/ML injection 4 mg    Refill:  0   ondansetron  (ZOFRAN ) injection 4 mg   iohexol  (OMNIPAQUE ) 350 MG/ML injection 75 mL   morphine  (PF) 4 MG/ML injection 4 mg    Refill:  0   ondansetron  (ZOFRAN ) injection 4 mg    I have reviewed the patients home medicines and have made adjustments as needed  Problem List / ED Course: Problem List Items Addressed This Visit   None Visit Diagnoses       Acute hypoxic respiratory failure (HCC)    -  Primary                   This note was created using dictation software, which may contain spelling or grammatical errors.    Franklyn Sid SAILOR, MD 01/17/24 [phone]

## 2024-01-17 NOTE — ED Provider Notes (Signed)
  Provider Note MRN:  [id number]  Arrival date & time: 01/18/24    ED Course and Medical Decision Making  Assumed care of patient at sign-out or upon transfer.  Malaise and hypoxia, recent ACL repair awaiting PE study.  New oxygen requirement will need admission.  12 AM update: Patient comfortable on 3 L nasal cannula, accepted for admission.  .Critical Care  Performed by: Theadore Ozell HERO, MD Authorized by: Theadore Ozell HERO, MD   Critical care provider statement:    Critical care time (minutes):  34   Critical care was necessary to treat or prevent imminent or life-threatening deterioration of the following conditions:  Respiratory failure   Critical care was time spent personally by me on the following activities:  Development of treatment plan with patient or surrogate, discussions with consultants, evaluation of patient's response to treatment, examination of patient, ordering and review of laboratory studies, ordering and review of radiographic studies, ordering and performing treatments and interventions, pulse oximetry, re-evaluation of patient's condition and review of old charts   Final Clinical Impressions(s) / ED Diagnoses     ICD-10-CM   1. Acute hypoxic respiratory failure [facility])  J96.01       ED Discharge Orders     None       Discharge Instructions   None     Ozell HERO. Theadore, MD [facility] Emergency Medicine [facility] [email or web address]    Theadore Ozell HERO, MD 01/18/24 STANLY

## 2024-01-17 NOTE — ED Triage Notes (Signed)
 Pt to er via ems, pt states that she is here for low O2sat, nausea and vomiting and a headache.

## 2024-01-18 ENCOUNTER — Inpatient Hospital Stay (HOSPITAL_COMMUNITY)

## 2024-01-18 ENCOUNTER — Other Ambulatory Visit (HOSPITAL_COMMUNITY): Payer: Self-pay | Admitting: *Deleted

## 2024-01-18 DIAGNOSIS — G610 Guillain-Barre syndrome: Secondary | ICD-10-CM | POA: Diagnosis present

## 2024-01-18 DIAGNOSIS — Z8249 Family history of ischemic heart disease and other diseases of the circulatory system: Secondary | ICD-10-CM | POA: Diagnosis not present

## 2024-01-18 DIAGNOSIS — M25562 Pain in left knee: Secondary | ICD-10-CM

## 2024-01-18 DIAGNOSIS — K7581 Nonalcoholic steatohepatitis (NASH): Secondary | ICD-10-CM | POA: Diagnosis present

## 2024-01-18 DIAGNOSIS — Z8711 Personal history of peptic ulcer disease: Secondary | ICD-10-CM | POA: Diagnosis not present

## 2024-01-18 DIAGNOSIS — J9621 Acute and chronic respiratory failure with hypoxia: Secondary | ICD-10-CM | POA: Diagnosis present

## 2024-01-18 DIAGNOSIS — R7989 Other specified abnormal findings of blood chemistry: Secondary | ICD-10-CM | POA: Insufficient documentation

## 2024-01-18 DIAGNOSIS — Z7952 Long term (current) use of systemic steroids: Secondary | ICD-10-CM | POA: Diagnosis not present

## 2024-01-18 DIAGNOSIS — E441 Mild protein-calorie malnutrition: Secondary | ICD-10-CM | POA: Diagnosis present

## 2024-01-18 DIAGNOSIS — E66813 Obesity, class 3: Secondary | ICD-10-CM | POA: Diagnosis present

## 2024-01-18 DIAGNOSIS — E662 Morbid (severe) obesity with alveolar hypoventilation: Secondary | ICD-10-CM | POA: Diagnosis present

## 2024-01-18 DIAGNOSIS — I5021 Acute systolic (congestive) heart failure: Secondary | ICD-10-CM

## 2024-01-18 DIAGNOSIS — Z1152 Encounter for screening for COVID-19: Secondary | ICD-10-CM | POA: Diagnosis not present

## 2024-01-18 DIAGNOSIS — G6181 Chronic inflammatory demyelinating polyneuritis: Secondary | ICD-10-CM | POA: Diagnosis present

## 2024-01-18 DIAGNOSIS — F419 Anxiety disorder, unspecified: Secondary | ICD-10-CM | POA: Diagnosis present

## 2024-01-18 DIAGNOSIS — E8809 Other disorders of plasma-protein metabolism, not elsewhere classified: Secondary | ICD-10-CM | POA: Insufficient documentation

## 2024-01-18 DIAGNOSIS — K2100 Gastro-esophageal reflux disease with esophagitis, without bleeding: Secondary | ICD-10-CM | POA: Diagnosis not present

## 2024-01-18 DIAGNOSIS — K219 Gastro-esophageal reflux disease without esophagitis: Secondary | ICD-10-CM | POA: Diagnosis present

## 2024-01-18 DIAGNOSIS — E46 Unspecified protein-calorie malnutrition: Secondary | ICD-10-CM

## 2024-01-18 DIAGNOSIS — J189 Pneumonia, unspecified organism: Secondary | ICD-10-CM | POA: Diagnosis present

## 2024-01-18 DIAGNOSIS — Z6841 Body Mass Index (BMI) 40.0 and over, adult: Secondary | ICD-10-CM | POA: Diagnosis not present

## 2024-01-18 DIAGNOSIS — J9601 Acute respiratory failure with hypoxia: Secondary | ICD-10-CM | POA: Diagnosis present

## 2024-01-18 DIAGNOSIS — Z881 Allergy status to other antibiotic agents status: Secondary | ICD-10-CM | POA: Diagnosis not present

## 2024-01-18 DIAGNOSIS — R000 Tachycardia, unspecified: Secondary | ICD-10-CM | POA: Diagnosis present

## 2024-01-18 DIAGNOSIS — Z9889 Other specified postprocedural states: Secondary | ICD-10-CM | POA: Diagnosis not present

## 2024-01-18 DIAGNOSIS — Z79899 Other long term (current) drug therapy: Secondary | ICD-10-CM | POA: Diagnosis not present

## 2024-01-18 DIAGNOSIS — Y95 Nosocomial condition: Secondary | ICD-10-CM | POA: Diagnosis present

## 2024-01-18 DIAGNOSIS — F32A Depression, unspecified: Secondary | ICD-10-CM | POA: Diagnosis present

## 2024-01-18 DIAGNOSIS — Z800 Family history of malignant neoplasm of digestive organs: Secondary | ICD-10-CM | POA: Diagnosis not present

## 2024-01-18 LAB — COMPREHENSIVE METABOLIC PANEL WITH GFR
ALT: 23 U/L (ref 0–44)
AST: 25 U/L (ref 15–41)
Albumin: 3.4 g/dL — ABNORMAL LOW (ref 3.5–5.0)
Alkaline Phosphatase: 146 U/L — ABNORMAL HIGH (ref 38–126)
Anion gap: 9 (ref 5–15)
BUN: <5 mg/dL — ABNORMAL LOW (ref 6–20)
CO2: 31 mmol/L (ref 22–32)
Calcium: 8.7 mg/dL — ABNORMAL LOW (ref 8.9–10.3)
Chloride: 100 mmol/L (ref 98–111)
Creatinine, Ser: 0.53 mg/dL (ref 0.44–1.00)
GFR, Estimated: >60 mL/min (ref >60)
Glucose, Bld: 132 mg/dL — ABNORMAL HIGH (ref 70–99)
Potassium: 3.6 mmol/L (ref 3.5–5.1)
Sodium: 141 mmol/L (ref 135–145)
Total Bilirubin: 0.9 mg/dL (ref 0.0–1.2)
Total Protein: 6.9 g/dL (ref 6.5–8.1)

## 2024-01-18 LAB — CBC
HCT: 38.9 % (ref 36.0–46.0)
Hemoglobin: 11.9 g/dL — ABNORMAL LOW (ref 12.0–15.0)
MCH: 27.9 pg (ref 26.0–34.0)
MCHC: 30.6 g/dL (ref 30.0–36.0)
MCV: 91.1 fL (ref 80.0–100.0)
Platelets: 379 K/uL (ref 150–400)
RBC: 4.27 MIL/uL (ref 3.87–5.11)
RDW: 17.9 % — ABNORMAL HIGH (ref 11.5–15.5)
WBC: 12.3 K/uL — ABNORMAL HIGH (ref 4.0–10.5)
nRBC: 0.4 % — ABNORMAL HIGH (ref 0.0–0.2)

## 2024-01-18 LAB — ECHOCARDIOGRAM COMPLETE
Area-P 1/2: 3.27 cm2
Height: 64 in
S' Lateral: 2.8 cm
Weight: 5600 [oz_av]

## 2024-01-18 LAB — PROCALCITONIN: Procalcitonin: <0.1 ng/mL

## 2024-01-18 LAB — HIV ANTIBODY (ROUTINE TESTING W REFLEX): HIV Screen 4th Generation wRfx: NONREACTIVE

## 2024-01-18 LAB — PHOSPHORUS: Phosphorus: 2.4 mg/dL — ABNORMAL LOW (ref 2.5–4.6)

## 2024-01-18 LAB — MAGNESIUM: Magnesium: 2.2 mg/dL (ref 1.7–2.4)

## 2024-01-18 MED ORDER — ENOXAPARIN SODIUM 80 MG/0.8ML IJ SOSY
80.0000 mg | PREFILLED_SYRINGE | INTRAMUSCULAR | Status: DC
  Administered 2024-01-18 – 2024-01-20 (×3): 80 mg via SUBCUTANEOUS
  Filled 2024-01-18 (×3): qty 0.8

## 2024-01-18 MED ORDER — FLUTICASONE PROPIONATE 50 MCG/ACT NA SUSP
1.0000 | Freq: Every day | NASAL | Status: DC
  Filled 2024-01-18: qty 16

## 2024-01-18 MED ORDER — SODIUM CHLORIDE 0.9 % IV SOLN
100.0000 mg | Freq: Two times a day (BID) | INTRAVENOUS | Status: DC
  Administered 2024-01-18 – 2024-01-20 (×5): 100 mg via INTRAVENOUS
  Filled 2024-01-18 (×8): qty 100

## 2024-01-18 MED ORDER — POLYETHYLENE GLYCOL 3350 17 G PO PACK: 17.0000 g | PACK | Freq: Two times a day (BID) | ORAL | Status: DC | PRN

## 2024-01-18 MED ORDER — OXYCODONE HCL 5 MG PO TABS
10.0000 mg | ORAL_TABLET | Freq: Four times a day (QID) | ORAL | Status: DC | PRN
  Administered 2024-01-18 – 2024-01-20 (×6): 10 mg via ORAL
  Filled 2024-01-18 (×5): qty 2

## 2024-01-18 MED ORDER — PIPERACILLIN-TAZOBACTAM 3.375 G IVPB
3.3750 g | Freq: Three times a day (TID) | INTRAVENOUS | Status: DC
  Administered 2024-01-18 – 2024-01-20 (×4): 3.375 g via INTRAVENOUS
  Filled 2024-01-18 (×4): qty 50

## 2024-01-18 MED ORDER — ENSURE PLUS HIGH PROTEIN PO LIQD
237.0000 mL | Freq: Two times a day (BID) | ORAL | Status: DC
  Administered 2024-01-18 – 2024-01-20 (×4): 237 mL via ORAL

## 2024-01-18 MED ORDER — PANTOPRAZOLE SODIUM 40 MG PO TBEC
40.0000 mg | DELAYED_RELEASE_TABLET | Freq: Every day | ORAL | Status: DC
Start: 2024-01-18 — End: 2024-01-20
  Administered 2024-01-18 – 2024-01-20 (×3): 40 mg via ORAL
  Filled 2024-01-18 (×3): qty 1

## 2024-01-18 MED ORDER — BUPROPION HCL ER (XL) 300 MG PO TB24
300.0000 mg | ORAL_TABLET | Freq: Every day | ORAL | Status: DC
  Administered 2024-01-20: 300 mg via ORAL
  Filled 2024-01-18: qty 1

## 2024-01-18 MED ORDER — PIPERACILLIN-TAZOBACTAM 3.375 G IVPB 30 MIN
3.3750 g | Freq: Once | INTRAVENOUS | Status: AC
  Administered 2024-01-18: 3.375 g via INTRAVENOUS
  Filled 2024-01-18: qty 50

## 2024-01-18 MED ORDER — DICLOFENAC SODIUM 1 % EX GEL: 4.0000 g | Freq: Four times a day (QID) | CUTANEOUS | Status: DC | PRN

## 2024-01-18 MED ORDER — KETOROLAC TROMETHAMINE 15 MG/ML IJ SOLN
15.0000 mg | Freq: Once | INTRAMUSCULAR | Status: AC
  Administered 2024-01-18: 15 mg via INTRAVENOUS
  Filled 2024-01-18: qty 1

## 2024-01-18 MED ORDER — DM-GUAIFENESIN ER 30-600 MG PO TB12
1.0000 | ORAL_TABLET | Freq: Two times a day (BID) | ORAL | Status: DC
  Administered 2024-01-18 – 2024-01-20 (×5): 1 via ORAL
  Filled 2024-01-18 (×5): qty 1

## 2024-01-18 MED ORDER — SODIUM CHLORIDE 0.9 % IV SOLN
2.0000 g | Freq: Once | INTRAVENOUS | Status: AC
  Administered 2024-01-18: 2 g via INTRAVENOUS
  Filled 2024-01-18: qty 20

## 2024-01-18 MED ORDER — OXYCODONE HCL 5 MG PO TABS
10.0000 mg | ORAL_TABLET | Freq: Four times a day (QID) | ORAL | Status: DC | PRN
Start: 2024-01-18 — End: 2024-01-18
  Filled 2024-01-18: qty 2

## 2024-01-18 MED ORDER — LINACLOTIDE 145 MCG PO CAPS
290.0000 ug | ORAL_CAPSULE | Freq: Every day | ORAL | Status: DC
  Administered 2024-01-19 – 2024-01-20 (×2): 290 ug via ORAL
  Filled 2024-01-18 (×2): qty 2

## 2024-01-18 MED ORDER — HYDROXYZINE HCL 25 MG PO TABS
25.0000 mg | ORAL_TABLET | Freq: Every day | ORAL | Status: DC
  Administered 2024-01-18 – 2024-01-19 (×2): 25 mg via ORAL
  Filled 2024-01-18 (×2): qty 1

## 2024-01-18 MED ORDER — PREGABALIN 25 MG PO CAPS
25.0000 mg | ORAL_CAPSULE | Freq: Three times a day (TID) | ORAL | Status: DC
  Administered 2024-01-18 – 2024-01-20 (×6): 25 mg via ORAL
  Filled 2024-01-18 (×6): qty 1

## 2024-01-18 MED ORDER — DOXEPIN HCL 25 MG PO CAPS
25.0000 mg | ORAL_CAPSULE | Freq: Every day | ORAL | Status: DC
  Administered 2024-01-18 – 2024-01-19 (×2): 25 mg via ORAL
  Filled 2024-01-18 (×2): qty 1

## 2024-01-18 MED ORDER — VENLAFAXINE HCL ER 75 MG PO CP24
225.0000 mg | ORAL_CAPSULE | Freq: Every day | ORAL | Status: DC
  Administered 2024-01-18 – 2024-01-20 (×3): 225 mg via ORAL
  Filled 2024-01-18 (×3): qty 3

## 2024-01-18 MED ORDER — PREGABALIN 50 MG PO CAPS
100.0000 mg | ORAL_CAPSULE | Freq: Three times a day (TID) | ORAL | Status: DC
  Administered 2024-01-18 – 2024-01-20 (×6): 100 mg via ORAL
  Filled 2024-01-18 (×5): qty 2
  Filled 2024-01-18: qty 4
  Filled 2024-01-18: qty 2

## 2024-01-18 MED ORDER — NALOXONE HCL 0.4 MG/ML IJ SOLN: 0.4000 mg | INTRAMUSCULAR | Status: DC | PRN

## 2024-01-18 MED ORDER — BUSPIRONE HCL 5 MG PO TABS
20.0000 mg | ORAL_TABLET | Freq: Three times a day (TID) | ORAL | Status: DC
  Administered 2024-01-18 – 2024-01-20 (×6): 20 mg via ORAL
  Filled 2024-01-18 (×6): qty 1

## 2024-01-18 MED ORDER — ONDANSETRON HCL 4 MG PO TABS: 4.0000 mg | ORAL_TABLET | Freq: Four times a day (QID) | ORAL | Status: DC | PRN

## 2024-01-18 MED ORDER — SODIUM CHLORIDE 0.9 % IV SOLN: 1.0000 g | INTRAVENOUS | Status: DC

## 2024-01-18 MED ORDER — ACETAMINOPHEN 500 MG PO TABS
1000.0000 mg | ORAL_TABLET | Freq: Four times a day (QID) | ORAL | Status: DC | PRN
  Administered 2024-01-18: 1000 mg via ORAL

## 2024-01-18 MED ORDER — ACETAMINOPHEN 500 MG PO TABS
1000.0000 mg | ORAL_TABLET | Freq: Four times a day (QID) | ORAL | Status: DC | PRN
  Filled 2024-01-18: qty 2

## 2024-01-18 MED ORDER — SENNOSIDES-DOCUSATE SODIUM 8.6-50 MG PO TABS
1.0000 | ORAL_TABLET | Freq: Every day | ORAL | Status: DC
  Administered 2024-01-18 – 2024-01-19 (×2): 1 via ORAL
  Filled 2024-01-18 (×2): qty 1

## 2024-01-18 MED ORDER — ONDANSETRON HCL 4 MG/2ML IJ SOLN
4.0000 mg | Freq: Four times a day (QID) | INTRAMUSCULAR | Status: DC | PRN
  Administered 2024-01-19: 4 mg via INTRAVENOUS
  Filled 2024-01-18: qty 2

## 2024-01-18 NOTE — Progress Notes (Signed)
 Patient arrived to room 313 from ED.  Transferred into a bariatric bed.  Assessment complete, VS obtained, and Admission database began.

## 2024-01-18 NOTE — Progress Notes (Signed)
*  PRELIMINARY RESULTS* Echocardiogram 2D Echocardiogram has been performed.  Carla [name] 01/18/2024, 2:58 PM

## 2024-01-18 NOTE — ED Notes (Signed)
 [facility] updated on plan of care

## 2024-01-18 NOTE — Plan of Care (Signed)

## 2024-01-18 NOTE — H&P (Addendum)
 History and Physical    Carla [name]: Carla Carla [name] FMW:[id number] DOB: 15-Sep-1990 DOA: 01/17/2024 DOS: the Carla [name] was seen and examined on 01/18/2024 PCP: Carla [name], No Pcp Per  Carla [name] coming from: [facility] Chief Complaint:  Chief Complaint  Carla [name] presents with   Leg Injury   HPI: Carla Carla [name] is a 33 y.o. female with medical history significant of GERD, NASH, CIDP, depression, morbid obesity who presents to the emergency department via EMS from [facility] due to left knee complaint, headache and sensation of not feeling well.  Carla [name] states that facility staff noted that her oxygen level dropped into the 60s on room air prior to arrival to the ED.  She endorsed ACL repair of her left knee on 01/14/2024.  She endorsed having nausea without vomiting and denies chest pain, numbness or tingling in the leg, shortness of breath, cough,  ED COURSE: In the emergency department, she was tachycardic with HR of 111 bpm, but all other vital signs were within normal range.  Workup in the ED showed normal CBC except for WBC of 11.0, hemoglobin 11.2.  BMP was normal except for blood glucose of 105, albumin  3.1, D-dimer 0.58, proBNP 1,109, pregnancy test was negative.  Influenza A, B, SARS, Carla [name], RSV was negative. CT angiography of chest showed no pulmonary embolism but showed mild  patchy multifocal ground-glass opacities in the bilateral upper and lower lobes, right greater than left, compatible with an infectious or inflammatory process.  Chest x-ray showed mild cardiomegaly and mild pulmonary vascular congestion She was treated with IV ceftriaxone , pain medication was given due to knee pain, Zofran  was provided.    Review of Systems: As mentioned in the history of present illness. All other systems reviewed and are negative. Past Medical History:  Diagnosis Date   Anxiety    Class 3 obesity (HCC) 12/09/2020   Depression    GERD (gastroesophageal reflux disease)    Guillain  Barr syndrome    Nonalcoholic steatohepatitis (NASH) 12/09/2020   Obesity    PUD (peptic ulcer disease) 12/09/2020   Past Surgical History:  Procedure Laterality Date   BIOPSY  11/19/2020   Procedure: BIOPSY;  Surgeon: Kristie Lamprey, MD;  Location: [facility];  Service: Endoscopy;;   ESOPHAGOGASTRODUODENOSCOPY N/A 01/10/2021   normal   ESOPHAGOGASTRODUODENOSCOPY (EGD) WITH PROPOFOL  N/A 11/19/2020   LA Grade B reflux esophagitis, one small non-bleeding gastric ulcer in antrum s/p biopsy. Negative h.pylori.   FLEXIBLE SIGMOIDOSCOPY N/A 01/10/2021   Colonoscopy attempted Oct 2022 by Dr. Elicia but prep was poor. Stool in rectum and rectosigmoid colon.   Social History:  reports that she has never smoked. She has never been exposed to tobacco smoke. She has never used smokeless tobacco. She reports that she does not currently use alcohol. She reports that she does not use drugs.  Allergies  Allergen Reactions   Azithromycin  Shortness Of Breath and Nausea And Vomiting    Family History  Problem Relation Age of Onset   Hypertension Other    Colon cancer Maternal Aunt    Colon polyps Neg Hx     Prior to Admission medications   Medication Sig Start Date End Date Taking? Authorizing Provider  acetaminophen  (TYLENOL ) 325 MG tablet Take 2 tablets (650 mg total) by mouth every 6 (six) hours as needed for mild pain (or Fever >/= 101). 01/26/21   Jens Durand, MD  amoxicillin-clavulanate (AUGMENTIN) 875-125 MG tablet Take 1 tablet by mouth 2 (two) times daily. 12/31/23   [provider]  APPLE CIDER VINEGAR PO Take 2 each by mouth daily. [employer]    [provider]  ascorbic acid  (VITAMIN C) 500 MG tablet Take 500 mg by mouth daily.    [provider]  azithromycin  (ZITHROMAX ) 250 MG tablet Take 250 mg by mouth as directed. 12/31/23   [provider]  buPROPion  (WELLBUTRIN  XL) 300 MG 24 hr tablet Take 300 mg by mouth daily.  12/23/23   [provider]  busPIRone  (BUSPAR ) 10 MG tablet Take 20 mg by mouth 3 (three) times daily.    [provider]  cholecalciferol  (VITAMIN D3) 25 MCG (1000 UNIT) tablet Take 2,000 Units by mouth daily.    [provider]  cyanocobalamin  (,VITAMIN B-12,) 1000 MCG/ML injection Inject 1 mL (1,000 mcg total) into the muscle every 30 (thirty) days. 01/29/21   Jens Durand, MD  doxepin (SINEQUAN) 25 MG capsule Take 25 mg by mouth at bedtime. 01/06/24   [provider]  fluticasone (FLONASE) 50 MCG/ACT nasal spray Place 1 spray into both nostrils daily. 12/26/23   [provider]  folic acid  (FOLVITE ) 1 MG tablet Take 1 tablet (1 mg total) by mouth daily. 01/27/21   Jens Durand, MD  hydrOXYzine  (ATARAX ) 25 MG tablet Take 25 mg by mouth at bedtime.    [provider]  ipratropium-albuterol  (DUONEB) 0.5-2.5 (3) MG/3ML SOLN Take 3 mLs by nebulization every 4 (four) hours as needed (wheezing, shortness of breath). 12/31/23   [provider]  lansoprazole (PREVACID) 30 MG capsule Take 30 mg by mouth daily.    [provider]  linaclotide  (LINZESS ) 290 MCG CAPS capsule Take 1 capsule (290 mcg total) by mouth daily before breakfast. 04/18/21   Antoinette Doe, MD  loperamide (IMODIUM) 2 MG capsule Take 2 mg by mouth every 6 (six) hours as needed for diarrhea or loose stools.    [provider]  Melatonin 5 MG CHEW Chew 10 mg by mouth at bedtime. [employer]umm[employer]    [provider]  methocarbamol  (ROBAXIN ) 500 MG tablet Take 1 tablet (500 mg total) by mouth every 6 (six) hours as needed for muscle spasms. 11/28/20   Leotis Bogus, MD  methylPREDNISolone  sodium succinate (SOLU-MEDROL ) 125 mg/2 mL injection Inject 125 mg into the muscle once. 12/31/23   [provider]  Multiple Vitamin (MULTIVITAMIN) capsule Take 1 capsule by mouth daily.    [provider]  naloxone [facility]) nasal spray 4  mg/0.1 mL Place 1 spray into the nose once.    [provider]  Nutritional Supplements (BRAIN SUPPORT PO) Take 3 each by mouth daily. [employer]    [provider]  ondansetron  (ZOFRAN ) 4 MG tablet Take 4 mg by mouth every 8 (eight) hours as needed. 01/17/24   [provider]  Oxycodone  HCl 10 MG TABS Take 1 tablet (10 mg total) by mouth every 6 (six) hours as needed (pain). 01/16/24   Genelle Standing, MD  polyethylene glycol (MIRALAX  / GLYCOLAX ) 17 g packet Take 17 g by mouth 2 (two) times daily as needed for moderate constipation.    [provider]  predniSONE  (DELTASONE ) 5 MG tablet Take 5 mg by mouth daily. 01/10/24   [provider]  predniSONE  (DELTASONE ) 50 MG tablet Take 50 mg by mouth daily. 12/31/23   [provider]  pregabalin  (LYRICA ) 100 MG capsule Take 100 mg by mouth 3 (three) times daily. 12/30/23   [provider]  pregabalin  (LYRICA ) 25 MG capsule Take 25 mg by mouth 3 (three) times daily. 12/30/23   [provider]  senna-docusate (SENOKOT-S) 8.6-50 MG tablet Take 1 tablet by mouth at bedtime as needed for mild constipation. 01/26/21   Jens Durand, MD  Venlafaxine HCl 225 MG TB24 Take 1 tablet by mouth daily. 01/11/24   [provider]    Physical Exam: Vitals:   01/18/24 0022 01/18/24 0030 01/18/24 0100 01/18/24 0147  BP:  109/72 (!) 105/52 119/74  Pulse:  98 89 96  Resp:  19 19 18   Temp: 97.7 F (36.5 C)   98.4 F (36.9 C)  TempSrc: Oral   Oral  SpO2:  94% 97% 93%  Weight:      Height:       General:  Awake and alert and oriented x3. Not in any acute distress.  HEENT: NCAT.  PERRLA. EOMI. Sclerae anicteric.  Moist mucosal membranes. Neck: Neck supple without lymphadenopathy. No carotid bruits. No masses palpated.  Cardiovascular: Regular rate with normal S1-S2 sounds. No murmurs, rubs or gallops auscultated. No JVD.  Respiratory: Clear breath sounds.  No  accessory muscle use. Abdomen: Soft, nontender, nondistended. Active bowel sounds. No masses or hepatosplenomegaly  Skin: No rashes, lesions, or ulcerations.  Dry, warm to touch. Musculoskeletal: Noted healing surgical incision of left knee with mild erythema but without drainage.  2+ dorsalis pedis and radial pulses.   Psychiatric: Intact judgment and insight.  Mood appropriate to current condition. Neurologic: No focal neurological deficits. Strength is 5/5 x 4.  CN II - XII grossly intact.  Data Reviewed: Normal sinus rhythm at rate of 96 bpm  Assessment and Plan: Acute respiratory failure with hypoxia Carla [name] required supplemental oxygen via [place] at 3 LPM arrival to the ED (she does not use oxygen at baseline) Continue supplemental oxygen to maintain O2 sats > 92% with plan to wean Carla [name] off this as tolerated.  Elevated proBNP, r/o CHF proBNP 1,109 Chest x-ray showed mild pulmonary vascular congestion Continue total input/output, daily weights and fluid restriction IV Lasix  held at this time due to soft BP Continue heart healthy diet  Echocardiogram in the morning   Presumed CAP POA Carla [name] was started on ceftriaxone  and  we shall continue with ceftriaxone  and azithromycin  at this time with plan to de-escalate/discontinue based on blood culture, sputum culture, urine Legionella, strep pneumo and procalcitonin Continue Tylenol  as needed Continue Mucinex , incentive spirometry, flutter valve   Acute pain of left knee Carla [name] recently had ACL repair Continue oxycodone  5 mg every 4 hours as needed Continue fall precaution Consult PT/OT eval and treat  Hypoalbuminemia possibly secondary to mild protein calorie malnutrition Albumin  3.1, protein supplement will be provided  Elevated D-dimer D-dimer 0.58; CT angiography of chest showed no pulm embolism  GERD Continue PPI  Morbid obesity (BMI 60.08) Diet and lifestyle modification  Advance Care Planning: Full code  Consults:  None  Family Communication: None at bedside  Severity of Illness: The appropriate Carla [name] status for this Carla [name] is INPATIENT. Inpatient status is judged to be reasonable and necessary in order to provide the required intensity of service to ensure the Carla [name]'s safety. The Carla [name]'s presenting symptoms, physical exam findings, and initial radiographic and laboratory data in the context of their chronic comorbidities is felt to place them at high risk for further clinical deterioration. Furthermore, it is not anticipated that the Carla [name] will be medically stable for discharge from the hospital within 2 midnights of admission.   * I  certify that at the point of admission it is my clinical judgment that the Carla [name] will require inpatient hospital care spanning beyond 2 midnights from the point of admission due to high intensity of service, high risk for further deterioration and high frequency of surveillance required.*  Author: Krystn Dermody, DO 01/18/2024 6:15 AM  For on call review [email or web address].

## 2024-01-18 NOTE — Progress Notes (Signed)
 DAY ZERO NOTE    Carla [name]  FMW:[id number] DOB: 27-Jan-1991 DOA: 01/17/2024 PCP: Patient, No Pcp Per   Brief Narrative:  For complete history see admission note from earlier today  Carla [name] is a 33 y.o. female with medical history significant of GERD, NASH, CIDP, depression, morbid obesity who presents to the emergency department via EMS from [facility] after staff noted hypoxia on room air (60% per report) -at intake patient had imaging consistent with pneumonia, CT ruled PE out no obvious effusions.  Echo pending.   Assessment & Plan:   Principal Problem:   Acute respiratory failure with hypoxia (HCC) Active Problems:   GERD (gastroesophageal reflux disease)   Morbid obesity with BMI of 60.0-69.9, adult (HCC)   CAP (community acquired pneumonia)   Hypoalbuminemia due to protein-calorie malnutrition   Elevated d-dimer  Acute respiratory failure with hypoxia Acute HCAP pneumonia Rule out HF exacerbation (new diagnosis if confirmed) Complicated by OSA/OHS On room air at baseline - now requiring 3L [place] CT remarkable for airspace disease - no PE - no obvious effusions Flu/Covid/RSV negative Echo-->pending (proBNP elevated despite obesity -unclear if falsely elevated in the setting of recent procedure and bedbound status) -patient denies any recent swelling or edema or orthopnea Will broaden antibiotics to include Zosyn given recent hospitalization  Acute on chronic/subacute pain of left knee Patient recently had multiligamentous repair of L knee -Left ACL reconstruction, left PCL reconstruction, left medial meniscal debridement, left medial patellofemoral ligament reconstruction, left knee posterior lateral corner repair. -She continues nonweightbearing status at rehab facility, undergoing passive therapy as instructed per surgery -Continue oxycodone  5 mg every 4 hours as needed -Consult PT/OT eval and treat   Hypoalbuminemia - rule outmild protein calorie  malnutrition Albumin  3.1, continue supplement diet as appropriate   Elevated D-dimer D-dimer 0.58 - likely reactive in the setting of recent surgery CT angiography of chest showed no pulm embolism Left lower extremity DVT study limited but appears negative   GERD Continue PPI   Morbid obesity (Body mass index is 60.08 kg/m.) Diet and lifestyle modification discussed at length   DVT prophylaxis: SCDs Start: 01/18/24 0403 Code Status:   Code Status: Full Code Family Communication: None present  Status is: Inpatient  Dispo: The patient is from: SNF/facility              Anticipated d/c is to: Same              Anticipated d/c date is: 48 to 72 hours              Patient currently not medically stable for discharge given ongoing hypoxia from baseline need for IV antibiotics close monitoring  Consultants:  None  Procedures:  None  Antimicrobials:  Zosyn  Subjective: No acute issues or events overnight denies nausea vomiting diarrhea constipation headache fevers chills or chest pain.  Shortness of breath ongoing but markedly improved from prior.  Objective: Vitals:   01/18/24 0022 01/18/24 0030 01/18/24 0100 01/18/24 0147  BP:  109/72 (!) 105/52 119/74  Pulse:  98 89 96  Resp:  19 19 18   Temp: 97.7 F (36.5 C)   98.4 F (36.9 C)  TempSrc: Oral   Oral  SpO2:  94% 97% 93%  Weight:      Height:        Intake/Output Summary (Last 24 hours) at 01/18/2024 0722 Last data filed at 01/18/2024 0600 Gross per 24 hour  Intake 490 ml  Output --  Net 490 ml   Filed Weights   01/17/24 1710  Weight: (!) 158.8 kg    Examination:  General:  Pleasantly resting in bed, No acute distress. HEENT:  Normocephalic atraumatic.  Sclerae nonicteric, noninjected.  Extraocular movements intact bilaterally. Neck:  Without mass or deformity.  Trachea is midline. Lungs: Without rales or wheeze, coarse breath sounds bilaterally throughout Heart:  Regular rate and rhythm.  Without  murmurs, rubs, or gallops. Abdomen:  Soft, obese, nontender, nondistended.  Without guarding or rebound. Extremities: Postop changes to the left knee, numerous incisions all appear to be well-healing sutures clean dry intact  Data Reviewed: I have personally reviewed following labs and imaging studies  CBC: Recent Labs  Lab 01/14/24 1030 01/15/24 0039 01/17/24 1932 01/18/24 0415  WBC 12.1* 12.4* 11.0* 12.3*  NEUTROABS  --   --  7.9*  --   HGB 13.9 11.6* 11.2* 11.9*  HCT 45.1 37.1 36.2 38.9  MCV 89.8 89.6 90.7 91.1  PLT 372 304 244 379   Basic Metabolic Panel: Recent Labs  Lab 01/14/24 1030 01/15/24 0039 01/15/24 0548 01/17/24 1850 01/18/24 0415  NA 138  --  139 137 141  K 3.9  --  3.9 4.3 3.6  CL 103  --  103 100 100  CO2 23  --  24 30 31   GLUCOSE 100*  --  111* 105* 132*  BUN 8  --  6 <5* <5*  CREATININE 0.73 0.70 0.65 0.46 0.53  CALCIUM  8.5*  --  7.6* 8.4* 8.7*  MG  --   --   --   --  2.2  PHOS  --   --   --   --  2.4*   GFR: Estimated Creatinine Clearance: 149.2 mL/min (by C-G formula based on SCr of 0.53 mg/dL). Liver Function Tests: Recent Labs  Lab 01/14/24 1030 01/17/24 1850 01/18/24 0415  AST 57* 37 25  ALT 40 22 23  ALKPHOS 143* 142* 146*  BILITOT 0.9 0.9 0.9  PROT 7.2 6.4* 6.9  ALBUMIN  3.3* 3.1* 3.4*   No results for input(s): LIPASE, AMYLASE in the last 168 hours. No results for input(s): AMMONIA in the last 168 hours. Coagulation Profile: No results for input(s): INR, PROTIME in the last 168 hours. Cardiac Enzymes: No results for input(s): CKTOTAL, CKMB, CKMBINDEX, TROPONINI in the last 168 hours. BNP (last 3 results) Recent Labs    01/17/24 1850  PROBNP 1,109.0*   HbA1C: No results for input(s): HGBA1C in the last 72 hours. CBG: Recent Labs  Lab 01/17/24 1804  GLUCAP 119*   Lipid Profile: No results for input(s): CHOL, HDL, LDLCALC, TRIG, CHOLHDL, LDLDIRECT in the last 72 hours. Thyroid  Function  Tests: No results for input(s): TSH, T4TOTAL, FREET4, T3FREE, THYROIDAB in the last 72 hours. Anemia Panel: No results for input(s): VITAMINB12, FOLATE, FERRITIN, TIBC, IRON , RETICCTPCT in the last 72 hours. Sepsis Labs: No results for input(s): PROCALCITON, LATICACIDVEN in the last 168 hours.  Recent Results (from the past 240 hours)  Resp panel by RT-PCR (RSV, Flu A&B, Covid) Anterior Nasal Swab     Status: None   Collection Time: 01/17/24  5:14 PM   Specimen: Anterior Nasal Swab  Result Value Ref Range Status   SARS Coronavirus 2 by RT PCR NEGATIVE NEGATIVE Final    Comment: (NOTE) SARS-CoV-2 target nucleic acids are NOT DETECTED.  The SARS-CoV-2 RNA is generally detectable in upper respiratory specimens during the acute phase of infection. The lowest concentration of SARS-CoV-2 viral copies  this assay can detect is 138 copies/mL. A negative result does not preclude SARS-Cov-2 infection and should not be used as the sole basis for treatment or other patient management decisions. A negative result may occur with  improper specimen collection/handling, submission of specimen other than nasopharyngeal swab, presence of viral mutation(s) within the areas targeted by this assay, and inadequate number of viral copies(<138 copies/mL). A negative result must be combined with clinical observations, patient history, and epidemiological information. The expected result is Negative.  Fact Sheet for Patients:  bloggercourse.com  Fact Sheet for Healthcare Providers:  seriousbroker.it  This test is no t yet approved or cleared by the [place]  FDA and  has been authorized for detection and/or diagnosis of SARS-CoV-2 by FDA under an Emergency Use Authorization (EUA). This EUA will remain  in effect (meaning this test can be used) for the duration of the COVID-19 declaration under Section 564(b)(1) of the Act,  21 U.S.C.section 360bbb-3(b)(1), unless the authorization is terminated  or revoked sooner.       Influenza A by PCR NEGATIVE NEGATIVE Final   Influenza B by PCR NEGATIVE NEGATIVE Final    Comment: (NOTE) The Xpert Xpress SARS-CoV-2/FLU/RSV plus assay is intended as an aid in the diagnosis of influenza from Nasopharyngeal swab specimens and should not be used as a sole basis for treatment. Nasal washings and aspirates are unacceptable for Xpert Xpress SARS-CoV-2/FLU/RSV testing.  Fact Sheet for Patients: bloggercourse.com  Fact Sheet for Healthcare Providers: seriousbroker.it  This test is not yet approved or cleared by the [place]  FDA and has been authorized for detection and/or diagnosis of SARS-CoV-2 by FDA under an Emergency Use Authorization (EUA). This EUA will remain in effect (meaning this test can be used) for the duration of the COVID-19 declaration under Section 564(b)(1) of the Act, 21 U.S.C. section 360bbb-3(b)(1), unless the authorization is terminated or revoked.     Resp Syncytial Virus by PCR NEGATIVE NEGATIVE Final    Comment: (NOTE) Fact Sheet for Patients: bloggercourse.com  Fact Sheet for Healthcare Providers: seriousbroker.it  This test is not yet approved or cleared by the [place]  FDA and has been authorized for detection and/or diagnosis of SARS-CoV-2 by FDA under an Emergency Use Authorization (EUA). This EUA will remain in effect (meaning this test can be used) for the duration of the COVID-19 declaration under Section 564(b)(1) of the Act, 21 U.S.C. section 360bbb-3(b)(1), unless the authorization is terminated or revoked.  Performed at [facility], [address], [place], [place] [address]          Radiology Studies: CT Angio Chest PE W and/or Wo Contrast Result Date: 01/17/2024 EXAM: CTA of the Chest with contrast for PE  01/17/2024 11:05:19 PM TECHNIQUE: CTA of the chest was performed after the administration of intravenous contrast. Multiplanar reformatted images are provided for review. MIP images are provided for review. Automated exposure control, iterative reconstruction, and/or weight based adjustment of the mA/kV was utilized to reduce the radiation dose to as low as reasonably achievable. COMPARISON: CT angiogram chest 02/10/2022. CLINICAL HISTORY: Pulmonary embolism (PE) suspected, high prob. pt states that she is here for low O2sat, nausea and vomiting and a headache. Recent knee surgery; Scan delayed due to IV issues. FINDINGS: PULMONARY ARTERIES: Pulmonary arteries are adequately opacified for evaluation. No pulmonary embolism. Main pulmonary artery is normal in caliber. MEDIASTINUM: The heart and pericardium demonstrate no acute abnormality. There is no acute abnormality of the thoracic aorta. LYMPH NODES: No mediastinal, hilar or axillary  lymphadenopathy. LUNGS AND PLEURA: There are mild patchy multifocal airspacing ground-glass opacities in the bilateral upper lobes and bilateral lower lobes, right greater than left. No pleural effusion or pneumothorax. UPPER ABDOMEN: Limited images of the upper abdomen are unremarkable. SOFT TISSUES AND BONES: No acute bone or soft tissue abnormality. IMPRESSION: 1. No pulmonary embolism. 2. Mild patchy multifocal ground-glass opacities in the bilateral upper and lower lobes, right greater than left, compatible with an infectious or inflammatory process. Correlate clinically; consider short-interval chest CT follow-up if symptoms persist or worsen. Electronically signed by: Greig Pique MD 01/17/2024 11:19 PM EDT RP Workstation: [id number]   DG Chest Portable 1 View Result Date: 01/17/2024 CLINICAL DATA:  Hypoxia with nausea, vomiting and headache. EXAM: PORTABLE CHEST 1 VIEW COMPARISON:  April 12, 2021 FINDINGS: The cardiac silhouette is mildly enlarged and unchanged in size.  Low lung volumes are noted with mild, stable elevation of the right hemidiaphragm. There is prominence of the central pulmonary vasculature. No acute infiltrate, pleural effusion or pneumothorax is identified. The visualized skeletal structures are unremarkable. IMPRESSION: Low lung volumes with mild cardiomegaly and mild pulmonary vascular congestion. Electronically Signed   By: Suzen Dials M.D.   On: 01/17/2024 18:17        Scheduled Meds:  dextromethorphan -guaiFENesin   1 tablet Oral BID   enoxaparin  (LOVENOX ) injection  80 mg Subcutaneous Q24H   feeding supplement  237 mL Oral BID BM   pantoprazole   40 mg Oral Daily   Continuous Infusions:  [START ON 01/19/2024] cefTRIAXone  (ROCEPHIN )  IV     doxycycline (VIBRAMYCIN) IV 100 mg (01/18/24 0555)     LOS: 0 days    Time spent:    Elsie JAYSON Montclair, DO [facility]  If 7PM-7AM, please contact night-coverage [email or web address]  01/18/2024, 7:22 AM

## 2024-01-19 DIAGNOSIS — K2100 Gastro-esophageal reflux disease with esophagitis, without bleeding: Secondary | ICD-10-CM

## 2024-01-19 DIAGNOSIS — R7989 Other specified abnormal findings of blood chemistry: Secondary | ICD-10-CM | POA: Diagnosis not present

## 2024-01-19 DIAGNOSIS — J9601 Acute respiratory failure with hypoxia: Secondary | ICD-10-CM | POA: Diagnosis not present

## 2024-01-19 DIAGNOSIS — J189 Pneumonia, unspecified organism: Secondary | ICD-10-CM | POA: Diagnosis not present

## 2024-01-19 LAB — BASIC METABOLIC PANEL WITH GFR
Anion gap: 7 (ref 5–15)
BUN: 7 mg/dL (ref 6–20)
CO2: 32 mmol/L (ref 22–32)
Calcium: 8.4 mg/dL — ABNORMAL LOW (ref 8.9–10.3)
Chloride: 102 mmol/L (ref 98–111)
Creatinine, Ser: 0.5 mg/dL (ref 0.44–1.00)
GFR, Estimated: >60 mL/min (ref >60)
Glucose, Bld: 77 mg/dL (ref 70–99)
Potassium: 3.5 mmol/L (ref 3.5–5.1)
Sodium: 142 mmol/L (ref 135–145)

## 2024-01-19 LAB — CBC
HCT: 34.9 % — ABNORMAL LOW (ref 36.0–46.0)
Hemoglobin: 10.6 g/dL — ABNORMAL LOW (ref 12.0–15.0)
MCH: 27.6 pg (ref 26.0–34.0)
MCHC: 30.4 g/dL (ref 30.0–36.0)
MCV: 90.9 fL (ref 80.0–100.0)
Platelets: 266 K/uL (ref 150–400)
RBC: 3.84 MIL/uL — ABNORMAL LOW (ref 3.87–5.11)
RDW: 17.8 % — ABNORMAL HIGH (ref 11.5–15.5)
WBC: 6.8 K/uL (ref 4.0–10.5)
nRBC: 0.3 % — ABNORMAL HIGH (ref 0.0–0.2)

## 2024-01-19 MED ORDER — DIPHENHYDRAMINE HCL 25 MG PO CAPS
50.0000 mg | ORAL_CAPSULE | Freq: Four times a day (QID) | ORAL | Status: DC | PRN
  Administered 2024-01-19: 50 mg via ORAL
  Filled 2024-01-19: qty 2

## 2024-01-19 NOTE — Hospital Course (Signed)
 33 y.o. female with medical history significant of GERD, NASH, CIDP, depression, morbid obesity who presents to the emergency department via EMS from [facility] due to left knee complaint, headache and sensation of not feeling well.  Patient states that facility staff noted that her oxygen level dropped into the 60s on room air prior to arrival to the ED.  She endorsed ACL repair of her left knee on 01/14/2024.  She endorsed having nausea without vomiting and denies chest pain, numbness or tingling in the leg, shortness of breath, cough.    ED COURSE: In the emergency department, she was tachycardic with HR of 111 bpm, but all other vital signs were within normal range.  Workup in the ED showed normal CBC except for WBC of 11.0, hemoglobin 11.2.  BMP was normal except for blood glucose of 105, albumin  3.1, D-dimer 0.58, proBNP 1,109, pregnancy test was negative.  Influenza A, B, SARS, Nancyi, RSV was negative.  CT angiography of chest showed no pulmonary embolism but showed mild  patchy multifocal ground-glass opacities in the bilateral upper and lower lobes, right greater than left, compatible with an infectious or inflammatory process.   Chest x-ray showed mild cardiomegaly and mild pulmonary vascular congestion.  She was treated with IV ceftriaxone , pain medication was given due to knee pain, Zofran  was provided.

## 2024-01-19 NOTE — Plan of Care (Signed)
  Problem: Education: Goal: Knowledge of General Education information will improve Description: Including pain rating scale, medication(s)/side effects and non-pharmacologic comfort measures 01/19/2024 2032 by Rebecka Caron NOVAK, RN Outcome: Progressing 01/19/2024 1936 by Rebecka Caron NOVAK, RN Outcome: Progressing   Problem: Health Behavior/Discharge Planning: Goal: Ability to manage health-related needs will improve 01/19/2024 2032 by Rebecka Caron NOVAK, RN Outcome: Progressing 01/19/2024 1936 by Rebecka Caron NOVAK, RN Outcome: Progressing   Problem: Clinical Measurements: Goal: Ability to maintain clinical measurements within normal limits will improve 01/19/2024 2032 by Rebecka Caron NOVAK, RN Outcome: Progressing 01/19/2024 1936 by Rebecka Caron NOVAK, RN Outcome: Progressing Goal: Will remain free from infection 01/19/2024 2032 by Rebecka Caron NOVAK, RN Outcome: Progressing 01/19/2024 1936 by Rebecka Caron NOVAK, RN Outcome: Progressing Goal: Diagnostic test results will improve 01/19/2024 2032 by Rebecka Caron NOVAK, RN Outcome: Progressing 01/19/2024 1936 by Rebecka Caron NOVAK, RN Outcome: Progressing Goal: Respiratory complications will improve 01/19/2024 2032 by Rebecka Caron NOVAK, RN Outcome: Progressing 01/19/2024 1936 by Rebecka Caron NOVAK, RN Outcome: Progressing Goal: Cardiovascular complication will be avoided 01/19/2024 2032 by Rebecka Caron NOVAK, RN Outcome: Progressing 01/19/2024 1936 by Rebecka Caron NOVAK, RN Outcome: Progressing   Problem: Activity: Goal: Risk for activity intolerance will decrease 01/19/2024 2032 by Rebecka Caron NOVAK, RN Outcome: Progressing 01/19/2024 1936 by Rebecka Caron NOVAK, RN Outcome: Progressing   Problem: Nutrition: Goal: Adequate nutrition will be maintained 01/19/2024 2032 by Rebecka Caron NOVAK, RN Outcome: Progressing 01/19/2024 1936 by Rebecka Caron NOVAK, RN Outcome: Progressing   Problem: Coping: Goal: Level of anxiety will decrease 01/19/2024 2032 by Rebecka Caron NOVAK, RN Outcome:  Progressing 01/19/2024 1936 by Rebecka Caron NOVAK, RN Outcome: Progressing   Problem: Elimination: Goal: Will not experience complications related to bowel motility 01/19/2024 2032 by Rebecka Caron NOVAK, RN Outcome: Progressing 01/19/2024 1936 by Rebecka Caron NOVAK, RN Outcome: Progressing Goal: Will not experience complications related to urinary retention 01/19/2024 2032 by Rebecka Caron NOVAK, RN Outcome: Progressing 01/19/2024 1936 by Rebecka Caron NOVAK, RN Outcome: Progressing   Problem: Pain Managment: Goal: General experience of comfort will improve and/or be controlled 01/19/2024 2032 by Rebecka Caron NOVAK, RN Outcome: Progressing 01/19/2024 1936 by Rebecka Caron NOVAK, RN Outcome: Progressing   Problem: Safety: Goal: Ability to remain free from injury will improve 01/19/2024 2032 by Rebecka Caron NOVAK, RN Outcome: Progressing 01/19/2024 1936 by Rebecka Caron NOVAK, RN Outcome: Progressing   Problem: Skin Integrity: Goal: Risk for impaired skin integrity will decrease 01/19/2024 2032 by Rebecka Caron NOVAK, RN Outcome: Progressing 01/19/2024 1936 by Rebecka Caron NOVAK, RN Outcome: Progressing

## 2024-01-19 NOTE — Plan of Care (Signed)

## 2024-01-19 NOTE — Progress Notes (Signed)
 PROGRESS NOTE   Carla Little  FMW:[id number] DOB: 1990/09/09 DOA: 01/17/2024 PCP: Patient, No Pcp Per   Chief Complaint  Patient presents with   Leg Injury   Level of care: Telemetry  Brief Admission History:  33 y.o. female with medical history significant of GERD, NASH, CIDP, depression, morbid obesity who presents to the emergency department via EMS from [facility] due to left knee complaint, headache and sensation of not feeling well.  Patient states that facility staff noted that her oxygen level dropped into the 60s on room air prior to arrival to the ED.  She endorsed ACL repair of her left knee on 01/14/2024.  She endorsed having nausea without vomiting and denies chest pain, numbness or tingling in the leg, shortness of breath, cough.    ED COURSE: In the emergency department, she was tachycardic with HR of 111 bpm, but all other vital signs were within normal range.  Workup in the ED showed normal CBC except for WBC of 11.0, hemoglobin 11.2.  BMP was normal except for blood glucose of 105, albumin  3.1, D-dimer 0.58, proBNP 1,109, pregnancy test was negative.  Influenza A, B, SARS, Nancyi, RSV was negative.  CT angiography of chest showed no pulmonary embolism but showed mild  patchy multifocal ground-glass opacities in the bilateral upper and lower lobes, right greater than left, compatible with an infectious or inflammatory process.   Chest x-ray showed mild cardiomegaly and mild pulmonary vascular congestion.  She was treated with IV ceftriaxone , pain medication was given due to knee pain, Zofran  was provided.   Assessment and Plan:  Acute respiratory failure with hypoxia Acute HCAP pneumonia Rule out HF exacerbation (new diagnosis if confirmed) Complicated by OSA/OHS On room air at baseline - now requiring 3L [place]-- wean to room air as able  CT remarkable for airspace disease - no PE - no obvious effusions Flu/Covid/RSV negative TTE: LVEF 60-65% with indeterminate  diastolic function, normal RV function continue Zosyn given recent hospitalization   Acute on chronic/subacute pain of left knee Patient recently had multiligamentous repair of L knee -Left ACL reconstruction, left PCL reconstruction, left medial meniscal debridement, left medial patellofemoral ligament reconstruction, left knee posterior lateral corner repair. -She continues nonweightbearing status at rehab facility, undergoing passive therapy as instructed per surgery -Continue oxycodone  5 mg every 4 hours as needed -Consult PT/OT eval and treat   Hypoalbuminemia - rule outmild protein calorie malnutrition Albumin  3.1, continue supplement diet as appropriate   Elevated D-dimer D-dimer 0.58 - likely reactive in the setting of recent surgery CT angiography of chest showed no pulm embolism Left lower extremity DVT study limited but appears negative   GERD Continue PPI   Morbid obesity (Body mass index is 60.08 kg/m.) Diet and lifestyle modification discussed at length   DVT prophylaxis: SCDs Code Status: Full  Family Communication:  Disposition: TBD    Consultants:   Procedures:   Antimicrobials:    Subjective: Pt reports ongoing issues with diet tray, wants regular diet ordered   Objective: Vitals:   01/18/24 1302 01/18/24 2123 01/19/24 0458 01/19/24 1214  BP: 111/72 113/78 102/64 119/81  Pulse: 89 97 97 97  Resp:  20 20 16   Temp: 97.9 F (36.6 C) 98.4 F (36.9 C) 98.1 F (36.7 C) 98 F (36.7 C)  TempSrc: Oral Oral Oral Oral  SpO2: 98% 98% 98% 98%  Weight:      Height:       No intake or output data in the 24  hours ending 01/19/24 1446 Filed Weights   01/17/24 1710  Weight: (!) 158.8 kg   Examination:  General exam: Appears calm and comfortable, severe obesity.  Respiratory system: shallow BS no increased work of breathing.  Cardiovascular system: normal S1 & S2 heard. No JVD, murmurs, rubs, gallops or clicks. No pedal edema. Gastrointestinal system:  Abdomen is nondistended, soft and nontender. No organomegaly or masses felt. Normal bowel sounds heard. Central nervous system: Alert and oriented. No focal neurological deficits. Extremities: Symmetric 5 x 5 power. Skin: No rashes, lesions or ulcers. Psychiatry: Judgement and insight appear normal. Mood & affect appropriate.   Data Reviewed: I have personally reviewed following labs and imaging studies  CBC: Recent Labs  Lab 01/14/24 1030 01/15/24 0039 01/17/24 1932 01/18/24 0415 01/19/24 0424  WBC 12.1* 12.4* 11.0* 12.3* 6.8  NEUTROABS  --   --  7.9*  --   --   HGB 13.9 11.6* 11.2* 11.9* 10.6*  HCT 45.1 37.1 36.2 38.9 34.9*  MCV 89.8 89.6 90.7 91.1 90.9  PLT 372 304 244 379 266    Basic Metabolic Panel: Recent Labs  Lab 01/14/24 1030 01/15/24 0039 01/15/24 0548 01/17/24 1850 01/18/24 0415 01/19/24 0424  NA 138  --  139 137 141 142  K 3.9  --  3.9 4.3 3.6 3.5  CL 103  --  103 100 100 102  CO2 23  --  24 30 31  32  GLUCOSE 100*  --  111* 105* 132* 77  BUN 8  --  6 <5* <5* 7  CREATININE 0.73 0.70 0.65 0.46 0.53 0.50  CALCIUM  8.5*  --  7.6* 8.4* 8.7* 8.4*  MG  --   --   --   --  2.2  --   PHOS  --   --   --   --  2.4*  --     CBG: Recent Labs  Lab 01/17/24 1804  GLUCAP 119*    Recent Results (from the past 240 hours)  Resp panel by RT-PCR (RSV, Flu A&B, Covid) Anterior Nasal Swab     Status: None   Collection Time: 01/17/24  5:14 PM   Specimen: Anterior Nasal Swab  Result Value Ref Range Status   SARS Coronavirus 2 by RT PCR NEGATIVE NEGATIVE Final    Comment: (NOTE) SARS-CoV-2 target nucleic acids are NOT DETECTED.  The SARS-CoV-2 RNA is generally detectable in upper respiratory specimens during the acute phase of infection. The lowest concentration of SARS-CoV-2 viral copies this assay can detect is 138 copies/mL. A negative result does not preclude SARS-Cov-2 infection and should not be used as the sole basis for treatment or other patient management  decisions. A negative result may occur with  improper specimen collection/handling, submission of specimen other than nasopharyngeal swab, presence of viral mutation(s) within the areas targeted by this assay, and inadequate number of viral copies(<138 copies/mL). A negative result must be combined with clinical observations, patient history, and epidemiological information. The expected result is Negative.  Fact Sheet for Patients:  bloggercourse.com  Fact Sheet for Healthcare Providers:  seriousbroker.it  This test is no t yet approved or cleared by the [place]  FDA and  has been authorized for detection and/or diagnosis of SARS-CoV-2 by FDA under an Emergency Use Authorization (EUA). This EUA will remain  in effect (meaning this test can be used) for the duration of the COVID-19 declaration under Section 564(b)(1) of the Act, 21 U.S.C.section 360bbb-3(b)(1), unless the authorization is terminated  or revoked  sooner.       Influenza A by PCR NEGATIVE NEGATIVE Final   Influenza B by PCR NEGATIVE NEGATIVE Final    Comment: (NOTE) The Xpert Xpress SARS-CoV-2/FLU/RSV plus assay is intended as an aid in the diagnosis of influenza from Nasopharyngeal swab specimens and should not be used as a sole basis for treatment. Nasal washings and aspirates are unacceptable for Xpert Xpress SARS-CoV-2/FLU/RSV testing.  Fact Sheet for Patients: bloggercourse.com  Fact Sheet for Healthcare Providers: seriousbroker.it  This test is not yet approved or cleared by the [place]  FDA and has been authorized for detection and/or diagnosis of SARS-CoV-2 by FDA under an Emergency Use Authorization (EUA). This EUA will remain in effect (meaning this test can be used) for the duration of the COVID-19 declaration under Section 564(b)(1) of the Act, 21 U.S.C. section 360bbb-3(b)(1), unless the  authorization is terminated or revoked.     Resp Syncytial Virus by PCR NEGATIVE NEGATIVE Final    Comment: (NOTE) Fact Sheet for Patients: bloggercourse.com  Fact Sheet for Healthcare Providers: seriousbroker.it  This test is not yet approved or cleared by the [place]  FDA and has been authorized for detection and/or diagnosis of SARS-CoV-2 by FDA under an Emergency Use Authorization (EUA). This EUA will remain in effect (meaning this test can be used) for the duration of the COVID-19 declaration under Section 564(b)(1) of the Act, 21 U.S.C. section 360bbb-3(b)(1), unless the authorization is terminated or revoked.  Performed at [facility], [address] [place], [place] [address]   Culture, blood (Routine X 2) w Reflex to ID Panel     Status: None (Preliminary result)   Collection Time: 01/18/24  5:12 AM   Specimen: BLOOD RIGHT HAND  Result Value Ref Range Status   Specimen Description BLOOD RIGHT HAND BLOOD  Final   Special Requests   Final    Blood Culture results may not be optimal due to an inadequate volume of blood received in culture bottles AEROBIC BOTTLE ONLY   Culture   Final    NO GROWTH 1 DAY Performed at [facility], [address] [place], [place] [address]    Report Status PENDING  Incomplete  Culture, blood (Routine X 2) w Reflex to ID Panel     Status: None (Preliminary result)   Collection Time: 01/18/24  5:21 AM   Specimen: BLOOD RIGHT FOREARM  Result Value Ref Range Status   Specimen Description BLOOD RIGHT FOREARM BLOOD  Final   Special Requests   Final    AEROBIC BOTTLE ONLY Blood Culture results may not be optimal due to an inadequate volume of blood received in culture bottles   Culture   Final    NO GROWTH 1 DAY Performed at [facility], [address] [place], [place] [address]    Report Status PENDING  Incomplete     Radiology Studies: ECHOCARDIOGRAM COMPLETE Result Date:  01/18/2024    ECHOCARDIOGRAM REPORT   Patient Name:   Carla Little Date of Exam: 01/18/2024 Medical Rec #:  [id number]         Height:       64.0 in Accession #:    [id number]        Weight:       350.0 lb Date of Birth:  [birth year]        BSA:          2.482 m Patient Age:    33 years  BP:           119/74 mmHg Patient Gender: F                 HR:           96 bpm. Exam Location:  [facility] Procedure: 2D Echo, Cardiac Doppler and Color Doppler (Both Spectral and Color            Flow Doppler were utilized during procedure). Indications:    CHF-Acute Systolic I50.21  History:        Patient has no prior history of Echocardiogram examinations.                 Arrythmias:Tachycardia. Hx of Guillain Barr syndrome. COVID-19.                 Morbid Obesity.  Sonographer:    Aida Pizza RCS Referring Phys: [id number] OLADAPO ADEFESO  Sonographer Comments: Image acquisition challenging due to patient body habitus. IMPRESSIONS  1. Left ventricular ejection fraction, by estimation, is 60 to 65%. The left ventricle has normal function. The left ventricle has no regional wall motion abnormalities. There is mild left ventricular hypertrophy. Indeterminate diastolic filling due to E-A fusion.  2. Right ventricular systolic function is normal. The right ventricular size is normal.  3. The mitral valve is grossly normal. Trivial mitral valve regurgitation. No evidence of mitral stenosis.  4. The aortic valve is normal in structure. Aortic valve regurgitation is not visualized. No aortic stenosis is present. FINDINGS  Left Ventricle: Left ventricular ejection fraction, by estimation, is 60 to 65%. The left ventricle has normal function. The left ventricle has no regional wall motion abnormalities. The left ventricular internal cavity size was normal in size. There is  mild left ventricular hypertrophy. Indeterminate diastolic filling due to E-A fusion. Right Ventricle: The right ventricular size is normal. No  increase in right ventricular wall thickness. Right ventricular systolic function is normal. Left Atrium: Left atrial size was normal in size. Right Atrium: Right atrial size was normal in size. Pericardium: Trivial pericardial effusion is present. Mitral Valve: The mitral valve is grossly normal. Trivial mitral valve regurgitation. No evidence of mitral valve stenosis. Tricuspid Valve: The tricuspid valve is normal in structure. Tricuspid valve regurgitation is not demonstrated. No evidence of tricuspid stenosis. Aortic Valve: The aortic valve is normal in structure. Aortic valve regurgitation is not visualized. No aortic stenosis is present. Pulmonic Valve: The pulmonic valve was normal in structure. Pulmonic valve regurgitation is trivial. No evidence of pulmonic stenosis. Aorta: The aortic root is normal in size and structure. Venous: The inferior vena cava was not well visualized. IAS/Shunts: The interatrial septum was not well visualized.  LEFT VENTRICLE PLAX 2D LVIDd:         4.50 cm LVIDs:         2.80 cm LV PW:         1.20 cm LV IVS:        1.10 cm LVOT diam:     1.80 cm LV SV:         48 LV SV Index:   19 LVOT Area:     2.54 cm  RIGHT VENTRICLE RV S prime:     16.33 cm/s TAPSE (M-mode): 2.5 cm LEFT ATRIUM             Index        RIGHT ATRIUM           Index LA diam:  3.70 cm 1.49 cm/m   RA Area:     13.00 cm LA Vol (A2C):   46.8 ml 18.84 ml/m  RA Volume:   25.00 ml  10.07 ml/m LA Vol (A4C):   57.5 ml 23.17 ml/m LA Biplane Vol: 51.0 ml 20.55 ml/m  AORTIC VALVE LVOT Vmax:   118.00 cm/s LVOT Vmean:  66.950 cm/s LVOT VTI:    0.188 m  AORTA Ao Root diam: 3.40 cm MITRAL VALVE MV Area (PHT): 3.27 cm     SHUNTS MV Decel Time: 232 msec     Systemic VTI:  0.19 m MV E velocity: 127.00 cm/s  Systemic Diam: 1.80 cm MV A velocity: 94.30 cm/s MV E/A ratio:  1.35 Soyla Merck MD Electronically signed by Soyla Merck MD Signature Date/Time: 01/18/2024/5:59:49 PM    Final    US  Venous Img Lower  Left  (DVT Study) Result Date: 01/18/2024 CLINICAL DATA:  Recent surgery with elevated D-dimer and left lower extremity swelling. EXAM: LEFT LOWER EXTREMITY VENOUS DOPPLER ULTRASOUND TECHNIQUE: Gray-scale sonography with graded compression, as well as color Doppler and duplex ultrasound were performed to evaluate the lower extremity deep venous systems from the level of the common femoral vein and including the common femoral, femoral, profunda femoral, popliteal and calf veins including the posterior tibial, peroneal and gastrocnemius veins when visible. The superficial great saphenous vein was also interrogated. Spectral Doppler was utilized to evaluate flow at rest and with distal augmentation maneuvers in the common femoral, femoral and popliteal veins. COMPARISON:  None Available. FINDINGS: Contralateral Common Femoral Vein: Respiratory phasicity is normal and symmetric with the symptomatic side. No evidence of thrombus. Normal compressibility. Common Femoral Vein: No evidence of thrombus. Normal compressibility, respiratory phasicity and response to augmentation. Saphenofemoral Junction: No evidence of thrombus. Normal compressibility and flow on color Doppler imaging. Profunda Femoral Vein: No evidence of thrombus. Normal compressibility and flow on color Doppler imaging. Femoral Vein: No evidence of thrombus. Normal compressibility, respiratory phasicity and response to augmentation. Popliteal Vein: No evidence of thrombus. Normal compressibility, respiratory phasicity and response to augmentation. Calf Veins: The LEFT peroneal vein is limited in visualization. No evidence of thrombus within the LEFT posterior tibial vein. Normal compressibility and flow on color Doppler imaging. Superficial Great Saphenous Vein: No evidence of thrombus. Normal compressibility. Venous Reflux:  None. Other Findings:  None. IMPRESSION: Limited evaluation of the LEFT peroneal vein, without evidence of deep venous thrombosis within  the LEFT lower extremity. Electronically Signed   By: Suzen Dials M.D.   On: 01/18/2024 10:20   CT Angio Chest PE W and/or Wo Contrast Result Date: 01/17/2024 EXAM: CTA of the Chest with contrast for PE 01/17/2024 11:05:19 PM TECHNIQUE: CTA of the chest was performed after the administration of intravenous contrast. Multiplanar reformatted images are provided for review. MIP images are provided for review. Automated exposure control, iterative reconstruction, and/or weight based adjustment of the mA/kV was utilized to reduce the radiation dose to as low as reasonably achievable. COMPARISON: CT angiogram chest 02/10/2022. CLINICAL HISTORY: Pulmonary embolism (PE) suspected, high prob. pt states that she is here for low O2sat, nausea and vomiting and a headache. Recent knee surgery; Scan delayed due to IV issues. FINDINGS: PULMONARY ARTERIES: Pulmonary arteries are adequately opacified for evaluation. No pulmonary embolism. Main pulmonary artery is normal in caliber. MEDIASTINUM: The heart and pericardium demonstrate no acute abnormality. There is no acute abnormality of the thoracic aorta. LYMPH NODES: No mediastinal, hilar or axillary lymphadenopathy. LUNGS AND PLEURA: There are  mild patchy multifocal airspacing ground-glass opacities in the bilateral upper lobes and bilateral lower lobes, right greater than left. No pleural effusion or pneumothorax. UPPER ABDOMEN: Limited images of the upper abdomen are unremarkable. SOFT TISSUES AND BONES: No acute bone or soft tissue abnormality. IMPRESSION: 1. No pulmonary embolism. 2. Mild patchy multifocal ground-glass opacities in the bilateral upper and lower lobes, right greater than left, compatible with an infectious or inflammatory process. Correlate clinically; consider short-interval chest CT follow-up if symptoms persist or worsen. Electronically signed by: Greig Pique MD 01/17/2024 11:19 PM EDT RP Workstation: [id number]   DG Chest Portable 1  View Result Date: 01/17/2024 CLINICAL DATA:  Hypoxia with nausea, vomiting and headache. EXAM: PORTABLE CHEST 1 VIEW COMPARISON:  April 12, 2021 FINDINGS: The cardiac silhouette is mildly enlarged and unchanged in size. Low lung volumes are noted with mild, stable elevation of the right hemidiaphragm. There is prominence of the central pulmonary vasculature. No acute infiltrate, pleural effusion or pneumothorax is identified. The visualized skeletal structures are unremarkable. IMPRESSION: Low lung volumes with mild cardiomegaly and mild pulmonary vascular congestion. Electronically Signed   By: Suzen Dials M.D.   On: 01/17/2024 18:17    Scheduled Meds:  buPROPion   300 mg Oral Daily   busPIRone   20 mg Oral TID   dextromethorphan -guaiFENesin   1 tablet Oral BID   doxepin  25 mg Oral QHS   enoxaparin  (LOVENOX ) injection  80 mg Subcutaneous Q24H   feeding supplement  237 mL Oral BID BM   fluticasone  1 spray Each Nare Daily   hydrOXYzine   25 mg Oral QHS   linaclotide   290 mcg Oral QAC breakfast   pantoprazole   40 mg Oral Daily   pregabalin   100 mg Oral TID   pregabalin   25 mg Oral TID   senna-docusate  1 tablet Oral QHS   venlafaxine XR  225 mg Oral Q breakfast   Continuous Infusions:  doxycycline (VIBRAMYCIN) IV 100 mg (01/19/24 0507)   piperacillin-tazobactam (ZOSYN)  IV 3.375 g (01/19/24 1430)     LOS: 1 day   Time spent: 56 mins  Jamita Mckelvin Vicci, MD How to contact the [facility] Attending or Consulting provider 7A - 7P or covering provider during after hours 7P -7A, for this patient?  Check the care team in [facility] and look for a) attending/consulting [facility] provider listed and b) the [facility] team listed Log into [email or web address] to find provider on call.  Locate the [facility] provider you are looking for under [facility] and page to a number that you can be directly reached. If you still have difficulty reaching the provider, please page the [facility] (Director on Call) for the Hospitalists  listed on amion for assistance.  01/19/2024, 2:46 PM

## 2024-01-19 NOTE — TOC Initial Note (Signed)
 Transition of Care [facility]) - Initial/Assessment Note    Patient Details  Name: Carla Little MRN: [id number] Date of Birth: 07-Jul-1990  Transition of Care [facility]) CM/SW Contact:    Hoy DELENA Bigness, LCSW Phone Number: 01/19/2024, 8:47 AM  Clinical Narrative:                 Pt high risk for readmission. Pt recently discharged from [facility] hospital on 10/23 following rupture of ACL. Pt admitted this stay for acute respiratory failure. Pt is a LTC resident at [facility]. Pt currently requiring 6L of O2 and is not on O2 at baseline. Confirmed with Debbie at [facility] that pt is able to return when medically stable. ICM will continue to follow for discharge planning.   Expected Discharge Plan: Long Term Nursing Home Barriers to Discharge: Continued Medical Work up   Patient Goals and CMS Choice Patient states their goals for this hospitalization and ongoing recovery are:: To be independent CMS Medicare.gov Compare Post Acute Care list provided to:: Patient Choice offered to / list presented to : Patient      Expected Discharge Plan and Services In-house Referral: Clinical Social Work Discharge Planning Services: NA Post Acute Care Choice: Nursing Home, Skilled Nursing Facility Living arrangements for the past 2 months: Skilled Nursing Facility                 DME Arranged: N/A DME Agency: NA                  Prior Living Arrangements/Services Living arrangements for the past 2 months: Skilled Nursing Facility Lives with:: Facility Resident Patient language and need for interpreter reviewed:: Yes Do you feel safe going back to the place where you live?: Yes      Need for Family Participation in Patient Care: No (Comment) Care giver support system in place?: Yes (comment)   Criminal Activity/Legal Involvement Pertinent to Current Situation/Hospitalization: No - Comment as needed  Activities of Daily Living      Permission Sought/Granted Permission sought to  share information with : Facility Medical Sales Representative, Family Supports Permission granted to share information with : Yes, Verbal Permission Granted  Share Information with NAME: Caldwell,Rahmel (Significant other)  [phone]  Permission granted to share info w AGENCY: [facility]        Emotional Assessment Appearance:: Appears stated age     Orientation: : Oriented to Self, Oriented to Place, Oriented to  Time, Oriented to Situation Alcohol / Substance Use: Not Applicable Psych Involvement: No (comment)  Admission diagnosis:  Acute respiratory failure with hypoxia (HCC) [J96.01] Acute hypoxic respiratory failure (HCC) [J96.01] Patient Active Problem List   Diagnosis Date Noted   Hypoalbuminemia due to protein-calorie malnutrition 01/18/2024   Elevated d-dimer 01/18/2024   ACL tear 01/14/2024   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 01/14/2024   Rupture of posterior cruciate ligament of left knee 01/14/2024   Acute medial meniscus tear of left knee 01/14/2024   Injury of posterolateral corner of left knee 01/14/2024   Patellar instability of left knee 01/14/2024   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   Gait abnormality 03/12/2023   GERD (gastroesophageal reflux disease) 03/06/2023   Acute peptic ulcer of stomach 03/06/2023   Pain in left ankle and joints of left foot 03/27/2022   Anemia    Elevated LFTs    Transaminasemia    Morbid obesity with BMI of 60.0-69.9, adult (HCC) 04/21/2021   Acute respiratory failure with hypoxia (  HCC)    Pneumonia due to COVID-19 virus 04/12/2021   Hypomagnesemia 04/12/2021   Guillain Barr syndrome 12/21/2020   PUD (peptic ulcer disease) 12/09/2020   Class 3 obesity (HCC) 12/09/2020   Sinus tachycardia 12/09/2020   Depression    Iron  deficiency anemia due to chronic blood loss    Left knee dislocation 11/01/2020   Knee dislocation, left, initial encounter 11/01/2020   Altered mental state  06/08/2017   Encephalopathy, portal systemic (HCC) 06/08/2017   CAP (community acquired pneumonia) 06/08/2017   Hypokalemia 01/14/2017   Acute cystitis without hematuria 01/14/2017   Cellulitis, trunk 01/14/2017   PCP:  Patient, No Pcp Per Pharmacy:   [facility] [place], [place] [address] [place] [address] Phone: [phone] Fax: [phone]  [facility] - [place], [place] - [address] [place] [address] Phone: [phone] Fax: [phone]  [facility] - [place], [place] - [address] [place] [address] Phone: [phone] Fax: [phone]     Social Drivers of Health (SDOH) Social History: SDOH Screenings   Food Insecurity: No Food Insecurity (01/18/2024)  Housing: Low Risk  (01/18/2024)  Transportation Needs: No Transportation Needs (01/18/2024)  Utilities: Not At Risk (01/18/2024)  Tobacco Use: Low Risk  (01/17/2024)   SDOH Interventions:     Readmission Risk Interventions    01/19/2024    8:45 AM 04/28/2021    4:01 PM  Readmission Risk Prevention Plan  Transportation Screening Complete Complete  PCP or Specialist Appt within 3-5 Days Complete   HRI or Home Care Consult Complete   Social Work Consult for Recovery Care Planning/Counseling Complete   Palliative Care Screening Not Applicable   Medication Review Oceanographer) Complete Complete  PCP or Specialist appointment within 3-5 days of discharge  Complete  HRI or Home Care Consult  Complete  SW Recovery Care/Counseling Consult  Complete  Palliative Care Screening  Not Applicable  Skilled Nursing Facility  Complete

## 2024-01-20 ENCOUNTER — Ambulatory Visit (HOSPITAL_BASED_OUTPATIENT_CLINIC_OR_DEPARTMENT_OTHER): Payer: Self-pay | Admitting: Physical Therapy

## 2024-01-20 DIAGNOSIS — J189 Pneumonia, unspecified organism: Secondary | ICD-10-CM | POA: Diagnosis not present

## 2024-01-20 DIAGNOSIS — K2100 Gastro-esophageal reflux disease with esophagitis, without bleeding: Secondary | ICD-10-CM | POA: Diagnosis not present

## 2024-01-20 DIAGNOSIS — J9601 Acute respiratory failure with hypoxia: Secondary | ICD-10-CM | POA: Diagnosis not present

## 2024-01-20 MED ORDER — AMOXICILLIN-POT CLAVULANATE 875-125 MG PO TABS: 1.0000 | ORAL_TABLET | Freq: Two times a day (BID) | ORAL | Status: AC

## 2024-01-20 MED ORDER — DOXYCYCLINE HYCLATE 100 MG PO TABS: 100.0000 mg | ORAL_TABLET | Freq: Two times a day (BID) | ORAL | Status: DC

## 2024-01-20 MED ORDER — SENNOSIDES-DOCUSATE SODIUM 8.6-50 MG PO TABS: 1.0000 | ORAL_TABLET | Freq: Every day | ORAL | Status: AC

## 2024-01-20 MED ORDER — MILK AND MOLASSES ENEMA: 1.0000 | Freq: Once | RECTAL | Status: DC

## 2024-01-20 MED ORDER — IPRATROPIUM-ALBUTEROL 0.5-2.5 (3) MG/3ML IN SOLN: 3.0000 mL | Freq: Three times a day (TID) | RESPIRATORY_TRACT | Status: AC

## 2024-01-20 MED ORDER — ENSURE PLUS HIGH PROTEIN PO LIQD: 237.0000 mL | Freq: Two times a day (BID) | ORAL | Status: DC

## 2024-01-20 MED ORDER — OXYCODONE HCL 10 MG PO TABS: 10.0000 mg | ORAL_TABLET | Freq: Four times a day (QID) | ORAL | 0 refills | Status: AC | PRN

## 2024-01-20 MED ORDER — FLEET ENEMA RE ENEM
1.0000 | ENEMA | Freq: Once | RECTAL | Status: AC
  Administered 2024-01-20: 1 via RECTAL

## 2024-01-20 NOTE — Care Management Important Message (Signed)
 Important Message  Patient Details  Name: Carla Little MRN: [id number] Date of Birth: 1990-12-14   Important Message Given:  Yes - Medicare IM     Mckenzie Bove L Maleeha Halls 01/20/2024, 12:48 PM

## 2024-01-20 NOTE — Progress Notes (Signed)
 SATURATION QUALIFICATIONS: (This note is used to comply with regulatory documentation for home oxygen)  Patient Saturations on Room Air at Rest = 56%  Patient Saturations on 3 Liters of oxygen = 97%  Please briefly explain why patient needs home oxygen: The patient's oxygen was removed by PT and she was assessed 15 minutes later and presented with an oxygen saturation of 56% on room air at rest. I placed her oxygen back on and her saturation went up to 97% on 3 lpm via [place].

## 2024-01-20 NOTE — Plan of Care (Signed)
  Problem: Acute Rehab OT Goals (only OT should resolve) Goal: Pt. Will Perform Lower Body Bathing Flowsheets (Taken 01/20/2024 1010) Pt Will Perform Lower Body Bathing:  sitting/lateral leans  with adaptive equipment  with supervision  with contact guard assist Goal: Pt. Will Perform Lower Body Dressing Flowsheets (Taken 01/20/2024 1010) Pt Will Perform Lower Body Dressing:  with contact guard assist  with adaptive equipment  sitting/lateral leans Goal: Pt. Will Transfer To Toilet Flowsheets (Taken 01/20/2024 1010) Pt Will Transfer to Toilet:  with contact guard assist  stand pivot transfer Goal: Pt. Will Perform Toileting-Clothing Manipulation Flowsheets (Taken 01/20/2024 1010) Pt Will Perform Toileting - Clothing Manipulation and hygiene:  with contact guard assist  bed level  Damon Baisch OT, MOT

## 2024-01-20 NOTE — Discharge Instructions (Signed)
 IMPORTANT INFORMATION: PAY CLOSE ATTENTION   PHYSICIAN DISCHARGE INSTRUCTIONS  Follow with Primary care provider  Patient, No Pcp Per  and other consultants as instructed by your Hospitalist Physician  SEEK MEDICAL CARE OR RETURN TO EMERGENCY ROOM IF SYMPTOMS COME BACK, WORSEN OR NEW PROBLEM DEVELOPS   Please note: You were cared for by a hospitalist during your hospital stay. Every effort will be made to forward records to your primary care provider.  You can request that your primary care provider send for your hospital records if they have not received them.  Once you are discharged, your primary care physician will handle any further medical issues. Please note that NO REFILLS for any discharge medications will be authorized once you are discharged, as it is imperative that you return to your primary care physician (or establish a relationship with a primary care physician if you do not have one) for your post hospital discharge needs so that they can reassess your need for medications and monitor your lab values.  Please get a complete blood count and chemistry panel checked by your Primary MD at your next visit, and again as instructed by your Primary MD.  Get Medicines reviewed and adjusted: Please take all your medications with you for your next visit with your Primary MD  Laboratory/radiological data: Please request your Primary MD to go over all hospital tests and procedure/radiological results at the follow up, please ask your primary care provider to get all Hospital records sent to his/her office.  In some cases, they will be blood work, cultures and biopsy results pending at the time of your discharge. Please request that your primary care provider follow up on these results.  If you are diabetic, please bring your blood sugar readings with you to your follow up appointment with primary care.    Please call and make your follow up appointments as soon as possible.    Also Note  the following: If you experience worsening of your admission symptoms, develop shortness of breath, life threatening emergency, suicidal or homicidal thoughts you must seek medical attention immediately by calling 911 or calling your MD immediately  if symptoms less severe.  You must read complete instructions/literature along with all the possible adverse reactions/side effects for all the Medicines you take and that have been prescribed to you. Take any new Medicines after you have completely understood and accpet all the possible adverse reactions/side effects.   Do not drive when taking Pain medications or sleeping medications (Benzodiazepines)  Do not take more than prescribed Pain, Sleep and Anxiety Medications. It is not advisable to combine anxiety,sleep and pain medications without talking with your primary care practitioner  Special Instructions: If you have smoked or chewed Tobacco  in the last 2 yrs please stop smoking, stop any regular Alcohol  and or any Recreational drug use.  Wear Seat belts while driving.  Do not drive if taking any narcotic, mind altering or controlled substances or recreational drugs or alcohol.

## 2024-01-20 NOTE — Evaluation (Addendum)
 Occupational Therapy Evaluation Patient Details Name: Carla Little MRN: [id number] DOB: [birth year] Today's Date: 01/20/2024   History of Present Illness   Carla Little is a 33 y.o. female with medical history significant of GERD, NASH, CIDP, depression, morbid obesity who presents to the emergency department via EMS from [facility] due to left knee complaint, headache and sensation of not feeling well.  Patient states that facility staff noted that her oxygen level dropped into the 60s on room air prior to arrival to the ED.  She endorsed ACL repair of her left knee on 01/14/2024.  She endorsed having nausea without vomiting and denies chest pain, numbness or tingling in the leg, shortness of breath, cough, (Per DO)     Clinical Impressions Pt agreeable to OT and PT co-evaluation. Prior to surgery pt needed very minimal assist for lower body dressing and could bathe herself. Today pt demonstrated need for max to total assist for lower body tasks based on observation and clinical judgement. Pt required mod A for bed mobility and mod A for sit to stand from a raised bed with use of RW. Pt's B UE strength and A/ROM are [facility]. Pt left in the bed with call bell within reach. Pt left on room air due to saturation staying >90% without supplemental O2. Pt will benefit from continued OT in the hospital to increase strength, balance, and endurance for safe ADL's.        If plan is discharge home, recommend the following:   A lot of help with walking and/or transfers;A lot of help with bathing/dressing/bathroom;Assistance with cooking/housework;Assist for transportation;Help with stairs or ramp for entrance     Functional Status Assessment   Patient has had a recent decline in their functional status and demonstrates the ability to make significant improvements in function in a reasonable and predictable amount of time.     Equipment Recommendations   None recommended by OT              Precautions/Restrictions   Precautions Precautions: Fall Recall of Precautions/Restrictions: Intact Restrictions Weight Bearing Restrictions Per Provider Order: Yes LLE Weight Bearing Per Provider Order: Non weight bearing     Mobility Bed Mobility Overal bed mobility: Needs Assistance Bed Mobility: Supine to Sit, Sit to Supine     Supine to sit: HOB elevated, Used rails, Mod assist Sit to supine: Min assist   General bed mobility comments: Assist to move L LE to EOB; single hand held assist to pull to sit with countined assist to rotate L LE to sit at EOB. Labored movement but less physical assist for sit to supine, but still assist to lift L LE into bed.    Transfers Overall transfer level: Needs assistance Equipment used: Rolling walker (2 wheels) Transfers: Sit to/from Stand Sit to Stand: Mod assist, From elevated surface (2 other therapist's present but could have been done with just the PT today.)           General transfer comment: Standing for ~10 seconds at EOB with bed elevated.      Balance Overall balance assessment: Needs assistance Sitting-balance support: Bilateral upper extremity supported, Feet supported Sitting balance-Leahy Scale: Fair Sitting balance - Comments: seated at EOB   Standing balance support: Bilateral upper extremity supported, During functional activity, Reliant on assistive device for balance Standing balance-Leahy Scale: Poor Standing balance comment: Using RW with cuing for weight bearing status.  ADL either performed or assessed with clinical judgement   ADL Overall ADL's : Needs assistance/impaired     Grooming: Set up;Sitting   Upper Body Bathing: Set up;Sitting   Lower Body Bathing: Total assistance;Bed level;Sitting/lateral leans;Maximal assistance   Upper Body Dressing : Set up;Sitting   Lower Body Dressing: Maximal assistance;Total assistance;Bed  level;Sitting/lateral leans   Toilet Transfer: Moderate assistance;Stand-pivot;Rolling walker (2 wheels);Maximal assistance Toilet Transfer Details (indicate cue type and reason): Partially simulated via sit to stand from EOB with mod A using RW. Toileting- Clothing Manipulation and Hygiene: Maximal assistance;Total assistance;Bed level               Vision Baseline Vision/History: 1 Wears glasses Ability to See in Adequate Light: 1 Impaired Patient Visual Report: No change from baseline Vision Assessment?: No apparent visual deficits     Perception Perception: Not tested       Praxis Praxis: Not tested       Pertinent Vitals/Pain Pain Assessment Pain Assessment: 0-10 Pain Score: 10-Worst pain ever Pain Location: L knee Pain Descriptors / Indicators: Aching, Sharp Pain Intervention(s): Limited activity within patient's tolerance, Monitored during session, Repositioned     Extremity/Trunk Assessment Upper Extremity Assessment Upper Extremity Assessment: Overall WFL for tasks assessed;Generalized weakness   Lower Extremity Assessment Lower Extremity Assessment: Defer to PT evaluation   Cervical / Trunk Assessment Cervical / Trunk Assessment: Other exceptions Cervical / Trunk Exceptions: Increased Body Habitus   Communication Communication Communication: No apparent difficulties   Cognition Arousal: Alert Behavior During Therapy: WFL for tasks assessed/performed Cognition: No apparent impairments                               Following commands: Intact       Cueing  General Comments   Cueing Techniques: Verbal cues;Gestural cues;Tactile cues                 Home Living Family/patient expects to be discharged to:: Skilled nursing facility                                 Additional Comments: Resident and [facility]      Prior Functioning/Environment Prior Level of Function : Needs assist;History of Falls (last six  months)       Physical Assist : Mobility (physical);ADLs (physical) Mobility (physical): Transfers;Gait ADLs (physical): Toileting;Dressing;IADLs Mobility Comments: SPT without AD prior to surgery. Pt stood with PT once with assist using RW since operation. ADLs Comments: Pt reoprts 2% assistance with lower body dressing at baseline. REports ability to complete bathing without assist. Uses briefs for toileting.    OT Problem List: Decreased strength;Decreased activity tolerance;Impaired balance (sitting and/or standing);Obesity;Pain   OT Treatment/Interventions: Self-care/ADL training;Therapeutic exercise;Therapeutic activities;Patient/family education;Balance training;DME and/or AE instruction      OT Goals(Current goals can be found in the care plan section)   Acute Rehab OT Goals Patient Stated Goal: Improve function. OT Goal Formulation: With patient Time For Goal Achievement: 02/03/24 Potential to Achieve Goals: Good   OT Frequency:  Min 2X/week    Co-evaluation PT/OT/SLP Co-Evaluation/Treatment: Yes Reason for Co-Treatment: To address functional/ADL transfers   OT goals addressed during session: ADL's and self-care                       End of Session Equipment Utilized During Treatment: Rolling walker (2 wheels)  Activity Tolerance: Patient tolerated treatment well Patient left: in bed;with call bell/phone within reach  OT Visit Diagnosis: Unsteadiness on feet (R26.81);Other abnormalities of gait and mobility (R26.89);Muscle weakness (generalized) (M62.81)                Time: 9151-9086 OT Time Calculation (min): 25 min Charges:  OT General Charges $OT Visit: 1 Visit OT Evaluation $OT Eval Low Complexity: 1 Low  Quayshawn Nin OT, MOT  Jayson Person 01/20/2024, 10:08 AM

## 2024-01-20 NOTE — Evaluation (Signed)
 Physical Therapy Evaluation Patient Details Name: Carla Little MRN: [id number] DOB: 08-09-1990 Today's Date: 01/20/2024  History of Present Illness  Carla Little is a 33 y.o. female with medical history significant of GERD, NASH, CIDP, depression, morbid obesity who presents to the emergency department via EMS from [facility] due to left knee complaint, headache and sensation of not feeling well.  Patient states that facility staff noted that her oxygen level dropped into the 60s on room air prior to arrival to the ED.  She endorsed ACL repair of her left knee on 01/14/2024.  She endorsed having nausea without vomiting and denies chest pain, numbness or tingling in the leg, shortness of breath, cough,   Clinical Impression  Patient demonstrates slow labored movement for sitting up at bedside with most difficulty moving LLE due to severe knee pain, once seated able to maintain sitting balance, tolerated standing for 30 to 40 seconds using RW with fair/good return for keeping body weight off LLE. Patient unable to transfer to chair due to weakness and demonstrates good return for using BUE and RLE for repositioning self in bed. PLAN:  Patient to be discharged home today and discharged from acute physical therapy to care of nursing for sitting up at bedside as tolerated for length of stay with recommendations stated below.         If plan is discharge home, recommend the following: Two people to help with walking and/or transfers;A lot of help with bathing/dressing/bathroom;Assistance with cooking/housework;Assist for transportation;Help with stairs or ramp for entrance   Can travel by private vehicle   No    Equipment Recommendations None recommended by PT  Recommendations for Other Services       Functional Status Assessment Patient has had a recent decline in their functional status and demonstrates the ability to make significant improvements in function in a reasonable and  predictable amount of time.     Precautions / Restrictions Precautions Precautions: Fall Recall of Precautions/Restrictions: Intact Restrictions Weight Bearing Restrictions Per Provider Order: Yes LLE Weight Bearing Per Provider Order: Non weight bearing      Mobility  Bed Mobility Overal bed mobility: Needs Assistance Bed Mobility: Supine to Sit, Sit to Supine     Supine to sit: HOB elevated, Used rails, Mod assist Sit to supine: Min assist   General bed mobility comments: slow labored movement, poor tolerance for moving LLE due to increasing pain    Transfers Overall transfer level: Needs assistance Equipment used: Rolling walker (2 wheels) Transfers: Sit to/from Stand Sit to Stand: Mod assist, From elevated surface           General transfer comment: labored movement with with fair/good return for maintaining NWB on LLE    Ambulation/Gait                  Stairs            Wheelchair Mobility     Tilt Bed    Modified Rankin (Stroke Patients Only)       Balance Overall balance assessment: Needs assistance Sitting-balance support: Feet supported, No upper extremity supported Sitting balance-Leahy Scale: Fair Sitting balance - Comments: seated at EOB   Standing balance support: Bilateral upper extremity supported, During functional activity, Reliant on assistive device for balance Standing balance-Leahy Scale: Poor Standing balance comment: Using RW with cuing for weight bearing status.  Pertinent Vitals/Pain Pain Assessment Pain Assessment: 0-10 Pain Score: 6  Faces Pain Scale: Hurts even more Pain Location: L knee Pain Descriptors / Indicators: Aching, Sharp Pain Intervention(s): Limited activity within patient's tolerance, Monitored during session, Repositioned    Home Living Family/patient expects to be discharged to:: Skilled nursing facility                   Additional  Comments: Resident and [facility]    Prior Function Prior Level of Function : Needs assist;History of Falls (last six months)       Physical Assist : Mobility (physical);ADLs (physical) Mobility (physical): Transfers;Gait ADLs (physical): Toileting;Dressing;IADLs Mobility Comments: SPT without AD prior to surgery. Pt stood with PT once with assist using RW since operation. ADLs Comments: Pt reoprts 2% assistance with lower body dressing at baseline. REports ability to complete bathing without assist. Uses briefs for toileting.     Extremity/Trunk Assessment   Upper Extremity Assessment Upper Extremity Assessment: Defer to OT evaluation    Lower Extremity Assessment Lower Extremity Assessment: Generalized weakness;LLE deficits/detail LLE Deficits / Details: grossly -3/5 except left knee not tested LLE: Unable to fully assess due to pain LLE Sensation: WNL LLE Coordination: WNL    Cervical / Trunk Assessment Cervical / Trunk Assessment: Other exceptions Cervical / Trunk Exceptions: Increased Body Habitus  Communication   Communication Communication: No apparent difficulties    Cognition Arousal: Alert Behavior During Therapy: WFL for tasks assessed/performed   PT - Cognitive impairments: No apparent impairments                       PT - Cognition Comments: Pt A,Ox4 Following commands: Intact       Cueing Cueing Techniques: Verbal cues, Gestural cues, Tactile cues     General Comments      Exercises     Assessment/Plan    PT Assessment All further PT needs can be met in the next venue of care  PT Problem List Decreased strength;Decreased range of motion;Decreased activity tolerance;Decreased balance;Decreased mobility;Pain       PT Treatment Interventions      PT Goals (Current goals can be found in the Care Plan section)  Acute Rehab PT Goals Patient Stated Goal: Regain independence PT Goal Formulation: With patient Time For Goal  Achievement: 02/03/24 Potential to Achieve Goals: Good    Frequency       Co-evaluation PT/OT/SLP Co-Evaluation/Treatment: Yes Reason for Co-Treatment: To address functional/ADL transfers PT goals addressed during session: Mobility/safety with mobility;Balance;Proper use of DME OT goals addressed during session: ADL's and self-care       AM-PAC PT 6 Clicks Mobility  Outcome Measure Help needed turning from your back to your side while in a flat bed without using bedrails?: A Lot Help needed moving from lying on your back to sitting on the side of a flat bed without using bedrails?: A Lot Help needed moving to and from a bed to a chair (including a wheelchair)?: Total Help needed standing up from a chair using your arms (e.g., wheelchair or bedside chair)?: Total Help needed to walk in hospital room?: Total Help needed climbing 3-5 steps with a railing? : Total 6 Click Score: 8    End of Session   Activity Tolerance: Patient tolerated treatment well;Patient limited by pain Patient left: in bed;with call bell/phone within reach Nurse Communication: Mobility status PT Visit Diagnosis: Unsteadiness on feet (R26.81);Other abnormalities of gait and mobility (R26.89);Muscle weakness (generalized) (M62.81);Pain Pain -  Right/Left: Left Pain - part of body: Knee    Time: 9154-9089 PT Time Calculation (min) (ACUTE ONLY): 25 min   Charges:   PT Evaluation $PT Eval Moderate Complexity: 1 Mod PT Treatments $Therapeutic Activity: 23-37 mins PT General Charges $$ ACUTE PT VISIT: 1 Visit         12:12 PM, 01/20/24 Lynwood Music, MPT Physical Therapist with [facility] [phone] office [phone] mobile phone

## 2024-01-20 NOTE — Discharge Summary (Signed)
 Physician Discharge Summary  CARROLL RANNEY FMW:[id number] DOB: 07-24-1990 DOA: 01/17/2024   Admit date: 01/17/2024 Discharge date: 01/20/2024  Admitted From:  [facility] Disposition:  [facility]  Recommendations for Outpatient Follow-up:  Follow up with PCP in 1 weeks Please obtain BMP/CBC in 1-2 weeks Wean supplemental oxygen as able   Home Health:  LTC, DME home O2   Discharge Condition: STABLE   CODE STATUS: FULL DIET: resume previous    Brief Hospitalization Summary: Please see all hospital notes, images, labs for full details of the hospitalization. Brief Admission History:  33 y.o. female with medical history significant of GERD, NASH, CIDP, depression, morbid obesity who presents to the emergency department via EMS from [facility] due to left knee complaint, headache and sensation of not feeling well.  Patient states that facility staff noted that her oxygen level dropped into the 60s on room air prior to arrival to the ED.  She endorsed ACL repair of her left knee on 01/14/2024.  She endorsed having nausea without vomiting and denies chest pain, numbness or tingling in the leg, shortness of breath, cough.    ED COURSE: In the emergency department, she was tachycardic with HR of 111 bpm, but all other vital signs were within normal range.  Workup in the ED showed normal CBC except for WBC of 11.0, hemoglobin 11.2.  BMP was normal except for blood glucose of 105, albumin  3.1, D-dimer 0.58, proBNP 1,109, pregnancy test was negative.  Influenza A, B, SARS, Nancyi, RSV was negative.  CT angiography of chest showed no pulmonary embolism but showed mild  patchy multifocal ground-glass opacities in the bilateral upper and lower lobes, right greater than left, compatible with an infectious or inflammatory process.   Chest x-ray showed mild cardiomegaly and mild pulmonary vascular congestion.  She was treated with IV ceftriaxone , pain medication was given due to knee pain,  Zofran  was provided.   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 Pt requiring 3L [place]-- wean to room air as able -- Pt will discharge with home oxygen CT remarkable for airspace disease - no PE/PE ruled out - no obvious effusions Flu/Covid/RSV negative TTE: LVEF 60-65% with indeterminate diastolic function, normal RV function Pneumonia treated with IV Zosyn/doxycycline and will DC on oral augmentin to complete course   Acute on chronic/subacute pain of left knee Patient recently had multiligamentous repair of L knee -Left ACL reconstruction, left PCL reconstruction, left medial meniscal debridement, left medial patellofemoral ligament reconstruction, left knee posterior lateral corner repair. -She continues nonweightbearing status at rehab facility, undergoing passive therapy as instructed per surgery -Continue oxycodone  10 mg every 6 hours as needed--prior to admission medication -PT/OT eval and treat was consulted    Hypoalbuminemia - rule outmild protein calorie malnutrition Albumin  3.1, continue supplement diet as appropriate   Elevated D-dimer D-dimer 0.58 - likely reactive in the setting of recent surgery CT angiography of chest showed no pulm embolism Left lower extremity DVT study limited but appears negative   GERD Continue PPI   Morbid obesity (Body mass index is 60.08 kg/m.) Diet and lifestyle modification discussed at length   Discharge Diagnoses:  Principal Problem:   Acute respiratory failure with hypoxia (HCC) Active Problems:   GERD (gastroesophageal reflux disease)   Morbid obesity with BMI of 60.0-69.9, adult (HCC)   CAP (community acquired pneumonia)   Hypoalbuminemia  due to protein-calorie malnutrition   Elevated d-dimer   Discharge Instructions:  Allergies as of 01/20/2024       Reactions    Azithromycin  Shortness Of Breath, Nausea And Vomiting        Medication List     TAKE these medications    acetaminophen  325 MG tablet Commonly known as: TYLENOL  Take 2 tablets (650 mg total) by mouth every 6 (six) hours as needed for mild pain (or Fever >/= 101).   amoxicillin-clavulanate 875-125 MG tablet Commonly known as: AUGMENTIN Take 1 tablet by mouth 2 (two) times daily for 4 days.   APPLE CIDER VINEGAR PO Take 2 each by mouth daily. [place]oli Apple Cider Vinegar Gummies   ascorbic acid  500 MG tablet Commonly known as: VITAMIN C Take 500 mg by mouth daily.   BRAIN SUPPORT PO Take 3 each by mouth daily. [place]oli Matcha Mind Cognitive Gummies What changed: Another medication with the same name was added. Make sure you understand how and when to take each.   feeding supplement Liqd Take 237 mLs by mouth 2 (two) times daily between meals. What changed: You were already taking a medication with the same name, and this prescription was added. Make sure you understand how and when to take each.   buPROPion  300 MG 24 hr tablet Commonly known as: WELLBUTRIN  XL Take 300 mg by mouth daily.   busPIRone  10 MG tablet Commonly known as: BUSPAR  Take 20 mg by mouth 3 (three) times daily.   cholecalciferol  25 MCG (1000 UNIT) tablet Commonly known as: VITAMIN D3 Take 2,000 Units by mouth daily.   cyanocobalamin  1000 MCG/ML injection Commonly known as: VITAMIN B12 Inject 1 mL (1,000 mcg total) into the muscle every 30 (thirty) days.   doxepin 25 MG capsule Commonly known as: SINEQUAN Take 25 mg by mouth at bedtime.   fluticasone 50 MCG/ACT nasal spray Commonly known as: FLONASE Place 1 spray into both nostrils daily.   folic acid  1 MG tablet Commonly known as: FOLVITE  Take 1 tablet (1 mg total) by mouth daily.   hydrOXYzine  25 MG tablet Commonly known as: ATARAX  Take 25 mg by mouth at bedtime.   ipratropium-albuterol  0.5-2.5 (3) MG/3ML Soln Commonly known as: DUONEB Take  3 mLs by nebulization 3 (three) times daily. What changed:  when to take this reasons to take this   lansoprazole 30 MG capsule Commonly known as: PREVACID Take 30 mg by mouth daily.   linaclotide  290 MCG Caps capsule Commonly known as: LINZESS  Take 1 capsule (290 mcg total) by mouth daily before breakfast.   loperamide 2 MG capsule Commonly known as: IMODIUM Take 2 mg by mouth every 6 (six) hours as needed for diarrhea or loose stools.   Melatonin 5 MG Chew Chew 10 mg by mouth at bedtime. Goli Dreamy Sleep Gummies   methocarbamol  500 MG tablet Commonly known as: ROBAXIN  Take 1 tablet (500 mg total) by mouth every 6 (six) hours as needed for muscle spasms.   multivitamin capsule Take 1 capsule by mouth daily.   Narcan 4 MG/0.1ML Liqd nasal spray kit Generic drug: naloxone Place 1 spray into the nose once.   ondansetron  4 MG tablet Commonly known as: ZOFRAN  Take 4 mg by mouth every 8 (eight) hours as needed.   Oxycodone  HCl 10 MG Tabs Take 1 tablet (10 mg total) by mouth every 6 (six) hours as needed (pain).   polyethylene glycol 17 g packet Commonly known as: MIRALAX  / GLYCOLAX  Take 17 g by  mouth 2 (two) times daily as needed for moderate constipation.   pregabalin  100 MG capsule Commonly known as: LYRICA  Take 100 mg by mouth 3 (three) times daily.   pregabalin  25 MG capsule Commonly known as: LYRICA  Take 25 mg by mouth 3 (three) times daily.   senna-docusate 8.6-50 MG tablet Commonly known as: Senokot-S Take 1 tablet by mouth at bedtime.   Venlafaxine HCl 225 MG Tb24 Take 1 tablet by mouth daily.               Durable Medical Equipment  (From admission, onward)           Start     Ordered   01/20/24 1030  For home use only DME oxygen  Once       Question Answer Comment  Length of Need 12 Months   Mode or (Route) Nasal cannula   Liters per Minute 3   Frequency Continuous (stationary and portable oxygen unit needed)   Oxygen conserving  device Yes   Oxygen delivery system: Gas      01/20/24 1029            Follow-up Information     Primary Care Provider. Schedule an appointment as soon as possible for a visit in 1 week(s).   Why: Hospital Follow Up               Allergies  Allergen Reactions   Azithromycin  Shortness Of Breath and Nausea And Vomiting   Allergies as of 01/20/2024       Reactions   Azithromycin  Shortness Of Breath, Nausea And Vomiting        Medication List     TAKE these medications    acetaminophen  325 MG tablet Commonly known as: TYLENOL  Take 2 tablets (650 mg total) by mouth every 6 (six) hours as needed for mild pain (or Fever >/= 101).   amoxicillin-clavulanate 875-125 MG tablet Commonly known as: AUGMENTIN Take 1 tablet by mouth 2 (two) times daily for 4 days.   APPLE CIDER VINEGAR PO Take 2 each by mouth daily. Goli Apple Cider Vinegar Gummies   ascorbic acid  500 MG tablet Commonly known as: VITAMIN C Take 500 mg by mouth daily.   BRAIN SUPPORT PO Take 3 each by mouth daily. Goli Matcha Mind Cognitive Gummies What changed: Another medication with the same name was added. Make sure you understand how and when to take each.   feeding supplement Liqd Take 237 mLs by mouth 2 (two) times daily between meals. What changed: You were already taking a medication with the same name, and this prescription was added. Make sure you understand how and when to take each.   buPROPion  300 MG 24 hr tablet Commonly known as: WELLBUTRIN  XL Take 300 mg by mouth daily.   busPIRone  10 MG tablet Commonly known as: BUSPAR  Take 20 mg by mouth 3 (three) times daily.   cholecalciferol  25 MCG (1000 UNIT) tablet Commonly known as: VITAMIN D3 Take 2,000 Units by mouth daily.   cyanocobalamin  1000 MCG/ML injection Commonly known as: VITAMIN B12 Inject 1 mL (1,000 mcg total) into the muscle every 30 (thirty) days.   doxepin 25 MG capsule Commonly known as: SINEQUAN Take 25 mg by  mouth at bedtime.   fluticasone 50 MCG/ACT nasal spray Commonly known as: FLONASE Place 1 spray into both nostrils daily.   folic acid  1 MG tablet Commonly known as: FOLVITE  Take 1 tablet (1 mg total) by mouth daily.   hydrOXYzine  25  MG tablet Commonly known as: ATARAX  Take 25 mg by mouth at bedtime.   ipratropium-albuterol  0.5-2.5 (3) MG/3ML Soln Commonly known as: DUONEB Take 3 mLs by nebulization 3 (three) times daily. What changed:  when to take this reasons to take this   lansoprazole 30 MG capsule Commonly known as: PREVACID Take 30 mg by mouth daily.   linaclotide  290 MCG Caps capsule Commonly known as: LINZESS  Take 1 capsule (290 mcg total) by mouth daily before breakfast.   loperamide 2 MG capsule Commonly known as: IMODIUM Take 2 mg by mouth every 6 (six) hours as needed for diarrhea or loose stools.   Melatonin 5 MG Chew Chew 10 mg by mouth at bedtime. Goli Dreamy Sleep Gummies   methocarbamol  500 MG tablet Commonly known as: ROBAXIN  Take 1 tablet (500 mg total) by mouth every 6 (six) hours as needed for muscle spasms.   multivitamin capsule Take 1 capsule by mouth daily.   Narcan 4 MG/0.1ML Liqd nasal spray kit Generic drug: naloxone Place 1 spray into the nose once.   ondansetron  4 MG tablet Commonly known as: ZOFRAN  Take 4 mg by mouth every 8 (eight) hours as needed.   Oxycodone  HCl 10 MG Tabs Take 1 tablet (10 mg total) by mouth every 6 (six) hours as needed (pain).   polyethylene glycol 17 g packet Commonly known as: MIRALAX  / GLYCOLAX  Take 17 g by mouth 2 (two) times daily as needed for moderate constipation.   pregabalin  100 MG capsule Commonly known as: LYRICA  Take 100 mg by mouth 3 (three) times daily.   pregabalin  25 MG capsule Commonly known as: LYRICA  Take 25 mg by mouth 3 (three) times daily.   senna-docusate 8.6-50 MG tablet Commonly known as: Senokot-S Take 1 tablet by mouth at bedtime.   Venlafaxine HCl 225 MG Tb24 Take  1 tablet by mouth daily.               Durable Medical Equipment  (From admission, onward)           Start     Ordered   01/20/24 1030  For home use only DME oxygen  Once       Question Answer Comment  Length of Need 12 Months   Mode or (Route) Nasal cannula   Liters per Minute 3   Frequency Continuous (stationary and portable oxygen unit needed)   Oxygen conserving device Yes   Oxygen delivery system: Gas      01/20/24 1029            Procedures/Studies: ECHOCARDIOGRAM COMPLETE Result Date: 01/18/2024    ECHOCARDIOGRAM REPORT   Patient Name:   ATIANNA HAIDAR Date of Exam: 01/18/2024 Medical Rec #:  [id number]         Height:       64.0 in Accession #:    [id number]        Weight:       350.0 lb Date of Birth:  Aug 28, 1990        BSA:          2.482 m Patient Age:    33 years          BP:           119/74 mmHg Patient Gender: F                 HR:           96 bpm. Exam Location:  [facility] Procedure: 2D Echo, Cardiac  Doppler and Color Doppler (Both Spectral and Color            Flow Doppler were utilized during procedure). Indications:    CHF-Acute Systolic I50.21  History:        Patient has no prior history of Echocardiogram examinations.                 Arrythmias:Tachycardia. Hx of Guillain Barr syndrome. COVID-19.                 Morbid Obesity.  Sonographer:    Aida Pizza RCS Referring Phys: [id number] OLADAPO ADEFESO  Sonographer Comments: Image acquisition challenging due to patient body habitus. IMPRESSIONS  1. Left ventricular ejection fraction, by estimation, is 60 to 65%. The left ventricle has normal function. The left ventricle has no regional wall motion abnormalities. There is mild left ventricular hypertrophy. Indeterminate diastolic filling due to E-A fusion.  2. Right ventricular systolic function is normal. The right ventricular size is normal.  3. The mitral valve is grossly normal. Trivial mitral valve regurgitation. No evidence of mitral stenosis.  4.  The aortic valve is normal in structure. Aortic valve regurgitation is not visualized. No aortic stenosis is present. FINDINGS  Left Ventricle: Left ventricular ejection fraction, by estimation, is 60 to 65%. The left ventricle has normal function. The left ventricle has no regional wall motion abnormalities. The left ventricular internal cavity size was normal in size. There is  mild left ventricular hypertrophy. Indeterminate diastolic filling due to E-A fusion. Right Ventricle: The right ventricular size is normal. No increase in right ventricular wall thickness. Right ventricular systolic function is normal. Left Atrium: Left atrial size was normal in size. Right Atrium: Right atrial size was normal in size. Pericardium: Trivial pericardial effusion is present. Mitral Valve: The mitral valve is grossly normal. Trivial mitral valve regurgitation. No evidence of mitral valve stenosis. Tricuspid Valve: The tricuspid valve is normal in structure. Tricuspid valve regurgitation is not demonstrated. No evidence of tricuspid stenosis. Aortic Valve: The aortic valve is normal in structure. Aortic valve regurgitation is not visualized. No aortic stenosis is present. Pulmonic Valve: The pulmonic valve was normal in structure. Pulmonic valve regurgitation is trivial. No evidence of pulmonic stenosis. Aorta: The aortic root is normal in size and structure. Venous: The inferior vena cava was not well visualized. IAS/Shunts: The interatrial septum was not well visualized.  LEFT VENTRICLE PLAX 2D LVIDd:         4.50 cm LVIDs:         2.80 cm LV PW:         1.20 cm LV IVS:        1.10 cm LVOT diam:     1.80 cm LV SV:         48 LV SV Index:   19 LVOT Area:     2.54 cm  RIGHT VENTRICLE RV S prime:     16.33 cm/s TAPSE (M-mode): 2.5 cm LEFT ATRIUM             Index        RIGHT ATRIUM           Index LA diam:        3.70 cm 1.49 cm/m   RA Area:     13.00 cm LA Vol (A2C):   46.8 ml 18.84 ml/m  RA Volume:   25.00 ml  10.07 ml/m  LA Vol (A4C):   57.5 ml 23.17 ml/m LA Biplane Vol: 51.0 ml  20.55 ml/m  AORTIC VALVE LVOT Vmax:   118.00 cm/s LVOT Vmean:  66.950 cm/s LVOT VTI:    0.188 m  AORTA Ao Root diam: 3.40 cm MITRAL VALVE MV Area (PHT): 3.27 cm     SHUNTS MV Decel Time: 232 msec     Systemic VTI:  0.19 m MV E velocity: 127.00 cm/s  Systemic Diam: 1.80 cm MV A velocity: 94.30 cm/s MV E/A ratio:  1.35 Soyla Merck MD Electronically signed by Soyla Merck MD Signature Date/Time: 01/18/2024/5:59:49 PM    Final    US  Venous Img Lower  Left (DVT Study) Result Date: 01/18/2024 CLINICAL DATA:  Recent surgery with elevated D-dimer and left lower extremity swelling. EXAM: LEFT LOWER EXTREMITY VENOUS DOPPLER ULTRASOUND TECHNIQUE: Gray-scale sonography with graded compression, as well as color Doppler and duplex ultrasound were performed to evaluate the lower extremity deep venous systems from the level of the common femoral vein and including the common femoral, femoral, profunda femoral, popliteal and calf veins including the posterior tibial, peroneal and gastrocnemius veins when visible. The superficial great saphenous vein was also interrogated. Spectral Doppler was utilized to evaluate flow at rest and with distal augmentation maneuvers in the common femoral, femoral and popliteal veins. COMPARISON:  None Available. FINDINGS: Contralateral Common Femoral Vein: Respiratory phasicity is normal and symmetric with the symptomatic side. No evidence of thrombus. Normal compressibility. Common Femoral Vein: No evidence of thrombus. Normal compressibility, respiratory phasicity and response to augmentation. Saphenofemoral Junction: No evidence of thrombus. Normal compressibility and flow on color Doppler imaging. Profunda Femoral Vein: No evidence of thrombus. Normal compressibility and flow on color Doppler imaging. Femoral Vein: No evidence of thrombus. Normal compressibility, respiratory phasicity and response to augmentation. Popliteal  Vein: No evidence of thrombus. Normal compressibility, respiratory phasicity and response to augmentation. Calf Veins: The LEFT peroneal vein is limited in visualization. No evidence of thrombus within the LEFT posterior tibial vein. Normal compressibility and flow on color Doppler imaging. Superficial Great Saphenous Vein: No evidence of thrombus. Normal compressibility. Venous Reflux:  None. Other Findings:  None. IMPRESSION: Limited evaluation of the LEFT peroneal vein, without evidence of deep venous thrombosis within the LEFT lower extremity. Electronically Signed   By: Suzen Dials M.D.   On: 01/18/2024 10:20   CT Angio Chest PE W and/or Wo Contrast Result Date: 01/17/2024 EXAM: CTA of the Chest with contrast for PE 01/17/2024 11:05:19 PM TECHNIQUE: CTA of the chest was performed after the administration of intravenous contrast. Multiplanar reformatted images are provided for review. MIP images are provided for review. Automated exposure control, iterative reconstruction, and/or weight based adjustment of the mA/kV was utilized to reduce the radiation dose to as low as reasonably achievable. COMPARISON: CT angiogram chest 02/10/2022. CLINICAL HISTORY: Pulmonary embolism (PE) suspected, high prob. pt states that she is here for low O2sat, nausea and vomiting and a headache. Recent knee surgery; Scan delayed due to IV issues. FINDINGS: PULMONARY ARTERIES: Pulmonary arteries are adequately opacified for evaluation. No pulmonary embolism. Main pulmonary artery is normal in caliber. MEDIASTINUM: The heart and pericardium demonstrate no acute abnormality. There is no acute abnormality of the thoracic aorta. LYMPH NODES: No mediastinal, hilar or axillary lymphadenopathy. LUNGS AND PLEURA: There are mild patchy multifocal airspacing ground-glass opacities in the bilateral upper lobes and bilateral lower lobes, right greater than left. No pleural effusion or pneumothorax. UPPER ABDOMEN: Limited images of the  upper abdomen are unremarkable. SOFT TISSUES AND BONES: No acute bone or soft tissue abnormality. IMPRESSION: 1.  No pulmonary embolism. 2. Mild patchy multifocal ground-glass opacities in the bilateral upper and lower lobes, right greater than left, compatible with an infectious or inflammatory process. Correlate clinically; consider short-interval chest CT follow-up if symptoms persist or worsen. Electronically signed by: Greig Pique MD 01/17/2024 11:19 PM EDT RP Workstation: [id number]   DG Chest Portable 1 View Result Date: 01/17/2024 CLINICAL DATA:  Hypoxia with nausea, vomiting and headache. EXAM: PORTABLE CHEST 1 VIEW COMPARISON:  April 12, 2021 FINDINGS: The cardiac silhouette is mildly enlarged and unchanged in size. Low lung volumes are noted with mild, stable elevation of the right hemidiaphragm. There is prominence of the central pulmonary vasculature. No acute infiltrate, pleural effusion or pneumothorax is identified. The visualized skeletal structures are unremarkable. IMPRESSION: Low lung volumes with mild cardiomegaly and mild pulmonary vascular congestion. Electronically Signed   By: Suzen Dials M.D.   On: 01/17/2024 18:17   DG Knee 1-2 Views Left Result Date: 01/14/2024 CLINICAL DATA:  Knee arthroscopy EXAM: DG KNEE 1-2V*L* COMPARISON:  12/21/2023 FLUOROSCOPY TIME:  Radiation Exposure Index (as provided by the fluoroscopic device): 22.08 If the device does not provide the exposure index: Fluoroscopy Time:  80 seconds Number of Acquired Images:  1 FINDINGS: Single spot film shows arthroscopy within the knee joint. IMPRESSION: Localization for knee arthroscopy Electronically Signed   By: Oneil Devonshire M.D.   On: 01/14/2024 21:59   DG C-Arm 1-60 Min-No Report Result Date: 01/14/2024 Fluoroscopy was utilized by the requesting physician.  No radiographic interpretation.   DG C-Arm 1-60 Min-No Report Result Date: 01/14/2024 Fluoroscopy was utilized by the requesting physician.  No  radiographic interpretation.   DG C-Arm 1-60 Min-No Report Result Date: 01/14/2024 Fluoroscopy was utilized by the requesting physician.  No radiographic interpretation.   DG Shoulder Right Result Date: 12/21/2023 CLINICAL DATA:  Shoulder pain and knee pain. EXAM: RIGHT SHOULDER - 2+ VIEW COMPARISON:  None Available. FINDINGS: There is no evidence of an acute fracture or dislocation. There is no evidence of arthropathy or other focal bone abnormality. Soft tissues are unremarkable. IMPRESSION: Negative. Electronically Signed   By: Suzen Dials M.D.   On: 12/21/2023 14:56   DG Shoulder Left Result Date: 12/21/2023 CLINICAL DATA:  Shoulder pain and knee pain. EXAM: LEFT SHOULDER - 2+ VIEW COMPARISON:  None Available. FINDINGS: There is no evidence of fracture or dislocation. There is no evidence of arthropathy or other focal bone abnormality. Soft tissues are unremarkable. IMPRESSION: Negative. Electronically Signed   By: Suzen Dials M.D.   On: 12/21/2023 14:51   DG Knee Complete 4 Views Left Result Date: 12/21/2023 CLINICAL DATA:  Shoulder pain and knee pain. EXAM: LEFT KNEE - COMPLETE 4+ VIEW COMPARISON:  October 21, 2023 FINDINGS: No evidence of an acute fracture. A small chronic deformity is seen involving the lateral tip of the lateral tibial plateau. There is mild, stable lateral subluxation of the left patella and left tibia. No evidence of arthropathy or other focal bone abnormality. A small joint effusion is seen. IMPRESSION: 1. Small joint effusion. 2. Mild, stable lateral subluxation of the left patella and left tibia. Electronically Signed   By: Suzen Dials M.D.   On: 12/21/2023 14:49   DG Knee Complete 4 Views Right Result Date: 12/21/2023 CLINICAL DATA:  Shoulder pain and knee pain. EXAM: RIGHT KNEE - COMPLETE 4+ VIEW COMPARISON:  October 21, 2023 FINDINGS: The study is limited secondary to the patient's body habitus. No evidence of an acute fracture or dislocation. No evidence  of arthropathy or other focal bone abnormality. Soft tissues are unremarkable. IMPRESSION: No acute osseous abnormality. Electronically Signed   By: Suzen Dials M.D.   On: 12/21/2023 14:46     Subjective: No complaints, agreeable to returning to SNF  Discharge Exam: Vitals:   01/19/24 2038 01/20/24 0432  BP: 130/87 123/73  Pulse: (!) 106 92  Resp: 18 18  Temp: 98.1 F (36.7 C) 98.3 F (36.8 C)  SpO2: 92% 98%   Vitals:   01/19/24 0458 01/19/24 1214 01/19/24 2038 01/20/24 0432  BP: 102/64 119/81 130/87 123/73  Pulse: 97 97 (!) 106 92  Resp: 20 16 18 18   Temp: 98.1 F (36.7 C) 98 F (36.7 C) 98.1 F (36.7 C) 98.3 F (36.8 C)  TempSrc: Oral Oral Oral Oral  SpO2: 98% 98% 92% 98%  Weight:      Height:       General exam: Appears calm and comfortable, severe obesity.  Respiratory system:  no increased work of breathing.  Cardiovascular system: normal S1 & S2 heard. No JVD, murmurs, rubs, gallops or clicks. No pedal edema. Gastrointestinal system: Abdomen is nondistended, soft and nontender. No organomegaly or masses felt. Normal bowel sounds heard. Central nervous system: Alert and oriented. No focal neurological deficits. Extremities: Symmetric 5 x 5 power. Skin: No rashes, lesions or ulcers. Psychiatry: Judgement and insight appear normal. Mood & affect appropriate.    The results of significant diagnostics from this hospitalization (including imaging, microbiology, ancillary and laboratory) are listed below for reference.     Microbiology: Recent Results (from the past 240 hours)  Resp panel by RT-PCR (RSV, Flu A&B, Covid) Anterior Nasal Swab     Status: None   Collection Time: 01/17/24  5:14 PM   Specimen: Anterior Nasal Swab  Result Value Ref Range Status   SARS Coronavirus 2 by RT PCR NEGATIVE NEGATIVE Final    Comment: (NOTE) SARS-CoV-2 target nucleic acids are NOT DETECTED.  The SARS-CoV-2 RNA is generally detectable in upper respiratory specimens during  the acute phase of infection. The lowest concentration of SARS-CoV-2 viral copies this assay can detect is 138 copies/mL. A negative result does not preclude SARS-Cov-2 infection and should not be used as the sole basis for treatment or other patient management decisions. A negative result may occur with  improper specimen collection/handling, submission of specimen other than nasopharyngeal swab, presence of viral mutation(s) within the areas targeted by this assay, and inadequate number of viral copies(<138 copies/mL). A negative result must be combined with clinical observations, patient history, and epidemiological information. The expected result is Negative.  Fact Sheet for Patients:  bloggercourse.com  Fact Sheet for Healthcare Providers:  seriousbroker.it  This test is no t yet approved or cleared by the [place]  FDA and  has been authorized for detection and/or diagnosis of SARS-CoV-2 by FDA under an Emergency Use Authorization (EUA). This EUA will remain  in effect (meaning this test can be used) for the duration of the COVID-19 declaration under Section 564(b)(1) of the Act, 21 U.S.C.section 360bbb-3(b)(1), unless the authorization is terminated  or revoked sooner.       Influenza A by PCR NEGATIVE NEGATIVE Final   Influenza B by PCR NEGATIVE NEGATIVE Final    Comment: (NOTE) The Xpert Xpress SARS-CoV-2/FLU/RSV plus assay is intended as an aid in the diagnosis of influenza from Nasopharyngeal swab specimens and should not be used as a sole basis for treatment. Nasal washings and aspirates are unacceptable for Xpert Xpress SARS-CoV-2/FLU/RSV testing.  Fact Sheet for Patients: bloggercourse.com  Fact Sheet for Healthcare Providers: seriousbroker.it  This test is not yet approved or cleared by the [place]  FDA and has been authorized for detection and/or  diagnosis of SARS-CoV-2 by FDA under an Emergency Use Authorization (EUA). This EUA will remain in effect (meaning this test can be used) for the duration of the COVID-19 declaration under Section 564(b)(1) of the Act, 21 U.S.C. section 360bbb-3(b)(1), unless the authorization is terminated or revoked.     Resp Syncytial Virus by PCR NEGATIVE NEGATIVE Final    Comment: (NOTE) Fact Sheet for Patients: bloggercourse.com  Fact Sheet for Healthcare Providers: seriousbroker.it  This test is not yet approved or cleared by the [place]  FDA and has been authorized for detection and/or diagnosis of SARS-CoV-2 by FDA under an Emergency Use Authorization (EUA). This EUA will remain in effect (meaning this test can be used) for the duration of the COVID-19 declaration under Section 564(b)(1) of the Act, 21 U.S.C. section 360bbb-3(b)(1), unless the authorization is terminated or revoked.  Performed at [facility], [address] [place], [place] [address]   Culture, blood (Routine X 2) w Reflex to ID Panel     Status: None (Preliminary result)   Collection Time: 01/18/24  5:12 AM   Specimen: BLOOD RIGHT HAND  Result Value Ref Range Status   Specimen Description BLOOD RIGHT HAND BLOOD  Final   Special Requests   Final    Blood Culture results may not be optimal due to an inadequate volume of blood received in culture bottles AEROBIC BOTTLE ONLY   Culture   Final    NO GROWTH 2 DAYS Performed at [facility], [address], [place], [place] [address]    Report Status PENDING  Incomplete  Culture, blood (Routine X 2) w Reflex to ID Panel     Status: None (Preliminary result)   Collection Time: 01/18/24  5:21 AM   Specimen: BLOOD RIGHT FOREARM  Result Value Ref Range Status   Specimen Description BLOOD RIGHT FOREARM BLOOD  Final   Special Requests   Final    AEROBIC BOTTLE ONLY Blood Culture results may not be optimal due to an  inadequate volume of blood received in culture bottles   Culture   Final    NO GROWTH 2 DAYS Performed at [facility], [address], [place], [place] [address]    Report Status PENDING  Incomplete     Labs: BNP (last 3 results) No results for input(s): BNP in the last 8760 hours. Basic Metabolic Panel: Recent Labs  Lab 01/14/24 1030 01/15/24 0039 01/15/24 0548 01/17/24 1850 01/18/24 0415 01/19/24 0424  NA 138  --  139 137 141 142  K 3.9  --  3.9 4.3 3.6 3.5  CL 103  --  103 100 100 102  CO2 23  --  24 30 31  32  GLUCOSE 100*  --  111* 105* 132* 77  BUN 8  --  6 <5* <5* 7  CREATININE 0.73 0.70 0.65 0.46 0.53 0.50  CALCIUM  8.5*  --  7.6* 8.4* 8.7* 8.4*  MG  --   --   --   --  2.2  --   PHOS  --   --   --   --  2.4*  --    Liver Function Tests: Recent Labs  Lab 01/14/24 1030 01/17/24 1850 01/18/24 0415  AST 57* 37 25  ALT 40 22 23  ALKPHOS 143* 142* 146*  BILITOT  0.9 0.9 0.9  PROT 7.2 6.4* 6.9  ALBUMIN  3.3* 3.1* 3.4*   No results for input(s): LIPASE, AMYLASE in the last 168 hours. No results for input(s): AMMONIA in the last 168 hours. CBC: Recent Labs  Lab 01/14/24 1030 01/15/24 0039 01/17/24 1932 01/18/24 0415 01/19/24 0424  WBC 12.1* 12.4* 11.0* 12.3* 6.8  NEUTROABS  --   --  7.9*  --   --   HGB 13.9 11.6* 11.2* 11.9* 10.6*  HCT 45.1 37.1 36.2 38.9 34.9*  MCV 89.8 89.6 90.7 91.1 90.9  PLT 372 304 244 379 266   Cardiac Enzymes: No results for input(s): CKTOTAL, CKMB, CKMBINDEX, TROPONINI in the last 168 hours. BNP: Invalid input(s): POCBNP CBG: Recent Labs  Lab 01/17/24 1804  GLUCAP 119*   D-Dimer Recent Labs    01/17/24 2004  DDIMER 0.58*   Hgb A1c No results for input(s): HGBA1C in the last 72 hours. Lipid Profile No results for input(s): CHOL, HDL, LDLCALC, TRIG, CHOLHDL, LDLDIRECT in the last 72 hours. Thyroid  function studies No results for input(s): TSH, T4TOTAL, T3FREE, THYROIDAB in the  last 72 hours.  Invalid input(s): FREET3 Anemia work up No results for input(s): VITAMINB12, FOLATE, FERRITIN, TIBC, IRON , RETICCTPCT in the last 72 hours. Urinalysis    Component Value Date/Time   COLORURINE AMBER (A) 04/15/2021 0624   APPEARANCEUR HAZY (A) 04/15/2021 0624   LABSPEC 1.018 04/15/2021 0624   PHURINE 7.0 04/15/2021 0624   GLUCOSEU NEGATIVE 04/15/2021 0624   HGBUR MODERATE (A) 04/15/2021 0624   BILIRUBINUR NEGATIVE 04/15/2021 0624   KETONESUR NEGATIVE 04/15/2021 0624   PROTEINUR 30 (A) 04/15/2021 0624   UROBILINOGEN 1.0 10/27/2013 2116   NITRITE POSITIVE (A) 04/15/2021 0624   LEUKOCYTESUR MODERATE (A) 04/15/2021 0624   Sepsis Labs Recent Labs  Lab 01/15/24 0039 01/17/24 1932 01/18/24 0415 01/19/24 0424  WBC 12.4* 11.0* 12.3* 6.8   Microbiology Recent Results (from the past 240 hours)  Resp panel by RT-PCR (RSV, Flu A&B, Covid) Anterior Nasal Swab     Status: None   Collection Time: 01/17/24  5:14 PM   Specimen: Anterior Nasal Swab  Result Value Ref Range Status   SARS Coronavirus 2 by RT PCR NEGATIVE NEGATIVE Final    Comment: (NOTE) SARS-CoV-2 target nucleic acids are NOT DETECTED.  The SARS-CoV-2 RNA is generally detectable in upper respiratory specimens during the acute phase of infection. The lowest concentration of SARS-CoV-2 viral copies this assay can detect is 138 copies/mL. A negative result does not preclude SARS-Cov-2 infection and should not be used as the sole basis for treatment or other patient management decisions. A negative result may occur with  improper specimen collection/handling, submission of specimen other than nasopharyngeal swab, presence of viral mutation(s) within the areas targeted by this assay, and inadequate number of viral copies(<138 copies/mL). A negative result must be combined with clinical observations, patient history, and epidemiological information. The expected result is Negative.  Fact Sheet  for Patients:  bloggercourse.com  Fact Sheet for Healthcare Providers:  seriousbroker.it  This test is no t yet approved or cleared by the [place]  FDA and  has been authorized for detection and/or diagnosis of SARS-CoV-2 by FDA under an Emergency Use Authorization (EUA). This EUA will remain  in effect (meaning this test can be used) for the duration of the COVID-19 declaration under Section 564(b)(1) of the Act, 21 U.S.C.section 360bbb-3(b)(1), unless the authorization is terminated  or revoked sooner.       Influenza A by PCR  NEGATIVE NEGATIVE Final   Influenza B by PCR NEGATIVE NEGATIVE Final    Comment: (NOTE) The Xpert Xpress SARS-CoV-2/FLU/RSV plus assay is intended as an aid in the diagnosis of influenza from Nasopharyngeal swab specimens and should not be used as a sole basis for treatment. Nasal washings and aspirates are unacceptable for Xpert Xpress SARS-CoV-2/FLU/RSV testing.  Fact Sheet for Patients: bloggercourse.com  Fact Sheet for Healthcare Providers: seriousbroker.it  This test is not yet approved or cleared by the [place]  FDA and has been authorized for detection and/or diagnosis of SARS-CoV-2 by FDA under an Emergency Use Authorization (EUA). This EUA will remain in effect (meaning this test can be used) for the duration of the COVID-19 declaration under Section 564(b)(1) of the Act, 21 U.S.C. section 360bbb-3(b)(1), unless the authorization is terminated or revoked.     Resp Syncytial Virus by PCR NEGATIVE NEGATIVE Final    Comment: (NOTE) Fact Sheet for Patients: bloggercourse.com  Fact Sheet for Healthcare Providers: seriousbroker.it  This test is not yet approved or cleared by the [place]  FDA and has been authorized for detection and/or diagnosis of SARS-CoV-2 by FDA under an  Emergency Use Authorization (EUA). This EUA will remain in effect (meaning this test can be used) for the duration of the COVID-19 declaration under Section 564(b)(1) of the Act, 21 U.S.C. section 360bbb-3(b)(1), unless the authorization is terminated or revoked.  Performed at [facility], [address], [place], [place] [address]   Culture, blood (Routine X 2) w Reflex to ID Panel     Status: None (Preliminary result)   Collection Time: 01/18/24  5:12 AM   Specimen: BLOOD RIGHT HAND  Result Value Ref Range Status   Specimen Description BLOOD RIGHT HAND BLOOD  Final   Special Requests   Final    Blood Culture results may not be optimal due to an inadequate volume of blood received in culture bottles AEROBIC BOTTLE ONLY   Culture   Final    NO GROWTH 2 DAYS Performed at [facility], [address] [place], [place] [address]    Report Status PENDING  Incomplete  Culture, blood (Routine X 2) w Reflex to ID Panel     Status: None (Preliminary result)   Collection Time: 01/18/24  5:21 AM   Specimen: BLOOD RIGHT FOREARM  Result Value Ref Range Status   Specimen Description BLOOD RIGHT FOREARM BLOOD  Final   Special Requests   Final    AEROBIC BOTTLE ONLY Blood Culture results may not be optimal due to an inadequate volume of blood received in culture bottles   Culture   Final    NO GROWTH 2 DAYS Performed at [facility], [address] [place], [place] [address]    Report Status PENDING  Incomplete   Time coordinating discharge:  44 mins   SIGNED:  Afton Louder, MD  [facility] 01/20/2024, 12:00 PM How to contact the [facility] Attending or Consulting provider 7A - 7P or covering provider during after hours 7P -7A, for this patient?  Check the care team in [facility] and look for a) attending/consulting TRH provider listed and b) the TRH team listed Log into [email or web address] and use [place]'s universal password to access. If you do not have the password, please contact the  hospital operator. Locate the TRH provider you are looking for under [facility] and page to a number that you can be directly reached. If you still have difficulty reaching the provider, please page the [facility] (Director on  Call) for the Hospitalists listed on amion for assistance.

## 2024-01-20 NOTE — TOC Transition Note (Signed)
 Transition of Care [facility]) - Discharge Note   Patient Details  Name: ASIA DUSENBURY MRN: [id number] Date of Birth: 09-Oct-1990  Transition of Care [facility]) CM/SW Contact:  Noreen KATHEE Pinal, LCSWA Phone Number: 01/20/2024, 12:03 PM   Clinical Narrative:     Patient is [facility] back to [facility] today. Attempted to reach significant other , but had to leave a confidential HIPAA VM. Debbie at [facility] provided CSW with room and report number and information was provided to nurse. Med necessity completed. EMS has been called.   Final next level of care: Long Term Nursing Home Barriers to Discharge: Barriers Resolved   Patient Goals and CMS Choice Patient states their goals for this hospitalization and ongoing recovery are:: to be independent CMS Medicare.gov Compare Post Acute Care list provided to:: Patient Choice offered to / list presented to : Patient      Discharge Placement              Patient chooses bed at:  [facility]) Patient to be transferred to facility by: [facility] Name of family member notified: Rahmel- left HIPPA VM Patient and family notified of of transfer: 01/20/24  Discharge Plan and Services Additional resources added to the After Visit Summary for   In-house Referral: Clinical Social Work Discharge Planning Services: NA Post Acute Care Choice: Nursing Home, Skilled Nursing Facility          DME Arranged: N/A DME Agency: NA                  Social Drivers of Health (SDOH) Interventions SDOH Screenings   Food Insecurity: No Food Insecurity (01/18/2024)  Housing: Low Risk  (01/18/2024)  Transportation Needs: No Transportation Needs (01/18/2024)  Utilities: Not At Risk (01/18/2024)  Tobacco Use: Low Risk  (01/17/2024)     Readmission Risk Interventions    01/20/2024   12:00 PM 01/19/2024    8:45 AM 04/28/2021    4:01 PM  Readmission Risk Prevention Plan  Transportation Screening Complete Complete Complete  PCP or Specialist Appt within 3-5 Days  Complete    HRI or Home Care Consult Complete Complete   Social Work Consult for Recovery Care Planning/Counseling Complete Complete   Palliative Care Screening Not Applicable Not Applicable   Medication Review Oceanographer) Complete Complete Complete  PCP or Specialist appointment within 3-5 days of discharge   Complete  HRI or Home Care Consult   Complete  SW Recovery Care/Counseling Consult   Complete  Palliative Care Screening   Not Applicable  Skilled Nursing Facility   Complete

## 2024-01-20 NOTE — NC FL2 (Signed)
 [place]  MEDICAID FL2 LEVEL OF CARE FORM     IDENTIFICATION  Patient Name: Carla Little Birthdate: 1990-06-01 Sex: female Admission Date (Current Location): 01/17/2024  [place] and [place] Number:  Raynaldo [id number] L Facility and Address:  [facility],  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 01/14/2024   Rupture of posterior cruciate ligament of left knee 01/14/2024   Acute medial meniscus tear of left knee 01/14/2024   Injury of posterolateral corner of left knee 01/14/2024   Patellar instability of left knee 01/14/2024   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   Gait abnormality 03/12/2023   GERD (gastroesophageal reflux disease) 03/06/2023   Acute peptic ulcer of stomach 03/06/2023   Pain in left ankle and joints of left foot 03/27/2022   Anemia    Elevated LFTs    Transaminasemia    Morbid obesity with BMI of 60.0-69.9, adult (HCC) 04/21/2021   Acute respiratory failure with hypoxia (HCC)    Pneumonia due to COVID-19 virus 04/12/2021   Hypomagnesemia 04/12/2021   Guillain Barr syndrome 12/21/2020   PUD (peptic ulcer disease) 12/09/2020   Class 3 obesity (HCC) 12/09/2020   Sinus tachycardia 12/09/2020   Depression    Iron  deficiency anemia due to chronic blood loss    Left knee dislocation 11/01/2020   Knee dislocation,  left, initial encounter 11/01/2020   Altered mental state 06/08/2017   Encephalopathy, portal systemic (HCC) 06/08/2017   CAP (community acquired pneumonia) 06/08/2017   Hypokalemia 01/14/2017   Acute cystitis without hematuria 01/14/2017   Cellulitis, trunk 01/14/2017    Orientation RESPIRATION BLADDER Height & Weight     Self, Time, Situation, Place  O2 (3L) Continent Weight: (!) 350 lb (158.8 kg) Height:  5' 4 (162.6 cm)  BEHAVIORAL SYMPTOMS/MOOD NEUROLOGICAL BOWEL NUTRITION STATUS      Continent Diet (Regular)  AMBULATORY STATUS COMMUNICATION OF NEEDS Skin   Extensive Assist Verbally Surgical wounds                       Personal Care Assistance Level of Assistance  Bathing, Feeding, Dressing Bathing Assistance: Maximum assistance Feeding assistance: Independent Dressing Assistance: Maximum assistance     Functional Limitations Info  Sight, Hearing, Speech Sight Info: Adequate Hearing Info: Adequate Speech Info: Adequate    SPECIAL CARE FACTORS FREQUENCY  PT (By licensed PT), OT (By licensed OT)     PT Frequency: 5 x a week OT Frequency: 5 x a week            Contractures Contractures Info: Not present    Additional Factors Info  Code Status, Allergies Code Status Info: FULL Allergies Info: Azithromycin            Current Medications (01/20/2024):  This is the current hospital active medication list Current Facility-Administered Medications  Medication Dose Route Frequency Provider Last Rate Last Admin   acetaminophen  (TYLENOL ) tablet  1,000 mg  1,000 mg Oral Q6H PRN Jesus America, NP   1,000 mg at 01/18/24 9747   buPROPion  (WELLBUTRIN  XL) 24 hr tablet 300 mg  300 mg Oral Daily Lue Elsie BROCKS, MD   300 mg at 01/20/24 1001   busPIRone  (BUSPAR ) tablet 20 mg  20 mg Oral TID Lue Elsie BROCKS, MD   20 mg at 01/20/24 1001   dextromethorphan -guaiFENesin  (MUCINEX  DM) 30-600 MG per 12 hr tablet 1 tablet  1 tablet Oral BID Adefeso, Oladapo, DO    1 tablet at 01/20/24 1001   diphenhydrAMINE  (BENADRYL ) capsule 50 mg  50 mg Oral Q6H PRN Vicci, Clanford L, MD   50 mg at 01/19/24 2231   doxepin (SINEQUAN) capsule 25 mg  25 mg Oral QHS Lue Elsie BROCKS, MD   25 mg at 01/19/24 2228   doxycycline (VIBRA-TABS) tablet 100 mg  100 mg Oral Q12H Johnson, Clanford L, MD       enoxaparin  (LOVENOX ) injection 80 mg  80 mg Subcutaneous Q24H Adefeso, Oladapo, DO   80 mg at 01/20/24 1003   feeding supplement (ENSURE PLUS HIGH PROTEIN) liquid 237 mL  237 mL Oral BID BM Adefeso, Oladapo, DO   237 mL at 01/20/24 1003   fluticasone (FLONASE) 50 MCG/ACT nasal spray 1 spray  1 spray Each Nare Daily Lue Elsie BROCKS, MD       hydrOXYzine  (ATARAX ) tablet 25 mg  25 mg Oral QHS Lue Elsie BROCKS, MD   25 mg at 01/19/24 2231   linaclotide  (LINZESS ) capsule 290 mcg  290 mcg Oral QAC breakfast Lue Elsie BROCKS, MD   290 mcg at 01/20/24 0749   naloxone (NARCAN) injection 0.4 mg  0.4 mg Intravenous PRN Jesus America, NP       ondansetron  (ZOFRAN ) tablet 4 mg  4 mg Oral Q6H PRN Adefeso, Oladapo, DO       Or   ondansetron  (ZOFRAN ) injection 4 mg  4 mg Intravenous Q6H PRN Adefeso, Oladapo, DO   4 mg at 01/19/24 1424   oxyCODONE  (Oxy IR/ROXICODONE ) immediate release tablet 10 mg  10 mg Oral Q6H PRN Jesus America, NP   10 mg at 01/20/24 0748   pantoprazole  (PROTONIX ) EC tablet 40 mg  40 mg Oral Daily Adefeso, Oladapo, DO   40 mg at 01/20/24 1001   piperacillin-tazobactam (ZOSYN) IVPB 3.375 g  3.375 g Intravenous Q8H Lue Elsie BROCKS, MD 12.5 mL/hr at 01/20/24 0755 3.375 g at 01/20/24 0755   polyethylene glycol (MIRALAX  / GLYCOLAX ) packet 17 g  17 g Oral BID PRN Lue Elsie BROCKS, MD       pregabalin  (LYRICA ) capsule 100 mg  100 mg Oral TID Lue Elsie BROCKS, MD   100 mg at 01/20/24 1000   pregabalin  (LYRICA ) capsule 25 mg  25 mg Oral TID Lue Elsie BROCKS, MD   25 mg at 01/20/24 1002   senna-docusate (Senokot-S) tablet 1 tablet  1 tablet Oral  QHS Lue Elsie BROCKS, MD   1 tablet at 01/19/24 2231   venlafaxine XR (EFFEXOR-XR) 24 hr capsule 225 mg  225 mg Oral Q breakfast Lue Elsie BROCKS, MD   225 mg at 01/20/24 9250     Discharge Medications: Please see discharge summary for a list of discharge medications.  Relevant Imaging Results:  Relevant Lab Results:   Additional Information SSN: [id number]  Noreen KATHEE Pinal, [place]

## 2024-01-22 ENCOUNTER — Encounter (HOSPITAL_COMMUNITY): Payer: Self-pay | Admitting: Orthopaedic Surgery

## 2024-01-23 ENCOUNTER — Encounter (HOSPITAL_COMMUNITY): Payer: Self-pay | Admitting: Orthopaedic Surgery

## 2024-01-23 LAB — CULTURE, BLOOD (ROUTINE X 2)
Culture: NO GROWTH
Culture: NO GROWTH

## 2024-01-24 ENCOUNTER — Ambulatory Visit (HOSPITAL_BASED_OUTPATIENT_CLINIC_OR_DEPARTMENT_OTHER): Admitting: Orthopaedic Surgery

## 2024-01-24 DIAGNOSIS — M23612 Other spontaneous disruption of anterior cruciate ligament of left knee: Secondary | ICD-10-CM

## 2024-01-24 NOTE — Progress Notes (Signed)
 Post Operative Evaluation    Procedure/Date of Surgery: Right knee ACL reconstruction, PCL reconstruction, posterior lateral counter repair, MPFL reconstruction  Interval History:   Presents 2 weeks status post the above procedure.  Overall she is doing very well.  She has unfortunately recently gone to the emergency room for pneumonia which is resolving.  She has been compliant with nonweightbearing   PMH/PSH/Family History/Social History/Meds/Allergies:    Past Medical History:  Diagnosis Date   Anxiety    Class 3 obesity (HCC) 12/09/2020   Depression    GERD (gastroesophageal reflux disease)    Guillain Barr syndrome    Nonalcoholic steatohepatitis (NASH) 12/09/2020   Obesity    PUD (peptic ulcer disease) 12/09/2020   Past Surgical History:  Procedure Laterality Date   BIOPSY  11/19/2020   Procedure: BIOPSY;  Surgeon: Kristie Lamprey, MD;  Location: [facility];  Service: Endoscopy;;   ESOPHAGOGASTRODUODENOSCOPY N/A 01/10/2021   normal   ESOPHAGOGASTRODUODENOSCOPY (EGD) WITH PROPOFOL  N/A 11/19/2020   LA Grade B reflux esophagitis, one small non-bleeding gastric ulcer in antrum s/p biopsy. Negative h.pylori.   FLEXIBLE SIGMOIDOSCOPY N/A 01/10/2021   Colonoscopy attempted Oct 2022 by Dr. Elicia but prep was poor. Stool in rectum and rectosigmoid colon.   MEDIAL PATELLOFEMORAL LIGAMENT REPAIR Left 01/14/2024   Procedure: RECONSTRUCTION, LIGAMENT, MEDIAL PATELLOFEMORAL;  Surgeon: Genelle Standing, MD;  Location: [facility];  Service: Orthopedics;  Laterality: Left;   Social History   Socioeconomic History   Marital status: Single    Spouse name: Not on file   Number of children: Not on file   Years of education: Not on file   Highest education level: Not on file  Occupational History   Not on file  Tobacco Use   Smoking status: Never    Passive exposure: Never   Smokeless tobacco: Never  Vaping Use   Vaping status: Never Used   Substance and Sexual Activity   Alcohol use: Not Currently   Drug use: No   Sexual activity: Not Currently  Other Topics Concern   Not on file  Social History Narrative   Not on file   Social Drivers of Health   Financial Resource Strain: Not on file  Food Insecurity: No Food Insecurity (01/18/2024)   Hunger Vital Sign    Worried About Running Out of Food in the Last Year: Never true    Ran Out of Food in the Last Year: Never true  Transportation Needs: No Transportation Needs (01/18/2024)   PRAPARE - Administrator, Civil Service (Medical): No    Lack of Transportation (Non-Medical): No  Physical Activity: Not on file  Stress: Not on file  Social Connections: Not on file   Family History  Problem Relation Age of Onset   Hypertension Other    Colon cancer Maternal Aunt    Colon polyps Neg Hx    Allergies  Allergen Reactions   Azithromycin  Shortness Of Breath and Nausea And Vomiting   Current Outpatient Medications  Medication Sig Dispense Refill   acetaminophen  (TYLENOL ) 325 MG tablet Take 2 tablets (650 mg total) by mouth every 6 (six) hours as needed for mild pain (or Fever >/= 101).     amoxicillin-clavulanate (AUGMENTIN) 875-125 MG tablet Take 1 tablet by mouth 2 (two) times daily for 4 days.  APPLE CIDER VINEGAR PO Take 2 each by mouth daily. [employer] Apple Cider Vinegar Gummies     ascorbic acid  (VITAMIN C) 500 MG tablet Take 500 mg by mouth daily.     buPROPion  (WELLBUTRIN  XL) 300 MG 24 hr tablet Take 300 mg by mouth daily.     busPIRone  (BUSPAR ) 10 MG tablet Take 20 mg by mouth 3 (three) times daily.     cholecalciferol  (VITAMIN D3) 25 MCG (1000 UNIT) tablet Take 2,000 Units by mouth daily.     cyanocobalamin  (,VITAMIN B-12,) 1000 MCG/ML injection Inject 1 mL (1,000 mcg total) into the muscle every 30 (thirty) days. 1 mL 0   doxepin (SINEQUAN) 25 MG capsule Take 25 mg by mouth at bedtime.     feeding supplement (ENSURE PLUS HIGH PROTEIN) LIQD Take 237  mLs by mouth 2 (two) times daily between meals.     fluticasone (FLONASE) 50 MCG/ACT nasal spray Place 1 spray into both nostrils daily.     folic acid  (FOLVITE ) 1 MG tablet Take 1 tablet (1 mg total) by mouth daily.     hydrOXYzine  (ATARAX ) 25 MG tablet Take 25 mg by mouth at bedtime.     ipratropium-albuterol  (DUONEB) 0.5-2.5 (3) MG/3ML SOLN Take 3 mLs by nebulization 3 (three) times daily.     lansoprazole (PREVACID) 30 MG capsule Take 30 mg by mouth daily.     linaclotide  (LINZESS ) 290 MCG CAPS capsule Take 1 capsule (290 mcg total) by mouth daily before breakfast. 30 capsule    loperamide (IMODIUM) 2 MG capsule Take 2 mg by mouth every 6 (six) hours as needed for diarrhea or loose stools.     Melatonin 5 MG CHEW Chew 10 mg by mouth at bedtime. [facility] Dreamy Sleep Gummies     methocarbamol  (ROBAXIN ) 500 MG tablet Take 1 tablet (500 mg total) by mouth every 6 (six) hours as needed for muscle spasms. 20 tablet 0   Multiple Vitamin (MULTIVITAMIN) capsule Take 1 capsule by mouth daily.     naloxone (NARCAN) nasal spray 4 mg/0.1 mL Place 1 spray into the nose once.     Nutritional Supplements (BRAIN SUPPORT PO) Take 3 each by mouth daily. [facility] Matcha Mind Cognitive Gummies     ondansetron  (ZOFRAN ) 4 MG tablet Take 4 mg by mouth every 8 (eight) hours as needed.     Oxycodone  HCl 10 MG TABS Take 1 tablet (10 mg total) by mouth every 6 (six) hours as needed (pain). 10 tablet 0   polyethylene glycol (MIRALAX  / GLYCOLAX ) 17 g packet Take 17 g by mouth 2 (two) times daily as needed for moderate constipation.     pregabalin  (LYRICA ) 100 MG capsule Take 100 mg by mouth 3 (three) times daily.     pregabalin  (LYRICA ) 25 MG capsule Take 25 mg by mouth 3 (three) times daily.     senna-docusate (SENOKOT-S) 8.6-50 MG tablet Take 1 tablet by mouth at bedtime.     Venlafaxine HCl 225 MG TB24 Take 1 tablet by mouth daily.     No current facility-administered medications for this visit.   No results  found.  Review of Systems:   A ROS was performed including pertinent positives and negatives as documented in the HPI.   Musculoskeletal Exam:    There were no vitals taken for this visit. Left knee incisions are well-appearing with some redness and irritation from the sutures but no evidence of infection.  Patella is stable on exam distal neurosensory exams intact  Imaging:  I personally reviewed and interpreted the radiographs.   Assessment:   2 weeks status post left knee multiligamentous knee reconstruction overall doing well.  At this time she will continue to be nonweightbearing for additional 2 weeks.  At that time she may progress to partial weightbearing 50% I will plan to see her back in 4 weeks for reassessment  Plan :    - Return to clinic 4 weeks for reassessment      I personally saw and evaluated the patient, and participated in the management and treatment plan.  Elspeth Parker, MD Attending Physician, Orthopedic Surgery  This document was dictated using Dragon voice recognition software. A reasonable attempt at proof reading has been made to minimize errors.

## 2024-01-25 ENCOUNTER — Encounter (HOSPITAL_COMMUNITY): Payer: Self-pay | Admitting: Orthopaedic Surgery

## 2024-01-27 ENCOUNTER — Encounter: Payer: Self-pay | Admitting: Radiology

## 2024-01-27 ENCOUNTER — Encounter (HOSPITAL_BASED_OUTPATIENT_CLINIC_OR_DEPARTMENT_OTHER): Payer: Self-pay | Admitting: Physical Therapy

## 2024-01-29 ENCOUNTER — Emergency Department (HOSPITAL_COMMUNITY)

## 2024-01-29 ENCOUNTER — Encounter (HOSPITAL_COMMUNITY): Payer: Self-pay

## 2024-01-29 ENCOUNTER — Other Ambulatory Visit: Payer: Self-pay

## 2024-01-29 ENCOUNTER — Inpatient Hospital Stay (HOSPITAL_COMMUNITY)
Admission: EM | Admit: 2024-01-29 | Discharge: 2024-02-06 | DRG: 603 | Disposition: A | Discharge status: Skilled Nursing Facility | Source: Skilled Nursing Facility | Attending: Internal Medicine | Admitting: Internal Medicine

## 2024-01-29 ENCOUNTER — Inpatient Hospital Stay (HOSPITAL_COMMUNITY)

## 2024-01-29 DIAGNOSIS — F32A Depression, unspecified: Secondary | ICD-10-CM | POA: Diagnosis present

## 2024-01-29 DIAGNOSIS — I959 Hypotension, unspecified: Secondary | ICD-10-CM | POA: Diagnosis present

## 2024-01-29 DIAGNOSIS — Z881 Allergy status to other antibiotic agents status: Secondary | ICD-10-CM

## 2024-01-29 DIAGNOSIS — E872 Acidosis, unspecified: Secondary | ICD-10-CM | POA: Diagnosis present

## 2024-01-29 DIAGNOSIS — Z8711 Personal history of peptic ulcer disease: Secondary | ICD-10-CM | POA: Diagnosis not present

## 2024-01-29 DIAGNOSIS — Z800 Family history of malignant neoplasm of digestive organs: Secondary | ICD-10-CM

## 2024-01-29 DIAGNOSIS — S83512D Sprain of anterior cruciate ligament of left knee, subsequent encounter: Secondary | ICD-10-CM | POA: Diagnosis not present

## 2024-01-29 DIAGNOSIS — I471 Supraventricular tachycardia, unspecified: Secondary | ICD-10-CM | POA: Diagnosis not present

## 2024-01-29 DIAGNOSIS — D638 Anemia in other chronic diseases classified elsewhere: Secondary | ICD-10-CM | POA: Diagnosis present

## 2024-01-29 DIAGNOSIS — E66813 Obesity, class 3: Secondary | ICD-10-CM | POA: Diagnosis present

## 2024-01-29 DIAGNOSIS — K2100 Gastro-esophageal reflux disease with esophagitis, without bleeding: Secondary | ICD-10-CM | POA: Diagnosis not present

## 2024-01-29 DIAGNOSIS — R946 Abnormal results of thyroid function studies: Secondary | ICD-10-CM | POA: Diagnosis present

## 2024-01-29 DIAGNOSIS — Z1152 Encounter for screening for COVID-19: Secondary | ICD-10-CM

## 2024-01-29 DIAGNOSIS — G4736 Sleep related hypoventilation in conditions classified elsewhere: Secondary | ICD-10-CM | POA: Diagnosis present

## 2024-01-29 DIAGNOSIS — S83105D Unspecified dislocation of left knee, subsequent encounter: Secondary | ICD-10-CM | POA: Diagnosis not present

## 2024-01-29 DIAGNOSIS — Z8249 Family history of ischemic heart disease and other diseases of the circulatory system: Secondary | ICD-10-CM | POA: Diagnosis not present

## 2024-01-29 DIAGNOSIS — S83519A Sprain of anterior cruciate ligament of unspecified knee, initial encounter: Secondary | ICD-10-CM | POA: Diagnosis present

## 2024-01-29 DIAGNOSIS — L03116 Cellulitis of left lower limb: Secondary | ICD-10-CM | POA: Diagnosis present

## 2024-01-29 DIAGNOSIS — Z6841 Body Mass Index (BMI) 40.0 and over, adult: Secondary | ICD-10-CM | POA: Diagnosis not present

## 2024-01-29 DIAGNOSIS — J9611 Chronic respiratory failure with hypoxia: Secondary | ICD-10-CM | POA: Diagnosis present

## 2024-01-29 DIAGNOSIS — K7581 Nonalcoholic steatohepatitis (NASH): Secondary | ICD-10-CM | POA: Diagnosis present

## 2024-01-29 DIAGNOSIS — G6181 Chronic inflammatory demyelinating polyneuritis: Secondary | ICD-10-CM | POA: Diagnosis present

## 2024-01-29 DIAGNOSIS — R7989 Other specified abnormal findings of blood chemistry: Secondary | ICD-10-CM | POA: Diagnosis not present

## 2024-01-29 DIAGNOSIS — S83105A Unspecified dislocation of left knee, initial encounter: Secondary | ICD-10-CM | POA: Diagnosis present

## 2024-01-29 DIAGNOSIS — K219 Gastro-esophageal reflux disease without esophagitis: Secondary | ICD-10-CM | POA: Diagnosis present

## 2024-01-29 DIAGNOSIS — T447X5A Adverse effect of beta-adrenoreceptor antagonists, initial encounter: Secondary | ICD-10-CM | POA: Diagnosis present

## 2024-01-29 DIAGNOSIS — Z79899 Other long term (current) drug therapy: Secondary | ICD-10-CM | POA: Diagnosis not present

## 2024-01-29 LAB — CBC
HCT: 36.4 % (ref 36.0–46.0)
Hemoglobin: 10.6 g/dL — ABNORMAL LOW (ref 12.0–15.0)
MCH: 27 pg (ref 26.0–34.0)
MCHC: 29.1 g/dL — ABNORMAL LOW (ref 30.0–36.0)
MCV: 92.9 fL (ref 80.0–100.0)
Platelets: 396 K/uL (ref 150–400)
RBC: 3.92 MIL/uL (ref 3.87–5.11)
RDW: 17.5 % — ABNORMAL HIGH (ref 11.5–15.5)
WBC: 6.5 K/uL (ref 4.0–10.5)
nRBC: 0 % (ref 0.0–0.2)

## 2024-01-29 LAB — BASIC METABOLIC PANEL WITH GFR
Anion gap: 9 (ref 5–15)
BUN: 5 mg/dL — ABNORMAL LOW (ref 6–20)
CO2: 31 mmol/L (ref 22–32)
Calcium: 8.2 mg/dL — ABNORMAL LOW (ref 8.9–10.3)
Chloride: 102 mmol/L (ref 98–111)
Creatinine, Ser: 0.58 mg/dL (ref 0.44–1.00)
GFR, Estimated: >60 mL/min (ref >60)
Glucose, Bld: 112 mg/dL — ABNORMAL HIGH (ref 70–99)
Potassium: 4 mmol/L (ref 3.5–5.1)
Sodium: 141 mmol/L (ref 135–145)

## 2024-01-29 LAB — D-DIMER, QUANTITATIVE: D-Dimer, Quant: 2.87 ug{FEU}/mL — ABNORMAL HIGH (ref 0.00–0.50)

## 2024-01-29 LAB — LACTIC ACID, PLASMA
Lactic Acid, Venous: 2.1 mmol/L (ref 0.5–1.9)
Lactic Acid, Venous: 2.3 mmol/L (ref 0.5–1.9)

## 2024-01-29 MED ORDER — DOXEPIN HCL 25 MG PO CAPS
25.0000 mg | ORAL_CAPSULE | Freq: Every day | ORAL | Status: DC
  Administered 2024-01-29 – 2024-02-05 (×8): 25 mg via ORAL
  Filled 2024-01-29 (×10): qty 1

## 2024-01-29 MED ORDER — ALPRAZOLAM 0.5 MG PO TABS
0.5000 mg | ORAL_TABLET | Freq: Once | ORAL | Status: AC
  Administered 2024-01-29: 0.5 mg via ORAL
  Filled 2024-01-29: qty 1

## 2024-01-29 MED ORDER — IOHEXOL 350 MG/ML SOLN
75.0000 mL | Freq: Once | INTRAVENOUS | Status: AC | PRN
  Administered 2024-01-29: 75 mL via INTRAVENOUS

## 2024-01-29 MED ORDER — CYANOCOBALAMIN 1000 MCG/ML IJ SOLN: 1000.0000 ug | INTRAMUSCULAR | Status: DC

## 2024-01-29 MED ORDER — LUBIPROSTONE 24 MCG PO CAPS: 24.0000 ug | ORAL_CAPSULE | Freq: Two times a day (BID) | ORAL | Status: DC

## 2024-01-29 MED ORDER — PREGABALIN 75 MG PO CAPS
100.0000 mg | ORAL_CAPSULE | Freq: Three times a day (TID) | ORAL | Status: DC
  Administered 2024-01-29 – 2024-02-06 (×24): 100 mg via ORAL
  Filled 2024-01-29 (×2): qty 1
  Filled 2024-01-29: qty 2
  Filled 2024-01-29 (×11): qty 1
  Filled 2024-01-29: qty 2
  Filled 2024-01-29 (×9): qty 1

## 2024-01-29 MED ORDER — VENLAFAXINE HCL ER 75 MG PO CP24
225.0000 mg | ORAL_CAPSULE | Freq: Every day | ORAL | Status: DC
  Administered 2024-01-30 – 2024-02-06 (×8): 225 mg via ORAL
  Filled 2024-01-29: qty 6
  Filled 2024-01-29 (×9): qty 3

## 2024-01-29 MED ORDER — RIVAROXABAN 10 MG PO TABS
10.0000 mg | ORAL_TABLET | Freq: Every day | ORAL | Status: DC
  Administered 2024-01-29 – 2024-02-05 (×8): 10 mg via ORAL
  Filled 2024-01-29 (×10): qty 1

## 2024-01-29 MED ORDER — MIDAZOLAM HCL 2 MG/2ML IJ SOLN
INTRAMUSCULAR | Status: AC
Start: 2024-01-29 — End: 2024-01-29
  Administered 2024-01-29: 2 mg via INTRAVENOUS
  Filled 2024-01-29: qty 2

## 2024-01-29 MED ORDER — BUSPIRONE HCL 5 MG PO TABS
20.0000 mg | ORAL_TABLET | Freq: Three times a day (TID) | ORAL | Status: DC
  Administered 2024-01-29 – 2024-02-06 (×24): 20 mg via ORAL
  Filled 2024-01-29 (×24): qty 4

## 2024-01-29 MED ORDER — SODIUM CHLORIDE 0.9% FLUSH
10.0000 mL | Freq: Two times a day (BID) | INTRAVENOUS | Status: DC
  Administered 2024-01-30 – 2024-02-06 (×15): 10 mL

## 2024-01-29 MED ORDER — METHOCARBAMOL 500 MG PO TABS
500.0000 mg | ORAL_TABLET | Freq: Four times a day (QID) | ORAL | Status: DC | PRN
Start: 2024-01-29 — End: 2024-02-06
  Administered 2024-01-30 – 2024-02-01 (×3): 500 mg via ORAL
  Filled 2024-01-29 (×3): qty 1

## 2024-01-29 MED ORDER — OXYCODONE HCL 5 MG PO TABS
10.0000 mg | ORAL_TABLET | Freq: Four times a day (QID) | ORAL | Status: DC | PRN
  Administered 2024-01-29 – 2024-02-06 (×15): 10 mg via ORAL
  Filled 2024-01-29 (×17): qty 2

## 2024-01-29 MED ORDER — BUPROPION HCL ER (XL) 150 MG PO TB24
300.0000 mg | ORAL_TABLET | Freq: Every day | ORAL | Status: DC
  Administered 2024-01-30 – 2024-02-06 (×8): 300 mg via ORAL
  Filled 2024-01-29 (×8): qty 2

## 2024-01-29 MED ORDER — ACETAMINOPHEN 325 MG PO TABS
650.0000 mg | ORAL_TABLET | Freq: Four times a day (QID) | ORAL | Status: DC | PRN
  Administered 2024-02-03: 650 mg via ORAL
  Filled 2024-01-29: qty 2

## 2024-01-29 MED ORDER — CEFAZOLIN SODIUM-DEXTROSE 2-4 GM/100ML-% IV SOLN
2.0000 g | Freq: Three times a day (TID) | INTRAVENOUS | Status: DC
  Administered 2024-01-30 – 2024-02-06 (×22): 2 g via INTRAVENOUS
  Filled 2024-01-29 (×22): qty 100

## 2024-01-29 MED ORDER — LINACLOTIDE 145 MCG PO CAPS
290.0000 ug | ORAL_CAPSULE | Freq: Every day | ORAL | Status: DC
  Administered 2024-01-30 – 2024-02-06 (×7): 290 ug via ORAL
  Filled 2024-01-29 (×10): qty 2

## 2024-01-29 MED ORDER — HYDROXYZINE HCL 25 MG PO TABS
25.0000 mg | ORAL_TABLET | Freq: Every day | ORAL | Status: DC
  Administered 2024-01-29 – 2024-02-01 (×4): 25 mg via ORAL
  Filled 2024-01-29 (×4): qty 1

## 2024-01-29 MED ORDER — PANTOPRAZOLE SODIUM 40 MG PO TBEC: 40.0000 mg | DELAYED_RELEASE_TABLET | Freq: Every day | ORAL | Status: DC

## 2024-01-29 MED ORDER — POLYETHYLENE GLYCOL 3350 17 G PO PACK
17.0000 g | PACK | Freq: Two times a day (BID) | ORAL | Status: DC | PRN
Start: 2024-01-29 — End: 2024-02-06

## 2024-01-29 MED ORDER — CEFAZOLIN SODIUM-DEXTROSE 2-4 GM/100ML-% IV SOLN
2.0000 g | Freq: Once | INTRAVENOUS | Status: AC
  Administered 2024-01-29: 2 g via INTRAVENOUS
  Filled 2024-01-29: qty 100

## 2024-01-29 MED ORDER — SODIUM CHLORIDE 0.9% FLUSH: 10.0000 mL | INTRAVENOUS | Status: DC | PRN

## 2024-01-29 MED ORDER — MIDAZOLAM HCL (PF) 2 MG/2ML IJ SOLN: 2.0000 mg | Freq: Once | INTRAMUSCULAR | Status: AC

## 2024-01-29 MED ORDER — MELATONIN 3 MG PO TABS
3.0000 mg | ORAL_TABLET | Freq: Every evening | ORAL | Status: DC | PRN
  Administered 2024-02-01 – 2024-02-04 (×2): 3 mg via ORAL
  Filled 2024-01-29 (×2): qty 1

## 2024-01-29 MED ORDER — CHLORHEXIDINE GLUCONATE CLOTH 2 % EX PADS
6.0000 | MEDICATED_PAD | Freq: Every day | CUTANEOUS | Status: DC
  Administered 2024-01-31 – 2024-02-06 (×7): 6 via TOPICAL

## 2024-01-29 MED ORDER — FOLIC ACID 1 MG PO TABS
1.0000 mg | ORAL_TABLET | Freq: Every day | ORAL | Status: DC
  Administered 2024-01-30 – 2024-02-06 (×8): 1 mg via ORAL
  Filled 2024-01-29 (×8): qty 1

## 2024-01-29 NOTE — ED Notes (Signed)
 MD aware of no IV access at this time. Awaiting PICC Placement.

## 2024-01-29 NOTE — ED Triage Notes (Signed)
 Patient BIB [facility] from [facility], for complaint of left knee, was having drainage, declines in oxygenation Facility had started neb treatments. EMS noted Room air o2 of 90% patient put on 3L nasal cannula o2 come up to 96%. Stated has been having nausea since yesterday.  Denise shortness of breath. Was recently discharged 10/27 from hospital.

## 2024-01-29 NOTE — Progress Notes (Signed)
 Pt did not have any usable vessels on right or left arm.  They were to deep or too small. Dr Sherlon aware. Did obtain 22g and blood drawn.

## 2024-01-29 NOTE — H&P (Addendum)
 [facility] H&P   Patient Demographics:    Carla Little, is a 33 y.o. female  MRN: [id number]   DOB - 20-Nov-1990  Admit Date - 01/29/2024  Outpatient Primary MD for the patient is Patient, No Pcp Per   Outpatient Specialists: ortho Dr Genelle    Patient coming from: [facility]  Chief Complaint  Patient presents with   Shortness of Breath   Knee Pain    left      HPI:    Carla Little  is a 33 y.o. female, ith medical history significant of GERD, NASH, CIDP, depression, morbid obesity , recent right knee arthroscopy with anterior and posterior cruciate ligament reconstruction with allograft 01/14/2024 by Dr. Gonzella, and another hospital stay from 10/24 till 10/27 for pneumonia. - Patient sent from facility due to complaints of shortness of breath and knee pain, patient finished her antibiotic course few days ago, but she was noted to have some erythema to her left knee yesterday and her p.o. antibiotics were resumed, as well she does report dyspnea, but she denies any chest pain, unsure of any fevers, during recent hospitalization she was discharged on 3 L nasal cannula which seems to be stable, so she was sent to ED for further evaluation. - ED her workup significant for normal white blood cell count, but elevated lactic acid at 2.1, x-ray of the left knee with no acute findings, but given concern of erythema she was started on IV cefazolin , her D-dimers were elevated at 2.5, left lower extremity venous Dopplers were negative, CTA chest still pending given she is a difficult access and required central line insertion for CTA chest with PE protocol, ED discussed with orthopedic at [facility] who reported bradycardia evaluate patient when she gets to [facility].    Review of systems:     A full 10 point Review of Systems was done, except as stated  above, all other Review of Systems were negative.   With Past History of the following :    Past Medical History:  Diagnosis Date   Anxiety    Class 3 obesity (HCC) 12/09/2020   Depression    GERD (gastroesophageal reflux disease)    Guillain Barr syndrome    Nonalcoholic steatohepatitis (NASH) 12/09/2020   Obesity    PUD (peptic ulcer disease) 12/09/2020      Past Surgical History:  Procedure Laterality Date   BIOPSY  11/19/2020   Procedure: BIOPSY;  Surgeon: Kristie Lamprey, MD;  Location: [facility];  Service: Endoscopy;;   ESOPHAGOGASTRODUODENOSCOPY N/A 01/10/2021   normal   ESOPHAGOGASTRODUODENOSCOPY (EGD) WITH PROPOFOL  N/A 11/19/2020   LA Grade B reflux esophagitis, one small non-bleeding gastric ulcer in antrum s/p biopsy. Negative h.pylori.   FLEXIBLE SIGMOIDOSCOPY N/A 01/10/2021   Colonoscopy attempted Oct 2022 by Dr. Elicia but prep was poor. Stool in rectum and rectosigmoid colon.  MEDIAL PATELLOFEMORAL LIGAMENT REPAIR Left 01/14/2024   Procedure: RECONSTRUCTION, LIGAMENT, MEDIAL PATELLOFEMORAL;  Surgeon: Genelle Standing, MD;  Location: [facility];  Service: Orthopedics;  Laterality: Left;      Social History:     Social History   Tobacco Use   Smoking status: Never    Passive exposure: Never   Smokeless tobacco: Never  Substance Use Topics   Alcohol use: Not Currently       Family History :     Family History  Problem Relation Age of Onset   Hypertension Other    Colon cancer Maternal Aunt    Colon polyps Neg Hx       Home Medications:   Prior to Admission medications   Medication Sig Start Date End Date Taking? Authorizing Provider  acetaminophen  (TYLENOL ) 325 MG tablet Take 2 tablets (650 mg total) by mouth every 6 (six) hours as needed for mild pain (or Fever >/= 101). 01/26/21  Yes Jens Durand, MD  ascorbic acid  (VITAMIN C) 500 MG tablet Take 500 mg by mouth daily.   Yes [provider]  buPROPion  (WELLBUTRIN  XL) 300 MG 24 hr  tablet Take 300 mg by mouth daily. 12/23/23  Yes [provider]  busPIRone  (BUSPAR ) 10 MG tablet Take 20 mg by mouth 3 (three) times daily.   Yes [provider]  cholecalciferol  (VITAMIN D3) 25 MCG (1000 UNIT) tablet Take 2,000 Units by mouth in the morning and at bedtime.   Yes [provider]  cyanocobalamin  (,VITAMIN B-12,) 1000 MCG/ML injection Inject 1 mL (1,000 mcg total) into the muscle every 30 (thirty) days. 01/29/21  Yes Jens Durand, MD  doxepin (SINEQUAN) 25 MG capsule Take 25 mg by mouth at bedtime. 01/06/24  Yes [provider]  doxycycline (MONODOX) 100 MG capsule Take 100 mg by mouth 2 (two) times daily. 01/28/24 02/04/24 Yes [provider]  fluticasone (FLONASE) 50 MCG/ACT nasal spray Place 1 spray into both nostrils daily. 12/26/23  Yes [provider]  folic acid  (FOLVITE ) 1 MG tablet Take 1 tablet (1 mg total) by mouth daily. 01/27/21  Yes Jens Durand, MD  hydrOXYzine  (ATARAX ) 25 MG tablet Take 25 mg by mouth at bedtime.   Yes [provider]  ipratropium-albuterol  (DUONEB) 0.5-2.5 (3) MG/3ML SOLN Take 3 mLs by nebulization 3 (three) times daily. 01/20/24  Yes Johnson, Clanford L, MD  LACTOBACILLUS PO Take 1 capsule by mouth daily. 01/29/24 02/11/24 Yes [provider]  linaclotide  (LINZESS ) 290 MCG CAPS capsule Take 1 capsule (290 mcg total) by mouth daily before breakfast. 04/18/21  Yes Memon, Jehanzeb, MD  loperamide (IMODIUM) 2 MG capsule Take 2 mg by mouth every 6 (six) hours as needed for diarrhea or loose stools.   Yes [provider]  methocarbamol  (ROBAXIN ) 500 MG tablet Take 1 tablet (500 mg total) by mouth every 6 (six) hours as needed for muscle spasms. 11/28/20  Yes Leotis Bogus, MD  Multiple Vitamin (MULTIVITAMINS PO) Take 1 tablet by mouth daily.   Yes [provider]  mupirocin ointment (BACTROBAN) 2 % Apply 1 Application topically 2 (two) times daily. Left knee incision  sites for cellulitis 01/28/24 02/04/24 Yes [provider]  naloxone (NARCAN) nasal spray 4 mg/0.1 mL Place 1 spray into the nose once as needed (opioid overdose).   Yes [provider]  nystatin  (MYCOSTATIN /NYSTOP ) powder Apply 1 Application topically 2 (two) times daily. Clean and dry completely, groin and abd fold then apply powder generously to affected areas 01/23/24  01/30/24 Yes [provider]  omeprazole (PRILOSEC) 20 MG capsule Take 20 mg by mouth 2 (two) times daily before a meal.   Yes [provider]  ondansetron  (ZOFRAN ) 4 MG tablet Take 4 mg by mouth every 8 (eight) hours as needed for nausea. 01/17/24  Yes [provider]  Oxycodone  HCl 10 MG TABS Take 1 tablet (10 mg total) by mouth every 6 (six) hours as needed (pain). 01/20/24  Yes Johnson, Clanford L, MD  polyethylene glycol (MIRALAX  / GLYCOLAX ) 17 g packet Take 17 g by mouth 2 (two) times daily.   Yes [provider]  pregabalin  (LYRICA ) 100 MG capsule Take 100 mg by mouth 3 (three) times daily. 0000, 0800, 1600 12/30/23  Yes [provider]  pregabalin  (LYRICA ) 25 MG capsule Take 25 mg by mouth 3 (three) times daily. 0000, 0800, 1600 12/30/23  Yes [provider]  senna-docusate (SENOKOT-S) 8.6-50 MG tablet Take 1 tablet by mouth at bedtime. 01/20/24  Yes Johnson, Clanford L, MD  Venlafaxine HCl 225 MG TB24 Take 1 tablet by mouth daily. 01/11/24  Yes [provider]     Allergies:     Allergies  Allergen Reactions   Azithromycin  Shortness Of Breath and Nausea And Vomiting     Physical Exam:   Vitals  Blood pressure 112/71, pulse 96, temperature 98.2 F (36.8 C), temperature source Oral, resp. rate 12, height 5' 4 (1.626 m), weight (!) 158.8 kg, SpO2 92%.   1. General , patient lying in bed in NAD,   2. Normal affect and insight, Not Suicidal or Homicidal, Awake Alert, Oriented X 3.  3. No F.N deficits, ALL C.Nerves Intact, Strength 5/5  all 4 extremities, Sensation intact all 4 extremities, Plantars down going.  4. Ears and Eyes appear Normal, Conjunctivae clear, PERRLA.   5. Supple Neck  6. Symmetrical Chest wall movement, clear to auscultation, but diminished bowel sounds due to body habitus.  7. RRR, No Gallops, Rubs or Murmurs  8. Positive Bowel Sounds, Abdomen Soft, No tenderness, No organomegaly appriciated,No rebound -guarding or rigidity.  9.  No Cyanosis, Normal Skin Turgor, No Skin Rash or Bruise.  10.  Left knee with recent postop wounds, mild erythema     Data Review:    CBC Recent Labs  Lab 01/29/24 1236  WBC 6.5  HGB 10.6*  HCT 36.4  PLT 396  MCV 92.9  MCH 27.0  MCHC 29.1*  RDW 17.5*   ------------------------------------------------------------------------------------------------------------------  Chemistries  Recent Labs  Lab 01/29/24 1236  NA 141  K 4.0  CL 102  CO2 31  GLUCOSE 112*  BUN 5*  CREATININE 0.58  CALCIUM  8.2*   ------------------------------------------------------------------------------------------------------------------ estimated creatinine clearance is 149.2 mL/min (by C-G formula based on SCr of 0.58 mg/dL). ------------------------------------------------------------------------------------------------------------------ No results for input(s): TSH, T4TOTAL, T3FREE, THYROIDAB in the last 72 hours.  Invalid input(s): FREET3  Coagulation profile No results for input(s): INR, PROTIME in the last 168 hours. ------------------------------------------------------------------------------------------------------------------- Recent Labs    01/29/24 1610  DDIMER 2.87*   -------------------------------------------------------------------------------------------------------------------  Cardiac Enzymes No results for input(s): CKMB, TROPONINI, MYOGLOBIN in the last 168 hours.  Invalid input(s):  CK ------------------------------------------------------------------------------------------------------------------ No results found for: BNP   ---------------------------------------------------------------------------------------------------------------  Urinalysis    Component Value Date/Time   COLORURINE AMBER (A) 04/15/2021 0624   APPEARANCEUR HAZY (A) 04/15/2021 0624   LABSPEC 1.018 04/15/2021 0624   PHURINE 7.0 04/15/2021 0624   GLUCOSEU NEGATIVE 04/15/2021 0624   HGBUR MODERATE (A) 04/15/2021 0624   BILIRUBINUR  NEGATIVE 04/15/2021 0624   KETONESUR NEGATIVE 04/15/2021 0624   PROTEINUR 30 (A) 04/15/2021 0624   UROBILINOGEN 1.0 10/27/2013 2116   NITRITE POSITIVE (A) 04/15/2021 0624   LEUKOCYTESUR MODERATE (A) 04/15/2021 0624    ----------------------------------------------------------------------------------------------------------------   Imaging Results:    US  Venous Img Lower Unilateral Left Result Date: 01/29/2024 CLINICAL DATA:  Left lower extremity pain, knee surgery 2 weeks ago EXAM: LEFT LOWER EXTREMITY VENOUS DOPPLER ULTRASOUND TECHNIQUE: Gray-scale sonography with compression, as well as color and duplex ultrasound, were performed to evaluate the deep venous system(s) from the level of the common femoral vein through the popliteal and proximal calf veins. COMPARISON:  01/18/2024 FINDINGS: VENOUS Normal compressibility of the common femoral, superficial femoral, and popliteal veins, as well as the visualized calf veins. Visualized portions of profunda femoral vein and great saphenous vein unremarkable. No filling defects to suggest DVT on grayscale or color Doppler imaging. Doppler waveforms show normal direction of venous flow, normal respiratory plasticity and response to augmentation. Limited views of the contralateral common femoral vein are unremarkable. OTHER None. Limitations: Body habitus. IMPRESSION: 1. No evidence of deep venous thrombosis within the left  lower extremity. Evaluation is limited due to body habitus. Electronically Signed   By: Ozell Daring M.D.   On: 01/29/2024 17:15   DG Knee Complete 4 Views Left Result Date: 01/29/2024 CLINICAL DATA:  Left knee pain with drainage. EXAM: LEFT KNEE - COMPLETE 4+ VIEW COMPARISON:  12/21/2023. FINDINGS: Recent medial patellofemoral ligament repair 01/14/2024. Image quality is degraded by body habitus. Lateral view fails to image the knee joint entirely, making assessment for effusion difficult. Slight lateral subluxation of the proximal tibia with respect to the distal femur. IMPRESSION: 1. Suboptimal image quality due to body habitus and incomplete imaging of the knee joint on the lateral view. 2. Recent medial patellofemoral ligament repair. 3. No acute findings. Electronically Signed   By: Newell Eke M.D.   On: 01/29/2024 13:43   DG Chest Port 1 View Result Date: 01/29/2024 CLINICAL DATA:  Shortness of breath. EXAM: PORTABLE CHEST 1 VIEW COMPARISON:  01/17/2024 and CT chest 01/17/2024. FINDINGS: Trachea is midline. Heart size stable. Lungs are low in volume but clear. No pleural fluid. Elevated right hemidiaphragm. IMPRESSION: No acute findings. Electronically Signed   By: Newell Eke M.D.   On: 01/29/2024 13:32   US  EKG SITE RITE Result Date: 01/29/2024 If Site Rite image not attached, placement could not be confirmed due to current cardiac rhythm.      Assessment & Plan:    Principal Problem:   Cellulitis of left knee Active Problems:   Left knee dislocation   GERD (gastroesophageal reflux disease)   Depression   Morbid obesity with BMI of 60.0-69.9, adult (HCC)   ACL tear  Left knee pain/cellulitis Recent knee arthroscopy with  ACL  - She is having erythema at the site. -Continue with IV cefazolin  - Will be admitted to [facility] for evaluation by orthopedic team. - High risk for DVT/poor ambulatory postop, so she will need to be on DVT prophylaxis either p.o.  Xarelto or Eliquis dose but for now I will start p.o. Xarelto prophylaxis dose pending her CTA PE protocol  Chronic respiratory failure with hypoxia. Elevated  D-dimers - Patient was recently started on 3 L nasal cannula which appears to be at her baseline currently. - Currently treated for pneumonia as well. - X-ray with no acute findings. - D-dimer is elevated, her left lower extremity venous Dopplers is  negative for DVT, will obtain CTA chest to rule out PE   Recent left hip ACL reconstruction surgery Patient recently had multiligamentous repair of L knee -Left ACL reconstruction, left PCL reconstruction, left medial meniscal debridement, left medial patellofemoral ligament reconstruction, left knee posterior lateral corner repair. -Continue pain regimen. - Will consult PT/OT -ORthopedic to evaluate patient once at [facility] to evaluate for cellulitis and further post operative recommendations   GERD Continue PPI   Morbid obesity  - Body mass index is 60.08 kg/m.     DVT Prophylaxis will start on p.o. Xarelto dose.  AM Labs Ordered, also please review Full Orders  Family Communication: Admission, patients condition and plan of care including tests being ordered have been discussed with the patient  who indicate understanding and agree with the plan and Code Status.  Code Status full code  Likely DC to back to SNF  Consults called: ortho by ED    Admission status: inpatient    Time spent in minutes : 75 miutes   Brayton Lye M.D on 01/29/2024 at 8:52 PM   [facility] - Office  [phone]

## 2024-01-29 NOTE — ED Notes (Signed)
Pt taken to ct. nad 

## 2024-01-29 NOTE — ED Notes (Signed)
 ED provider at beside attempting to get second IV access.

## 2024-01-29 NOTE — ED Provider Notes (Signed)
 Discussed with Dr. Jerri from orthopedics who felt that she could likely be treated for cellulitis at [facility] if she can move her knee.  D-dimer elevated.  CTA ordered.  DVT ultrasound pending at this point in time.  6:51 PM DVT ultrasound without evidence of DVT.  Did attempt ultrasound-guided IV that will not flush.  Discussed alternative options for IV access with the patient and she is decided to go ahead with an IJ central line.   8:32 PM Central line placed.  Confirmatory x-ray ordered.  Patient will be taken for CTA. Dr Sherlon from hospitalist to admit the patient to [facility].   [facility]ABRA[facility]ound ED Peripheral IV (Provider)  Date/Time: 01/29/2024 6:52 PM  Performed by: [name], MD Authorized by: [name], MD   Procedure details:    Indications: multiple failed IV attempts and poor IV access     Skin Prep: chlorhexidine  gluconate     Location:  Right AC   Angiocath:  20 G   Bedside Ultrasound Guided: Yes     Images: not archived     Patient tolerated procedure without complications: Yes     Dressing applied: Yes   Comments:     Attempted ultrasound IV without success. Central Line  Date/Time: 01/29/2024 8:32 PM  Performed by: [name], MD Authorized by: [name], MD   Consent:    Consent obtained:  Written   Consent given by:  Patient   Risks, benefits, and alternatives were discussed: yes     Risks discussed:  Arterial puncture, incorrect placement, nerve damage, bleeding, infection and pneumothorax   Alternatives discussed:  Alternative treatment Universal protocol:    Patient identity confirmed:  Verbally with patient and arm band Pre-procedure details:    Indication(s): central venous access and insufficient peripheral access     Hand hygiene: Hand hygiene performed prior to insertion     Sterile barrier technique: All elements of maximal sterile technique followed     Skin preparation:  Chlorhexidine  with alcohol   Skin  preparation agent: Skin preparation agent completely dried prior to procedure   Sedation:    Sedation type:  Anxiolysis Anesthesia:    Anesthesia method:  Local infiltration   Local anesthetic:  Lidocaine  1% w/o epi Procedure details:    Location:  R internal jugular   Patient position:  Trendelenburg   Procedural supplies:  Triple lumen   Catheter size:  7 Fr   Landmarks identified: yes     Ultrasound guidance: yes     Ultrasound guidance timing: real time     Sterile ultrasound techniques: Sterile gel and sterile probe covers were used     Number of attempts:  1   Successful placement: yes   Post-procedure details:    Post-procedure:  Dressing applied and line sutured   Assessment:  Blood return through all ports, no pneumothorax on x-ray, free fluid flow and placement verified by x-ray   Procedure completion:  Tolerated well, no immediate complications     [name], MD 01/29/24 2041

## 2024-01-29 NOTE — ED Notes (Addendum)
 Vascular team at [facility] made aware of PICC order. Awaiting someone to come place line to have access to patient. MD aware of status.

## 2024-01-29 NOTE — ED Notes (Signed)
 MD aware of not being able to get USIV.

## 2024-01-29 NOTE — ED Provider Notes (Signed)
 [facility] AT [facility] Provider Note   CSN: [id number] Arrival date & time: 01/29/24  1125     Patient presents with: Shortness of Breath and Knee Pain (left)   Carla Little is a 33 y.o. female.   HPI Patient presents from rehab facility where she has been recovering since knee surgery 2 weeks ago.  Recovery was complicated by pneumonia, she completed antibiotics few days ago, but after nurse practitioner at facility saw erythema about the knee yesterday patient restarted antibiotics. She is unsure of current fever, notes ongoing cough, denies pain anywhere beyond the knee.  Per EMS: Patient BIB [facility] from [facility], for complaint of left knee, was having drainage, declines in oxygenation Facility had started neb treatments. EMS noted Room air o2 of 90% patient put on 3L nasal cannula o2 come up to 96%. Stated has been having nausea since yesterday. Denise shortness of breath. Was recently discharged 10/27 from hospital.      Prior to Admission medications   Medication Sig Start Date End Date Taking? Authorizing Provider  acetaminophen  (TYLENOL ) 325 MG tablet Take 2 tablets (650 mg total) by mouth every 6 (six) hours as needed for mild pain (or Fever >/= 101). 01/26/21   Jens Durand, MD  APPLE CIDER VINEGAR PO Take 2 each by mouth daily. [facility] [employer]    [provider]  ascorbic acid  (VITAMIN C) 500 MG tablet Take 500 mg by mouth daily.    [provider]  buPROPion  (WELLBUTRIN  XL) 300 MG 24 hr tablet Take 300 mg by mouth daily. 12/23/23   [provider]  busPIRone  (BUSPAR ) 10 MG tablet Take 20 mg by mouth 3 (three) times daily.    [provider]  cholecalciferol  (VITAMIN D3) 25 MCG (1000 UNIT) tablet Take 2,000 Units by mouth daily.    [provider]  cyanocobalamin  (,VITAMIN B-12,) 1000 MCG/ML injection Inject 1 mL (1,000 mcg total) into the muscle every 30 (thirty) days.  01/29/21   Jens Durand, MD  doxepin (SINEQUAN) 25 MG capsule Take 25 mg by mouth at bedtime. 01/06/24   [provider]  feeding supplement (ENSURE PLUS HIGH PROTEIN) LIQD Take 237 mLs by mouth 2 (two) times daily between meals. 01/20/24   [facility], Clanford L, MD  fluticasone (FLONASE) 50 MCG/ACT nasal spray Place 1 spray into both nostrils daily. 12/26/23   [provider]  folic acid  (FOLVITE ) 1 MG tablet Take 1 tablet (1 mg total) by mouth daily. 01/27/21   Jens Durand, MD  hydrOXYzine  (ATARAX ) 25 MG tablet Take 25 mg by mouth at bedtime.    [provider]  ipratropium-albuterol  (DUONEB) 0.5-2.5 (3) MG/3ML SOLN Take 3 mLs by nebulization 3 (three) times daily. 01/20/24   [facility], Clanford L, MD  lansoprazole (PREVACID) 30 MG capsule Take 30 mg by mouth daily.    [provider]  linaclotide  (LINZESS ) 290 MCG CAPS capsule Take 1 capsule (290 mcg total) by mouth daily before breakfast. 04/18/21   Antoinette Doe, MD  loperamide (IMODIUM) 2 MG capsule Take 2 mg by mouth every 6 (six) hours as needed for diarrhea or loose stools.    [provider]  Melatonin 5 MG CHEW Chew 10 mg by mouth at bedtime. [facility] Dream[employer] Sleep Gummies    [provider]  methocarbamol  (ROBAXIN ) 500 MG tablet Take 1 tablet (500 mg total) by mouth every 6 (six) hours as needed for muscle spasms. 11/28/20   Leotis Bogus,  MD  Multiple Vitamin (MULTIVITAMIN) capsule Take 1 capsule by mouth daily.    [provider]  naloxone [facility]) nasal spray 4 mg/0.1 mL Place 1 spray into the nose once.    [provider]  Nutritional Supplements (BRAIN SUPPORT PO) Take 3 each by mouth daily. [facility] [employer] Gummies    [provider]  ondansetron  (ZOFRAN ) 4 MG tablet Take 4 mg by mouth every 8 (eight) hours as needed. 01/17/24   [provider]  Oxycodone  HCl 10 MG TABS Take 1 tablet (10 mg total) by mouth every 6 (six) hours as needed  (pain). 01/20/24   [facility], Clanford L, MD  polyethylene glycol (MIRALAX  / GLYCOLAX ) 17 g packet Take 17 g by mouth 2 (two) times daily as needed for moderate constipation.    [provider]  pregabalin  (LYRICA ) 100 MG capsule Take 100 mg by mouth 3 (three) times daily. 12/30/23   [provider]  pregabalin  (LYRICA ) 25 MG capsule Take 25 mg by mouth 3 (three) times daily. 12/30/23   [provider]  senna-docusate (SENOKOT-S) 8.6-50 MG tablet Take 1 tablet by mouth at bedtime. 01/20/24   Vicci Afton CROME, MD  Venlafaxine HCl 225 MG TB24 Take 1 tablet by mouth daily. 01/11/24   [provider]    Allergies: Azithromycin     Review of Systems  Updated Vital Signs BP 119/89   Pulse (!) 102   Temp 98.2 F (36.8 C) (Oral)   Resp 18   Ht 1.626 m (5' 4)   Wt (!) 158.8 kg   SpO2 100%   BMI 60.08 kg/m   Physical Exam Vitals and nursing note reviewed.  Constitutional:      General: She is not in acute distress.    Appearance: She is well-developed. She is obese. She is not ill-appearing or diaphoretic.  HENT:     Head: Normocephalic and atraumatic.  Eyes:     Conjunctiva/sclera: Conjunctivae normal.  Cardiovascular:     Rate and Rhythm: Normal rate and regular rhythm.  Pulmonary:     Effort: Pulmonary effort is normal. No respiratory distress.     Breath sounds: Normal breath sounds. No stridor.  Abdominal:     General: There is no distension.  Musculoskeletal:       Legs:  Skin:    General: Skin is warm and dry.  Neurological:     Mental Status: She is alert and oriented to person, place, and time.     Cranial Nerves: No cranial nerve deficit.  Psychiatric:        Mood and Affect: Mood normal.     (all labs ordered are listed, but only abnormal results are displayed) Labs Reviewed  BASIC METABOLIC PANEL WITH GFR - Abnormal; Notable for the following components:      Result Value   Glucose, Bld 112 (*)    BUN 5 (*)    Calcium  8.2  (*)    All other components within normal limits  CBC - Abnormal; Notable for the following components:   Hemoglobin 10.6 (*)    MCHC 29.1 (*)    RDW 17.5 (*)    All other components within normal limits  LACTIC ACID, PLASMA - Abnormal; Notable for the following components:   Lactic Acid, Venous 2.1 (*)    All other components within normal limits  LACTIC ACID, PLASMA    EKG: EKG Interpretation Date/Time:  Wednesday January 29 2024 11:46:13 EST Ventricular Rate:  96 PR Interval:  134 QRS Duration:  83 QT Interval:  348 QTC Calculation: 440 R Axis:   33  Text Interpretation: Sinus rhythm Low voltage, precordial leads Confirmed by Garrick Charleston [phone]) on 01/29/2024 12:57:50 PM  Radiology: ARCOLA Knee Complete 4 Views Left Result Date: 01/29/2024 CLINICAL DATA:  Left knee pain with drainage. EXAM: LEFT KNEE - COMPLETE 4+ VIEW COMPARISON:  12/21/2023. FINDINGS: Recent medial patellofemoral ligament repair 01/14/2024. Image quality is degraded by body habitus. Lateral view fails to image the knee joint entirely, making assessment for effusion difficult. Slight lateral subluxation of the proximal tibia with respect to the distal femur. IMPRESSION: 1. Suboptimal image quality due to body habitus and incomplete imaging of the knee joint on the lateral view. 2. Recent medial patellofemoral ligament repair. 3. No acute findings. Electronically Signed   By: Newell Eke M.D.   On: 01/29/2024 13:43   DG Chest Port 1 View Result Date: 01/29/2024 CLINICAL DATA:  Shortness of breath. EXAM: PORTABLE CHEST 1 VIEW COMPARISON:  01/17/2024 and CT chest 01/17/2024. FINDINGS: Trachea is midline. Heart size stable. Lungs are low in volume but clear. No pleural fluid. Elevated right hemidiaphragm. IMPRESSION: No acute findings. Electronically Signed   By: Newell Eke M.D.   On: 01/29/2024 13:32   US  EKG SITE RITE Result Date: 01/29/2024 If Site Rite image not attached, placement could not be confirmed  due to current cardiac rhythm.    Procedures   Medications Ordered in the ED  ceFAZolin  (ANCEF ) IVPB 2g/100 mL premix (has no administration in time range)                                    Medical Decision Making Morbidly obese adult female presents 3 weeks after knee surgery, now with concern for increased work of breathing, fever, possible recurrent pneumonia as well as knee infection. She is awake, alert, but with tachycardia, infection, meets SIRS criteria, concern for bacteremia, sepsis either pneumonia or postsurgical. Patient had labs x-rays. Initial evaluation delayed by lack of IV access.  Amount and/or Complexity of Data Reviewed External Data Reviewed: notes.    Details: Follow-up notes following surgery, pneumonia reviewed Labs: ordered. Decision-making details documented in ED Course. Radiology: ordered and independent interpretation performed. Decision-making details documented in ED Course.  Risk Prescription drug management. Decision regarding hospitalization. Diagnosis or treatment significantly limited by social determinants of health.   3:07 PM Patient's x-ray without obvious pneumonia, left knee unremarkable labs notable for mild lactic acidosis and given the patient's erythematous knee, mild tachycardia, mild hypotension, patient has started antibiotics, and I discussed her case with the physician assistant from her surgical team.  With concern for cellulitis in the post surgical phase, possibly complicated by the patient's body habitus, and pneumonia though this does not appear to be worsening on today's x-ray, the patient will be admitted, transferred to [facility] with anticipated consult from her surgical team, Ortho care.  Final diagnoses:  Cellulitis of knee, left     Garrick Charleston, MD 01/29/24 [phone]

## 2024-01-30 DIAGNOSIS — Z6841 Body Mass Index (BMI) 40.0 and over, adult: Secondary | ICD-10-CM

## 2024-01-30 DIAGNOSIS — S83105D Unspecified dislocation of left knee, subsequent encounter: Secondary | ICD-10-CM

## 2024-01-30 DIAGNOSIS — K2100 Gastro-esophageal reflux disease with esophagitis, without bleeding: Secondary | ICD-10-CM

## 2024-01-30 DIAGNOSIS — L03116 Cellulitis of left lower limb: Secondary | ICD-10-CM | POA: Diagnosis not present

## 2024-01-30 LAB — BASIC METABOLIC PANEL WITH GFR
Anion gap: 6 (ref 5–15)
BUN: <5 mg/dL — ABNORMAL LOW (ref 6–20)
CO2: 34 mmol/L — ABNORMAL HIGH (ref 22–32)
Calcium: 8.4 mg/dL — ABNORMAL LOW (ref 8.9–10.3)
Chloride: 101 mmol/L (ref 98–111)
Creatinine, Ser: 0.56 mg/dL (ref 0.44–1.00)
GFR, Estimated: >60 mL/min (ref >60)
Glucose, Bld: 79 mg/dL (ref 70–99)
Potassium: 3.7 mmol/L (ref 3.5–5.1)
Sodium: 141 mmol/L (ref 135–145)

## 2024-01-30 LAB — CBC
HCT: 35.6 % — ABNORMAL LOW (ref 36.0–46.0)
Hemoglobin: 10.6 g/dL — ABNORMAL LOW (ref 12.0–15.0)
MCH: 27.1 pg (ref 26.0–34.0)
MCHC: 29.8 g/dL — ABNORMAL LOW (ref 30.0–36.0)
MCV: 91 fL (ref 80.0–100.0)
Platelets: 459 K/uL — ABNORMAL HIGH (ref 150–400)
RBC: 3.91 MIL/uL (ref 3.87–5.11)
RDW: 17.3 % — ABNORMAL HIGH (ref 11.5–15.5)
WBC: 5.9 K/uL (ref 4.0–10.5)
nRBC: 0 % (ref 0.0–0.2)

## 2024-01-30 MED ORDER — SODIUM CHLORIDE 0.9 % IV SOLN: INTRAVENOUS | Status: AC

## 2024-01-30 NOTE — TOC Initial Note (Signed)
 Transition of Care [facility]) - Initial/Assessment Note    Patient Details  Name: Carla Little MRN: [id number] Date of Birth: [birth year]  Transition of [facility] CM/SW Contact:    Lucie Lunger, LCSWA Phone Number: 01/30/2024, 8:55 AM  Clinical Narrative:                 Pt is high risk for readmission and known to [facility] from past hospital admissions. Pt is a LTC resident at [facility]. Plan will be for pt to return to LTC at [facility] once medically stable. TOC to follow.   Expected Discharge Plan: Long Term Nursing Home Barriers to Discharge: Barriers Resolved   Patient Goals and CMS Choice Patient states their goals for this hospitalization and ongoing recovery are:: return to LTC CMS Medicare.gov Compare Post Acute Care list provided to:: Patient Choice offered to / list presented to : Patient      Expected Discharge Plan and Services In-house Referral: Clinical Social Work Discharge Planning Services: CM Consult Post Acute Care Choice: Skilled Nursing Facility, Nursing Home Living arrangements for the past 2 months: Skilled Nursing Facility                                      Prior Living Arrangements/Services Living arrangements for the past 2 months: Skilled Nursing Facility Lives with:: Facility Resident Patient language and need for interpreter reviewed:: Yes Do you feel safe going back to the place where you live?: Yes      Need for Family Participation in Patient Care: No (Comment) Care giver support system in place?: Yes (comment)   Criminal Activity/Legal Involvement Pertinent to Current Situation/Hospitalization: No - Comment as needed  Activities of Daily Living      Permission Sought/Granted                  Emotional Assessment Appearance:: Appears stated age Attitude/Demeanor/Rapport: Engaged Affect (typically observed): Accepting Orientation: : Oriented to Self, Oriented to Place, Oriented to  Time, Oriented to Situation Alcohol  / Substance Use: Not Applicable Psych Involvement: No (comment)  Admission diagnosis:  Cellulitis of left knee [L03.116] Patient Active Problem List   Diagnosis Date Noted   Cellulitis of left knee 01/29/2024   Hypoalbuminemia due to protein-calorie malnutrition 01/18/2024   Elevated d-dimer 01/18/2024   ACL tear 01/14/2024   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 01/14/2024   Rupture of posterior cruciate ligament of left knee 01/14/2024   Acute medial meniscus tear of left knee 01/14/2024   Injury of posterolateral corner of left knee 01/14/2024   Patellar instability of left knee 01/14/2024   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   Gait abnormality 03/12/2023   GERD (gastroesophageal reflux disease) 03/06/2023   Acute peptic ulcer of stomach 03/06/2023   Pain in left ankle and joints of left foot 03/27/2022   Anemia    Elevated LFTs    Transaminasemia    Morbid obesity with BMI of 60.0-69.9, adult (HCC) 04/21/2021   Acute respiratory failure with hypoxia (HCC)    Pneumonia due to COVID-19 virus 04/12/2021   Hypomagnesemia 04/12/2021   Guillain Barr syndrome 12/21/2020   PUD (peptic ulcer disease) 12/09/2020   Class 3 obesity (HCC) 12/09/2020   Sinus tachycardia 12/09/2020   Depression    Iron  deficiency anemia due to chronic blood loss    Left knee dislocation 11/01/2020   Knee dislocation, left,  initial encounter 11/01/2020   Altered mental state 06/08/2017   Encephalopathy, portal systemic (HCC) 06/08/2017   CAP (community acquired pneumonia) 06/08/2017   Hypokalemia 01/14/2017   Acute cystitis without hematuria 01/14/2017   Cellulitis, trunk 01/14/2017   PCP:  Patient, No Pcp Per Pharmacy:   [facility] - [place], [place] - [address] [place] [address] Phone: [phone] Fax: [phone]  [facility] - [place], [place] - [address] [place]  [place] [address] Phone: [phone] Fax: [phone]  [facility] - [place], [place] - [address] [place] [address] Phone: [phone] Fax: [phone]     Social Drivers of Health (SDOH) Social History: SDOH Screenings   Food Insecurity: No Food Insecurity (01/18/2024)  Housing: Low Risk  (01/18/2024)  Transportation Needs: No Transportation Needs (01/18/2024)  Utilities: Not At Risk (01/18/2024)  Tobacco Use: Low Risk  (01/29/2024)   SDOH Interventions:     Readmission Risk Interventions    01/30/2024    8:54 AM 01/20/2024   12:00 PM 01/19/2024    8:45 AM  Readmission Risk Prevention Plan  Transportation Screening Complete Complete Complete  PCP or Specialist Appt within 3-5 Days   Complete  HRI or Home Care Consult Complete Complete Complete  Social Work Consult for Recovery Care Planning/Counseling Complete Complete Complete  Palliative Care Screening Not Applicable Not Applicable Not Applicable  Medication Review Oceanographer) Complete Complete Complete

## 2024-01-30 NOTE — Progress Notes (Signed)
 Progress Note   Patient: Carla Little FMW:[id number] DOB: 05-08-1990 DOA: 01/29/2024     1 DOS: the patient was seen and examined on 01/30/2024   Brief hospital admission narrative: As per H&P written by Dr. Sherlon on 01/29/24 Carla Little  is a 33 y.o. female, ith medical history significant of GERD, NASH, CIDP, depression, morbid obesity , recent right knee arthroscopy with anterior and posterior cruciate ligament reconstruction with allograft 01/14/2024 by Dr. Gonzella, and another hospital stay from 10/24 till 10/27 for pneumonia. - Patient sent from facility due to complaints of shortness of breath and knee pain, patient finished her antibiotic course few days ago, but she was noted to have some erythema to her left knee yesterday and her p.o. antibiotics were resumed, as well she does report dyspnea, but she denies any chest pain, unsure of any fevers, during recent hospitalization she was discharged on 3 L nasal cannula which seems to be stable, so she was sent to ED for further evaluation. - ED her workup significant for normal white blood cell count, but elevated lactic acid at 2.1, x-ray of the left knee with no acute findings, but given concern of erythema she was started on IV cefazolin , her D-dimers were elevated at 2.5, left lower extremity venous Dopplers were negative, CTA chest still pending given she is a difficult access and required central line insertion for CTA chest with PE protocol, ED discussed with orthopedic at [facility] who reported bradycardia evaluate patient when she gets to [facility].  Assessment and plan 1-left knee pain/cellulitis/left knee ACL reconstruction surgery - Patient with recent knee arthroscopy with ACL - Reporting erythematous changes, pain and intermittent drainage. - Afebrile and with normal WBCs -Case discussed with orthopedic service who recommended transfer to [facility] for further evaluation and  management. - Continue to maintain adequate hydration and will continue IV antibiotics for now.  2-elevated D-dimers/obesity hypoventilation syndrome, obstructive sleep apnea and hypoxia - Patient recovering from healthcare associated pneumonia which she ended requiring 3 L nasal, supplementation to maintain saturations. - Most likely with component of obesity hypoventilation syndrome and also obstructive sleep apnea. - At time of evaluation 1-2 L in place. - Still pending sleep study as an outpatient - No wheezing, no crackles and no tachypnea appreciated at rest. - CT angiogram of the chest demonstrated no pulmonary embolism.  3-GERD - Continue PPI.  4-history of SVT - Patient reported using metoprolol  in the past, this was discontinued secondary to hypotension. - Might benefit of lower dose extended release beta-blocker. - Continue telemetry evaluation for now. -Maintain adequate hydration and make sure electrolytes are stable (potassium above 4 and magnesium  above 2). -will check TSH  5-morbid obesity -Body mass index is 60.08 kg/m.  -Low-calorie diet, portion control and increase physical activity as tolerated patient - Patient will benefit of outpatient referral to bariatric clinic.  6-DVT prophylaxis - Daily Xarelto has been started.   Subjective:  No chest pain, no nausea, no vomiting.  Afebrile.  Complaining of left lower extremity pain, swelling and erythematous changes.  Physical Exam: Vitals:   01/30/24 0320 01/30/24 0410 01/30/24 0430 01/30/24 0530  BP: (!) 132/96 105/85 102/76   Pulse: (!) 102 100 (!) 108 (!) 105  Resp: 17 16 18 16   Temp:      TempSrc:      SpO2: 97% 100% 100% 100%  Weight:      Height:       General exam: Alert,  awake, oriented x 3; in no distress. Respiratory system: Clear to auscultation. Respiratory effort normal. Cardiovascular system: Sinus tachycardia, no rubs, no gallops, no JVD appreciated on exam; limited evaluation secondary to  body habitus). Gastrointestinal system: Abdomen is obese, nondistended, soft and nontender. No organomegaly or masses felt. Normal bowel sounds heard. Central nervous system: Alert and oriented. No focal neurological deficits. Extremities: No cyanosis or clubbing. Skin: No petechiae; cellulitic process appreciated in her left lower extremity.  Please refer to images for details. Psychiatry: Judgement and insight appear normal. Mood & affect appropriate.    Data Reviewed: CBC: WBCs 5.9, hemoglobin 10.6, platelet count 459K Basic metabolic panel: Sodium 141, potassium 3.7, chloride 101, bicarb 34, BUN <5, creatinine 0.56 and GFR >60  Family Communication: No family at bedside.  Disposition: Status is: Inpatient Remains inpatient appropriate because: Continue IV antibiotics.  Anticipating discharge back to skilled nursing facility once medically stable.  Time spent: 50 minutes  Author: Eric Nunnery, MD 01/30/2024 7:14 AM  For on call review [email or web address].

## 2024-01-30 NOTE — Progress Notes (Signed)
 Ortho asked to evaluate new onset of LLE cellulitis.  Patient underwent complex left knee surgery on 01/14/24 by Dr. Genelle.  She presented to [facility] with LLE cellulitis.  Transferred to [facility] on hospitalist service. DVT/PE work up negative.  Based on my evaluation, the LLE appearance is consistent with cellulitis.  Surgical incisions are healing without drainage.  No signs of septic arthritis.  Recommend abx treatment of cellulitis per hospitalist.  Appreciate their assistance with this matter.  Ortho available if needed.  GEANNIE Ozell Cummins, MD [facility] 5:32 PM

## 2024-01-31 ENCOUNTER — Ambulatory Visit: Payer: Medicare (Managed Care) | Admitting: Neurology

## 2024-01-31 DIAGNOSIS — R7989 Other specified abnormal findings of blood chemistry: Secondary | ICD-10-CM

## 2024-01-31 DIAGNOSIS — I959 Hypotension, unspecified: Secondary | ICD-10-CM | POA: Diagnosis not present

## 2024-01-31 DIAGNOSIS — L03116 Cellulitis of left lower limb: Secondary | ICD-10-CM | POA: Diagnosis not present

## 2024-01-31 LAB — BASIC METABOLIC PANEL WITH GFR
Anion gap: 9 (ref 5–15)
BUN: <5 mg/dL — ABNORMAL LOW (ref 6–20)
CO2: 31 mmol/L (ref 22–32)
Calcium: 7.8 mg/dL — ABNORMAL LOW (ref 8.9–10.3)
Chloride: 100 mmol/L (ref 98–111)
Creatinine, Ser: 0.59 mg/dL (ref 0.44–1.00)
GFR, Estimated: >60 mL/min (ref >60)
Glucose, Bld: 107 mg/dL — ABNORMAL HIGH (ref 70–99)
Potassium: 3.5 mmol/L (ref 3.5–5.1)
Sodium: 140 mmol/L (ref 135–145)

## 2024-01-31 LAB — MAGNESIUM: Magnesium: 1.8 mg/dL (ref 1.7–2.4)

## 2024-01-31 LAB — TSH: TSH: 7.394 u[IU]/mL — ABNORMAL HIGH (ref 0.350–4.500)

## 2024-01-31 MED ORDER — MAGNESIUM OXIDE -MG SUPPLEMENT 400 (240 MG) MG PO TABS
400.0000 mg | ORAL_TABLET | Freq: Two times a day (BID) | ORAL | Status: AC
  Administered 2024-01-31 – 2024-02-01 (×4): 400 mg via ORAL
  Filled 2024-01-31 (×4): qty 1

## 2024-01-31 MED ORDER — LOPERAMIDE HCL 2 MG PO CAPS: 2.0000 mg | ORAL_CAPSULE | Freq: Four times a day (QID) | ORAL | Status: DC | PRN

## 2024-01-31 MED ORDER — POTASSIUM CHLORIDE CRYS ER 20 MEQ PO TBCR
40.0000 meq | EXTENDED_RELEASE_TABLET | Freq: Once | ORAL | Status: AC
  Administered 2024-01-31: 40 meq via ORAL
  Filled 2024-01-31: qty 2

## 2024-01-31 MED ORDER — ONDANSETRON HCL 4 MG/2ML IJ SOLN
4.0000 mg | Freq: Four times a day (QID) | INTRAMUSCULAR | Status: DC | PRN
  Administered 2024-02-03 – 2024-02-04 (×2): 4 mg via INTRAVENOUS
  Filled 2024-01-31 (×2): qty 2

## 2024-01-31 MED ORDER — PANTOPRAZOLE SODIUM 40 MG PO TBEC
40.0000 mg | DELAYED_RELEASE_TABLET | Freq: Every day | ORAL | Status: DC
  Administered 2024-01-31 – 2024-02-06 (×7): 40 mg via ORAL
  Filled 2024-01-31 (×7): qty 1

## 2024-01-31 MED ORDER — SODIUM CHLORIDE 0.9 % IV BOLUS
500.0000 mL | Freq: Once | INTRAVENOUS | Status: AC
  Administered 2024-01-31: 500 mL via INTRAVENOUS

## 2024-01-31 NOTE — Evaluation (Signed)
 Physical Therapy Evaluation Patient Details Name: Carla Little MRN: [id number] DOB: 06/28/1990 Today's Date: 01/31/2024  History of Present Illness  Pt is a 33 y.o. female admitted 01/29/24 for LLE cellulitis. 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 Of note, recent hospitalization 10/24-27 for PNA.    Clinical Impression  Pt admitted with above diagnosis. PTA, pt was modI-supervision with bed mobility and required assistance with transfers and gait. Pt reports she has been practicing sit<>stands with PT requiring 1-2+ assist using bariatric RW. She resides at [facility]. Pt currently with functional limitations due to the deficits listed below (see PT Problem List). She performed rolling and supine>sit with supervision and required maxA x2 for sit>supine. Pt attempted to stand from raised bed height, but was unable to clear hips. Pt will benefit from acute skilled PT to increase her independence and safety with mobility to allow discharge. Recommend continued inpatient follow up therapy, <3 hours/day.    If plan is discharge home, recommend the following: Two people to help with walking and/or transfers;A lot of help with bathing/dressing/bathroom;Assistance with cooking/housework;Assist for transportation;Help with stairs or ramp for entrance   Can travel by private vehicle   No    Equipment Recommendations None recommended by PT  Recommendations for Other Services       Functional Status Assessment Patient has had a recent decline in their functional status and demonstrates the ability to make significant improvements in function in a reasonable and predictable amount of  time.     Precautions / Restrictions Precautions Precautions: Fall Recall of Precautions/Restrictions: Impaired Restrictions LLE Weight Bearing Per Provider Order: Non weight bearing      Mobility  Bed Mobility Overal bed mobility: Needs Assistance Bed Mobility: Rolling, Supine to Sit, Sit to Supine Rolling: Used rails, Supervision   Supine to sit: HOB elevated, Used rails, Supervision Sit to supine: Max assist, +2 for physical assistance   General bed mobility comments: Pt rolled R/L with use of bed rail for bed pads to be repositioned. She sat up on L side of bed with the use of momentum bringing BLE off EOB and pushing up trunk by pulling on rails. Returning to bed PT/OT managed BLE. Repositioned using bed features and pt pushing with RLE and pulling with BUE.    Transfers Overall transfer level: Needs assistance Equipment used: Rolling walker (2 wheels) Transfers: Sit to/from Stand Sit to Stand: From elevated surface, Max assist, +2 physical assistance           General transfer comment: Pt attempted to stand from a raised bed height using bari RW. She verablized needing to maintain LLE NWB. PT/OT provided maxA and utilized bed pad to help pt power up. She was unable to clear hips and c/o feeling like her R foot was slipping depsite the grippy socks and anterior foot block.    Ambulation/Gait                  Stairs            Wheelchair Mobility     Tilt Bed    Modified Rankin (Stroke Patients Only)       Balance Overall balance assessment: Needs assistance Sitting-balance support: Feet supported, Bilateral upper extremity supported Sitting balance-Leahy Scale: Fair Sitting balance - Comments:  Pt sat EOB with close supervision.                                     Pertinent Vitals/Pain Pain Assessment Pain Assessment: 0-10 Pain Score: 6  Pain Location: LLE Pain Descriptors / Indicators: Grimacing, Discomfort, Aching,  Sore Pain Intervention(s): Monitored during session, Limited activity within patient's tolerance, Repositioned    Home Living Family/patient expects to be discharged to:: Skilled nursing facility                   Additional Comments: LTC resident at [facility]    Prior Function Prior Level of Function : Needs assist;History of Falls (last six months)       Physical Assist : Mobility (physical);ADLs (physical) Mobility (physical): Transfers;Gait ADLs (physical): Toileting;Dressing;IADLs Mobility Comments: Prior to surgery in October, pt was indep with bed mobility and would stand pivot turn to transfer without AD into w/c which should could self-propel. Pt reports working with PT following surgery on static stance using RW. She states it varies between 1-2+ assist. ADLs Comments: Prior to surgery in October, pt was bathing in shower with supervision to transfer into/out of the bathroom. Pt wears breifs and requires assist for pericare. Staff maange all meals and medications.     Extremity/Trunk Assessment   Upper Extremity Assessment Upper Extremity Assessment: Defer to OT evaluation    Lower Extremity Assessment Lower Extremity Assessment: Generalized weakness;LLE deficits/detail LLE Deficits / Details: Decreased hip, knee, and ankle AROM. Some limitations secondary to body habitus, but most attributed to multiligamentous injury repair. Grossly 2/5 at hip/knee and 3/5 at ankle for strength. LLE: Unable to fully assess due to pain LLE Sensation: WNL LLE Coordination: decreased gross motor    Cervical / Trunk Assessment Cervical / Trunk Assessment: Other exceptions Cervical / Trunk Exceptions: Increased Body Habitus  Communication   Communication Communication: No apparent difficulties    Cognition Arousal: Alert Behavior During Therapy: WFL for tasks assessed/performed   PT - Cognitive impairments: No apparent impairments                        PT - Cognition Comments: Pt A,Ox4 Following commands: Intact       Cueing Cueing Techniques: Verbal cues, Gestural cues     General Comments General comments (skin integrity, edema, etc.): VSS on 2L O2 via [place]    Exercises     Assessment/Plan    PT Assessment Patient needs continued PT services  PT Problem List Decreased strength;Decreased range of motion;Decreased activity tolerance;Decreased balance;Decreased mobility;Pain       PT Treatment Interventions DME instruction;Functional mobility training;Therapeutic activities;Therapeutic exercise;Balance training;Patient/family education;Wheelchair mobility training    PT Goals (Current goals can be found in the Care Plan section)  Acute Rehab PT Goals Patient Stated Goal: Get back to standing and advance to pivoting in order to transfer PT Goal Formulation: With patient Time For Goal Achievement: 02/14/24 Potential to Achieve Goals: Fair    Frequency Min 2X/week     Co-evaluation PT/OT/SLP Co-Evaluation/Treatment: Yes Reason for Co-Treatment: For patient/therapist safety;To address functional/ADL transfers PT goals addressed during session: Mobility/safety with mobility;Balance;Proper use of DME         AM-PAC PT 6 Clicks Mobility  Outcome Measure Help needed turning from your back to your side while in a flat bed without using bedrails?: A Little Help needed moving from lying  on your back to sitting on the side of a flat bed without using bedrails?: Total Help needed moving to and from a bed to a chair (including a wheelchair)?: Total Help needed standing up from a chair using your arms (e.g., wheelchair or bedside chair)?: Total Help needed to walk in hospital room?: Total Help needed climbing 3-5 steps with a railing? : Total 6 Click Score: 8    End of Session Equipment Utilized During Treatment: Oxygen Activity Tolerance: Patient tolerated treatment well;Patient limited by pain Patient left: in bed;with  call bell/phone within reach Nurse Communication: Mobility status;Need for lift equipment PT Visit Diagnosis: Unsteadiness on feet (R26.81);Other abnormalities of gait and mobility (R26.89);Muscle weakness (generalized) (M62.81);Pain Pain - Right/Left: Left Pain - part of body: Knee;Leg;Ankle and joints of foot    Time: 9155-9087 PT Time Calculation (min) (ACUTE ONLY): 28 min   Charges:   PT Evaluation $PT Eval Moderate Complexity: 1 Mod   PT General Charges $$ ACUTE PT VISIT: 1 Visit         Randall SAUNDERS, PT, DPT Acute Rehabilitation Services Office: [phone] Secure Chat Preferred  Carla Little 01/31/2024, 10:39 AM

## 2024-01-31 NOTE — Progress Notes (Signed)
 [facility] PROGRESS NOTE   Carla Little FMW:[id number] DOB: 07-Jun-1990 DOA: 01/29/2024  PCP: Patient, No Pcp Per  Brief History: 33 y.o. female, with medical history significant of GERD, NASH, CIDP, depression, morbid obesity, recent right knee arthroscopy with anterior and posterior cruciate ligament reconstruction with allograft 01/14/2024 by Dr. Gonzella, and another hospital stay from 10/24 till 10/27 for pneumonia. Patient sent from facility due to complaints of shortness of breath and knee pain, patient finished her antibiotic course few days ago, but she was noted to have some erythema to her left knee and her p.o. antibiotics were resumed, as well she does report dyspnea, but she denies any chest pain, unsure of any fevers, during recent hospitalization she was discharged on 3 L nasal cannula which seems to be stable, so she was sent to ED for further evaluation.  She was hospitalized for further management of cellulitis.  Consultants: Orthopedics  Procedures: None    Subjective/Interval History: Patient complains of some pain in the left lower extremity.  The redness that is present is new and appeared 2 to 3 days ago.  Denies any chest pain shortness of breath.  Has been nonweightbearing due to recent surgery.    Assessment/Plan:  Left lower extremity cellulitis This is in the setting of recent left knee surgery for ACL reconstruction.  Seen by orthopedics with no concern for septic arthritis.  No concern for incision site infection. Ultrasound did not show any DVT. Patient was started on IV antibiotics.  She is noted to be on cefazolin .  Continue for now. She is noted to be afebrile.  WBC was noted to be normal.  Recent left knee surgery Stable per orthopedics.  Continue Xarelto for DVT prophylaxis.  Elevated D-dimer Nonspecific.  No DVT or PE identified.  Could be from acute infection.  Recent HCAP/obesity hypoventilation syndrome/obstructive sleep  apnea/hypoxia Recent hospitalization for pneumonia noted.  Was on oxygen at her facility.  Currently on room air.  Monitor saturations closely. CT angiogram did not show any PE. Respiratory status noted to be stable.  Hypotension Borderline low blood pressures noted.  She is asymptomatic.  Not on any blood pressure lowering agents.  With fluid bolus.  May need midodrine.  Elevated TSH Will check free T3 and free T4.  History of SVT Mild sinus tachycardia noted.  Otherwise asymptomatic.  Beta-blocker on hold due to low blood pressure.  Morbid obesity Estimated body mass index is 60.08 kg/m as calculated from the following:   Height as of this encounter: 5' 4 (1.626 m).   Weight as of this encounter: 158.8 kg.  DVT Prophylaxis: On Xarelto Code Status: Full code Family Communication: Discussed with patient Disposition Plan: Hopefully return back to facility when improved  Status is: Inpatient Remains inpatient appropriate because: Left lower extremity cellulitis requiring IV antibiotics      Medications: Scheduled:  buPROPion   300 mg Oral Daily   busPIRone   20 mg Oral TID   Chlorhexidine  Gluconate Cloth  6 each Topical Daily   [START ON 02/20/2024] cyanocobalamin   1,000 mcg Intramuscular Q30 days   doxepin  25 mg Oral QHS   folic acid   1 mg Oral Daily   hydrOXYzine   25 mg Oral QHS   linaclotide   290 mcg Oral QAC breakfast   pantoprazole   40 mg Oral Daily   pregabalin   100 mg Oral TID   rivaroxaban  10 mg Oral Daily   sodium chloride  flush  10-40 mL Intracatheter Q12H   venlafaxine  XR  225 mg Oral Daily   Continuous:   ceFAZolin  (ANCEF ) IV 2 g (01/31/24 0632)   sodium chloride      PRN:acetaminophen , loperamide, melatonin, methocarbamol , ondansetron  (ZOFRAN ) IV, oxyCODONE , polyethylene glycol, sodium chloride  flush  Antibiotics: Anti-infectives (From admission, onward)    Start     Dose/Rate Route Frequency Ordered Stop   01/30/24 0000  ceFAZolin  (ANCEF ) IVPB  2g/100 mL premix        2 g 200 mL/hr over 30 Minutes Intravenous Every 8 hours 01/29/24 2138     01/29/24 1415  ceFAZolin  (ANCEF ) IVPB 2g/100 mL premix        2 g 200 mL/hr over 30 Minutes Intravenous  Once 01/29/24 1412 01/29/24 1746       Objective:  Vital Signs  Vitals:   01/30/24 2111 01/31/24 0230 01/31/24 0827 01/31/24 0827  BP: (!) 109/53 100/68 (!) 89/52 (!) 89/52  Pulse: (!) 108 (!) 114  (!) 114  Resp: 20 16 17 17   Temp: 98 F (36.7 C) 98.1 F (36.7 C) 98 F (36.7 C) 98 F (36.7 C)  TempSrc: Oral Oral Oral Oral  SpO2: 98% 93% 100% 99%  Weight:      Height:        Intake/Output Summary (Last 24 hours) at 01/31/2024 0909 Last data filed at 01/30/2024 1817 Gross per 24 hour  Intake 946.12 ml  Output --  Net 946.12 ml   Filed Weights   01/29/24 1136  Weight: (!) 158.8 kg    General appearance: Awake alert.  In no distress Resp: Clear to auscultation bilaterally.  Normal effort Cardio: S1-S2 is normal regular.  No S3-S4.  No rubs murmurs or bruit GI: Abdomen is soft.  Nontender nondistended.  Bowel sounds are present normal.  No masses organomegaly Extremities: Erythema involving the left lower extremity extending from the knee up to the ankle.  Warm to touch.  No areas of fluctuation.  No active drainage noted from her incision sites. Physical deconditioning is present.  No focal neurological deficits.   Lab Results:  Data Reviewed: I have personally reviewed following labs and reports of the imaging studies  CBC: Recent Labs  Lab 01/29/24 1236 01/30/24 0449  WBC 6.5 5.9  HGB 10.6* 10.6*  HCT 36.4 35.6*  MCV 92.9 91.0  PLT 396 459*    Basic Metabolic Panel: Recent Labs  Lab 01/29/24 1236 01/30/24 0449 01/31/24 0205  NA 141 141 140  K 4.0 3.7 3.5  CL 102 101 100  CO2 31 34* 31  GLUCOSE 112* 79 107*  BUN 5* <5* <5*  CREATININE 0.58 0.56 0.59  CALCIUM  8.2* 8.4* 7.8*  MG  --   --  1.8    GFR: Estimated Creatinine Clearance: 149.2  mL/min (by C-G formula based on SCr of 0.59 mg/dL).  Thyroid  Function Tests: Recent Labs    01/31/24 0205  TSH 7.394*     Radiology Studies: CT Angio Chest PE W and/or Wo Contrast Result Date: 01/29/2024 CLINICAL DATA:  Low oxygenation. EXAM: CT ANGIOGRAPHY CHEST WITH CONTRAST TECHNIQUE: Multidetector CT imaging of the chest was performed using the standard protocol during bolus administration of intravenous contrast. Multiplanar CT image reconstructions and MIPs were obtained to evaluate the vascular anatomy. RADIATION DOSE REDUCTION: This exam was performed according to the departmental dose-optimization program which includes automated exposure control, adjustment of the mA and/or kV according to patient size and/or use of iterative reconstruction technique. CONTRAST:  75mL OMNIPAQUE  IOHEXOL  350 MG/ML SOLN COMPARISON:  January 17, 2024 FINDINGS: Cardiovascular: The pulmonary arteries are limited in evaluation secondary to suboptimal opacification with intravenous contrast and overlying areas of artifact related to the patient's body habitus. No definite evidence of pulmonary embolism. Normal heart size. No pericardial effusion. Mediastinum/Nodes: No enlarged mediastinal, hilar, or axillary lymph nodes. Thyroid  gland, trachea, and esophagus demonstrate no significant findings. Lungs/Pleura: Mild a ridge of linear scarring and/or atelectasis are seen within the posterior aspects of the bilateral upper lobes and posterolateral bilateral lower lobes. No acute infiltrate, pleural effusion or pneumothorax is identified. Upper Abdomen: No acute abnormality. Musculoskeletal: No chest wall abnormality. No acute or significant osseous findings. Review of the MIP images confirms the above findings. IMPRESSION: 1. Limited evaluation of the pulmonary arteries without definite evidence of pulmonary embolism. 2. Mild bilateral upper lobe and bilateral lower lobe linear scarring and/or atelectasis. Electronically  Signed   By: Suzen Dials M.D.   On: 01/29/2024 21:44   DG Chest Portable 1 View Result Date: 01/29/2024 CLINICAL DATA:  Central line placement. EXAM: PORTABLE CHEST 1 VIEW COMPARISON:  Chest radiograph dated 01/29/2024. FINDINGS: Right IJ central venous line with tip in the region of the cavoatrial junction. Shallow inspiration. No focal consolidation, pleural effusion, pneumothorax. Mild cardiomegaly with mild central vascular congestion. No acute osseous pathology. IMPRESSION: Right IJ central venous line with tip in the region of the cavoatrial junction. No pneumothorax. Electronically Signed   By: Vanetta Chou M.D.   On: 01/29/2024 20:52   US  Venous Img Lower Unilateral Left Result Date: 01/29/2024 CLINICAL DATA:  Left lower extremity pain, knee surgery 2 weeks ago EXAM: LEFT LOWER EXTREMITY VENOUS DOPPLER ULTRASOUND TECHNIQUE: Gray-scale sonography with compression, as well as color and duplex ultrasound, were performed to evaluate the deep venous system(s) from the level of the common femoral vein through the popliteal and proximal calf veins. COMPARISON:  01/18/2024 FINDINGS: VENOUS Normal compressibility of the common femoral, superficial femoral, and popliteal veins, as well as the visualized calf veins. Visualized portions of profunda femoral vein and great saphenous vein unremarkable. No filling defects to suggest DVT on grayscale or color Doppler imaging. Doppler waveforms show normal direction of venous flow, normal respiratory plasticity and response to augmentation. Limited views of the contralateral common femoral vein are unremarkable. OTHER None. Limitations: Body habitus. IMPRESSION: 1. No evidence of deep venous thrombosis within the left lower extremity. Evaluation is limited due to body habitus. Electronically Signed   By: Ozell Daring M.D.   On: 01/29/2024 17:15   DG Knee Complete 4 Views Left Result Date: 01/29/2024 CLINICAL DATA:  Left knee pain with drainage. EXAM: LEFT  KNEE - COMPLETE 4+ VIEW COMPARISON:  12/21/2023. FINDINGS: Recent medial patellofemoral ligament repair 01/14/2024. Image quality is degraded by body habitus. Lateral view fails to image the knee joint entirely, making assessment for effusion difficult. Slight lateral subluxation of the proximal tibia with respect to the distal femur. IMPRESSION: 1. Suboptimal image quality due to body habitus and incomplete imaging of the knee joint on the lateral view. 2. Recent medial patellofemoral ligament repair. 3. No acute findings. Electronically Signed   By: Newell Eke M.D.   On: 01/29/2024 13:43   DG Chest Port 1 View Result Date: 01/29/2024 CLINICAL DATA:  Shortness of breath. EXAM: PORTABLE CHEST 1 VIEW COMPARISON:  01/17/2024 and CT chest 01/17/2024. FINDINGS: Trachea is midline. Heart size stable. Lungs are low in volume but clear. No pleural fluid. Elevated right hemidiaphragm. IMPRESSION: No acute findings. Electronically Signed   By:  Newell Eke M.D.   On: 01/29/2024 13:32   US  EKG SITE RITE Result Date: 01/29/2024 If Site Rite image not attached, placement could not be confirmed due to current cardiac rhythm.      LOS: 2 days   Trestan Vahle Verdene  [facility] Pager on [email or web address]  01/31/2024, 9:09 AM

## 2024-01-31 NOTE — Progress Notes (Signed)
   01/31/24 0230  Assess: MEWS Score  Temp 98.1 F (36.7 C)  BP 100/68  MAP (mmHg) 75  Pulse Rate (!) 114  Resp 16  SpO2 93 %  O2 Device Room Air  Assess: MEWS Score  MEWS Temp 0  MEWS Systolic 1  MEWS Pulse 2  MEWS RR 0  MEWS LOC 0  MEWS Score 3  MEWS Score Color Yellow  Assess: if the MEWS score is Yellow or Red  Were vital signs accurate and taken at a resting state? Yes  Does the patient meet 2 or more of the SIRS criteria? No  MEWS guidelines implemented  Yes, yellow  Treat  MEWS Interventions Considered administering scheduled or prn medications/treatments as ordered  Take Vital Signs  Increase Vital Sign Frequency  Yellow: Q2hr x1, continue Q4hrs until patient remains green for 12hrs  Escalate  MEWS: Escalate Yellow: Discuss with charge nurse and consider notifying provider and/or RRT  Notify: Charge Nurse/RN  Name of Charge Nurse/RN Notified Gailen Rubin, RN  Provider Notification  Provider Name/Title B. Jesus.  Date Provider Notified 01/31/24  Time Provider Notified 0254  Method of Notification Page (secure chat)  Notification Reason Change in status (Yellow [place])  Assess: SIRS CRITERIA  SIRS Temperature  0  SIRS Respirations  0  SIRS Pulse 1  SIRS WBC 0  SIRS Score Sum  1

## 2024-01-31 NOTE — Evaluation (Signed)
 Occupational Therapy Evaluation Patient Details Name: Carla Little MRN: [id number] DOB: Oct 22, 1990 Today's Date: 01/31/2024   History of Present Illness   Pt is a 32 y.o. female admitted 01/29/24 for LLE cellulitis. 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 Of note, recent hospitalization 10/24-27 for PNA.     Clinical Impressions Prior to this admission, patient at LTC facility receiving rehab. Patient was able to stand with a RW, and required assist for lower body ADLs. Currently, patient with need for max A of 2 to attempt standing, however unable to clear hips from EOB, and max A of 2 to return to supine to assist with BLEs. Patient with excellent rolling and bed mobility. OT recommending return to facility when medically stable. OT will continue to follow.   If plan is discharge home, recommend the following:   Two people to help with walking and/or transfers;A lot of help with bathing/dressing/bathroom;Assistance with cooking/housework;Assist for transportation;Help with stairs or ramp for entrance     Functional Status Assessment   Patient has had a recent decline in their functional status and demonstrates the ability to make significant improvements in function in a reasonable and predictable amount of time.     Equipment Recommendations   Other (comment) (defer to next venue)     Recommendations for Other Services         Precautions/Restrictions   Precautions Precautions: Fall Recall of Precautions/Restrictions: Impaired Restrictions Weight Bearing Restrictions Per Provider Order: Yes LLE Weight Bearing Per Provider Order: Non weight bearing      Mobility Bed Mobility Overal bed mobility: Needs Assistance Bed Mobility: Rolling, Supine to Sit, Sit to Supine Rolling: Used rails, Supervision   Supine to sit: HOB elevated, Used rails, Supervision Sit to supine: Max assist, +2 for physical assistance   General bed mobility comments: Pt rolled R/L with use of bed rail for bed pads to be repositioned. She sat up on L side of bed with the use of momentum bringing BLE off EOB and pushing up trunk by pulling on rails. Returning to bed PT/OT managed BLE. Repositioned using bed features and pt pushing with RLE and pulling with BUE.    Transfers Overall transfer level: Needs assistance Equipment used: Rolling walker (2 wheels) Transfers: Sit to/from Stand Sit to Stand: From elevated surface, Max assist, +2 physical assistance           General transfer comment: Pt attempted to stand from a raised bed height using bari RW. She verablized needing to maintain LLE NWB. PT/OT provided maxA and utilized bed pad to help pt power up. She was unable to clear hips and c/o feeling like her R foot was slipping depsite the grippy socks and anterior foot block.      Balance Overall balance assessment: Needs assistance Sitting-balance support: Feet supported, Bilateral upper extremity supported Sitting balance-Leahy Scale: Fair Sitting balance - Comments: Pt sat EOB with close supervision.                                   ADL either performed or assessed with clinical judgement   ADL Overall ADL's : Needs assistance/impaired Eating/Feeding: Set up;Sitting   Grooming: Set up;Sitting   Upper Body Bathing: Contact guard  assist;Sitting   Lower Body Bathing: Total assistance;Bed level   Upper Body Dressing : Contact guard assist;Sitting   Lower Body Dressing: Total assistance;Bed level   Toilet Transfer: Maximal assistance;+2 for physical assistance;+2 for safety/equipment;BSC/3in1 Toilet Transfer Details (indicate cue type  and reason): unable to clear hips in session Toileting- Clothing Manipulation and Hygiene: Bed level;Total assistance;+2 for safety/equipment;+2 for physical assistance       Functional mobility during ADLs: Maximal assistance;+2 for physical assistance;+2 for safety/equipment;Cueing for sequencing;Cueing for safety;Rolling walker (2 wheels) General ADL Comments: Prior to this admission, patient at LTC facility receiving rehab. Patient was able to stand with a RW, and required assist for lower body ADLs. Currently, patient with need for max A of 2 to attempt standing, however unable to clear hips from EOB, and max A of 2 to return to supine to assist with BLEs. Patient with excellent rolling and bed mobility. OT recommending return to facility when medically stable. OT will continue to follow.     Vision Baseline Vision/History: 1 Wears glasses Ability to See in Adequate Light: 1 Impaired Patient Visual Report: No change from baseline Vision Assessment?: No apparent visual deficits     Perception Perception: Not tested       Praxis Praxis: Not tested       Pertinent Vitals/Pain Pain Assessment Pain Assessment: 0-10 Pain Score: 6  Pain Location: LLE Pain Descriptors / Indicators: Grimacing, Discomfort, Aching, Sore Pain Intervention(s): Monitored during session, Limited activity within patient's tolerance, Repositioned     Extremity/Trunk Assessment Upper Extremity Assessment Upper Extremity Assessment: Right hand dominant;Overall [facility] for tasks assessed   Lower Extremity Assessment Lower Extremity Assessment: Defer to PT evaluation LLE Deficits / Details: Decreased hip, knee, and ankle AROM. Some limitations secondary to body habitus, but most attributed to multiligamentous injury repair. Grossly 2/5 at hip/knee and 3/5 at ankle for strength. LLE: Unable to fully assess due to pain LLE Sensation: WNL LLE Coordination: decreased gross motor   Cervical / Trunk  Assessment Cervical / Trunk Assessment: Other exceptions Cervical / Trunk Exceptions: Increased Body Habitus   Communication Communication Communication: No apparent difficulties   Cognition Arousal: Alert Behavior During Therapy: WFL for tasks assessed/performed Cognition: No apparent impairments                               Following commands: Intact       Cueing  General Comments   Cueing Techniques: Verbal cues;Gestural cues  VSS on 2L O2   Exercises     Shoulder Instructions      Home Living Family/patient expects to be discharged to:: Skilled nursing facility                                 Additional Comments: LTC resident at [facility]      Prior Functioning/Environment Prior Level of Function : Needs assist;History of Falls (last six months)       Physical Assist : Mobility (physical);ADLs (physical) Mobility (physical): Transfers;Gait ADLs (physical): Toileting;Dressing;IADLs Mobility Comments: Prior to surgery in October, pt was indep with bed mobility and would stand pivot turn to transfer without AD into w/c which should could self-propel. Pt reports working with PT following surgery on static stance using RW. She states it varies between 1-2+ assist. ADLs Comments: Prior to surgery in October, pt was bathing in shower with supervision to  transfer into/out of the bathroom. Pt wears breifs and requires assist for pericare. Staff maange all meals and medications.    OT Problem List: Decreased strength;Decreased activity tolerance;Impaired balance (sitting and/or standing);Obesity;Pain   OT Treatment/Interventions: Self-care/ADL training;Therapeutic exercise;Therapeutic activities;Patient/family education;Balance training;DME and/or AE instruction;Manual therapy      OT Goals(Current goals can be found in the care plan section)   Acute Rehab OT Goals Patient Stated Goal: to get back to rehab OT Goal Formulation:  With patient Time For Goal Achievement: 02/14/24 Potential to Achieve Goals: Good ADL Goals Pt Will Perform Lower Body Bathing: with supervision;sitting/lateral leans;sit to/from stand;with adaptive equipment Pt Will Perform Lower Body Dressing: with supervision;sit to/from stand;sitting/lateral leans;with adaptive equipment Pt Will Transfer to Toilet: with min assist;stand pivot transfer;bedside commode Pt Will Perform Toileting - Clothing Manipulation and hygiene: sitting/lateral leans;sit to/from stand;with adaptive equipment;with min assist Additional ADL Goal #1: Patient will be able to complete bed mobility at min A level as a precursor to OOB movement.   OT Frequency:  Min 2X/week    Co-evaluation   Reason for Co-Treatment: For patient/therapist safety;To address functional/ADL transfers PT goals addressed during session: Mobility/safety with mobility;Balance;Proper use of DME OT goals addressed during session: ADL's and self-care      AM-PAC OT 6 Clicks Daily Activity     Outcome Measure Help from another person eating meals?: A Little Help from another person taking care of personal grooming?: A Little Help from another person toileting, which includes using toliet, bedpan, or urinal?: Total Help from another person bathing (including washing, rinsing, drying)?: A Lot Help from another person to put on and taking off regular upper body clothing?: A Little Help from another person to put on and taking off regular lower body clothing?: Total 6 Click Score: 13   End of Session Equipment Utilized During Treatment: Rolling walker (2 wheels) Nurse Communication: Mobility status  Activity Tolerance: Patient tolerated treatment well Patient left: in bed;with call bell/phone within reach  OT Visit Diagnosis: Unsteadiness on feet (R26.81);Other abnormalities of gait and mobility (R26.89);Muscle weakness (generalized) (M62.81);Pain Pain - Right/Left: Left Pain - part of body:  Leg                Time: 9154-9087 OT Time Calculation (min): 27 min Charges:  OT General Charges $OT Visit: 1 Visit OT Evaluation $OT Eval Moderate Complexity: 1 Mod  Ronal Gift E. Pearlie Nies, OTR/L Acute Rehabilitation Services [phone]   Ronal Gift Salt 01/31/2024, 11:17 AM

## 2024-02-01 DIAGNOSIS — L03116 Cellulitis of left lower limb: Secondary | ICD-10-CM | POA: Diagnosis not present

## 2024-02-01 DIAGNOSIS — I471 Supraventricular tachycardia, unspecified: Secondary | ICD-10-CM

## 2024-02-01 LAB — MAGNESIUM: Magnesium: 1.9 mg/dL (ref 1.7–2.4)

## 2024-02-01 LAB — T4, FREE: Free T4: 0.76 ng/dL (ref 0.61–1.12)

## 2024-02-01 MED ORDER — METOPROLOL TARTRATE 12.5 MG HALF TABLET
12.5000 mg | ORAL_TABLET | Freq: Two times a day (BID) | ORAL | Status: DC
  Administered 2024-02-01 – 2024-02-02 (×3): 12.5 mg via ORAL
  Filled 2024-02-01 (×5): qty 1

## 2024-02-01 NOTE — Plan of Care (Signed)

## 2024-02-01 NOTE — Progress Notes (Signed)
 [facility] PROGRESS NOTE   LATEESHA BEZOLD FMW:[id number] DOB: February 04, 1991 DOA: 01/29/2024  PCP: Patient, No Pcp Per  Brief History: 33 y.o. female, with medical history significant of GERD, NASH, CIDP, depression, morbid obesity, recent right knee arthroscopy with anterior and posterior cruciate ligament reconstruction with allograft 01/14/2024 by Dr. Gonzella, and another hospital stay from 10/24 till 10/27 for pneumonia. Patient sent from facility due to complaints of shortness of breath and knee pain, patient finished her antibiotic course few days ago, but she was noted to have some erythema to her left knee and her p.o. antibiotics were resumed, as well she does report dyspnea, but she denies any chest pain, unsure of any fevers, during recent hospitalization she was discharged on 3 L nasal cannula which seems to be stable, so she was sent to ED for further evaluation.  She was hospitalized for further management of cellulitis.  Consultants: Orthopedics  Procedures: None    Subjective/Interval History: Patient feels about the same as yesterday.  Occasional discomfort in the left leg and the left knee from recent surgery.  No new complaints offered.  No dizziness or lightheadedness.  She mentions that she was taken off of metoprolol  due to low blood pressures.  Willing to try a lower dose.    Assessment/Plan:  Left lower extremity cellulitis This is in the setting of recent left knee surgery for ACL reconstruction.  Seen by orthopedics with no concern for septic arthritis.  No concern for incision site infection. Ultrasound did not show any DVT. Patient was started on IV antibiotics.  She is noted to be on cefazolin .  WBC has been normal.  She is afebrile.  No significant change in erythema noted this morning.  Continue cefazolin  for now.  If there is no improvement by tomorrow we may have to consider changing her antibiotics.    Recent left knee surgery Stable per  orthopedics.  Continue Xarelto for DVT prophylaxis.  Elevated D-dimer Nonspecific.  No DVT or PE identified.  Could be elevated from acute infection.  Recent HCAP/obesity hypoventilation syndrome/obstructive sleep apnea/hypoxia Recent hospitalization for pneumonia noted.  Was on oxygen at her facility.  Currently on room air.  Monitor saturations closely. CT angiogram did not show any PE. Respiratory status noted to be stable.  Hypotension Blood pressure has improved with IV fluid boluses. Heart rate noted to be elevated.  See below regarding SVT.    Elevated TSH TSH was 7.3.  Free T4 is normal at 0.76.  History of SVT Mild sinus tachycardia noted.  Used to be on beta-blocker previously for SVT but was held due to low blood pressures.  Will reattempt at a lower dose since blood pressures have proved.  Morbid obesity Estimated body mass index is 60.08 kg/m as calculated from the following:   Height as of this encounter: 5' 4 (1.626 m).   Weight as of this encounter: 158.8 kg.  DVT Prophylaxis: On Xarelto Code Status: Full code Family Communication: Discussed with patient Disposition Plan: Hopefully return back to facility when improved     Medications: Scheduled:  buPROPion   300 mg Oral Daily   busPIRone   20 mg Oral TID   Chlorhexidine  Gluconate Cloth  6 each Topical Daily   [START ON 02/20/2024] cyanocobalamin   1,000 mcg Intramuscular Q30 days   doxepin  25 mg Oral QHS   folic acid   1 mg Oral Daily   hydrOXYzine   25 mg Oral QHS   linaclotide   290 mcg Oral QAC breakfast  magnesium  oxide  400 mg Oral BID   metoprolol  tartrate  12.5 mg Oral BID   pantoprazole   40 mg Oral Daily   pregabalin   100 mg Oral TID   rivaroxaban  10 mg Oral Daily   sodium chloride  flush  10-40 mL Intracatheter Q12H   venlafaxine XR  225 mg Oral Daily   Continuous:   ceFAZolin  (ANCEF ) IV 2 g (02/01/24 0635)   PRN:acetaminophen , loperamide, melatonin, methocarbamol , ondansetron  (ZOFRAN ) IV,  oxyCODONE , polyethylene glycol, sodium chloride  flush  Antibiotics: Anti-infectives (From admission, onward)    Start     Dose/Rate Route Frequency Ordered Stop   01/30/24 0000  ceFAZolin  (ANCEF ) IVPB 2g/100 mL premix        2 g 200 mL/hr over 30 Minutes Intravenous Every 8 hours 01/29/24 2138     01/29/24 1415  ceFAZolin  (ANCEF ) IVPB 2g/100 mL premix        2 g 200 mL/hr over 30 Minutes Intravenous  Once 01/29/24 1412 01/29/24 1746       Objective:  Vital Signs  Vitals:   01/31/24 1659 01/31/24 2119 02/01/24 0145 02/01/24 0427  BP: 105/70 128/77 122/75 129/70  Pulse: (!) 104 (!) 112 (!) 111 (!) 112  Resp: 17 19 17 18   Temp: 98 F (36.7 C) 97.8 F (36.6 C) 98 F (36.7 C) 98.2 F (36.8 C)  TempSrc: Oral Oral Oral Oral  SpO2: 98% 97% 96% 96%  Weight:      Height:        Intake/Output Summary (Last 24 hours) at 02/01/2024 0920 Last data filed at 01/31/2024 2000 Gross per 24 hour  Intake 600 ml  Output 600 ml  Net 0 ml   Filed Weights   01/29/24 1136  Weight: (!) 158.8 kg    General appearance: Awake alert.  In no distress Resp: Clear to auscultation bilaterally.  Normal effort Cardio: S1-S2 is normal regular.  No S3-S4.  No rubs murmurs or bruit GI: Abdomen is soft.  Nontender nondistended.  Bowel sounds are present normal.  No masses organomegaly Extremities: Erythema left lower extremity.  Unchanged from yesterday.  Incision sites over the left knee are clean.  No active drainage noted. No focal neurological deficits  Lab Results:  Data Reviewed: I have personally reviewed following labs and reports of the imaging studies  CBC: Recent Labs  Lab 01/29/24 1236 01/30/24 0449  WBC 6.5 5.9  HGB 10.6* 10.6*  HCT 36.4 35.6*  MCV 92.9 91.0  PLT 396 459*    Basic Metabolic Panel: Recent Labs  Lab 01/29/24 1236 01/30/24 0449 01/31/24 0205 02/01/24 0230  NA 141 141 140  --   K 4.0 3.7 3.5  --   CL 102 101 100  --   CO2 31 34* 31  --   GLUCOSE 112*  79 107*  --   BUN 5* <5* <5*  --   CREATININE 0.58 0.56 0.59  --   CALCIUM  8.2* 8.4* 7.8*  --   MG  --   --  1.8 1.9    GFR: Estimated Creatinine Clearance: 149.2 mL/min (by C-G formula based on SCr of 0.59 mg/dL).  Thyroid  Function Tests: Recent Labs    01/31/24 0205 02/01/24 0230  TSH 7.394*  --   FREET4  --  0.76     Radiology Studies: No results found.      LOS: 3 days   Dontaye Hur [employer] on [email or web address]  02/01/2024, 9:20 AM

## 2024-02-02 DIAGNOSIS — I471 Supraventricular tachycardia, unspecified: Secondary | ICD-10-CM | POA: Diagnosis not present

## 2024-02-02 DIAGNOSIS — L03116 Cellulitis of left lower limb: Secondary | ICD-10-CM | POA: Diagnosis not present

## 2024-02-02 LAB — BASIC METABOLIC PANEL WITH GFR
Anion gap: 9 (ref 5–15)
BUN: <5 mg/dL — ABNORMAL LOW (ref 6–20)
CO2: 32 mmol/L (ref 22–32)
Calcium: 8 mg/dL — ABNORMAL LOW (ref 8.9–10.3)
Chloride: 98 mmol/L (ref 98–111)
Creatinine, Ser: 0.66 mg/dL (ref 0.44–1.00)
GFR, Estimated: >60 mL/min (ref >60)
Glucose, Bld: 100 mg/dL — ABNORMAL HIGH (ref 70–99)
Potassium: 3.7 mmol/L (ref 3.5–5.1)
Sodium: 139 mmol/L (ref 135–145)

## 2024-02-02 LAB — CBC
HCT: 34.7 % — ABNORMAL LOW (ref 36.0–46.0)
Hemoglobin: 10.1 g/dL — ABNORMAL LOW (ref 12.0–15.0)
MCH: 26.4 pg (ref 26.0–34.0)
MCHC: 29.1 g/dL — ABNORMAL LOW (ref 30.0–36.0)
MCV: 90.8 fL (ref 80.0–100.0)
Platelets: 392 K/uL (ref 150–400)
RBC: 3.82 MIL/uL — ABNORMAL LOW (ref 3.87–5.11)
RDW: 17.4 % — ABNORMAL HIGH (ref 11.5–15.5)
WBC: 6.1 K/uL (ref 4.0–10.5)
nRBC: 0 % (ref 0.0–0.2)

## 2024-02-02 LAB — MAGNESIUM: Magnesium: 1.9 mg/dL (ref 1.7–2.4)

## 2024-02-02 MED ORDER — DIPHENHYDRAMINE HCL 25 MG PO CAPS
25.0000 mg | ORAL_CAPSULE | Freq: Three times a day (TID) | ORAL | Status: DC | PRN
Start: 2024-02-02 — End: 2024-02-06
  Administered 2024-02-02 – 2024-02-03 (×2): 25 mg via ORAL
  Filled 2024-02-02 (×2): qty 1

## 2024-02-02 MED ORDER — POTASSIUM CHLORIDE CRYS ER 20 MEQ PO TBCR
40.0000 meq | EXTENDED_RELEASE_TABLET | Freq: Once | ORAL | Status: AC
  Administered 2024-02-02: 40 meq via ORAL
  Filled 2024-02-02: qty 2

## 2024-02-02 MED ORDER — HYDROXYZINE HCL 25 MG PO TABS
25.0000 mg | ORAL_TABLET | Freq: Three times a day (TID) | ORAL | Status: DC | PRN
  Administered 2024-02-02: 25 mg via ORAL
  Filled 2024-02-02: qty 1

## 2024-02-02 MED ORDER — FUROSEMIDE 20 MG PO TABS
20.0000 mg | ORAL_TABLET | Freq: Every day | ORAL | Status: AC
  Administered 2024-02-02 – 2024-02-04 (×3): 20 mg via ORAL
  Filled 2024-02-02 (×3): qty 1

## 2024-02-02 NOTE — Progress Notes (Signed)
 [facility] PROGRESS NOTE   Carla Little FMW:[id number] DOB: [birth year] DOA: 01/29/2024  PCP: Patient, No Pcp Per  Brief History: 32 y.o. female, with medical history significant of GERD, NASH, CIDP, depression, morbid obesity, recent right knee arthroscopy with anterior and posterior cruciate ligament reconstruction with allograft 01/14/2024 by Dr. Gonzella, and another hospital stay from 10/24 till 10/27 for pneumonia. Patient sent from facility due to complaints of shortness of breath and knee pain, patient finished her antibiotic course few days ago, but she was noted to have some erythema to her left knee and her p.o. antibiotics were resumed, as well she does report dyspnea, but she denies any chest pain, unsure of any fevers, during recent hospitalization she was discharged on 3 L nasal cannula which seems to be stable, so she was sent to ED for further evaluation.  She was hospitalized for further management of cellulitis.  Consultants: Orthopedics  Procedures: None    Subjective/Interval History: Patient mentions that the redness in her left leg seems to be slightly better this morning.  She states that usually her legs do not been swollen.  No other complaints offered.  No shortness of breath or chest pain.     Assessment/Plan:  Left lower extremity cellulitis This is in the setting of recent left knee surgery for ACL reconstruction.  Seen by orthopedics with no concern for septic arthritis.  No concern for incision site infection. Ultrasound did not show any DVT. Patient was started on IV antibiotics.  She is noted to be on cefazolin .  WBC has been normal.  She is afebrile.   There has been some improvement in the erythema this morning.  Leave her on cefazolin  for now.  Lasix  can be given to try and reduce the edema.     Recent left knee surgery Stable per orthopedics.  Continue Xarelto for DVT prophylaxis.  Elevated D-dimer Nonspecific.  No DVT or PE  identified.  Could be elevated from acute infection.  Recent HCAP/obesity hypoventilation syndrome/obstructive sleep apnea/hypoxia Recent hospitalization for pneumonia noted.  Was on oxygen at her facility.  Currently on room air.  Monitor saturations closely. CT angiogram did not show any PE. Respiratory status noted to be stable.  Hypotension Blood pressure has improved with IV fluid boluses. Heart rate noted to be elevated.  See below regarding SVT.    Normocytic anemia Stable hemoglobin noted.  No evidence of overt bleeding.  Elevated TSH TSH was 7.3.  Free T4 is normal at 0.76.  History of SVT Mild sinus tachycardia noted.  Used to be on beta-blocker previously for SVT but was held due to low blood pressures.  Metoprolol  reinitiated at low-dose.  Seems to be tolerating it well.Heart rate is better.  Morbid obesity Estimated body mass index is 60.08 kg/m as calculated from the following:   Height as of this encounter: 5' 4 (1.626 m).   Weight as of this encounter: 158.8 kg.  DVT Prophylaxis: On Xarelto Code Status: Full code Family Communication: Discussed with patient Disposition Plan: Hopefully return back to facility when improved     Medications: Scheduled:  buPROPion   300 mg Oral Daily   busPIRone   20 mg Oral TID   Chlorhexidine  Gluconate Cloth  6 each Topical Daily   [START ON 02/20/2024] cyanocobalamin   1,000 mcg Intramuscular Q30 days   doxepin  25 mg Oral QHS   folic acid   1 mg Oral Daily   furosemide   20 mg Oral Daily   hydrOXYzine   25  mg Oral QHS   linaclotide   290 mcg Oral QAC breakfast   metoprolol  tartrate  12.5 mg Oral BID   pantoprazole   40 mg Oral Daily   potassium chloride   40 mEq Oral Once   pregabalin   100 mg Oral TID   rivaroxaban  10 mg Oral Daily   sodium chloride  flush  10-40 mL Intracatheter Q12H   venlafaxine XR  225 mg Oral Daily   Continuous:   ceFAZolin  (ANCEF ) IV 2 g (02/02/24 0529)   PRN:acetaminophen , loperamide, melatonin,  methocarbamol , ondansetron  (ZOFRAN ) IV, oxyCODONE , polyethylene glycol, sodium chloride  flush  Antibiotics: Anti-infectives (From admission, onward)    Start     Dose/Rate Route Frequency Ordered Stop   01/30/24 0000  ceFAZolin  (ANCEF ) IVPB 2g/100 mL premix        2 g 200 mL/hr over 30 Minutes Intravenous Every 8 hours 01/29/24 2138     01/29/24 1415  ceFAZolin  (ANCEF ) IVPB 2g/100 mL premix        2 g 200 mL/hr over 30 Minutes Intravenous  Once 01/29/24 1412 01/29/24 1746       Objective:  Vital Signs  Vitals:   02/01/24 1005 02/01/24 1429 02/01/24 2055 02/02/24 0530  BP:  115/75 119/64 106/65  Pulse:  91 92 92  Resp:  16 18 17   Temp:  98.5 F (36.9 C) 98.2 F (36.8 C) (!) 97.4 F (36.3 C)  TempSrc:  Oral Oral Oral  SpO2: 97% 92% 97% 93%  Weight:      Height:        Intake/Output Summary (Last 24 hours) at 02/02/2024 0856 Last data filed at 02/02/2024 9367 Gross per 24 hour  Intake 1140 ml  Output 400 ml  Net 740 ml   Filed Weights   01/29/24 1136  Weight: (!) 158.8 kg    General appearance: Awake alert.  In no distress Resp: Clear to auscultation bilaterally.  Normal effort Cardio: S1-S2 is normal regular.  No S3-S4.  No rubs murmurs or bruit GI: Abdomen is soft.  Nontender nondistended.  Bowel sounds are present normal.  No masses organomegaly Extremities: Erythema left lower extremity with some edema.  Left knee looks stable.  No drainage from incision sites.  Lab Results:  Data Reviewed: I have personally reviewed following labs and reports of the imaging studies  CBC: Recent Labs  Lab 01/29/24 1236 01/30/24 0449 02/02/24 0251  WBC 6.5 5.9 6.1  HGB 10.6* 10.6* 10.1*  HCT 36.4 35.6* 34.7*  MCV 92.9 91.0 90.8  PLT 396 459* 392    Basic Metabolic Panel: Recent Labs  Lab 01/29/24 1236 01/30/24 0449 01/31/24 0205 02/01/24 0230 02/02/24 0251  NA 141 141 140  --  139  K 4.0 3.7 3.5  --  3.7  CL 102 101 100  --  98  CO2 31 34* 31  --  32   GLUCOSE 112* 79 107*  --  100*  BUN 5* <5* <5*  --  <5*  CREATININE 0.58 0.56 0.59  --  0.66  CALCIUM  8.2* 8.4* 7.8*  --  8.0*  MG  --   --  1.8 1.9 1.9    GFR: Estimated Creatinine Clearance: 149.2 mL/min (by C-G formula based on SCr of 0.66 mg/dL).  Thyroid  Function Tests: Recent Labs    01/31/24 0205 02/01/24 0230  TSH 7.394*  --   FREET4  --  0.76     Radiology Studies: No results found.      LOS: 4 days  Tami Barren  [facility] Pager on [email or web address]  02/02/2024, 8:56 AM

## 2024-02-02 NOTE — Plan of Care (Signed)
  Problem: Clinical Measurements: Goal: Ability to maintain clinical measurements within normal limits will improve Outcome: Progressing   Problem: Nutrition: Goal: Adequate nutrition will be maintained Outcome: Progressing   Problem: Pain Managment: Goal: General experience of comfort will improve and/or be controlled Outcome: Progressing

## 2024-02-02 NOTE — Plan of Care (Signed)

## 2024-02-03 ENCOUNTER — Encounter (HOSPITAL_BASED_OUTPATIENT_CLINIC_OR_DEPARTMENT_OTHER): Payer: Self-pay | Admitting: Physical Therapy

## 2024-02-03 DIAGNOSIS — I471 Supraventricular tachycardia, unspecified: Secondary | ICD-10-CM | POA: Diagnosis not present

## 2024-02-03 DIAGNOSIS — L03116 Cellulitis of left lower limb: Secondary | ICD-10-CM | POA: Diagnosis not present

## 2024-02-03 LAB — T3, FREE: T3, Free: 2.8 pg/mL (ref 2.0–4.4)

## 2024-02-03 LAB — BASIC METABOLIC PANEL WITH GFR
Anion gap: 8 (ref 5–15)
BUN: <5 mg/dL — ABNORMAL LOW (ref 6–20)
CO2: 31 mmol/L (ref 22–32)
Calcium: 8.4 mg/dL — ABNORMAL LOW (ref 8.9–10.3)
Chloride: 100 mmol/L (ref 98–111)
Creatinine, Ser: 0.66 mg/dL (ref 0.44–1.00)
GFR, Estimated: >60 mL/min (ref >60)
Glucose, Bld: 85 mg/dL (ref 70–99)
Potassium: 3.8 mmol/L (ref 3.5–5.1)
Sodium: 139 mmol/L (ref 135–145)

## 2024-02-03 MED ORDER — SACCHAROMYCES BOULARDII 250 MG PO CAPS
250.0000 mg | ORAL_CAPSULE | Freq: Two times a day (BID) | ORAL | Status: DC
  Administered 2024-02-03 – 2024-02-06 (×7): 250 mg via ORAL
  Filled 2024-02-03 (×7): qty 1

## 2024-02-03 MED ORDER — METOPROLOL TARTRATE 12.5 MG HALF TABLET
12.5000 mg | ORAL_TABLET | Freq: Two times a day (BID) | ORAL | Status: DC
  Administered 2024-02-04 (×2): 12.5 mg via ORAL
  Filled 2024-02-03 (×2): qty 1

## 2024-02-03 MED ORDER — VANCOMYCIN HCL 1750 MG/350ML IV SOLN
1750.0000 mg | Freq: Two times a day (BID) | INTRAVENOUS | Status: DC
  Administered 2024-02-04 – 2024-02-06 (×6): 1750 mg via INTRAVENOUS
  Filled 2024-02-03 (×7): qty 350

## 2024-02-03 MED ORDER — VANCOMYCIN HCL 2000 MG/400ML IV SOLN
2000.0000 mg | Freq: Once | INTRAVENOUS | Status: AC
  Administered 2024-02-03: 2000 mg via INTRAVENOUS
  Filled 2024-02-03: qty 400

## 2024-02-03 MED ORDER — POTASSIUM CHLORIDE CRYS ER 20 MEQ PO TBCR
40.0000 meq | EXTENDED_RELEASE_TABLET | Freq: Every day | ORAL | Status: AC
  Administered 2024-02-03 – 2024-02-05 (×3): 40 meq via ORAL
  Filled 2024-02-03 (×3): qty 2

## 2024-02-03 NOTE — Plan of Care (Signed)

## 2024-02-03 NOTE — TOC Progression Note (Signed)
 Transition of Care [facility]) - Progression Note    Patient Details  Name: Carla Little MRN: [id number] Date of Birth: [birth year]  [facility] CM/SW Contact  Jovian Lembcke [name], [place] Phone Number: 02/03/2024, 9:25 AM  Clinical Narrative:    Pt is from [facility]. Plan is to return when medically ready for DC. CSW will continue to follow.   Expected Discharge Plan: Long Term Nursing Home Barriers to Discharge: Barriers Resolved               Expected Discharge Plan and Services In-house Referral: Clinical Social Work Discharge Planning Services: CM Consult Post Acute Care Choice: Skilled Nursing Facility, Nursing Home Living arrangements for the past 2 months: Skilled Nursing Facility                                       Social Drivers of Health (SDOH) Interventions SDOH Screenings   Food Insecurity: No Food Insecurity (01/30/2024)  Housing: Low Risk  (01/30/2024)  Transportation Needs: No Transportation Needs (01/31/2024)  Utilities: Not At Risk (01/30/2024)  Tobacco Use: Low Risk  (01/29/2024)    Readmission Risk Interventions    01/30/2024    8:54 AM 01/20/2024   12:00 PM 01/19/2024    8:45 AM  Readmission Risk Prevention Plan  Transportation Screening Complete Complete Complete  PCP or Specialist Appt within 3-5 Days   Complete  HRI or Home Care Consult Complete Complete Complete  Social Work Consult for Recovery Care Planning/Counseling Complete Complete Complete  Palliative Care Screening Not Applicable Not Applicable Not Applicable  Medication Review Oceanographer) Complete Complete Complete

## 2024-02-03 NOTE — Progress Notes (Signed)
 Patient complains of nausea, sweats and headache. O2 was in the 80's, placed her on 2L O2 nasal cannula, otherwise vitals signs are stable.

## 2024-02-03 NOTE — Progress Notes (Signed)
 [facility] PROGRESS NOTE   Carla Little FMW:[id number] DOB: 05-13-1990 DOA: 01/29/2024  PCP: Patient, No Pcp Per  Brief History: 33 y.o. female, with medical history significant of GERD, NASH, CIDP, depression, morbid obesity, recent right knee arthroscopy with anterior and posterior cruciate ligament reconstruction with allograft 01/14/2024 by Dr. Gonzella, and another hospital stay from 10/24 till 10/27 for pneumonia. Patient sent from facility due to complaints of shortness of breath and knee pain, patient finished her antibiotic course few days ago, but she was noted to have some erythema to her left knee and her p.o. antibiotics were resumed, as well she does report dyspnea, but she denies any chest pain, unsure of any fevers, during recent hospitalization she was discharged on 3 L nasal cannula which seems to be stable, so she was sent to ED for further evaluation.  She was hospitalized for further management of cellulitis.  Consultants: Orthopedics  Procedures: None    Subjective/Interval History: Patient mentioned that she urinated a lot yesterday.  Left leg is still the same.  Denies any pain.  No dizziness or lightheadedness.  No chest pain or shortness of breath.       Assessment/Plan:  Left lower extremity cellulitis This is in the setting of recent left knee surgery for ACL reconstruction.  Seen by orthopedics with no concern for septic arthritis.  No concern for incision site infection. Ultrasound did not show any DVT. Patient was started on cefazolin .  WBC has been normal.  She is afebrile. No significant improvement noted in the left lower extremity which remains quite erythematous.  Will add vancomycin. Continue with low-dose furosemide  to try and reduce the edema.  Low blood pressure limits treatment options.    Recent left knee surgery Stable per orthopedics.  Continue Xarelto for DVT prophylaxis.  Elevated D-dimer Nonspecific.  No DVT or PE  identified.  Could be elevated from acute infection.  Recent HCAP/obesity hypoventilation syndrome/obstructive sleep apnea/hypoxia Recent hospitalization for pneumonia noted.  Was on oxygen at her facility.  Currently on room air.  Monitor saturations closely. CT angiogram did not show any PE. Respiratory status noted to be stable.  Hypotension Blood pressure has improved with IV fluid boluses. Low blood pressures over the last 24 hours most likely due to metoprolol  and furosemide .  Holding metoprolol  today.  She is asymptomatic.    Normocytic anemia Stable hemoglobin noted.  No evidence of overt bleeding.  Elevated TSH TSH was 7.3.  Free T4 is normal at 0.76.  Free T3 is normal at 2.8.  History of SVT Mild sinus tachycardia noted.   Used to be on beta-blocker previously for SVT but was held due to low blood pressures.  Metoprolol  reinitiated at low-dose.  Holding today due to borderline low blood pressures.    Morbid obesity Estimated body mass index is 60.08 kg/m as calculated from the following:   Height as of this encounter: 5' 4 (1.626 m).   Weight as of this encounter: 158.8 kg.  DVT Prophylaxis: On Xarelto Code Status: Full code Family Communication: Discussed with patient Disposition Plan: Hopefully return back to facility when improved     Medications: Scheduled:  buPROPion   300 mg Oral Daily   busPIRone   20 mg Oral TID   Chlorhexidine  Gluconate Cloth  6 each Topical Daily   [START ON 02/20/2024] cyanocobalamin   1,000 mcg Intramuscular Q30 days   doxepin  25 mg Oral QHS   folic acid   1 mg Oral Daily   furosemide   20 mg Oral Daily   linaclotide   290 mcg Oral QAC breakfast   metoprolol  tartrate  12.5 mg Oral BID   pantoprazole   40 mg Oral Daily   potassium chloride   40 mEq Oral Daily   pregabalin   100 mg Oral TID   rivaroxaban  10 mg Oral Daily   sodium chloride  flush  10-40 mL Intracatheter Q12H   venlafaxine XR  225 mg Oral Daily   Continuous:    ceFAZolin  (ANCEF ) IV 2 g (02/03/24 0545)   vancomycin     vancomycin     PRN:acetaminophen , diphenhydrAMINE , loperamide, melatonin, methocarbamol , ondansetron  (ZOFRAN ) IV, oxyCODONE , polyethylene glycol, sodium chloride  flush  Antibiotics: Anti-infectives (From admission, onward)    Start     Dose/Rate Route Frequency Ordered Stop   02/03/24 2200  vancomycin (VANCOREADY) IVPB 1750 mg/350 mL        1,750 mg 175 mL/hr over 120 Minutes Intravenous Every 12 hours 02/03/24 0915     02/03/24 1015  vancomycin (VANCOREADY) IVPB 2000 mg/400 mL        2,000 mg 200 mL/hr over 120 Minutes Intravenous  Once 02/03/24 0915     01/30/24 0000  ceFAZolin  (ANCEF ) IVPB 2g/100 mL premix        2 g 200 mL/hr over 30 Minutes Intravenous Every 8 hours 01/29/24 2138     01/29/24 1415  ceFAZolin  (ANCEF ) IVPB 2g/100 mL premix        2 g 200 mL/hr over 30 Minutes Intravenous  Once 01/29/24 1412 01/29/24 1746       Objective:  Vital Signs  Vitals:   02/02/24 2118 02/02/24 2124 02/03/24 0537 02/03/24 0917  BP: (!) 97/58 99/71 95/62  99/62  Pulse: 81 88 93 86  Resp: 18  16   Temp: 98.3 F (36.8 C)  98.3 F (36.8 C) 98.2 F (36.8 C)  TempSrc: Oral  Oral Oral  SpO2: 90% 91% 92% 92%  Weight:      Height:        Intake/Output Summary (Last 24 hours) at 02/03/2024 0950 Last data filed at 02/02/2024 1700 Gross per 24 hour  Intake 480 ml  Output 3250 ml  Net -2770 ml   Filed Weights   01/29/24 1136  Weight: (!) 158.8 kg    General appearance: Awake alert.  In no distress Resp: Clear to auscultation bilaterally.  Normal effort Cardio: S1-S2 is normal regular.  No S3-S4.  No rubs murmurs or bruit GI: Abdomen is soft.  Nontender nondistended.  Bowel sounds are present normal.  No masses organomegaly Extremities: Persistent erythema involving left lower extremity with no significant improvement in the last 2 to 3 days.  Lab Results:  Data Reviewed: I have personally reviewed following labs and  reports of the imaging studies  CBC: Recent Labs  Lab 01/29/24 1236 01/30/24 0449 02/02/24 0251  WBC 6.5 5.9 6.1  HGB 10.6* 10.6* 10.1*  HCT 36.4 35.6* 34.7*  MCV 92.9 91.0 90.8  PLT 396 459* 392    Basic Metabolic Panel: Recent Labs  Lab 01/29/24 1236 01/30/24 0449 01/31/24 0205 02/01/24 0230 02/02/24 0251 02/03/24 0311  NA 141 141 140  --  139 139  K 4.0 3.7 3.5  --  3.7 3.8  CL 102 101 100  --  98 100  CO2 31 34* 31  --  32 31  GLUCOSE 112* 79 107*  --  100* 85  BUN 5* <5* <5*  --  <5* <5*  CREATININE 0.58 0.56 0.59  --  0.66 0.66  CALCIUM  8.2* 8.4* 7.8*  --  8.0* 8.4*  MG  --   --  1.8 1.9 1.9  --     GFR: Estimated Creatinine Clearance: 149.2 mL/min (by C-G formula based on SCr of 0.66 mg/dL).  Thyroid  Function Tests: Recent Labs    02/01/24 0230  FREET4 0.76  T3FREE 2.8     Radiology Studies: No results found.      LOS: 5 days   Vedansh Kerstetter [employer] on [email or web address]  02/03/2024, 9:50 AM

## 2024-02-03 NOTE — Progress Notes (Signed)
   02/03/24 0917  Vitals  Temp 98.2 F (36.8 C)  Temp Source Oral  BP 99/62  MAP (mmHg) 75  BP Location Left Wrist  BP Method Automatic  Patient Position (if appropriate) Lying  Pulse Rate 86  Pulse Rate Source Monitor  MEWS COLOR  MEWS Score Color Green  Oxygen Therapy  SpO2 92 %  O2 Device Room Air  MEWS Score  MEWS Temp 0  MEWS Systolic 1  MEWS Pulse 0  MEWS RR 0  MEWS LOC 0  MEWS Score 1   Ok to hold Metoprolol   due to BP as per Dr, Krishan

## 2024-02-03 NOTE — Progress Notes (Signed)
 Pharmacy Antibiotic Note  Carla Little is a 33 y.o. female admitted on 01/29/2024 with cellulitis.  Pharmacy has been consulted for Vanc dosing.  Vancomycin 1750 mg IV Q 12 hrs. Goal AUC 400-550. Expected AUC: 449 SCr used: 0.66   Plan: Vanc 2 gm IV x 1, then 1750 mg IV q12hr Monitor clinical status, renal function and vanc levels as indicated.   Height: 5' 4 (162.6 cm) Weight: (!) 158.8 kg (350 lb) IBW/kg (Calculated) : 54.7  Temp (24hrs), Avg:98.5 F (36.9 C), Min:98.3 F (36.8 C), Max:98.7 F (37.1 C)  Recent Labs  Lab 01/29/24 1236 01/29/24 1459 01/30/24 0449 01/31/24 0205 02/02/24 0251 02/03/24 0311  WBC 6.5  --  5.9  --  6.1  --   CREATININE 0.58  --  0.56 0.59 0.66 0.66  LATICACIDVEN 2.1* 2.3*  --   --   --   --     Estimated Creatinine Clearance: 149.2 mL/min (by C-G formula based on SCr of 0.66 mg/dL).    Allergies  Allergen Reactions   Azithromycin  Shortness Of Breath and Nausea And Vomiting    Antimicrobials this admission: 11/6 Cefazolin  >>  11/10 Vanc >>   Thank you for allowing pharmacy to be a part of this patient's care.  Donny Alert, PharmD, [facility] Clinical Pharmacist Please see AMION for all Pharmacists' Contact Phone Numbers 02/03/2024, 9:18 AM

## 2024-02-04 DIAGNOSIS — I471 Supraventricular tachycardia, unspecified: Secondary | ICD-10-CM | POA: Diagnosis not present

## 2024-02-04 DIAGNOSIS — L03116 Cellulitis of left lower limb: Secondary | ICD-10-CM | POA: Diagnosis not present

## 2024-02-04 LAB — CBC
HCT: 36.1 % (ref 36.0–46.0)
Hemoglobin: 10.8 g/dL — ABNORMAL LOW (ref 12.0–15.0)
MCH: 26.4 pg (ref 26.0–34.0)
MCHC: 29.9 g/dL — ABNORMAL LOW (ref 30.0–36.0)
MCV: 88.3 fL (ref 80.0–100.0)
Platelets: 382 K/uL (ref 150–400)
RBC: 4.09 MIL/uL (ref 3.87–5.11)
RDW: 17 % — ABNORMAL HIGH (ref 11.5–15.5)
WBC: 5.5 K/uL (ref 4.0–10.5)
nRBC: 0 % (ref 0.0–0.2)

## 2024-02-04 NOTE — Plan of Care (Signed)

## 2024-02-04 NOTE — Plan of Care (Signed)
   Problem: Education: Goal: Knowledge of General Education information will improve Description Including pain rating scale, medication(s)/side effects and non-pharmacologic comfort measures Outcome: Progressing   Problem: Health Behavior/Discharge Planning: Goal: Ability to manage health-related needs will improve Outcome: Progressing

## 2024-02-04 NOTE — Progress Notes (Signed)
 Physical Therapy Treatment Patient Details Name: Carla Little MRN: [id number] DOB: Jul 28, 1990 Today's Date: 02/04/2024   History of Present Illness Pt is a 33 y.o. female admitted 01/29/24 for LLE cellulitis. 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 Of note, recent hospitalization 10/24-27 for PNA.    PT Comments  Pt resting in bed on arrival, pleasant and agreeable to session with good progress towards acute goals. Pt eager to progress and able to come to standing x2 during session with RW for support and max A +2 to boost to stand. Once standing pt able to maintain ~25 seconds with light min A to maintain balance with good adherence to NWB on L throughout. Pt requiring increased assist, up to mod A +2 to return Les to bed at end of session. Patient will benefit from continued inpatient follow up therapy, <3 hours/day, will continue to follow acutely.     If plan is discharge home, recommend the following: Two people to help with walking and/or transfers;A lot of help with bathing/dressing/bathroom;Assistance with cooking/housework;Assist for transportation;Help with stairs or ramp for entrance   Can travel by private vehicle     No  Equipment Recommendations  None recommended by PT    Recommendations for Other Services       Precautions / Restrictions Precautions Precautions: Fall Recall of Precautions/Restrictions: Impaired Restrictions Weight Bearing Restrictions Per Provider Order: Yes LLE Weight Bearing Per Provider Order: Non weight bearing     Mobility  Bed Mobility Overal bed mobility: Needs Assistance Bed Mobility: Supine to Sit, Sit to Supine     Supine to  sit: HOB elevated, Used rails, Supervision Sit to supine: +2 for physical assistance, Mod assist, Used rails   General bed mobility comments: Pt sat up on L side of bed with the use of momentum bringing BLE off EOB and pushing up trunk by pulling on rails. Returning to bed mod A to manage BLE. Repositioned using bed features and pt pushing with RLE and pulling with BUE.    Transfers Overall transfer level: Needs assistance Equipment used: Rolling walker (2 wheels) Transfers: Sit to/from Stand Sit to Stand: From elevated surface, Max assist, +2 physical assistance           General transfer comment: pt standing x2 with max A +2 to boost with use of bed pad, able to maintain ~25 seconds each stand. Good adherence to NWB in standing    Ambulation/Gait               General Gait Details: Unable   Stairs             Wheelchair Mobility     Tilt Bed    Modified Rankin (Stroke Patients Only)       Balance Overall balance assessment: Needs assistance Sitting-balance support: Feet supported, Bilateral upper extremity supported Sitting balance-Leahy Scale: Fair Sitting balance - Comments: Pt sat EOB with close supervision.   Standing balance support: Bilateral upper extremity supported, During functional activity, Reliant on assistive device for balance Standing balance-Leahy Scale: Poor Standing balance comment: Using RW with cuing for weight bearing status.                            Communication Communication Communication: No  apparent difficulties  Cognition Arousal: Alert Behavior During Therapy: WFL for tasks assessed/performed   PT - Cognitive impairments: No apparent impairments                         Following commands: Intact      Cueing Cueing Techniques: Verbal cues, Gestural cues  Exercises      General Comments General comments (skin integrity, edema, etc.): VSS on supplemental O2      Pertinent Vitals/Pain  Pain Assessment Pain Assessment: Faces Faces Pain Scale: Hurts little more Pain Location: LLE Pain Descriptors / Indicators: Grimacing, Discomfort, Aching, Sore Pain Intervention(s): Monitored during session, Limited activity within patient's tolerance    Home Living                          Prior Function            PT Goals (current goals can now be found in the care plan section) Acute Rehab PT Goals Patient Stated Goal: Get back to standing and advance to pivoting in order to transfer PT Goal Formulation: With patient Time For Goal Achievement: 02/14/24 Progress towards PT goals: Progressing toward goals    Frequency    Min 2X/week      PT Plan      Co-evaluation              AM-PAC PT 6 Clicks Mobility   Outcome Measure  Help needed turning from your back to your side while in a flat bed without using bedrails?: A Little Help needed moving from lying on your back to sitting on the side of a flat bed without using bedrails?: Total Help needed moving to and from a bed to a chair (including a wheelchair)?: Total Help needed standing up from a chair using your arms (e.g., wheelchair or bedside chair)?: Total Help needed to walk in hospital room?: Total Help needed climbing 3-5 steps with a railing? : Total 6 Click Score: 8    End of Session Equipment Utilized During Treatment: Oxygen Activity Tolerance: Patient tolerated treatment well Patient left: in bed;with call bell/phone within reach Nurse Communication: Mobility status PT Visit Diagnosis: Unsteadiness on feet (R26.81);Other abnormalities of gait and mobility (R26.89);Muscle weakness (generalized) (M62.81);Pain Pain - Right/Left: Left Pain - part of body: Knee;Leg;Ankle and joints of foot     Time: 1345-1416 PT Time Calculation (min) (ACUTE ONLY): 31 min  Charges:    $Therapeutic Activity: 23-37 mins PT General Charges $$ ACUTE PT VISIT: 1 Visit                     Christoph Copelan R.  PTA Acute Rehabilitation Services Office: [phone]   Therisa CHRISTELLA Boor 02/04/2024, 2:27 PM

## 2024-02-04 NOTE — Progress Notes (Signed)
 [facility] PROGRESS NOTE   Carla Little FMW:[id number] DOB: [birth year] DOA: 01/29/2024  PCP: Patient, No Pcp Per  Brief History: 33 y.o. female, with medical history significant of GERD, NASH, CIDP, depression, morbid obesity, recent right knee arthroscopy with anterior and posterior cruciate ligament reconstruction with allograft 01/14/2024 by Dr. Gonzella, and another hospital stay from 10/24 till 10/27 for pneumonia. Patient sent from facility due to complaints of shortness of breath and knee pain, patient finished her antibiotic course few days ago, but she was noted to have some erythema to her left knee and her p.o. antibiotics were resumed, as well she does report dyspnea, but she denies any chest pain, unsure of any fevers, during recent hospitalization she was discharged on 3 L nasal cannula which seems to be stable, so she was sent to ED for further evaluation.  She was hospitalized for further management of cellulitis.  Consultants: Orthopedics  Procedures: None    Subjective/Interval History: Patient does not know the reason why she had a brief episode of diaphoresis yesterday.  She thinks it could have had something to do with her low oxygen level.  No further episodes.  Left leg is less swollen.  Erythema has improved.  She request to be on a regular mattress instead of an air mattress.  She was told the reason why she is on air mattress but she is still adamant about switching to a regular bed.  Will communicate with nursing staff.  Assessment/Plan:  Left lower extremity cellulitis This is in the setting of recent left knee surgery for ACL reconstruction.  Seen by orthopedics with no concern for septic arthritis.  No concern for incision site infection. Ultrasound did not show any DVT. Patient was started on cefazolin .  WBC has been normal.  She is afebrile. Despite 2 to 3 days of treatment with cefazolin  there was no significant improvement in erythema.   Vancomycin was added yesterday.  Left leg does look better today.  Continue vancomycin and cefazolin  for now. Continue with low-dose furosemide  for additional day.  Lower extremity swelling has improved. Blood pressure noted to be stable. If she continues to improve then she could be transitioned to linezolid at discharge.  Recent left knee surgery Stable per orthopedics.  Continue Xarelto for DVT prophylaxis.  Elevated D-dimer Nonspecific.  No DVT or PE identified.  Could be elevated from acute infection.  Recent HCAP/obesity hypoventilation syndrome/obstructive sleep apnea/hypoxia Recent hospitalization for pneumonia noted.  Requiring oxygen on and off.  Her obesity is probably contributing as well.   CT angiogram did not show any PE. Respiratory status noted to be stable.  Hypotension Blood pressure improved with IV fluids and then became low again in the setting of beta-blocker and furosemide .  Now stable again.  Will not give additional doses of furosemide  after today.  Continue with low-dose metoprolol .    Normocytic anemia Stable hemoglobin noted.  No evidence of overt bleeding.  Elevated TSH TSH was 7.3.  Free T4 is normal at 0.76.  Free T3 is normal at 2.8.  History of SVT Mild sinus tachycardia noted.   Used to be on beta-blocker previously for SVT but was held due to low blood pressures.  Metoprolol  reinitiated at low-dose.   Morbid obesity Estimated body mass index is 60.08 kg/m as calculated from the following:   Height as of this encounter: 5' 4 (1.626 m).   Weight as of this encounter: 158.8 kg.  DVT Prophylaxis: On Xarelto for DVT prophylaxis Code  Status: Full code Family Communication: Discussed with patient Disposition Plan: Hopefully return back to facility when improved     Medications: Scheduled:  buPROPion   300 mg Oral Daily   busPIRone   20 mg Oral TID   Chlorhexidine  Gluconate Cloth  6 each Topical Daily   [START ON 02/20/2024] cyanocobalamin    1,000 mcg Intramuscular Q30 days   doxepin  25 mg Oral QHS   folic acid   1 mg Oral Daily   furosemide   20 mg Oral Daily   linaclotide   290 mcg Oral QAC breakfast   metoprolol  tartrate  12.5 mg Oral BID   pantoprazole   40 mg Oral Daily   potassium chloride   40 mEq Oral Daily   pregabalin   100 mg Oral TID   rivaroxaban  10 mg Oral Daily   saccharomyces boulardii  250 mg Oral BID   sodium chloride  flush  10-40 mL Intracatheter Q12H   venlafaxine XR  225 mg Oral Daily   Continuous:   ceFAZolin  (ANCEF ) IV 2 g (02/04/24 0626)   vancomycin 1,750 mg (02/04/24 0022)   PRN:acetaminophen , diphenhydrAMINE , loperamide, melatonin, methocarbamol , ondansetron  (ZOFRAN ) IV, oxyCODONE , polyethylene glycol, sodium chloride  flush  Antibiotics: Anti-infectives (From admission, onward)    Start     Dose/Rate Route Frequency Ordered Stop   02/03/24 2200  vancomycin (VANCOREADY) IVPB 1750 mg/350 mL        1,750 mg 175 mL/hr over 120 Minutes Intravenous Every 12 hours 02/03/24 0915     02/03/24 1015  vancomycin (VANCOREADY) IVPB 2000 mg/400 mL        2,000 mg 200 mL/hr over 120 Minutes Intravenous  Once 02/03/24 0915 02/03/24 1213   01/30/24 0000  ceFAZolin  (ANCEF ) IVPB 2g/100 mL premix        2 g 200 mL/hr over 30 Minutes Intravenous Every 8 hours 01/29/24 2138     01/29/24 1415  ceFAZolin  (ANCEF ) IVPB 2g/100 mL premix        2 g 200 mL/hr over 30 Minutes Intravenous  Once 01/29/24 1412 01/29/24 1746       Objective:  Vital Signs  Vitals:   02/03/24 1300 02/03/24 1302 02/03/24 2112 02/04/24 0641  BP: 111/77  124/71 108/68  Pulse: 87  87 97  Resp:   18 17  Temp: 98.1 F (36.7 C)  97.9 F (36.6 C) 98.4 F (36.9 C)  TempSrc: Oral  Oral Oral  SpO2: (!) 86% 96% 93% 97%  Weight:      Height:        Intake/Output Summary (Last 24 hours) at 02/04/2024 0914 Last data filed at 02/04/2024 9373 Gross per 24 hour  Intake 600 ml  Output 3160 ml  Net -2560 ml   Filed Weights   01/29/24  1136  Weight: (!) 158.8 kg    General appearance: Awake alert.  In no distress Resp: Clear to auscultation bilaterally.  Normal effort Cardio: S1-S2 is normal regular.  No S3-S4.  No rubs murmurs or bruit GI: Abdomen is soft.  Nontender nondistended.  Bowel sounds are present normal.  No masses organomegaly Extremities: Improved erythema left lower extremity.  Less swollen as well.  Lab Results:  Data Reviewed: I have personally reviewed following labs and reports of the imaging studies  CBC: Recent Labs  Lab 01/29/24 1236 01/30/24 0449 02/02/24 0251 02/04/24 0115  WBC 6.5 5.9 6.1 5.5  HGB 10.6* 10.6* 10.1* 10.8*  HCT 36.4 35.6* 34.7* 36.1  MCV 92.9 91.0 90.8 88.3  PLT 396 459* 392 382  Basic Metabolic Panel: Recent Labs  Lab 01/29/24 1236 01/30/24 0449 01/31/24 0205 02/01/24 0230 02/02/24 0251 02/03/24 0311  NA 141 141 140  --  139 139  K 4.0 3.7 3.5  --  3.7 3.8  CL 102 101 100  --  98 100  CO2 31 34* 31  --  32 31  GLUCOSE 112* 79 107*  --  100* 85  BUN 5* <5* <5*  --  <5* <5*  CREATININE 0.58 0.56 0.59  --  0.66 0.66  CALCIUM  8.2* 8.4* 7.8*  --  8.0* 8.4*  MG  --   --  1.8 1.9 1.9  --     GFR: Estimated Creatinine Clearance: 149.2 mL/min (by C-G formula based on SCr of 0.66 mg/dL).   Radiology Studies: No results found.      LOS: 6 days   Emmamae Mcnamara [employer] on [email or web address]  02/04/2024, 9:14 AM

## 2024-02-04 NOTE — TOC Progression Note (Signed)
 Transition of Care [facility]) - Progression Note    Patient Details  Name: Carla Little MRN: [id number] Date of Birth: 1990/08/18  Transition of Care [facility]) CM/SW Contact  Daron Breeding [name], [place] Phone Number: 02/04/2024, 2:29 PM  Clinical Narrative:    CSW messaged Tammy with [facility] to inquire on pt returning. Pt is from [facility] and is recommended SNF following hospital DC. Tammy said pt is able to return following dc from hospital. CSW will continue to follow.   Expected Discharge Plan: Long Term Nursing Home Barriers to Discharge: Barriers Resolved               Expected Discharge Plan and Services In-house Referral: Clinical Social Work Discharge Planning Services: CM Consult Post Acute Care Choice: Skilled Nursing Facility, Nursing Home Living arrangements for the past 2 months: Skilled Nursing Facility                                       Social Drivers of Health (SDOH) Interventions SDOH Screenings   Food Insecurity: No Food Insecurity (01/30/2024)  Housing: Low Risk  (01/30/2024)  Transportation Needs: No Transportation Needs (01/31/2024)  Utilities: Not At Risk (01/30/2024)  Tobacco Use: Low Risk  (01/29/2024)    Readmission Risk Interventions    01/30/2024    8:54 AM 01/20/2024   12:00 PM 01/19/2024    8:45 AM  Readmission Risk Prevention Plan  Transportation Screening Complete Complete Complete  PCP or Specialist Appt within 3-5 Days   Complete  HRI or Home Care Consult Complete Complete Complete  Social Work Consult for Recovery Care Planning/Counseling Complete Complete Complete  Palliative Care Screening Not Applicable Not Applicable Not Applicable  Medication Review Oceanographer) Complete Complete Complete

## 2024-02-05 ENCOUNTER — Inpatient Hospital Stay (HOSPITAL_COMMUNITY)

## 2024-02-05 DIAGNOSIS — Z6841 Body Mass Index (BMI) 40.0 and over, adult: Secondary | ICD-10-CM | POA: Diagnosis not present

## 2024-02-05 DIAGNOSIS — L03116 Cellulitis of left lower limb: Secondary | ICD-10-CM | POA: Diagnosis not present

## 2024-02-05 LAB — BASIC METABOLIC PANEL WITH GFR
Anion gap: 11 (ref 5–15)
BUN: 7 mg/dL (ref 6–20)
CO2: 31 mmol/L (ref 22–32)
Calcium: 8.4 mg/dL — ABNORMAL LOW (ref 8.9–10.3)
Chloride: 100 mmol/L (ref 98–111)
Creatinine, Ser: 0.74 mg/dL (ref 0.44–1.00)
GFR, Estimated: >60 mL/min (ref >60)
Glucose, Bld: 112 mg/dL — ABNORMAL HIGH (ref 70–99)
Potassium: 3.9 mmol/L (ref 3.5–5.1)
Sodium: 142 mmol/L (ref 135–145)

## 2024-02-05 LAB — MAGNESIUM: Magnesium: 1.8 mg/dL (ref 1.7–2.4)

## 2024-02-05 MED ORDER — METOPROLOL TARTRATE 12.5 MG HALF TABLET: 12.5000 mg | ORAL_TABLET | Freq: Two times a day (BID) | ORAL | Status: DC | PRN

## 2024-02-05 MED ORDER — ORAL CARE MOUTH RINSE: 15.0000 mL | OROMUCOSAL | Status: DC | PRN

## 2024-02-05 NOTE — Progress Notes (Signed)
 Occupational Therapy Treatment Patient Details Name: Carla Little MRN: [id number] DOB: 1991-01-24 Today's Date: 02/05/2024   History of present illness Pt is a 33 y.o. female admitted 01/29/24 for LLE cellulitis. 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 Of note, recent hospitalization 10/24-27 for PNA.   OT comments  Pt progressing towards OT goals. Focus of session on progressing functional bed mobility as a precursor to engagement in ADL tasks OOB and addressing core and BUE strength to assist with transfers and task engagement. Pt required Mod A +2 for mobility and continues to require up to total A for ADL engagement. OT to continue to follow Pt acutely to maximize independence in occupations. Continue per POC.       If plan is discharge home, recommend the following:  Two people to help with walking and/or transfers;A lot of help with bathing/dressing/bathroom;Assistance with cooking/housework;Assist for transportation;Help with stairs or ramp for entrance   Equipment Recommendations  Other (comment) (defer to next venue)    Recommendations for Other Services      Precautions / Restrictions Precautions Precautions: Fall Recall of Precautions/Restrictions: Intact Restrictions Weight Bearing Restrictions Per Provider Order: Yes LLE Weight Bearing Per Provider Order: Non weight bearing       Mobility Bed Mobility Overal bed mobility: Needs Assistance Bed Mobility: Rolling, Supine to Sit, Sit to Supine Rolling: Contact guard assist, Used rails   Supine to sit: HOB elevated, Used rails, Mod assist, +2 for physical assistance Sit to supine: +2 for physical assistance, Mod  assist, Used rails   General bed mobility comments: Pt rolled to R and L sides. Pt attempted to sit on L side of bed. Pt unable to balance while sitting EOB this date, stating she felt as if she was going to slide off. OT encouraged Pt to place feet on floor for support, Pt with increased fear of falling and apprehensive to bedside movement. Pt able to scoot hips back onto bed. Pt able to return to supine with Mod A management of BLE. With bed in trendelenburg positon, Pt assist in pulling herslef towards top of bed. Pt brought into long sitting position with HOB raised and utilized BUE to full trunk forward off of bed to promote trunk engagement and UB strengthening to assist with transfer abilites.    Transfers                   General transfer comment: Sit to stand transfer not assessed this date     Balance Overall balance assessment: Needs assistance Sitting-balance support: Feet unsupported, Bilateral upper extremity supported Sitting balance-Leahy Scale: Fair Sitting balance - Comments: Pt able to maintain EOB sitting briefly, decreased endurance to maintain sitting position.                                   ADL either performed or assessed with clinical judgement   ADL Overall ADL's : Needs assistance/impaired                 Upper Body Dressing : Set up;Sitting   Lower Body Dressing: Total assistance;Bed level       Toileting- Clothing Manipulation and Hygiene: Total assistance  Extremity/Trunk Assessment Upper Extremity Assessment Upper Extremity Assessment: Overall WFL for tasks assessed;Right hand dominant            Vision       Perception     Praxis     Communication Communication Communication: No apparent difficulties   Cognition Arousal: Alert Behavior During Therapy: WFL for tasks assessed/performed Cognition: No apparent impairments                               Following commands:  Intact        Cueing   Cueing Techniques: Verbal cues, Gestural cues  Exercises      Shoulder Instructions       General Comments Pt pleasant and agreeable to OT session, motivated to participate. Assisted Pt in changing to bariatric gown.    Pertinent Vitals/ Pain       Pain Assessment Pain Assessment: 0-10 Pain Score: 8  Pain Location: LLE Pain Descriptors / Indicators: Discomfort, Grimacing Pain Intervention(s): Limited activity within patient's tolerance, Monitored during session, Repositioned  Home Living Family/patient expects to be discharged to:: Skilled nursing facility                                        Prior Functioning/Environment              Frequency  Min 2X/week        Progress Toward Goals  OT Goals(current goals can now be found in the care plan section)  Progress towards OT goals: Progressing toward goals  Acute Rehab OT Goals Patient Stated Goal: go back to rehab OT Goal Formulation: With patient Time For Goal Achievement: 02/14/24 Potential to Achieve Goals: Good ADL Goals Pt Will Perform Lower Body Bathing: with supervision;sitting/lateral leans;sit to/from stand;with adaptive equipment Pt Will Perform Lower Body Dressing: with supervision;sit to/from stand;sitting/lateral leans;with adaptive equipment Pt Will Transfer to Toilet: with min assist;stand pivot transfer;bedside commode Pt Will Perform Toileting - Clothing Manipulation and hygiene: sitting/lateral leans;sit to/from stand;with adaptive equipment;with min assist Additional ADL Goal #1: Patient will be able to complete bed mobility at min A level as a precursor to OOB movement.  Plan      Co-evaluation                 AM-PAC OT 6 Clicks Daily Activity     Outcome Measure   Help from another person eating meals?: A Little Help from another person taking care of personal grooming?: A Little Help from another person toileting, which includes  using toliet, bedpan, or urinal?: Total Help from another person bathing (including washing, rinsing, drying)?: A Lot Help from another person to put on and taking off regular upper body clothing?: A Little Help from another person to put on and taking off regular lower body clothing?: Total 6 Click Score: 13    End of Session    OT Visit Diagnosis: Unsteadiness on feet (R26.81);Other abnormalities of gait and mobility (R26.89);Muscle weakness (generalized) (M62.81);Pain Pain - Right/Left: Left Pain - part of body: Leg   Activity Tolerance Patient tolerated treatment well   Patient Left in bed;with call bell/phone within reach   Nurse Communication          Time: 8545-8478 OT Time Calculation (min): 27 min  Charges: OT General Charges $OT Visit: 1 Visit OT Treatments $Therapeutic Activity:  23-37 mins  Maurilio CROME, OTR/LSABRA  [facility] Acute Rehabilitation  Office: [phone]   Maurilio PARAS Shaul Trautman 02/05/2024, 4:32 PM

## 2024-02-05 NOTE — Progress Notes (Signed)
 Placed Interdry to bilateral inguinal folds and to fold on the left inner knee area. Pt instructed in its use and told her to take with her once discharged.

## 2024-02-05 NOTE — Progress Notes (Signed)
 [facility]                                                                               Carla Little, is a 33 y.o. female, DOB - Jan 16, 1991, FMW:[id number] Admit date - 01/29/2024    Outpatient Primary MD for the patient is Patient, No Pcp Per  LOS - 7  days    Brief summary    33 y.o. female, with medical history significant of GERD, NASH, CIDP, depression, morbid obesity, recent right knee arthroscopy with anterior and posterior cruciate ligament reconstruction with allograft 01/14/2024 by Dr. Gonzella, and another hospital stay from 10/24 till 10/27 for pneumonia. Patient sent from facility due to complaints of shortness of breath and knee pain, patient finished her antibiotic course few days ago, but she was noted to have some erythema to her left knee and her p.o. antibiotics were resumed, as well she does report dyspnea, but she denies any chest pain, unsure of any fevers, during recent hospitalization she was discharged on 3 L nasal cannula which seems to be stable, so she was sent to ED for further evaluation.  She was hospitalized for further management of cellulitis.    Assessment & Plan   Left lower extremity cellulitis Occurred right after recent left knee surgery for ACL reconstruction. Patient was seen by orthopedics suggested no signs of septic arthritis. No concern for incision site infection. Venous duplex of the lower extremity did not show any DVT.  Patient was started on cefazolin , after 3 days of treatment patient's leg remained erythematous and tender hence vancomycin was added on 11/10, with much improvement in the erythema, tenderness.  Will discharge her tomorrow on linezolid to complete the course for cellulitis.    Recent left knee surgery Continue with Xarelto for DVT prophylaxis   Elevated D-dimer probably from an acute infection.   Hypotension Resolved  Anemia of chronic disease No evidence of Bleeding   Elevated TSH Free  T4 and T3 are normal Recommend checking thyroid  panel in 6 weeks   Body mass index is 60.08 kg/m. Morbid obesity    History of SVT Patient had episodes of borderline low blood pressure parameters, hence  will change the metoprolol  to as needed.   Estimated body mass index is 60.08 kg/m as calculated from the following:   Height as of this encounter: 5' 4 (1.626 m).   Weight as of this encounter: 158.8 kg.  Code Status: Full code DVT Prophylaxis:  rivaroxaban (XARELTO) tablet 10 mg Start: 01/29/24 2200 rivaroxaban (XARELTO) tablet 10 mg   Level of Care: Level of care: Med-Surg Family Communication: None at bedside  Disposition Plan:     Remains inpatient appropriate:  possible in am  Procedures:  None.   Consultants:   None.   Antimicrobials:   Anti-infectives (From admission, onward)    Start     Dose/Rate Route Frequency Ordered Stop   02/03/24 2200  vancomycin (VANCOREADY) IVPB 1750 mg/350 mL        1,750 mg 175 mL/hr over 120 Minutes Intravenous Every 12 hours 02/03/24 0915     02/03/24 1015  vancomycin (VANCOREADY) IVPB 2000 mg/400 mL  2,000 mg 200 mL/hr over 120 Minutes Intravenous  Once 02/03/24 0915 02/03/24 1213   01/30/24 0000  ceFAZolin  (ANCEF ) IVPB 2g/100 mL premix        2 g 200 mL/hr over 30 Minutes Intravenous Every 8 hours 01/29/24 2138     01/29/24 1415  ceFAZolin  (ANCEF ) IVPB 2g/100 mL premix        2 g 200 mL/hr over 30 Minutes Intravenous  Once 01/29/24 1412 01/29/24 1746        Medications  Scheduled Meds:  buPROPion   300 mg Oral Daily   busPIRone   20 mg Oral TID   Chlorhexidine  Gluconate Cloth  6 each Topical Daily   [START ON 02/20/2024] cyanocobalamin   1,000 mcg Intramuscular Q30 days   doxepin  25 mg Oral QHS   folic acid   1 mg Oral Daily   linaclotide   290 mcg Oral QAC breakfast   metoprolol  tartrate  12.5 mg Oral BID   pantoprazole   40 mg Oral Daily   pregabalin   100 mg Oral TID   rivaroxaban  10 mg Oral Daily    saccharomyces boulardii  250 mg Oral BID   sodium chloride  flush  10-40 mL Intracatheter Q12H   venlafaxine XR  225 mg Oral Daily   Continuous Infusions:   ceFAZolin  (ANCEF ) IV 2 g (02/05/24 1610)   vancomycin 1,750 mg (02/05/24 1044)   PRN Meds:.acetaminophen , diphenhydrAMINE , loperamide, melatonin, methocarbamol , ondansetron  (ZOFRAN ) IV, mouth rinse, oxyCODONE , polyethylene glycol, sodium chloride  flush    Subjective:   Carla Little was seen and examined today.  In pleasant mood, no new complaints. Does not want to go home today.   Objective:   Vitals:   02/04/24 1826 02/04/24 2050 02/05/24 0537 02/05/24 1035  BP: 93/73 123/78 111/67 91/74  Pulse:  92 93 92  Resp: 18 20 17 17   Temp: 98.4 F (36.9 C) 98.2 F (36.8 C) 98.8 F (37.1 C) 97.8 F (36.6 C)  TempSrc:  Oral Oral Oral  SpO2: 91% 96% 94% 98%  Weight:      Height:        Intake/Output Summary (Last 24 hours) at 02/05/2024 1718 Last data filed at 02/05/2024 0600 Gross per 24 hour  Intake 2670 ml  Output 500 ml  Net 2170 ml   Filed Weights   01/29/24 1136  Weight: (!) 158.8 kg     Exam General exam: Appears calm and comfortable  Respiratory system: Clear to auscultation. Respiratory effort normal. Cardiovascular system: S1 & S2 heard, RRR. No JVD, Gastrointestinal system: Abdomen is nondistended, soft and nontender. Central nervous system: Alert and oriented.  Extremities: left lower extremity redness and tender ness improving but not resolved Skin: No rashes,  Psychiatry:Mood & affect appropriate.     Data Reviewed:  I have personally reviewed following labs and imaging studies   CBC Lab Results  Component Value Date   WBC 5.5 02/04/2024   RBC 4.09 02/04/2024   HGB 10.8 (L) 02/04/2024   HCT 36.1 02/04/2024   MCV 88.3 02/04/2024   MCH 26.4 02/04/2024   PLT 382 02/04/2024   MCHC 29.9 (L) 02/04/2024   RDW 17.0 (H) 02/04/2024   LYMPHSABS 2.0 01/17/2024   MONOABS 1.0 01/17/2024   EOSABS  0.0 01/17/2024   BASOSABS 0.0 01/17/2024     Last metabolic panel Lab Results  Component Value Date   NA 142 02/05/2024   K 3.9 02/05/2024   CL 100 02/05/2024   CO2 31 02/05/2024   BUN 7 02/05/2024  CREATININE 0.74 02/05/2024   GLUCOSE 112 (H) 02/05/2024   GFRNONAA >60 02/05/2024   GFRAA >60 05/16/2018   CALCIUM  8.4 (L) 02/05/2024   PHOS 2.4 (L) 01/18/2024   PROT 6.9 01/18/2024   ALBUMIN  3.4 (L) 01/18/2024   BILITOT 0.9 01/18/2024   ALKPHOS 146 (H) 01/18/2024   AST 25 01/18/2024   ALT 23 01/18/2024   ANIONGAP 11 02/05/2024    CBG (last 3)  No results for input(s): GLUCAP in the last 72 hours.    Coagulation Profile: No results for input(s): INR, PROTIME in the last 168 hours.   Radiology Studies: No results found.     Elgie Butter M.D. [facility] 02/05/2024, 5:18 PM  Available via Epic secure chat 7am-7pm After 7 pm, please refer to night coverage provider listed on amion.

## 2024-02-05 NOTE — Plan of Care (Signed)
 Pt states the Dr. Sanda she may be able to be discharged tomorrow to the facility.

## 2024-02-05 NOTE — Care Management Important Message (Signed)
 Important Message  Patient Details  Name: Carla Little MRN: [id number] Date of Birth: [birth year]   Important Message Given:  Yes - Medicare IM     Luann SHAUNNA Cumming, LCSW 02/05/2024, 9:54 AM

## 2024-02-06 ENCOUNTER — Telehealth (HOSPITAL_COMMUNITY): Payer: Self-pay | Admitting: Pharmacy Technician

## 2024-02-06 ENCOUNTER — Telehealth (HOSPITAL_COMMUNITY): Payer: Self-pay

## 2024-02-06 ENCOUNTER — Other Ambulatory Visit (HOSPITAL_COMMUNITY): Payer: Self-pay

## 2024-02-06 DIAGNOSIS — S83512D Sprain of anterior cruciate ligament of left knee, subsequent encounter: Secondary | ICD-10-CM | POA: Diagnosis not present

## 2024-02-06 DIAGNOSIS — L03116 Cellulitis of left lower limb: Secondary | ICD-10-CM | POA: Diagnosis not present

## 2024-02-06 DIAGNOSIS — K2100 Gastro-esophageal reflux disease with esophagitis, without bleeding: Secondary | ICD-10-CM | POA: Diagnosis not present

## 2024-02-06 MED ORDER — CEPHALEXIN 500 MG PO CAPS: 500.0000 mg | ORAL_CAPSULE | Freq: Two times a day (BID) | ORAL | 0 refills | Status: AC

## 2024-02-06 MED ORDER — RIVAROXABAN 10 MG PO TABS: 10.0000 mg | ORAL_TABLET | Freq: Every day | ORAL | 0 refills | Status: AC

## 2024-02-06 MED ORDER — LINEZOLID 600 MG PO TABS: 600.0000 mg | ORAL_TABLET | Freq: Two times a day (BID) | ORAL | 0 refills | Status: AC

## 2024-02-06 MED ORDER — MELATONIN 5 MG PO CHEW: 10.0000 mg | CHEWABLE_TABLET | Freq: Every day | ORAL | 0 refills | Status: AC

## 2024-02-06 MED ORDER — SACCHAROMYCES BOULARDII 250 MG PO CAPS: 250.0000 mg | ORAL_CAPSULE | Freq: Two times a day (BID) | ORAL | 0 refills | Status: AC

## 2024-02-06 MED ORDER — METOPROLOL TARTRATE 25 MG PO TABS: 12.5000 mg | ORAL_TABLET | Freq: Two times a day (BID) | ORAL | 0 refills | Status: AC | PRN

## 2024-02-06 NOTE — Progress Notes (Signed)
 RIJ TL CVC removed per protocol per MD order. Manual pressure applied for 5 mins. Vaseline gauze, gauze, and Medipore applied over insertion site; small piece of Tegaderm applied over Medipore only as patient has sensitivity to Tegaderm directly on skin. No bleeding or swelling noted. Instructed patient to remain in bed for thirty mins. Educated patient about S/S of infection and when to call MD; no heavy lifting or pressure on right side for 24 hours; keep dressing dry and intact for 24 hours. Pt verbalized comprehension.

## 2024-02-06 NOTE — Telephone Encounter (Signed)
 Patient Product/process Development Scientist completed.    The patient is insured through [employer]. Patient has Medicare and is not eligible for a copay card, but may be able to apply for patient assistance or Medicare RX Payment Plan (Patient Must reach out to their plan, if eligible for payment plan), if available.    Ran test claim for Xarelto 10 mg and the current 30 day co-pay is $0.00.   This test claim was processed through [facility]- copay amounts may vary at other pharmacies due to pharmacy/plan contracts, or as the patient moves through the different stages of their insurance plan.     Reyes Sharps, CPHT Pharmacy Technician Patient Advocate Specialist Lead [facility] Patient Advocate Team Direct Number: [phone]  Fax: [phone]

## 2024-02-06 NOTE — Telephone Encounter (Signed)
 Pharmacy Patient Advocate Encounter  Insurance verification completed.    The patient is insured through [employer]. Patient has Medicare and is not eligible for a copay card, but may be able to apply for patient assistance or Medicare RX Payment Plan (Patient Must reach out to their plan, if eligible for payment plan), if available.    Ran test claim for Linezolid 600mg  and the current 30 day co-pay is $0.   This test claim was processed through [facility]- copay amounts may vary at other pharmacies due to [employer], or as the patient moves through the different stages of their insurance plan.

## 2024-02-06 NOTE — Progress Notes (Signed)
 PT Cancellation Note  Patient Details Name: Carla Little MRN: [id number] DOB: Feb 02, 1991   Cancelled Treatment:    Reason Eval/Treat Not Completed: (P) Patient declined, no reason specified, pt declined all mobility, requesting to rest prior to d/c and transfer. Will check back as schedule allows to continue with PT POC.  Therisa SAUNDERS. PTA Acute Rehabilitation Services Office: [phone]    Therisa CHRISTELLA Boor 02/06/2024, 2:02 PM

## 2024-02-06 NOTE — TOC Transition Note (Signed)
 Transition of Care [facility]) - Discharge Note   Patient Details  Name: Carla Little MRN: [id number] Date of Birth: 1990/07/10  Transition of Care [facility]) CM/SW Contact:  Gwenn Julien [place], [place] Phone Number: 02/06/2024, 2:01 PM   Clinical Narrative: Pt for dc back to [facility] where she is a LTC resident. Spoke to [place] in admissions who confirmed pt is able to return to room A22-1. Pt aware of dc and reports agreeable. RN provided with number for report and PTAR arranged for transport. SW signing off at dc.   Julien Gwenn, MSW, LCSW [phone] (coverage)        Final next level of care: Skilled Nursing Facility Barriers to Discharge: Barriers Resolved   Patient Goals and CMS Choice Patient states their goals for this hospitalization and ongoing recovery are:: return to LTC CMS Medicare.gov Compare Post Acute Care list provided to:: Patient Choice offered to / list presented to : Patient      Discharge Placement              Patient chooses bed at: Other - please specify in the comment section below: [facility]) Patient to be transferred to facility by: PTAR Name of family member notified: Pt to update family Patient and family notified of of transfer: 02/06/24  Discharge Plan and Services Additional resources added to the After Visit Summary for   In-house Referral: Clinical Social Work Discharge Planning Services: CM Consult Post Acute Care Choice: Skilled Nursing Facility, Nursing Home                               Social Drivers of Health (SDOH) Interventions SDOH Screenings   Food Insecurity: No Food Insecurity (01/30/2024)  Housing: Low Risk  (01/30/2024)  Transportation Needs: No Transportation Needs (01/31/2024)  Utilities: Not At Risk (01/30/2024)  Tobacco Use: Low Risk  (01/29/2024)     Readmission Risk Interventions    01/30/2024    8:54 AM 01/20/2024   12:00 PM 01/19/2024    8:45 AM  Readmission Risk Prevention Plan   Transportation Screening Complete Complete Complete  PCP or Specialist Appt within 3-5 Days   Complete  HRI or Home Care Consult Complete Complete Complete  Social Work Consult for Recovery Care Planning/Counseling Complete Complete Complete  Palliative Care Screening Not Applicable Not Applicable Not Applicable  Medication Review Oceanographer) Complete Complete Complete

## 2024-02-06 NOTE — Plan of Care (Signed)

## 2024-02-06 NOTE — Discharge Summary (Signed)
 Physician Discharge Summary   Patient: Carla Little MRN: [id number] DOB: Apr 26, 1990  Admit date:     01/29/2024  Discharge date: 02/06/24  Discharge Physician: Elgie Butter   PCP: Patient, No Pcp Per   Recommendations at discharge:  Please follow up with Dr Genelle in 2 weeks as recommended.  Please follow up with PCP in one week before the antibiotics are completed.   Discharge Diagnoses: Principal Problem:   Cellulitis of left knee Active Problems:   Left knee dislocation   GERD (gastroesophageal reflux disease)   Depression   Morbid obesity with BMI of 60.0-69.9, adult (HCC)   ACL tear    Hospital Course:  33 y.o. female, with medical history significant of GERD, NASH, CIDP, depression, morbid obesity, recent right knee arthroscopy with anterior and posterior cruciate ligament reconstruction with allograft 01/14/2024 by Dr. Gonzella, and another hospital stay from 10/24 till 10/27 for pneumonia. Patient sent from facility due to complaints of shortness of breath and knee pain, patient finished her antibiotic course few days ago, but she was noted to have some erythema to her left knee and her p.o. antibiotics were resumed, as well she does report dyspnea, but she denies any chest pain, unsure of any fevers, during recent hospitalization she was discharged on 3 L nasal cannula which seems to be stable, so she was sent to ED for further evaluation.  She was hospitalized for further management of cellulitis.    Assessment and Plan:  Left lower extremity cellulitis Occurred after recent left knee surgery for ACL reconstruction. Patient was seen by orthopedics, suggested no signs of septic arthritis. No concern for incision site infection. Venous duplex of the lower extremity did not show any DVT.  Patient was started on cefazolin , after 3 days of treatment patient's leg remained erythematous and tender hence vancomycin was added on 11/10, with much improvement in the erythema,  tenderness.  Will discharge her  on linezolid to complete the course for cellulitis. She is recommended        Recent left knee surgery on 10/21 Right knee ACL reconstruction, PCL reconstruction, posterior lateral counter repair, MPFL reconstruction  She was seen by Dr Buren on 10/31 , where she was recommended to continue with non weight bearing for another 2 weeks followed by partial weightbearing 50%, recommended to follow up with orthopedics 4 weeks for reassessment from 10/31. Continue with Xarelto for 2 weeks for DVT prophylaxis until she sees Dr Genelle.  Pleas see Dr Danetta note for further details.      Elevated D-dimer probably from an acute infection.     Hypotension Resolved   Anemia of chronic disease No evidence of Bleeding     Elevated TSH Free T4 and T3 are normal Recommend checking thyroid  panel in 6 weeks     Body mass index is 60.08 kg/m. Morbid obesity       History of SVT Patient had episodes of borderline low blood pressure parameters, hence  will change the metoprolol  to as needed.     Estimated body mass index is 60.08 kg/m as calculated from the following:   Height as of this encounter: 5' 4 (1.626 m).   Weight as of this encounter: 158.8 kg.     Consultants: orthopedics.  Procedures performed: none.   Disposition: Skilled nursing facility Diet recommendation:  Discharge Diet Orders (From admission, onward)     Start     Ordered   02/06/24 0000  Diet - low sodium heart healthy  02/06/24 1304           Regular diet DISCHARGE MEDICATION: Allergies as of 02/06/2024       Reactions   Azithromycin  Shortness Of Breath, Nausea And Vomiting        Medication List     STOP taking these medications    doxycycline 100 MG capsule Commonly known as: MONODOX   fluconazole  200 MG tablet Commonly known as: DIFLUCAN    mupirocin ointment 2 % Commonly known as: BACTROBAN   nystatin  powder Commonly known as:  MYCOSTATIN /NYSTOP        TAKE these medications    acetaminophen  325 MG tablet Commonly known as: TYLENOL  Take 2 tablets (650 mg total) by mouth every 6 (six) hours as needed for mild pain (or Fever >/= 101).   ascorbic acid  500 MG tablet Commonly known as: VITAMIN C Take 500 mg by mouth daily.   buPROPion  300 MG 24 hr tablet Commonly known as: WELLBUTRIN  XL Take 300 mg by mouth daily.   busPIRone  10 MG tablet Commonly known as: BUSPAR  Take 20 mg by mouth 3 (three) times daily.   cephALEXin  500 MG capsule Commonly known as: KEFLEX  Take 1 capsule (500 mg total) by mouth 2 (two) times daily for 7 days.   cholecalciferol  25 MCG (1000 UNIT) tablet Commonly known as: VITAMIN D3 Take 2,000 Units by mouth in the morning and at bedtime.   cyanocobalamin  1000 MCG/ML injection Commonly known as: VITAMIN B12 Inject 1 mL (1,000 mcg total) into the muscle every 30 (thirty) days.   doxepin 25 MG capsule Commonly known as: SINEQUAN Take 25 mg by mouth at bedtime.   fluticasone 50 MCG/ACT nasal spray Commonly known as: FLONASE Place 1 spray into both nostrils daily.   folic acid  1 MG tablet Commonly known as: FOLVITE  Take 1 tablet (1 mg total) by mouth daily.   hydrOXYzine  25 MG tablet Commonly known as: ATARAX  Take 25 mg by mouth at bedtime.   ipratropium-albuterol  0.5-2.5 (3) MG/3ML Soln Commonly known as: DUONEB Take 3 mLs by nebulization 3 (three) times daily.   LACTOBACILLUS PO Take 1 capsule by mouth daily.   linaclotide  290 MCG Caps capsule Commonly known as: LINZESS  Take 1 capsule (290 mcg total) by mouth daily before breakfast.   linezolid 600 MG tablet Commonly known as: ZYVOX Take 1 tablet (600 mg total) by mouth 2 (two) times daily for 7 days.   loperamide 2 MG capsule Commonly known as: IMODIUM Take 2 mg by mouth every 6 (six) hours as needed for diarrhea or loose stools.   Melatonin 5 MG Chew Chew 10 mg by mouth at bedtime. [place] Dreamy Sleep  Gummies   methocarbamol  500 MG tablet Commonly known as: ROBAXIN  Take 1 tablet (500 mg total) by mouth every 6 (six) hours as needed for muscle spasms.   metoprolol  tartrate 25 MG tablet Commonly known as: LOPRESSOR  Take 0.5 tablets (12.5 mg total) by mouth 2 (two) times daily as needed (HR>120).   MULTIVITAMINS PO Take 1 tablet by mouth daily.   Narcan 4 MG/0.1ML Liqd nasal spray kit Generic drug: naloxone Place 1 spray into the nose once as needed (opioid overdose).   omeprazole 20 MG capsule Commonly known as: PRILOSEC Take 20 mg by mouth 2 (two) times daily before a meal.   ondansetron  4 MG tablet Commonly known as: ZOFRAN  Take 4 mg by mouth every 8 (eight) hours as needed for nausea.   Oxycodone  HCl 10 MG Tabs Take 1 tablet (10 mg total)  by mouth every 6 (six) hours as needed (pain).   polyethylene glycol 17 g packet Commonly known as: MIRALAX  / GLYCOLAX  Take 17 g by mouth 2 (two) times daily.   pregabalin  100 MG capsule Commonly known as: LYRICA  Take 100 mg by mouth 3 (three) times daily. 0000, 0800, 1600   pregabalin  25 MG capsule Commonly known as: LYRICA  Take 25 mg by mouth 3 (three) times daily. 0000, 0800, 1600   rivaroxaban 10 MG Tabs tablet Commonly known as: XARELTO Take 1 tablet (10 mg total) by mouth daily for 14 days.   saccharomyces boulardii 250 MG capsule Commonly known as: FLORASTOR Take 1 capsule (250 mg total) by mouth 2 (two) times daily.   senna-docusate 8.6-50 MG tablet Commonly known as: Senokot-S Take 1 tablet by mouth at bedtime.   Venlafaxine HCl 225 MG Tb24 Take 1 tablet by mouth daily.        Follow-up Information     Genelle Standing, MD Follow up in 2 week(s).   Specialty: Orthopedic Surgery Contact information: [address] [place] [address] [phone]                Discharge Exam: Fredricka Weights   01/29/24 1136  Weight: (!) 158.8 kg   General exam: Appears calm and comfortable   Respiratory system: Clear to auscultation. Respiratory effort normal. Cardiovascular system: S1 & S2 heard, RRR. Gastrointestinal system: Abdomen is nondistended, soft and nontender.  Central nervous system: Alert and oriented. Extremities: left leg erythema slowly improving, no tenderness in the leg or the knee. Site of surgery remains clean, non erythematous.  Skin: erythema over the left leg improving.     Condition at discharge: fair  The results of significant diagnostics from this hospitalization (including imaging, microbiology, ancillary and laboratory) are listed below for reference.   Imaging Studies: DG CHEST PORT 1 VIEW Result Date: 02/05/2024 CLINICAL DATA:  Central line placement. EXAM: PORTABLE CHEST 1 VIEW COMPARISON:  Chest radiograph dated 01/29/2024. FINDINGS: No acute central venous line with tip at the cavoatrial junction. Shallow inspiration. There is mild cardiomegaly with mild vascular congestion. No consolidative changes, pleural effusion, pneumothorax. No acute osseous pathology. IMPRESSION: Central venous line with tip at the cavoatrial junction. No pneumothorax. Electronically Signed   By: Vanetta Chou M.D.   On: 02/05/2024 20:22   CT Angio Chest PE W and/or Wo Contrast Result Date: 01/29/2024 CLINICAL DATA:  Low oxygenation. EXAM: CT ANGIOGRAPHY CHEST WITH CONTRAST TECHNIQUE: Multidetector CT imaging of the chest was performed using the standard protocol during bolus administration of intravenous contrast. Multiplanar CT image reconstructions and MIPs were obtained to evaluate the vascular anatomy. RADIATION DOSE REDUCTION: This exam was performed according to the departmental dose-optimization program which includes automated exposure control, adjustment of the mA and/or kV according to patient size and/or use of iterative reconstruction technique. CONTRAST:  75mL OMNIPAQUE  IOHEXOL  350 MG/ML SOLN COMPARISON:  January 17, 2024 FINDINGS: Cardiovascular: The  pulmonary arteries are limited in evaluation secondary to suboptimal opacification with intravenous contrast and overlying areas of artifact related to the patient's body habitus. No definite evidence of pulmonary embolism. Normal heart size. No pericardial effusion. Mediastinum/Nodes: No enlarged mediastinal, hilar, or axillary lymph nodes. Thyroid  gland, trachea, and esophagus demonstrate no significant findings. Lungs/Pleura: Mild a ridge of linear scarring and/or atelectasis are seen within the posterior aspects of the bilateral upper lobes and posterolateral bilateral lower lobes. No acute infiltrate, pleural effusion or pneumothorax is identified. Upper Abdomen: No acute abnormality.  Musculoskeletal: No chest wall abnormality. No acute or significant osseous findings. Review of the MIP images confirms the above findings. IMPRESSION: 1. Limited evaluation of the pulmonary arteries without definite evidence of pulmonary embolism. 2. Mild bilateral upper lobe and bilateral lower lobe linear scarring and/or atelectasis. Electronically Signed   By: Suzen Dials M.D.   On: 01/29/2024 21:44   DG Chest Portable 1 View Result Date: 01/29/2024 CLINICAL DATA:  Central line placement. EXAM: PORTABLE CHEST 1 VIEW COMPARISON:  Chest radiograph dated 01/29/2024. FINDINGS: Right IJ central venous line with tip in the region of the cavoatrial junction. Shallow inspiration. No focal consolidation, pleural effusion, pneumothorax. Mild cardiomegaly with mild central vascular congestion. No acute osseous pathology. IMPRESSION: Right IJ central venous line with tip in the region of the cavoatrial junction. No pneumothorax. Electronically Signed   By: Vanetta Chou M.D.   On: 01/29/2024 20:52   US  Venous Img Lower Unilateral Left Result Date: 01/29/2024 CLINICAL DATA:  Left lower extremity pain, knee surgery 2 weeks ago EXAM: LEFT LOWER EXTREMITY VENOUS DOPPLER ULTRASOUND TECHNIQUE: Gray-scale sonography with  compression, as well as color and duplex ultrasound, were performed to evaluate the deep venous system(s) from the level of the common femoral vein through the popliteal and proximal calf veins. COMPARISON:  01/18/2024 FINDINGS: VENOUS Normal compressibility of the common femoral, superficial femoral, and popliteal veins, as well as the visualized calf veins. Visualized portions of profunda femoral vein and great saphenous vein unremarkable. No filling defects to suggest DVT on grayscale or color Doppler imaging. Doppler waveforms show normal direction of venous flow, normal respiratory plasticity and response to augmentation. Limited views of the contralateral common femoral vein are unremarkable. OTHER None. Limitations: Body habitus. IMPRESSION: 1. No evidence of deep venous thrombosis within the left lower extremity. Evaluation is limited due to body habitus. Electronically Signed   By: Ozell Daring M.D.   On: 01/29/2024 17:15   DG Knee Complete 4 Views Left Result Date: 01/29/2024 CLINICAL DATA:  Left knee pain with drainage. EXAM: LEFT KNEE - COMPLETE 4+ VIEW COMPARISON:  12/21/2023. FINDINGS: Recent medial patellofemoral ligament repair 01/14/2024. Image quality is degraded by body habitus. Lateral view fails to image the knee joint entirely, making assessment for effusion difficult. Slight lateral subluxation of the proximal tibia with respect to the distal femur. IMPRESSION: 1. Suboptimal image quality due to body habitus and incomplete imaging of the knee joint on the lateral view. 2. Recent medial patellofemoral ligament repair. 3. No acute findings. Electronically Signed   By: Newell Eke M.D.   On: 01/29/2024 13:43   DG Chest Port 1 View Result Date: 01/29/2024 CLINICAL DATA:  Shortness of breath. EXAM: PORTABLE CHEST 1 VIEW COMPARISON:  01/17/2024 and CT chest 01/17/2024. FINDINGS: Trachea is midline. Heart size stable. Lungs are low in volume but clear. No pleural fluid. Elevated right  hemidiaphragm. IMPRESSION: No acute findings. Electronically Signed   By: Newell Eke M.D.   On: 01/29/2024 13:32   US  EKG SITE RITE Result Date: 01/29/2024 If Site Rite image not attached, placement could not be confirmed due to current cardiac rhythm.  ECHOCARDIOGRAM COMPLETE Result Date: 01/18/2024    ECHOCARDIOGRAM REPORT   Patient Name:   Carla Little Date of Exam: 01/18/2024 Medical Rec #:  [id number]         Height:       64.0 in Accession #:    [id number]        Weight:  350.0 lb Date of Birth:  1991/02/24        BSA:          2.482 m Patient Age:    33 years          BP:           119/74 mmHg Patient Gender: F                 HR:           96 bpm. Exam Location:  [facility] Procedure: 2D Echo, Cardiac Doppler and Color Doppler (Both Spectral and Color            Flow Doppler were utilized during procedure). Indications:    CHF-Acute Systolic I50.21  History:        Patient has no prior history of Echocardiogram examinations.                 Arrythmias:Tachycardia. Hx of Guillain Barr syndrome. COVID-19.                 Morbid Obesity.  Sonographer:    Aida Pizza RCS Referring Phys: [id number] OLADAPO ADEFESO  Sonographer Comments: Image acquisition challenging due to patient body habitus. IMPRESSIONS  1. Left ventricular ejection fraction, by estimation, is 60 to 65%. The left ventricle has normal function. The left ventricle has no regional wall motion abnormalities. There is mild left ventricular hypertrophy. Indeterminate diastolic filling due to E-A fusion.  2. Right ventricular systolic function is normal. The right ventricular size is normal.  3. The mitral valve is grossly normal. Trivial mitral valve regurgitation. No evidence of mitral stenosis.  4. The aortic valve is normal in structure. Aortic valve regurgitation is not visualized. No aortic stenosis is present. FINDINGS  Left Ventricle: Left ventricular ejection fraction, by estimation, is 60 to 65%. The left ventricle has  normal function. The left ventricle has no regional wall motion abnormalities. The left ventricular internal cavity size was normal in size. There is  mild left ventricular hypertrophy. Indeterminate diastolic filling due to E-A fusion. Right Ventricle: The right ventricular size is normal. No increase in right ventricular wall thickness. Right ventricular systolic function is normal. Left Atrium: Left atrial size was normal in size. Right Atrium: Right atrial size was normal in size. Pericardium: Trivial pericardial effusion is present. Mitral Valve: The mitral valve is grossly normal. Trivial mitral valve regurgitation. No evidence of mitral valve stenosis. Tricuspid Valve: The tricuspid valve is normal in structure. Tricuspid valve regurgitation is not demonstrated. No evidence of tricuspid stenosis. Aortic Valve: The aortic valve is normal in structure. Aortic valve regurgitation is not visualized. No aortic stenosis is present. Pulmonic Valve: The pulmonic valve was normal in structure. Pulmonic valve regurgitation is trivial. No evidence of pulmonic stenosis. Aorta: The aortic root is normal in size and structure. Venous: The inferior vena cava was not well visualized. IAS/Shunts: The interatrial septum was not well visualized.  LEFT VENTRICLE PLAX 2D LVIDd:         4.50 cm LVIDs:         2.80 cm LV PW:         1.20 cm LV IVS:        1.10 cm LVOT diam:     1.80 cm LV SV:         48 LV SV Index:   19 LVOT Area:     2.54 cm  RIGHT VENTRICLE RV S prime:     16.33 cm/s  TAPSE (M-mode): 2.5 cm LEFT ATRIUM             Index        RIGHT ATRIUM           Index LA diam:        3.70 cm 1.49 cm/m   RA Area:     13.00 cm LA Vol (A2C):   46.8 ml 18.84 ml/m  RA Volume:   25.00 ml  10.07 ml/m LA Vol (A4C):   57.5 ml 23.17 ml/m LA Biplane Vol: 51.0 ml 20.55 ml/m  AORTIC VALVE LVOT Vmax:   118.00 cm/s LVOT Vmean:  66.950 cm/s LVOT VTI:    0.188 m  AORTA Ao Root diam: 3.40 cm MITRAL VALVE MV Area (PHT): 3.27 cm      SHUNTS MV Decel Time: 232 msec     Systemic VTI:  0.19 m MV E velocity: 127.00 cm/s  Systemic Diam: 1.80 cm MV A velocity: 94.30 cm/s MV E/A ratio:  1.35 Soyla Merck MD Electronically signed by Soyla Merck MD Signature Date/Time: 01/18/2024/5:59:49 PM    Final    US  Venous Img Lower  Left (DVT Study) Result Date: 01/18/2024 CLINICAL DATA:  Recent surgery with elevated D-dimer and left lower extremity swelling. EXAM: LEFT LOWER EXTREMITY VENOUS DOPPLER ULTRASOUND TECHNIQUE: Gray-scale sonography with graded compression, as well as color Doppler and duplex ultrasound were performed to evaluate the lower extremity deep venous systems from the level of the common femoral vein and including the common femoral, femoral, profunda femoral, popliteal and calf veins including the posterior tibial, peroneal and gastrocnemius veins when visible. The superficial great saphenous vein was also interrogated. Spectral Doppler was utilized to evaluate flow at rest and with distal augmentation maneuvers in the common femoral, femoral and popliteal veins. COMPARISON:  None Available. FINDINGS: Contralateral Common Femoral Vein: Respiratory phasicity is normal and symmetric with the symptomatic side. No evidence of thrombus. Normal compressibility. Common Femoral Vein: No evidence of thrombus. Normal compressibility, respiratory phasicity and response to augmentation. Saphenofemoral Junction: No evidence of thrombus. Normal compressibility and flow on color Doppler imaging. Profunda Femoral Vein: No evidence of thrombus. Normal compressibility and flow on color Doppler imaging. Femoral Vein: No evidence of thrombus. Normal compressibility, respiratory phasicity and response to augmentation. Popliteal Vein: No evidence of thrombus. Normal compressibility, respiratory phasicity and response to augmentation. Calf Veins: The LEFT peroneal vein is limited in visualization. No evidence of thrombus within the LEFT posterior tibial  vein. Normal compressibility and flow on color Doppler imaging. Superficial Great Saphenous Vein: No evidence of thrombus. Normal compressibility. Venous Reflux:  None. Other Findings:  None. IMPRESSION: Limited evaluation of the LEFT peroneal vein, without evidence of deep venous thrombosis within the LEFT lower extremity. Electronically Signed   By: Suzen Dials M.D.   On: 01/18/2024 10:20   CT Angio Chest PE W and/or Wo Contrast Result Date: 01/17/2024 EXAM: CTA of the Chest with contrast for PE 01/17/2024 11:05:19 PM TECHNIQUE: CTA of the chest was performed after the administration of intravenous contrast. Multiplanar reformatted images are provided for review. MIP images are provided for review. Automated exposure control, iterative reconstruction, and/or weight based adjustment of the mA/kV was utilized to reduce the radiation dose to as low as reasonably achievable. COMPARISON: CT angiogram chest 02/10/2022. CLINICAL HISTORY: Pulmonary embolism (PE) suspected, high prob. pt states that she is here for low O2sat, nausea and vomiting and a headache. Recent knee surgery; Scan delayed due to IV issues. FINDINGS: PULMONARY ARTERIES:  Pulmonary arteries are adequately opacified for evaluation. No pulmonary embolism. Main pulmonary artery is normal in caliber. MEDIASTINUM: The heart and pericardium demonstrate no acute abnormality. There is no acute abnormality of the thoracic aorta. LYMPH NODES: No mediastinal, hilar or axillary lymphadenopathy. LUNGS AND PLEURA: There are mild patchy multifocal airspacing ground-glass opacities in the bilateral upper lobes and bilateral lower lobes, right greater than left. No pleural effusion or pneumothorax. UPPER ABDOMEN: Limited images of the upper abdomen are unremarkable. SOFT TISSUES AND BONES: No acute bone or soft tissue abnormality. IMPRESSION: 1. No pulmonary embolism. 2. Mild patchy multifocal ground-glass opacities in the bilateral upper and lower lobes,  right greater than left, compatible with an infectious or inflammatory process. Correlate clinically; consider short-interval chest CT follow-up if symptoms persist or worsen. Electronically signed by: Greig Pique MD 01/17/2024 11:19 PM EDT RP Workstation: [id number]   DG Chest Portable 1 View Result Date: 01/17/2024 CLINICAL DATA:  Hypoxia with nausea, vomiting and headache. EXAM: PORTABLE CHEST 1 VIEW COMPARISON:  April 12, 2021 FINDINGS: The cardiac silhouette is mildly enlarged and unchanged in size. Low lung volumes are noted with mild, stable elevation of the right hemidiaphragm. There is prominence of the central pulmonary vasculature. No acute infiltrate, pleural effusion or pneumothorax is identified. The visualized skeletal structures are unremarkable. IMPRESSION: Low lung volumes with mild cardiomegaly and mild pulmonary vascular congestion. Electronically Signed   By: Suzen Dials M.D.   On: 01/17/2024 18:17   DG Knee 1-2 Views Left Result Date: 01/14/2024 CLINICAL DATA:  Knee arthroscopy EXAM: DG KNEE 1-2V*L* COMPARISON:  12/21/2023 FLUOROSCOPY TIME:  Radiation Exposure Index (as provided by the fluoroscopic device): 22.08 If the device does not provide the exposure index: Fluoroscopy Time:  80 seconds Number of Acquired Images:  1 FINDINGS: Single spot film shows arthroscopy within the knee joint. IMPRESSION: Localization for knee arthroscopy Electronically Signed   By: Oneil Devonshire M.D.   On: 01/14/2024 21:59   DG C-Arm 1-60 Min-No Report Result Date: 01/14/2024 Fluoroscopy was utilized by the requesting physician.  No radiographic interpretation.   DG C-Arm 1-60 Min-No Report Result Date: 01/14/2024 Fluoroscopy was utilized by the requesting physician.  No radiographic interpretation.   DG C-Arm 1-60 Min-No Report Result Date: 01/14/2024 Fluoroscopy was utilized by the requesting physician.  No radiographic interpretation.    Microbiology: Results for orders placed or  performed during the hospital encounter of 01/17/24  Resp panel by RT-PCR (RSV, Flu A&B, Covid) Anterior Nasal Swab     Status: None   Collection Time: 01/17/24  5:14 PM   Specimen: Anterior Nasal Swab  Result Value Ref Range Status   SARS Coronavirus 2 by RT PCR NEGATIVE NEGATIVE Final    Comment: (NOTE) SARS-CoV-2 target nucleic acids are NOT DETECTED.  The SARS-CoV-2 RNA is generally detectable in upper respiratory specimens during the acute phase of infection. The lowest concentration of SARS-CoV-2 viral copies this assay can detect is 138 copies/mL. A negative result does not preclude SARS-Cov-2 infection and should not be used as the sole basis for treatment or other patient management decisions. A negative result may occur with  improper specimen collection/handling, submission of specimen other than nasopharyngeal swab, presence of viral mutation(s) within the areas targeted by this assay, and inadequate number of viral copies(<138 copies/mL). A negative result must be combined with clinical observations, patient history, and epidemiological information. The expected result is Negative.  Fact Sheet for Patients:  bloggercourse.com  Fact Sheet for Healthcare Providers:  seriousbroker.it  This test is no t yet approved or cleared by the [place]  FDA and  has been authorized for detection and/or diagnosis of SARS-CoV-2 by FDA under an Emergency Use Authorization (EUA). This EUA will remain  in effect (meaning this test can be used) for the duration of the COVID-19 declaration under Section 564(b)(1) of the Act, 21 U.S.C.section 360bbb-3(b)(1), unless the authorization is terminated  or revoked sooner.       Influenza A by PCR NEGATIVE NEGATIVE Final   Influenza B by PCR NEGATIVE NEGATIVE Final    Comment: (NOTE) The Xpert Xpress SARS-CoV-2/FLU/RSV plus assay is intended as an aid in the diagnosis of influenza from  Nasopharyngeal swab specimens and should not be used as a sole basis for treatment. Nasal washings and aspirates are unacceptable for Xpert Xpress SARS-CoV-2/FLU/RSV testing.  Fact Sheet for Patients: bloggercourse.com  Fact Sheet for Healthcare Providers: seriousbroker.it  This test is not yet approved or cleared by the [place]  FDA and has been authorized for detection and/or diagnosis of SARS-CoV-2 by FDA under an Emergency Use Authorization (EUA). This EUA will remain in effect (meaning this test can be used) for the duration of the COVID-19 declaration under Section 564(b)(1) of the Act, 21 U.S.C. section 360bbb-3(b)(1), unless the authorization is terminated or revoked.     Resp Syncytial Virus by PCR NEGATIVE NEGATIVE Final    Comment: (NOTE) Fact Sheet for Patients: bloggercourse.com  Fact Sheet for Healthcare Providers: seriousbroker.it  This test is not yet approved or cleared by the [place]  FDA and has been authorized for detection and/or diagnosis of SARS-CoV-2 by FDA under an Emergency Use Authorization (EUA). This EUA will remain in effect (meaning this test can be used) for the duration of the COVID-19 declaration under Section 564(b)(1) of the Act, 21 U.S.C. section 360bbb-3(b)(1), unless the authorization is terminated or revoked.  Performed at [facility], [address], [place], [place] [address]   Culture, blood (Routine X 2) w Reflex to ID Panel     Status: None   Collection Time: 01/18/24  5:12 AM   Specimen: BLOOD RIGHT HAND  Result Value Ref Range Status   Specimen Description BLOOD RIGHT HAND BLOOD  Final   Special Requests   Final    Blood Culture results may not be optimal due to an inadequate volume of blood received in culture bottles AEROBIC BOTTLE ONLY   Culture   Final    NO GROWTH 5 DAYS Performed at [facility], [address], [place], [place] [address]    Report Status 01/23/2024 FINAL  Final  Culture, blood (Routine X 2) w Reflex to ID Panel     Status: None   Collection Time: 01/18/24  5:21 AM   Specimen: BLOOD RIGHT FOREARM  Result Value Ref Range Status   Specimen Description BLOOD RIGHT FOREARM BLOOD  Final   Special Requests   Final    AEROBIC BOTTLE ONLY Blood Culture results may not be optimal due to an inadequate volume of blood received in culture bottles   Culture   Final    NO GROWTH 5 DAYS Performed at [facility], [address] [place], [place] [address]    Report Status 01/23/2024 FINAL  Final    Labs: CBC: Recent Labs  Lab 02/02/24 0251 02/04/24 0115  WBC 6.1 5.5  HGB 10.1* 10.8*  HCT 34.7* 36.1  MCV 90.8 88.3  PLT 392 382   Basic Metabolic Panel: Recent Labs  Lab 01/31/24 0205 02/01/24  0230 02/02/24 0251 02/03/24 0311 02/05/24 0350  NA 140  --  139 139 142  K 3.5  --  3.7 3.8 3.9  CL 100  --  98 100 100  CO2 31  --  32 31 31  GLUCOSE 107*  --  100* 85 112*  BUN <5*  --  <5* <5* 7  CREATININE 0.59  --  0.66 0.66 0.74  CALCIUM  7.8*  --  8.0* 8.4* 8.4*  MG 1.8 1.9 1.9  --  1.8   Liver Function Tests: No results for input(s): AST, ALT, ALKPHOS, BILITOT, PROT, ALBUMIN  in the last 168 hours. CBG: No results for input(s): GLUCAP in the last 168 hours.  Discharge time spent: 40 minutes.   Signed: Elgie Butter, MD [facility] 02/06/2024

## 2024-02-06 NOTE — Plan of Care (Signed)
  Problem: Education: Goal: Knowledge of General Education information will improve Description: Including pain rating scale, medication(s)/side effects and non-pharmacologic comfort measures 02/06/2024 1938 by Agapita Savarino K, RN Outcome: Adequate for Discharge 02/06/2024 1644 by Florian Dellis POUR, RN Outcome: Progressing   Problem: Health Behavior/Discharge Planning: Goal: Ability to manage health-related needs will improve 02/06/2024 1938 by Pria Klosinski K, RN Outcome: Adequate for Discharge 02/06/2024 1644 by Florian Dellis POUR, RN Outcome: Progressing   Problem: Clinical Measurements: Goal: Ability to maintain clinical measurements within normal limits will improve 02/06/2024 1938 by Grey Rakestraw K, RN Outcome: Adequate for Discharge 02/06/2024 1644 by Florian Dellis POUR, RN Outcome: Progressing Goal: Will remain free from infection 02/06/2024 1938 by Chitara Clonch K, RN Outcome: Adequate for Discharge 02/06/2024 1644 by Dorann Davidson K, RN Outcome: Progressing Goal: Diagnostic test results will improve 02/06/2024 1938 by Xavia Kniskern K, RN Outcome: Adequate for Discharge 02/06/2024 1644 by Florian Dellis POUR, RN Outcome: Progressing Goal: Respiratory complications will improve 02/06/2024 1938 by Faatimah Spielberg K, RN Outcome: Adequate for Discharge 02/06/2024 1644 by Corneilus Heggie K, RN Outcome: Progressing Goal: Cardiovascular complication will be avoided 02/06/2024 1938 by Tongela Encinas K, RN Outcome: Adequate for Discharge 02/06/2024 1644 by Vance Belcourt K, RN Outcome: Progressing   Problem: Activity: Goal: Risk for activity intolerance will decrease 02/06/2024 1938 by Irineo Gaulin K, RN Outcome: Adequate for Discharge 02/06/2024 1644 by Florian Dellis POUR, RN Outcome: Progressing   Problem: Nutrition: Goal: Adequate nutrition will be maintained 02/06/2024 1938 by Shavawn Stobaugh K, RN Outcome: Adequate for Discharge 02/06/2024 1644 by Florian Dellis POUR, RN Outcome: Progressing   Problem: Coping: Goal: Level of anxiety will decrease 02/06/2024 1938 by Kalev Temme K, RN Outcome: Adequate for Discharge 02/06/2024 1644 by Florian Dellis POUR, RN Outcome: Progressing   Problem: Elimination: Goal: Will not experience complications related to bowel motility 02/06/2024 1938 by Gwynevere Lizana K, RN Outcome: Adequate for Discharge 02/06/2024 1644 by Florian Dellis POUR, RN Outcome: Progressing Goal: Will not experience complications related to urinary retention 02/06/2024 1938 by Deryk Bozman K, RN Outcome: Adequate for Discharge 02/06/2024 1644 by Florian Dellis POUR, RN Outcome: Progressing   Problem: Pain Managment: Goal: General experience of comfort will improve and/or be controlled 02/06/2024 1938 by Darly Massi K, RN Outcome: Adequate for Discharge 02/06/2024 1644 by Florian Dellis POUR, RN Outcome: Progressing   Problem: Safety: Goal: Ability to remain free from injury will improve 02/06/2024 1938 by Kailer Heindel K, RN Outcome: Adequate for Discharge 02/06/2024 1644 by Florian Dellis POUR, RN Outcome: Progressing   Problem: Skin Integrity: Goal: Risk for impaired skin integrity will decrease 02/06/2024 1938 by Isa Kohlenberg K, RN Outcome: Adequate for Discharge 02/06/2024 1644 by Peng Thorstenson K, RN Outcome: Progressing

## 2024-02-06 NOTE — Progress Notes (Signed)
 Packed up all patients belongings. Patient discharged via [facility] to [facility].  Called report to Powell Rigg, LPN.

## 2024-02-10 ENCOUNTER — Ambulatory Visit: Admitting: Internal Medicine

## 2024-02-10 ENCOUNTER — Encounter (HOSPITAL_BASED_OUTPATIENT_CLINIC_OR_DEPARTMENT_OTHER): Payer: Self-pay | Admitting: Physical Therapy

## 2024-02-17 ENCOUNTER — Encounter (HOSPITAL_BASED_OUTPATIENT_CLINIC_OR_DEPARTMENT_OTHER): Payer: Self-pay | Admitting: Physical Therapy

## 2024-02-18 ENCOUNTER — Ambulatory Visit (INDEPENDENT_AMBULATORY_CARE_PROVIDER_SITE_OTHER): Admitting: Student

## 2024-02-18 DIAGNOSIS — M23612 Other spontaneous disruption of anterior cruciate ligament of left knee: Secondary | ICD-10-CM

## 2024-02-18 NOTE — Progress Notes (Signed)
 Post Operative Evaluation    Procedure/Date of Surgery: Right knee ACL reconstruction, PCL reconstruction, posterior lateral corner repair, MPFL reconstruction  Interval History:   Patient presents today 5 weeks status post the above procedure.  Patient was unfortunately admitted to the hospital from 11/6 to 11/13 for cellulitis of the left knee requiring IV antibiotics.  She states that she has now completed all courses of antibiotics and is overall doing some better.  Denies any consistent pain within the knee however this does typically increase after PT sessions.  Recently was able to progress into partial weightbearing with PT.  She takes oxycodone  15 mg only occasionally.   PMH/PSH/Family History/Social History/Meds/Allergies:    Past Medical History:  Diagnosis Date   Anxiety    Class 3 obesity (HCC) 12/09/2020   Depression    GERD (gastroesophageal reflux disease)    Guillain Barr syndrome    Nonalcoholic steatohepatitis (NASH) 12/09/2020   Obesity    PUD (peptic ulcer disease) 12/09/2020   Past Surgical History:  Procedure Laterality Date   BIOPSY  11/19/2020   Procedure: BIOPSY;  Surgeon: Kristie Lamprey, MD;  Location: [facility];  Service: Endoscopy;;   ESOPHAGOGASTRODUODENOSCOPY N/A 01/10/2021   normal   ESOPHAGOGASTRODUODENOSCOPY (EGD) WITH PROPOFOL  N/A 11/19/2020   LA Grade B reflux esophagitis, one small non-bleeding gastric ulcer in antrum s/p biopsy. Negative h.pylori.   FLEXIBLE SIGMOIDOSCOPY N/A 01/10/2021   Colonoscopy attempted Oct 2022 by Dr. Elicia but prep was poor. Stool in rectum and rectosigmoid colon.   MEDIAL PATELLOFEMORAL LIGAMENT REPAIR Left 01/14/2024   Procedure: RECONSTRUCTION, LIGAMENT, MEDIAL PATELLOFEMORAL;  Surgeon: Genelle Standing, MD;  Location: [facility] OR;  Service: Orthopedics;  Laterality: Left;   Social History   Socioeconomic History   Marital status: Single    Spouse name: Not on file   Number  of children: Not on file   Years of education: Not on file   Highest education level: Not on file  Occupational History   Not on file  Tobacco Use   Smoking status: Never    Passive exposure: Never   Smokeless tobacco: Never  Vaping Use   Vaping status: Never Used  Substance and Sexual Activity   Alcohol use: Not Currently   Drug use: No   Sexual activity: Not Currently  Other Topics Concern   Not on file  Social History Narrative   Not on file   Social Drivers of Health   Financial Resource Strain: Not on file  Food Insecurity: No Food Insecurity (01/30/2024)   Hunger Vital Sign    Worried About Running Out of Food in the Last Year: Never true    Ran Out of Food in the Last Year: Never true  Transportation Needs: No Transportation Needs (01/31/2024)   PRAPARE - Administrator, Civil Service (Medical): No    Lack of Transportation (Non-Medical): No  Physical Activity: Not on file  Stress: Not on file  Social Connections: Not on file   Family History  Problem Relation Age of Onset   Hypertension Other    Colon cancer Maternal Aunt    Colon polyps Neg Hx    Allergies  Allergen Reactions   Azithromycin  Shortness Of Breath and Nausea And Vomiting   Current Outpatient Medications  Medication Sig Dispense Refill   acetaminophen  (TYLENOL ) 325  MG tablet Take 2 tablets (650 mg total) by mouth every 6 (six) hours as needed for mild pain (or Fever >/= 101).     ascorbic acid  (VITAMIN C ) 500 MG tablet Take 500 mg by mouth daily.     buPROPion  (WELLBUTRIN  XL) 300 MG 24 hr tablet Take 300 mg by mouth daily.     busPIRone  (BUSPAR ) 10 MG tablet Take 20 mg by mouth 3 (three) times daily.     cholecalciferol  (VITAMIN D3) 25 MCG (1000 UNIT) tablet Take 2,000 Units by mouth in the morning and at bedtime.     cyanocobalamin  (,VITAMIN B-12,) 1000 MCG/ML injection Inject 1 mL (1,000 mcg total) into the muscle every 30 (thirty) days. 1 mL 0   doxepin  (SINEQUAN ) 25 MG capsule  Take 25 mg by mouth at bedtime.     fluticasone  (FLONASE ) 50 MCG/ACT nasal spray Place 1 spray into both nostrils daily.     folic acid  (FOLVITE ) 1 MG tablet Take 1 tablet (1 mg total) by mouth daily.     hydrOXYzine  (ATARAX ) 25 MG tablet Take 25 mg by mouth at bedtime.     ipratropium-albuterol  (DUONEB) 0.5-2.5 (3) MG/3ML SOLN Take 3 mLs by nebulization 3 (three) times daily.     linaclotide  (LINZESS ) 290 MCG CAPS capsule Take 1 capsule (290 mcg total) by mouth daily before breakfast. 30 capsule    loperamide  (IMODIUM ) 2 MG capsule Take 2 mg by mouth every 6 (six) hours as needed for diarrhea or loose stools.     Melatonin 5 MG CHEW Chew 10 mg by mouth at bedtime. [place] Dreamy Sleep Gummies 20 tablet 0   methocarbamol  (ROBAXIN ) 500 MG tablet Take 1 tablet (500 mg total) by mouth every 6 (six) hours as needed for muscle spasms. 20 tablet 0   metoprolol  tartrate (LOPRESSOR ) 25 MG tablet Take 0.5 tablets (12.5 mg total) by mouth 2 (two) times daily as needed (HR>120). 60 tablet 0   Multiple Vitamin (MULTIVITAMINS PO) Take 1 tablet by mouth daily.     naloxone  (NARCAN ) nasal spray 4 mg/0.1 mL Place 1 spray into the nose once as needed (opioid overdose).     omeprazole (PRILOSEC) 20 MG capsule Take 20 mg by mouth 2 (two) times daily before a meal.     ondansetron  (ZOFRAN ) 4 MG tablet Take 4 mg by mouth every 8 (eight) hours as needed for nausea.     Oxycodone  HCl 10 MG TABS Take 1 tablet (10 mg total) by mouth every 6 (six) hours as needed (pain). 10 tablet 0   polyethylene glycol (MIRALAX  / GLYCOLAX ) 17 g packet Take 17 g by mouth 2 (two) times daily.     pregabalin  (LYRICA ) 100 MG capsule Take 100 mg by mouth 3 (three) times daily. 0000, 0800, 1600     pregabalin  (LYRICA ) 25 MG capsule Take 25 mg by mouth 3 (three) times daily. 0000, 0800, 1600     rivaroxaban  (XARELTO ) 10 MG TABS tablet Take 1 tablet (10 mg total) by mouth daily for 14 days. 14 tablet 0   saccharomyces boulardii (FLORASTOR) 250 MG  capsule Take 1 capsule (250 mg total) by mouth 2 (two) times daily. 30 capsule 0   senna-docusate (SENOKOT-S) 8.6-50 MG tablet Take 1 tablet by mouth at bedtime.     Venlafaxine  HCl 225 MG TB24 Take 1 tablet by mouth daily.     No current facility-administered medications for this visit.   No results found.  Review of Systems:   A ROS  was performed including pertinent positives and negatives as documented in the HPI.   Musculoskeletal Exam:    There were no vitals taken for this visit.  Left knee incisions are well-appearing without evidence of erythema or drainage.  No significant warmth upon palpation.  Minimal tenderness over the patella and bilateral joint lines.  Distal neurosensory exam intact.  Imaging:     Assessment:   Patient is 5 weeks status post left knee multiligamentous knee reconstruction presenting today for follow-up.  She has unfortunately sustained another hospitalization due to cellulitis which has been remedied with IV antibiotics.  No concern for current infection on exam today however will have patient continue to monitor.  She is currently in a rehab facility and is working with physical therapy who are starting to progress with partial weightbearing and she has tolerated this well.  Patient will continue work with PT for further progression of weightbearing and strengthening and we will plan to see her back in 5 weeks.  Plan :    - Return to clinic 5 weeks for reassessment      I personally saw and evaluated the patient, and participated in the management and treatment plan.   Leonce Reveal, PA-C Orthopedics  This document was dictated using Conservation officer, historic buildings. A reasonable attempt at proof reading has been made to minimize errors.

## 2024-02-19 ENCOUNTER — Encounter (HOSPITAL_BASED_OUTPATIENT_CLINIC_OR_DEPARTMENT_OTHER): Payer: Self-pay

## 2024-02-24 ENCOUNTER — Encounter (HOSPITAL_BASED_OUTPATIENT_CLINIC_OR_DEPARTMENT_OTHER): Payer: Self-pay

## 2024-02-26 ENCOUNTER — Encounter (HOSPITAL_BASED_OUTPATIENT_CLINIC_OR_DEPARTMENT_OTHER): Payer: Self-pay | Admitting: Physical Therapy

## 2024-03-06 ENCOUNTER — Encounter (HOSPITAL_COMMUNITY): Payer: Self-pay | Admitting: Orthopaedic Surgery

## 2024-03-25 NOTE — Progress Notes (Unsigned)
 "  Initial neurology clinic note  Reason for Evaluation: Consultation requested by Genelle Standing, MD for an opinion regarding ***. My final recommendations will be communicated back to the requesting physician by way of shared medical record or letter to requesting physician via [place]  mail.  HPI: This is [name], a 33 y.o. ***-handed female with a medical history of left knee dislocation, NASH, PUD, depression, and obesity*** who presents to neurology clinic with the chief complaint of ***. The patient is accompanied by ***.  *** Sees Dr. Genelle in ortho for dislocation of left knee (since 2022) Dr. 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 Saw Dr. Onita in past but never got EMG as recommended Now walking with walker Had re-dislocation Sent to us  from ortho for clearance from GBS perspective for surgery - already had surgery???   Per Dr. Georgianne note from 03/12/23: I was able to review neurohospitalist evaluation, initially by Dr. Michaela on December 20, 2020, patient was admitted left knee dislocation in August 2022 following a fall accident, then  complains of numbness tingling started since September 2022, was found to have sensory loss, areflexia, profound gait abnormality distal weakness MRI of the brain, cervical spine from December 20, 2020 was normal   Spinal fluid testing from December 21, 2020, showed elevated total protein of 57, glucose of 78, WBC of 1,   Extensive laboratory evaluations showed normal cortisone, B6, copper, vitamin E, B12 deficiency 153, folic acid  was decreased 3.2, slight elevation in free T4, mild elevation of TSH 5.2,   Treated with IVIG 2 g/kg x 5 days since December 21, 2020, repeat again in January 2023   She was started on prednisone  tapering dose, was not sure about the benefit, now on 10 mg daily   She denies significant improvement with IVIG treatment, was discharged to nursing home at [facility], her recovery is  hindered by her weight issue, left knee dislocation knee pain, also suffered left foot fracture   She now able to take few steps using walker, continue have numbness tingling in her lower extremity, denies significant upper extremity weakness, or sensory loss, denies bowel and bladder incontinence   On Lyrica  75 mg TID Effexor  37.5 mg daily Oxy Remeron  Wellbutrin   The patient has not*** had similar episodes of symptoms in the past. ***  Muscle bulk loss? *** Muscle pain? ***  Cramps/Twitching? *** Suggestion of myotonia/difficulty relaxing after contraction? ***  Fatigable weakness?*** Does strength improve after brief exercise?***  Able to brush hair/teeth without difficulty? *** Able to button shirts/use zips? *** Clumsiness/dropping grasped objects?*** Can you arise from squatted position easily? *** Able to get out of chair without using arms? *** Able to walk up steps easily? *** Use an assistive device to walk? *** Significant imbalance with walking? *** Falls?*** Any change in urine color, especially after exertion/physical activity? ***  The patient denies*** symptoms suggestive of oculobulbar weakness including diplopia, ptosis, dysphagia, poor saliva control, dysarthria/dysphonia, impaired mastication, facial weakness/droop.  There are no*** neuromuscular respiratory weakness symptoms, particularly orthopnea>dyspnea.   Pseudobulbar affect is absent***.  The patient does not*** report symptoms referable to autonomic dysfunction including impaired sweating, heat or cold intolerance, excessive mucosal dryness, gastroparetic early satiety, postprandial abdominal bloating, constipation, bowel or bladder dyscontrol, erectile dysfunction*** or syncope/presyncope/orthostatic intolerance.  There are no*** complaints relating to other symptoms of small fiber modalities including paresthesia/pain.  The patient has not *** noticed any recent skin rashes nor does he*** report any  constitutional symptoms  like fever, night sweats, anorexia or unintentional weight loss.  EtOH use: ***  Restrictive diet? *** Family history of neuropathy/myopathy/NM disease?***  Previous labs, electrodiagnostics, and neuroimaging are summarized below, but pertinent findings include***  Any biopsy done? *** Current medications being tried for the patient's symptoms include ***  Prior medications that have been tried: ***   MEDICATIONS:  Outpatient Encounter Medications as of 04/08/2024  Medication Sig Note   acetaminophen  (TYLENOL ) 325 MG tablet Take 2 tablets (650 mg total) by mouth every 6 (six) hours as needed for mild pain (or Fever >/= 101).    ascorbic acid  (VITAMIN C ) 500 MG tablet Take 500 mg by mouth daily.    buPROPion  (WELLBUTRIN  XL) 300 MG 24 hr tablet Take 300 mg by mouth daily.    busPIRone  (BUSPAR ) 10 MG tablet Take 20 mg by mouth 3 (three) times daily.    cholecalciferol  (VITAMIN D3) 25 MCG (1000 UNIT) tablet Take 2,000 Units by mouth in the morning and at bedtime.    cyanocobalamin  (,VITAMIN B-12,) 1000 MCG/ML injection Inject 1 mL (1,000 mcg total) into the muscle every 30 (thirty) days. 01/29/2024: Next dose is due on 02-20-24   doxepin  (SINEQUAN ) 25 MG capsule Take 25 mg by mouth at bedtime.    fluticasone  (FLONASE ) 50 MCG/ACT nasal spray Place 1 spray into both nostrils daily.    folic acid  (FOLVITE ) 1 MG tablet Take 1 tablet (1 mg total) by mouth daily.    hydrOXYzine  (ATARAX ) 25 MG tablet Take 25 mg by mouth at bedtime.    ipratropium-albuterol  (DUONEB) 0.5-2.5 (3) MG/3ML SOLN Take 3 mLs by nebulization 3 (three) times daily.    linaclotide  (LINZESS ) 290 MCG CAPS capsule Take 1 capsule (290 mcg total) by mouth daily before breakfast.    loperamide  (IMODIUM ) 2 MG capsule Take 2 mg by mouth every 6 (six) hours as needed for diarrhea or loose stools.    Melatonin 5 MG CHEW Chew 10 mg by mouth at bedtime. Goli Dreamy Sleep Gummies    methocarbamol  (ROBAXIN ) 500 MG  tablet Take 1 tablet (500 mg total) by mouth every 6 (six) hours as needed for muscle spasms.    metoprolol  tartrate (LOPRESSOR ) 25 MG tablet Take 0.5 tablets (12.5 mg total) by mouth 2 (two) times daily as needed (HR>120).    Multiple Vitamin (MULTIVITAMINS PO) Take 1 tablet by mouth daily.    naloxone  (NARCAN ) nasal spray 4 mg/0.1 mL Place 1 spray into the nose once as needed (opioid overdose).    omeprazole (PRILOSEC) 20 MG capsule Take 20 mg by mouth 2 (two) times daily before a meal.    ondansetron  (ZOFRAN ) 4 MG tablet Take 4 mg by mouth every 8 (eight) hours as needed for nausea.    Oxycodone  HCl 10 MG TABS Take 1 tablet (10 mg total) by mouth every 6 (six) hours as needed (pain).    polyethylene glycol (MIRALAX  / GLYCOLAX ) 17 g packet Take 17 g by mouth 2 (two) times daily.    pregabalin  (LYRICA ) 100 MG capsule Take 100 mg by mouth 3 (three) times daily. 0000, 0800, 1600 01/29/2024: Already has two doses today @0000  and 0800   pregabalin  (LYRICA ) 25 MG capsule Take 25 mg by mouth 3 (three) times daily. 0000, 0800, 1600 01/29/2024: Already has two doses today @0000  and 0800    rivaroxaban  (XARELTO ) 10 MG TABS tablet Take 1 tablet (10 mg total) by mouth daily for 14 days.    saccharomyces boulardii (FLORASTOR) 250 MG capsule Take  1 capsule (250 mg total) by mouth 2 (two) times daily.    senna-docusate (SENOKOT-S) 8.6-50 MG tablet Take 1 tablet by mouth at bedtime.    Venlafaxine  HCl 225 MG TB24 Take 1 tablet by mouth daily.    No facility-administered encounter medications on file as of 04/08/2024.    PAST MEDICAL HISTORY: Past Medical History:  Diagnosis Date   Anxiety    Class 3 obesity (HCC) 12/09/2020   Depression    GERD (gastroesophageal reflux disease)    Guillain Barr syndrome    Nonalcoholic steatohepatitis (NASH) 12/09/2020   Obesity    PUD (peptic ulcer disease) 12/09/2020    PAST SURGICAL HISTORY: Past Surgical History:  Procedure Laterality Date   BIOPSY  11/19/2020    Procedure: BIOPSY;  Surgeon: Kristie Lamprey, MD;  Location: [facility];  Service: Endoscopy;;   ESOPHAGOGASTRODUODENOSCOPY N/A 01/10/2021   normal   ESOPHAGOGASTRODUODENOSCOPY (EGD) WITH PROPOFOL  N/A 11/19/2020   LA Grade B reflux esophagitis, one small non-bleeding gastric ulcer in antrum s/p biopsy. Negative h.pylori.   FLEXIBLE SIGMOIDOSCOPY N/A 01/10/2021   Colonoscopy attempted Oct 2022 by Dr. Elicia but prep was poor. Stool in rectum and rectosigmoid colon.   MEDIAL PATELLOFEMORAL LIGAMENT REPAIR Left 01/14/2024   Procedure: RECONSTRUCTION, LIGAMENT, MEDIAL PATELLOFEMORAL;  Surgeon: Genelle Standing, MD;  Location: [facility];  Service: Orthopedics;  Laterality: Left;    ALLERGIES: Allergies[1]  FAMILY HISTORY: Family History  Problem Relation Age of Onset   Hypertension Other    Colon cancer Maternal Aunt    Colon polyps Neg Hx     SOCIAL HISTORY: Social History[2] Social History   Social History Narrative   Not on file     OBJECTIVE: PHYSICAL EXAM: There were no vitals taken for this visit.  General:*** General appearance: Awake and alert. No distress. Cooperative with exam.  Skin: No obvious rash or jaundice. HEENT: Atraumatic. Anicteric. Lungs: Non-labored breathing on room air  Heart: Regular Abdomen: Soft, non tender. Extremities: No edema. No obvious deformity.  Musculoskeletal: No obvious joint swelling. Psych: Affect appropriate.  Neurological: Mental Status: Alert. Speech fluent. No pseudobulbar affect Cranial Nerves: CNII: No RAPD. Visual fields grossly intact. CNIII, IV, VI: PERRL. No nystagmus. EOMI. CN V: Facial sensation intact bilaterally to fine touch. [place] clench strong. Jaw jerk***. CN VII: Facial muscles symmetric and strong. No ptosis at rest or after sustained upgaze***. CN VIII: Hearing grossly intact bilaterally. CN IX: No hypophonia. CN X: Palate elevates symmetrically. CN XI: Full strength shoulder shrug bilaterally. CN  XII: Tongue protrusion full and midline. No atrophy or fasciculations. No significant dysarthria*** Motor: Tone is ***. *** fasciculations in *** extremities. *** atrophy. No grip or percussive myotonia.***  Individual muscle group testing (MRC grade out of 5):  Movement     Neck flexion ***    Neck extension ***     Right Left   Shoulder abduction *** ***   Shoulder adduction *** ***   Shoulder ext rotation *** ***   Shoulder int rotation *** ***   Elbow flexion *** ***   Elbow extension *** ***   Wrist extension *** ***   Wrist flexion *** ***   Finger abduction - FDI *** ***   Finger abduction - ADM *** ***   Finger extension *** ***   Finger distal flexion - 2/3 *** ***   Finger distal flexion - 4/5 *** ***   Thumb flexion - FPL *** ***   Thumb abduction - APB *** ***  Hip flexion *** ***   Hip extension *** ***   Hip adduction *** ***   Hip abduction *** ***   Knee extension *** ***   Knee flexion *** ***   Dorsiflexion *** ***   Plantarflexion *** ***   Inversion *** ***   Eversion *** ***   Great toe extension *** ***   Great toe flexion *** ***     Reflexes:  Right Left   Bicep *** ***   Tricep *** ***   BrRad *** ***   Knee *** ***   Ankle *** ***    Pathological Reflexes: Babinski: *** response bilaterally*** Hoffman: *** Troemner: *** Pectoral: *** Palmomental: *** Facial: *** Midline tap: *** Sensation: Pinprick: *** Vibration: *** Temperature: *** Proprioception: *** Coordination: Intact finger-to- nose-finger bilaterally. Romberg negative.*** Gait: Able to rise from chair with arms crossed unassisted. Normal, narrow-based gait. Able to tandem walk. Able to walk on toes and heels.***  Lab and Test Review: Internal labs: 02/05/24: BMP significant for glucose 112 CBC significant for Hb 10.8   04/24/21: AChR abs (binding, blocking, modulating): negative   Lumbar puncture (12/21/20): Opening pressure 20 cm water RBC 500, WBC 1,  protein 57, glucose 78 Gram stain negative   B12 (12/16/20): 153 Folate (12/16/20): low at 3.2 B1 wnl   External labs: CMP (06/19/23) significant for alk phos 193, AST 71, ALT 37   Imaging/Procedures: MRI cervical spine wo contrast (12/20/20): Normal MRI of the cervical spine.    MRI brain wo contrast (12/20/20): Normal brain MRI.    MRI brain w/wo contrast (04/21/21): IMPRESSION: No evidence of recent infarction, hemorrhage, or mass. No abnormal enhancement.   Prominence of the palatine tonsils.   MRI cervical spine w/wo contrast (04/24/21): Normal examination    MRI left knee (09/20/23): IMPRESSION: Lateral subluxation of the patella. No contusion or definitive retinacular injury. Correlate for instability.   Fluid surrounding the superior and inferior surface of the lateral meniscus consistent with avulsion/floating meniscus.   Small tear to the posterior horn of the medial meniscus involving the undersurface.   Moderate joint effusion. ***  ASSESSMENT: Carla Little is a 33 y.o. female who presents for evaluation of ***. *** has a relevant medical history of ***. *** neurological examination is pertinent for ***. Available diagnostic data is significant for ***. This constellation of symptoms and objective data would most likely localize to ***. ***  PLAN: -Blood work: *** ***  -Return to clinic ***  The impression above as well as the plan as outlined below were extensively discussed with the patient (in the company of ***) who voiced understanding. All questions were answered to their satisfaction.  The patient was counseled on pertinent fall precautions per the printed material provided today, and as noted under the Patient Instructions section below.***  When available, results of the above investigations and possible further recommendations will be communicated to the patient via telephone/MyChart. Patient to call office if not contacted after expected  testing turnaround time.   Total time spent reviewing records, interview, history/exam, documentation, and coordination of care on day of encounter:  *** min   Thank you for allowing me to participate in patient's care.  If I can answer any additional questions, I would be pleased to do so.  Venetia Potters, MD   CC: Patient, No Pcp Per No address on file  CC: Referring provider: Genelle Standing, MD [address] [place],  [place] [address]    [1]  Allergies  Allergen Reactions   Azithromycin  Shortness Of Breath and Nausea And Vomiting  [2]  Social History Tobacco Use   Smoking status: Never    Passive exposure: Never   Smokeless tobacco: Never  Vaping Use   Vaping status: Never Used  Substance Use Topics   Alcohol use: Not Currently   Drug use: No   "

## 2024-04-01 ENCOUNTER — Ambulatory Visit (HOSPITAL_BASED_OUTPATIENT_CLINIC_OR_DEPARTMENT_OTHER): Admitting: Orthopaedic Surgery

## 2024-04-02 ENCOUNTER — Ambulatory Visit: Admitting: Cardiology

## 2024-04-02 NOTE — Progress Notes (Unsigned)
 "     Clinical Summary Carla Little is a 34 y.o.female    1.Preoperative evaluation    2. Tachycardia    3. OSA - from notes untreated    Past Medical History:  Diagnosis Date   Anxiety    Class 3 obesity (HCC) 12/09/2020   Depression    GERD (gastroesophageal reflux disease)    Guillain Barr syndrome    Nonalcoholic steatohepatitis (NASH) 12/09/2020   Obesity    PUD (peptic ulcer disease) 12/09/2020     Allergies[1]   Current Outpatient Medications  Medication Sig Dispense Refill   acetaminophen  (TYLENOL ) 325 MG tablet Take 2 tablets (650 mg total) by mouth every 6 (six) hours as needed for mild pain (or Fever >/= 101).     ascorbic acid  (VITAMIN C ) 500 MG tablet Take 500 mg by mouth daily.     buPROPion  (WELLBUTRIN  XL) 300 MG 24 hr tablet Take 300 mg by mouth daily.     busPIRone  (BUSPAR ) 10 MG tablet Take 20 mg by mouth 3 (three) times daily.     cholecalciferol  (VITAMIN D3) 25 MCG (1000 UNIT) tablet Take 2,000 Units by mouth in the morning and at bedtime.     cyanocobalamin  (,VITAMIN B-12,) 1000 MCG/ML injection Inject 1 mL (1,000 mcg total) into the muscle every 30 (thirty) days. 1 mL 0   doxepin  (SINEQUAN ) 25 MG capsule Take 25 mg by mouth at bedtime.     fluticasone  (FLONASE ) 50 MCG/ACT nasal spray Place 1 spray into both nostrils daily.     folic acid  (FOLVITE ) 1 MG tablet Take 1 tablet (1 mg total) by mouth daily.     hydrOXYzine  (ATARAX ) 25 MG tablet Take 25 mg by mouth at bedtime.     ipratropium-albuterol  (DUONEB) 0.5-2.5 (3) MG/3ML SOLN Take 3 mLs by nebulization 3 (three) times daily.     linaclotide  (LINZESS ) 290 MCG CAPS capsule Take 1 capsule (290 mcg total) by mouth daily before breakfast. 30 capsule    loperamide  (IMODIUM ) 2 MG capsule Take 2 mg by mouth every 6 (six) hours as needed for diarrhea or loose stools.     Melatonin 5 MG CHEW Chew 10 mg by mouth at bedtime. Goli Dreamy Sleep Gummies 20 tablet 0   methocarbamol  (ROBAXIN ) 500 MG tablet  Take 1 tablet (500 mg total) by mouth every 6 (six) hours as needed for muscle spasms. 20 tablet 0   metoprolol  tartrate (LOPRESSOR ) 25 MG tablet Take 0.5 tablets (12.5 mg total) by mouth 2 (two) times daily as needed (HR>120). 60 tablet 0   Multiple Vitamin (MULTIVITAMINS PO) Take 1 tablet by mouth daily.     naloxone  (NARCAN ) nasal spray 4 mg/0.1 mL Place 1 spray into the nose once as needed (opioid overdose).     omeprazole (PRILOSEC) 20 MG capsule Take 20 mg by mouth 2 (two) times daily before a meal.     ondansetron  (ZOFRAN ) 4 MG tablet Take 4 mg by mouth every 8 (eight) hours as needed for nausea.     Oxycodone  HCl 10 MG TABS Take 1 tablet (10 mg total) by mouth every 6 (six) hours as needed (pain). 10 tablet 0   polyethylene glycol (MIRALAX  / GLYCOLAX ) 17 g packet Take 17 g by mouth 2 (two) times daily.     pregabalin  (LYRICA ) 100 MG capsule Take 100 mg by mouth 3 (three) times daily. 0000, 0800, 1600     pregabalin  (LYRICA ) 25 MG capsule Take 25 mg by mouth 3 (three) times  daily. 0000, 0800, 1600     rivaroxaban  (XARELTO ) 10 MG TABS tablet Take 1 tablet (10 mg total) by mouth daily for 14 days. 14 tablet 0   saccharomyces boulardii (FLORASTOR) 250 MG capsule Take 1 capsule (250 mg total) by mouth 2 (two) times daily. 30 capsule 0   senna-docusate (SENOKOT-S) 8.6-50 MG tablet Take 1 tablet by mouth at bedtime.     Venlafaxine  HCl 225 MG TB24 Take 1 tablet by mouth daily.     No current facility-administered medications for this visit.     Past Surgical History:  Procedure Laterality Date   BIOPSY  11/19/2020   Procedure: BIOPSY;  Surgeon: Kristie Lamprey, MD;  Location: [facility];  Service: Endoscopy;;   ESOPHAGOGASTRODUODENOSCOPY N/A 01/10/2021   normal   ESOPHAGOGASTRODUODENOSCOPY (EGD) WITH PROPOFOL  N/A 11/19/2020   LA Grade B reflux esophagitis, one small non-bleeding gastric ulcer in antrum s/p biopsy. Negative h.pylori.   FLEXIBLE SIGMOIDOSCOPY N/A 01/10/2021   Colonoscopy  attempted Oct 2022 by Dr. Elicia but prep was poor. Stool in rectum and rectosigmoid colon.   MEDIAL PATELLOFEMORAL LIGAMENT REPAIR Left 01/14/2024   Procedure: RECONSTRUCTION, LIGAMENT, MEDIAL PATELLOFEMORAL;  Surgeon: Genelle Standing, MD;  Location: [facility];  Service: Orthopedics;  Laterality: Left;     Allergies[2]    Family History  Problem Relation Age of Onset   Hypertension Other    Colon cancer Maternal Aunt    Colon polyps Neg Hx      Social History Carla Little reports that she has never smoked. She has never been exposed to tobacco smoke. She has never used smokeless tobacco. Carla Little reports that she does not currently use alcohol.   Review of Systems CONSTITUTIONAL: No weight loss, fever, chills, weakness or fatigue.  HEENT: Eyes: No visual loss, blurred vision, double vision or yellow sclerae.No hearing loss, sneezing, congestion, runny nose or sore throat.  SKIN: No rash or itching.  CARDIOVASCULAR:  RESPIRATORY: No shortness of breath, cough or sputum.  GASTROINTESTINAL: No anorexia, nausea, vomiting or diarrhea. No abdominal pain or blood.  GENITOURINARY: No burning on urination, no polyuria NEUROLOGICAL: No headache, dizziness, syncope, paralysis, ataxia, numbness or tingling in the extremities. No change in bowel or bladder control.  MUSCULOSKELETAL: No muscle, back pain, joint pain or stiffness.  LYMPHATICS: No enlarged nodes. No history of splenectomy.  PSYCHIATRIC: No history of depression or anxiety.  ENDOCRINOLOGIC: No reports of sweating, cold or heat intolerance. No polyuria or polydipsia.  Carla Little   Physical Examination There were no vitals filed for this visit. There were no vitals filed for this visit.  Gen: resting comfortably, no acute distress HEENT: no scleral icterus, pupils equal round and reactive, no palptable cervical adenopathy,  CV Resp: Clear to auscultation bilaterally GI: abdomen is soft, non-tender, non-distended, normal bowel  sounds, no hepatosplenomegaly MSK: extremities are warm, no edema.  Skin: warm, no rash Neuro:  no focal deficits Psych: appropriate affect   Diagnostic Studies     Assessment and Plan        Dorn PHEBE Ross, M.D., F.A.C.C.    [1]  Allergies Allergen Reactions   Azithromycin  Shortness Of Breath and Nausea And Vomiting  [2]  Allergies Allergen Reactions   Azithromycin  Shortness Of Breath and Nausea And Vomiting   "

## 2024-04-04 ENCOUNTER — Other Ambulatory Visit: Payer: Self-pay

## 2024-04-04 ENCOUNTER — Encounter (HOSPITAL_COMMUNITY): Payer: Self-pay | Admitting: Emergency Medicine

## 2024-04-04 ENCOUNTER — Emergency Department (HOSPITAL_COMMUNITY)

## 2024-04-04 ENCOUNTER — Emergency Department (HOSPITAL_COMMUNITY)
Admission: EM | Admit: 2024-04-04 | Discharge: 2024-04-04 | Disposition: A | Discharge status: Skilled Nursing Facility | Attending: Emergency Medicine | Admitting: Emergency Medicine

## 2024-04-04 DIAGNOSIS — R112 Nausea with vomiting, unspecified: Secondary | ICD-10-CM | POA: Diagnosis not present

## 2024-04-04 DIAGNOSIS — W06XXXA Fall from bed, initial encounter: Secondary | ICD-10-CM | POA: Diagnosis not present

## 2024-04-04 DIAGNOSIS — Z7901 Long term (current) use of anticoagulants: Secondary | ICD-10-CM | POA: Diagnosis not present

## 2024-04-04 DIAGNOSIS — W19XXXA Unspecified fall, initial encounter: Secondary | ICD-10-CM

## 2024-04-04 DIAGNOSIS — R748 Abnormal levels of other serum enzymes: Secondary | ICD-10-CM | POA: Diagnosis not present

## 2024-04-04 DIAGNOSIS — M25562 Pain in left knee: Secondary | ICD-10-CM | POA: Insufficient documentation

## 2024-04-04 LAB — CBC WITH DIFFERENTIAL/PLATELET
Abs Immature Granulocytes: 0.05 K/uL (ref 0.00–0.07)
Basophils Absolute: 0 K/uL (ref 0.0–0.1)
Basophils Relative: 0 %
Eosinophils Absolute: 0 K/uL (ref 0.0–0.5)
Eosinophils Relative: 0 %
HCT: 46.7 % — ABNORMAL HIGH (ref 36.0–46.0)
Hemoglobin: 13.3 g/dL (ref 12.0–15.0)
Immature Granulocytes: 0 %
Lymphocytes Relative: 17 %
Lymphs Abs: 2 K/uL (ref 0.7–4.0)
MCH: 25.1 pg — ABNORMAL LOW (ref 26.0–34.0)
MCHC: 28.5 g/dL — ABNORMAL LOW (ref 30.0–36.0)
MCV: 88.1 fL (ref 80.0–100.0)
Monocytes Absolute: 1.5 K/uL — ABNORMAL HIGH (ref 0.1–1.0)
Monocytes Relative: 12 %
Neutro Abs: 8.5 K/uL — ABNORMAL HIGH (ref 1.7–7.7)
Neutrophils Relative %: 71 %
Platelets: 365 K/uL (ref 150–400)
RBC: 5.3 MIL/uL — ABNORMAL HIGH (ref 3.87–5.11)
RDW: 24.5 % — ABNORMAL HIGH (ref 11.5–15.5)
Smear Review: NORMAL
WBC: 12.2 K/uL — ABNORMAL HIGH (ref 4.0–10.5)
nRBC: 1 % — ABNORMAL HIGH (ref 0.0–0.2)

## 2024-04-04 LAB — COMPREHENSIVE METABOLIC PANEL WITH GFR
ALT: 122 U/L — ABNORMAL HIGH (ref 0–44)
AST: 230 U/L — ABNORMAL HIGH (ref 15–41)
Albumin: 3.5 g/dL (ref 3.5–5.0)
Alkaline Phosphatase: 113 U/L (ref 38–126)
Anion gap: 6 (ref 5–15)
BUN: 5 mg/dL — ABNORMAL LOW (ref 6–20)
CO2: 37 mmol/L — ABNORMAL HIGH (ref 22–32)
Calcium: 9 mg/dL (ref 8.9–10.3)
Chloride: 100 mmol/L (ref 98–111)
Creatinine, Ser: 0.95 mg/dL (ref 0.44–1.00)
GFR, Estimated: >60 mL/min
Glucose, Bld: 102 mg/dL — ABNORMAL HIGH (ref 70–99)
Potassium: 5 mmol/L (ref 3.5–5.1)
Sodium: 143 mmol/L (ref 135–145)
Total Bilirubin: 0.8 mg/dL (ref 0.0–1.2)
Total Protein: 7.6 g/dL (ref 6.5–8.1)

## 2024-04-04 LAB — LIPASE, BLOOD: Lipase: <10 U/L — ABNORMAL LOW (ref 11–51)

## 2024-04-04 MED ORDER — ONDANSETRON HCL 4 MG/2ML IJ SOLN
4.0000 mg | Freq: Once | INTRAMUSCULAR | Status: AC
  Administered 2024-04-04: 4 mg via INTRAVENOUS
  Filled 2024-04-04: qty 2

## 2024-04-04 MED ORDER — ONDANSETRON 4 MG PO TBDP: 4.0000 mg | ORAL_TABLET | Freq: Three times a day (TID) | ORAL | 0 refills | Status: AC | PRN

## 2024-04-04 MED ORDER — LACTATED RINGERS IV BOLUS
1000.0000 mL | Freq: Once | INTRAVENOUS | Status: AC
  Administered 2024-04-04: 1000 mL via INTRAVENOUS

## 2024-04-04 NOTE — ED Triage Notes (Addendum)
 Pt bib EMS from [facility] after staff reported that pt had slid out of bed onto floor. Pt c/o L knee pain from fall. Pt reports that she has vomited x 3 days.

## 2024-04-04 NOTE — ED Provider Notes (Signed)
 " [place] EMERGENCY DEPARTMENT AT [facility] Provider Note   CSN: [id number] Arrival date & time: 04/04/24  9470     Patient presents with: Carla Little Carla Little is a 34 y.o. female.   34 year old female with multiple medical problems that lives in a skilled nursing facility.  Patient presents the ER today after a fall where she slid out of bed landing on her left knee with pain to her left knee.  On arrival here patient states that she is also nauseous and she has been nauseous for multiple days.  She denies fever, diarrhea or emesis.  She denies any other injuries.   Fall       Prior to Admission medications  Medication Sig Start Date End Date Taking? Authorizing Provider  ondansetron  (ZOFRAN -ODT) 4 MG disintegrating tablet Take 1 tablet (4 mg total) by mouth every 8 (eight) hours as needed for vomiting. 04/04/24  Yes Treylan Mcclintock, Selinda, MD  acetaminophen  (TYLENOL ) 325 MG tablet Take 2 tablets (650 mg total) by mouth every 6 (six) hours as needed for mild pain (or Fever >/= 101). 01/26/21   Jens Durand, MD  ascorbic acid  (VITAMIN C ) 500 MG tablet Take 500 mg by mouth daily.    [provider]  buPROPion  (WELLBUTRIN  XL) 300 MG 24 hr tablet Take 300 mg by mouth daily. 12/23/23   [provider]  busPIRone  (BUSPAR ) 10 MG tablet Take 20 mg by mouth 3 (three) times daily.    [provider]  cholecalciferol  (VITAMIN D3) 25 MCG (1000 UNIT) tablet Take 2,000 Units by mouth in the morning and at bedtime.    [provider]  cyanocobalamin  (,VITAMIN B-12,) 1000 MCG/ML injection Inject 1 mL (1,000 mcg total) into the muscle every 30 (thirty) days. 01/29/21   Jens Durand, MD  doxepin  (SINEQUAN ) 25 MG capsule Take 25 mg by mouth at bedtime. 01/06/24   [provider]  fluticasone  (FLONASE ) 50 MCG/ACT nasal spray Place 1 spray into both nostrils daily. 12/26/23   [provider]  folic acid  (FOLVITE ) 1 MG tablet Take 1 tablet (1  mg total) by mouth daily. 01/27/21   Jens Durand, MD  hydrOXYzine  (ATARAX ) 25 MG tablet Take 25 mg by mouth at bedtime.    [provider]  ipratropium-albuterol  (DUONEB) 0.5-2.5 (3) MG/3ML SOLN Take 3 mLs by nebulization 3 (three) times daily. 01/20/24   Vicci Afton CROME, MD  linaclotide  (LINZESS ) 290 MCG CAPS capsule Take 1 capsule (290 mcg total) by mouth daily before breakfast. 04/18/21   Antoinette Doe, MD  loperamide  (IMODIUM ) 2 MG capsule Take 2 mg by mouth every 6 (six) hours as needed for diarrhea or loose stools.    [provider]  Melatonin 5 MG CHEW Chew 10 mg by mouth at bedtime. Goli Dreamy Sleep Gummies 02/06/24   Akula, Vijaya, MD  methocarbamol  (ROBAXIN ) 500 MG tablet Take 1 tablet (500 mg total) by mouth every 6 (six) hours as needed for muscle spasms. 11/28/20   Leotis Bogus, MD  metoprolol  tartrate (LOPRESSOR ) 25 MG tablet Take 0.5 tablets (12.5 mg total) by mouth 2 (two) times daily as needed (HR>120). 02/06/24   Akula, Vijaya, MD  Multiple Vitamin (MULTIVITAMINS PO) Take 1 tablet by mouth daily.    [provider]  naloxone  (NARCAN ) nasal spray 4 mg/0.1 mL Place 1 spray into the nose once as needed (opioid overdose).    [provider]  omeprazole (PRILOSEC) 20 MG capsule Take 20 mg by mouth  2 (two) times daily before a meal.    [provider]  Oxycodone  HCl 10 MG TABS Take 1 tablet (10 mg total) by mouth every 6 (six) hours as needed (pain). 01/20/24   Johnson, Clanford L, MD  polyethylene glycol (MIRALAX  / GLYCOLAX ) 17 g packet Take 17 g by mouth 2 (two) times daily.    [provider]  pregabalin  (LYRICA ) 100 MG capsule Take 100 mg by mouth 3 (three) times daily. 0000, 0800, 1600 12/30/23   [provider]  pregabalin  (LYRICA ) 25 MG capsule Take 25 mg by mouth 3 (three) times daily. 0000, 0800, 1600 12/30/23   [provider]  rivaroxaban  (XARELTO ) 10 MG TABS tablet Take 1 tablet (10 mg total) by  mouth daily for 14 days. 02/06/24 02/20/24  Akula, Vijaya, MD  saccharomyces boulardii (FLORASTOR) 250 MG capsule Take 1 capsule (250 mg total) by mouth 2 (two) times daily. 02/06/24   Akula, Vijaya, MD  senna-docusate (SENOKOT-S) 8.6-50 MG tablet Take 1 tablet by mouth at bedtime. 01/20/24   Johnson, Clanford L, MD  Venlafaxine  HCl 225 MG TB24 Take 1 tablet by mouth daily. 01/11/24   [provider]    Allergies: Azithromycin     Review of Systems  Updated Vital Signs BP (!) 121/95   Pulse 95   Temp 98.1 F (36.7 C) (Oral)   Resp 20   Ht 5' 4 (1.626 m)   Wt (!) 159 kg   LMP  (LMP Unknown)   SpO2 93%   BMI 60.17 kg/m   Physical Exam Vitals and nursing note reviewed.  Constitutional:      Appearance: She is well-developed.  HENT:     Head: Normocephalic and atraumatic.  Eyes:     Pupils: Pupils are equal, round, and reactive to light.  Cardiovascular:     Rate and Rhythm: Normal rate and regular rhythm.  Pulmonary:     Effort: No respiratory distress.     Breath sounds: No stridor.  Abdominal:     General: There is no distension.     Tenderness: There is no abdominal tenderness.  Musculoskeletal:     Cervical back: Normal range of motion.  Skin:    General: Skin is warm and dry.     Comments: No obvious cellulitis to her knee as she is prone to cellulitis.  No obvious derangement on physical exam.  Neurological:     General: No focal deficit present.     Mental Status: She is alert.     (all labs ordered are listed, but only abnormal results are displayed) Labs Reviewed  CBC WITH DIFFERENTIAL/PLATELET - Abnormal; Notable for the following components:      Result Value   WBC 12.2 (*)    RBC 5.30 (*)    HCT 46.7 (*)    MCH 25.1 (*)    MCHC 28.5 (*)    RDW 24.5 (*)    nRBC 1.0 (*)    Neutro Abs 8.5 (*)    Monocytes Absolute 1.5 (*)    All other components within normal limits  COMPREHENSIVE METABOLIC PANEL WITH GFR - Abnormal; Notable for the  following components:   CO2 37 (*)    Glucose, Bld 102 (*)    BUN 5 (*)    AST 230 (*)    ALT 122 (*)    All other components within normal limits  LIPASE, BLOOD - Abnormal; Notable for the following components:   Lipase <10 (*)    All  other components within normal limits    EKG: None  Radiology: DG Knee Complete 4 Views Left Result Date: 04/04/2024 EXAM: CROSSTABLE LATERAL AP AND LATERAL (2 VIEWS) XRAY OF THE LEFT KNEE 04/04/2024 06:56:22 AM COMPARISON: 01/29/2024 CLINICAL HISTORY: 34 year old female. Evaluation for pain after fall. FINDINGS: The views are Crosstable Lateral AP and Lateral, both are somewhat oblique. BONES AND JOINTS: Orthopedic anchors are present in the medial femoral condyle and proximal tibia. Lateral subluxation of the patella is noted. No acute fracture. No significant joint effusion. SOFT TISSUES: Unremarkable. IMPRESSION: 1. Both views are somewhat oblique. 2. Chronic postoperative changes, chronic Lateral patellar subluxation. 3. No acute fracture identified about the left knee. Electronically signed by: Helayne Hurst MD MD 04/04/2024 07:06 AM EST RP Workstation: [id number]     Procedures   Medications Ordered in the ED  ondansetron  (ZOFRAN ) injection 4 mg (4 mg Intravenous Given 04/04/24 0704)  lactated ringers  bolus 1,000 mL (0 mLs Intravenous Stopped 04/04/24 0851)                                    Medical Decision Making Amount and/or Complexity of Data Reviewed Labs: ordered. Radiology: ordered.  Risk Prescription drug management.   Will get knee x-ray to rule out fracture.  Basic labs rule out dehydration give some fluids and Zofran  for I suspect there is anything alarming on this she can probably go back to her facility with some p.o. medications.  Xr viewed and interpreted by myself without obvious fracture but not the most optimal views. Radiology read reviewed unlikely fracture. Liver enzymes slightly elevated, h/o NASH cirrhosis, no  recents to compare. Suspect this is related to the same and can fu outpatient. No vomiting here.   Final diagnoses:  Fall, initial encounter  Elevated liver enzymes    ED Discharge Orders          Ordered    ondansetron  (ZOFRAN -ODT) 4 MG disintegrating tablet  Every 8 hours PRN        04/04/24 0751               Jazilyn Siegenthaler, MD 04/04/24 2312  "

## 2024-04-04 NOTE — ED Notes (Signed)
 Pt has been placed on the transportation list to go back to [facility].

## 2024-04-08 ENCOUNTER — Encounter: Payer: Self-pay | Admitting: Neurology

## 2024-04-08 ENCOUNTER — Ambulatory Visit: Admitting: Neurology

## 2024-04-29 ENCOUNTER — Ambulatory Visit (INDEPENDENT_AMBULATORY_CARE_PROVIDER_SITE_OTHER): Admitting: Orthopaedic Surgery

## 2024-04-29 DIAGNOSIS — M23612 Other spontaneous disruption of anterior cruciate ligament of left knee: Secondary | ICD-10-CM

## 2024-04-29 NOTE — Progress Notes (Signed)
 "                                Post Operative Evaluation    Procedure/Date of Surgery: Right knee ACL reconstruction, PCL reconstruction, posterior lateral counter repair, MPFL reconstruction  Interval History:   Presents today for follow-up status post the above procedure.  She has not being engaged with physical therapy since her most recent hospitalization.  As a result she has not been able to practice standing   PMH/PSH/Family History/Social History/Meds/Allergies:    Past Medical History:  Diagnosis Date   Anxiety    Class 3 obesity (HCC) 12/09/2020   Depression    GERD (gastroesophageal reflux disease)    Guillain Barr syndrome    Nonalcoholic steatohepatitis (NASH) 12/09/2020   Obesity    PUD (peptic ulcer disease) 12/09/2020   Past Surgical History:  Procedure Laterality Date   BIOPSY  11/19/2020   Procedure: BIOPSY;  Surgeon: Kristie Lamprey, MD;  Location: [facility];  Service: Endoscopy;;   ESOPHAGOGASTRODUODENOSCOPY N/A 01/10/2021   normal   ESOPHAGOGASTRODUODENOSCOPY (EGD) WITH PROPOFOL  N/A 11/19/2020   LA Grade B reflux esophagitis, one small non-bleeding gastric ulcer in antrum s/p biopsy. Negative h.pylori.   FLEXIBLE SIGMOIDOSCOPY N/A 01/10/2021   Colonoscopy attempted Oct 2022 by Dr. Elicia but prep was poor. Stool in rectum and rectosigmoid colon.   MEDIAL PATELLOFEMORAL LIGAMENT REPAIR Left 01/14/2024   Procedure: RECONSTRUCTION, LIGAMENT, MEDIAL PATELLOFEMORAL;  Surgeon: Genelle Standing, MD;  Location: [facility] OR;  Service: Orthopedics;  Laterality: Left;   Social History   Socioeconomic History   Marital status: Single    Spouse name: Not on file   Number of children: Not on file   Years of education: Not on file   Highest education level: Not on file  Occupational History   Not on file  Tobacco Use   Smoking status: Never    Passive exposure: Never   Smokeless tobacco: Never  Vaping Use   Vaping status: Never Used  Substance and Sexual  Activity   Alcohol use: Not Currently   Drug use: No   Sexual activity: Not Currently  Other Topics Concern   Not on file  Social History Narrative   Not on file   Social Drivers of Health   Tobacco Use: Low Risk (01/29/2024)   Patient History    Smoking Tobacco Use: Never    Smokeless Tobacco Use: Never    Passive Exposure: Never  Financial Resource Strain: Not on file  Food Insecurity: No Food Insecurity (01/30/2024)   Epic    Worried About Programme Researcher, Broadcasting/film/video in the Last Year: Never true    Ran Out of Food in the Last Year: Never true  Transportation Needs: No Transportation Needs (01/31/2024)   Epic    Lack of Transportation (Medical): No    Lack of Transportation (Non-Medical): No  Physical Activity: Not on file  Stress: Not on file  Social Connections: Not on file  Depression (EYV7-0): Not on file  Alcohol Screen: Not on file  Housing: Low Risk (01/30/2024)   Epic    Unable to Pay for Housing in the Last Year: No    Number of Times Moved in the Last Year: 0    Homeless in the Last Year: No  Utilities: Not At Risk (01/30/2024)   Epic    Threatened with loss of utilities: No  Health Literacy: Not on file   Family History  Problem Relation Age of Onset   Hypertension Other    Colon cancer Maternal Aunt    Colon polyps Neg Hx    Allergies  Allergen Reactions   Azithromycin  Shortness Of Breath and Nausea And Vomiting   Current Outpatient Medications  Medication Sig Dispense Refill   acetaminophen  (TYLENOL ) 325 MG tablet Take 2 tablets (650 mg total) by mouth every 6 (six) hours as needed for mild pain (or Fever >/= 101).     ascorbic acid  (VITAMIN C ) 500 MG tablet Take 500 mg by mouth daily.     buPROPion  (WELLBUTRIN  XL) 300 MG 24 hr tablet Take 300 mg by mouth daily.     busPIRone  (BUSPAR ) 10 MG tablet Take 20 mg by mouth 3 (three) times daily.     cholecalciferol  (VITAMIN D3) 25 MCG (1000 UNIT) tablet Take 2,000 Units by mouth in the morning and at bedtime.      cyanocobalamin  (,VITAMIN B-12,) 1000 MCG/ML injection Inject 1 mL (1,000 mcg total) into the muscle every 30 (thirty) days. 1 mL 0   doxepin  (SINEQUAN ) 25 MG capsule Take 25 mg by mouth at bedtime.     fluticasone  (FLONASE ) 50 MCG/ACT nasal spray Place 1 spray into both nostrils daily.     folic acid  (FOLVITE ) 1 MG tablet Take 1 tablet (1 mg total) by mouth daily.     hydrOXYzine  (ATARAX ) 25 MG tablet Take 25 mg by mouth at bedtime.     ipratropium-albuterol  (DUONEB) 0.5-2.5 (3) MG/3ML SOLN Take 3 mLs by nebulization 3 (three) times daily.     linaclotide  (LINZESS ) 290 MCG CAPS capsule Take 1 capsule (290 mcg total) by mouth daily before breakfast. 30 capsule    loperamide  (IMODIUM ) 2 MG capsule Take 2 mg by mouth every 6 (six) hours as needed for diarrhea or loose stools.     Melatonin 5 MG CHEW Chew 10 mg by mouth at bedtime. Goli Dreamy Sleep Gummies 20 tablet 0   methocarbamol  (ROBAXIN ) 500 MG tablet Take 1 tablet (500 mg total) by mouth every 6 (six) hours as needed for muscle spasms. 20 tablet 0   metoprolol  tartrate (LOPRESSOR ) 25 MG tablet Take 0.5 tablets (12.5 mg total) by mouth 2 (two) times daily as needed (HR>120). 60 tablet 0   Multiple Vitamin (MULTIVITAMINS PO) Take 1 tablet by mouth daily.     naloxone  (NARCAN ) nasal spray 4 mg/0.1 mL Place 1 spray into the nose once as needed (opioid overdose).     omeprazole (PRILOSEC) 20 MG capsule Take 20 mg by mouth 2 (two) times daily before a meal.     ondansetron  (ZOFRAN -ODT) 4 MG disintegrating tablet Take 1 tablet (4 mg total) by mouth every 8 (eight) hours as needed for vomiting. 30 tablet 0   Oxycodone  HCl 10 MG TABS Take 1 tablet (10 mg total) by mouth every 6 (six) hours as needed (pain). 10 tablet 0   polyethylene glycol (MIRALAX  / GLYCOLAX ) 17 g packet Take 17 g by mouth 2 (two) times daily.     pregabalin  (LYRICA ) 100 MG capsule Take 100 mg by mouth 3 (three) times daily. 0000, 0800, 1600     pregabalin  (LYRICA ) 25 MG capsule Take  25 mg by mouth 3 (three) times daily. 0000, 0800, 1600     rivaroxaban  (XARELTO ) 10 MG TABS tablet Take 1 tablet (10 mg total) by mouth daily for 14 days. 14 tablet 0   saccharomyces boulardii (FLORASTOR) 250 MG capsule Take 1 capsule (250 mg total) by mouth  2 (two) times daily. 30 capsule 0   senna-docusate (SENOKOT-S) 8.6-50 MG tablet Take 1 tablet by mouth at bedtime.     Venlafaxine  HCl 225 MG TB24 Take 1 tablet by mouth daily.     No current facility-administered medications for this visit.   No results found.  Review of Systems:   A ROS was performed including pertinent positives and negatives as documented in the HPI.   Musculoskeletal Exam:    There were no vitals taken for this visit. Left knee incisions are well-appearing with some redness and irritation from the sutures but no evidence of infection.  Patella is stable on exam distal neurosensory exams intact  Imaging:      I personally reviewed and interpreted the radiographs.   Assessment:   4 months status post left knee multiligamentous knee reconstruction overall with a stable knee.  She has not been able to engage in physical therapy and I will recommend this at her rehab facility.  I will plan to see her back in 3 months for final check  Plan :    - Return to clinic 12 weeks for reassessment      I personally saw and evaluated the patient, and participated in the management and treatment plan.  Elspeth Parker, MD Attending Physician, Orthopedic Surgery  This document was dictated using Dragon voice recognition software. A reasonable attempt at proof reading has been made to minimize errors.  "

## 2024-06-22 ENCOUNTER — Ambulatory Visit: Admitting: Cardiology

## 2024-07-29 ENCOUNTER — Ambulatory Visit: Admitting: Neurology
